# Patient Record
Sex: Male | Born: 1945
Health system: Southern US, Community
[De-identification: ages and names within clinical notes are randomized; demographics above are authoritative.]

## PROBLEM LIST (undated history)

## (undated) DIAGNOSIS — H409 Unspecified glaucoma: Secondary | ICD-10-CM

## (undated) DIAGNOSIS — M1990 Unspecified osteoarthritis, unspecified site: Secondary | ICD-10-CM

## (undated) DIAGNOSIS — I509 Heart failure, unspecified: Secondary | ICD-10-CM

## (undated) DIAGNOSIS — K222 Esophageal obstruction: Secondary | ICD-10-CM

## (undated) DIAGNOSIS — I700 Atherosclerosis of aorta: Secondary | ICD-10-CM

## (undated) DIAGNOSIS — I340 Nonrheumatic mitral (valve) insufficiency: Secondary | ICD-10-CM

## (undated) DIAGNOSIS — I219 Acute myocardial infarction, unspecified: Secondary | ICD-10-CM

## (undated) HISTORY — DX: Esophageal obstruction: K22.2

## (undated) HISTORY — DX: Unspecified glaucoma: H40.9

## (undated) HISTORY — DX: Atherosclerosis of aorta: I70.0

## (undated) HISTORY — DX: Unspecified osteoarthritis, unspecified site: M19.90

## (undated) HISTORY — DX: Nonrheumatic mitral (valve) insufficiency: I34.0

## (undated) HISTORY — PX: FACIAL FRACTURE SURGERY: SHX1570

## (undated) HISTORY — DX: Heart failure, unspecified: I50.9

## (undated) HISTORY — DX: Acute myocardial infarction, unspecified: I21.9

---

## 2020-03-27 ENCOUNTER — Encounter: Payer: Self-pay | Admitting: Family Medicine

## 2020-03-27 ENCOUNTER — Ambulatory Visit (INDEPENDENT_AMBULATORY_CARE_PROVIDER_SITE_OTHER): Payer: Medicare HMO | Admitting: Family Medicine

## 2020-03-27 ENCOUNTER — Other Ambulatory Visit: Payer: Self-pay

## 2020-03-27 VITALS — BP 106/50 | HR 80 | Temp 97.9°F | Resp 18 | Ht 71.0 in | Wt 233.2 lb

## 2020-03-27 DIAGNOSIS — R5383 Other fatigue: Secondary | ICD-10-CM | POA: Diagnosis not present

## 2020-03-27 DIAGNOSIS — L02419 Cutaneous abscess of limb, unspecified: Secondary | ICD-10-CM | POA: Diagnosis not present

## 2020-03-27 MED ORDER — CEFTRIAXONE SODIUM 1 G IJ SOLR
1.0000 g | Freq: Once | INTRAMUSCULAR | Status: AC
  Administered 2020-03-27: 1 g via INTRAMUSCULAR
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
## 2020-03-28 LAB — COMPREHENSIVE METABOLIC PANEL
ALT: 17 IU/L (ref 0–44)
AST: 18 IU/L (ref 0–40)
Albumin/Globulin Ratio: 1.2 (ref 1.2–2.2)
Albumin: 3.8 g/dL (ref 3.7–4.7)
Alkaline Phosphatase: 87 IU/L (ref 44–121)
BUN/Creatinine Ratio: 19 (ref 10–24)
BUN: 24 mg/dL (ref 8–27)
Bilirubin Total: 1.3 mg/dL — ABNORMAL HIGH (ref 0.0–1.2)
CO2: 21 mmol/L (ref 20–29)
Calcium: 8.8 mg/dL (ref 8.6–10.2)
Chloride: 98 mmol/L (ref 96–106)
Creatinine, Ser: 1.26 mg/dL (ref 0.76–1.27)
GFR calc Af Amer: 65 mL/min/{1.73_m2} (ref >59)
GFR calc non Af Amer: 57 mL/min/{1.73_m2} — ABNORMAL LOW (ref >59)
Globulin, Total: 3.2 g/dL (ref 1.5–4.5)
Glucose: 122 mg/dL — ABNORMAL HIGH (ref 65–99)
Potassium: 4.3 mmol/L (ref 3.5–5.2)
Sodium: 136 mmol/L (ref 134–144)
Total Protein: 7 g/dL (ref 6.0–8.5)

## 2020-03-28 LAB — CBC WITH DIFFERENTIAL/PLATELET
Basophils Absolute: 0.1 10*3/uL (ref 0.0–0.2)
Basos: 0 %
EOS (ABSOLUTE): 0 10*3/uL (ref 0.0–0.4)
Eos: 0 %
Hematocrit: 40 % (ref 37.5–51.0)
Hemoglobin: 13.3 g/dL (ref 13.0–17.7)
Immature Grans (Abs): 0.2 10*3/uL — ABNORMAL HIGH (ref 0.0–0.1)
Immature Granulocytes: 1 %
Lymphocytes Absolute: 0.5 10*3/uL — ABNORMAL LOW (ref 0.7–3.1)
Lymphs: 2 %
MCH: 29.9 pg (ref 26.6–33.0)
MCHC: 33.3 g/dL (ref 31.5–35.7)
MCV: 90 fL (ref 79–97)
Monocytes Absolute: 1.7 10*3/uL — ABNORMAL HIGH (ref 0.1–0.9)
Monocytes: 8 %
Neutrophils Absolute: 18.4 10*3/uL — ABNORMAL HIGH (ref 1.4–7.0)
Neutrophils: 89 %
Platelets: 294 10*3/uL (ref 150–450)
RBC: 4.45 x10E6/uL (ref 4.14–5.80)
RDW: 14.1 % (ref 11.6–15.4)
WBC: 20.9 10*3/uL (ref 3.4–10.8)

## 2020-03-28 LAB — TSH: TSH: 1.1 u[IU]/mL (ref 0.450–4.500)

## 2020-08-22 ENCOUNTER — Ambulatory Visit (INDEPENDENT_AMBULATORY_CARE_PROVIDER_SITE_OTHER): Payer: Medicare HMO | Admitting: Nurse Practitioner

## 2020-08-22 ENCOUNTER — Other Ambulatory Visit: Payer: Self-pay

## 2020-08-22 VITALS — BP 158/84 | HR 85 | Temp 97.4°F | Ht 74.0 in | Wt 233.0 lb

## 2020-08-22 DIAGNOSIS — M5441 Lumbago with sciatica, right side: Secondary | ICD-10-CM | POA: Diagnosis not present

## 2020-08-22 DIAGNOSIS — M545 Low back pain, unspecified: Secondary | ICD-10-CM | POA: Diagnosis not present

## 2020-08-22 DIAGNOSIS — R809 Proteinuria, unspecified: Secondary | ICD-10-CM | POA: Diagnosis not present

## 2020-08-22 DIAGNOSIS — R109 Unspecified abdominal pain: Secondary | ICD-10-CM | POA: Diagnosis not present

## 2020-08-22 LAB — POCT URINALYSIS DIPSTICK
Bilirubin, UA: NEGATIVE
Blood, UA: NEGATIVE
Glucose, UA: NEGATIVE
Ketones, UA: NEGATIVE
Leukocytes, UA: NEGATIVE
Nitrite, UA: NEGATIVE
Protein, UA: POSITIVE — AB
Spec Grav, UA: 1.025 (ref 1.010–1.025)
Urobilinogen, UA: NEGATIVE E.U./dL — AB
pH, UA: 6 (ref 5.0–8.0)

## 2020-08-22 MED ORDER — PREDNISONE 20 MG PO TABS: 20.0000 mg | ORAL_TABLET | Freq: Every day | ORAL | 0 refills | Status: DC

## 2020-08-22 MED ORDER — CYCLOBENZAPRINE HCL 10 MG PO TABS: 10.0000 mg | ORAL_TABLET | Freq: Three times a day (TID) | ORAL | 0 refills | Status: DC | PRN

## 2020-08-22 MED ORDER — KETOROLAC TROMETHAMINE 60 MG/2ML IM SOLN
60.0000 mg | Freq: Once | INTRAMUSCULAR | Status: AC
  Administered 2020-08-22: 60 mg via INTRAMUSCULAR
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
## 2020-08-22 NOTE — Patient Instructions (Addendum)
Toradol 60 mg injection given in office today Begin Prednisone as directed (Take 1 pill 3 times daily for 3 days, then 1 pill 2 times daily for 3 days, then 1 pill 3 times daily for 3 days) Take Flexeril  (muscle relaxers) up to 3 times daily as needed Obtain kidney and back x-rays at [facility] today Follow-up in 1 week May take Ibuprofen as needed  Acute Back Pain, Adult Acute back pain is sudden and usually short-lived. It is often caused by an injury to the muscles and tissues in the back. The injury may result from:  A muscle or ligament getting overstretched or torn (strained). Ligaments are tissues that connect bones to each other. Lifting something improperly can cause a back strain.  Wear and tear (degeneration) of the spinal disks. Spinal disks are circular tissue that provide cushioning between the bones of the spine (vertebrae).  Twisting motions, such as while playing sports or doing yard work.  A hit to the back.  Arthritis. You may have a physical exam, lab tests, and imaging tests to find the cause of your pain. Acute back pain usually goes away with rest and home care. Follow these instructions at home: Managing pain, stiffness, and swelling  Treatment may include medicines for pain and inflammation that are taken by mouth or applied to the skin, prescription pain medicine, or muscle relaxants. Take over-the-counter and prescription medicines only as told by your health care provider.  Your health care provider may recommend applying ice during the first 24-48 hours after your pain starts. To do this: ? Put ice in a plastic bag. ? Place a towel between your skin and the bag. ? Leave the ice on for 20 minutes, 2-3 times a day.  If directed, apply heat to the affected area as often as told by your health care provider. Use the heat source that your health care provider recommends, such as a moist heat pack or a heating pad. ? Place a towel between your skin and the heat  source. ? Leave the heat on for 20-30 minutes. ? Remove the heat if your skin turns bright red. This is especially important if you are unable to feel pain, heat, or cold. You have a greater risk of getting burned. Activity  Do not stay in bed. Staying in bed for more than 1-2 days can delay your recovery.  Sit up and stand up straight. Avoid leaning forward when you sit or hunching over when you stand. ? If you work at a desk, sit close to it so you do not need to lean over. Keep your chin tucked in. Keep your neck drawn back, and keep your elbows bent at a 90-degree angle (right angle). ? Sit high and close to the steering wheel when you drive. Add lower back (lumbar) support to your car seat, if needed.  Take short walks on even surfaces as soon as you are able. Try to increase the length of time you walk each day.  Do not sit, drive, or stand in one place for more than 30 minutes at a time. Sitting or standing for long periods of time can put stress on your back.  Do not drive or use heavy machinery while taking prescription pain medicine.  Use proper lifting techniques. When you bend and lift, use positions that put less stress on your back: ? [place] your knees. ? Keep the load close to your body. ? Avoid twisting.  Exercise regularly as told by your  health care provider. Exercising helps your back heal faster and helps prevent back injuries by keeping muscles strong and flexible.  Work with a physical therapist to make a safe exercise program, as recommended by your health care provider. Do any exercises as told by your physical therapist.   Lifestyle  Maintain a healthy weight. Extra weight puts stress on your back and makes it difficult to have good posture.  Avoid activities or situations that make you feel anxious or stressed. Stress and anxiety increase muscle tension and can make back pain worse. Learn ways to manage anxiety and stress, such as through exercise. General  instructions  Sleep on a firm mattress in a comfortable position. Try lying on your side with your knees slightly bent. If you lie on your back, put a pillow under your knees.  Follow your treatment plan as told by your health care provider. This may include: ? Cognitive or behavioral therapy. ? Acupuncture or massage therapy. ? Meditation or yoga. Contact a health care provider if:  You have pain that is not relieved with rest or medicine.  You have increasing pain going down into your legs or buttocks.  Your pain does not improve after 2 weeks.  You have pain at night.  You lose weight without trying.  You have a fever or chills. Get help right away if:  You develop new bowel or bladder control problems.  You have unusual weakness or numbness in your arms or legs.  You develop nausea or vomiting.  You develop abdominal pain.  You feel faint. Summary  Acute back pain is sudden and usually short-lived.  Use proper lifting techniques. When you bend and lift, use positions that put less stress on your back.  Take over-the-counter and prescription medicines and apply heat or ice as directed by your health care provider. This information is not intended to replace advice given to you by your health care provider. Make sure you discuss any questions you have with your health care provider. Document Revised: 02/17/2020 Document Reviewed: 02/17/2020 Elsevier Patient Education  2021 [employer] Back Exercises These exercises help to make your trunk and back strong. They also help to keep the lower back flexible. Doing these exercises can help to prevent back pain or lessen existing pain.  If you have back pain, try to do these exercises 2-3 times each day or as told by your doctor.  As you get better, do the exercises once each day. Repeat the exercises more often as told by your doctor.  To stop back pain from coming back, do the exercises once each day, or as told by  your doctor. Exercises Single knee to chest Do these steps 3-5 times in a row for each leg: 1. Lie on your back on a firm bed or the floor with your legs stretched out. 2. Bring one knee to your chest. 3. Grab your knee or thigh with both hands and hold them it in place. 4. Pull on your knee until you feel a gentle stretch in your lower back or buttocks. 5. Keep doing the stretch for 10-30 seconds. 6. Slowly let go of your leg and straighten it. Pelvic tilt Do these steps 5-10 times in a row: 1. Lie on your back on a firm bed or the floor with your legs stretched out. 2. Bend your knees so they point up to the ceiling. Your feet should be flat on the floor. 3. Tighten your lower belly (abdomen) muscles to press  your lower back against the floor. This will make your tailbone point up to the ceiling instead of pointing down to your feet or the floor. 4. Stay in this position for 5-10 seconds while you gently tighten your muscles and breathe evenly. Cat-cow Do these steps until your lower back bends more easily: 1. Get on your hands and knees on a firm surface. Keep your hands under your shoulders, and keep your knees under your hips. You may put padding under your knees. 2. Let your head hang down toward your chest. Tighten (contract) the muscles in your belly. Point your tailbone toward the floor so your lower back becomes rounded like the back of a cat. 3. Stay in this position for 5 seconds. 4. Slowly lift your head. Let the muscles of your belly relax. Point your tailbone up toward the ceiling so your back forms a sagging arch like the back of a cow. 5. Stay in this position for 5 seconds.   Press-ups Do these steps 5-10 times in a row: 1. Lie on your belly (face-down) on the floor. 2. Place your hands near your head, about shoulder-width apart. 3. While you keep your back relaxed and keep your hips on the floor, slowly straighten your arms to raise the top half of your body and lift  your shoulders. Do not use your back muscles. You may change where you place your hands in order to make yourself more comfortable. 4. Stay in this position for 5 seconds. 5. Slowly return to lying flat on the floor.   Bridges Do these steps 10 times in a row: 1. Lie on your back on a firm surface. 2. Bend your knees so they point up to the ceiling. Your feet should be flat on the floor. Your arms should be flat at your sides, next to your body. 3. Tighten your butt muscles and lift your butt off the floor until your waist is almost as high as your knees. If you do not feel the muscles working in your butt and the back of your thighs, slide your feet 1-2 inches farther away from your butt. 4. Stay in this position for 3-5 seconds. 5. Slowly lower your butt to the floor, and let your butt muscles relax. If this exercise is too easy, try doing it with your arms crossed over your chest.   Belly crunches Do these steps 5-10 times in a row: 1. Lie on your back on a firm bed or the floor with your legs stretched out. 2. Bend your knees so they point up to the ceiling. Your feet should be flat on the floor. 3. Cross your arms over your chest. 4. Tip your chin a little bit toward your chest but do not bend your neck. 5. Tighten your belly muscles and slowly raise your chest just enough to lift your shoulder blades a tiny bit off of the floor. Avoid raising your body higher than that, because it can put too much stress on your low back. 6. Slowly lower your chest and your head to the floor. Back lifts Do these steps 5-10 times in a row: 1. Lie on your belly (face-down) with your arms at your sides, and rest your forehead on the floor. 2. Tighten the muscles in your legs and your butt. 3. Slowly lift your chest off of the floor while you keep your hips on the floor. Keep the back of your head in line with the curve in your back. Look at  the floor while you do this. 4. Stay in this position for 3-5  seconds. 5. Slowly lower your chest and your face to the floor. Contact a doctor if:  Your back pain gets a lot worse when you do an exercise.  Your back pain does not get better 2 hours after you exercise. If you have any of these problems, stop doing the exercises. Do not do them again unless your doctor says it is okay. Get help right away if:  You have sudden, very bad back pain. If this happens, stop doing the exercises. Do not do them again unless your doctor says it is okay. This information is not intended to replace advice given to you by your health care provider. Make sure you discuss any questions you have with your health care provider. Document Revised: 02/18/2018 Document Reviewed: 02/18/2018 Elsevier Patient Education  2021 [employer]. Cyclobenzaprine tablets What is this medicine? CYCLOBENZAPRINE (sye kloe BEN za preen) is a muscle relaxer. It is used to treat muscle pain, spasms, and stiffness. This medicine may be used for other purposes; ask your health care provider or pharmacist if you have questions. COMMON BRAND NAME(S): Fexmid, Flexeril What should I tell my health care provider before I take this medicine? They need to know if you have any of these conditions:  heart disease, irregular heartbeat, or previous heart attack  liver disease  thyroid problem  an unusual or allergic reaction to cyclobenzaprine, tricyclic antidepressants, lactose, other medicines, foods, dyes, or preservatives  pregnant or trying to get pregnant  breast-feeding How should I use this medicine? Take this medicine by mouth with a glass of water. Follow the directions on the prescription label. If this medicine upsets your stomach, take it with food or milk. Take your medicine at regular intervals. Do not take it more often than directed. Talk to your pediatrician regarding the use of this medicine in children. Special care may be needed. Overdosage: If you think you have taken  too much of this medicine contact a poison control center or emergency room at once. NOTE: This medicine is only for you. Do not share this medicine with others. What if I miss a dose? If you miss a dose, take it as soon as you can. If it is almost time for your next dose, take only that dose. Do not take double or extra doses. What may interact with this medicine? Do not take this medicine with any of the following medications:  MAOIs like Carbex, Eldepryl, Marplan, Nardil, and Parnate  narcotic medicines for cough  safinamide This medicine may also interact with the following medications:  alcohol  bupropion  antihistamines for allergy, cough and cold  certain medicines for anxiety or sleep  certain medicines for bladder problems like oxybutynin, tolterodine  certain medicines for depression like amitriptyline, fluoxetine, sertraline  certain medicines for Parkinson's disease like benztropine, trihexyphenidyl  certain medicines for seizures like phenobarbital, primidone  certain medicines for stomach problems like dicyclomine, hyoscyamine  certain medicines for travel sickness like scopolamine  general anesthetics like halothane, isoflurane, methoxyflurane, propofol  ipratropium  local anesthetics like lidocaine, pramoxine, tetracaine  medicines that relax muscles for surgery  narcotic medicines for pain  phenothiazines like chlorpromazine, mesoridazine, prochlorperazine, thioridazine  verapamil This list may not describe all possible interactions. Give your health care provider a list of all the medicines, herbs, non-prescription drugs, or dietary supplements you use. Also tell them if you smoke, drink alcohol, or use illegal drugs. Some items  may interact with your medicine. What should I watch for while using this medicine? Tell your doctor or health care professional if your symptoms do not start to get better or if they get worse. You may get drowsy or dizzy.  Do not drive, use machinery, or do anything that needs mental alertness until you know how this medicine affects you. Do not stand or sit up quickly, especially if you are an older patient. This reduces the risk of dizzy or fainting spells. Alcohol may interfere with the effect of this medicine. Avoid alcoholic drinks. If you are taking another medicine that also causes drowsiness, you may have more side effects. Give your health care provider a list of all medicines you use. Your doctor will tell you how much medicine to take. Do not take more medicine than directed. Call emergency for help if you have problems breathing or unusual sleepiness. Your mouth may get dry. Chewing sugarless gum or sucking hard candy, and drinking plenty of water may help. Contact your doctor if the problem does not go away or is severe. What side effects may I notice from receiving this medicine? Side effects that you should report to your doctor or health care professional as soon as possible:  allergic reactions like skin rash, itching or hives, swelling of the face, lips, or tongue  breathing problems  chest pain  fast, irregular heartbeat  hallucinations  seizures  unusually weak or tired Side effects that usually do not require medical attention (report to your doctor or health care professional if they continue or are bothersome):  headache  nausea, vomiting This list may not describe all possible side effects. Call your doctor for medical advice about side effects. You may report side effects to FDA at 1-800-FDA-1088. Where should I keep my medicine? Keep out of the reach of children. Store at room temperature between 15 and 30 degrees C (59 and 86 degrees F). Keep container tightly closed. Throw away any unused medicine after the expiration date. NOTE: This sheet is a summary. It may not cover all possible information. If you have questions about this medicine, talk to your doctor, pharmacist, or  health care provider.  2021 Elsevier/Gold Standard (2018-04-28 12:49:26) Prednisone tablets What is this medicine? PREDNISONE (PRED ni sone) is a corticosteroid. It is commonly used to treat inflammation of the skin, joints, lungs, and other organs. Common conditions treated include asthma, allergies, and arthritis. It is also used for other conditions, such as blood disorders and diseases of the adrenal glands. This medicine may be used for other purposes; ask your health care provider or pharmacist if you have questions. COMMON BRAND NAME(S): Deltasone, Predone, Sterapred, Sterapred DS What should I tell my health care provider before I take this medicine? They need to know if you have any of these conditions:  Cushing's syndrome  diabetes  glaucoma  heart disease  high blood pressure  infection (especially a virus infection such as chickenpox, cold sores, or herpes)  kidney disease  liver disease  mental illness  myasthenia gravis  osteoporosis  seizures  stomach or intestine problems  thyroid disease  an unusual or allergic reaction to lactose, prednisone, other medicines, foods, dyes, or preservatives  pregnant or trying to get pregnant  breast-feeding How should I use this medicine? Take this medicine by mouth with a glass of water. Follow the directions on the prescription label. Take this medicine with food. If you are taking this medicine once a day, take it in the  morning. Do not take more medicine than you are told to take. Do not suddenly stop taking your medicine because you may develop a severe reaction. Your doctor will tell you how much medicine to take. If your doctor wants you to stop the medicine, the dose may be slowly lowered over time to avoid any side effects. Talk to your pediatrician regarding the use of this medicine in children. Special care may be needed. Overdosage: If you think you have taken too much of this medicine contact a poison  control center or emergency room at once. NOTE: This medicine is only for you. Do not share this medicine with others. What if I miss a dose? If you miss a dose, take it as soon as you can. If it is almost time for your next dose, talk to your doctor or health care professional. You may need to miss a dose or take an extra dose. Do not take double or extra doses without advice. What may interact with this medicine? Do not take this medicine with any of the following medications:  metyrapone  mifepristone This medicine may also interact with the following medications:  aminoglutethimide  amphotericin B  aspirin and aspirin-like medicines  barbiturates  certain medicines for diabetes, like glipizide or glyburide  cholestyramine  cholinesterase inhibitors  cyclosporine  digoxin  diuretics  ephedrine  male hormones, like estrogens and birth control pills  isoniazid  ketoconazole  NSAIDS, medicines for pain and inflammation, like ibuprofen or naproxen  phenytoin  rifampin  toxoids  vaccines  warfarin This list may not describe all possible interactions. Give your health care provider a list of all the medicines, herbs, non-prescription drugs, or dietary supplements you use. Also tell them if you smoke, drink alcohol, or use illegal drugs. Some items may interact with your medicine. What should I watch for while using this medicine? Visit your doctor or health care professional for regular checks on your progress. If you are taking this medicine over a prolonged period, carry an identification card with your name and address, the type and dose of your medicine, and your doctor's name and address. This medicine may increase your risk of getting an infection. Tell your doctor or health care professional if you are around anyone with measles or chickenpox, or if you develop sores or blisters that do not heal properly. If you are going to have surgery, tell your doctor  or health care professional that you have taken this medicine within the last twelve months. Ask your doctor or health care professional about your diet. You may need to lower the amount of salt you eat. This medicine may increase blood sugar. Ask your healthcare provider if changes in diet or medicines are needed if you have diabetes. What side effects may I notice from receiving this medicine? Side effects that you should report to your doctor or health care professional as soon as possible:  allergic reactions like skin rash, itching or hives, swelling of the face, lips, or tongue  changes in emotions or moods  changes in vision  depressed mood  eye pain  fever or chills, cough, sore throat, pain or difficulty passing urine  signs and symptoms of high blood sugar such as being more thirsty or hungry or having to urinate more than normal. You may also feel very tired or have blurry vision.  swelling of ankles, feet Side effects that usually do not require medical attention (report to your doctor or health care professional if they  continue or are bothersome):  confusion, excitement, restlessness  headache  nausea, vomiting  skin problems, acne, thin and shiny skin  trouble sleeping  weight gain This list may not describe all possible side effects. Call your doctor for medical advice about side effects. You may report side effects to FDA at 1-800-FDA-1088. Where should I keep my medicine? Keep out of the reach of children. Store at room temperature between 15 and 30 degrees C (59 and 86 degrees F). Protect from light. Keep container tightly closed. Throw away any unused medicine after the expiration date. NOTE: This sheet is a summary. It may not cover all possible information. If you have questions about this medicine, talk to your doctor, pharmacist, or health care provider.  2021 Elsevier/Gold Standard (2018-02-23 10:54:22)

## 2020-08-23 ENCOUNTER — Other Ambulatory Visit: Payer: Self-pay | Admitting: Nurse Practitioner

## 2020-08-23 ENCOUNTER — Encounter: Payer: Self-pay | Admitting: Nurse Practitioner

## 2020-08-23 DIAGNOSIS — R809 Proteinuria, unspecified: Secondary | ICD-10-CM

## 2020-08-23 LAB — COMPREHENSIVE METABOLIC PANEL
ALT: 11 IU/L (ref 0–44)
AST: 16 IU/L (ref 0–40)
Albumin/Globulin Ratio: 1.6 (ref 1.2–2.2)
Albumin: 4.5 g/dL (ref 3.7–4.7)
Alkaline Phosphatase: 72 IU/L (ref 44–121)
BUN/Creatinine Ratio: 12 (ref 10–24)
BUN: 10 mg/dL (ref 8–27)
Bilirubin Total: 0.5 mg/dL (ref 0.0–1.2)
CO2: 23 mmol/L (ref 20–29)
Calcium: 9.5 mg/dL (ref 8.6–10.2)
Chloride: 102 mmol/L (ref 96–106)
Creatinine, Ser: 0.82 mg/dL (ref 0.76–1.27)
Globulin, Total: 2.9 g/dL (ref 1.5–4.5)
Glucose: 117 mg/dL — ABNORMAL HIGH (ref 65–99)
Potassium: 4 mmol/L (ref 3.5–5.2)
Sodium: 141 mmol/L (ref 134–144)
Total Protein: 7.4 g/dL (ref 6.0–8.5)
eGFR: 93 mL/min/{1.73_m2} (ref >59)

## 2020-08-23 LAB — CBC WITH DIFFERENTIAL/PLATELET
Basophils Absolute: 0 10*3/uL (ref 0.0–0.2)
Basos: 1 %
EOS (ABSOLUTE): 0 10*3/uL (ref 0.0–0.4)
Eos: 1 %
Hematocrit: 42 % (ref 37.5–51.0)
Hemoglobin: 14.3 g/dL (ref 13.0–17.7)
Immature Grans (Abs): 0 10*3/uL (ref 0.0–0.1)
Immature Granulocytes: 0 %
Lymphocytes Absolute: 1.6 10*3/uL (ref 0.7–3.1)
Lymphs: 18 %
MCH: 29.6 pg (ref 26.6–33.0)
MCHC: 34 g/dL (ref 31.5–35.7)
MCV: 87 fL (ref 79–97)
Monocytes Absolute: 0.7 10*3/uL (ref 0.1–0.9)
Monocytes: 7 %
Neutrophils Absolute: 6.4 10*3/uL (ref 1.4–7.0)
Neutrophils: 73 %
Platelets: 303 10*3/uL (ref 150–450)
RBC: 4.83 x10E6/uL (ref 4.14–5.80)
RDW: 14.8 % (ref 11.6–15.4)
WBC: 8.8 10*3/uL (ref 3.4–10.8)
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
## 2020-08-30 ENCOUNTER — Other Ambulatory Visit: Payer: Self-pay

## 2020-08-30 ENCOUNTER — Ambulatory Visit (INDEPENDENT_AMBULATORY_CARE_PROVIDER_SITE_OTHER): Payer: Medicare HMO | Admitting: Nurse Practitioner

## 2020-08-30 ENCOUNTER — Encounter: Payer: Self-pay | Admitting: Nurse Practitioner

## 2020-08-30 VITALS — BP 136/78 | HR 93 | Temp 97.4°F | Ht 71.0 in | Wt 224.0 lb

## 2020-08-30 DIAGNOSIS — M5441 Lumbago with sciatica, right side: Secondary | ICD-10-CM

## 2020-08-30 DIAGNOSIS — M1990 Unspecified osteoarthritis, unspecified site: Secondary | ICD-10-CM | POA: Diagnosis not present

## 2020-08-30 DIAGNOSIS — Z2821 Immunization not carried out because of patient refusal: Secondary | ICD-10-CM

## 2020-08-30 DIAGNOSIS — Z5320 Procedure and treatment not carried out because of patient's decision for unspecified reasons: Secondary | ICD-10-CM | POA: Diagnosis not present

## 2020-08-30 DIAGNOSIS — M25361 Other instability, right knee: Secondary | ICD-10-CM

## 2020-08-30 DIAGNOSIS — M419 Scoliosis, unspecified: Secondary | ICD-10-CM

## 2020-08-30 DIAGNOSIS — I700 Atherosclerosis of aorta: Secondary | ICD-10-CM | POA: Diagnosis not present

## 2020-08-30 DIAGNOSIS — W19XXXA Unspecified fall, initial encounter: Secondary | ICD-10-CM | POA: Diagnosis not present

## 2020-08-30 MED ORDER — MELOXICAM 7.5 MG PO TABS: 7.5000 mg | ORAL_TABLET | Freq: Two times a day (BID) | ORAL | 0 refills | Status: DC
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
## 2020-08-30 NOTE — Patient Instructions (Addendum)
Begin Mobic 7.5 mg twice daily with food We will call you with orthopedic appointment Wear loose pants to orthopedic appointment for evaluation of knee and hip Follow-up in 4-weeks Walk with cane to prevent falling  Scoliosis  Scoliosis is a condition in which the spine curves sideways. Normally, the spine does not curve side-to-side (laterally). With scoliosis, the spine may curve to the left, to the right, or in both directions. The curve of the spine is measured by angles in degrees. Scoliosis can affect people at any age, but it is more common among children and adolescents. What are the causes? The cause of scoliosis is not always known. It may be caused by:  A birth defect.  A disease that can cause problems in the muscles or imbalance of the body, such as cerebral palsy or muscular dystrophy. What are the signs or symptoms? This condition may not cause any symptoms. If you do have symptoms, they may include:  Leaning to one side.  Sunken chest and uneven shoulders.  One side of the body being different or larger than the other side (asymmetry).  An abnormal curve in the back.  Pain, which may limit physical activity.  Shortness of breath.  Bowel or bladder control problems, such as not knowing when you have to go. This can be a sign of nerve damage.   How is this diagnosed? This condition is diagnosed based on:  Your medical history.  Your symptoms.  A physical exam. This may include: ? Examining your nerves, muscles, and reflexes (neurological exam). ? Testing the movement of your spine (range of motion study).  Imaging tests, such as: ? X-rays. ? MRI. How is this treated? Treatment for this condition depends on the severity of the symptoms. Treatment may include:  Observation to make sure that your scoliosis does not get worse (progress). You may need to have regular visits with your health care provider.  A back brace to prevent scoliosis from progressing.  This may be needed during times of fast growth (growth spurts), such as during adolescence.  Medicine to help relieve pain.  Physical therapy.  Surgery.   Follow these instructions at home: If you have a brace:  Wear the brace as told by your health care provider. Remove it only as told by your health care provider.  Loosen the brace if your fingers or toes tingle, become numb, or turn cold and blue.  Keep the brace clean.  If the brace is not waterproof: ? Do not let it get wet. ? Cover it with a watertight covering when you take a bath or a shower. General instructions  Take over-the-counter and prescription medicines only as told by your health care provider.  Donot drive or use heavy machinery while taking prescription pain medicine.  If physical therapy was prescribed, do exercises as instructed.  Before starting any new sports or physical activities, ask your health care provider whether they are safe for you.  Keep all follow-up visits as told by your health care provider. This is important. Contact a health care provider if you have:  Problems with your back brace, such as skin irritation or discomfort.  Back pain that does not get better with medicine. Get help right away if:  Your legs feel weak.  You cannot move your legs.  You cannot control when you urinate or pass stool (loss of bladder or bowel control). Summary  Scoliosis is a condition of having a spine that curves sideways. The spine may  curve to the left, to the right, or in both directions.  This condition may be caused by birth defects or diseases that affect muscles and body balance.  Follow your health care provider's instructions about wearing a brace, doing physical activities, and keeping follow-up visits. This information is not intended to replace advice given to you by your health care provider. Make sure you discuss any questions you have with your health care provider. Document  Revised: 10/26/2017 Document Reviewed: 09/10/2017 Elsevier Patient Education  2021 [employer].   Fall Prevention in the Home, Adult Falls can cause injuries and can happen to people of all ages. There are many things you can do to make your home safe and to help prevent falls. Ask for help when making these changes. What actions can I take to prevent falls? General Instructions  Use good lighting in all rooms. Replace any light bulbs that burn out.  Turn on the lights in dark areas. Use night-lights.  Keep items that you use often in easy-to-reach places. Lower the shelves around your home if needed.  Set up your furniture so you have a clear path. Avoid moving your furniture around.  Do not have throw rugs or other things on the floor that can make you trip.  Avoid walking on wet floors.  If any of your floors are uneven, fix them.  Add color or contrast paint or tape to clearly mark and help you see: ? Grab bars or handrails. ? First and last steps of staircases. ? Where the edge of each step is.  If you use a stepladder: ? Make sure that it is fully opened. Do not climb a closed stepladder. ? Make sure the sides of the stepladder are locked in place. ? Ask someone to hold the stepladder while you use it.  Know where your pets are when moving through your home. What can I do in the bathroom?  Keep the floor dry. Clean up any water on the floor right away.  Remove soap buildup in the tub or shower.  Use nonskid mats or decals on the floor of the tub or shower.  Attach bath mats securely with double-sided, nonslip rug tape.  If you need to sit down in the shower, use a plastic, nonslip stool.  Install grab bars by the toilet and in the tub and shower. Do not use towel bars as grab bars.      What can I do in the bedroom?  Make sure that you have a light by your bed that is easy to reach.  Do not use any sheets or blankets for your bed that hang to the  floor.  Have a firm chair with side arms that you can use for support when you get dressed. What can I do in the kitchen?  Clean up any spills right away.  If you need to reach something above you, use a step stool with a grab bar.  Keep electrical cords out of the way.  Do not use floor polish or wax that makes floors slippery. What can I do with my stairs?  Do not leave any items on the stairs.  Make sure that you have a light switch at the top and the bottom of the stairs.  Make sure that there are handrails on both sides of the stairs. Fix handrails that are broken or loose.  Install nonslip stair treads on all your stairs.  Avoid having throw rugs at the top or bottom  of the stairs.  Choose a carpet that does not hide the edge of the steps on the stairs.  Check carpeting to make sure that it is firmly attached to the stairs. Fix carpet that is loose or worn. What can I do on the outside of my home?  Use bright outdoor lighting.  Fix the edges of walkways and driveways and fix any cracks.  Remove anything that might make you trip as you walk through a door, such as a raised step or threshold.  Trim any bushes or trees on paths to your home.  Check to see if handrails are loose or broken and that both sides of all steps have handrails.  Install guardrails along the edges of any raised decks and porches.  Clear paths of anything that can make you trip, such as tools or rocks.  Have leaves, snow, or ice cleared regularly.  Use sand or salt on paths during winter.  Clean up any spills in your garage right away. This includes grease or oil spills. What other actions can I take?  Wear shoes that: ? Have a low heel. Do not wear high heels. ? Have rubber bottoms. ? Feel good on your feet and fit well. ? Are closed at the toe. Do not wear open-toe sandals.  Use tools that help you move around if needed. These  include: ? Canes. ? Walkers. ? Scooters. ? Crutches.  Review your medicines with your doctor. Some medicines can make you feel dizzy. This can increase your chance of falling. Ask your doctor what else you can do to help prevent falls. Where to find more information  Centers for Disease Control and Prevention, [employer]: FootballExhibition.com.br  [employer] on Aging: https://walker.com/ Contact a doctor if:  You are afraid of falling at home.  You feel weak, drowsy, or dizzy at home.  You fall at home. Summary  There are many simple things that you can do to make your home safe and to help prevent falls.  Ways to make your home safe include removing things that can make you trip and installing grab bars in the bathroom.  Ask for help when making these changes in your home. This information is not intended to replace advice given to you by your health care provider. Make sure you discuss any questions you have with your health care provider. Document Revised: 12/28/2019 Document Reviewed: 12/28/2019 Elsevier Patient Education  2021 [employer]. Journal for Nurse Practitioners, 15(4), [phone]. Retrieved March 15, 2018 from http://clinicalkey.com/nursing">  Knee Exercises Ask your health care provider which exercises are safe for you. Do exercises exactly as told by your health care provider and adjust them as directed. It is normal to feel mild stretching, pulling, tightness, or discomfort as you do these exercises. Stop right away if you feel sudden pain or your pain gets worse. Do not begin these exercises until told by your health care provider. Stretching and range-of-motion exercises These exercises warm up your muscles and joints and improve the movement and flexibility of your knee. These exercises also help to relieve pain and swelling. Knee extension, prone 1. Lie on your abdomen (prone position) on a bed. 2. Place your left / right knee just beyond the edge of the surface so your  knee is not on the bed. You can put a towel under your left / right thigh just above your kneecap for comfort. 3. Relax your leg muscles and allow gravity to straighten your knee (extension). You should feel a  stretch behind your left / right knee. 4. Hold this position for __________ seconds. 5. Scoot up so your knee is supported between repetitions. Repeat __________ times. Complete this exercise __________ times a day. Knee flexion, active 1. Lie on your back with both legs straight. If this causes back discomfort, bend your left / right knee so your foot is flat on the floor. 2. Slowly slide your left / right heel back toward your buttocks. Stop when you feel a gentle stretch in the front of your knee or thigh (flexion). 3. Hold this position for __________ seconds. 4. Slowly slide your left / right heel back to the starting position. Repeat __________ times. Complete this exercise __________ times a day.   Quadriceps stretch, prone 1. Lie on your abdomen on a firm surface, such as a bed or padded floor. 2. Bend your left / right knee and hold your ankle. If you cannot reach your ankle or pant leg, loop a belt around your foot and grab the belt instead. 3. Gently pull your heel toward your buttocks. Your knee should not slide out to the side. You should feel a stretch in the front of your thigh and knee (quadriceps). 4. Hold this position for __________ seconds. Repeat __________ times. Complete this exercise __________ times a day.   Hamstring, supine 1. Lie on your back (supine position). 2. Loop a belt or towel over the ball of your left / right foot. The ball of your foot is on the walking surface, right under your toes. 3. Straighten your left / right knee and slowly pull on the belt to raise your leg until you feel a gentle stretch behind your knee (hamstring). ? Do not let your knee bend while you do this. ? Keep your other leg flat on the floor. 4. Hold this position for __________  seconds. Repeat __________ times. Complete this exercise __________ times a day. Strengthening exercises These exercises build strength and endurance in your knee. Endurance is the ability to use your muscles for a long time, even after they get tired. Quadriceps, isometric This exercise stretches the muscles in front of your thigh (quadriceps) without moving your knee joint (isometric). 1. Lie on your back with your left / right leg extended and your other knee bent. Put a rolled towel or small pillow under your knee if told by your health care provider. 2. Slowly tense the muscles in the front of your left / right thigh. You should see your kneecap slide up toward your hip or see increased dimpling just above the knee. This motion will push the back of the knee toward the floor. 3. For __________ seconds, hold the muscle as tight as you can without increasing your pain. 4. Relax the muscles slowly and completely. Repeat __________ times. Complete this exercise __________ times a day.   Straight leg raises This exercise stretches the muscles in front of your thigh (quadriceps) and the muscles that move your hips (hip flexors). 1. Lie on your back with your left / right leg extended and your other knee bent. 2. Tense the muscles in the front of your left / right thigh. You should see your kneecap slide up or see increased dimpling just above the knee. Your thigh may even shake a bit. 3. Keep these muscles tight as you raise your leg 4-6 inches (10-15 cm) off the floor. Do not let your knee bend. 4. Hold this position for __________ seconds. 5. Keep these muscles tense as you  lower your leg. 6. Relax your muscles slowly and completely after each repetition. Repeat __________ times. Complete this exercise __________ times a day. Hamstring, isometric 1. Lie on your back on a firm surface. 2. Bend your left / right knee about __________ degrees. 3. Dig your left / right heel into the surface as if  you are trying to pull it toward your buttocks. Tighten the muscles in the back of your thighs (hamstring) to "dig" as hard as you can without increasing any pain. 4. Hold this position for __________ seconds. 5. Release the tension gradually and allow your muscles to relax completely for __________ seconds after each repetition. Repeat __________ times. Complete this exercise __________ times a day. Hamstring curls If told by your health care provider, do this exercise while wearing ankle weights. Begin with __________ lb weights. Then increase the weight by 1 lb (0.5 kg) increments. Do not wear ankle weights that are more than __________ lb. 1. Lie on your abdomen with your legs straight. 2. Bend your left / right knee as far as you can without feeling pain. Keep your hips flat against the floor. 3. Hold this position for __________ seconds. 4. Slowly lower your leg to the starting position. Repeat __________ times. Complete this exercise __________ times a day.   Squats This exercise strengthens the muscles in front of your thigh and knee (quadriceps). 1. Stand in front of a table, with your feet and knees pointing straight ahead. You may rest your hands on the table for balance but not for support. 2. Slowly bend your knees and lower your hips like you are going to sit in a chair. ? Keep your weight over your heels, not over your toes. ? Keep your lower legs upright so they are parallel with the table legs. ? Do not let your hips go lower than your knees. ? Do not bend lower than told by your health care provider. ? If your knee pain increases, do not bend as low. 3. Hold the squat position for __________ seconds. 4. Slowly push with your legs to return to standing. Do not use your hands to pull yourself to standing. Repeat __________ times. Complete this exercise __________ times a day. Wall slides This exercise strengthens the muscles in front of your thigh and knee  (quadriceps). 1. Lean your back against a smooth wall or door, and walk your feet out 18-24 inches (46-61 cm) from it. 2. Place your feet hip-width apart. 3. Slowly slide down the wall or door until your knees bend __________ degrees. Keep your knees over your heels, not over your toes. Keep your knees in line with your hips. 4. Hold this position for __________ seconds. Repeat __________ times. Complete this exercise __________ times a day.   Straight leg raises This exercise strengthens the muscles that rotate the leg at the hip and move it away from your body (hip abductors). 1. Lie on your side with your left / right leg in the top position. Lie so your head, shoulder, knee, and hip line up. You may bend your bottom knee to help you keep your balance. 2. Roll your hips slightly forward so your hips are stacked directly over each other and your left / right knee is facing forward. 3. Leading with your heel, lift your top leg 4-6 inches (10-15 cm). You should feel the muscles in your outer hip lifting. ? Do not let your foot drift forward. ? Do not let your knee roll toward the  ceiling. 4. Hold this position for __________ seconds. 5. Slowly return your leg to the starting position. 6. Let your muscles relax completely after each repetition. Repeat __________ times. Complete this exercise __________ times a day.   Straight leg raises This exercise stretches the muscles that move your hips away from the front of the pelvis (hip extensors). 1. Lie on your abdomen on a firm surface. You can put a pillow under your hips if that is more comfortable. 2. Tense the muscles in your buttocks and lift your left / right leg about 4-6 inches (10-15 cm). Keep your knee straight as you lift your leg. 3. Hold this position for __________ seconds. 4. Slowly lower your leg to the starting position. 5. Let your leg relax completely after each repetition. Repeat __________ times. Complete this exercise  __________ times a day. This information is not intended to replace advice given to you by your health care provider. Make sure you discuss any questions you have with your health care provider. Document Revised: 03/16/2018 Document Reviewed: 03/16/2018 Elsevier Patient Education  2021 [employer] Meloxicam tablets What is this medicine? MELOXICAM (mel OX i cam) is a non-steroidal anti-inflammatory drug, also known as an NSAID. It is used to treat pain, inflammation, and swelling. This medicine may be used for other purposes; ask your health care provider or pharmacist if you have questions. COMMON BRAND NAME(S): Mobic What should I tell my health care provider before I take this medicine? They need to know if you have any of these conditions:  asthma (lung or breathing disease)  bleeding disorder  coronary artery bypass graft (CABG) within the past 2 weeks  dehydration  heart attack  heart disease  heart failure  high blood pressure  if you often drink alcohol  kidney disease  liver disease  smoke tobacco cigarettes  stomach bleeding  stomach ulcers, other stomach or intestine problems  take medicines that treat or prevent blood clots  taking other steroids like dexamethasone or prednisone  an unusual or allergic reaction to meloxicam, other medicines, foods, dyes, or preservatives  pregnant or trying to get pregnant  breast-feeding How should I use this medicine? Take this medicine by mouth. Take it as directed on the prescription label at the same time every day. You can take it with or without food. If it upsets your stomach, take it with food. Do not use it more often than directed. There may be unused or extra doses in the bottle after you finish your treatment. Talk to your health care provider if you have questions about your dose. A special MedGuide will be given to you by the pharmacist with each prescription and refill. Be sure to read this information  carefully each time. Talk to your health care provider about the use of this medicine in children. Special care may be needed. Patients over 42 years of age may have a stronger reaction and need a smaller dose. Overdosage: If you think you have taken too much of this medicine contact a poison control center or emergency room at once. NOTE: This medicine is only for you. Do not share this medicine with others. What if I miss a dose? If you miss a dose, take it as soon as you can. If it is almost time for your next dose, take only that dose. Do not take double or extra doses. What may interact with this medicine? Do not take this medicine with any of the following medications:  cidofovir  ketorolac  This medicine may also interact with the following medications:  aspirin and aspirin-like medicines  certain medicines for blood pressure, heart disease, irregular heart beat  certain medicines for depression, anxiety, or psychotic disturbances  certain medicines that treat or prevent blood clots like warfarin, enoxaparin, dalteparin, apixaban, dabigatran, rivaroxaban  cyclosporine  diuretics  fluconazole  lithium  methotrexate  other NSAIDs, medicines for pain and inflammation, like ibuprofen and naproxen  pemetrexed This list may not describe all possible interactions. Give your health care provider a list of all the medicines, herbs, non-prescription drugs, or dietary supplements you use. Also tell them if you smoke, drink alcohol, or use illegal drugs. Some items may interact with your medicine. What should I watch for while using this medicine? Visit your health care provider for regular checks on your progress. Tell your health care provider if your symptoms do not start to get better or if they get worse. Do not take other medicines that contain aspirin, ibuprofen, or naproxen with this medicine. Side effects such as stomach upset, nausea, or ulcers may be more likely to occur.  Many non-prescription medicines contain aspirin, ibuprofen, or naproxen. Always read labels carefully. This medicine can cause serious ulcers and bleeding in the stomach. It can happen with no warning. Smoking, drinking alcohol, older age, and poor health can also increase risks. Call your health care provider right away if you have stomach pain or blood in your vomit or stool. This medicine does not prevent a heart attack or stroke. This medicine may increase the chance of a heart attack or stroke. The chance may increase the longer you use this medicine or if you have heart disease. If you take aspirin to prevent a heart attack or stroke, talk to your health care provider about using this medicine. Alcohol may interfere with the effect of this medicine. Avoid alcoholic drinks. This medicine may cause serious skin reactions. They can happen weeks to months after starting the medicine. Contact your health care provider right away if you notice fevers or flu-like symptoms with a rash. The rash may be red or purple and then turn into blisters or peeling of the skin. Or, you might notice a red rash with swelling of the face, lips or lymph nodes in your neck or under your arms. Talk to your health care provider if you are pregnant before taking this medicine. Taking this medicine between weeks 20 and 30 of pregnancy may harm your unborn baby. Your health care provider will monitor you closely if you need to take it. After 30 weeks of pregnancy, do not take this medicine. You may get drowsy or dizzy. Do not drive, use machinery, or do anything that needs mental alertness until you know how this medicine affects you. Do not stand up or sit up quickly, especially if you are an older patient. This reduces the risk of dizzy or fainting spells. Be careful brushing or flossing your teeth or using a toothpick because you may get an infection or bleed more easily. If you have any dental work done, tell your dentist you  are receiving this medicine. This medicine may make it more difficult to get pregnant. Talk to your health care provider if you are concerned about your fertility. What side effects may I notice from receiving this medicine? Side effects that you should report to your doctor or health care professional as soon as possible:  allergic reactions (skin rash, itching or hives; swelling of the face, lips, or tongue)  bleeding (bloody or black, tarry stools; red or dark brown urine; spitting up blood or brown material that looks like coffee grounds; red spots on the skin; unusual bruising or bleeding from the eyes, gums, or nose)  blood clot (chest pain; shortness of breath; pain, swelling, or warmth in the leg)  general ill feeling or flu-like symptoms  high potassium levels (chest pain; fast, irregular heartbeat; muscle weakness)  kidney injury (trouble passing urine or change in the amount of urine)  light-colored stool  liver injury (dark yellow or brown urine; general ill feeling or flu-like symptoms; loss of appetite, right upper belly pain; unusually weak or tired, yellowing of the eyes or skin)  low red blood cell counts (trouble breathing; feeling faint; lightheaded, falls; unusually weak or tired)  rash, fever, and swollen lymph nodes  redness, blistering, peeling, or loosening of the skin, including inside the mouth  stroke (changes in vision; confusion; trouble speaking or understanding; severe headaches; sudden numbness or weakness of the face, arm or leg; trouble walking; dizziness; loss of balance or coordination) Side effects that usually do not require medical attention (report to your doctor or health care professional if they continue or are bothersome):  constipation  diarrhea  dizziness  gas  headache  nausea, vomiting This list may not describe all possible side effects. Call your doctor for medical advice about side effects. You may report side effects to FDA  at 1-800-FDA-1088. Where should I keep my medicine? Keep out of the reach of children and pets. Store at room temperature between 20 and 25 degrees C (68 and 77 degrees F). Protect from moisture. Keep the container tightly closed. Get rid of any unused medicine after the expiration date. To get rid of medicines that are no longer needed or have expired:  Take the medicine to a medicine take-back program. Check with your pharmacy or law enforcement to find a location.  If you cannot return the medicine, check the label or package insert to see if the medicine should be thrown out in the garbage or flushed down the toilet. If you are not sure, ask your health care provider. If it is safe to put it in the trash, empty the medicine out of the container. Mix the medicine with cat litter, dirt, coffee grounds, or other unwanted substance. Seal the mixture in a bag or container. Put it in the trash. NOTE: This sheet is a summary. It may not cover all possible information. If you have questions about this medicine, talk to your doctor, pharmacist, or health care provider.  2021 Elsevier/Gold Standard (2020-05-07 15:14:36)

## 2020-08-31 ENCOUNTER — Ambulatory Visit: Payer: Medicare HMO

## 2020-08-31 DIAGNOSIS — I700 Atherosclerosis of aorta: Secondary | ICD-10-CM

## 2020-08-31 LAB — PSA: Prostate Specific Ag, Serum: 0.7 ng/mL (ref 0.0–4.0)

## 2020-09-01 LAB — LIPID PANEL
Chol/HDL Ratio: 2.9 ratio (ref 0.0–5.0)
Cholesterol, Total: 162 mg/dL (ref 100–199)
HDL: 56 mg/dL (ref >39)
LDL Chol Calc (NIH): 93 mg/dL (ref 0–99)
Triglycerides: 69 mg/dL (ref 0–149)
VLDL Cholesterol Cal: 13 mg/dL (ref 5–40)

## 2020-09-01 LAB — CARDIOVASCULAR RISK ASSESSMENT

## 2020-09-06 ENCOUNTER — Ambulatory Visit (INDEPENDENT_AMBULATORY_CARE_PROVIDER_SITE_OTHER): Payer: Medicare HMO | Admitting: Nurse Practitioner

## 2020-09-06 ENCOUNTER — Telehealth: Payer: Self-pay

## 2020-09-06 ENCOUNTER — Other Ambulatory Visit: Payer: Self-pay

## 2020-09-06 ENCOUNTER — Encounter: Payer: Self-pay | Admitting: Nurse Practitioner

## 2020-09-06 VITALS — BP 144/90 | HR 83 | Temp 98.7°F | Resp 18 | Ht 71.0 in | Wt 226.4 lb

## 2020-09-06 DIAGNOSIS — R296 Repeated falls: Secondary | ICD-10-CM

## 2020-09-06 DIAGNOSIS — M5441 Lumbago with sciatica, right side: Secondary | ICD-10-CM | POA: Diagnosis not present

## 2020-09-06 DIAGNOSIS — W19XXXA Unspecified fall, initial encounter: Secondary | ICD-10-CM | POA: Diagnosis not present

## 2020-09-06 LAB — POCT URINALYSIS DIP (CLINITEK)
Bilirubin, UA: NEGATIVE
Blood, UA: NEGATIVE
Glucose, UA: NEGATIVE mg/dL
Ketones, POC UA: NEGATIVE mg/dL
Leukocytes, UA: NEGATIVE
Nitrite, UA: NEGATIVE
POC PROTEIN,UA: NEGATIVE
Spec Grav, UA: 1.015 (ref 1.010–1.025)
Urobilinogen, UA: 0.2 E.U./dL
pH, UA: 6.5 (ref 5.0–8.0)

## 2020-09-06 MED ORDER — KETOROLAC TROMETHAMINE 60 MG/2ML IM SOLN
60.0000 mg | Freq: Once | INTRAMUSCULAR | Status: AC
  Administered 2020-09-06: 60 mg via INTRAMUSCULAR

## 2020-09-06 MED ORDER — GABAPENTIN 300 MG PO CAPS: 300.0000 mg | ORAL_CAPSULE | Freq: Every day | ORAL | 0 refills | Status: DC

## 2020-09-06 MED ORDER — TRIAMCINOLONE ACETONIDE 40 MG/ML IJ SUSP
60.0000 mg | Freq: Once | INTRAMUSCULAR | Status: AC
  Administered 2020-09-06: 60 mg via INTRAMUSCULAR

## 2020-09-06 MED ORDER — TRAMADOL HCL 50 MG PO TABS: 50.0000 mg | ORAL_TABLET | Freq: Four times a day (QID) | ORAL | 0 refills | Status: AC | PRN

## 2020-09-06 NOTE — Patient Instructions (Signed)
Toradol 60 mg injection and Kenalog 60 mg injection given in office Tramadol 50 mg every 6 hours as needed for pain Gabapentin 300 mg at bedtime for pain We will call you with order for back MRI Keep follow-up with orthopedic doctor as scheduled Use cane for walking, fall precautions  Acute Back Pain, Adult Acute back pain is sudden and usually short-lived. It is often caused by an injury to the muscles and tissues in the back. The injury may result from:  A muscle or ligament getting overstretched or torn (strained). Ligaments are tissues that connect bones to each other. Lifting something improperly can cause a back strain.  Wear and tear (degeneration) of the spinal disks. Spinal disks are circular tissue that provide cushioning between the bones of the spine (vertebrae).  Twisting motions, such as while playing sports or doing yard work.  A hit to the back.  Arthritis. You may have a physical exam, lab tests, and imaging tests to find the cause of your pain. Acute back pain usually goes away with rest and home care. Follow these instructions at home: Managing pain, stiffness, and swelling  Treatment may include medicines for pain and inflammation that are taken by mouth or applied to the skin, prescription pain medicine, or muscle relaxants. Take over-the-counter and prescription medicines only as told by your health care provider.  Your health care provider may recommend applying ice during the first 24-48 hours after your pain starts. To do this: ? Put ice in a plastic bag. ? Place a towel between your skin and the bag. ? Leave the ice on for 20 minutes, 2-3 times a day.  If directed, apply heat to the affected area as often as told by your health care provider. Use the heat source that your health care provider recommends, such as a moist heat pack or a heating pad. ? Place a towel between your skin and the heat source. ? Leave the heat on for 20-30 minutes. ? Remove the heat  if your skin turns bright red. This is especially important if you are unable to feel pain, heat, or cold. You have a greater risk of getting burned. Activity  Do not stay in bed. Staying in bed for more than 1-2 days can delay your recovery.  Sit up and stand up straight. Avoid leaning forward when you sit or hunching over when you stand. ? If you work at a desk, sit close to it so you do not need to lean over. Keep your chin tucked in. Keep your neck drawn back, and keep your elbows bent at a 90-degree angle (right angle). ? Sit high and close to the steering wheel when you drive. Add lower back (lumbar) support to your car seat, if needed.  Take short walks on even surfaces as soon as you are able. Try to increase the length of time you walk each day.  Do not sit, drive, or stand in one place for more than 30 minutes at a time. Sitting or standing for long periods of time can put stress on your back.  Do not drive or use heavy machinery while taking prescription pain medicine.  Use proper lifting techniques. When you bend and lift, use positions that put less stress on your back: ? [place] your knees. ? Keep the load close to your body. ? Avoid twisting.  Exercise regularly as told by your health care provider. Exercising helps your back heal faster and helps prevent back injuries by keeping muscles  strong and flexible.  Work with a physical therapist to make a safe exercise program, as recommended by your health care provider. Do any exercises as told by your physical therapist.   Lifestyle  Maintain a healthy weight. Extra weight puts stress on your back and makes it difficult to have good posture.  Avoid activities or situations that make you feel anxious or stressed. Stress and anxiety increase muscle tension and can make back pain worse. Learn ways to manage anxiety and stress, such as through exercise. General instructions  Sleep on a firm mattress in a comfortable position. Try  lying on your side with your knees slightly bent. If you lie on your back, put a pillow under your knees.  Follow your treatment plan as told by your health care provider. This may include: ? Cognitive or behavioral therapy. ? Acupuncture or massage therapy. ? Meditation or yoga. Contact a health care provider if:  You have pain that is not relieved with rest or medicine.  You have increasing pain going down into your legs or buttocks.  Your pain does not improve after 2 weeks.  You have pain at night.  You lose weight without trying.  You have a fever or chills. Get help right away if:  You develop new bowel or bladder control problems.  You have unusual weakness or numbness in your arms or legs.  You develop nausea or vomiting.  You develop abdominal pain.  You feel faint. Summary  Acute back pain is sudden and usually short-lived.  Use proper lifting techniques. When you bend and lift, use positions that put less stress on your back.  Take over-the-counter and prescription medicines and apply heat or ice as directed by your health care provider. This information is not intended to replace advice given to you by your health care provider. Make sure you discuss any questions you have with your health care provider. Document Revised: 02/17/2020 Document Reviewed: 02/17/2020 Elsevier Patient Education  2021 [employer].

## 2020-09-06 NOTE — Telephone Encounter (Signed)
Pt wife calling back concerned for pt. Pt is rating pain 9/10.   Spoke w/ Kennon Rounds PA as NP is in room. Advised to make same day appointment. We will discuss with NP to ensure this is correct for pt when out of appointment.   Called wife. Wife VU.   Lorita Officer, CCMA 09/06/20 11:59 AM

## 2020-09-06 NOTE — Telephone Encounter (Signed)
Pt's wife called stating pt is still in bad pain. Pain is still in R hip down leg. Pt has been taking meloxicam twice a day. He tries to walk but can only make it to porch before having to sit back down due to pain. States meloxicam doesn't help. The flexeril helped a little, pt requesting a stronger or different muscle relaxer. Wife is concerned he cant exercise so he wont get better.   Lorita Officer, CCMA 09/06/20 9:08 AM
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
## 2020-09-07 ENCOUNTER — Telehealth: Payer: Self-pay

## 2020-09-07 NOTE — Telephone Encounter (Signed)
Gabapentin can be dosed at a higher dose than 300 mg once daily, but it can make him sleepy.  As long as he is arousable, I would think his sleepiness is due to the gabapentin. I would have him take one in the evening and if he is still having sciatica pain, he can call on Monday and we can try to increase dose. kc

## 2020-09-07 NOTE — Telephone Encounter (Signed)
Pt's wife calling stating at 8:00 pm last night pt took gabapentin 300 mg then went to bed. Woke up around 3:00 am and took another gabapentin 300 mg. He went to bed and is still asleep. Wife is wondering if this is okay. Medication is prescribed to take daily at bedtime.   Lorita Officer, CCMA 09/07/20 9:15 AM

## 2020-09-07 NOTE — Telephone Encounter (Signed)
Called wife. Wife VU and concerned for pt. Wife was grateful for help.  Lorita Officer, [place] 09/07/20 9:53 AM

## 2020-09-10 ENCOUNTER — Other Ambulatory Visit: Payer: Self-pay | Admitting: Nurse Practitioner

## 2020-09-10 DIAGNOSIS — M5441 Lumbago with sciatica, right side: Secondary | ICD-10-CM

## 2020-09-14 LAB — SPECIMEN STATUS REPORT

## 2020-09-14 LAB — HGB A1C W/O EAG: Hgb A1c MFr Bld: 5.7 % — ABNORMAL HIGH (ref 4.8–5.6)

## 2020-10-01 ENCOUNTER — Other Ambulatory Visit: Payer: Self-pay

## 2020-10-01 ENCOUNTER — Ambulatory Visit (INDEPENDENT_AMBULATORY_CARE_PROVIDER_SITE_OTHER): Payer: Medicare HMO | Admitting: Nurse Practitioner

## 2020-10-01 ENCOUNTER — Other Ambulatory Visit: Payer: Self-pay | Admitting: Nurse Practitioner

## 2020-10-01 ENCOUNTER — Encounter: Payer: Self-pay | Admitting: Nurse Practitioner

## 2020-10-01 VITALS — BP 142/82 | HR 66 | Temp 97.7°F | Ht 71.0 in | Wt 215.0 lb

## 2020-10-01 DIAGNOSIS — M1990 Unspecified osteoarthritis, unspecified site: Secondary | ICD-10-CM

## 2020-10-01 DIAGNOSIS — R030 Elevated blood-pressure reading, without diagnosis of hypertension: Secondary | ICD-10-CM

## 2020-10-01 DIAGNOSIS — R7301 Impaired fasting glucose: Secondary | ICD-10-CM | POA: Diagnosis not present

## 2020-10-01 DIAGNOSIS — Z833 Family history of diabetes mellitus: Secondary | ICD-10-CM

## 2020-10-01 DIAGNOSIS — M5441 Lumbago with sciatica, right side: Secondary | ICD-10-CM | POA: Diagnosis not present

## 2020-10-01 LAB — COMPREHENSIVE METABOLIC PANEL
ALT: 16 IU/L (ref 0–44)
AST: 16 IU/L (ref 0–40)
Albumin/Globulin Ratio: 1.4 (ref 1.2–2.2)
Albumin: 4.1 g/dL (ref 3.7–4.7)
Alkaline Phosphatase: 70 IU/L (ref 44–121)
BUN/Creatinine Ratio: 14 (ref 10–24)
BUN: 13 mg/dL (ref 8–27)
Bilirubin Total: 0.4 mg/dL (ref 0.0–1.2)
CO2: 22 mmol/L (ref 20–29)
Calcium: 9.5 mg/dL (ref 8.6–10.2)
Chloride: 106 mmol/L (ref 96–106)
Creatinine, Ser: 0.91 mg/dL (ref 0.76–1.27)
Globulin, Total: 2.9 g/dL (ref 1.5–4.5)
Glucose: 90 mg/dL (ref 65–99)
Potassium: 4.9 mmol/L (ref 3.5–5.2)
Sodium: 143 mmol/L (ref 134–144)
Total Protein: 7 g/dL (ref 6.0–8.5)
eGFR: 89 mL/min/{1.73_m2} (ref >59)

## 2020-10-01 MED ORDER — MELOXICAM 7.5 MG PO TABS: 7.5000 mg | ORAL_TABLET | Freq: Two times a day (BID) | ORAL | 0 refills | Status: DC

## 2020-10-01 NOTE — Patient Instructions (Addendum)
Continue prescribed medications Low carb, low sugar diet Keep scheduled appointment with orthopedic doctor this week Take Mobic 7.5 mg twice daily Monitor BP Follow-up in 3 months, fasting  Preventing Hypertension Hypertension, also called high blood pressure, is when the force of blood pumping through the arteries is too strong. Arteries are blood vessels that carry blood from the heart throughout the body. Often, hypertension does not cause symptoms until blood pressure is very high. It is important to have your blood pressure checked regularly. Diet and lifestyle changes can help you prevent hypertension, and they may make you feel better overall and improve your quality of life. If you already have hypertension, you may control it with diet and lifestyle changes, as well as with medicine. How can this condition affect me? Over time, hypertension can damage the arteries and decrease blood flow to important parts of the body, including the brain, heart, and kidneys. By keeping your blood pressure in a healthy range, you can help prevent complications like heart attack, heart failure, stroke, kidney failure, and vascular dementia. What can increase my risk?  Being an older adult. Older people are more often affected.  Having family members who have had high blood pressure.  Being obese.  Being male. Males are more likely to have high blood pressure.  Drinking too much alcohol or caffeine.  Smoking or using illegal drugs.  Taking certain medicines, such as antidepressants, decongestants, birth control pills, and NSAIDs, such as ibuprofen.  Having thyroid problems.  Having certain tumors. What actions can I take to prevent or manage this condition? Work with your health care provider to make a hypertension prevention plan that works for you. Follow your plan and keep all follow-up visits as told by your health care provider. Diet changes Maintain a healthy diet. This  includes:  Eating less salt (sodium). Ask your health care provider how much sodium is safe for you to have. The general recommendation is to have less than 1 tsp (2,300 mg) of sodium a day. ? Do not add salt to your food. ? Choose low-sodium options when grocery shopping and eating out.  Limiting fats in your diet. You can do this by eating low-fat or fat-free dairy products and by eating less red meat.  Eating more fruits, vegetables, and whole grains. Make a goal to eat: ? 1-2 cups of fresh fruits and vegetables each day. ? 3-4 servings of whole grains each day.  Avoiding foods and beverages that have added sugars.  Eating fish that contain healthy fats (omega-3 fatty acids), such as mackerel or salmon. If you need help putting together a healthy eating plan, try the DASH diet. This diet is high in fruits, vegetables, and whole grains. It is low in sodium, red meat, and added sugars. DASH stands for Dietary Approaches to Stop Hypertension.   Lifestyle changes Lose weight if you are overweight. Losing just 3?5% of your body weight can help prevent or control hypertension. For example, if your present weight is 200 lb (91 kg), a loss of 3-5% of your weight means losing 6-10 lb (2.7-4.5 kg). Ask your health care provider to help you with a diet and exercise plan to safely lose weight. Other recommendations usually include:  Get enough exercise. Do at least 150 minutes of moderate-intensity exercise each week. You could do this in short exercise sessions several times a day, or you could do longer exercise sessions a few times a week. For example, you could take a brisk 10-minute walk  or bike ride, 3 times a day, for 5 days a week.  Find ways to reduce stress, such as exercising, meditating, listening to music, or taking a yoga class. If you need help reducing stress, ask your health care provider.  Do not use any products that contain nicotine or tobacco, such as cigarettes, e-cigarettes,  and chewing tobacco. If you need help quitting, ask your health care provider. Chemicals in tobacco and nicotine products raise your blood pressure each time you use them. If you need help quitting, ask your health care provider.  Learn how to check your blood pressure at home. Make sure that you know your personal target blood pressure, as told by your health care provider.  Try to sleep 7-9 hours per night.   Alcohol use  Do not drink alcohol if: ? Your health care provider tells you not to drink. ? You are pregnant, may be pregnant, or are planning to become pregnant.  If you drink alcohol: ? Limit how much you use to:  0-1 drink a day for women.  0-2 drinks a day for men. ? Be aware of how much alcohol is in your drink. In the [place] one drink equals one 12 oz bottle of beer (355 mL), one 5 oz glass of wine (148 mL), or one 1 oz glass of hard liquor (44 mL). Medicines In addition to diet and lifestyle changes, your health care provider may recommend medicines to help lower your blood pressure. In general:  You may need to try a few different medicines to find what works best for you.  You may need to take more than one medicine.  Take over-the-counter and prescription medicines only as told by your health care provider. Questions to ask your health care provider  What is my blood pressure goal?  How can I lower my risk for high blood pressure?  How should I monitor my blood pressure at home? Where to find support Your health care provider can help you prevent hypertension and help you keep your blood pressure at a healthy level. Your local hospital or your community may also provide support services and prevention programs. The American Heart Association offers an online support network at supportnetwork.heart.org Where to find more information Learn more about hypertension from:  National Heart, Lung, and Blood Institute: PopSteam.is  Centers for Disease Control  and Prevention: FootballExhibition.com.br  American Academy of Family Physicians: familydoctor.org Learn more about the DASH diet from:  National Heart, Lung, and Blood Institute: PopSteam.is Contact a health care provider if:  You think you are having a reaction to medicines you have taken.  You have recurrent headaches or feel dizzy.  You have swelling in your ankles.  You have trouble with your vision. Get help right away if:  You have sudden, severe chest, back, or abdominal pain or discomfort.  You have shortness of breath.  You have a sudden, severe headache. These symptoms may represent a serious problem that is an emergency. Do not wait to see if the symptoms will go away. Get medical help right away. Call your local emergency services (911 in the [place] Do not drive yourself to the hospital.  Summary  Hypertension often does not cause any symptoms until blood pressure is very high. It is important to get your blood pressure checked regularly.  Diet and lifestyle changes are important steps in preventing hypertension.  By keeping your blood pressure in a healthy range, you may prevent complications like heart attack, heart failure, stroke,  and kidney failure.  Work with your health care provider to make a hypertension prevention plan that works for you. This information is not intended to replace advice given to you by your health care provider. Make sure you discuss any questions you have with your health care provider. Document Revised: 04/26/2019 Document Reviewed: 04/26/2019 Elsevier Patient Education  2021 Elsevier Inc.  Follow-up 45-months  Prediabetes Eating Plan Prediabetes is a condition that causes blood sugar (glucose) levels to be higher than normal. This increases the risk for developing type 2 diabetes (type 2 diabetes mellitus). Working with a health care provider or nutrition specialist (dietitian) to make diet and lifestyle changes can help prevent the onset of  diabetes. These changes may help you:  Control your blood glucose levels.  Improve your cholesterol levels.  Manage your blood pressure. What are tips for following this plan? Reading food labels  Read food labels to check the amount of fat, salt (sodium), and sugar in prepackaged foods. Avoid foods that have: ? Saturated fats. ? Trans fats. ? Added sugars.  Avoid foods that have more than 300 milligrams (mg) of sodium per serving. Limit your sodium intake to less than 2,300 mg each day. Shopping  Avoid buying pre-made and processed foods.  Avoid buying drinks with added sugar. Cooking  Cook with olive oil. Do not use butter, lard, or ghee.  Bake, broil, grill, steam, or boil foods. Avoid frying. Meal planning  Work with your dietitian to create an eating plan that is right for you. This may include tracking how many calories you take in each day. Use a food diary, notebook, or mobile application to track what you eat at each meal.  Consider following a Mediterranean diet. This includes: ? Eating several servings of fresh fruits and vegetables each day. ? Eating fish at least twice a week. ? Eating one serving each day of whole grains, beans, nuts, and seeds. ? Using olive oil instead of other fats. ? Limiting alcohol. ? Limiting red meat. ? Using nonfat or low-fat dairy products.  Consider following a plant-based diet. This includes dietary choices that focus on eating mostly vegetables and fruit, grains, beans, nuts, and seeds.  If you have high blood pressure, you may need to limit your sodium intake or follow a diet such as the DASH (Dietary Approaches to Stop Hypertension) eating plan. The DASH diet aims to lower high blood pressure.   Lifestyle  Set weight loss goals with help from your health care team. It is recommended that most people with prediabetes lose 7% of their body weight.  Exercise for at least 30 minutes 5 or more days a week.  Attend a support  group or seek support from a mental health counselor.  Take over-the-counter and prescription medicines only as told by your health care provider. What foods are recommended? Fruits Berries. Bananas. Apples. Oranges. Grapes. Papaya. Mango. Pomegranate. Kiwi. Grapefruit. Cherries. Vegetables Lettuce. Spinach. Peas. Beets. Cauliflower. Cabbage. Broccoli. Carrots. Tomatoes. Squash. Eggplant. Herbs. Peppers. Onions. Cucumbers. Brussels sprouts. Grains Whole grains, such as whole-wheat or whole-grain breads, crackers, cereals, and pasta. Unsweetened oatmeal. Bulgur. Barley. Quinoa. Brown rice. Corn or whole-wheat flour tortillas or taco shells. Meats and other proteins Seafood. Poultry without skin. Lean cuts of pork and beef. Tofu. Eggs. Nuts. Beans. Dairy Low-fat or fat-free dairy products, such as yogurt, cottage cheese, and cheese. Beverages Water. Tea. Coffee. Sugar-free or diet soda. Seltzer water. Low-fat or nonfat milk. Milk alternatives, such as soy or almond milk. Fats  and oils Olive oil. Canola oil. Sunflower oil. Grapeseed oil. Avocado. Walnuts. Sweets and desserts Sugar-free or low-fat pudding. Sugar-free or low-fat ice cream and other frozen treats. Seasonings and condiments Herbs. Sodium-free spices. Mustard. Relish. Low-salt, low-sugar ketchup. Low-salt, low-sugar barbecue sauce. Low-fat or fat-free mayonnaise. The items listed above may not be a complete list of recommended foods and beverages. Contact a dietitian for more information. What foods are not recommended? Fruits Fruits canned with syrup. Vegetables Canned vegetables. Frozen vegetables with butter or cream sauce. Grains Refined white flour and flour products, such as bread, pasta, snack foods, and cereals. Meats and other proteins Fatty cuts of meat. Poultry with skin. Breaded or fried meat. Processed meats. Dairy Full-fat yogurt, cheese, or milk. Beverages Sweetened drinks, such as iced tea and soda. Fats  and oils Butter. Lard. Ghee. Sweets and desserts Baked goods, such as cake, cupcakes, pastries, cookies, and cheesecake. Seasonings and condiments Spice mixes with added salt. Ketchup. Barbecue sauce. Mayonnaise. The items listed above may not be a complete list of foods and beverages that are not recommended. Contact a dietitian for more information. Where to find more information  American Diabetes Association: www.diabetes.org Summary  You may need to make diet and lifestyle changes to help prevent the onset of diabetes. These changes can help you control blood sugar, improve cholesterol levels, and manage blood pressure.  Set weight loss goals with help from your health care team. It is recommended that most people with prediabetes lose 7% of their body weight.  Consider following a Mediterranean diet. This includes eating plenty of fresh fruits and vegetables, whole grains, beans, nuts, seeds, fish, and low-fat dairy, and using olive oil instead of other fats. This information is not intended to replace advice given to you by your health care provider. Make sure you discuss any questions you have with your health care provider. Document Revised: 08/25/2019 Document Reviewed: 08/25/2019 Elsevier Patient Education  2021 Elsevier Inc. Hyperglycemia Hyperglycemia is when the sugar (glucose) level in your blood is too high. High blood sugar can happen to people who have or do not have diabetes. High blood sugar can happen quickly. It can be an emergency. What are the causes? If you have diabetes, high blood sugar may be caused by:  Medicines that increase blood sugar or affect your control of diabetes.  Getting less physical activity.  Overeating.  Being sick or injured or having an infection.  Having surgery.  Stress.  Not giving yourself enough insulin (if you are taking it). You may have high blood sugar because you have diabetes that has not been diagnosed yet. If you do  not have diabetes, high blood sugar may be caused by:  Certain medicines.  Stress.  A bad illness.  An infection.  Having surgery.  Diseases of the pancreas. What increases the risk? This condition is more likely to develop in people who have risk factors for diabetes, such as:  Having a family member with diabetes.  Certain conditions in which the body's defense system (immune system) attacks itself. These are called autoimmune disorders.  Being overweight.  Not being active.  Having a condition called insulin resistance.  Having a history of: ? Prediabetes. ? Diabetes when pregnant. ? Polycystic ovarian syndrome (PCOS). What are the signs or symptoms? This condition may not cause symptoms. If you do have symptoms, they may include:  Feeling more thirsty than normal.  Needing to pee (urinate) more often than normal.  Hunger.  Feeling very tired.  Blurry eyesight (vision). You may get other symptoms as the condition gets worse, such as:  Dry mouth.  Pain in your belly (abdomen).  Not being hungry (loss of appetite).  Breath that smells fruity.  Weakness.  Weight loss that is not planned.  A tingling or numb feeling in your hands or feet.  A headache.  Cuts or bruises that heal slowly. How is this treated? Treatment depends on the cause of your condition. Treatment may include:  Taking medicine to control your blood sugar levels.  Changing your medicine or dosage if you take insulin or other diabetes medicines.  Lifestyle changes. These may include: ? Exercising more. ? Eating healthier foods. ? Losing weight.  Treating an illness or infection.  Checking your blood sugar more often.  Stopping or reducing steroid medicines. If your condition gets very bad, you will need to be treated in the hospital. Follow these instructions at home: General instructions  Take over-the-counter and prescription medicines only as told by your  doctor.  Do not smoke or use any products that contain nicotine or tobacco. If you need help quitting, ask your doctor.  If you drink alcohol: ? Limit how much you have to:  0-1 drink a day for women who are not pregnant.  0-2 drinks a day for men. ? Know how much alcohol is in a drink. In the [place] one drink equals one 12 oz bottle of beer (355 mL), one 5 oz glass of wine (148 mL), or one 1 oz glass of hard liquor (44 mL).  Manage stress. If you need help with this, ask your doctor.  Do exercises as told by your doctor.  Keep all follow-up visits. Eating and drinking  Stay at a healthy weight.  Make sure you drink enough fluid when you: ? Exercise. ? Get sick. ? Are in hot temperatures.  Drink enough fluid to keep your pee (urine) pale yellow.   If you have diabetes:  Know the symptoms of high blood sugar.  Follow your diabetes management plan as told by your doctor. Make sure you: ? Take insulin and medicines as told. ? Follow your exercise plan. ? Follow your meal plan. Eat on time. Do not skip meals. ? Check your blood sugar as often as told. Make sure you check before and after exercise. If you exercise longer or in a different way, check your blood sugar more often. ? Follow your sick day plan whenever you cannot eat or drink normally. Make this plan ahead of time with your doctor.  Share your diabetes management plan with people in your workplace, school, and household.  Check your pee for ketones when you are ill and as told by your doctor.  Carry a card or wear jewelry that says that you have diabetes.   Where to find more information American Diabetes Association: www.diabetes.org Contact a doctor if:  Your blood sugar level is at or above 240 mg/dL (98.9 mmol/L) for 2 days in a row.  You have problems keeping your blood sugar in your target range.  You have high blood pressure often.  You have signs of illness, such as: ? Feeling like you may vomit  (feeling nauseous). ? Vomiting. ? A fever. Get help right away if:  Your blood sugar monitor reads "high" even when you are taking insulin.  You have trouble breathing.  You have a change in how you think, feel, or act (mental status).  You feel like you may vomit, and  the feeling does not go away.  You cannot stop vomiting. These symptoms may be an emergency. Get medical help right away. Call your local emergency services (911 in the [place]  Do not wait to see if the symptoms will go away.  Do not drive yourself to the hospital. Summary  Hyperglycemia is when the sugar (glucose) level in your blood is too high.  High blood sugar can happen to people who have or do not have diabetes.  Make sure you drink enough fluids and follow your meal plan. Exercise as often as told by your doctor.  Contact your doctor if you have problems keeping your blood sugar in your target range. This information is not intended to replace advice given to you by your health care provider. Make sure you discuss any questions you have with your health care provider. Document Revised: 03/09/2020 Document Reviewed: 03/09/2020 Elsevier Patient Education  2021 [employer].
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
## 2020-10-05 DIAGNOSIS — M5416 Radiculopathy, lumbar region: Secondary | ICD-10-CM | POA: Diagnosis not present

## 2021-01-01 ENCOUNTER — Ambulatory Visit (INDEPENDENT_AMBULATORY_CARE_PROVIDER_SITE_OTHER): Payer: Medicare HMO | Admitting: Nurse Practitioner

## 2021-01-01 ENCOUNTER — Encounter: Payer: Self-pay | Admitting: Nurse Practitioner

## 2021-01-01 ENCOUNTER — Other Ambulatory Visit: Payer: Self-pay | Admitting: Nurse Practitioner

## 2021-01-01 ENCOUNTER — Other Ambulatory Visit: Payer: Self-pay

## 2021-01-01 VITALS — BP 140/78 | HR 68 | Temp 97.9°F | Ht 71.0 in | Wt 224.0 lb

## 2021-01-01 DIAGNOSIS — R030 Elevated blood-pressure reading, without diagnosis of hypertension: Secondary | ICD-10-CM

## 2021-01-01 DIAGNOSIS — I700 Atherosclerosis of aorta: Secondary | ICD-10-CM | POA: Diagnosis not present

## 2021-01-01 DIAGNOSIS — R7301 Impaired fasting glucose: Secondary | ICD-10-CM | POA: Diagnosis not present

## 2021-01-01 DIAGNOSIS — M5441 Lumbago with sciatica, right side: Secondary | ICD-10-CM

## 2021-01-01 MED ORDER — GABAPENTIN 300 MG PO CAPS
300.0000 mg | ORAL_CAPSULE | Freq: Every day | ORAL | 1 refills | Status: DC
Start: 2021-01-01 — End: 2021-03-22
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
## 2021-01-01 NOTE — Patient Instructions (Signed)
Continue medication We will call you with lab results Monitor BP at home, notify office if above 140/90 Follow-up in 62-months  Preventing Hypertension Hypertension, also called high blood pressure, is when the force of blood pumping through the arteries is too strong. Arteries are blood vessels that carry blood from the heart throughout the body. Often, hypertension does not cause symptoms until blood pressure is very high. It is important to have yourblood pressure checked regularly. Diet and lifestyle changes can help you prevent hypertension, and they may make you feel better overall and improve your quality of life. If you already have hypertension, you may control it with diet and lifestyle changes, as well aswith medicine. How can this condition affect me? Over time, hypertension can damage the arteries and decrease blood flow to important parts of the body, including the brain, heart, and kidneys. By keeping your blood pressure in a healthy range, you can help prevent complications like heart attack, heart failure, stroke, kidney failure, andvascular dementia. What can increase my risk? Being an older adult. Older people are more often affected. Having family members who have had high blood pressure. Being obese. Being male. Males are more likely to have high blood pressure. Drinking too much alcohol or caffeine. Smoking or using illegal drugs. Taking certain medicines, such as antidepressants, decongestants, birth control pills, and NSAIDs, such as ibuprofen. Having thyroid problems. Having certain tumors. What actions can I take to prevent or manage this condition? Work with your health care provider to make a hypertension prevention plan that works for you. Follow your plan and keep all follow-up visits as told by yourhealth care provider. Diet changes Maintain a healthy diet. This includes: Eating less salt (sodium). Ask your health care provider how much sodium is safe for you to  have. The general recommendation is to have less than 1 tsp (2,300 mg) of sodium a day. Do not add salt to your food. Choose low-sodium options when grocery shopping and eating out. Limiting fats in your diet. You can do this by eating low-fat or fat-free dairy products and by eating less red meat. Eating more fruits, vegetables, and whole grains. Make a goal to eat: 1-2 cups of fresh fruits and vegetables each day. 3-4 servings of whole grains each day. Avoiding foods and beverages that have added sugars. Eating fish that contain healthy fats (omega-3 fatty acids), such as mackerel or salmon. If you need help putting together a healthy eating plan, try the DASH diet. This diet is high in fruits, vegetables, and whole grains. It is low in sodium, red meat, and added sugars. DASH stands for Dietary Approaches to StopHypertension. Lifestyle changes  Lose weight if you are overweight. Losing just 3?5% of your body weight can help prevent or control hypertension. For example, if your present weight is 200 lb (91 kg), a loss of 3-5% of your weight means losing 6-10 lb (2.7-4.5 kg). Ask your health care provider to help you with a diet and exercise plan tosafely lose weight. Other recommendations usually include: Get enough exercise. Do at least 150 minutes of moderate-intensity exercise each week. You could do this in short exercise sessions several times a day, or you could do longer exercise sessions a few times a week. For example, you could take a brisk 10-minute walk or bike ride, 3 times a day, for 5 days a week. Find ways to reduce stress, such as exercising, meditating, listening to music, or taking a yoga class. If you need help reducing  stress, ask your health care provider. Do not use any products that contain nicotine or tobacco, such as cigarettes, e-cigarettes, and chewing tobacco. If you need help quitting, ask your health care provider. Chemicals in tobacco and nicotine products raise  your blood pressure each time you use them. If you need help quitting, ask your health care provider. Learn how to check your blood pressure at home. Make sure that you know your personal target blood pressure, as told by your health care provider. Try to sleep 7-9 hours per night.  Alcohol use Do not drink alcohol if: Your health care provider tells you not to drink. You are pregnant, may be pregnant, or are planning to become pregnant. If you drink alcohol: Limit how much you use to: 0-1 drink a day for women. 0-2 drinks a day for men. Be aware of how much alcohol is in your drink. In the [place] one drink equals one 12 oz bottle of beer (355 mL), one 5 oz glass of wine (148 mL), or one 1 oz glass of hard liquor (44 mL). Medicines In addition to diet and lifestyle changes, your health care provider may recommend medicines to help lower your blood pressure. In general: You may need to try a few different medicines to find what works best for you. You may need to take more than one medicine. Take over-the-counter and prescription medicines only as told by your health care provider. Questions to ask your health care provider What is my blood pressure goal? How can I lower my risk for high blood pressure? How should I monitor my blood pressure at home? Where to find support Your health care provider can help you prevent hypertension and help you keep your blood pressure at a healthy level. Your local hospital or your communitymay also provide support services and prevention programs. The American Heart Association offers an online support network at supportnetwork.heart.org Where to find more information Learn more about hypertension from: National Heart, Lung, and Blood Institute: PopSteam.is Centers for Disease Control and Prevention: FootballExhibition.com.br American Academy of Family Physicians: familydoctor.org Learn more about the DASH diet from: National Heart, Lung, and Blood Institute:  PopSteam.is Contact a health care provider if: You think you are having a reaction to medicines you have taken. You have recurrent headaches or feel dizzy. You have swelling in your ankles. You have trouble with your vision. Get help right away if: You have sudden, severe chest, back, or abdominal pain or discomfort. You have shortness of breath. You have a sudden, severe headache. These symptoms may represent a serious problem that is an emergency. Do not wait to see if the symptoms will go away. Get medical help right away. Call your local emergency services (911 in the [place] Do not drive yourself to the hospital. Summary Hypertension often does not cause any symptoms until blood pressure is very high. It is important to get your blood pressure checked regularly. Diet and lifestyle changes are important steps in preventing hypertension. By keeping your blood pressure in a healthy range, you may prevent complications like heart attack, heart failure, stroke, and kidney failure. Work with your health care provider to make a hypertension prevention plan that works for you. This information is not intended to replace advice given to you by your health care provider. Make sure you discuss any questions you have with your healthcare provider. Document Revised: 04/26/2019 Document Reviewed: 04/26/2019 Elsevier Patient Education  2022 [employer] Preventing High Cholesterol Cholesterol is a white, waxy substance  similar to fat that the human body needs to help build cells. The liver makes all the cholesterol that a person's body needs. Having high cholesterol (hypercholesterolemia) increases your risk for heart disease and stroke. Extra or excess cholesterolcomes from the food that you eat. High cholesterol can often be prevented with diet and lifestyle changes. If you already have high cholesterol, you can control it with diet, lifestyle changes,and medicines. How can high cholesterol  affect me? If you have high cholesterol, fatty deposits (plaques) may build up on the walls of your blood vessels. The blood vessels that carry blood away from your heart are called arteries. Plaques make the arteries narrower and stiffer. This in turn can: Restrict or block blood flow and cause blood clots to form. Increase your risk for heart attack and stroke. What can increase my risk for high cholesterol? This condition is more likely to develop in people who: Eat foods that are high in saturated fat or cholesterol. Saturated fat is mostly found in foods that come from animal sources. Are overweight. Are not getting enough exercise. Have a family history of high cholesterol (familial hypercholesterolemia). What actions can I take to prevent this? Nutrition  Eat less saturated fat. Avoid trans fats (partially hydrogenated oils). These are often found in margarine and in some baked goods, fried foods, and snacks bought in packages. Avoid precooked or cured meat, such as bacon, sausages, or meat loaves. Avoid foods and drinks that have added sugars. Eat more fruits, vegetables, and whole grains. Choose healthy sources of protein, such as fish, poultry, lean cuts of red meat, beans, peas, lentils, and nuts. Choose healthy sources of fat, such as: Nuts. Vegetable oils, especially olive oil. Fish that have healthy fats, such as omega-3 fatty acids. These fish include mackerel or salmon.  Lifestyle Lose weight if you are overweight. Maintaining a healthy body mass index (BMI) can help prevent or control high cholesterol. It can also lower your risk for diabetes and high blood pressure. Ask your health care provider to help you with a diet and exercise plan to lose weight safely. Do not use any products that contain nicotine or tobacco, such as cigarettes, e-cigarettes, and chewing tobacco. If you need help quitting, ask your health care provider. Alcohol use Do not drink alcohol if: Your  health care provider tells you not to drink. You are pregnant, may be pregnant, or are planning to become pregnant. If you drink alcohol: Limit how much you use to: 0-1 drink a day for women. 0-2 drinks a day for men. Be aware of how much alcohol is in your drink. In the [place] one drink equals one 12 oz bottle of beer (355 mL), one 5 oz glass of wine (148 mL), or one 1 oz glass of hard liquor (44 mL). Activity  Get enough exercise. Do exercises as told by your health care provider. Each week, do at least 150 minutes of exercise that takes a medium level of effort (moderate-intensity exercise). This kind of exercise: Makes your heart beat faster while allowing you to still be able to talk. Can be done in short sessions several times a day or longer sessions a few times a week. For example, on 5 days each week, you could walk fast or ride your bike 3 times a day for 10 minutes each time.  Medicines Your health care provider may recommend medicines to help lower cholesterol. This may be a medicine to lower the amount of cholesterol that your liver makes.  You may need medicine if: Diet and lifestyle changes have not lowered your cholesterol enough. You have high cholesterol and other risk factors for heart disease or stroke. Take over-the-counter and prescription medicines only as told by your health care provider. General information Manage your risk factors for high cholesterol. Talk with your health care provider about all your risk factors and how to lower your risk. Manage other conditions that you have, such as diabetes or high blood pressure (hypertension). Have blood tests to check your cholesterol levels at regular points in time as told by your health care provider. Keep all follow-up visits as told by your health care provider. This is important. Where to find more information American Heart Association: www.heart.org National Heart, Lung, and Blood Institute:  PopSteam.is Summary High cholesterol increases your risk for heart disease and stroke. By keeping your cholesterol level low, you can reduce your risk for these conditions. High cholesterol can often be prevented with diet and lifestyle changes. Work with your health care provider to manage your risk factors, and have your blood tested regularly. This information is not intended to replace advice given to you by your health care provider. Make sure you discuss any questions you have with your healthcare provider. Document Revised: 03/08/2019 Document Reviewed: 03/08/2019 Elsevier Patient Education  2022 [employer].

## 2021-01-02 LAB — LIPID PANEL
Chol/HDL Ratio: 3.2 ratio (ref 0.0–5.0)
Cholesterol, Total: 191 mg/dL (ref 100–199)
HDL: 59 mg/dL (ref >39)
LDL Chol Calc (NIH): 121 mg/dL — ABNORMAL HIGH (ref 0–99)
Triglycerides: 61 mg/dL (ref 0–149)
VLDL Cholesterol Cal: 11 mg/dL (ref 5–40)

## 2021-01-02 LAB — CARDIOVASCULAR RISK ASSESSMENT

## 2021-01-02 LAB — COMPREHENSIVE METABOLIC PANEL WITH GFR
ALT: 11 IU/L (ref 0–44)
AST: 15 IU/L (ref 0–40)
Albumin/Globulin Ratio: 1.4 (ref 1.2–2.2)
Albumin: 4.2 g/dL (ref 3.7–4.7)
Alkaline Phosphatase: 71 IU/L (ref 44–121)
BUN/Creatinine Ratio: 14 (ref 10–24)
BUN: 11 mg/dL (ref 8–27)
Bilirubin Total: 0.5 mg/dL (ref 0.0–1.2)
CO2: 25 mmol/L (ref 20–29)
Calcium: 9.5 mg/dL (ref 8.6–10.2)
Chloride: 103 mmol/L (ref 96–106)
Creatinine, Ser: 0.81 mg/dL (ref 0.76–1.27)
Globulin, Total: 2.9 g/dL (ref 1.5–4.5)
Glucose: 88 mg/dL (ref 65–99)
Potassium: 5.1 mmol/L (ref 3.5–5.2)
Sodium: 142 mmol/L (ref 134–144)
Total Protein: 7.1 g/dL (ref 6.0–8.5)
eGFR: 93 mL/min/1.73

## 2021-01-02 LAB — CBC WITH DIFFERENTIAL/PLATELET
Basophils Absolute: 0.1 10*3/uL (ref 0.0–0.2)
Basos: 1 %
EOS (ABSOLUTE): 0.3 10*3/uL (ref 0.0–0.4)
Eos: 5 %
Hematocrit: 42 % (ref 37.5–51.0)
Hemoglobin: 14.2 g/dL (ref 13.0–17.7)
Immature Grans (Abs): 0 10*3/uL (ref 0.0–0.1)
Immature Granulocytes: 0 %
Lymphocytes Absolute: 1.4 10*3/uL (ref 0.7–3.1)
Lymphs: 23 %
MCH: 30.2 pg (ref 26.6–33.0)
MCHC: 33.8 g/dL (ref 31.5–35.7)
MCV: 89 fL (ref 79–97)
Monocytes Absolute: 0.4 10*3/uL (ref 0.1–0.9)
Monocytes: 7 %
Neutrophils Absolute: 3.9 10*3/uL (ref 1.4–7.0)
Neutrophils: 64 %
Platelets: 213 10*3/uL (ref 150–450)
RBC: 4.7 x10E6/uL (ref 4.14–5.80)
RDW: 14.3 % (ref 11.6–15.4)
WBC: 6.1 10*3/uL (ref 3.4–10.8)

## 2021-01-02 LAB — HEMOGLOBIN A1C
Est. average glucose Bld gHb Est-mCnc: 117 mg/dL
Hgb A1c MFr Bld: 5.7 % — ABNORMAL HIGH (ref 4.8–5.6)

## 2021-01-08 DIAGNOSIS — H524 Presbyopia: Secondary | ICD-10-CM | POA: Diagnosis not present

## 2021-01-23 DIAGNOSIS — H34831 Tributary (branch) retinal vein occlusion, right eye, with macular edema: Secondary | ICD-10-CM | POA: Diagnosis not present

## 2021-02-03 ENCOUNTER — Telehealth: Payer: Self-pay

## 2021-02-03 NOTE — Telephone Encounter (Signed)
Called pt spoke to his wife Keith Jenkins. Informed her that the pt needs an appt for a AWV. Pts wife stated he was out in the yard and they will give the office a call on Monday 02/04/2021 to set up and appt. Told pts wife the visits are now being offered on the weekend. Pts wife verbalized understanding.  KP

## 2021-02-19 ENCOUNTER — Other Ambulatory Visit: Payer: Self-pay | Admitting: Nurse Practitioner

## 2021-02-19 DIAGNOSIS — M5441 Lumbago with sciatica, right side: Secondary | ICD-10-CM

## 2021-02-19 DIAGNOSIS — M1990 Unspecified osteoarthritis, unspecified site: Secondary | ICD-10-CM

## 2021-02-20 DIAGNOSIS — H34831 Tributary (branch) retinal vein occlusion, right eye, with macular edema: Secondary | ICD-10-CM | POA: Diagnosis not present

## 2021-02-23 ENCOUNTER — Telehealth: Payer: Self-pay

## 2021-02-23 NOTE — Telephone Encounter (Signed)
I talked with patient's wife and she mentioned that patient will call the office to schedule an appointment.

## 2021-03-14 DIAGNOSIS — H401131 Primary open-angle glaucoma, bilateral, mild stage: Secondary | ICD-10-CM | POA: Diagnosis not present

## 2021-03-20 DIAGNOSIS — H34831 Tributary (branch) retinal vein occlusion, right eye, with macular edema: Secondary | ICD-10-CM | POA: Diagnosis not present

## 2021-03-22 ENCOUNTER — Other Ambulatory Visit: Payer: Self-pay | Admitting: Nurse Practitioner

## 2021-03-22 DIAGNOSIS — M5441 Lumbago with sciatica, right side: Secondary | ICD-10-CM

## 2021-04-25 DIAGNOSIS — H34831 Tributary (branch) retinal vein occlusion, right eye, with macular edema: Secondary | ICD-10-CM | POA: Diagnosis not present

## 2021-05-15 DIAGNOSIS — H401131 Primary open-angle glaucoma, bilateral, mild stage: Secondary | ICD-10-CM | POA: Diagnosis not present

## 2021-05-15 DIAGNOSIS — H2513 Age-related nuclear cataract, bilateral: Secondary | ICD-10-CM | POA: Diagnosis not present

## 2021-05-20 ENCOUNTER — Ambulatory Visit (INDEPENDENT_AMBULATORY_CARE_PROVIDER_SITE_OTHER): Payer: Medicare HMO

## 2021-05-20 DIAGNOSIS — Z23 Encounter for immunization: Secondary | ICD-10-CM | POA: Diagnosis not present

## 2021-06-20 DIAGNOSIS — H34831 Tributary (branch) retinal vein occlusion, right eye, with macular edema: Secondary | ICD-10-CM | POA: Diagnosis not present

## 2021-06-21 ENCOUNTER — Other Ambulatory Visit: Payer: Self-pay

## 2021-06-21 DIAGNOSIS — M5441 Lumbago with sciatica, right side: Secondary | ICD-10-CM

## 2021-06-21 DIAGNOSIS — M1990 Unspecified osteoarthritis, unspecified site: Secondary | ICD-10-CM

## 2021-06-22 ENCOUNTER — Other Ambulatory Visit: Payer: Self-pay | Admitting: Nurse Practitioner

## 2021-06-22 DIAGNOSIS — M1990 Unspecified osteoarthritis, unspecified site: Secondary | ICD-10-CM

## 2021-06-22 DIAGNOSIS — M5441 Lumbago with sciatica, right side: Secondary | ICD-10-CM

## 2021-06-22 MED ORDER — GABAPENTIN 300 MG PO CAPS: 300.0000 mg | ORAL_CAPSULE | Freq: Every day | ORAL | 0 refills | Status: DC

## 2021-06-22 MED ORDER — MELOXICAM 7.5 MG PO TABS: ORAL_TABLET | ORAL | 0 refills | Status: DC

## 2021-07-09 ENCOUNTER — Other Ambulatory Visit: Payer: Self-pay

## 2021-07-09 ENCOUNTER — Encounter: Payer: Self-pay | Admitting: Nurse Practitioner

## 2021-07-09 ENCOUNTER — Ambulatory Visit (INDEPENDENT_AMBULATORY_CARE_PROVIDER_SITE_OTHER): Payer: Medicare HMO | Admitting: Nurse Practitioner

## 2021-07-09 VITALS — BP 148/78 | HR 61 | Temp 97.4°F | Ht 74.0 in | Wt 227.0 lb

## 2021-07-09 DIAGNOSIS — N521 Erectile dysfunction due to diseases classified elsewhere: Secondary | ICD-10-CM

## 2021-07-09 DIAGNOSIS — R7303 Prediabetes: Secondary | ICD-10-CM | POA: Diagnosis not present

## 2021-07-09 DIAGNOSIS — R5383 Other fatigue: Secondary | ICD-10-CM | POA: Diagnosis not present

## 2021-07-09 DIAGNOSIS — Z23 Encounter for immunization: Secondary | ICD-10-CM | POA: Diagnosis not present

## 2021-07-09 DIAGNOSIS — R030 Elevated blood-pressure reading, without diagnosis of hypertension: Secondary | ICD-10-CM

## 2021-07-09 DIAGNOSIS — Z1211 Encounter for screening for malignant neoplasm of colon: Secondary | ICD-10-CM | POA: Diagnosis not present

## 2021-07-09 DIAGNOSIS — H4053X1 Glaucoma secondary to other eye disorders, bilateral, mild stage: Secondary | ICD-10-CM | POA: Diagnosis not present

## 2021-07-09 DIAGNOSIS — I700 Atherosclerosis of aorta: Secondary | ICD-10-CM

## 2021-07-09 MED ORDER — SILDENAFIL CITRATE 50 MG PO TABS: 50.0000 mg | ORAL_TABLET | Freq: Every day | ORAL | 0 refills | Status: DC | PRN

## 2021-07-09 MED ORDER — TADALAFIL 10 MG PO TABS: 10.0000 mg | ORAL_TABLET | ORAL | 1 refills | Status: DC | PRN

## 2021-07-09 NOTE — Patient Instructions (Addendum)
We will call you with lab results  Pneumonia vaccine given in-office today Return in 2-weeks for Shingles vaccine, will received 2nd shingles vaccine 2 months later Follow-up 71-months, or sooner if needed   Preventing Osteoarthritis, Adult Osteoarthritis is a type of arthritis that affects tissue that covers the ends of bones in joints (cartilage). Cartilage acts as a cushion between the bones and helps them move smoothly. Osteoarthritis results when cartilage in the joints gets worn down. This causes the joint to break down over time. Osteoarthritis cannot always be prevented, but you can take steps to lower your risk of developing this condition. Once you have osteoarthritis, it does not go away, so lowering your risk is important. How can this condition affect me? Osteoarthritis can affect any joint and can make movement painful. It usually affects the joints of the hands, spine, hips, knees, or toes. The knee joints are most commonly affected. The condition can cause symptoms such as: Pain, tenderness, and stiffness in a joint, especially first thing in the morning or after a period of resting. Trouble moving the affected joint. A grating or scraping feeling inside the joint when using it. This condition can make it harder to do things that you need or want to do each day. Osteoarthritis in a major joint, such as your knee or hip, can make it painful to walk or exercise. If you develop osteoarthritis in your hands, you might not be able to grip items, twist your hand, or control small movements of your hands and fingers. Osteoarthritis is a degenerative disease. This means that it may get worse over time. What can increase my risk? Some factors may make you more likely to develop osteoarthritis, such as: Being 55 years of age or older. Being overweight. Doing activities that put extra stress on your joints. Injuring or overusing a joint. Having previous surgery on a joint. Having a family  history of osteoarthritis. Some risk factors, such as your age and family history, cannot be changed. But you can take steps to control other risk factors and lower your chances of developing osteoarthritis. What actions can I take to prevent this condition? Activity   Stay active. Doing moderate exercise for 30 minutes, 5 times a week will help you maintain a healthy weight and strengthen the muscles that support your joints. Good exercises for preventing osteoarthritis and protecting your joints include low-impact activities, such as walking, swimming, hiking, yoga, and biking. Avoid doing too many high-impact exercises and activities that put extra stress on joints. These include activities that involve heavy lifting, gripping, bending, kneeling, and squatting. Osteoarthritis is much more common in people with jobs that require these activities. If your job involves this type of activity, learn ways to reduce the stress on your joints, such as: Using proper lifting techniques. Taking breaks as needed. Consider working with a Teacher, English as a foreign language when starting a new physical activity or sport. A coach or trainer can help you: Learn proper techniques for the activity. Come up with a training plan that is safe and effective for you. Get treatment for any joint injury. After an injury, do not return to full activity until you have been cleared by your health care provider. An injured joint that does not heal properly is much more likely to develop osteoarthritis. Maintain good posture while standing and sitting. This reduces stress on the joints of your back, neck, and hips. When standing, keep your upper back and neck straight, with your shoulders pulled back. Avoid  slouching. When sitting, keep your back straight and relax your shoulders. Do not round your shoulders or pull them backward. Lifestyle Lose weight if you are overweight. More weight puts more stress on your joints. Control your blood  sugar. Diabetes may increase your risk of osteoarthritis. Have your blood sugar checked to make sure you are not at risk of developing diabetes. If you already have diabetes, work closely with your health care provider to keep your blood sugar at a healthy level. Do not wear high heels. These put stress on your toes, knees, and hips. Do not use any products that contain nicotine or tobacco, such as cigarettes, e-cigarettes, and chewing tobacco. If you need help quitting, ask your health care provider. Nutrition  Eat a healthy diet that includes foods that are high in vitamin C, vitamin D, and omega-3 fatty acids. Get vitamin C from fruits and vegetables. Get vitamin D from fish, eggs, and dairy products. Some foods have vitamin D added to them (are fortified). Look for fortified foods like milk, orange juice, and cereals. Spending some time in the sun also increases vitamin D. Get omega-3 from cold-water fish, such as cod, mackerel, and sardines. Other sources include: Fortified eggs. Soybeans. Nuts, such as walnuts. Flax, chia, and hemp seeds. Check with your health care provider before taking an over-the-counter supplement for bone or joint health, especially if you are taking any medicines. Supplements may interact with some medicines. Also, evidence that taking a supplement will reduce your risk of osteoarthritis is not strong. Where to find more information Arthritis Foundation: www.arthritis.org [employer] of Rheumatology: www.rheumatology.[facility] of Arthritis and Musculoskeletal and Skin Diseases: www.niams.http://www.myers.net/ Contact a health care provider if: You have pain or swelling in a joint. You have joint stiffness or you lose full motion at the joint. Your joint becomes large and knobby. You have cracking or grinding in a joint. You have a joint injury. Summary Osteoarthritis involves the loss of tissue that covers the ends of bones in joints (cartilage). It can  affect any joint and can make movement painful. Osteoarthritis is most common in middle and older age. Osteoarthritis cannot always be prevented, but you can take steps to lower your risk of developing this condition. Eating a healthy diet, losing weight, and staying active may lower your risk of osteoarthritis. Let your health care provider know if you have a joint injury or joint pain. This information is not intended to replace advice given to you by your health care provider. Make sure you discuss any questions you have with your health care provider. Document Revised: 12/06/2018 Document Reviewed: 08/31/2018 Elsevier Patient Education  2022 Elsevier Inc.    Pneumococcal Conjugate Vaccine: What You Need to Know 1. Why get vaccinated? Pneumococcal conjugate vaccine can prevent pneumococcal disease. Pneumococcal disease refers to any illness caused by pneumococcal bacteria. These bacteria can cause many types of illnesses, including pneumonia, which is an infection of the lungs. Pneumococcal bacteria are one of the most common causes of pneumonia. Besides pneumonia, pneumococcal bacteria can also cause: Ear infections Sinus infections Meningitis (infection of the tissue covering the brain and spinal cord) Bacteremia (infection of the blood) Anyone can get pneumococcal disease, but children under 59 years old, people with certain medical conditions or other risk factors, and adults 65 years or older are at the highest risk. Most pneumococcal infections are mild. However, some can result in long-term problems, such as brain damage or hearing loss. Meningitis, bacteremia, and pneumonia caused by pneumococcal  disease can be fatal. 2. Pneumococcal conjugate vaccine Pneumococcal conjugate vaccine helps protect against bacteria that cause pneumococcal disease. There are three pneumococcal conjugate vaccines (PCV13, PCV15, and PCV20). The different vaccines are recommended for different people based  on their age and medical status. PCV13 Infants and young children usually need 4 doses of PCV13, at ages 58, 77, 12, and 12-15 months. Older children (through age 24 months) may be vaccinated with PCV13 if they did not receive the recommended doses. Children and adolescents 38-26 years of age with certain medical conditions should receive a single dose of PCV13 if they did not already receive PCV13. PCV15 or PCV20 Adults 47 through 74 years old with certain medical conditions or other risk factors who have not already received a pneumococcal conjugate vaccine should receive either: a single dose of PCV15 followed by a dose of pneumococcal polysaccharide vaccine (PPSV23), or a single dose of PCV20. Adults 65 years or older who have not already received a pneumococcal conjugate vaccine should receive either: a single dose of PCV15 followed by a dose of PPSV23, or a single dose of PCV20. Your health care provider can give you more information. 3. Talk with your health care provider Tell your vaccination provider if the person getting the vaccine: Has had an allergic reaction after a previous dose of any type of pneumococcal conjugate vaccine (PCV13, PCV15, PCV20, or an earlier pneumococcal conjugate vaccine known as PCV7), or to any vaccine containing diphtheria toxoid (for example, DTaP), or has any severe, life-threatening allergies In some cases, your health care provider may decide to postpone pneumococcal conjugate vaccination until a future visit. People with minor illnesses, such as a cold, may be vaccinated. People who are moderately or severely ill should usually wait until they recover. Your health care provider can give you more information. 4. Risks of a vaccine reaction Redness, swelling, pain, or tenderness where the shot is given, and fever, loss of appetite, fussiness (irritability), feeling tired, headache, muscle aches, joint pain, and chills can happen after pneumococcal conjugate  vaccination. Young children may be at increased risk for seizures caused by fever after PCV13 if it is administered at the same time as inactivated influenza vaccine. Ask your health care provider for more information. People sometimes faint after medical procedures, including vaccination. Tell your provider if you feel dizzy or have vision changes or ringing in the ears. As with any medicine, there is a very remote chance of a vaccine causing a severe allergic reaction, other serious injury, or death. 5. What if there is a serious problem? An allergic reaction could occur after the vaccinated person leaves the clinic. If you see signs of a severe allergic reaction (hives, swelling of the face and throat, difficulty breathing, a fast heartbeat, dizziness, or weakness), call 9-1-1 and get the person to the nearest hospital. For other signs that concern you, call your health care provider. Adverse reactions should be reported to the Vaccine Adverse Event Reporting System (VAERS). Your health care provider will usually file this report, or you can do it yourself. Visit the VAERS website at www.vaers.LAgents.no or call (567)817-1732. VAERS is only for reporting reactions, and VAERS staff members do not give medical advice. 6. The National Vaccine Injury Compensation Program The [employer] Vaccine Injury Compensation Program (VICP) is a federal program that was created to compensate people who may have been injured by certain vaccines. Claims regarding alleged injury or death due to vaccination have a time limit for filing, which may be as  short as two years. Visit the VICP website at SpiritualWord.at or call 786-431-5354 to learn about the program and about filing a claim. 7. How can I learn more? Ask your health care provider. Call your local or state health department. Visit the website of the Food and Drug Administration (FDA) for vaccine package inserts and additional information at  FinderList.no. Contact the Centers for Disease Control and Prevention (CDC): Call 305-789-5078 (1-800-CDC-INFO) or Visit CDC's website at PicCapture.uy. Vaccine Information Statement (Interim) Pneumococcal Conjugate Vaccine (07/13/2020) This information is not intended to replace advice given to you by your health care provider. Make sure you discuss any questions you have with your health care provider. Document Revised: 02/08/2021 Document Reviewed: 07/27/2020 Elsevier Patient Education  2022 [employer].
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
## 2021-07-10 LAB — LIPID PANEL
Chol/HDL Ratio: 3.5 ratio (ref 0.0–5.0)
Cholesterol, Total: 194 mg/dL (ref 100–199)
HDL: 55 mg/dL (ref >39)
LDL Chol Calc (NIH): 129 mg/dL — ABNORMAL HIGH (ref 0–99)
Triglycerides: 55 mg/dL (ref 0–149)
VLDL Cholesterol Cal: 10 mg/dL (ref 5–40)

## 2021-07-10 LAB — HEMOGLOBIN A1C
Est. average glucose Bld gHb Est-mCnc: 120 mg/dL
Hgb A1c MFr Bld: 5.8 % — ABNORMAL HIGH (ref 4.8–5.6)

## 2021-07-10 LAB — COMPREHENSIVE METABOLIC PANEL
ALT: 15 IU/L (ref 0–44)
AST: 21 IU/L (ref 0–40)
Albumin/Globulin Ratio: 1.6 (ref 1.2–2.2)
Albumin: 4.5 g/dL (ref 3.7–4.7)
Alkaline Phosphatase: 73 IU/L (ref 44–121)
BUN/Creatinine Ratio: 13 (ref 10–24)
BUN: 11 mg/dL (ref 8–27)
Bilirubin Total: 0.8 mg/dL (ref 0.0–1.2)
CO2: 23 mmol/L (ref 20–29)
Calcium: 9.4 mg/dL (ref 8.6–10.2)
Chloride: 105 mmol/L (ref 96–106)
Creatinine, Ser: 0.87 mg/dL (ref 0.76–1.27)
Globulin, Total: 2.8 g/dL (ref 1.5–4.5)
Glucose: 85 mg/dL (ref 70–99)
Potassium: 4.8 mmol/L (ref 3.5–5.2)
Sodium: 143 mmol/L (ref 134–144)
Total Protein: 7.3 g/dL (ref 6.0–8.5)
eGFR: 91 mL/min/{1.73_m2} (ref >59)

## 2021-07-10 LAB — CBC WITH DIFFERENTIAL/PLATELET
Basophils Absolute: 0.1 10*3/uL (ref 0.0–0.2)
Basos: 1 %
EOS (ABSOLUTE): 0.2 10*3/uL (ref 0.0–0.4)
Eos: 4 %
Hematocrit: 42.3 % (ref 37.5–51.0)
Hemoglobin: 14.1 g/dL (ref 13.0–17.7)
Immature Grans (Abs): 0 10*3/uL (ref 0.0–0.1)
Immature Granulocytes: 0 %
Lymphocytes Absolute: 1.4 10*3/uL (ref 0.7–3.1)
Lymphs: 25 %
MCH: 30.5 pg (ref 26.6–33.0)
MCHC: 33.3 g/dL (ref 31.5–35.7)
MCV: 92 fL (ref 79–97)
Monocytes Absolute: 0.4 10*3/uL (ref 0.1–0.9)
Monocytes: 7 %
Neutrophils Absolute: 3.6 10*3/uL (ref 1.4–7.0)
Neutrophils: 63 %
Platelets: 252 10*3/uL (ref 150–450)
RBC: 4.62 x10E6/uL (ref 4.14–5.80)
RDW: 13.4 % (ref 11.6–15.4)
WBC: 5.7 10*3/uL (ref 3.4–10.8)

## 2021-07-10 LAB — CARDIOVASCULAR RISK ASSESSMENT

## 2021-07-23 ENCOUNTER — Ambulatory Visit: Payer: Medicare HMO

## 2021-08-14 DIAGNOSIS — H2513 Age-related nuclear cataract, bilateral: Secondary | ICD-10-CM | POA: Diagnosis not present

## 2021-08-14 DIAGNOSIS — H401131 Primary open-angle glaucoma, bilateral, mild stage: Secondary | ICD-10-CM | POA: Diagnosis not present

## 2021-08-14 DIAGNOSIS — H34831 Tributary (branch) retinal vein occlusion, right eye, with macular edema: Secondary | ICD-10-CM | POA: Diagnosis not present

## 2021-09-12 DIAGNOSIS — H34831 Tributary (branch) retinal vein occlusion, right eye, with macular edema: Secondary | ICD-10-CM | POA: Diagnosis not present

## 2021-09-23 ENCOUNTER — Other Ambulatory Visit: Payer: Self-pay | Admitting: Nurse Practitioner

## 2021-09-23 DIAGNOSIS — M1990 Unspecified osteoarthritis, unspecified site: Secondary | ICD-10-CM

## 2021-09-23 DIAGNOSIS — M5441 Lumbago with sciatica, right side: Secondary | ICD-10-CM

## 2021-10-09 ENCOUNTER — Encounter: Payer: Self-pay | Admitting: Nurse Practitioner

## 2021-10-09 ENCOUNTER — Ambulatory Visit (INDEPENDENT_AMBULATORY_CARE_PROVIDER_SITE_OTHER): Payer: Medicare HMO | Admitting: Nurse Practitioner

## 2021-10-09 VITALS — BP 128/76 | HR 112 | Temp 97.4°F | Ht 73.0 in | Wt 226.0 lb

## 2021-10-09 DIAGNOSIS — R0600 Dyspnea, unspecified: Secondary | ICD-10-CM

## 2021-10-09 DIAGNOSIS — R0602 Shortness of breath: Secondary | ICD-10-CM | POA: Diagnosis not present

## 2021-10-09 DIAGNOSIS — R778 Other specified abnormalities of plasma proteins: Secondary | ICD-10-CM

## 2021-10-09 DIAGNOSIS — I700 Atherosclerosis of aorta: Secondary | ICD-10-CM | POA: Diagnosis not present

## 2021-10-09 DIAGNOSIS — I083 Combined rheumatic disorders of mitral, aortic and tricuspid valves: Secondary | ICD-10-CM | POA: Diagnosis not present

## 2021-10-09 DIAGNOSIS — J680 Bronchitis and pneumonitis due to chemicals, gases, fumes and vapors: Secondary | ICD-10-CM

## 2021-10-09 DIAGNOSIS — Z79899 Other long term (current) drug therapy: Secondary | ICD-10-CM | POA: Diagnosis not present

## 2021-10-09 DIAGNOSIS — R079 Chest pain, unspecified: Secondary | ICD-10-CM | POA: Diagnosis not present

## 2021-10-09 DIAGNOSIS — I214 Non-ST elevation (NSTEMI) myocardial infarction: Secondary | ICD-10-CM | POA: Diagnosis not present

## 2021-10-09 DIAGNOSIS — I219 Acute myocardial infarction, unspecified: Secondary | ICD-10-CM | POA: Diagnosis not present

## 2021-10-09 DIAGNOSIS — I350 Nonrheumatic aortic (valve) stenosis: Secondary | ICD-10-CM | POA: Diagnosis not present

## 2021-10-09 DIAGNOSIS — Z77098 Contact with and (suspected) exposure to other hazardous, chiefly nonmedicinal, chemicals: Secondary | ICD-10-CM

## 2021-10-09 DIAGNOSIS — T59811A Toxic effect of smoke, accidental (unintentional), initial encounter: Secondary | ICD-10-CM | POA: Diagnosis not present

## 2021-10-09 DIAGNOSIS — R918 Other nonspecific abnormal finding of lung field: Secondary | ICD-10-CM | POA: Diagnosis not present

## 2021-10-09 DIAGNOSIS — I5020 Unspecified systolic (congestive) heart failure: Secondary | ICD-10-CM | POA: Diagnosis not present

## 2021-10-09 DIAGNOSIS — I5021 Acute systolic (congestive) heart failure: Secondary | ICD-10-CM | POA: Diagnosis not present

## 2021-10-09 DIAGNOSIS — Z7982 Long term (current) use of aspirin: Secondary | ICD-10-CM | POA: Diagnosis not present

## 2021-10-09 DIAGNOSIS — I080 Rheumatic disorders of both mitral and aortic valves: Secondary | ICD-10-CM | POA: Diagnosis not present

## 2021-10-09 DIAGNOSIS — Z87891 Personal history of nicotine dependence: Secondary | ICD-10-CM | POA: Diagnosis not present

## 2021-10-09 DIAGNOSIS — Z7901 Long term (current) use of anticoagulants: Secondary | ICD-10-CM | POA: Diagnosis not present

## 2021-10-09 DIAGNOSIS — I249 Acute ischemic heart disease, unspecified: Secondary | ICD-10-CM | POA: Diagnosis not present

## 2021-10-09 DIAGNOSIS — J449 Chronic obstructive pulmonary disease, unspecified: Secondary | ICD-10-CM | POA: Diagnosis not present

## 2021-10-09 DIAGNOSIS — M1990 Unspecified osteoarthritis, unspecified site: Secondary | ICD-10-CM | POA: Diagnosis not present

## 2021-10-09 MED ORDER — TRELEGY ELLIPTA 100-62.5-25 MCG/ACT IN AEPB: 1.0000 | INHALATION_SPRAY | Freq: Every day | RESPIRATORY_TRACT | 0 refills | Status: DC

## 2021-10-09 MED ORDER — ALBUTEROL SULFATE HFA 108 (90 BASE) MCG/ACT IN AERS: 2.0000 | INHALATION_SPRAY | Freq: Four times a day (QID) | RESPIRATORY_TRACT | 0 refills | Status: DC | PRN

## 2021-10-09 MED ORDER — AZITHROMYCIN 250 MG PO TABS: ORAL_TABLET | ORAL | 0 refills | Status: DC
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
## 2021-10-10 ENCOUNTER — Encounter (HOSPITAL_COMMUNITY)
Admission: AD | Disposition: A | Discharge status: Home / Self Care | Payer: Self-pay | Source: Other Acute Inpatient Hospital | Attending: Interventional Cardiology

## 2021-10-10 ENCOUNTER — Inpatient Hospital Stay (HOSPITAL_COMMUNITY): Payer: Medicare HMO

## 2021-10-10 ENCOUNTER — Inpatient Hospital Stay (HOSPITAL_COMMUNITY)
Admission: AD | Admit: 2021-10-10 | Discharge: 2021-10-16 | DRG: 280 | Disposition: A | Discharge status: Home / Self Care | Payer: Medicare HMO | Source: Other Acute Inpatient Hospital | Attending: Interventional Cardiology | Admitting: Interventional Cardiology

## 2021-10-10 ENCOUNTER — Ambulatory Visit (HOSPITAL_COMMUNITY): Admission: RE | Admit: 2021-10-10 | Payer: Medicare HMO | Source: Other Acute Inpatient Hospital

## 2021-10-10 DIAGNOSIS — I5020 Unspecified systolic (congestive) heart failure: Secondary | ICD-10-CM | POA: Diagnosis not present

## 2021-10-10 DIAGNOSIS — I252 Old myocardial infarction: Secondary | ICD-10-CM | POA: Diagnosis not present

## 2021-10-10 DIAGNOSIS — I214 Non-ST elevation (NSTEMI) myocardial infarction: Secondary | ICD-10-CM | POA: Diagnosis not present

## 2021-10-10 DIAGNOSIS — I340 Nonrheumatic mitral (valve) insufficiency: Secondary | ICD-10-CM | POA: Diagnosis not present

## 2021-10-10 DIAGNOSIS — I110 Hypertensive heart disease with heart failure: Secondary | ICD-10-CM | POA: Diagnosis present

## 2021-10-10 DIAGNOSIS — I2510 Atherosclerotic heart disease of native coronary artery without angina pectoris: Secondary | ICD-10-CM | POA: Diagnosis not present

## 2021-10-10 DIAGNOSIS — Z8249 Family history of ischemic heart disease and other diseases of the circulatory system: Secondary | ICD-10-CM | POA: Diagnosis not present

## 2021-10-10 DIAGNOSIS — I080 Rheumatic disorders of both mitral and aortic valves: Secondary | ICD-10-CM | POA: Diagnosis not present

## 2021-10-10 DIAGNOSIS — I2119 ST elevation (STEMI) myocardial infarction involving other coronary artery of inferior wall: Secondary | ICD-10-CM | POA: Diagnosis not present

## 2021-10-10 DIAGNOSIS — R0602 Shortness of breath: Secondary | ICD-10-CM | POA: Diagnosis not present

## 2021-10-10 DIAGNOSIS — R7303 Prediabetes: Secondary | ICD-10-CM | POA: Diagnosis not present

## 2021-10-10 DIAGNOSIS — I272 Pulmonary hypertension, unspecified: Secondary | ICD-10-CM | POA: Diagnosis not present

## 2021-10-10 DIAGNOSIS — I700 Atherosclerosis of aorta: Secondary | ICD-10-CM | POA: Diagnosis not present

## 2021-10-10 DIAGNOSIS — H409 Unspecified glaucoma: Secondary | ICD-10-CM | POA: Diagnosis present

## 2021-10-10 DIAGNOSIS — Z6829 Body mass index (BMI) 29.0-29.9, adult: Secondary | ICD-10-CM

## 2021-10-10 DIAGNOSIS — R2681 Unsteadiness on feet: Secondary | ICD-10-CM | POA: Diagnosis not present

## 2021-10-10 DIAGNOSIS — Z79899 Other long term (current) drug therapy: Secondary | ICD-10-CM

## 2021-10-10 DIAGNOSIS — E669 Obesity, unspecified: Secondary | ICD-10-CM | POA: Diagnosis present

## 2021-10-10 DIAGNOSIS — J449 Chronic obstructive pulmonary disease, unspecified: Secondary | ICD-10-CM | POA: Diagnosis not present

## 2021-10-10 DIAGNOSIS — E785 Hyperlipidemia, unspecified: Secondary | ICD-10-CM | POA: Diagnosis present

## 2021-10-10 DIAGNOSIS — K219 Gastro-esophageal reflux disease without esophagitis: Secondary | ICD-10-CM | POA: Diagnosis present

## 2021-10-10 DIAGNOSIS — E782 Mixed hyperlipidemia: Secondary | ICD-10-CM

## 2021-10-10 DIAGNOSIS — Z87891 Personal history of nicotine dependence: Secondary | ICD-10-CM | POA: Diagnosis not present

## 2021-10-10 DIAGNOSIS — I255 Ischemic cardiomyopathy: Secondary | ICD-10-CM | POA: Diagnosis not present

## 2021-10-10 DIAGNOSIS — M1990 Unspecified osteoarthritis, unspecified site: Secondary | ICD-10-CM | POA: Diagnosis not present

## 2021-10-10 DIAGNOSIS — I249 Acute ischemic heart disease, unspecified: Secondary | ICD-10-CM | POA: Diagnosis not present

## 2021-10-10 DIAGNOSIS — I5021 Acute systolic (congestive) heart failure: Secondary | ICD-10-CM | POA: Diagnosis present

## 2021-10-10 DIAGNOSIS — T59811A Toxic effect of smoke, accidental (unintentional), initial encounter: Secondary | ICD-10-CM | POA: Diagnosis not present

## 2021-10-10 DIAGNOSIS — I350 Nonrheumatic aortic (valve) stenosis: Secondary | ICD-10-CM | POA: Diagnosis not present

## 2021-10-10 DIAGNOSIS — I2111 ST elevation (STEMI) myocardial infarction involving right coronary artery: Secondary | ICD-10-CM | POA: Diagnosis not present

## 2021-10-10 DIAGNOSIS — I219 Acute myocardial infarction, unspecified: Secondary | ICD-10-CM | POA: Diagnosis not present

## 2021-10-10 DIAGNOSIS — I213 ST elevation (STEMI) myocardial infarction of unspecified site: Principal | ICD-10-CM

## 2021-10-10 DIAGNOSIS — I083 Combined rheumatic disorders of mitral, aortic and tricuspid valves: Secondary | ICD-10-CM | POA: Diagnosis not present

## 2021-10-10 DIAGNOSIS — D649 Anemia, unspecified: Secondary | ICD-10-CM | POA: Diagnosis not present

## 2021-10-10 HISTORY — PX: LEFT HEART CATH AND CORONARY ANGIOGRAPHY: CATH118249

## 2021-10-10 HISTORY — DX: Non-ST elevation (NSTEMI) myocardial infarction: I21.4

## 2021-10-10 LAB — ECHOCARDIOGRAM COMPLETE
AR max vel: 1.38 cm2
AV Area VTI: 1.5 cm2
AV Area mean vel: 1.4 cm2
AV Mean grad: 6 mmHg
AV Peak grad: 11 mmHg
Ao pk vel: 1.66 m/s
Area-P 1/2: 4.41 cm2
Calc EF: 25.7 %
P 1/2 time: 434 msec
S' Lateral: 4.8 cm
Single Plane A2C EF: 27.1 %
Single Plane A4C EF: 23.2 %

## 2021-10-10 LAB — COMPREHENSIVE METABOLIC PANEL
ALT: 47 U/L — ABNORMAL HIGH (ref 0–44)
AST: 98 U/L — ABNORMAL HIGH (ref 15–41)
Albumin: 2.8 g/dL — ABNORMAL LOW (ref 3.5–5.0)
Alkaline Phosphatase: 51 U/L (ref 38–126)
Anion gap: 9 (ref 5–15)
BUN: 38 mg/dL — ABNORMAL HIGH (ref 8–23)
CO2: 23 mmol/L (ref 22–32)
Calcium: 8.3 mg/dL — ABNORMAL LOW (ref 8.9–10.3)
Chloride: 102 mmol/L (ref 98–111)
Creatinine, Ser: 1.21 mg/dL (ref 0.61–1.24)
GFR, Estimated: >60 mL/min (ref >60)
Glucose, Bld: 127 mg/dL — ABNORMAL HIGH (ref 70–99)
Potassium: 3.7 mmol/L (ref 3.5–5.1)
Sodium: 134 mmol/L — ABNORMAL LOW (ref 135–145)
Total Bilirubin: 1 mg/dL (ref 0.3–1.2)
Total Protein: 6.6 g/dL (ref 6.5–8.1)

## 2021-10-10 LAB — TSH: TSH: 1.06 u[IU]/mL (ref 0.350–4.500)

## 2021-10-10 LAB — BRAIN NATRIURETIC PEPTIDE: B Natriuretic Peptide: 1564.6 pg/mL — ABNORMAL HIGH (ref 0.0–100.0)

## 2021-10-10 SURGERY — LEFT HEART CATH AND CORONARY ANGIOGRAPHY
Anesthesia: LOCAL

## 2021-10-10 MED ORDER — MIDAZOLAM HCL 2 MG/2ML IJ SOLN
INTRAMUSCULAR | Status: AC
  Filled 2021-10-10: qty 2

## 2021-10-10 MED ORDER — HEPARIN SODIUM (PORCINE) 1000 UNIT/ML IJ SOLN
INTRAMUSCULAR | Status: DC | PRN
Start: 2021-10-10 — End: 2021-10-10
  Administered 2021-10-10: 5000 [IU] via INTRAVENOUS

## 2021-10-10 MED ORDER — IOHEXOL 350 MG/ML SOLN
INTRAVENOUS | Status: DC | PRN
  Administered 2021-10-10: 65 mL

## 2021-10-10 MED ORDER — CLOPIDOGREL BISULFATE 75 MG PO TABS
75.0000 mg | ORAL_TABLET | Freq: Every day | ORAL | Status: DC
  Administered 2021-10-11 – 2021-10-12 (×2): 75 mg via ORAL
  Filled 2021-10-10 (×2): qty 1

## 2021-10-10 MED ORDER — ASPIRIN EC 81 MG PO TBEC
81.0000 mg | DELAYED_RELEASE_TABLET | Freq: Every day | ORAL | Status: DC
  Administered 2021-10-11 – 2021-10-16 (×6): 81 mg via ORAL
  Filled 2021-10-10 (×6): qty 1

## 2021-10-10 MED ORDER — HEPARIN (PORCINE) 25000 UT/250ML-% IV SOLN
2600.0000 [IU]/h | INTRAVENOUS | Status: DC
  Administered 2021-10-10: 1500 [IU]/h via INTRAVENOUS
  Administered 2021-10-11: 2000 [IU]/h via INTRAVENOUS
  Administered 2021-10-11: 1850 [IU]/h via INTRAVENOUS
  Administered 2021-10-12 (×2): 2250 [IU]/h via INTRAVENOUS
  Administered 2021-10-13: 2400 [IU]/h via INTRAVENOUS
  Filled 2021-10-10 (×6): qty 250

## 2021-10-10 MED ORDER — ONDANSETRON HCL 4 MG/2ML IJ SOLN: 4.0000 mg | Freq: Four times a day (QID) | INTRAMUSCULAR | Status: DC | PRN

## 2021-10-10 MED ORDER — HEPARIN SODIUM (PORCINE) 1000 UNIT/ML IJ SOLN
INTRAMUSCULAR | Status: AC
  Filled 2021-10-10: qty 10

## 2021-10-10 MED ORDER — FENTANYL CITRATE (PF) 100 MCG/2ML IJ SOLN
INTRAMUSCULAR | Status: DC | PRN
  Administered 2021-10-10: 25 ug via INTRAVENOUS

## 2021-10-10 MED ORDER — HYDRALAZINE HCL 20 MG/ML IJ SOLN: 10.0000 mg | INTRAMUSCULAR | Status: AC | PRN

## 2021-10-10 MED ORDER — LIDOCAINE HCL (PF) 1 % IJ SOLN
INTRAMUSCULAR | Status: DC | PRN
  Administered 2021-10-10: 2 mL

## 2021-10-10 MED ORDER — VERAPAMIL HCL 2.5 MG/ML IV SOLN
INTRAVENOUS | Status: AC
  Filled 2021-10-10: qty 2

## 2021-10-10 MED ORDER — SODIUM CHLORIDE 0.9% FLUSH: 3.0000 mL | INTRAVENOUS | Status: DC | PRN

## 2021-10-10 MED ORDER — ATORVASTATIN CALCIUM 80 MG PO TABS
80.0000 mg | ORAL_TABLET | Freq: Every day | ORAL | Status: DC
  Administered 2021-10-10 – 2021-10-16 (×7): 80 mg via ORAL
  Filled 2021-10-10 (×7): qty 1

## 2021-10-10 MED ORDER — SODIUM CHLORIDE 0.9 % IV SOLN
250.0000 mL | INTRAVENOUS | Status: DC | PRN
Start: 2021-10-10 — End: 2021-10-16

## 2021-10-10 MED ORDER — CLOPIDOGREL BISULFATE 75 MG PO TABS
600.0000 mg | ORAL_TABLET | Freq: Once | ORAL | Status: AC
  Administered 2021-10-10: 600 mg via ORAL
  Filled 2021-10-10: qty 8

## 2021-10-10 MED ORDER — VERAPAMIL HCL 2.5 MG/ML IV SOLN
INTRAVENOUS | Status: DC | PRN
  Administered 2021-10-10: 10 mL via INTRA_ARTERIAL

## 2021-10-10 MED ORDER — CLOPIDOGREL BISULFATE 75 MG PO TABS: 75.0000 mg | ORAL_TABLET | Freq: Every day | ORAL | Status: DC

## 2021-10-10 MED ORDER — ALBUTEROL SULFATE (2.5 MG/3ML) 0.083% IN NEBU: 3.0000 mL | INHALATION_SOLUTION | Freq: Four times a day (QID) | RESPIRATORY_TRACT | Status: DC | PRN

## 2021-10-10 MED ORDER — MIDAZOLAM HCL 2 MG/2ML IJ SOLN
INTRAMUSCULAR | Status: DC | PRN
  Administered 2021-10-10: 2 mg via INTRAVENOUS

## 2021-10-10 MED ORDER — GABAPENTIN 300 MG PO CAPS
300.0000 mg | ORAL_CAPSULE | Freq: Every day | ORAL | Status: DC
  Administered 2021-10-10 – 2021-10-15 (×6): 300 mg via ORAL
  Filled 2021-10-10 (×6): qty 1

## 2021-10-10 MED ORDER — FLUTICASONE FUROATE-VILANTEROL 100-25 MCG/ACT IN AEPB
1.0000 | INHALATION_SPRAY | Freq: Every day | RESPIRATORY_TRACT | Status: DC
  Administered 2021-10-11 – 2021-10-16 (×6): 1 via RESPIRATORY_TRACT
  Filled 2021-10-10: qty 28

## 2021-10-10 MED ORDER — FUROSEMIDE 10 MG/ML IJ SOLN
40.0000 mg | Freq: Two times a day (BID) | INTRAMUSCULAR | Status: DC
Start: 2021-10-11 — End: 2021-10-10

## 2021-10-10 MED ORDER — LABETALOL HCL 5 MG/ML IV SOLN: 10.0000 mg | INTRAVENOUS | Status: AC | PRN

## 2021-10-10 MED ORDER — FUROSEMIDE 10 MG/ML IJ SOLN
40.0000 mg | Freq: Once | INTRAMUSCULAR | Status: AC
  Administered 2021-10-10: 40 mg via INTRAVENOUS
  Filled 2021-10-10: qty 4

## 2021-10-10 MED ORDER — LIDOCAINE HCL (PF) 1 % IJ SOLN
INTRAMUSCULAR | Status: AC
  Filled 2021-10-10: qty 30

## 2021-10-10 MED ORDER — HEPARIN (PORCINE) 25000 UT/250ML-% IV SOLN: 1500.0000 [IU]/h | INTRAVENOUS | Status: DC

## 2021-10-10 MED ORDER — SPIRONOLACTONE 12.5 MG HALF TABLET
12.5000 mg | ORAL_TABLET | Freq: Every day | ORAL | Status: DC
  Administered 2021-10-10 – 2021-10-14 (×5): 12.5 mg via ORAL
  Filled 2021-10-10 (×5): qty 1

## 2021-10-10 MED ORDER — HEPARIN (PORCINE) IN NACL 1000-0.9 UT/500ML-% IV SOLN
INTRAVENOUS | Status: DC | PRN
  Administered 2021-10-10 (×2): 500 mL

## 2021-10-10 MED ORDER — HEPARIN (PORCINE) IN NACL 1000-0.9 UT/500ML-% IV SOLN
INTRAVENOUS | Status: AC
  Filled 2021-10-10: qty 1000

## 2021-10-10 MED ORDER — LATANOPROST 0.005 % OP SOLN
1.0000 [drp] | Freq: Every day | OPHTHALMIC | Status: DC
  Administered 2021-10-10 – 2021-10-15 (×6): 1 [drp] via OPHTHALMIC
  Filled 2021-10-10: qty 2.5

## 2021-10-10 MED ORDER — UMECLIDINIUM BROMIDE 62.5 MCG/ACT IN AEPB
1.0000 | INHALATION_SPRAY | Freq: Every day | RESPIRATORY_TRACT | Status: DC
  Administered 2021-10-11 – 2021-10-15 (×5): 1 via RESPIRATORY_TRACT
  Filled 2021-10-10 (×2): qty 7

## 2021-10-10 MED ORDER — ACETAMINOPHEN 325 MG PO TABS
650.0000 mg | ORAL_TABLET | ORAL | Status: DC | PRN
  Administered 2021-10-12 – 2021-10-15 (×3): 650 mg via ORAL
  Filled 2021-10-10 (×2): qty 2

## 2021-10-10 MED ORDER — FENTANYL CITRATE (PF) 100 MCG/2ML IJ SOLN
INTRAMUSCULAR | Status: AC
  Filled 2021-10-10: qty 2

## 2021-10-10 MED ORDER — SODIUM CHLORIDE 0.9% FLUSH
3.0000 mL | Freq: Two times a day (BID) | INTRAVENOUS | Status: DC
  Administered 2021-10-10 – 2021-10-15 (×3): 3 mL via INTRAVENOUS

## 2021-10-10 SURGICAL SUPPLY — 9 items

## 2021-10-10 NOTE — Progress Notes (Signed)
?  Echocardiogram ?2D Echocardiogram has been performed. ? ?Keith Jenkins ?10/10/2021, 3:48 PM ?

## 2021-10-10 NOTE — Progress Notes (Addendum)
ANTICOAGULATION CONSULT NOTE - Initial Consult ? ?Pharmacy Consult for heparin ?Indication: chest pain/ACS ? ?No Known Allergies ? ?Patient Measurements: ?  ?Heparin Dosing Weight: 100kg ? ?Vital Signs: ?Temp: 98.1 ?F (36.7 ?C) (05/04 1419) ?Temp Source: Axillary (05/04 1419) ?BP: 101/77 (05/04 1419) ?Pulse Rate: 84 (05/04 1419) ? ?Labs: ?No results for input(s): HGB, HCT, PLT, APTT, LABPROT, INR, HEPARINUNFRC, HEPRLOWMOCWT, CREATININE, CKTOTAL, CKMB, TROPONINIHS in the last 72 hours. ? ?CrCl cannot be calculated (Patient's most recent lab result is older than the maximum 21 days allowed.). ? ? ?Medical History: ?Past Medical History:  ?Diagnosis Date  ? Arthritis   ? Glaucoma   ? ? ?Medications:  ?Medications Prior to Admission  ?Medication Sig Dispense Refill Last Dose  ? albuterol (VENTOLIN HFA) 108 (90 Base) MCG/ACT inhaler Inhale 2 puffs into the lungs every 6 (six) hours as needed for wheezing or shortness of breath. 8 g 0 Past Week  ? esomeprazole (NEXIUM) 40 MG capsule Take 40 mg by mouth daily at 12 noon.   10/08/2021  ? Fluticasone-Umeclidin-Vilant (TRELEGY ELLIPTA) 100-62.5-25 MCG/ACT AEPB Inhale 1 puff into the lungs daily. 2 each 0 Past Week  ? gabapentin (NEURONTIN) 300 MG capsule Take 1 capsule (300 mg total) by mouth at bedtime. 90 capsule 0 10/08/2021  ? latanoprost (XALATAN) 0.005 % ophthalmic solution Place 1 drop into both eyes at bedtime.   10/09/2021  ? meloxicam (MOBIC) 7.5 MG tablet TAKE ONE TABLET BY MOUTH EVERY MORNING AND AT BEDTIME WITH FOOD (Patient taking differently: Take 7.5 mg by mouth 2 (two) times daily.) 180 tablet 0 10/08/2021  ? OVER THE COUNTER MEDICATION Apply 1 application. topically daily as needed (For back pain). Medication: Magnilife Cream   10/08/2021  ? azithromycin (ZITHROMAX) 250 MG tablet Take 2 tablets on day 1, then 1 tablet daily on days 2 through 5 (Patient not taking: Reported on 10/10/2021) 6 tablet 0 Not Taking  ? sildenafil (VIAGRA) 50 MG tablet Take 1 tablet (50 mg  total) by mouth daily as needed for erectile dysfunction. (Patient not taking: Reported on 10/10/2021) 10 tablet 0 Not Taking  ? tadalafil (CIALIS) 10 MG tablet Take 1 tablet (10 mg total) by mouth every other day as needed for erectile dysfunction. (Patient not taking: Reported on 10/10/2021) 10 tablet 1 Not Taking  ? ?Scheduled:  ? [START ON 10/11/2021] aspirin EC  81 mg Oral Daily  ? atorvastatin  80 mg Oral Daily  ? clopidogrel  600 mg Oral Once  ? [START ON 10/11/2021] clopidogrel  75 mg Oral Daily  ? fluticasone furoate-vilanterol  1 puff Inhalation Daily  ? And  ? umeclidinium bromide  1 puff Inhalation Daily  ? furosemide  40 mg Intravenous Once  ? gabapentin  300 mg Oral QHS  ? latanoprost  1 drop Both Eyes QHS  ? sodium chloride flush  3 mL Intravenous Q12H  ? spironolactone  12.5 mg Oral Daily  ? ?Infusions:  ? sodium chloride    ? heparin    ? ? ?Assessment: ?Pt presented with CP and HF. He is s/p cath with LAD disease. Plan to PCI at a later date. Start heparin for anticoagulation. No anticoagulation prior to admission. Ok to start heparin after TR band removed for 2 hrs - confirmed by RN ? ?Scr <1 ?Cbc wnl ?Goal of Therapy:  ?Heparin level 0.3-0.7 units/ml ?Monitor platelets by anticoagulation protocol: Yes ?  ?Plan:  ?Start heparin at 1500 units/hr ?Check 8 hr HL then daily ?F/u plan for  PCI ? ?Ulyses Southward, PharmD, BCIDP, AAHIVP, CPP ?Infectious Disease Pharmacist ?10/10/2021 4:07 PM ? ? ? ? ?
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
## 2021-10-11 ENCOUNTER — Other Ambulatory Visit: Payer: Self-pay

## 2021-10-11 ENCOUNTER — Inpatient Hospital Stay: Payer: Self-pay

## 2021-10-11 ENCOUNTER — Encounter (HOSPITAL_COMMUNITY): Payer: Self-pay | Admitting: Interventional Cardiology

## 2021-10-11 DIAGNOSIS — I2111 ST elevation (STEMI) myocardial infarction involving right coronary artery: Secondary | ICD-10-CM | POA: Diagnosis not present

## 2021-10-11 DIAGNOSIS — I214 Non-ST elevation (NSTEMI) myocardial infarction: Secondary | ICD-10-CM | POA: Diagnosis not present

## 2021-10-11 DIAGNOSIS — E782 Mixed hyperlipidemia: Secondary | ICD-10-CM | POA: Diagnosis not present

## 2021-10-11 DIAGNOSIS — I5021 Acute systolic (congestive) heart failure: Secondary | ICD-10-CM | POA: Diagnosis not present

## 2021-10-11 LAB — CBC
HCT: 34.4 % — ABNORMAL LOW (ref 39.0–52.0)
Hemoglobin: 12.2 g/dL — ABNORMAL LOW (ref 13.0–17.0)
MCH: 30.5 pg (ref 26.0–34.0)
MCHC: 35.5 g/dL (ref 30.0–36.0)
MCV: 86 fL (ref 80.0–100.0)
Platelets: 234 10*3/uL (ref 150–400)
RBC: 4 MIL/uL — ABNORMAL LOW (ref 4.22–5.81)
RDW: 14.1 % (ref 11.5–15.5)
WBC: 10.2 10*3/uL (ref 4.0–10.5)
nRBC: 0 % (ref 0.0–0.2)

## 2021-10-11 LAB — COOXEMETRY PANEL
Carboxyhemoglobin: 0.9 % (ref 0.5–1.5)
Methemoglobin: <0.7 % (ref 0.0–1.5)
O2 Saturation: 58.5 %
Total hemoglobin: 12.6 g/dL (ref 12.0–16.0)

## 2021-10-11 LAB — BASIC METABOLIC PANEL
Anion gap: 9 (ref 5–15)
BUN: 41 mg/dL — ABNORMAL HIGH (ref 8–23)
CO2: 21 mmol/L — ABNORMAL LOW (ref 22–32)
Calcium: 8.1 mg/dL — ABNORMAL LOW (ref 8.9–10.3)
Chloride: 105 mmol/L (ref 98–111)
Creatinine, Ser: 1.29 mg/dL — ABNORMAL HIGH (ref 0.61–1.24)
GFR, Estimated: 58 mL/min — ABNORMAL LOW (ref >60)
Glucose, Bld: 117 mg/dL — ABNORMAL HIGH (ref 70–99)
Potassium: 3.7 mmol/L (ref 3.5–5.1)
Sodium: 135 mmol/L (ref 135–145)

## 2021-10-11 LAB — HEPARIN LEVEL (UNFRACTIONATED)
Heparin Unfractionated: <0.1 IU/mL — ABNORMAL LOW (ref 0.30–0.70)
Heparin Unfractionated: 0.1 IU/mL — ABNORMAL LOW (ref 0.30–0.70)

## 2021-10-11 MED ORDER — SODIUM CHLORIDE 0.9% FLUSH: 10.0000 mL | INTRAVENOUS | Status: DC | PRN

## 2021-10-11 MED ORDER — POTASSIUM CHLORIDE CRYS ER 20 MEQ PO TBCR
40.0000 meq | EXTENDED_RELEASE_TABLET | Freq: Once | ORAL | Status: AC
  Administered 2021-10-11: 40 meq via ORAL
  Filled 2021-10-11: qty 2

## 2021-10-11 MED ORDER — DIGOXIN 125 MCG PO TABS
0.1250 mg | ORAL_TABLET | Freq: Every day | ORAL | Status: DC
  Administered 2021-10-11 – 2021-10-16 (×6): 0.125 mg via ORAL
  Filled 2021-10-11 (×6): qty 1

## 2021-10-11 MED ORDER — CHLORHEXIDINE GLUCONATE CLOTH 2 % EX PADS
6.0000 | MEDICATED_PAD | Freq: Every day | CUTANEOUS | Status: DC
  Administered 2021-10-11 – 2021-10-16 (×6): 6 via TOPICAL

## 2021-10-11 MED ORDER — SODIUM CHLORIDE 0.9% FLUSH
10.0000 mL | Freq: Two times a day (BID) | INTRAVENOUS | Status: DC
  Administered 2021-10-11 – 2021-10-14 (×4): 10 mL
  Administered 2021-10-14 – 2021-10-15 (×2): 20 mL
  Administered 2021-10-15: 10 mL
  Administered 2021-10-16: 20 mL

## 2021-10-11 MED ORDER — FUROSEMIDE 10 MG/ML IJ SOLN
40.0000 mg | Freq: Once | INTRAMUSCULAR | Status: AC
  Administered 2021-10-11: 40 mg via INTRAVENOUS
  Filled 2021-10-11: qty 4

## 2021-10-11 MED ORDER — ENSURE ENLIVE PO LIQD
237.0000 mL | Freq: Two times a day (BID) | ORAL | Status: DC
  Administered 2021-10-11 – 2021-10-16 (×10): 237 mL via ORAL

## 2021-10-11 MED ORDER — DAPAGLIFLOZIN PROPANEDIOL 10 MG PO TABS
10.0000 mg | ORAL_TABLET | Freq: Every day | ORAL | Status: DC
  Administered 2021-10-11 – 2021-10-16 (×6): 10 mg via ORAL
  Filled 2021-10-11 (×6): qty 1

## 2021-10-11 NOTE — Progress Notes (Addendum)
1640: Order was placed for IV team to call nurse to discuss PICC line placement.  ? ?1706: Spoke to IV team and was advised that PICC line nurse will return my call.  ?

## 2021-10-11 NOTE — Discharge Instructions (Signed)
Heart-Healthy Nutrition Therapy ? ?A heart-healthy diet is recommended to reduce your unhealthy blood cholesterol levels, manage high blood pressure, and lower your risk for heart disease. ?To follow a heart-healthy diet, ?Eat a balanced diet with whole grains, fruits and vegetables, and lean protein sources. ?Achieve and maintain a healthy weight. ?Choose heart-healthy unsaturated fats. Limit saturated fats, trans fats, and cholesterol intake. Eat more plant-based or vegetarian meals using beans and soy foods for protein. ?Eat whole, unprocessed foods to limit the amount of sodium (salt) you eat. ?Limit refined carbohydrates especially sugar, sweets and sugar-sweetened beverages. ?If you drink alcohol, do so in moderation: one serving per day (women) and two servings per day (men). ?One serving is equivalent to 12 ounces beer, 5 ounces wine, or 1.5 ounces distilled spirits ? ?Tips ?Tips for Choosing Heart-Healthy Fats ?Choose lean protein and low-fat dairy foods to reduce saturated fat intake. ?Saturated fat is usually found in animal-based protein and is associated with certain health risks. Saturated fat is the biggest contributor to raised low-density lipoprotein (LDL) cholesterol levels in the diet. Research shows that limiting saturated fat lowers unhealthy cholesterol levels. Eat no more than 5-6% of your total calories each day from saturated fat. Ask your registered dietitian nutritionist (RDN) to help you determine how much saturated fat is right for you. ?There are many foods that do not contain large amounts of saturated fats. Swapping these foods to replace foods high in saturated fats will help you limit the saturated fat you eat and improve your cholesterol levels. You can also try eating more plant-based or vegetarian meals. ?Instead of? Try:  ?Whole milk, cheese, yogurt, and ice cream 1%, ?%, or skim milk, low-fat cheese, non-fat yogurt, and low-fat ice cream  ?Fatty, marbled beef and pork Lean  beef, pork, or venison  ?Poultry with skin Poultry without skin  ?Butter, stick margarine Reduced-fat, whipped, or liquid spreads  ?Coconut oil, palm oil Liquid vegetable oils: corn, canola, olive, soybean and safflower oils  ? ?Avoid trans fats. ?Trans fats increase levels of LDL-cholesterol. Hydrogenated fat in processed foods is the main source of trans fats in foods.  ?Trans fats can be found in stick margarine, shortening, processed sweets, baked goods, some fried foods, and packaged foods made with hydrogenated oils. Avoid foods with ?partially hydrogenated oil? on the ingredient list such as: cookies, pastries, baked goods, biscuits, crackers, microwave popcorn, and frozen dinners. ?Choose foods with heart healthy fats. ?Polyunsaturated and monounsaturated fat are unsaturated fats that may help lower your blood cholesterol level when used in place of saturated fat in your diet. ?Ask your RDN about taking a dietary supplement with plant sterols and stanols to help lower your cholesterol level. ?Research shows that substituting saturated fats with unsaturated fats is beneficial to cholesterol levels. Try these easy swaps: ?  ?Instead of? Try:  ?Butter, stick margarine, or solid shortening Reduced-fat, whipped, or liquid spreads  ?Beef, pork, or poultry with skin ?  Fish and seafood  ?Chips, crackers, snack foods Raw or unsalted nuts and seeds or nut butters ?Hummus with vegetables ?Avocado on toast  ?Coconut oil, palm oil Liquid vegetable oils: corn, canola, olive, soybean and safflower oils  ?  ?Limit the amount of cholesterol you eat to less than 200 milligrams per day. ?Cholesterol is a substance carried through the bloodstream via lipoproteins, which are known as ?transporters? of fat. Some body functions need cholesterol to work properly, but too much cholesterol in the bloodstream can damage arteries and build up blood  vessel linings (which can lead to heart attack and stroke). You should eat less than  200 milligrams cholesterol per day. ?People respond differently to eating cholesterol. There is no test available right now that can figure out which people will respond more to dietary cholesterol and which will respond less. For individuals with high intake of dietary cholesterol, different types of increase (none, small, moderate, large) in LDL-cholesterol levels are all possible.   ?Food sources of cholesterol include egg yolks and organ meats such as liver, gizzards.  Limit egg yolks to two to four per week and avoid organ meats like liver and gizzards to control cholesterol intake. ? ?Tips for Choosing Heart-Healthy Carbohydrates ?Consume foods rich in viscous (soluble) fiber ?Viscous, or soluble, fiber is found in the walls of plant cells. Viscous fiber is found only in plant-based foods--animal-based foods like meat or dairy products do not contain fiber. In the stomach, viscous fibers absorb water and swell to form a thick, jelly-like mass. This helps to lower your unhealthy cholesterol ?Rich sources of viscous fiber include asparagus, Brussels sprouts, sweet potatoes, turnips, apricots, mangoes, oranges, legumes, barley, oats, and oat bran. ?Eat at least 5 to 10 grams of viscous fiber each day. As you increase your fiber intake gradually, also increase the amount of water you drink. This will help prevent constipation. ?If you have difficulty achieving this goal, ask your RDN about fiber laxatives. Choose fiber supplements made with viscous fibers such as psyllium seed husks or methylcellulose to help lower unhealthy cholesterol. ? ?Limit refined carbohydrates ?There are three types of carbohydrates: starches, sugar, and fiber. Some carbohydrates occur naturally in food, like the starches in rice or corn or the sugars in fruits and milk. Refined carbohydrates--foods with high amounts of simple sugars--can raise triglyceride levels. High triglyceride levels are associated with coronary heart disease. ?Some  examples of refined carbohydrate foods are table sugar, sweets, and beverages sweetened with added sugar. ? ?Tips for Reducing Sodium (Salt) ?Although sodium is important for your body to function, too much sodium can be harmful for people with high blood pressure.  As sodium and fluid buildup in your tissues and bloodstream, your blood pressure increases. High blood pressure may cause damage to other organs and increase your risk for a stroke. ?Keep your salt intake to 2300 milligrams or less per day. Even if you take a pill for blood pressure or a water pill (diuretic) to remove fluid, it is still important to have less salt in your diet. Ask your RDN what amount of sodium is right for you. ?Avoid processed foods.  Eat more fresh foods. ?Fresh fruits and vegetables are naturally low in sodium, as well as frozen vegetables and fruits that have no added juices or sauces. ?Fresh meats are lower in sodium than processed meats, such as bacon, sausage, and hotdogs.  Read the nutrition label or ask your butcher to help you find a fresh meat that is low in sodium. ?Eat less salt--at the table and when cooking. ?A single teaspoon of table salt has 2,300 mg of sodium. ?Leave the salt out of recipes for pasta, casseroles, and soups. ?Ask your RDN how to cook your favorite recipes without sodium ?Be a Engineer, building services. ?Look for food packages that say ?salt-free? or ?sodium-free.? These items contain less than 5 milligrams of sodium per serving. ??Very low-sodium? products contain less than 35 milligrams of sodium per serving. ??Low-sodium? products contain less than 140 milligrams of sodium per serving.  ?Beware of ?reduced salt?  or "reduced sodium" products.  These items may still be high in sodium. Check the nutrition label. ? Add flavors to your food without adding sodium. ?Try lemon juice, lime juice, fruit juice or vinegar.   ?Dry or fresh herbs add flavor. Try basil, bay leaf, dill, rosemary, parsley, sage, dry mustard,  nutmeg, thyme, and paprika. ?Pepper, red pepper flakes, and cayenne pepper can add spice to your meals without adding sodium. Hot sauce contains sodium, but if you use just a drop or two, it will not add up

## 2021-10-11 NOTE — Progress Notes (Signed)
Initial Nutrition Assessment ? ?DOCUMENTATION CODES:  ? ?Not applicable ? ?INTERVENTION:  ?- Ensure Enlive po BID, each supplement provides 350 kcal and 20 grams of protein. ? ?- MVI with minerals daily ? ?- Added "Heart Healthy Nutrition Therapy" handout to AVS ? ?NUTRITION DIAGNOSIS:  ? ?Increased nutrient needs related to acute illness as evidenced by estimated needs. ? ?GOAL:  ? ?Patient will meet greater than or equal to 90% of their needs ? ?MONITOR:  ? ?PO intake, Supplement acceptance, Labs, Weight trends ? ?REASON FOR ASSESSMENT:  ? ?Malnutrition Screening Tool ?  ? ?ASSESSMENT:  ? ?Pt presented to [facility] 5/3 with chest pain and persistent SOB, found to have NSTEMI and was transferred to [facility] for medical management. No significant past medical history on file. ? ?Per Cardiology, plans for RHC at the time of staged intervention to the mid LAD on Monday  ? ?Spoke with pt at bedside, his wife was also present during assessment. Pt eating breakfast during visit. He reports eating 3 meals per day up until Saturday/Sunday when he began having heart attack symptoms. Since then he has eaten minimally and breakfast today was his first whole meal he has eaten since last weekend. He reports that within the last year he has begun to listen to his hunger and fullness cues, has cut back on sweets and has been more aware of his sodium intake recently to help sustain a healthier nutrition intake. His intake typically includes eggs, sausage, sweet tea, spam and peas.  ? ?Pt reports that he has had ongoing swallowing difficulties for which he previously had a MBS and is followed by MD outpatient for this and does not need a modified diet. He was prescribed omeprazole which helps with this. If food gets stuck, he endorses occasional vomiting for relief. He reports eating slow and chewing foods well and will take sips of beverages to help with ease of swallowing difficulties.    ? ?We discussed a heart healthy diet  with emphasis on lowering sodium intake, including more whole grains and fiber rich foods, and increasing fruits, vegetables and lean protein with each meal.  ? ?He reports a usual weight of 235 lbs and endorses minimal weight loss within the last year d/t diet changes, but recent weight remains stable. Reviewed weight history. Weight appears stable. Current admit weight 102.5 kg.  ? ?Medications reviewed ? ?Labs: BUN 41, Cr 1.29, AST 98, ALT 47 ? ?NUTRITION - FOCUSED PHYSICAL EXAM: ? ?Flowsheet Row Most Recent Value  ?Orbital Region Mild depletion  ?Upper Arm Region No depletion  ?Thoracic and Lumbar Region No depletion  ?Buccal Region Mild depletion  ?Temple Region No depletion  ?Clavicle Bone Region No depletion  ?Clavicle and Acromion Bone Region No depletion  ?Scapular Bone Region No depletion  ?Dorsal Hand No depletion  ?Patellar Region Mild depletion  ?Anterior Thigh Region No depletion  ?Posterior Calf Region No depletion  ?Edema (RD Assessment) None  ?Hair Reviewed  ?Eyes Reviewed  ?Mouth Reviewed  ?Skin Reviewed  ?Nails Reviewed  ? ?  ? ?Diet Order:   ?Diet Order   ? ?       ?  Diet Heart Room service appropriate? Yes; Fluid consistency: Thin  Diet effective now       ?  ? ?  ?  ? ?  ? ? ?EDUCATION NEEDS:  ? ?Education needs have been addressed ? ?Skin:  Skin Assessment: Reviewed RN Assessment ? ?Last BM:  PTA ? ?Height:  ? ?  Ht Readings from Last 1 Encounters:  ?10/10/21 6\' 1"  (1.854 m)  ? ? ?Weight:  ? ?Wt Readings from Last 1 Encounters:  ?10/10/21 102.5 kg  ? ? ?BMI:  Body mass index is 29.82 kg/m?. ? ?Estimated Nutritional Needs:  ? ?Kcal:  2400-2600 ? ?Protein:  120-135g ? ?Fluid:  >/=2.4L ? ?12/10/21, RDN, LDN ?Clinical Nutrition ?

## 2021-10-11 NOTE — Progress Notes (Signed)
Pt reports that he had one episode earlier in the day with feeling short of breath, but states that he was anxious due to all the visitors he had today.  ?

## 2021-10-11 NOTE — Progress Notes (Signed)
ANTICOAGULATION CONSULT NOTE ?Pharmacy Consult for heparin ?Indication: chest pain/ACS ?Brief A/P: Heparin level subtherapeutic Increase Heparin rate ? ?No Known Allergies ? ?Patient Measurements: ?Height: 6\' 1"  (185.4 cm) ?Weight: 102.5 kg (226 lb) ?IBW/kg (Calculated) : 79.9 ?Heparin Dosing Weight: 100kg ? ?Vital Signs: ?Temp: 98.7 ?F (37.1 ?C) (05/05 0040) ?Temp Source: Oral (05/05 0040) ?BP: 92/70 (05/05 0047) ?Pulse Rate: 92 (05/05 0040) ? ?Labs: ?Recent Labs  ?  10/10/21 ?1606 10/11/21 ?0143  ?HGB  --  12.2*  ?HCT  --  34.4*  ?PLT  --  234  ?HEPARINUNFRC  --  <0.10*  ?CREATININE 1.21 1.29*  ? ? ?Estimated Creatinine Clearance: 63.2 mL/min (A) (by C-G formula based on SCr of 1.29 mg/dL (H)). ? ?Assessment: ?76 y.o. male admitted with STEMI s/p cath, awaiting PCI, for heparin ? ?Goal of Therapy:  ?Heparin level 0.3-0.7 units/ml ?Monitor platelets by anticoagulation protocol: Yes ?  ?Plan:  ?Increase Heparin 1850 units/hr ?Check heparin level in 8 hours. ? ?68, PharmD, BCPS ?10/11/2021 3:05 AM ? ? ? ? ?
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
## 2021-10-11 NOTE — Progress Notes (Signed)
?  Transition of Care (TOC) Screening Note ? ? ?Patient Details  ?Name: Keith Jenkins ?Date of Birth: 17-Sep-1945 ? ? ?Transition of Care (TOC) CM/SW Contact:    ?Milas Gain, LCSWA ?Phone Number: ?10/11/2021, 11:40 AM ? ? ? ?Transition of Care Department [facility]) has reviewed patient and no TOC needs have been identified at this time. We will continue to monitor patient advancement through interdisciplinary progression rounds. If new patient transition needs arise, please place a TOC consult. ?  ?

## 2021-10-11 NOTE — Progress Notes (Signed)
?Cardiology Progress Note  ?Patient ID: Keith Jenkins ?MRN: [id number] ?DOB: 31-May-1946 ?Date of Encounter: 10/11/2021 ? ?Primary Cardiologist: None ? ?Subjective  ? ?Chief Complaint: SOB ? ?HPI: SOB with activity. No CP.  ? ?ROS:  ?All other ROS reviewed and negative. Pertinent positives noted in the HPI.    ? ?Inpatient Medications  ?Scheduled Meds: ? aspirin EC  81 mg Oral Daily  ? atorvastatin  80 mg Oral Daily  ? clopidogrel  75 mg Oral Daily  ? dapagliflozin propanediol  10 mg Oral Daily  ? digoxin  0.125 mg Oral Daily  ? feeding supplement  237 mL Oral BID BM  ? fluticasone furoate-vilanterol  1 puff Inhalation Daily  ? And  ? umeclidinium bromide  1 puff Inhalation Daily  ? gabapentin  300 mg Oral QHS  ? latanoprost  1 drop Both Eyes QHS  ? sodium chloride flush  3 mL Intravenous Q12H  ? spironolactone  12.5 mg Oral Daily  ? ?Continuous Infusions: ? sodium chloride    ? heparin 1,850 Units/hr (10/11/21 0331)  ? ?PRN Meds: ?sodium chloride, acetaminophen, albuterol, ondansetron (ZOFRAN) IV, sodium chloride flush  ? ?Vital Signs  ? ?Vitals:  ? 10/10/21 2145 10/11/21 0040 10/11/21 0047 10/11/21 0610  ?BP:  (!) 84/60 92/70 101/73  ?Pulse:  92  86  ?Resp:  17 20 18   ?Temp:  98.7 ?F (37.1 ?C)  98.2 ?F (36.8 ?C)  ?TempSrc:  Oral  Oral  ?SpO2:  98% 94% 93%  ?Weight: 102.5 kg     ?Height: 6\' 1"  (1.854 m)     ? ? ?Intake/Output Summary (Last 24 hours) at 10/11/2021 0658 ?Last data filed at 10/11/2021 0331 ?Gross per 24 hour  ?Intake 368.88 ml  ?Output 850 ml  ?Net -481.12 ml  ? ? ?  10/10/2021  ?  9:45 PM 10/09/2021  ?  8:22 AM 07/09/2021  ? 10:03 AM  ?Last 3 Weights  ?Weight (lbs) 226 lb 226 lb 227 lb  ?Weight (kg) 102.513 kg 102.513 kg 102.967 kg  ?   ? ?Telemetry  ?Overnight telemetry shows SR 90s, which I personally reviewed.  ? ? ?Physical Exam  ? ?Vitals:  ? 10/10/21 2145 10/11/21 0040 10/11/21 0047 10/11/21 0610  ?BP:  (!) 84/60 92/70 101/73  ?Pulse:  92  86  ?Resp:  17 20 18   ?Temp:  98.7 ?F (37.1 ?C)  98.2 ?F (36.8 ?C)   ?TempSrc:  Oral  Oral  ?SpO2:  98% 94% 93%  ?Weight: 102.5 kg     ?Height: 6\' 1"  (1.854 m)     ?  ?Intake/Output Summary (Last 24 hours) at 10/11/2021 0658 ?Last data filed at 10/11/2021 0331 ?Gross per 24 hour  ?Intake 368.88 ml  ?Output 850 ml  ?Net -481.12 ml  ?  ? ?  10/10/2021  ?  9:45 PM 10/09/2021  ?  8:22 AM 07/09/2021  ? 10:03 AM  ?Last 3 Weights  ?Weight (lbs) 226 lb 226 lb 227 lb  ?Weight (kg) 102.513 kg 102.513 kg 102.967 kg  ?  Body mass index is 29.82 kg/m?.  ?General: Well nourished, well developed, in no acute distress ?Head: Atraumatic, normal size  ?Eyes: PEERLA, EOMI  ?Neck: Supple, no JVD ?Endocrine: No thryomegaly ?Cardiac: Normal S1, S2; RRR; no murmurs, rubs, or gallops ?Lungs: Clear to auscultation bilaterally, no wheezing, rhonchi or rales  ?Abd: Soft, nontender, no hepatomegaly  ?Ext: No edema, pulses 2+ ?Musculoskeletal: No deformities, BUE and BLE strength normal and equal ?Skin: Warm  and dry, no rashes   ?Neuro: Alert and oriented to person, place, time, and situation, CNII-XII grossly intact, no focal deficits  ?Psych: Normal mood and affect  ? ?Labs  ?High Sensitivity Troponin:  No results for input(s): TROPONINIHS in the last 720 hours.   ?Cardiac EnzymesNo results for input(s): TROPONINI in the last 168 hours. No results for input(s): TROPIPOC in the last 168 hours.  ?Chemistry ?Recent Labs  ?Lab 10/10/21 ?1606 10/11/21 ?0143  ?NA 134* 135  ?K 3.7 3.7  ?CL 102 105  ?CO2 23 21*  ?GLUCOSE 127* 117*  ?BUN 38* 41*  ?CREATININE 1.21 1.29*  ?CALCIUM 8.3* 8.1*  ?PROT 6.6  --   ?ALBUMIN 2.8*  --   ?AST 98*  --   ?ALT 47*  --   ?ALKPHOS 51  --   ?BILITOT 1.0  --   ?GFRNONAA >60 58*  ?ANIONGAP 9 9  ?  ?Hematology ?Recent Labs  ?Lab 10/11/21 ?0143  ?WBC 10.2  ?RBC 4.00*  ?HGB 12.2*  ?HCT 34.4*  ?MCV 86.0  ?MCH 30.5  ?MCHC 35.5  ?RDW 14.1  ?PLT 234  ? ?BNP ?Recent Labs  ?Lab 10/10/21 ?1606  ?BNP 1,564.6*  ?  ?DDimer No results for input(s): DDIMER in the last 168 hours.  ? ?Radiology  ?CARDIAC  CATHETERIZATION ? ?Result Date: 10/10/2021 ?  Mid RCA lesion is 100% stenosed.  Faint left to right collaterals.   Mid LAD lesion is 90% stenosed, at the origin of a large septal.   Ost LAD to Prox LAD lesion is 40% stenosed.   There is severe left ventricular systolic dysfunction.   LV end diastolic pressure is moderately elevated.   The left ventricular ejection fraction is less than 25% by visual estimate.   There is no aortic valve stenosis. RCA occlusion was likely the culprit.  I suspect this occurred on Fri/Sat of last week.  He has been pain free for several days.  Completed inferior infarct.  BP dropped with sedation and improved quickly post procedure He has residual LAD disease.  BP was low and LVEDP was high.  Plan to diurese and initiate CHF meds. Plan for LAD intervention at a later time.  Will have to load with antiplatelet therapy.  Plan for LAD PCI at a later time.  ? ?DG CHEST PORT 1 VIEW ? ?Result Date: 10/10/2021 ?CLINICAL DATA:  NSTEMI EXAM: PORTABLE CHEST 1 VIEW COMPARISON:  Portable exam 1536 hours compared to 10/09/2021 FINDINGS: Upper normal heart size. Mediastinal contours and pulmonary vascularity normal. Atherosclerotic calcification aorta. Question atelectasis versus infiltrate RIGHT lower lobe medially. Remaining lungs clear. No pleural effusion or pneumothorax. No acute osseous findings. IMPRESSION: Question atelectasis versus infiltrate in medial RIGHT lower lobe. Electronically Signed   By: Ulyses Southward M.D.   On: 10/10/2021 15:48  ? ?ECHOCARDIOGRAM COMPLETE ? ?Result Date: 10/10/2021 ?   ECHOCARDIOGRAM REPORT   Patient Name:   Keith Jenkins Date of Exam: 10/10/2021 Medical Rec #:  [id number]    Height:       73.0 in Accession #:    [id number]   Weight:       226.0 lb Date of Birth:  Jul 02, 1945    BSA:          2.266 m? Patient Age:    76 years     BP:           101/77 mmHg Patient Gender: M            HR:  88 bpm. Exam Location:  Inpatient Procedure: 2D Echo, Cardiac Doppler, Color  Doppler and 3D Echo                           STAT ECHO      Reported to: Dr Carolan Clines on 10/10/2021 3:52:00 PM Dr. Guinevere Scarlet reviewed echocardiogram after it was complete. Indications:    NSTEMI  History:        Patient has no prior history of Echocardiogram examinations.                 Acute MI; Risk Factors:Former Smoker.  Sonographer:    Ross Ludwig RDCS (AE) Referring Phys: [id number] Jonita Albee IMPRESSIONS  1. Akinesis of the inferior/ inferoseptal/ inferolateral wall, hypokinesis of the anterolateral wall basal-mid segments. Left ventricular ejection fraction, by estimation, is 30 to 35%. The left ventricle has moderately decreased function. The left ventricular internal cavity size was mildly to moderately dilated. There is mild left ventricular hypertrophy. Left ventricular diastolic parameters are indeterminate.  2. Right ventricular systolic function is moderately reduced. The right ventricular size is normal.  3. Left atrial size was moderately dilated.  4. Ischemic MR. The mitral valve is grossly normal. Mild to moderate mitral valve regurgitation.  5. The aortic valve is calcified. There is moderate calcification of the aortic valve. Aortic valve regurgitation is mild. Aortic valve sclerosis/calcification is present, without any evidence of aortic stenosis.  6. Aortic small aortic root aneurysm 4.3 cm.  7. The inferior vena cava is dilated in size with >50% respiratory variability, suggesting right atrial pressure of 8 mmHg. Comparison(s): No prior Echocardiogram. FINDINGS  Left Ventricle: Akinesis of the inferior/ inferoseptal/ inferolateral wall, hypokinesis of the anterolateral wall basal-mid segments. Left ventricular ejection fraction, by estimation, is 30 to 35%. The left ventricle has moderately decreased function. The left ventricular internal cavity size was mildly to moderately dilated. There is mild left ventricular hypertrophy. Left ventricular diastolic parameters are indeterminate.  Right Ventricle: The right ventricular size is normal. No increase in right ventricular wall thickness. Right ventricular systolic function is moderately reduced. Left Atrium: Left atrial size was moderately dilate

## 2021-10-11 NOTE — Progress Notes (Signed)
ANTICOAGULATION CONSULT NOTE - Follow Up Consult ? ?Pharmacy Consult for heparin ?Indication: chest pain/ACS ? ?No Known Allergies ? ?Patient Measurements: ?Height: 6\' 1"  (185.4 cm) ?Weight: 102.5 kg (226 lb) ?IBW/kg (Calculated) : 79.9 ?Heparin Dosing Weight: 100kg ? ?Vital Signs: ?Temp: 98.2 ?F (36.8 ?C) (05/05 06-28-2001) ?Temp Source: Oral (05/05 06-28-2001) ?BP: 108/78 (05/05 06-28-2001) ?Pulse Rate: 92 (05/05 1015) ? ?Labs: ?Recent Labs  ?  10/10/21 ?1606 10/11/21 ?0143 10/11/21 ?12/11/21  ?HGB  --  12.2*  --   ?HCT  --  34.4*  --   ?PLT  --  234  --   ?HEPARINUNFRC  --  <0.10* 0.10*  ?CREATININE 1.21 1.29*  --   ? ? ?Estimated Creatinine Clearance: 63.2 mL/min (A) (by C-G formula based on SCr of 1.29 mg/dL (H)). ? ? ?Medical History: ?Past Medical History:  ?Diagnosis Date  ? Arthritis   ? Glaucoma   ? ? ?Medications:  ?Medications Prior to Admission  ?Medication Sig Dispense Refill Last Dose  ? albuterol (VENTOLIN HFA) 108 (90 Base) MCG/ACT inhaler Inhale 2 puffs into the lungs every 6 (six) hours as needed for wheezing or shortness of breath. 8 g 0 Past Week  ? esomeprazole (NEXIUM) 40 MG capsule Take 40 mg by mouth daily at 12 noon.   10/08/2021  ? Fluticasone-Umeclidin-Vilant (TRELEGY ELLIPTA) 100-62.5-25 MCG/ACT AEPB Inhale 1 puff into the lungs daily. 2 each 0 Past Week  ? gabapentin (NEURONTIN) 300 MG capsule Take 1 capsule (300 mg total) by mouth at bedtime. 90 capsule 0 10/08/2021  ? latanoprost (XALATAN) 0.005 % ophthalmic solution Place 1 drop into both eyes at bedtime.   10/09/2021  ? meloxicam (MOBIC) 7.5 MG tablet TAKE ONE TABLET BY MOUTH EVERY MORNING AND AT BEDTIME WITH FOOD (Patient taking differently: Take 7.5 mg by mouth 2 (two) times daily.) 180 tablet 0 10/08/2021  ? OVER THE COUNTER MEDICATION Apply 1 application. topically daily as needed (For back pain). Medication: Magnilife Cream   10/08/2021  ? azithromycin (ZITHROMAX) 250 MG tablet Take 2 tablets on day 1, then 1 tablet daily on days 2 through 5 (Patient not  taking: Reported on 10/10/2021) 6 tablet 0 Not Taking  ? sildenafil (VIAGRA) 50 MG tablet Take 1 tablet (50 mg total) by mouth daily as needed for erectile dysfunction. (Patient not taking: Reported on 10/10/2021) 10 tablet 0 Not Taking  ? tadalafil (CIALIS) 10 MG tablet Take 1 tablet (10 mg total) by mouth every other day as needed for erectile dysfunction. (Patient not taking: Reported on 10/10/2021) 10 tablet 1 Not Taking  ? ?Scheduled:  ? aspirin EC  81 mg Oral Daily  ? atorvastatin  80 mg Oral Daily  ? clopidogrel  75 mg Oral Daily  ? dapagliflozin propanediol  10 mg Oral Daily  ? digoxin  0.125 mg Oral Daily  ? feeding supplement  237 mL Oral BID BM  ? fluticasone furoate-vilanterol  1 puff Inhalation Daily  ? And  ? umeclidinium bromide  1 puff Inhalation Daily  ? gabapentin  300 mg Oral QHS  ? latanoprost  1 drop Both Eyes QHS  ? sodium chloride flush  3 mL Intravenous Q12H  ? spironolactone  12.5 mg Oral Daily  ? ?Infusions:  ? sodium chloride    ? heparin 1,850 Units/hr (10/11/21 1027)  ? ? ?Assessment: ?Pt presented with CP and HF. He is s/p cath with LAD disease. Plan to PCI at a later date. Start heparin for anticoagulation. No anticoagulation prior to admission.  ?  Heparin drip 1850 uts/hr Heparin level 0.1 < goal cbc ok  ?No bleeding noted  ? ?Goal of Therapy:  ?Heparin level 0.3-0.7 units/ml ?Monitor platelets by anticoagulation protocol: Yes ?  ?Plan:  ?Increase heparin at 2000 units/hr ?Daily heparin level and CBC ? ? ? ?Leota Sauers Pharm.D. CPP, BCPS ?Clinical Pharmacist ?[phone] ?10/11/2021 12:45 PM  ? ? ? ? ?

## 2021-10-11 NOTE — Progress Notes (Signed)
Peripherally Inserted Central Catheter Placement ? ?The IV Nurse has discussed with the patient and/or persons authorized to consent for the patient, the purpose of this procedure and the potential benefits and risks involved with this procedure.  The benefits include less needle sticks, lab draws from the catheter, and the patient may be discharged home with the catheter. Risks include, but not limited to, infection, bleeding, blood clot (thrombus formation), and puncture of an artery; nerve damage and irregular heartbeat and possibility to perform a PICC exchange if needed/ordered by physician.  Alternatives to this procedure were also discussed.  Bard Power PICC patient education guide, fact sheet on infection prevention and patient information card has been provided to patient /or left at bedside.   ? ?PICC Placement Documentation  ?PICC Triple Lumen 10/11/21 Right Basilic 45 cm 0 cm (Active)  ?Indication for Insertion or Continuance of Line Chronic illness with exacerbations (CF, Sickle Cell, etc.) 10/11/21 1855  ?Exposed Catheter (cm) 0 cm 10/11/21 1855  ?Site Assessment Clean, Dry, Intact 10/11/21 1855  ?Lumen #1 Status Flushed;Saline locked;Blood return noted 10/11/21 1855  ?Lumen #2 Status Flushed;Saline locked;Blood return noted 10/11/21 1855  ?Lumen #3 Status Flushed;Saline locked;Blood return noted 10/11/21 1855  ?Dressing Type Securing device;Transparent 10/11/21 1855  ?Dressing Status Antimicrobial disc in place;Clean, Dry, Intact 10/11/21 1855  ?Dressing Intervention New dressing 10/11/21 1855  ?Dressing Change Due 10/18/21 10/11/21 1855  ? ? ? ? ? ?Curt Jews ?10/11/2021, 7:15 PM ? ?

## 2021-10-12 DIAGNOSIS — I340 Nonrheumatic mitral (valve) insufficiency: Secondary | ICD-10-CM

## 2021-10-12 DIAGNOSIS — I2111 ST elevation (STEMI) myocardial infarction involving right coronary artery: Secondary | ICD-10-CM | POA: Diagnosis not present

## 2021-10-12 DIAGNOSIS — I2510 Atherosclerotic heart disease of native coronary artery without angina pectoris: Secondary | ICD-10-CM

## 2021-10-12 LAB — CBC
HCT: 36.2 % — ABNORMAL LOW (ref 39.0–52.0)
Hemoglobin: 13 g/dL (ref 13.0–17.0)
MCH: 30.8 pg (ref 26.0–34.0)
MCHC: 35.9 g/dL (ref 30.0–36.0)
MCV: 85.8 fL (ref 80.0–100.0)
Platelets: 274 10*3/uL (ref 150–400)
RBC: 4.22 MIL/uL (ref 4.22–5.81)
RDW: 14.3 % (ref 11.5–15.5)
WBC: 10.6 10*3/uL — ABNORMAL HIGH (ref 4.0–10.5)
nRBC: 0 % (ref 0.0–0.2)

## 2021-10-12 LAB — BASIC METABOLIC PANEL
Anion gap: 8 (ref 5–15)
BUN: 34 mg/dL — ABNORMAL HIGH (ref 8–23)
CO2: 21 mmol/L — ABNORMAL LOW (ref 22–32)
Calcium: 8.4 mg/dL — ABNORMAL LOW (ref 8.9–10.3)
Chloride: 106 mmol/L (ref 98–111)
Creatinine, Ser: 1.13 mg/dL (ref 0.61–1.24)
GFR, Estimated: >60 mL/min (ref >60)
Glucose, Bld: 144 mg/dL — ABNORMAL HIGH (ref 70–99)
Potassium: 4.1 mmol/L (ref 3.5–5.1)
Sodium: 135 mmol/L (ref 135–145)

## 2021-10-12 LAB — HEPARIN LEVEL (UNFRACTIONATED)
Heparin Unfractionated: 0.16 IU/mL — ABNORMAL LOW (ref 0.30–0.70)
Heparin Unfractionated: 0.31 IU/mL (ref 0.30–0.70)
Heparin Unfractionated: 0.25 IU/mL — ABNORMAL LOW (ref 0.30–0.70)

## 2021-10-12 LAB — COOXEMETRY PANEL
Carboxyhemoglobin: 1.3 % (ref 0.5–1.5)
Methemoglobin: <0.7 % (ref 0.0–1.5)
O2 Saturation: 60.3 %
Total hemoglobin: 12.5 g/dL (ref 12.0–16.0)

## 2021-10-12 LAB — LIPOPROTEIN A (LPA): Lipoprotein (a): 138.1 nmol/L — ABNORMAL HIGH (ref <75.0)

## 2021-10-12 MED ORDER — MELATONIN 3 MG PO TABS
3.0000 mg | ORAL_TABLET | Freq: Every evening | ORAL | Status: DC | PRN
  Administered 2021-10-13 – 2021-10-15 (×3): 3 mg via ORAL
  Filled 2021-10-12 (×3): qty 1
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
## 2021-10-12 NOTE — Progress Notes (Signed)
ANTICOAGULATION CONSULT NOTE - Follow Up Consult ? ?Pharmacy Consult for heparin ?Indication: chest pain/ACS ? ?No Known Allergies ? ?Patient Measurements: ?Height: 6\' 1"  (185.4 cm) ?Weight: 98.8 kg (217 lb 14.4 oz) ?IBW/kg (Calculated) : 79.9 ?Heparin Dosing Weight: 100kg ? ?Vital Signs: ?Temp: 98 ?F (36.7 ?C) (05/06 2000) ?Temp Source: Oral (05/06 2000) ?BP: 111/79 (05/06 2000) ?Pulse Rate: 96 (05/06 1643) ? ?Labs: ?Recent Labs  ?  10/10/21 ?1606 10/11/21 ?0143 10/11/21 ?12/11/21 10/12/21 ?0419 10/12/21 ?0422 10/12/21 ?1201 10/12/21 ?2044  ?HGB  --  12.2*  --   --  13.0  --   --   ?HCT  --  34.4*  --   --  36.2*  --   --   ?PLT  --  234  --   --  274  --   --   ?HEPARINUNFRC  --  <0.10*   < > 0.16*  --  0.31 0.25*  ?CREATININE 1.21 1.29*  --   --   --  1.13  --   ? < > = values in this interval not displayed.  ? ? ? ?Estimated Creatinine Clearance: 71 mL/min (by C-G formula based on SCr of 1.13 mg/dL). ? ? ?Medical History: ?Past Medical History:  ?Diagnosis Date  ? Arthritis   ? Glaucoma   ? ? ?Medications:  ?Medications Prior to Admission  ?Medication Sig Dispense Refill Last Dose  ? albuterol (VENTOLIN HFA) 108 (90 Base) MCG/ACT inhaler Inhale 2 puffs into the lungs every 6 (six) hours as needed for wheezing or shortness of breath. 8 g 0 Past Week  ? esomeprazole (NEXIUM) 40 MG capsule Take 40 mg by mouth daily at 12 noon.   10/08/2021  ? Fluticasone-Umeclidin-Vilant (TRELEGY ELLIPTA) 100-62.5-25 MCG/ACT AEPB Inhale 1 puff into the lungs daily. 2 each 0 Past Week  ? gabapentin (NEURONTIN) 300 MG capsule Take 1 capsule (300 mg total) by mouth at bedtime. 90 capsule 0 10/08/2021  ? latanoprost (XALATAN) 0.005 % ophthalmic solution Place 1 drop into both eyes at bedtime.   10/09/2021  ? meloxicam (MOBIC) 7.5 MG tablet TAKE ONE TABLET BY MOUTH EVERY MORNING AND AT BEDTIME WITH FOOD (Patient taking differently: Take 7.5 mg by mouth 2 (two) times daily.) 180 tablet 0 10/08/2021  ? OVER THE COUNTER MEDICATION Apply 1 application.  topically daily as needed (For back pain). Medication: Magnilife Cream   10/08/2021  ? azithromycin (ZITHROMAX) 250 MG tablet Take 2 tablets on day 1, then 1 tablet daily on days 2 through 5 (Patient not taking: Reported on 10/10/2021) 6 tablet 0 Not Taking  ? sildenafil (VIAGRA) 50 MG tablet Take 1 tablet (50 mg total) by mouth daily as needed for erectile dysfunction. (Patient not taking: Reported on 10/10/2021) 10 tablet 0 Not Taking  ? tadalafil (CIALIS) 10 MG tablet Take 1 tablet (10 mg total) by mouth every other day as needed for erectile dysfunction. (Patient not taking: Reported on 10/10/2021) 10 tablet 1 Not Taking  ? ?Scheduled:  ? aspirin EC  81 mg Oral Daily  ? atorvastatin  80 mg Oral Daily  ? Chlorhexidine Gluconate Cloth  6 each Topical Daily  ? dapagliflozin propanediol  10 mg Oral Daily  ? digoxin  0.125 mg Oral Daily  ? feeding supplement  237 mL Oral BID BM  ? fluticasone furoate-vilanterol  1 puff Inhalation Daily  ? And  ? umeclidinium bromide  1 puff Inhalation Daily  ? gabapentin  300 mg Oral QHS  ? latanoprost  1  drop Both Eyes QHS  ? sodium chloride flush  10-40 mL Intracatheter Q12H  ? sodium chloride flush  3 mL Intravenous Q12H  ? spironolactone  12.5 mg Oral Daily  ? ?Infusions:  ? sodium chloride    ? heparin 2,250 Units/hr (10/12/21 1922)  ? ? ?Assessment: ?Pt presented with CP and HF. He is s/p cath with LAD disease. Plan to PCI at a later date. Start heparin for anticoagulation. No anticoagulation prior to admission.  ? ?Heparin level slightly subtherapeutic at 0.25. ? ?Goal of Therapy:  ?Heparin level 0.3-0.7 units/ml ?Monitor platelets by anticoagulation protocol: Yes ?  ?Plan:  ?Increase heparin to 2400 units/h ?Recheck heparin level with am labs ? ?Fredonia Highland, PharmD, BCPS, BCCP ?Clinical Pharmacist ?[phone] ?Please check AMION for all [facility] Pharmacy numbers ?10/12/2021 ? ? ? ?

## 2021-10-12 NOTE — Progress Notes (Signed)
ANTICOAGULATION CONSULT NOTE - Follow Up Consult ? ?Pharmacy Consult for heparin ?Indication: chest pain/ACS ? ?No Known Allergies ? ?Patient Measurements: ?Height: 6\' 1"  (185.4 cm) ?Weight: 98.8 kg (217 lb 14.4 oz) ?IBW/kg (Calculated) : 79.9 ?Heparin Dosing Weight: 100kg ? ?Vital Signs: ?Temp: 99 ?F (37.2 ?C) (05/06 1156) ?Temp Source: Oral (05/06 1156) ?BP: 103/80 (05/06 1156) ?Pulse Rate: 96 (05/06 1156) ? ?Labs: ?Recent Labs  ?  10/10/21 ?1606 10/11/21 ?0143 10/11/21 ?0143 10/11/21 ?[id number] 10/12/21 ?0419 10/12/21 ?0422 10/12/21 ?1201  ?HGB  --  12.2*  --   --   --  13.0  --   ?HCT  --  34.4*  --   --   --  36.2*  --   ?PLT  --  234  --   --   --  274  --   ?HEPARINUNFRC  --  <0.10*   < > 0.10* 0.16*  --  0.31  ?CREATININE 1.21 1.29*  --   --   --   --  1.13  ? < > = values in this interval not displayed.  ? ? ? ?Estimated Creatinine Clearance: 71 mL/min (by C-G formula based on SCr of 1.13 mg/dL). ? ? ?Medical History: ?Past Medical History:  ?Diagnosis Date  ? Arthritis   ? Glaucoma   ? ? ?Medications:  ?Medications Prior to Admission  ?Medication Sig Dispense Refill Last Dose  ? albuterol (VENTOLIN HFA) 108 (90 Base) MCG/ACT inhaler Inhale 2 puffs into the lungs every 6 (six) hours as needed for wheezing or shortness of breath. 8 g 0 Past Week  ? esomeprazole (NEXIUM) 40 MG capsule Take 40 mg by mouth daily at 12 noon.   10/08/2021  ? Fluticasone-Umeclidin-Vilant (TRELEGY ELLIPTA) 100-62.5-25 MCG/ACT AEPB Inhale 1 puff into the lungs daily. 2 each 0 Past Week  ? gabapentin (NEURONTIN) 300 MG capsule Take 1 capsule (300 mg total) by mouth at bedtime. 90 capsule 0 10/08/2021  ? latanoprost (XALATAN) 0.005 % ophthalmic solution Place 1 drop into both eyes at bedtime.   10/09/2021  ? meloxicam (MOBIC) 7.5 MG tablet TAKE ONE TABLET BY MOUTH EVERY MORNING AND AT BEDTIME WITH FOOD (Patient taking differently: Take 7.5 mg by mouth 2 (two) times daily.) 180 tablet 0 10/08/2021  ? OVER THE COUNTER MEDICATION Apply 1 application.  topically daily as needed (For back pain). Medication: Magnilife Cream   10/08/2021  ? azithromycin (ZITHROMAX) 250 MG tablet Take 2 tablets on day 1, then 1 tablet daily on days 2 through 5 (Patient not taking: Reported on 10/10/2021) 6 tablet 0 Not Taking  ? sildenafil (VIAGRA) 50 MG tablet Take 1 tablet (50 mg total) by mouth daily as needed for erectile dysfunction. (Patient not taking: Reported on 10/10/2021) 10 tablet 0 Not Taking  ? tadalafil (CIALIS) 10 MG tablet Take 1 tablet (10 mg total) by mouth every other day as needed for erectile dysfunction. (Patient not taking: Reported on 10/10/2021) 10 tablet 1 Not Taking  ? ?Scheduled:  ? aspirin EC  81 mg Oral Daily  ? atorvastatin  80 mg Oral Daily  ? Chlorhexidine Gluconate Cloth  6 each Topical Daily  ? dapagliflozin propanediol  10 mg Oral Daily  ? digoxin  0.125 mg Oral Daily  ? feeding supplement  237 mL Oral BID BM  ? fluticasone furoate-vilanterol  1 puff Inhalation Daily  ? And  ? umeclidinium bromide  1 puff Inhalation Daily  ? gabapentin  300 mg Oral QHS  ? latanoprost  1  drop Both Eyes QHS  ? sodium chloride flush  10-40 mL Intracatheter Q12H  ? sodium chloride flush  3 mL Intravenous Q12H  ? spironolactone  12.5 mg Oral Daily  ? ?Infusions:  ? sodium chloride    ? heparin 2,250 Units/hr (10/12/21 0720)  ? ? ?Assessment: ?Pt presented with CP and HF. He is s/p cath with LAD disease. Plan to PCI at a later date. Start heparin for anticoagulation. No anticoagulation prior to admission.  ? ?Heparin level is at the lower end of therapeutic at 0.31 on 2250 units/hr. CBC remains stable with Hgb at 13 and PLT at 274. No s/sx of bleeding noted.  ? ?Goal of Therapy:  ?Heparin level 0.3-0.7 units/ml ?Monitor platelets by anticoagulation protocol: Yes ?  ?Plan:  ?Continue heparin at 2250 units/hr ?F/up 8h HL ?Daily heparin level and CBC ? ?Joseph Art, Pharm.D. ?PGY-1 Pharmacy Resident ?[id number] ?10/12/2021 12:33 PM ? ? ?

## 2021-10-12 NOTE — Progress Notes (Signed)
? ? ?Advanced Heart Failure Rounding Note ? ? ?Subjective:   ? ?Diuresed well overnight. Weight down 9 pounds.  ? ?PICC placed last night. Co-ox 60% CVP 9-10 ? ?Denies CP. Breathing much better. No orthopnea or PND.  ? ? ?Objective:   ?Weight Range: ? ?Vital Signs:   ?Temp:  [97.7 ?F (36.5 ?C)-98.4 ?F (36.9 ?C)] 98 ?F (36.7 ?C) (05/06 5916) ?Pulse Rate:  [84-97] 84 (05/06 0902) ?Resp:  [19-23] 23 (05/06 3846) ?BP: (94-109)/(69-75) 96/71 (05/06 6599) ?SpO2:  [94 %-98 %] 94 % (05/06 0815) ?Weight:  [98.8 kg] 98.8 kg (05/06 0500) ?Last BM Date : 10/11/21 ? ?Weight change: ?Filed Weights  ? 10/10/21 2145 10/12/21 0500  ?Weight: 102.5 kg 98.8 kg  ? ? ?Intake/Output:  ? ?Intake/Output Summary (Last 24 hours) at 10/12/2021 0959 ?Last data filed at 10/12/2021 0300 ?Gross per 24 hour  ?Intake --  ?Output 1750 ml  ?Net -1750 ml  ?  ? ?Physical Exam: ?General:  Sitting up in bed. No resp difficulty ?HEENT: normal ?Neck: supple. JVP 9-10. Carotids 2+ bilat; no bruits. No lymphadenopathy or thryomegaly appreciated. ?Cor: PMI nondisplaced. Regular rate & rhythm. No rubs, gallops or murmurs. ?Lungs: clear ?Abdomen: obese soft, nontender, nondistended. No hepatosplenomegaly. No bruits or masses. Good bowel sounds. ?Extremities: no cyanosis, clubbing, rash, edema ?Neuro: alert & orientedx3, cranial nerves grossly intact. moves all 4 extremities w/o difficulty. Affect pleasant ? ?Telemetry: Sinus 80-90 Personally reviewed ? ? ?Labs: ?Basic Metabolic Panel: ?Recent Labs  ?Lab 10/10/21 ?1606 10/11/21 ?0143  ?NA 134* 135  ?K 3.7 3.7  ?CL 102 105  ?CO2 23 21*  ?GLUCOSE 127* 117*  ?BUN 38* 41*  ?CREATININE 1.21 1.29*  ?CALCIUM 8.3* 8.1*  ? ? ?Liver Function Tests: ?Recent Labs  ?Lab 10/10/21 ?1606  ?AST 98*  ?ALT 47*  ?ALKPHOS 51  ?BILITOT 1.0  ?PROT 6.6  ?ALBUMIN 2.8*  ? ?No results for input(s): LIPASE, AMYLASE in the last 168 hours. ?No results for input(s): AMMONIA in the last 168 hours. ? ?CBC: ?Recent Labs  ?Lab 10/11/21 ?0143  10/12/21 ?0422  ?WBC 10.2 10.6*  ?HGB 12.2* 13.0  ?HCT 34.4* 36.2*  ?MCV 86.0 85.8  ?PLT 234 274  ? ? ?Cardiac Enzymes: ?No results for input(s): CKTOTAL, CKMB, CKMBINDEX, TROPONINI in the last 168 hours. ? ?BNP: ?BNP (last 3 results) ?Recent Labs  ?  10/10/21 ?1606  ?BNP 1,564.6*  ? ? ?ProBNP (last 3 results) ?No results for input(s): PROBNP in the last 8760 hours. ? ? ? ?Other results: ? ?Imaging: ?CARDIAC CATHETERIZATION ? ?Result Date: 10/10/2021 ?  Mid RCA lesion is 100% stenosed.  Faint left to right collaterals.   Mid LAD lesion is 90% stenosed, at the origin of a large septal.   Ost LAD to Prox LAD lesion is 40% stenosed.   There is severe left ventricular systolic dysfunction.   LV end diastolic pressure is moderately elevated.   The left ventricular ejection fraction is less than 25% by visual estimate.   There is no aortic valve stenosis. RCA occlusion was likely the culprit.  I suspect this occurred on Fri/Sat of last week.  He has been pain free for several days.  Completed inferior infarct.  BP dropped with sedation and improved quickly post procedure He has residual LAD disease.  BP was low and LVEDP was high.  Plan to diurese and initiate CHF meds. Plan for LAD intervention at a later time.  Will have to load with antiplatelet therapy.  Plan for  LAD PCI at a later time.  ? ?DG CHEST PORT 1 VIEW ? ?Result Date: 10/10/2021 ?CLINICAL DATA:  NSTEMI EXAM: PORTABLE CHEST 1 VIEW COMPARISON:  Portable exam 1536 hours compared to 10/09/2021 FINDINGS: Upper normal heart size. Mediastinal contours and pulmonary vascularity normal. Atherosclerotic calcification aorta. Question atelectasis versus infiltrate RIGHT lower lobe medially. Remaining lungs clear. No pleural effusion or pneumothorax. No acute osseous findings. IMPRESSION: Question atelectasis versus infiltrate in medial RIGHT lower lobe. Electronically Signed   By: Ulyses Southward M.D.   On: 10/10/2021 15:48  ? ?ECHOCARDIOGRAM COMPLETE ? ?Result Date: 10/10/2021 ?    ECHOCARDIOGRAM REPORT   Patient Name:   Keith Jenkins Date of Exam: 10/10/2021 Medical Rec #:  [id number]    Height:       73.0 in Accession #:    [id number]   Weight:       226.0 lb Date of Birth:  [birth year]    BSA:          2.266 m? Patient Age:    76 years     BP:           101/77 mmHg Patient Gender: M            HR:           88 bpm. Exam Location:  Inpatient Procedure: 2D Echo, Cardiac Doppler, Color Doppler and 3D Echo                           STAT ECHO      Reported to: Dr Carolan Clines on 10/10/2021 3:52:00 PM Dr. Guinevere Scarlet reviewed echocardiogram after it was complete. Indications:    NSTEMI  History:        Patient has no prior history of Echocardiogram examinations.                 Acute MI; Risk Factors:Former Smoker.  Sonographer:    Ross Ludwig RDCS (AE) Referring Phys: [id number] Jonita Albee IMPRESSIONS  1. Akinesis of the inferior/ inferoseptal/ inferolateral wall, hypokinesis of the anterolateral wall basal-mid segments. Left ventricular ejection fraction, by estimation, is 30 to 35%. The left ventricle has moderately decreased function. The left ventricular internal cavity size was mildly to moderately dilated. There is mild left ventricular hypertrophy. Left ventricular diastolic parameters are indeterminate.  2. Right ventricular systolic function is moderately reduced. The right ventricular size is normal.  3. Left atrial size was moderately dilated.  4. Ischemic MR. The mitral valve is grossly normal. Mild to moderate mitral valve regurgitation.  5. The aortic valve is calcified. There is moderate calcification of the aortic valve. Aortic valve regurgitation is mild. Aortic valve sclerosis/calcification is present, without any evidence of aortic stenosis.  6. Aortic small aortic root aneurysm 4.3 cm.  7. The inferior vena cava is dilated in size with >50% respiratory variability, suggesting right atrial pressure of 8 mmHg. Comparison(s): No prior Echocardiogram. FINDINGS  Left Ventricle:  Akinesis of the inferior/ inferoseptal/ inferolateral wall, hypokinesis of the anterolateral wall basal-mid segments. Left ventricular ejection fraction, by estimation, is 30 to 35%. The left ventricle has moderately decreased function. The left ventricular internal cavity size was mildly to moderately dilated. There is mild left ventricular hypertrophy. Left ventricular diastolic parameters are indeterminate. Right Ventricle: The right ventricular size is normal. No increase in right ventricular wall thickness. Right ventricular systolic function is moderately reduced. Left Atrium: Left atrial size was moderately dilated. Right Atrium: Right  atrial size was normal in size. Pericardium: There is no evidence of pericardial effusion. Mitral Valve: Ischemic MR. The mitral valve is grossly normal. Mild to moderate mitral valve regurgitation. Tricuspid Valve: The tricuspid valve is normal in structure. Tricuspid valve regurgitation is mild. Aortic Valve: The aortic valve is calcified. There is moderate calcification of the aortic valve. Aortic valve regurgitation is mild. Aortic regurgitation PHT measures 434 msec. Aortic valve sclerosis/calcification is present, without any evidence of aortic stenosis. Aortic valve mean gradient measures 6.0 mmHg. Aortic valve peak gradient measures 11.0 mmHg. Aortic valve area, by VTI measures 1.50 cm?. Pulmonic Valve: The pulmonic valve was grossly normal. Pulmonic valve regurgitation is mild. Aorta: Small aortic root aneurysm 4.3 cm. Venous: The inferior vena cava is dilated in size with greater than 50% respiratory variability, suggesting right atrial pressure of 8 mmHg. IAS/Shunts: No atrial level shunt detected by color flow Doppler.  LEFT VENTRICLE PLAX 2D LVIDd:         6.00 cm      Diastology LVIDs:         4.80 cm      LV e' medial:    2.31 cm/s LV PW:         1.50 cm      LV E/e' medial:  45.5 LV IVS:        1.20 cm      LV e' lateral:   3.12 cm/s LVOT diam:     2.20 cm       LV E/e' lateral: 33.7 LV SV:         43 LV SV Index:   19 LVOT Area:     3.80 cm?                              3D Volume EF: LV Volumes (MOD)            3D EF:        25 % LV vol d, MOD A2C: 144.0 ml LV EDV:

## 2021-10-12 NOTE — Progress Notes (Signed)
ANTICOAGULATION CONSULT NOTE ?Pharmacy Consult for heparin ?Indication: chest pain/ACS ?Brief A/P: Heparin level subtherapeutic Increase Heparin rate ? ?No Known Allergies ? ?Patient Measurements: ?Height: 6\' 1"  (185.4 cm) ?Weight: 102.5 kg (226 lb) ?IBW/kg (Calculated) : 79.9 ?Heparin Dosing Weight: 100kg ? ?Vital Signs: ?Temp: 98.1 ?F (36.7 ?C) (05/06 0300) ?Temp Source: Oral (05/06 0300) ?BP: 109/75 (05/06 0300) ?Pulse Rate: 87 (05/06 0300) ? ?Labs: ?Recent Labs  ?  10/10/21 ?1606 10/11/21 ?0143 10/11/21 ?12/11/21 10/12/21 ?0419 10/12/21 ?0422  ?HGB  --  12.2*  --   --  13.0  ?HCT  --  34.4*  --   --  36.2*  ?PLT  --  234  --   --  274  ?HEPARINUNFRC  --  <0.10* 0.10* 0.16*  --   ?CREATININE 1.21 1.29*  --   --   --   ? ? ? ?Estimated Creatinine Clearance: 63.2 mL/min (A) (by C-G formula based on SCr of 1.29 mg/dL (H)). ? ?Assessment: ?76 y.o. male admitted with STEMI s/p cath, awaiting PCI, for heparin ? ?Goal of Therapy:  ?Heparin level 0.3-0.7 units/ml ?Monitor platelets by anticoagulation protocol: Yes ?  ?Plan:  ?Increase Heparin 2250 units/hr ?Check heparin level in 8 hours. ? ?68, PharmD, BCPS ?10/12/2021 5:27 AM ? ? ? ? ?

## 2021-10-12 NOTE — Progress Notes (Signed)
Pt's daughter advised that pink eye is currently being spread in the family. Family has been advised to limit physical contact with patient to prevent infection.  ?

## 2021-10-12 NOTE — Progress Notes (Signed)
CARDIAC REHAB PHASE I  ? ?Offered to walk pt. Pt refused walking. Answered question that pt and family had. Pt in bed awaiting doctor with family in room. Will continue to follow.  ? ?[id number] ?Joya San, MS, ACSM-CEP ?10/12/2021 ?11:07 AM ? ?   ?

## 2021-10-13 DIAGNOSIS — I340 Nonrheumatic mitral (valve) insufficiency: Secondary | ICD-10-CM

## 2021-10-13 DIAGNOSIS — I5021 Acute systolic (congestive) heart failure: Secondary | ICD-10-CM | POA: Diagnosis not present

## 2021-10-13 LAB — BASIC METABOLIC PANEL
Anion gap: 9 (ref 5–15)
BUN: 32 mg/dL — ABNORMAL HIGH (ref 8–23)
CO2: 20 mmol/L — ABNORMAL LOW (ref 22–32)
Calcium: 8.4 mg/dL — ABNORMAL LOW (ref 8.9–10.3)
Chloride: 107 mmol/L (ref 98–111)
Creatinine, Ser: 1.08 mg/dL (ref 0.61–1.24)
GFR, Estimated: >60 mL/min (ref >60)
Glucose, Bld: 123 mg/dL — ABNORMAL HIGH (ref 70–99)
Potassium: 4.1 mmol/L (ref 3.5–5.1)
Sodium: 136 mmol/L (ref 135–145)

## 2021-10-13 LAB — COOXEMETRY PANEL
Carboxyhemoglobin: 1.2 % (ref 0.5–1.5)
Methemoglobin: <0.7 % (ref 0.0–1.5)
O2 Saturation: 59.5 %
Total hemoglobin: 13.3 g/dL (ref 12.0–16.0)

## 2021-10-13 LAB — CREATININE, SERUM
Creatinine, Ser: 1.14 mg/dL (ref 0.61–1.24)
GFR, Estimated: >60 mL/min (ref >60)

## 2021-10-13 LAB — CBC
HCT: 36.1 % — ABNORMAL LOW (ref 39.0–52.0)
HCT: 35.7 % — ABNORMAL LOW (ref 39.0–52.0)
Hemoglobin: 12.6 g/dL — ABNORMAL LOW (ref 13.0–17.0)
Hemoglobin: 12.2 g/dL — ABNORMAL LOW (ref 13.0–17.0)
MCH: 30.2 pg (ref 26.0–34.0)
MCH: 29.9 pg (ref 26.0–34.0)
MCHC: 34.9 g/dL (ref 30.0–36.0)
MCHC: 34.2 g/dL (ref 30.0–36.0)
MCV: 86.6 fL (ref 80.0–100.0)
MCV: 87.5 fL (ref 80.0–100.0)
Platelets: 298 10*3/uL (ref 150–400)
Platelets: 286 10*3/uL (ref 150–400)
RBC: 4.17 MIL/uL — ABNORMAL LOW (ref 4.22–5.81)
RBC: 4.08 MIL/uL — ABNORMAL LOW (ref 4.22–5.81)
RDW: 13.9 % (ref 11.5–15.5)
RDW: 14.1 % (ref 11.5–15.5)
WBC: 11.4 10*3/uL — ABNORMAL HIGH (ref 4.0–10.5)
WBC: 11.8 10*3/uL — ABNORMAL HIGH (ref 4.0–10.5)
nRBC: 0 % (ref 0.0–0.2)
nRBC: 0 % (ref 0.0–0.2)

## 2021-10-13 LAB — HEPARIN LEVEL (UNFRACTIONATED): Heparin Unfractionated: 0.26 IU/mL — ABNORMAL LOW (ref 0.30–0.70)

## 2021-10-13 MED ORDER — FUROSEMIDE 10 MG/ML IJ SOLN
40.0000 mg | Freq: Once | INTRAMUSCULAR | Status: AC
  Administered 2021-10-13: 40 mg via INTRAVENOUS
  Filled 2021-10-13: qty 4

## 2021-10-13 MED ORDER — GUAIFENESIN-DM 100-10 MG/5ML PO SYRP
5.0000 mL | ORAL_SOLUTION | ORAL | Status: DC | PRN
  Administered 2021-10-13 – 2021-10-16 (×6): 5 mL via ORAL
  Filled 2021-10-13 (×7): qty 5

## 2021-10-13 MED ORDER — PANTOPRAZOLE SODIUM 40 MG PO TBEC
40.0000 mg | DELAYED_RELEASE_TABLET | Freq: Every day | ORAL | Status: DC
  Administered 2021-10-13 – 2021-10-16 (×4): 40 mg via ORAL
  Filled 2021-10-13 (×4): qty 1

## 2021-10-13 MED ORDER — ENOXAPARIN SODIUM 40 MG/0.4ML IJ SOSY
40.0000 mg | PREFILLED_SYRINGE | INTRAMUSCULAR | Status: DC
  Administered 2021-10-13 – 2021-10-15 (×3): 40 mg via SUBCUTANEOUS
  Filled 2021-10-13 (×3): qty 0.4

## 2021-10-13 NOTE — Progress Notes (Signed)
ANTICOAGULATION CONSULT NOTE - Follow Up Consult ? ?Pharmacy Consult for heparin ?Indication: chest pain/ACS ? ?No Known Allergies ? ?Patient Measurements: ?Height: 6\' 1"  (185.4 cm) ?Weight: 98.7 kg (217 lb 9.6 oz) ?IBW/kg (Calculated) : 79.9 ?Heparin Dosing Weight: 100kg ? ?Vital Signs: ?Temp: 98.1 ?F (36.7 ?C) (05/07 04-30-1986) ?Temp Source: Oral (05/07 04-30-1986) ?BP: 91/64 (05/07 0048) ?Pulse Rate: 84 (05/07 0048) ? ?Labs: ?Recent Labs  ?  10/11/21 ?0143 10/11/21 ?12/11/21 10/12/21 ?0422 10/12/21 ?1201 10/12/21 ?2044 10/13/21 ?0054  ?HGB 12.2*  --  13.0  --   --  12.6*  ?HCT 34.4*  --  36.2*  --   --  36.1*  ?PLT 234  --  274  --   --  298  ?HEPARINUNFRC <0.10*   < >  --  0.31 0.25* 0.26*  ?CREATININE 1.29*  --   --  1.13  --  1.08  ? < > = values in this interval not displayed.  ? ? ? ?Estimated Creatinine Clearance: 74.2 mL/min (by C-G formula based on SCr of 1.08 mg/dL). ? ? ?Medical History: ?Past Medical History:  ?Diagnosis Date  ? Arthritis   ? Glaucoma   ? ? ?Medications:  ?Medications Prior to Admission  ?Medication Sig Dispense Refill Last Dose  ? albuterol (VENTOLIN HFA) 108 (90 Base) MCG/ACT inhaler Inhale 2 puffs into the lungs every 6 (six) hours as needed for wheezing or shortness of breath. 8 g 0 Past Week  ? esomeprazole (NEXIUM) 40 MG capsule Take 40 mg by mouth daily at 12 noon.   10/08/2021  ? Fluticasone-Umeclidin-Vilant (TRELEGY ELLIPTA) 100-62.5-25 MCG/ACT AEPB Inhale 1 puff into the lungs daily. 2 each 0 Past Week  ? gabapentin (NEURONTIN) 300 MG capsule Take 1 capsule (300 mg total) by mouth at bedtime. 90 capsule 0 10/08/2021  ? latanoprost (XALATAN) 0.005 % ophthalmic solution Place 1 drop into both eyes at bedtime.   10/09/2021  ? meloxicam (MOBIC) 7.5 MG tablet TAKE ONE TABLET BY MOUTH EVERY MORNING AND AT BEDTIME WITH FOOD (Patient taking differently: Take 7.5 mg by mouth 2 (two) times daily.) 180 tablet 0 10/08/2021  ? OVER THE COUNTER MEDICATION Apply 1 application. topically daily as needed (For back  pain). Medication: Magnilife Cream   10/08/2021  ? azithromycin (ZITHROMAX) 250 MG tablet Take 2 tablets on day 1, then 1 tablet daily on days 2 through 5 (Patient not taking: Reported on 10/10/2021) 6 tablet 0 Not Taking  ? sildenafil (VIAGRA) 50 MG tablet Take 1 tablet (50 mg total) by mouth daily as needed for erectile dysfunction. (Patient not taking: Reported on 10/10/2021) 10 tablet 0 Not Taking  ? tadalafil (CIALIS) 10 MG tablet Take 1 tablet (10 mg total) by mouth every other day as needed for erectile dysfunction. (Patient not taking: Reported on 10/10/2021) 10 tablet 1 Not Taking  ? ?Scheduled:  ? aspirin EC  81 mg Oral Daily  ? atorvastatin  80 mg Oral Daily  ? Chlorhexidine Gluconate Cloth  6 each Topical Daily  ? dapagliflozin propanediol  10 mg Oral Daily  ? digoxin  0.125 mg Oral Daily  ? feeding supplement  237 mL Oral BID BM  ? fluticasone furoate-vilanterol  1 puff Inhalation Daily  ? And  ? umeclidinium bromide  1 puff Inhalation Daily  ? gabapentin  300 mg Oral QHS  ? latanoprost  1 drop Both Eyes QHS  ? sodium chloride flush  10-40 mL Intracatheter Q12H  ? sodium chloride flush  3 mL Intravenous  Q12H  ? spironolactone  12.5 mg Oral Daily  ? ?Infusions:  ? sodium chloride    ? heparin 2,400 Units/hr (10/13/21 0528)  ? ? ?Assessment: ?Pt presented with CP and HF. He is s/p cath with LAD disease. Plan to PCI at a later date. Start heparin for anticoagulation. No anticoagulation prior to admission.  ? ?Heparin level slightly subtherapeutic at 0.26 on 2400 units/hr. CBC remains stable with Hgb at 12.6 and PLT at 298. No s/sx of bleeding noted.  ? ?Goal of Therapy:  ?Heparin level 0.3-0.7 units/ml ?Monitor platelets by anticoagulation protocol: Yes ?  ?Plan:  ?Increase heparin to 2600 units/h ?F/up 8h heparin level ?Recheck heparin level with am labs ? ?Valeda Malm, Pharm.D. ?PGY-1 Pharmacy Resident ?[phone] ?10/13/2021 7:26 AM ? ?

## 2021-10-13 NOTE — Progress Notes (Signed)
? ? ?Advanced Heart Failure Rounding Note ? ? ?Subjective:   ? ?Denies CP, SOB, orthopnea. Nurse reports he is weak and unsteady on his feet ? ?CVP 10. Co-ox 60% ? ? ?Objective:   ?Weight Range: ? ?Vital Signs:   ?Temp:  [97.8 ?F (36.6 ?C)-99 ?F (37.2 ?C)] 98.1 ?F (36.7 ?C) (05/07 5284) ?Pulse Rate:  [84-96] 95 (05/07 0828) ?Resp:  [17-22] 20 (05/07 1324) ?BP: (91-117)/(64-82) 115/80 (05/07 4010) ?SpO2:  [94 %-99 %] 98 % (05/07 0828) ?Weight:  [98.7 kg] 98.7 kg (05/07 0338) ?Last BM Date : 10/11/21 ? ?Weight change: ?Filed Weights  ? 10/10/21 2145 10/12/21 0500 10/13/21 0338  ?Weight: 102.5 kg 98.8 kg 98.7 kg  ? ? ?Intake/Output:  ? ?Intake/Output Summary (Last 24 hours) at 10/13/2021 1043 ?Last data filed at 10/12/2021 2000 ?Gross per 24 hour  ?Intake 752.82 ml  ?Output 775 ml  ?Net -22.18 ml  ? ?  ? ?Physical Exam: ?General:  Sitting up in bed. No resp difficulty ?HEENT: normal ?Neck: supple JVP to jaw  Carotids 2+ bilat; no bruits. No lymphadenopathy or thryomegaly appreciated. ?Cor: PMI nondisplaced. Regular rate & rhythm. I don't hear MR ?Lungs: clear ?Abdomen: soft, nontender, nondistended. No hepatosplenomegaly. No bruits or masses. Good bowel sounds. ?Extremities: no cyanosis, clubbing, rash, edema ?Neuro: alert & orientedx3, cranial nerves grossly intact. moves all 4 extremities w/o difficulty. Affect pleasant ? ? ?Telemetry: Sinus 90s Personally reviewed ? ? ?Labs: ?Basic Metabolic Panel: ?Recent Labs  ?Lab 10/10/21 ?1606 10/11/21 ?0143 10/12/21 ?1201 10/13/21 ?0054  ?NA 134* 135 135 136  ?K 3.7 3.7 4.1 4.1  ?CL 102 105 106 107  ?CO2 23 21* 21* 20*  ?GLUCOSE 127* 117* 144* 123*  ?BUN 38* 41* 34* 32*  ?CREATININE 1.21 1.29* 1.13 1.08  ?CALCIUM 8.3* 8.1* 8.4* 8.4*  ? ? ? ?Liver Function Tests: ?Recent Labs  ?Lab 10/10/21 ?1606  ?AST 98*  ?ALT 47*  ?ALKPHOS 51  ?BILITOT 1.0  ?PROT 6.6  ?ALBUMIN 2.8*  ? ? ?No results for input(s): LIPASE, AMYLASE in the last 168 hours. ?No results for input(s): AMMONIA in the  last 168 hours. ? ?CBC: ?Recent Labs  ?Lab 10/11/21 ?0143 10/12/21 ?0422 10/13/21 ?0054  ?WBC 10.2 10.6* 11.4*  ?HGB 12.2* 13.0 12.6*  ?HCT 34.4* 36.2* 36.1*  ?MCV 86.0 85.8 86.6  ?PLT 234 274 298  ? ? ? ?Cardiac Enzymes: ?No results for input(s): CKTOTAL, CKMB, CKMBINDEX, TROPONINI in the last 168 hours. ? ?BNP: ?BNP (last 3 results) ?Recent Labs  ?  10/10/21 ?1606  ?BNP 1,564.6*  ? ? ? ?ProBNP (last 3 results) ?No results for input(s): PROBNP in the last 8760 hours. ? ? ? ?Other results: ? ?Imaging: ?[place] EKG SITE RITE ? ?Result Date: 10/11/2021 ?If [employer] not attached, placement could not be confirmed due to current cardiac rhythm.  ? ? ?Medications:   ? ? ?Scheduled Medications: ? aspirin EC  81 mg Oral Daily  ? atorvastatin  80 mg Oral Daily  ? Chlorhexidine Gluconate Cloth  6 each Topical Daily  ? dapagliflozin propanediol  10 mg Oral Daily  ? digoxin  0.125 mg Oral Daily  ? feeding supplement  237 mL Oral BID BM  ? fluticasone furoate-vilanterol  1 puff Inhalation Daily  ? And  ? umeclidinium bromide  1 puff Inhalation Daily  ? gabapentin  300 mg Oral QHS  ? latanoprost  1 drop Both Eyes QHS  ? sodium chloride flush  10-40 mL Intracatheter Q12H  ?  sodium chloride flush  3 mL Intravenous Q12H  ? spironolactone  12.5 mg Oral Daily  ? ? ?Infusions: ? sodium chloride    ? heparin 2,600 Units/hr (10/13/21 4098)  ? ? ?PRN Medications: ?sodium chloride, acetaminophen, albuterol, melatonin, ondansetron (ZOFRAN) IV, sodium chloride flush, sodium chloride flush ? ? ?Assessment/Plan:  ? ?1. CAD with late presenting Inferior STEMI  ?-LHC 05/04: 100% m RCA and 90% m LAD.  ?-RCA managed medically d/t completed infarct.  ?-Echo EF 25%, RV moderately reduced, moderate to severe MR ?- No s/s angina. Can stop heparin ?-Initial plan for PCI to LAD but with moderate to severe ischemic MR I think better option is CABG/MVR with LIMA to LAD. If not candidate can consider PCI LAD with mTEER ?- Discussed with Dr. Laneta Simmers. Agrees  with both options ?- Will plan TEE tomorrow to further evaluate his MV. It has been scheduled for 1p. Orders written  ?- He got 2 doses of Plavix (last dose 5/6). ?- Continue ASA/statin ?  ?2. New systolic CHF/ICM ?- In setting of late presentation inferior STEMI ?- Echo EF 25-30%, RV moderately reduced, moderate to severe MR ?-Symptomatically much improved with GDMT initiation and diuresis ?-Volume status going back up. Will give lasix 40 IV. Co-ox stable 60% ?-Continue digoxin 0.125 ?-Continue spiro 12.5 mg daily ?-Continue farxiga 10 mg daily ?- Add losartan 12.5 daily ? ?3. Mitral regurgitation ?-Moderate to severe on echo this admit ?-Likely ischemic ?-TCTS has seen ?- TEE tomorrow ?  ?4. Possible AKI ?-Scr baseline 0.8-0.9, this admit 1.21>1.29 ?-Scr today 1.08 ?  ?5. Prediabetes ?-A1c 5.8 01/23 ?-Farxiga as above ? ?6. Physical deconditioning ?- PT/OT consult ? ?Length of Stay: 3 ? ? ?Arvilla Meres MD ?10/13/2021, 10:43 AM ? ?Advanced Heart Failure Team ?Pager [phone] (M-F; 7a - 4p)  ?Please contact [facility] for night-coverage after hours (4p -7a ) and weekends on [email or web address] ? ?

## 2021-10-14 ENCOUNTER — Encounter (HOSPITAL_COMMUNITY): Payer: Self-pay | Admitting: Interventional Cardiology

## 2021-10-14 ENCOUNTER — Encounter (HOSPITAL_COMMUNITY)
Admission: AD | Disposition: A | Discharge status: Home / Self Care | Payer: Self-pay | Source: Other Acute Inpatient Hospital | Attending: Interventional Cardiology

## 2021-10-14 ENCOUNTER — Inpatient Hospital Stay (HOSPITAL_COMMUNITY): Payer: Medicare HMO | Admitting: Anesthesiology

## 2021-10-14 ENCOUNTER — Inpatient Hospital Stay (HOSPITAL_COMMUNITY): Payer: Medicare HMO

## 2021-10-14 DIAGNOSIS — I083 Combined rheumatic disorders of mitral, aortic and tricuspid valves: Secondary | ICD-10-CM

## 2021-10-14 DIAGNOSIS — I5021 Acute systolic (congestive) heart failure: Secondary | ICD-10-CM | POA: Diagnosis not present

## 2021-10-14 DIAGNOSIS — I340 Nonrheumatic mitral (valve) insufficiency: Secondary | ICD-10-CM | POA: Diagnosis not present

## 2021-10-14 DIAGNOSIS — I252 Old myocardial infarction: Secondary | ICD-10-CM

## 2021-10-14 DIAGNOSIS — D649 Anemia, unspecified: Secondary | ICD-10-CM

## 2021-10-14 DIAGNOSIS — M1990 Unspecified osteoarthritis, unspecified site: Secondary | ICD-10-CM

## 2021-10-14 HISTORY — PX: TEE WITHOUT CARDIOVERSION: SHX5443

## 2021-10-14 LAB — COOXEMETRY PANEL
Carboxyhemoglobin: 1.5 % (ref 0.5–1.5)
Methemoglobin: <0.7 % (ref 0.0–1.5)
O2 Saturation: 57.7 %
Total hemoglobin: 12.6 g/dL (ref 12.0–16.0)

## 2021-10-14 LAB — CBC
HCT: 33.5 % — ABNORMAL LOW (ref 39.0–52.0)
Hemoglobin: 11.9 g/dL — ABNORMAL LOW (ref 13.0–17.0)
MCH: 30.8 pg (ref 26.0–34.0)
MCHC: 35.5 g/dL (ref 30.0–36.0)
MCV: 86.8 fL (ref 80.0–100.0)
Platelets: 281 10*3/uL (ref 150–400)
RBC: 3.86 MIL/uL — ABNORMAL LOW (ref 4.22–5.81)
RDW: 14.1 % (ref 11.5–15.5)
WBC: 10.9 10*3/uL — ABNORMAL HIGH (ref 4.0–10.5)
nRBC: 0 % (ref 0.0–0.2)

## 2021-10-14 LAB — BASIC METABOLIC PANEL
Anion gap: 7 (ref 5–15)
BUN: 33 mg/dL — ABNORMAL HIGH (ref 8–23)
CO2: 24 mmol/L (ref 22–32)
Calcium: 8 mg/dL — ABNORMAL LOW (ref 8.9–10.3)
Chloride: 105 mmol/L (ref 98–111)
Creatinine, Ser: 1.09 mg/dL (ref 0.61–1.24)
GFR, Estimated: >60 mL/min (ref >60)
Glucose, Bld: 114 mg/dL — ABNORMAL HIGH (ref 70–99)
Potassium: 4.2 mmol/L (ref 3.5–5.1)
Sodium: 136 mmol/L (ref 135–145)

## 2021-10-14 SURGERY — ECHOCARDIOGRAM, TRANSESOPHAGEAL
Anesthesia: Monitor Anesthesia Care

## 2021-10-14 MED ORDER — PROPOFOL 500 MG/50ML IV EMUL
INTRAVENOUS | Status: DC | PRN
  Administered 2021-10-14: 100 ug/kg/min via INTRAVENOUS

## 2021-10-14 MED ORDER — PROPOFOL 10 MG/ML IV BOLUS
INTRAVENOUS | Status: DC | PRN
  Administered 2021-10-14: 20 mg via INTRAVENOUS

## 2021-10-14 MED ORDER — SODIUM CHLORIDE 0.9 % IV SOLN: INTRAVENOUS | Status: DC

## 2021-10-14 MED ORDER — FUROSEMIDE 10 MG/ML IJ SOLN
40.0000 mg | Freq: Once | INTRAMUSCULAR | Status: AC
Start: 2021-10-14 — End: 2021-10-14
  Administered 2021-10-14: 40 mg via INTRAVENOUS
  Filled 2021-10-14: qty 4

## 2021-10-14 MED ORDER — PHENYLEPHRINE 80 MCG/ML (10ML) SYRINGE FOR IV PUSH (FOR BLOOD PRESSURE SUPPORT)
PREFILLED_SYRINGE | INTRAVENOUS | Status: DC | PRN
  Administered 2021-10-14 (×2): 160 ug via INTRAVENOUS

## 2021-10-14 MED ORDER — POTASSIUM CHLORIDE CRYS ER 20 MEQ PO TBCR
40.0000 meq | EXTENDED_RELEASE_TABLET | Freq: Once | ORAL | Status: AC
  Administered 2021-10-14: 40 meq via ORAL
  Filled 2021-10-14: qty 2

## 2021-10-14 NOTE — CV Procedure (Signed)
? ? ?  TRANSESOPHAGEAL ECHOCARDIOGRAM  ? ?NAME:  Keith Jenkins   MRN: [id number] ?DOB:  1946-04-24   ADMIT DATE: 10/10/2021 ? ?INDICATIONS: ? ?Mitral regurgitation ? ?PROCEDURE:  ? ?Informed consent was obtained prior to the procedure. The risks, benefits and alternatives for the procedure were discussed and the patient comprehended these risks.  Risks include, but are not limited to, cough, sore throat, vomiting, nausea, somnolence, esophageal and stomach trauma or perforation, bleeding, low blood pressure, aspiration, pneumonia, infection, trauma to the teeth and death.   ? ?The patient was sedated by the anesthesia service.  The transesophageal probe was inserted in the esophagus and stomach without difficulty and multiple views were obtained.  ? ? ?COMPLICATIONS:   ? ?There were no immediate complications. ? ?FINDINGS: ? ?LEFT VENTRICLE: EF = 25-30%. Inferior posterior AK ? ?RIGHT VENTRICLE: Mild HK ? ?LEFT ATRIUM: Normal ? ?LEFT ATRIAL APPENDAGE: No thrombus.  ? ?RIGHT ATRIUM: Normal ? ?AORTIC VALVE:  Trileaflet. Calcified. Mild to moderate AI ? ?MITRAL VALVE:    Posterior leaflet restricted. Moderate 2-3+ MR with flow reversal in pulmoanry veins  ? ?TRICUSPID VALVE: Normal. Mild TR ? ?PULMONIC VALVE: Grossly normal. ? ?INTERATRIAL SEPTUM: No PFO or ASD. ? ?PERICARDIUM: Small effusion next RV ? ?DESCENDING AORTA: Mild to moderate plaque  ? ? ?Kamaree Wheatley,MD ?1:39 PM ?  ?

## 2021-10-14 NOTE — Care Management Important Message (Signed)
Important Message ? ?Patient Details  ?Name: Keith Jenkins ?MRN: [id number] ?Date of Birth: Dec 04, 1945 ? ? ?Medicare Important Message Given:  Yes ? ? ? ? ?[name] ?10/14/2021, 9:13 AM ?

## 2021-10-14 NOTE — Transfer of Care (Signed)
Immediate Anesthesia Transfer of Care Note ? ?Patient: Keith Jenkins ? ?Procedure(s) Performed: TRANSESOPHAGEAL ECHOCARDIOGRAM (TEE) ? ?Patient Location: Endoscopy Unit ? ?Anesthesia Type:MAC ? ?Level of Consciousness: drowsy and patient cooperative ? ?Airway & Oxygen Therapy: Patient Spontanous Breathing and Patient connected to nasal cannula oxygen ? ?Post-op Assessment: Report given to RN, Post -op Vital signs reviewed and stable and Patient moving all extremities ? ?Post vital signs: Reviewed and stable ? ?Last Vitals:  ?Vitals Value Taken Time  ?BP    ?Temp    ?Pulse    ?Resp    ?SpO2    ? ? ?Last Pain:  ?Vitals:  ? 10/14/21 1227  ?TempSrc: Temporal  ?PainSc: 0-No pain  ?   ? ?Patients Stated Pain Goal: 0 (10/14/21 0750) ? ?Complications: No notable events documented. ?

## 2021-10-14 NOTE — Interval H&P Note (Signed)
History and Physical Interval Note: ? ?10/14/2021 ?11:17 AM ? ?Keith Jenkins  has presented today for surgery, with the diagnosis of mitral regurgitation.  The various methods of treatment have been discussed with the patient and family. After consideration of risks, benefits and other options for treatment, the patient has consented to  Procedure(s): ?TRANSESOPHAGEAL ECHOCARDIOGRAM (TEE) (N/A) as a surgical intervention.  The patient's history has been reviewed, patient examined, no change in status, stable for surgery.  I have reviewed the patient's chart and labs.  Questions were answered to the patient's satisfaction.   ? ? ?Keith Jenkins ? ? ?

## 2021-10-14 NOTE — Anesthesia Preprocedure Evaluation (Signed)
Anesthesia Evaluation  ?Patient identified by MRN, date of birth, ID band ?Patient awake ? ? ? ?Reviewed: ?Allergy & Precautions, NPO status , Patient's Chart, lab work & pertinent test results ? ?Airway ?Mallampati: II ? ?TM Distance: >3 FB ?Neck ROM: Full ? ? ? Dental ?no notable dental hx. ? ?  ?Pulmonary ?neg pulmonary ROS, former smoker,  ?  ?Pulmonary exam normal ?breath sounds clear to auscultation ? ? ? ? ? ? Cardiovascular ?Exercise Tolerance: Poor ?+ Past MI  ?+ Valvular Problems/Murmurs MR  ?Rhythm:Regular Rate:Normal ? ?+murmur ?  ?Neuro/Psych ?negative neurological ROS ? negative psych ROS  ? GI/Hepatic ?negative GI ROS, Neg liver ROS,   ?Endo/Other  ?negative endocrine ROS ? Renal/GU ?negative Renal ROS  ?negative genitourinary ?  ?Musculoskeletal ? ?(+) Arthritis , Osteoarthritis,   ? Abdominal ?  ?Peds ?negative pediatric ROS ?(+)  Hematology ? ?(+) Blood dyscrasia, anemia ,   ?Anesthesia Other Findings ? ? Reproductive/Obstetrics ?negative OB ROS ? ?  ? ? ? ? ? ? ? ? ? ? ? ? ? ?  ?  ? ? ? ? ? ? ? ? ?Anesthesia Physical ?Anesthesia Plan ? ?ASA: 3 ? ?Anesthesia Plan: MAC  ? ?Post-op Pain Management:   ? ?Induction: Intravenous ? ?PONV Risk Score and Plan: 1 and Propofol infusion, TIVA, Treatment may vary due to age or medical condition and Ondansetron ? ?Airway Management Planned: Natural Airway and Simple Face Mask ? ?Additional Equipment: None ? ?Intra-op Plan:  ? ?Post-operative Plan:  ? ?Informed Consent: I have reviewed the patients History and Physical, chart, labs and discussed the procedure including the risks, benefits and alternatives for the proposed anesthesia with the patient or authorized representative who has indicated his/her understanding and acceptance.  ? ? ? ? ? ?Plan Discussed with: Anesthesiologist ? ?Anesthesia Plan Comments:   ? ? ? ? ? ? ?Anesthesia Quick Evaluation ? ?

## 2021-10-14 NOTE — Progress Notes (Signed)
Day of Surgery Procedure(s) (LRB): ?TRANSESOPHAGEAL ECHOCARDIOGRAM (TEE) (N/A) ?Subjective: ?No chest pain or shortness of breath. Ambulated down hall today. Had TEE today. ? ?Objective: ?Vital signs in last 24 hours: ?Temp:  [97.5 ?F (36.4 ?C)-99.8 ?F (37.7 ?C)] 97.6 ?F (36.4 ?C) (05/08 1443) ?Pulse Rate:  [78-96] 83 (05/08 1420) ?Cardiac Rhythm: Normal sinus rhythm (05/08 0750) ?Resp:  [15-22] 20 (05/08 1443) ?BP: (91-131)/(70-92) 109/84 (05/08 1443) ?SpO2:  [93 %-100 %] 100 % (05/08 1443) ?Weight:  [98.5 kg] 98.5 kg (05/08 0415) ? ?Hemodynamic parameters for last 24 hours: ?CVP:  [10 mmHg-18 mmHg] 11 mmHg ? ?Intake/Output from previous day: ?05/07 0701 - 05/08 0700 ?In: -  ?Out: 700 [Urine:700] ?Intake/Output this shift: ?Total I/O ?In: 460 [P.O.:240; I.V.:220] ?Out: 900 [Urine:900] ? ?General appearance: alert and cooperative ?Neurologic: intact ?Heart: regular rate and rhythm, S1, S2 normal, no murmur ?Lungs: clear to auscultation bilaterally ?Extremities: no edema ? ?Lab Results: ?Recent Labs  ?  10/13/21 ?1140 10/14/21 ?0750  ?WBC 11.8* 10.9*  ?HGB 12.2* 11.9*  ?HCT 35.7* 33.5*  ?PLT 286 281  ? ?BMET:  ?Recent Labs  ?  10/13/21 ?0054 10/13/21 ?1140 10/14/21 ?0750  ?NA 136  --  136  ?K 4.1  --  4.2  ?CL 107  --  105  ?CO2 20*  --  24  ?GLUCOSE 123*  --  114*  ?BUN 32*  --  33*  ?CREATININE 1.08 1.14 1.09  ?CALCIUM 8.4*  --  8.0*  ?  ?PT/INR: No results for input(s): LABPROT, INR in the last 72 hours. ?ABG ?   ?Component Value Date/Time  ? O2SAT 57.7 10/14/2021 0404  ? ?CBG (last 3)  ?No results for input(s): GLUCAP in the last 72 hours. ? ?Assessment/Plan: ? ?I reviewed the TEE images and discussed with Dr. Gala Romney. The EF is 25-30% with inferoposterior akinesis. There is moderate to severe ischemic MR from posterior leaflet restriction with pulm vein flow reversal. The aortic valve is thickened with some calcification and immobility of left coronary leaflet, mild to moderate AI. The mean gradient across the  aortic valve on 2D echo was only 6 mm Hg but the SVI was only 19. Dr. Excell Seltzer does not feel that LAD is ideal for PCI due to length of disease and large septal arising at the stenosed area. Therefore CABG and MV replacement is probably the best treatment option. Aortic valve can be followed and treated with TAVR later if it worsens.  Ideally would like to let him recover further before proceeding with surgery but will have to decide if he is stable enough to go home before surgery. ? ? LOS: 4 days  ? ? ?[name] ?10/14/2021 ? ? ?

## 2021-10-14 NOTE — Anesthesia Postprocedure Evaluation (Signed)
Anesthesia Post Note ? ?Patient: Keith Jenkins ? ?Procedure(s) Performed: TRANSESOPHAGEAL ECHOCARDIOGRAM (TEE) ? ?  ? ?Patient location during evaluation: PACU ?Anesthesia Type: MAC ?Level of consciousness: awake and alert ?Pain management: pain level controlled ?Vital Signs Assessment: post-procedure vital signs reviewed and stable ?Respiratory status: spontaneous breathing, nonlabored ventilation, respiratory function stable and patient connected to nasal cannula oxygen ?Cardiovascular status: stable and blood pressure returned to baseline ?Postop Assessment: no apparent nausea or vomiting ?Anesthetic complications: no ? ? ?No notable events documented. ? ?Last Vitals:  ?Vitals:  ? 10/14/21 1409 10/14/21 1420  ?BP: 110/78 113/77  ?Pulse: 78 83  ?Resp: 15 (!) 22  ?Temp:    ?SpO2: 98% 99%  ?  ?Last Pain:  ?Vitals:  ? 10/14/21 1420  ?TempSrc:   ?PainSc: 0-No pain  ? ? ?  ?  ?  ?  ?  ?  ? ?Effie Berkshire ? ? ? ? ?

## 2021-10-14 NOTE — Evaluation (Signed)
Physical Therapy Evaluation ?Patient Details ?Name: Keith Jenkins ?MRN: [id number] ?DOB: 1945/06/21 ?Today's Date: 10/14/2021 ? ?History of Present Illness ? 76 yo admitted 5/4 with SOB and chest pain with last presentation STEMI. Cardiac cath 5/4. PMhx: pre diabetes, HLD, GERD, arthritis, glaucoma  ?Clinical Impression ? Pt pleasant and reports having not been out of bed significantly since admission with LOB with standing, impaired balance, decreased gait speed and functional mobility. Pt will benefit from acute therapy to maximize mobility and safety. Pt encouraged to continue daily mobility to increase strength and function. Wife present throughout session.  ? ?HR 84-95 ?Pre gait 101/70, post gait 131/92 ?SpO2 98% RA   ?   ? ?Recommendations for follow up therapy are one component of a multi-disciplinary discharge planning process, led by the attending physician.  Recommendations may be updated based on patient status, additional functional criteria and insurance authorization. ? ?Follow Up Recommendations Outpatient PT ? ?  ?Assistance Recommended at Discharge Intermittent Supervision/Assistance  ?Patient can return home with the following ? A little help with walking and/or transfers;A little help with bathing/dressing/bathroom ? ?  ?Equipment Recommendations None recommended by PT  ?Recommendations for Other Services ?    ?  ?Functional Status Assessment Patient has had a recent decline in their functional status and demonstrates the ability to make significant improvements in function in a reasonable and predictable amount of time.  ? ?  ?Precautions / Restrictions Precautions ?Precautions: Fall ?Precaution Comments: 5/4 Rt radial cath  ? ?  ? ?Mobility ? Bed Mobility ?Overal bed mobility: Needs Assistance ?Bed Mobility: Supine to Sit, Sit to Supine ?  ?  ?Supine to sit: Min guard, HOB elevated ?  ?  ?General bed mobility comments: HOB 20 degrees with increased effort to rise, cues to limit use of RUE post cath ?   ? ?Transfers ?Overall transfer level: Needs assistance ?  ?Transfers: Sit to/from Stand ?Sit to Stand: Min assist ?  ?  ?  ?  ?  ?General transfer comment: pt with posterior LOB with initial stand with min assist to recover and maintain standing balance ?  ? ?Ambulation/Gait ?Ambulation/Gait assistance: Min assist ?Gait Distance (Feet): 320 Feet ?Assistive device: IV Pole ?Gait Pattern/deviations: Step-through pattern, Decreased stride length ?Gait velocity: 59ft/12 sec+=1.66 ?Gait velocity interpretation: <1.8 ft/sec, indicate of risk for recurrent falls ?  ?General Gait Details: pt initially attempting gait without UE support with 2 LOB and min assist to recover, pt utilized IV pole and at times addition of hand rail for gait due to unsteadiness ? ?Stairs ?  ?  ?  ?  ?  ? ?Wheelchair Mobility ?  ? ?Modified Rankin (Stroke Patients Only) ?  ? ?  ? ?Balance Overall balance assessment: Needs assistance ?Sitting-balance support: No upper extremity supported, Feet supported ?Sitting balance-Leahy Scale: Good ?Sitting balance - Comments: pt with good sitting balance ?  ?  ?Standing balance-Leahy Scale: Poor ?Standing balance comment: posterior LOB with standing, UE support for gait ?  ?  ?  ?  ?  ?  ?  ?  ?  ?  ?  ?   ? ? ? ?Pertinent Vitals/Pain Pain Assessment ?Pain Assessment: No/denies pain  ? ? ?Home Living Family/patient expects to be discharged to:: Private residence ?Living Arrangements: Spouse/significant other ?Available Help at Discharge: Family;Available 24 hours/day ?Type of Home: House ?Home Access: Stairs to enter ?Entrance Stairs-Rails: None ?Entrance Stairs-Number of Steps: 3 ?Alternate Level Stairs-Number of Steps: 14 ?Home Layout: Two level;Able to live  on main level with bedroom/bathroom ?Home Equipment: Gilmer Mor - single point ?   ?  ?Prior Function Prior Level of Function : Independent/Modified Independent ?  ?  ?  ?  ?  ?  ?  ?  ?  ? ? ?Hand Dominance  ?   ? ?  ?Extremity/Trunk Assessment  ? Upper  Extremity Assessment ?Upper Extremity Assessment: Overall WFL for tasks assessed ?  ? ?Lower Extremity Assessment ?Lower Extremity Assessment: Overall WFL for tasks assessed ?  ? ?Cervical / Trunk Assessment ?Cervical / Trunk Assessment: Normal  ?Communication  ? Communication: No difficulties  ?Cognition Arousal/Alertness: Awake/alert ?Behavior During Therapy: [facility] for tasks assessed/performed ?Overall Cognitive Status: Within Functional Limits for tasks assessed ?  ?  ?  ?  ?  ?  ?  ?  ?  ?  ?  ?  ?  ?  ?  ?  ?  ?  ?  ? ?  ?General Comments   ? ?  ?Exercises    ? ?Assessment/Plan  ?  ?PT Assessment Patient needs continued PT services  ?PT Problem List Decreased mobility;Decreased activity tolerance;Decreased balance;Decreased knowledge of use of DME ? ?   ?  ?PT Treatment Interventions Gait training;Balance training;Functional mobility training;Therapeutic activities;Patient/family education;Therapeutic exercise;DME instruction;Neuromuscular re-education;Stair training   ? 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 ?  ?Frequency Min 3X/week ?  ? ? ?Co-evaluation   ?  ?  ?  ?  ? ? ?  ?AM-PAC PT "6 Clicks" Mobility  ?Outcome Measure Help needed turning from your back to your side while in a flat bed without using bedrails?: A Little ?Help needed moving from lying on your back to sitting on the side of a flat bed without using bedrails?: A Little ?Help needed moving to and from a bed to a chair (including a wheelchair)?: A Little ?Help needed standing up from a chair using your arms (e.g., wheelchair or bedside chair)?: A Little ?Help needed to walk in hospital room?: A Little ?Help needed climbing 3-5 steps with a railing? : A Little ?6 Click Score: 18 ? ?  ?End of Session Equipment Utilized During Treatment:  Gait belt ?Activity Tolerance: Patient tolerated treatment well ?Patient left: in bed;with call bell/phone within reach;with family/visitor present ?Nurse Communication: Mobility status ?PT Visit Diagnosis: Other abnormalities of gait and mobility (R26.89);Difficulty in walking, not elsewhere classified (R26.2) ?  ? ?Time: 1040-1103 ?PT Time Calculation (min) (ACUTE ONLY): 23 min ? ? ?Charges:   PT Evaluation ?$PT Eval Moderate Complexity: 1 Mod ?PT Treatments ?$Gait Training: 8-22 mins ?  ?   ? ? ?Makenley Shimp P, PT ?Acute Rehabilitation Services ?Pager: [phone] ?Office: [phone] ? ? ?Ayomikun Starling B Marsha Hillman ?10/14/2021, 11:09 AM ? ?

## 2021-10-14 NOTE — Anesthesia Procedure Notes (Signed)
Procedure Name: [place] ?Date/Time: 10/14/2021 1:03 PM ?Performed by: Moshe Salisbury, CRNA ?Pre-anesthesia Checklist: Patient identified, Emergency Drugs available, Suction available and Patient being monitored ?Patient Re-evaluated:Patient Re-evaluated prior to induction ?Oxygen Delivery Method: Nasal cannula ?Placement Confirmation: positive ETCO2 ?Dental Injury: Teeth and Oropharynx as per pre-operative assessment  ? ? ? ? ?

## 2021-10-14 NOTE — H&P (View-Only) (Signed)
? ? ?Advanced Heart Failure Rounding Note ? ? ?Subjective:   ? ?Denies CP. Still SOB w/ activity but denies resting dyspnea. No orthopnea/PND.  ? ?He reports good UOP yesterday w/ IV Lasix but I/Os incomplete. Wt unchanged.  ? ?CVP 9. Co-ox 58% ? ?BMP pending  ? ? ?Objective:   ?Weight Range: ? ?Vital Signs:   ?Temp:  [98.1 ?F (36.7 ?C)-99.8 ?F (37.7 ?C)] 98.2 ?F (36.8 ?C) (05/08 0359) ?Pulse Rate:  [90-95] 90 (05/07 1711) ?Resp:  [20-22] 20 (05/08 0359) ?BP: (105-118)/(75-82) 107/78 (05/08 0359) ?SpO2:  [96 %-99 %] 98 % (05/08 0359) ?Weight:  [98.5 kg] 98.5 kg (05/08 0415) ?Last BM Date : 10/13/21 ? ?Weight change: ?Filed Weights  ? 10/12/21 0500 10/13/21 0338 10/14/21 0415  ?Weight: 98.8 kg 98.7 kg 98.5 kg  ? ? ?Intake/Output:  ? ?Intake/Output Summary (Last 24 hours) at 10/14/2021 0707 ?Last data filed at 10/13/2021 1346 ?Gross per 24 hour  ?Intake --  ?Output 700 ml  ?Net -700 ml  ?  ? ?PHYSICAL EXAM: ?CVP 9  ?General:  elderly male resting comfortably in bed. No respiratory difficulty ?HEENT: normal ?Neck: supple. Not well visualized (bearded). Carotids 2+ bilat; no bruits. No lymphadenopathy or thyromegaly appreciated. ?Cor: PMI nondisplaced. Regular rate & rhythm. No rubs, gallops or murmurs. ?Lungs: clear ?Abdomen: soft, nontender, nondistended. No hepatosplenomegaly. No bruits or masses. Good bowel sounds. ?Extremities: no cyanosis, clubbing, rash, edema + RUE PICC  ?Neuro: alert & oriented x 3, cranial nerves grossly intact. moves all 4 extremities w/o difficulty. Affect pleasant. ? ? ? ?Telemetry: NSR 80s Personally reviewed ? ? ?Labs: ?Basic Metabolic Panel: ?Recent Labs  ?Lab 10/10/21 ?1606 10/11/21 ?0143 10/12/21 ?1201 10/13/21 ?0054 10/13/21 ?1140  ?NA 134* 135 135 136  --   ?K 3.7 3.7 4.1 4.1  --   ?CL 102 105 106 107  --   ?CO2 23 21* 21* 20*  --   ?GLUCOSE 127* 117* 144* 123*  --   ?BUN 38* 41* 34* 32*  --   ?CREATININE 1.21 1.29* 1.13 1.08 1.14  ?CALCIUM 8.3* 8.1* 8.4* 8.4*  --   ? ? ?Liver Function  Tests: ?Recent Labs  ?Lab 10/10/21 ?1606  ?AST 98*  ?ALT 47*  ?ALKPHOS 51  ?BILITOT 1.0  ?PROT 6.6  ?ALBUMIN 2.8*  ? ?No results for input(s): LIPASE, AMYLASE in the last 168 hours. ?No results for input(s): AMMONIA in the last 168 hours. ? ?CBC: ?Recent Labs  ?Lab 10/11/21 ?0143 10/12/21 ?0422 10/13/21 ?0054 10/13/21 ?1140  ?WBC 10.2 10.6* 11.4* 11.8*  ?HGB 12.2* 13.0 12.6* 12.2*  ?HCT 34.4* 36.2* 36.1* 35.7*  ?MCV 86.0 85.8 86.6 87.5  ?PLT 234 274 298 286  ? ? ?Cardiac Enzymes: ?No results for input(s): CKTOTAL, CKMB, CKMBINDEX, TROPONINI in the last 168 hours. ? ?BNP: ?BNP (last 3 results) ?Recent Labs  ?  10/10/21 ?1606  ?BNP 1,564.6*  ? ? ?ProBNP (last 3 results) ?No results for input(s): PROBNP in the last 8760 hours. ? ? ? ?Other results: ? ?Imaging: ?No results found. ? ? ?Medications:   ? ? ?Scheduled Medications: ? aspirin EC  81 mg Oral Daily  ? atorvastatin  80 mg Oral Daily  ? Chlorhexidine Gluconate Cloth  6 each Topical Daily  ? dapagliflozin propanediol  10 mg Oral Daily  ? digoxin  0.125 mg Oral Daily  ? enoxaparin (LOVENOX) injection  40 mg Subcutaneous Q24H  ? feeding supplement  237 mL Oral BID BM  ? fluticasone furoate-vilanterol  1 puff Inhalation Daily  ? And  ? umeclidinium bromide  1 puff Inhalation Daily  ? gabapentin  300 mg Oral QHS  ? latanoprost  1 drop Both Eyes QHS  ? pantoprazole  40 mg Oral Daily  ? sodium chloride flush  10-40 mL Intracatheter Q12H  ? sodium chloride flush  3 mL Intravenous Q12H  ? spironolactone  12.5 mg Oral Daily  ? ? ?Infusions: ? sodium chloride    ? sodium chloride    ? ? ?PRN Medications: ?sodium chloride, acetaminophen, albuterol, guaiFENesin-dextromethorphan, melatonin, ondansetron (ZOFRAN) IV, sodium chloride flush, sodium chloride flush ? ? ?Assessment/Plan:  ? ?1. CAD with late presenting Inferior STEMI  ?-LHC 05/04: 100% m RCA and 90% m LAD.  ?-RCA managed medically d/t completed infarct.  ?-Echo EF 25%, RV moderately reduced, moderate to severe  MR ?-No s/s angina.  ?-Initial plan for PCI to LAD but with moderate to severe ischemic MR I think better option is CABG/MVR with LIMA to LAD. If not candidate can consider PCI LAD with mTEER ?- Discussed with Dr. Laneta Simmers. Agrees with both options ?- Will plan TEE today to further evaluate his MV. It has been scheduled for 1p. Orders written  ?- He got 2 doses of Plavix (last dose 5/6). ?- Continue ASA/statin ?  ?2. New systolic CHF/ICM ?- In setting of late presentation inferior STEMI ?-Echo EF 25-30%, RV moderately reduced, moderate to severe MR ?-Symptomatically much improved with GDMT initiation and diuresis ?-Co-ox 58%. CVP 9. Repeat IV Lasix 40 mg x 1  ?-Continue digoxin 0.125 ?-Continue spiro 12.5 mg daily ?-Continue farxiga 10 mg daily ?-Add losartan 12.5 daily ? ?3. Mitral regurgitation ?-Moderate to severe on echo this admit ?-Likely ischemic ?-TCTS has seen ?-TEE today ?  ?4. Possible AKI ?-Scr baseline 0.8-0.9, this admit 1.21>1.29 ?-Scr today pending  ?  ?5. Prediabetes ?-A1c 5.8 01/23 ?-Farxiga as above ? ?6. Physical deconditioning ?- PT/OT consult ? ?Length of Stay: 4 ? ? ?Brittainy Simmons PA-C  ?10/14/2021, 7:07 AM ? ?Advanced Heart Failure Team ?Pager [phone] (M-F; 7a - 4p)  ?Please contact [facility] for night-coverage after hours (4p -7a ) and weekends on [email or web address] ? ?Patient seen and examined with the above-signed Advanced Practice Provider and/or Housestaff. I personally reviewed laboratory data, imaging studies and relevant notes. I independently examined the patient and formulated the important aspects of the plan. I have edited the note to reflect any of my changes or salient points. I have personally discussed the plan with the patient and/or family. ? ?Dyspnea improved with diuresis. Denies CP, orthopnea or PND.  ? ?For TEE today ? ?General:  Sitting up in bed . No resp difficulty ?HEENT: normal ?Neck: supple. no JVD. Carotids 2+ bilat; no bruits. No lymphadenopathy or thryomegaly  appreciated. ?Cor: PMI nondisplaced. Regular rate & rhythm. No rubs, gallops or murmurs. ?Lungs: clear ?Abdomen: soft, nontender, nondistended. No hepatosplenomegaly. No bruits or masses. Good bowel sounds. ?Extremities: no cyanosis, clubbing, rash, edema ?Neuro: alert & orientedx3, cranial nerves grossly intact. moves all 4 extremities w/o difficulty. Affect pleasant ? ?Continue to titrate GDMT. For TEE today to re-assess MV. Needs aggressive PT/OT. ? ?Arvilla Meres, MD  ?9:54 AM ? ?

## 2021-10-14 NOTE — Progress Notes (Addendum)
? ? ?Advanced Heart Failure Rounding Note ? ? ?Subjective:   ? ?Denies CP. Still SOB w/ activity but denies resting dyspnea. No orthopnea/PND.  ? ?He reports good UOP yesterday w/ IV Lasix but I/Os incomplete. Wt unchanged.  ? ?CVP 9. Co-ox 58% ? ?BMP pending  ? ? ?Objective:   ?Weight Range: ? ?Vital Signs:   ?Temp:  [98.1 ?F (36.7 ?C)-99.8 ?F (37.7 ?C)] 98.2 ?F (36.8 ?C) (05/08 0359) ?Pulse Rate:  [90-95] 90 (05/07 1711) ?Resp:  [20-22] 20 (05/08 0359) ?BP: (105-118)/(75-82) 107/78 (05/08 0359) ?SpO2:  [96 %-99 %] 98 % (05/08 0359) ?Weight:  [98.5 kg] 98.5 kg (05/08 0415) ?Last BM Date : 10/13/21 ? ?Weight change: ?Filed Weights  ? 10/12/21 0500 10/13/21 0338 10/14/21 0415  ?Weight: 98.8 kg 98.7 kg 98.5 kg  ? ? ?Intake/Output:  ? ?Intake/Output Summary (Last 24 hours) at 10/14/2021 0707 ?Last data filed at 10/13/2021 1346 ?Gross per 24 hour  ?Intake --  ?Output 700 ml  ?Net -700 ml  ?  ? ?PHYSICAL EXAM: ?CVP 9  ?General:  elderly male resting comfortably in bed. No respiratory difficulty ?HEENT: normal ?Neck: supple. Not well visualized (bearded). Carotids 2+ bilat; no bruits. No lymphadenopathy or thyromegaly appreciated. ?Cor: PMI nondisplaced. Regular rate & rhythm. No rubs, gallops or murmurs. ?Lungs: clear ?Abdomen: soft, nontender, nondistended. No hepatosplenomegaly. No bruits or masses. Good bowel sounds. ?Extremities: no cyanosis, clubbing, rash, edema + RUE PICC  ?Neuro: alert & oriented x 3, cranial nerves grossly intact. moves all 4 extremities w/o difficulty. Affect pleasant. ? ? ? ?Telemetry: NSR 80s Personally reviewed ? ? ?Labs: ?Basic Metabolic Panel: ?Recent Labs  ?Lab 10/10/21 ?1606 10/11/21 ?0143 10/12/21 ?1201 10/13/21 ?0054 10/13/21 ?1140  ?NA 134* 135 135 136  --   ?K 3.7 3.7 4.1 4.1  --   ?CL 102 105 106 107  --   ?CO2 23 21* 21* 20*  --   ?GLUCOSE 127* 117* 144* 123*  --   ?BUN 38* 41* 34* 32*  --   ?CREATININE 1.21 1.29* 1.13 1.08 1.14  ?CALCIUM 8.3* 8.1* 8.4* 8.4*  --   ? ? ?Liver Function  Tests: ?Recent Labs  ?Lab 10/10/21 ?1606  ?AST 98*  ?ALT 47*  ?ALKPHOS 51  ?BILITOT 1.0  ?PROT 6.6  ?ALBUMIN 2.8*  ? ?No results for input(s): LIPASE, AMYLASE in the last 168 hours. ?No results for input(s): AMMONIA in the last 168 hours. ? ?CBC: ?Recent Labs  ?Lab 10/11/21 ?0143 10/12/21 ?0422 10/13/21 ?0054 10/13/21 ?1140  ?WBC 10.2 10.6* 11.4* 11.8*  ?HGB 12.2* 13.0 12.6* 12.2*  ?HCT 34.4* 36.2* 36.1* 35.7*  ?MCV 86.0 85.8 86.6 87.5  ?PLT 234 274 298 286  ? ? ?Cardiac Enzymes: ?No results for input(s): CKTOTAL, CKMB, CKMBINDEX, TROPONINI in the last 168 hours. ? ?BNP: ?BNP (last 3 results) ?Recent Labs  ?  10/10/21 ?1606  ?BNP 1,564.6*  ? ? ?ProBNP (last 3 results) ?No results for input(s): PROBNP in the last 8760 hours. ? ? ? ?Other results: ? ?Imaging: ?No results found. ? ? ?Medications:   ? ? ?Scheduled Medications: ? aspirin EC  81 mg Oral Daily  ? atorvastatin  80 mg Oral Daily  ? Chlorhexidine Gluconate Cloth  6 each Topical Daily  ? dapagliflozin propanediol  10 mg Oral Daily  ? digoxin  0.125 mg Oral Daily  ? enoxaparin (LOVENOX) injection  40 mg Subcutaneous Q24H  ? feeding supplement  237 mL Oral BID BM  ? fluticasone furoate-vilanterol  1 puff Inhalation Daily  ? And  ? umeclidinium bromide  1 puff Inhalation Daily  ? gabapentin  300 mg Oral QHS  ? latanoprost  1 drop Both Eyes QHS  ? pantoprazole  40 mg Oral Daily  ? sodium chloride flush  10-40 mL Intracatheter Q12H  ? sodium chloride flush  3 mL Intravenous Q12H  ? spironolactone  12.5 mg Oral Daily  ? ? ?Infusions: ? sodium chloride    ? sodium chloride    ? ? ?PRN Medications: ?sodium chloride, acetaminophen, albuterol, guaiFENesin-dextromethorphan, melatonin, ondansetron (ZOFRAN) IV, sodium chloride flush, sodium chloride flush ? ? ?Assessment/Plan:  ? ?1. CAD with late presenting Inferior STEMI  ?-LHC 05/04: 100% m RCA and 90% m LAD.  ?-RCA managed medically d/t completed infarct.  ?-Echo EF 25%, RV moderately reduced, moderate to severe  MR ?-No s/s angina.  ?-Initial plan for PCI to LAD but with moderate to severe ischemic MR I think better option is CABG/MVR with LIMA to LAD. If not candidate can consider PCI LAD with mTEER ?- Discussed with Dr. Laneta Simmers. Agrees with both options ?- Will plan TEE today to further evaluate his MV. It has been scheduled for 1p. Orders written  ?- He got 2 doses of Plavix (last dose 5/6). ?- Continue ASA/statin ?  ?2. New systolic CHF/ICM ?- In setting of late presentation inferior STEMI ?-Echo EF 25-30%, RV moderately reduced, moderate to severe MR ?-Symptomatically much improved with GDMT initiation and diuresis ?-Co-ox 58%. CVP 9. Repeat IV Lasix 40 mg x 1  ?-Continue digoxin 0.125 ?-Continue spiro 12.5 mg daily ?-Continue farxiga 10 mg daily ?-Add losartan 12.5 daily ? ?3. Mitral regurgitation ?-Moderate to severe on echo this admit ?-Likely ischemic ?-TCTS has seen ?-TEE today ?  ?4. Possible AKI ?-Scr baseline 0.8-0.9, this admit 1.21>1.29 ?-Scr today pending  ?  ?5. Prediabetes ?-A1c 5.8 01/23 ?-Farxiga as above ? ?6. Physical deconditioning ?- PT/OT consult ? ?Length of Stay: 4 ? ? ?Brittainy Simmons PA-C  ?10/14/2021, 7:07 AM ? ?Advanced Heart Failure Team ?Pager [phone] (M-F; 7a - 4p)  ?Please contact [facility] for night-coverage after hours (4p -7a ) and weekends on [email or web address] ? ?Patient seen and examined with the above-signed Advanced Practice Provider and/or Housestaff. I personally reviewed laboratory data, imaging studies and relevant notes. I independently examined the patient and formulated the important aspects of the plan. I have edited the note to reflect any of my changes or salient points. I have personally discussed the plan with the patient and/or family. ? ?Dyspnea improved with diuresis. Denies CP, orthopnea or PND.  ? ?For TEE today ? ?General:  Sitting up in bed . No resp difficulty ?HEENT: normal ?Neck: supple. no JVD. Carotids 2+ bilat; no bruits. No lymphadenopathy or thryomegaly  appreciated. ?Cor: PMI nondisplaced. Regular rate & rhythm. No rubs, gallops or murmurs. ?Lungs: clear ?Abdomen: soft, nontender, nondistended. No hepatosplenomegaly. No bruits or masses. Good bowel sounds. ?Extremities: no cyanosis, clubbing, rash, edema ?Neuro: alert & orientedx3, cranial nerves grossly intact. moves all 4 extremities w/o difficulty. Affect pleasant ? ?Continue to titrate GDMT. For TEE today to re-assess MV. Needs aggressive PT/OT. ? ?Arvilla Meres, MD  ?9:54 AM ? ?

## 2021-10-14 NOTE — Evaluation (Signed)
Occupational Therapy Evaluation ?Patient Details ?Name: Keith Jenkins ?MRN: [id number] ?DOB: 02-15-1946 ?Today's Date: 10/14/2021 ? ? ?History of Present Illness 76 yo admitted 5/4 with SOB and chest pain with last presentation STEMI. Cardiac cath 5/4. PMhx: pre diabetes, HLD, GERD, arthritis, glaucoma  ? ?Clinical Impression ?  ?Pt demonstrated good balance with functional transfers to the toilet, and simulated tub. As well as with simulated selfcare sit to stand.  No LOB noted when retrieving items from the floor without UE support as well.  Feel he will have PRN supervision at discharge from his spouse and some from his daughter as well.  No current OT needs at this time or follow-up OT recommended.   ?   ? ?Recommendations for follow up therapy are one component of a multi-disciplinary discharge planning process, led by the attending physician.  Recommendations may be updated based on patient status, additional functional criteria and insurance authorization.  ? ?Follow Up Recommendations ? No OT follow up  ?  ?Assistance Recommended at Discharge PRN  ?Patient can return home with the following Assist for transportation;Assistance with cooking/housework ? ?  ?Functional Status Assessment ? Patient has not had a recent decline in their functional status  ?Equipment Recommendations ? None recommended by OT  ?  ?   ?Precautions / Restrictions Precautions ?Precautions: Fall ?Precaution Comments: 5/4 Rt radial cath ?Restrictions ?Weight Bearing Restrictions: No  ? ?  ? ?Mobility Bed Mobility ?Overal bed mobility: Modified Independent ?  ?  ?  ?  ?  ?  ?  ?  ? ?Transfers ?Overall transfer level: Modified independent ?  ?Transfers: Sit to/from Stand ?Sit to Stand: Modified independent (Device/Increase time) ?  ?  ?  ?  ?  ?General transfer comment: Pt completed toilet transfers without any assistive device at modified independent level as well as simulated shower/tub transfer with UE support on the wall at the same level. ?   ? ?  ?Balance Overall balance assessment: Mild deficits observed, not formally tested ?Sitting-balance support: Feet supported, No upper extremity supported ?Sitting balance-Leahy Scale: Normal ?  ?  ?Standing balance support: No upper extremity supported, During functional activity ?Standing balance-Leahy Scale: Good ?Standing balance comment: No LOB noted during session retrieving 3 items from the floor or during any transfers. ?  ?  ?  ?  ?  ?  ?  ?  ?  ?  ?  ?   ? ?ADL either performed or assessed with clinical judgement  ? ?ADL Overall ADL's : Modified independent ?  ?  ?  ?  ?  ?  ?  ?  ?  ?  ?  ?  ?  ?  ?  ?  ?  ?  ?  ?General ADL Comments: Pt is currently modified independent for simulated selfcare tasks, toileting, and simulated shower tub transfers.  Therapist discussed with family and pt the possible need for a shower seat at home if pt is feeling weak or balance worsens from what it was on eval.  They voice understanding and will purchase from outside source when needed.  ? ? ? ?Vision Baseline Vision/History: 3 Glaucoma ?Ability to See in Adequate Light: 0 Adequate ?Patient Visual Report: No change from baseline ?Vision Assessment?: No apparent visual deficits  ?   ?Perception Perception ?Perception: Within Functional Limits ?  ?Praxis Praxis ?Praxis: Intact ?  ? ?Pertinent Vitals/Pain Pain Assessment ?Pain Assessment: No/denies pain  ? ? ? ?Hand Dominance Right ?  ?Extremity/Trunk Assessment  Upper Extremity Assessment ?Upper Extremity Assessment: Overall WFL for tasks assessed ?  ?Lower Extremity Assessment ?Lower Extremity Assessment: Defer to PT evaluation ?  ?Cervical / Trunk Assessment ?Cervical / Trunk Assessment: Normal ?  ?Communication Communication ?Communication: No difficulties ?  ?Cognition Arousal/Alertness: Awake/alert ?Behavior During Therapy: [facility] for tasks assessed/performed ?Overall Cognitive Status: Within Functional Limits for tasks assessed ?  ?  ?  ?  ?  ?  ?  ?  ?  ?  ?  ?  ?  ?   ?  ?  ?  ?  ?  ?   ?   ?   ? ? ?Home Living Family/patient expects to be discharged to:: Private residence ?Living Arrangements: Spouse/significant other ?Available Help at Discharge: Family;Available 24 hours/day ?Type of Home: House ?Home Access: Stairs to enter ?Entrance Stairs-Number of Steps: 3 ?Entrance Stairs-Rails: None ?Home Layout: Two level;Able to live on main level with bedroom/bathroom ?Alternate Level Stairs-Number of Steps: 14 ?Alternate Level Stairs-Rails: Left ?Bathroom Shower/Tub: Tub/shower unit ?  ?Bathroom Toilet: Standard ?  ?  ?Home Equipment: [facility] - single point ?  ?  ?  ? ?  ?Prior Functioning/Environment Prior Level of Function : Independent/Modified Independent ?  ?  ?  ?  ?  ?  ?  ?  ?  ? ?  ?  ?   ?   ?   ?   ?   ? ?   ?AM-PAC OT "6 Clicks" Daily Activity     ?Outcome Measure Help from another person eating meals?: None ?Help from another person taking care of personal grooming?: None ?Help from another person toileting, which includes using toliet, bedpan, or urinal?: None ?Help from another person bathing (including washing, rinsing, drying)?: None ?Help from another person to put on and taking off regular upper body clothing?: None ?Help from another person to put on and taking off regular lower body clothing?: None ?6 Click Score: 24 ?  ?End of Session Equipment Utilized During Treatment: Gait belt ?Nurse Communication: Mobility status ? ?Activity Tolerance: Patient tolerated treatment well ?Patient left: in bed ? ?   ?              ?Time: 1140-1201 ?OT Time Calculation (min): 21 min ?Charges:  OT General Charges ?$OT Visit: 1 Visit ?OT Evaluation ?$OT Eval Low Complexity: 1 Low ? ?Willye Javier OTR/L ?10/14/2021, 12:59 PM ?

## 2021-10-14 NOTE — Progress Notes (Signed)
?  Echocardiogram ?Echocardiogram Transesophageal has been performed. ? ?Augustine Radar ?10/14/2021, 2:00 PM ?

## 2021-10-15 ENCOUNTER — Encounter (HOSPITAL_COMMUNITY): Payer: Self-pay | Admitting: Internal Medicine

## 2021-10-15 ENCOUNTER — Other Ambulatory Visit (HOSPITAL_COMMUNITY): Payer: Self-pay

## 2021-10-15 DIAGNOSIS — I5021 Acute systolic (congestive) heart failure: Secondary | ICD-10-CM | POA: Diagnosis not present

## 2021-10-15 DIAGNOSIS — I340 Nonrheumatic mitral (valve) insufficiency: Secondary | ICD-10-CM | POA: Diagnosis not present

## 2021-10-15 LAB — COOXEMETRY PANEL
Carboxyhemoglobin: 0.6 % (ref 0.5–1.5)
Methemoglobin: <0.7 % (ref 0.0–1.5)
O2 Saturation: 56.1 %
Total hemoglobin: 11.8 g/dL — ABNORMAL LOW (ref 12.0–16.0)

## 2021-10-15 LAB — BASIC METABOLIC PANEL
Anion gap: 6 (ref 5–15)
BUN: 34 mg/dL — ABNORMAL HIGH (ref 8–23)
CO2: 22 mmol/L (ref 22–32)
Calcium: 7.6 mg/dL — ABNORMAL LOW (ref 8.9–10.3)
Chloride: 109 mmol/L (ref 98–111)
Creatinine, Ser: 1.09 mg/dL (ref 0.61–1.24)
GFR, Estimated: >60 mL/min (ref >60)
Glucose, Bld: 116 mg/dL — ABNORMAL HIGH (ref 70–99)
Potassium: 4.1 mmol/L (ref 3.5–5.1)
Sodium: 137 mmol/L (ref 135–145)

## 2021-10-15 LAB — CBC
HCT: 34.7 % — ABNORMAL LOW (ref 39.0–52.0)
Hemoglobin: 12.3 g/dL — ABNORMAL LOW (ref 13.0–17.0)
MCH: 31.2 pg (ref 26.0–34.0)
MCHC: 35.4 g/dL (ref 30.0–36.0)
MCV: 88.1 fL (ref 80.0–100.0)
Platelets: 298 10*3/uL (ref 150–400)
RBC: 3.94 MIL/uL — ABNORMAL LOW (ref 4.22–5.81)
RDW: 14 % (ref 11.5–15.5)
WBC: 11 10*3/uL — ABNORMAL HIGH (ref 4.0–10.5)
nRBC: 0 % (ref 0.0–0.2)

## 2021-10-15 MED ORDER — SPIRONOLACTONE 25 MG PO TABS
25.0000 mg | ORAL_TABLET | Freq: Every day | ORAL | Status: DC
  Administered 2021-10-15 – 2021-10-16 (×2): 25 mg via ORAL
  Filled 2021-10-15 (×2): qty 1

## 2021-10-15 MED ORDER — LOSARTAN POTASSIUM 25 MG PO TABS
12.5000 mg | ORAL_TABLET | Freq: Every day | ORAL | Status: DC
  Administered 2021-10-15: 12.5 mg via ORAL
  Filled 2021-10-15: qty 1

## 2021-10-15 NOTE — TOC Benefit Eligibility Note (Signed)
Patient Advocate Encounter ? ?Insurance verification completed.   ? ?The patient is currently admitted and upon discharge could be taking Farxiga 10 mg. ? ?The current 30 day co-pay is, $47.00.  ? ?The patient is currently admitted and upon discharge could be taking Jardiance 10 mg. ? ?The current 30 day co-pay is, $47.00.  ? ?The patient is currently admitted and upon discharge could be taking Entresto 24-26 mg. ? ?The current 30 day co-pay is, $47.00.  ? ?The patient is insured through [employer] Part D  ? ? ?Roland Earl, CPhT ?Pharmacy Patient Advocate Specialist ?[facility] Patient Advocate Team ?Direct Number: [phone]  Fax: [phone] ? ? ? ? ? ?  ?

## 2021-10-15 NOTE — TOC Initial Note (Signed)
Transition of Care (TOC) - Initial/Assessment Note  ? ? ?Patient Details  ?Name: Keith Jenkins ?MRN: [id number] ?Date of Birth: 1945-12-30 ? ?Transition of Care [facility]) CM/SW Contact:    ?Mariea Stable Davene Costain, RN ?Phone Number: [phone] ?10/15/2021, 9:42 AM ? ?Clinical Narrative:                 ?HF TOC spoke to pt, wife and dtr at bedside. Pt states his dtr is an EMS and helps with medication and blood pressure monitoring. States they do not feel HH is needed. Pt uses his cane at home. Has scale in the home. Will continue to monitor for dc needs.  ? ?Expected Discharge Plan: Home/Self Care ?Barriers to Discharge: Continued Medical Work up ? ? ?Patient Goals and CMS Choice ?Patient states their goals for this hospitalization and ongoing recovery are:: wants to remain independent ?CMS Medicare.gov Compare Post Acute Care list provided to:: Patient ?  ? ?Expected Discharge Plan and Services ?Expected Discharge Plan: Home/Self Care ?  ?Discharge Planning Services: CM Consult ?Post Acute Care Choice: Home Health ?  ?                ?  ?  ?  ?  ?  ?  ?  ?  ?  ?  ? ?Prior Living Arrangements/Services ?  ?Lives with:: Spouse ?Patient language and need for interpreter reviewed:: Yes ?Do you feel safe going back to the place where you live?: Yes      ?Need for Family Participation in Patient Care: No (Comment) ?Care giver support system in place?: No (comment) ?Current home services: DME [facility]) ?Criminal Activity/Legal Involvement Pertinent to Current Situation/Hospitalization: No - Comment as needed ? ?Activities of Daily Living ?Home Assistive Devices/Equipment: Cane (specify quad or straight) ?ADL Screening (condition at time of admission) ?Patient's cognitive ability adequate to safely complete daily activities?: Yes ?Is the patient deaf or have difficulty hearing?: No ?Does the patient have difficulty seeing, even when wearing glasses/contacts?: No ?Does the patient have difficulty concentrating, remembering, or making  decisions?: No ?Patient able to express need for assistance with ADLs?: Yes ?Does the patient have difficulty dressing or bathing?: No ?Independently performs ADLs?: Yes (appropriate for developmental age) ?Does the patient have difficulty walking or climbing stairs?: No ?Weakness of Legs: None ?Weakness of Arms/Hands: None ? ?Permission Sought/Granted ?Permission sought to share information with : Case Manager, Family Supports, PCP ?Permission granted to share information with : Yes, Verbal Permission Granted ? Share Information with NAME: Macklin Jacquin ?   ? Permission granted to share info w Relationship: wife ? Permission granted to share info w Contact Information: [phone] ? ?Emotional Assessment ?Appearance:: Appears stated age ?Attitude/Demeanor/Rapport: Gracious ?Affect (typically observed): Accepting ?Orientation: : Oriented to Self, Oriented to Place, Oriented to  Time, Oriented to Situation ?  ?Psych Involvement: No (comment) ? ?Admission diagnosis:  NSTEMI (non-ST elevated myocardial infarction) (HCC) [I21.4] ?Patient Active Problem List  ? Diagnosis Date Noted  ? Nonrheumatic mitral valve regurgitation   ? NSTEMI (non-ST elevated myocardial infarction) (HCC) 10/10/2021  ? STEMI (ST elevation myocardial infarction) (HCC) 10/10/2021  ? Acute systolic heart failure (HCC) 10/10/2021  ? Hyperlipidemia 10/10/2021  ? ?PCP:  [name], NP ?Pharmacy:   ?[facility] [place], [place] - [address] ?[address] ?[place] [address] ?Phone: [phone] Fax: [phone] ? ? ? ? ?Social Determinants of Health (SDOH) Interventions ?  ? ?Readmission Risk Interventions ?   ?  View : No data to display.  ?  ?  ?  ? ? ? ?

## 2021-10-15 NOTE — Progress Notes (Signed)
?  Mobility Specialist Criteria Algorithm Info. ? ? 10/15/21 1522  ?Oxygen Therapy  ?SpO2 95 %  ?O2 Device Room Air  ?Patient Activity (if Appropriate) Ambulating  ?Mobility  ?Activity Ambulated with assistance in hallway  ?Range of Motion/Exercises Active;All extremities  ?Level of Assistance Contact guard assist, steadying assist  ?Assistive Device Other (Comment) ?(IV pole)  ?Distance Ambulated (ft) 320 ft  ?Activity Response Tolerated well  ? ?Patient received in supine asleep but easily aroused. Agreeable to participate in mobility. Ambulated in hallway min guard with slow steady gait. Upon returning to room, completed IS education with receptive teach back and demo. Pt demo x5 reps on IS resulting in an inspiratory capacity of 1077m. Tolerated well without complaint or incident. Was left dangling EOB  with all needs met, call bell in reach.  ? ?10/15/2021 ?3:40 PM ? ?JMartiniqueTripp, CMS, BS EXP ?Acute Rehabilitation Services  ?PECXFQ:[phone]?Office: [phone]? ?

## 2021-10-15 NOTE — Progress Notes (Addendum)
? ? ?Advanced Heart Failure Rounding Note ? ? ?Subjective:   ? ?TEE -Posterior leaflet restricted. Moderate 2-3+ MR . RV mild HK , LV 25-30%  ? ?CO-OX 56%.  ? ?Denies SOB. Denies chest pain.  ? ? ?Objective:   ?Weight Range: ? ?Vital Signs:   ?Temp:  [97.5 ?F (36.4 ?C)-98.8 ?F (37.1 ?C)] 98.7 ?F (37.1 ?C) (05/09 0542) ?Pulse Rate:  [78-96] 84 (05/09 0542) ?Resp:  [15-22] 19 (05/09 0542) ?BP: (91-131)/(70-92) 96/74 (05/09 0542) ?SpO2:  [94 %-100 %] 95 % (05/09 0815) ?Last BM Date : 10/14/21 ? ?Weight change: ?Filed Weights  ? 10/12/21 0500 10/13/21 0338 10/14/21 0415  ?Weight: 98.8 kg 98.7 kg 98.5 kg  ? ? ?Intake/Output:  ? ?Intake/Output Summary (Last 24 hours) at 10/15/2021 0839 ?Last data filed at 10/14/2021 2053 ?Gross per 24 hour  ?Intake 830 ml  ?Output 1550 ml  ?Net -720 ml  ? CVP 6-7  ? ?PHYSICAL EXAM: ?General:  No resp difficulty ?HEENT: normal ?Neck: supple. No JVD. Carotids 2+ bilat; no bruits. No lymphadenopathy or thryomegaly appreciated. ?Cor: PMI nondisplaced. Regular rate & rhythm. No rubs, gallops or murmurs. ?Lungs: clear ?Abdomen: soft, nontender, nondistended. No hepatosplenomegaly. No bruits or masses. Good bowel sounds. ?Extremities: no cyanosis, clubbing, rash, edema. RUE PICC  ?Neuro: alert & orientedx3, cranial nerves grossly intact. moves all 4 extremities w/o difficulty. Affect pleasant ? ? ? ?Telemetry: SR 80s personally checked ?Labs: ?Basic Metabolic Panel: ?Recent Labs  ?Lab 10/11/21 ?0143 10/12/21 ?1201 10/13/21 ?0054 10/13/21 ?1140 10/14/21 ?0750 10/15/21 ?0500  ?NA 135 135 136  --  136 137  ?K 3.7 4.1 4.1  --  4.2 4.1  ?CL 105 106 107  --  105 109  ?CO2 21* 21* 20*  --  24 22  ?GLUCOSE 117* 144* 123*  --  114* 116*  ?BUN 41* 34* 32*  --  33* 34*  ?CREATININE 1.29* 1.13 1.08 1.14 1.09 1.09  ?CALCIUM 8.1* 8.4* 8.4*  --  8.0* 7.6*  ? ? ?Liver Function Tests: ?Recent Labs  ?Lab 10/10/21 ?1606  ?AST 98*  ?ALT 47*  ?ALKPHOS 51  ?BILITOT 1.0  ?PROT 6.6  ?ALBUMIN 2.8*  ? ?No results for  input(s): LIPASE, AMYLASE in the last 168 hours. ?No results for input(s): AMMONIA in the last 168 hours. ? ?CBC: ?Recent Labs  ?Lab 10/12/21 ?0422 10/13/21 ?0054 10/13/21 ?1140 10/14/21 ?0750 10/15/21 ?0500  ?WBC 10.6* 11.4* 11.8* 10.9* 11.0*  ?HGB 13.0 12.6* 12.2* 11.9* 12.3*  ?HCT 36.2* 36.1* 35.7* 33.5* 34.7*  ?MCV 85.8 86.6 87.5 86.8 88.1  ?PLT 274 298 286 281 298  ? ? ?Cardiac Enzymes: ?No results for input(s): CKTOTAL, CKMB, CKMBINDEX, TROPONINI in the last 168 hours. ? ?BNP: ?BNP (last 3 results) ?Recent Labs  ?  10/10/21 ?1606  ?BNP 1,564.6*  ? ? ?ProBNP (last 3 results) ?No results for input(s): PROBNP in the last 8760 hours. ? ? ? ?Other results: ? ?Imaging: ?No results found. ? ? ?Medications:   ? ? ?Scheduled Medications: ? aspirin EC  81 mg Oral Daily  ? atorvastatin  80 mg Oral Daily  ? Chlorhexidine Gluconate Cloth  6 each Topical Daily  ? dapagliflozin propanediol  10 mg Oral Daily  ? digoxin  0.125 mg Oral Daily  ? enoxaparin (LOVENOX) injection  40 mg Subcutaneous Q24H  ? feeding supplement  237 mL Oral BID BM  ? fluticasone furoate-vilanterol  1 puff Inhalation Daily  ? And  ? umeclidinium bromide  1 puff Inhalation  Daily  ? gabapentin  300 mg Oral QHS  ? latanoprost  1 drop Both Eyes QHS  ? pantoprazole  40 mg Oral Daily  ? sodium chloride flush  10-40 mL Intracatheter Q12H  ? sodium chloride flush  3 mL Intravenous Q12H  ? spironolactone  12.5 mg Oral Daily  ? ? ?Infusions: ? sodium chloride    ? ? ?PRN Medications: ?sodium chloride, acetaminophen, albuterol, guaiFENesin-dextromethorphan, melatonin, ondansetron (ZOFRAN) IV, sodium chloride flush, sodium chloride flush ? ? ?Assessment/Plan:  ? ?1. CAD with late presenting Inferior STEMI  ?-LHC 05/04: 100% m RCA and 90% m LAD.  ?-RCA managed medically d/t completed infarct.  ?-Echo EF 25%, RV moderately reduced, moderate to severe MR ?-No s/s angina.  ?-Initial plan for PCI to LAD but with moderate to severe ischemic MR I think better option is  CABG/MVR with LIMA to LAD. If not candidate can consider PCI LAD with mTEER ?- Discussed with Dr. Laneta Simmers. Agrees with both options ?- He got 2 doses of Plavix (last dose 5/6). ?- Continue ASA/statin ?- Plan for CABG down the road.  ? ?  ?2. New systolic CHF/ICM ?- In setting of late presentation inferior STEMI ?-Echo EF 25-30%, RV moderately reduced, moderate to severe MR ?-Symptomatically much improved with GDMT initiation and diuresis ?-Co-ox 56%. CVP 6-7   ?-Continue digoxin 0.125 ?-Increase spiro 25 mg daily. ?-Continue farxiga 10 mg daily ?-Continue losartan 12.5 daily ?- renal function stable ? ?3. Mitral regurgitation ?-Moderate to severe on echo this admit ?-severe ischemic MR.  ?-TCTS has seen ?-TEE -'Posterior leaflet restricted. Moderate 2-3+ MR  ?  ?4. Possible AKI ?-Scr baseline 0.8-0.9, this admit 1.21>1.29>1.09 ?  ?5. Prediabetes ?-A1c 5.8 01/23 ?-Farxiga as above ? ?6. Physical deconditioning ?- PT/OT consult ? ? ?Plan for CABG + MVR down the road. Plan for d/c home prior to surgery.  ? ?Consult cardiac rehab.  ? ? ?Length of Stay: 5 ? ? ?Amy Clegg NP-C  ?10/15/2021, 8:39 AM ? ?Advanced Heart Failure Team ?Pager [phone] (M-F; 7a - 4p)  ?Please contact [facility] for night-coverage after hours (4p -7a ) and weekends on [email or web address] ? ?Patient seen and examined with the above-signed Advanced Practice Provider and/or Housestaff. I personally reviewed laboratory data, imaging studies and relevant notes. I independently examined the patient and formulated the important aspects of the plan. I have edited the note to reflect any of my changes or salient points. I have personally discussed the plan with the patient and/or family. ? ?Feels ok. Denies SOB, orthopnea or PND. Remains weak.  ? ?General:  Sitting up in bed No resp difficulty ?HEENT: normal ?Neck: supple. no JVD. Carotids 2+ bilat; no bruits. No lymphadenopathy or thryomegaly appreciated. ?Cor: PMI nondisplaced. Regular rate & rhythm. No rubs,  gallops or murmurs. ?Lungs: clear ?Abdomen: soft, nontender, nondistended. No hepatosplenomegaly. No bruits or masses. Good bowel sounds. ?Extremities: no cyanosis, clubbing, rash, edema ?Neuro: alert & orientedx3, cranial nerves grossly intact. moves all 4 extremities w/o difficulty. Affect pleasant ? ?Will plan for MVR/CABG in several week to let RV and LV recover post-MI. Continue to titrate GDMT. Cardiac rehab to see. D/w Dr. Laneta Simmers.  ? ?Arvilla Meres, MD  ?11:33 AM ? ?

## 2021-10-15 NOTE — Plan of Care (Signed)

## 2021-10-15 NOTE — Discharge Summary (Addendum)
?Advanced Heart Failure Team ? ?Discharge Summary  ? ?Patient ID: Keith Jenkins ?MRN: [id number], DOB/AGE: [birth year] 76 y.o. Admit date: 10/10/2021 ?D/C date:     10/16/2021  ? ?Primary Discharge Diagnoses:  ?CAD/late presenting inferior STEMI ?New systolic CHF ?Ischemic cardiomyopathy ?Mitral regurgitation ?Prediabetes ? ?Hospital Course:  ?76 y.o. male with history of COPD, prediabetes, GERD/dysphagia, HLD. No prior cardiac history.  ?  ?He was transferred to [facility] from [facility] on 05/04 for late presentation inferior ST elevation MI. Chest pressure and dyspnea began about 5 days prior to presentation.  ?  ?LHC total occlusion mRCA and 90% mLAD. RCA managed medically d/t completed infarct.  Plan to optimize medical therapy and arrange LAD intervention at later time. ?  ?Echo: EF 20-25%, RWMA, RV moderately reduced, moderate to severe ischemic MR, aortic root 4.3 cm, dilated IVC with estimated RAP 8 mmHg. This was followed by TEE which showed posterior leaflet restriction w/ moderate 2-3+ MR w/ flow reversal in pulmonary veins. LVEF 25-30%  ?  ?Blood pressure was soft with narrow pulse pressure. D/t concern he may not tolerate GDMT, advanced heart failure asked to evaluate patient.  He was diuresed w/ IV Lasix and GDMT initiated.  ? ?He was seen by Dr. Cyndia Bent. Plan will be for MVR/CABG in several week to let RV and LV recover post-MI.  ? ?On 5/10, he was last seen and examined by Dr. Haroldine Laws and felt stable for d/c home. He was chest pain free. F/u arranged in the [facility] on 5/16.  ? ? ? ?Hospital Course by Problem: ? 1. CAD with late presenting Inferior STEMI  ?-LHC 05/04: 100% m RCA and 90% m LAD.  ?-RCA managed medically d/t completed infarct.  ?-Echo EF 25%, RV moderately reduced, moderate to severe MR ?-No s/s angina.  ?-Initial plan for PCI to LAD but with moderate to severe ischemic MR I think better option is CABG/MVR with LIMA to LAD. If not candidate can consider PCI LAD with mTEER ?- Discussed with Dr.  Cyndia Bent. Agrees with both options ?- He got 2 doses of Plavix (last dose 5/6). ?- Continue ASA/statin ?- Plan for CABG down the road.  ?  ?  ?2. New systolic CHF/ICM ?- In setting of late presentation inferior STEMI ?-Echo EF 25-30%, RV moderately reduced, moderate to severe MR ?-Symptomatically much improved with GDMT initiation and diuresis ?-Co-ox 56%. CVP 6-7   ?-Continue digoxin 0.125 ?-Increase spiro 25 mg daily. ?-Continue farxiga 10 mg daily ?-Continue losartan 12.5 daily ?- renal function stable ?  ?3. Mitral regurgitation ?-Moderate to severe on echo this admit ?-severe ischemic MR.  ?-TCTS has seen ?-TEE -'Posterior leaflet restricted. Moderate 2-3+ MR  ?  ?4. Possible AKI ?-Scr baseline 0.8-0.9, this admit 1.21>1.29>1.09 ?  ?5. Prediabetes ?-A1c 5.8 01/23 ?-Farxiga as above ?  ?6. Physical deconditioning ?- PT/OT assessed. No PT f/u recommended  ? ?Discharge Weight Range: 217 lb  ?Discharge Vitals: Blood pressure 91/68, pulse 76, temperature 98 ?F (36.7 ?C), temperature source Oral, resp. rate 15, height 6\' 1"  (1.854 m), weight 98.5 kg, SpO2 95 %. ? ?Labs: ?Lab Results  ?Component Value Date  ? WBC 11.3 (H) 10/16/2021  ? HGB 11.9 (L) 10/16/2021  ? HCT 34.8 (L) 10/16/2021  ? MCV 87.2 10/16/2021  ? PLT 317 10/16/2021  ?  ?Recent Labs  ?Lab 10/10/21 ?1606 10/11/21 ?0143 10/16/21 ?0420  ?NA 134*   < > 136  ?K 3.7   < > 4.4  ?CL 102   < >  107  ?CO2 23   < > 25  ?BUN 38*   < > 34*  ?CREATININE 1.21   < > 1.10  ?CALCIUM 8.3*   < > 8.2*  ?PROT 6.6  --   --   ?BILITOT 1.0  --   --   ?ALKPHOS 51  --   --   ?ALT 47*  --   --   ?AST 98*  --   --   ?GLUCOSE 127*   < > 120*  ? < > = values in this interval not displayed.  ? ?Lab Results  ?Component Value Date  ? CHOL 194 07/09/2021  ? HDL 55 07/09/2021  ? LDLCALC 129 (H) 07/09/2021  ? TRIG 55 07/09/2021  ? ?BNP (last 3 results) ?Recent Labs  ?  10/10/21 ?1606  ?BNP 1,564.6*  ? ? ?ProBNP (last 3 results) ?No results for input(s): PROBNP in the last 8760  hours. ? ? ?Diagnostic Studies/Procedures  ? ?LHC 10/10/21 ??  Mid RCA lesion is 100% stenosed.  Faint left to right collaterals. ??  Mid LAD lesion is 90% stenosed, at the origin of a large septal. ??  Ost LAD to Prox LAD lesion is 40% stenosed. ??  There is severe left ventricular systolic dysfunction. ??  LV end diastolic pressure is moderately elevated. ??  The left ventricular ejection fraction is less than 25% by visual estimate. ??  There is no aortic valve stenosis. ? ? ?2D Echo 10/10/21 ? 1. Akinesis of the inferior/ inferoseptal/ inferolateral wall,  ?hypokinesis of the anterolateral wall basal-mid segments. Left ventricular  ?ejection fraction, by estimation, is 30 to 35%. The left ventricle has  ?moderately decreased function. The left  ?ventricular internal cavity size was mildly to moderately dilated. There  ?is mild left ventricular hypertrophy. Left ventricular diastolic  ?parameters are indeterminate.  ? 2. Right ventricular systolic function is moderately reduced. The right  ?ventricular size is normal.  ? 3. Left atrial size was moderately dilated.  ? 4. Ischemic MR. The mitral valve is grossly normal. Mild to moderate  ?mitral valve regurgitation.  ? 5. The aortic valve is calcified. There is moderate calcification of the  ?aortic valve. Aortic valve regurgitation is mild. Aortic valve  ?sclerosis/calcification is present, without any evidence of aortic  ?stenosis.  ? 6. Aortic small aortic root aneurysm 4.3 cm.  ? 7. The inferior vena cava is dilated in size with >50% respiratory  ?variability, suggesting right atrial pressure of 8 mmHg.  ? ?TEE 10/14/21 ?LEFT VENTRICLE: EF = 25-30%. Inferior posterior AK ? ?RIGHT VENTRICLE: Mild HK ?  ?LEFT ATRIUM: Normal ?  ?LEFT ATRIAL APPENDAGE: No thrombus.  ?  ?RIGHT ATRIUM: Normal ?  ?AORTIC VALVE:  Trileaflet. Calcified. Mild to moderate AI ?  ?MITRAL VALVE:    Posterior leaflet restricted. Moderate 2-3+ MR with flow reversal in pulmoanry veins  ?  ?TRICUSPID  VALVE: Normal. Mild TR ?  ?PULMONIC VALVE: Grossly normal. ?  ?INTERATRIAL SEPTUM: No PFO or ASD. ?  ?PERICARDIUM: Small effusion next RV ?  ?DESCENDING AORTA: Mild to moderate plaque  ? ? ?Discharge Medications  ? ?Allergies as of 10/16/2021   ?No Known Allergies ?  ? ?  ?Medication List  ?  ? ?STOP taking these medications   ? ?azithromycin 250 MG tablet ?Commonly known as: ZITHROMAX ?  ?meloxicam 7.5 MG tablet ?Commonly known as: MOBIC ?  ?sildenafil 50 MG tablet ?Commonly known as: Viagra ?  ?tadalafil 10 MG tablet ?Commonly known  as: CIALIS ?  ? ?  ? ?TAKE these medications   ? ?albuterol 108 (90 Base) MCG/ACT inhaler ?Commonly known as: VENTOLIN HFA ?Inhale 2 puffs into the lungs every 6 (six) hours as needed for wheezing or shortness of breath. ?  ?Aspirin Low Dose 81 MG EC tablet ?Generic drug: aspirin ?Take 1 tablet (81 mg total) by mouth daily. Swallow whole. ?Start taking on: Oct 17, 2021 ?  ?atorvastatin 80 MG tablet ?Commonly known as: LIPITOR ?Take 1 tablet (80 mg total) by mouth daily. ?Start taking on: Oct 17, 2021 ?  ?digoxin 0.125 MG tablet ?Commonly known as: LANOXIN ?Take 1 tablet (0.125 mg total) by mouth daily. ?Start taking on: Oct 17, 2021 ?  ?esomeprazole 40 MG capsule ?Commonly known as: [place] ?Take 40 mg by mouth daily at 12 noon. ?  ?Farxiga 10 MG Tabs tablet ?Generic drug: dapagliflozin propanediol ?Take 1 tablet (10 mg total) by mouth daily. ?Start taking on: Oct 17, 2021 ?  ?furosemide 20 MG tablet ?Commonly known as: Lasix ?As needed for weight gain > 3lb in a day or 5 lb in a week ?  ?gabapentin 300 MG capsule ?Commonly known as: NEURONTIN ?Take 1 capsule (300 mg total) by mouth at bedtime. ?  ?latanoprost 0.005 % ophthalmic solution ?Commonly known as: XALATAN ?Place 1 drop into both eyes at bedtime. ?  ?losartan 25 MG tablet ?Commonly known as: COZAAR ?Take 0.5 tablets (12.5 mg total) by mouth at bedtime. ?  ?OVER THE COUNTER MEDICATION ?Apply 1 application. topically daily as  needed (For back pain). Medication: Magnilife Cream ?  ?potassium chloride SA 20 MEQ tablet ?Commonly known as: KLOR-CON M ?Take 1 tablet as needed when taking furosemide ?  ?spironolactone 25 MG tablet ?Commonly known a

## 2021-10-16 ENCOUNTER — Other Ambulatory Visit (HOSPITAL_COMMUNITY): Payer: Self-pay

## 2021-10-16 DIAGNOSIS — I340 Nonrheumatic mitral (valve) insufficiency: Secondary | ICD-10-CM | POA: Diagnosis not present

## 2021-10-16 DIAGNOSIS — I2111 ST elevation (STEMI) myocardial infarction involving right coronary artery: Secondary | ICD-10-CM | POA: Diagnosis not present

## 2021-10-16 DIAGNOSIS — I5021 Acute systolic (congestive) heart failure: Secondary | ICD-10-CM | POA: Diagnosis not present

## 2021-10-16 LAB — CBC
HCT: 34.8 % — ABNORMAL LOW (ref 39.0–52.0)
Hemoglobin: 11.9 g/dL — ABNORMAL LOW (ref 13.0–17.0)
MCH: 29.8 pg (ref 26.0–34.0)
MCHC: 34.2 g/dL (ref 30.0–36.0)
MCV: 87.2 fL (ref 80.0–100.0)
Platelets: 317 10*3/uL (ref 150–400)
RBC: 3.99 MIL/uL — ABNORMAL LOW (ref 4.22–5.81)
RDW: 13.9 % (ref 11.5–15.5)
WBC: 11.3 10*3/uL — ABNORMAL HIGH (ref 4.0–10.5)
nRBC: 0 % (ref 0.0–0.2)

## 2021-10-16 LAB — COOXEMETRY PANEL
Carboxyhemoglobin: 1.1 % (ref 0.5–1.5)
Methemoglobin: <0.7 % (ref 0.0–1.5)
O2 Saturation: 59 %
Total hemoglobin: 12.3 g/dL (ref 12.0–16.0)

## 2021-10-16 LAB — BASIC METABOLIC PANEL
Anion gap: 4 — ABNORMAL LOW (ref 5–15)
BUN: 34 mg/dL — ABNORMAL HIGH (ref 8–23)
CO2: 25 mmol/L (ref 22–32)
Calcium: 8.2 mg/dL — ABNORMAL LOW (ref 8.9–10.3)
Chloride: 107 mmol/L (ref 98–111)
Creatinine, Ser: 1.1 mg/dL (ref 0.61–1.24)
GFR, Estimated: >60 mL/min (ref >60)
Glucose, Bld: 120 mg/dL — ABNORMAL HIGH (ref 70–99)
Potassium: 4.4 mmol/L (ref 3.5–5.1)
Sodium: 136 mmol/L (ref 135–145)

## 2021-10-16 MED ORDER — ASPIRIN 81 MG PO TBEC
81.0000 mg | DELAYED_RELEASE_TABLET | Freq: Every day | ORAL | 1 refills | Status: DC
  Filled 2021-10-16: qty 30, 30d supply, fill #0

## 2021-10-16 MED ORDER — ATORVASTATIN CALCIUM 80 MG PO TABS
80.0000 mg | ORAL_TABLET | Freq: Every day | ORAL | 1 refills | Status: DC
  Filled 2021-10-16: qty 30, 30d supply, fill #0

## 2021-10-16 MED ORDER — FUROSEMIDE 20 MG PO TABS
ORAL_TABLET | ORAL | 1 refills | Status: DC
  Filled 2021-10-16: qty 30, 30d supply, fill #0

## 2021-10-16 MED ORDER — LOSARTAN POTASSIUM 25 MG PO TABS
12.5000 mg | ORAL_TABLET | Freq: Every day | ORAL | 1 refills | Status: DC
  Filled 2021-10-16: qty 15, 30d supply, fill #0

## 2021-10-16 MED ORDER — DIGOXIN 125 MCG PO TABS
0.1250 mg | ORAL_TABLET | Freq: Every day | ORAL | 1 refills | Status: DC
  Filled 2021-10-16: qty 30, 30d supply, fill #0

## 2021-10-16 MED ORDER — DAPAGLIFLOZIN PROPANEDIOL 10 MG PO TABS
10.0000 mg | ORAL_TABLET | Freq: Every day | ORAL | 1 refills | Status: DC
  Filled 2021-10-16: qty 30, 30d supply, fill #0

## 2021-10-16 MED ORDER — POTASSIUM CHLORIDE CRYS ER 20 MEQ PO TBCR
EXTENDED_RELEASE_TABLET | ORAL | 1 refills | Status: DC
  Filled 2021-10-16: qty 30, 30d supply, fill #0

## 2021-10-16 MED ORDER — SPIRONOLACTONE 25 MG PO TABS
25.0000 mg | ORAL_TABLET | Freq: Every day | ORAL | 1 refills | Status: DC
  Filled 2021-10-16: qty 30, 30d supply, fill #0

## 2021-10-16 NOTE — Care Management Important Message (Signed)
Important Message ? ?Patient Details  ?Name: Keith Jenkins ?MRN: [id number] ?Date of Birth: 26-Jun-1945 ? ? ?Medicare Important Message Given:  Yes ? ? ? ? ?[name] ?10/16/2021, 11:13 AM ?

## 2021-10-16 NOTE — Progress Notes (Signed)
Pt ambulated with PT this am, tolerated well per pt. Discussed with pt, wife, and daughter restrictions, daily wts, low sodium diet, walking for exercise, and NTG. Pt has IS and has been practicing, 1000 ml. Discussed sternal precautions and gave [facility] booklet and care guide. Receptive. N/a for CRPII until after revascularization.  ?[id number] ?Ethelda Chick CES, ACSM ?11:40 AM ?10/16/2021 ? ?

## 2021-10-16 NOTE — Progress Notes (Signed)
Physical Therapy Treatment ?Patient Details ?Name: Keith Jenkins ?MRN: [id number] ?DOB: 1946/06/05 ?Today's Date: 10/16/2021 ? ? ?History of Present Illness 76 yo admitted 5/4 with SOB and chest pain with late presentation STEMI. Cardiac cath 5/4. 5/8 TEE. PMhx: pre diabetes, HLD, GERD, arthritis, glaucoma ? ?  ?PT Comments  ? ? Pt very pleasant and eager [place]. Pt with improved transfers and gait with mild balance deficits and encouraged cane use for stability at D/C. Pt with decreased activity tolerance with education for walking program and progression to return to baseline.  ? ?HR 80-89 ?SpO2 90-98% on RA ?   ?Recommendations for follow up therapy are one component of a multi-disciplinary discharge planning process, led by the attending physician.  Recommendations may be updated based on patient status, additional functional criteria and insurance authorization. ? ?Follow Up Recommendations ? No PT follow up ?  ?  ?Assistance Recommended at Discharge PRN  ?Patient can return home with the following A little help with walking and/or transfers ?  ?Equipment Recommendations ? None recommended by PT  ?  ?Recommendations for Other Services   ? ? ?  ?Precautions / Restrictions Precautions ?Precautions: Fall  ?  ? ?Mobility ? Bed Mobility ?Overal bed mobility: Modified Independent ?Bed Mobility: Supine to Sit ?  ?  ?  ?  ?  ?General bed mobility comments: HOB 10 degrees without assist ?  ? ?Transfers ?Overall transfer level: Modified independent ?  ?  ?  ?  ?  ?  ?  ?  ?  ?  ? ?Ambulation/Gait ?Ambulation/Gait assistance: Supervision ?Gait Distance (Feet): 400 Feet ?Assistive device: None ?Gait Pattern/deviations: Step-through pattern, Decreased stride length ?  ?  ?  ?General Gait Details: pt with improved stability with gait without overt LOB with tendency to place hand on rail as available but also able to walk without UE support. Pt encouraged to use cane at home ? ? ?Stairs ?Stairs: Yes ?Stairs  assistance: Modified independent (Device/Increase time) ?Stair Management: Alternating pattern, Forwards, One rail Right ?Number of Stairs: 10 ?General stair comments: pt able to ascend/descend stairs with railing with good stability ? ? ?Wheelchair Mobility ?  ? ?Modified Rankin (Stroke Patients Only) ?  ? ? ?  ?Balance Overall balance assessment: Mild deficits observed, not formally tested ?Sitting-balance support: Feet supported, No upper extremity supported ?Sitting balance-Leahy Scale: Normal ?  ?  ?Standing balance support: No upper extremity supported, During functional activity ?Standing balance-Leahy Scale: Good ?Standing balance comment: Pt with good stability during gait with noted higher level challenge with head turns ?  ?  ?  ?  ?  ?  ?  ?  ?  ?  ?  ?  ? ?  ?Cognition Arousal/Alertness: Awake/alert ?Behavior During Therapy: [facility] for tasks assessed/performed ?Overall Cognitive Status: Within Functional Limits for tasks assessed ?  ?  ?  ?  ?  ?  ?  ?  ?  ?  ?  ?  ?  ?  ?  ?  ?  ?  ?  ? ?  ?Exercises   ? ?  ?General Comments   ?  ?  ? ?Pertinent Vitals/Pain Pain Assessment ?Pain Assessment: No/denies pain  ? ? ?Home Living   ?  ?  ?  ?  ?  ?  ?  ?  ?  ?   ?  ?Prior Function    ?  ?  ?   ? ?PT Goals (  current goals can now be found in the care plan section) Progress towards PT goals: Progressing toward goals ? ?  ?Frequency ? ? ?   ? ? ? ?  ?PT Plan Discharge plan needs to be updated  ? ? ?Co-evaluation   ?  ?  ?  ?  ? ?  ?AM-PAC PT "6 Clicks" Mobility   ?Outcome Measure ? Help needed turning from your back to your side while in a flat bed without using bedrails?: None ?Help needed moving from lying on your back to sitting on the side of a flat bed without using bedrails?: None ?Help needed moving to and from a bed to a chair (including a wheelchair)?: None ?Help needed standing up from a chair using your arms (e.g., wheelchair or bedside chair)?: None ?Help needed to walk in hospital room?: A Little ?Help  needed climbing 3-5 steps with a railing? : None ?6 Click Score: 23 ? ?  ?End of Session Equipment Utilized During Treatment: Gait belt ?Activity Tolerance: Patient tolerated treatment well ?Patient left: in chair;with call bell/phone within reach ?Nurse Communication: Mobility status ?PT Visit Diagnosis: Other abnormalities of gait and mobility (R26.89);Difficulty in walking, not elsewhere classified (R26.2) ?  ? ? ?Time: 4967-5916 ?PT Time Calculation (min) (ACUTE ONLY): 28 min ? ?Charges:  $Gait Training: 8-22 mins ?$Therapeutic Activity: 8-22 mins          ?          ? ?Yolette Hastings P, PT ?Acute Rehabilitation Services ?Pager: [phone] ?Office: [phone] ? ? ? ?Faigy Stretch B Harrie Cazarez ?10/16/2021, 9:24 AM ? ?

## 2021-10-16 NOTE — Progress Notes (Addendum)
? ? ?Advanced Heart Failure Rounding Note ? ? ?Subjective:   ? ?TEE - EF 25-30%, posterior leaflet MV restricted, 2-3+ MR, mild to moderate AI, RV mildly reduced. ? ?CO-OX 59% ? ?CVP 3-4 ? ?Dyspnea improving. No CP. Has been up ambulating in the halls. ? ? ? ?Objective:   ?Weight Range: 226>>217 lb ? ?Vital Signs:   ?Temp:  [98.1 ?F (36.7 ?C)-98.4 ?F (36.9 ?C)] 98.1 ?F (36.7 ?C) (05/10 0458) ?Pulse Rate:  [76-91] 76 (05/10 0458) ?Resp:  [16-20] 16 (05/10 0458) ?BP: (95-108)/(65-79) 95/65 (05/10 0458) ?SpO2:  [95 %-99 %] 99 % (05/10 0833) ?Last BM Date : 10/15/21 ? ?Weight change: ?Filed Weights  ? 10/12/21 0500 10/13/21 0338 10/14/21 0415  ?Weight: 98.8 kg 98.7 kg 98.5 kg  ? ? ?Intake/Output:  ? ?Intake/Output Summary (Last 24 hours) at 10/16/2021 0948 ?Last data filed at 10/16/2021 0157 ?Gross per 24 hour  ?Intake --  ?Output 550 ml  ?Net -550 ml  ?  ? ?PHYSICAL EXAM: ?General:  No distress. Sitting up in chair. Wife and daughter present. ?HEENT: normal ?Neck: supple. no JVD. Carotids 2+ bilat; no bruits.  ?Cor: PMI nondisplaced. Regular rate & rhythm. No rubs, gallops or murmurs. ?Lungs: clear ?Abdomen: soft, nontender, nondistended. No hepatosplenomegaly. ?Extremities: no cyanosis, clubbing, rash, edema, + RUE PICC ?Neuro: alert & orientedx3, cranial nerves grossly intact. moves all 4 extremities w/o difficulty. Affect pleasant ? ? ?Telemetry: SR 80s, 1-3 PVCs/min (personally reviewed) ? ?Labs: ?Basic Metabolic Panel: ?Recent Labs  ?Lab 10/12/21 ?1201 10/13/21 ?0054 10/13/21 ?1140 10/14/21 ?0750 10/15/21 ?0500 10/16/21 ?0420  ?NA 135 136  --  136 137 136  ?K 4.1 4.1  --  4.2 4.1 4.4  ?CL 106 107  --  105 109 107  ?CO2 21* 20*  --  24 22 25   ?GLUCOSE 144* 123*  --  114* 116* 120*  ?BUN 34* 32*  --  33* 34* 34*  ?CREATININE 1.13 1.08 1.14 1.09 1.09 1.10  ?CALCIUM 8.4* 8.4*  --  8.0* 7.6* 8.2*  ? ? ?Liver Function Tests: ?Recent Labs  ?Lab 10/10/21 ?1606  ?AST 98*  ?ALT 47*  ?ALKPHOS 51  ?BILITOT 1.0  ?PROT 6.6   ?ALBUMIN 2.8*  ? ?No results for input(s): LIPASE, AMYLASE in the last 168 hours. ?No results for input(s): AMMONIA in the last 168 hours. ? ?CBC: ?Recent Labs  ?Lab 10/13/21 ?0054 10/13/21 ?1140 10/14/21 ?0750 10/15/21 ?0500 10/16/21 ?0420  ?WBC 11.4* 11.8* 10.9* 11.0* 11.3*  ?HGB 12.6* 12.2* 11.9* 12.3* 11.9*  ?HCT 36.1* 35.7* 33.5* 34.7* 34.8*  ?MCV 86.6 87.5 86.8 88.1 87.2  ?PLT 298 286 281 298 317  ? ? ?Cardiac Enzymes: ?No results for input(s): CKTOTAL, CKMB, CKMBINDEX, TROPONINI in the last 168 hours. ? ?BNP: ?BNP (last 3 results) ?Recent Labs  ?  10/10/21 ?1606  ?BNP 1,564.6*  ? ? ?ProBNP (last 3 results) ?No results for input(s): PROBNP in the last 8760 hours. ? ? ? ?Other results: ? ?Imaging: ?No results found. ? ? ?Medications:   ? ? ?Scheduled Medications: ? aspirin EC  81 mg Oral Daily  ? atorvastatin  80 mg Oral Daily  ? Chlorhexidine Gluconate Cloth  6 each Topical Daily  ? dapagliflozin propanediol  10 mg Oral Daily  ? digoxin  0.125 mg Oral Daily  ? enoxaparin (LOVENOX) injection  40 mg Subcutaneous Q24H  ? feeding supplement  237 mL Oral BID BM  ? fluticasone furoate-vilanterol  1 puff Inhalation Daily  ? And  ?  umeclidinium bromide  1 puff Inhalation Daily  ? gabapentin  300 mg Oral QHS  ? latanoprost  1 drop Both Eyes QHS  ? losartan  12.5 mg Oral QHS  ? pantoprazole  40 mg Oral Daily  ? sodium chloride flush  10-40 mL Intracatheter Q12H  ? sodium chloride flush  3 mL Intravenous Q12H  ? spironolactone  25 mg Oral Daily  ? ? ?Infusions: ? sodium chloride    ? ? ?PRN Medications: ?sodium chloride, acetaminophen, albuterol, guaiFENesin-dextromethorphan, melatonin, ondansetron (ZOFRAN) IV, sodium chloride flush, sodium chloride flush ? ? ?Assessment/Plan:  ? ?1. CAD with late presenting Inferior STEMI  ?-LHC 05/04: 100% m RCA and 90% m LAD.  ?-RCA managed medically d/t completed infarct.  ?-Echo EF 25%, RV moderately reduced, moderate to severe MR ?-No s/s angina.  ?-Initial plan for PCI to LAD but  with moderate to severe ischemic MR I think better option is CABG/MVR with LIMA to LAD. If not candidate can consider PCI LAD with mTEER. Films reviewed with Dr. Excell Seltzer. LAD not ideal for PCI. Dr. Laneta Simmers planning for surgery in next 3-4 weeks once he has time to recover post MI.  ?-Will follow-up in clinic next week, anticipate 2-3 visits prior to surgery. Will need repeat echo in about 3 weeks. ?-He got 2 doses of Plavix (last dose 5/6). ?-Continue ASA/statin ?  ?2. New systolic CHF/ICM ?- In setting of late presentation inferior STEMI ?-Echo EF 25-30%, RV moderately reduced, moderate to severe MR ?-Symptomatically much improved with GDMT initiation and diuresis ?-Co-ox 59%.  ?- CVP 3-4. Will prescribe PRN diuretic at discharge ?-Continue digoxin 0.125 ?-Continue spiro 25 mg daily. ?-Continue farxiga 10 mg daily ?-Continue losartan 12.5 daily ?-Renal function stable. No BP remove to titrate GDMT ? ?3. Mitral regurgitation ?-Moderate to severe ischemic MR on TEE.  ?-Discussed with TCTS as above. Plan for MVR at later date ? ?4. Aortic valve insufficiency ?-Mild to moderate AI ?-Will need to follow ?  ?5. Possible AKI ?-Scr baseline 0.8-0.9, this admit 1.21>1.29>1.09>1.10 ?  ?5. Prediabetes ?-A1c 5.8 01/23 ?-Farxiga as above ? ?6. Physical deconditioning ?- PT/OT has seen, does not require referral at discharge. ? ? ?Okay for discharge later today.  ? ? ?Length of Stay: 6 ? ? ?FINCH, LINDSAY N NP-C  ?10/16/2021, 9:48 AM ? ?Advanced Heart Failure Team ?Pager [phone] (M-F; 7a - 4p)  ?Please contact [facility] for night-coverage after hours (4p -7a ) and weekends on [email or web address] ? ?Patient seen and examined with the above-signed Advanced Practice Provider and/or Housestaff. I personally reviewed laboratory data, imaging studies and relevant notes. I independently examined the patient and formulated the important aspects of the plan. I have edited the note to reflect any of my changes or salient points. I have  personally discussed the plan with the patient and/or family. ? ?Feels good. Denies SOB, orthopnea or PND. [employer]. ? ?General:  Sitting in chair No resp difficulty ?HEENT: normal ?Neck: supple. no JVD. Carotids 2+ bilat; no bruits. No lymphadenopathy or thryomegaly appreciated. ?Cor: PMI nondisplaced. Regular rate & rhythm. No rubs, gallops or murmurs. ?Lungs: clear ?Abdomen: soft, nontender, nondistended. No hepatosplenomegaly. No bruits or masses. Good bowel sounds. ?Extremities: no cyanosis, clubbing, rash, edema ?Neuro: alert & orientedx3, cranial nerves grossly intact. moves all 4 extremities w/o difficulty. Affect pleasant ? ?He is ok for d/c today. D/w Dr. Laneta Simmers will follow in clinic for 3-4 weeks and titrate meds. Encouraged him to rehab at home. Hopefully will be  dstrong enough for CABG/MVR soon.  ? ?Arvilla Meres, MD  ?11:39 PM ? ? ? ?

## 2021-10-18 ENCOUNTER — Telehealth (HOSPITAL_COMMUNITY): Payer: Self-pay | Admitting: *Deleted

## 2021-10-18 NOTE — Telephone Encounter (Signed)
Received VM from caregiver that pt was supposed to be prescribed nitroglycerin at discharge but it was not sent in. I do not see nitro on pts med list. ? ?Routed to Dr.Bensimhon to see if it is ok to prescribe nitroglycerin for patient.  ?

## 2021-10-21 ENCOUNTER — Other Ambulatory Visit (HOSPITAL_COMMUNITY): Payer: Self-pay | Admitting: *Deleted

## 2021-10-21 MED ORDER — NITROGLYCERIN 0.4 MG SL SUBL: 0.4000 mg | SUBLINGUAL_TABLET | SUBLINGUAL | 3 refills | Status: DC | PRN

## 2021-10-21 NOTE — Progress Notes (Addendum)
? ?ADVANCED HF CLINIC CONSULT NOTE ? ? ?Primary Care: [name], NP ?HF Cardiologist: Dr. Gala Romney ? ?HPI: ?Keith Jenkins is a 76 y.o. male with history of COPD, prediabetes, GERD/dysphagia, HLD, and new diagnosis of CAD and systolic heart failure. ? ?Admitted 4/23 with chest pain. Ruled in for inferior STEMI, late presenting. LHC showed total occlusion mRCA and 90% mLAD. RCA managed medically d/t completed infarct.  Plan to optimize medical therapy and arrange LAD intervention at later time. Echo showed EF 20-255%, RWMA, RV moderately reduced, mioderate to severe ischemic MR, aortic root 4.3 cm, dilated IVC with estimated RAP 8 mmHg. AHF consulted and GDMT titrated, but limited due to soft blood pressure. CVTS consulted for CABG/MVR, instead of PCI to LAD, due to moderate to severe ischemic MR. Plan to optimize medically and improve strength, then arrange surgery in a few weeks. Discharge home, weight 217 lbs. ?  ?Today he returns for HF follow up with his wife and daughter. Overall feeling fine but remains weak. Walking daily 10 minutes at a time up and down hills without SOB. He has new orthopnea. Denies palpitations, CP, dizziness, edema, or abnormal bleeding. Appetite ok. No fever or chills. Weight at home 211 pounds. Taking all medications. Has not need PRN lasix. He has a productive cough since hospitalization, daughter says he is depressed. [place] in [place]. ? ?Cardiac Studies: ?- Echo (4/23): EF 20-255%, RWMA, RV moderately reduced, mioderate to severe ischemic MR, aortic root 4.3 cm, dilated IVC with estimated RAP 8 mmHg ? ?- LHC (5/23): 100% m RCA and 90% m LAD. RCA managed medically d/t completed infarct.  ? ?Review of Systems: [y] = yes, [ ]  = no  ? ?General: Weight gain [ ] ; Weight loss [ ] ; Anorexia [ ] ; Fatigue [ y]; Fever [ ] ; Chills [ ] ; Weakness [name] ]  ?Cardiac: Chest pain/pressure [ ] ; Resting SOB [ ] ; Exertional SOB [ ] ; Orthopnea [ ] ; Pedal Edema [ ] ; Palpitations [ ] ;  Syncope [ ] ; Presyncope [ ] ; Paroxysmal nocturnal dyspnea[ ]   ?Pulmonary: Cough [ ] ; Wheezing[ ] ; Hemoptysis[ ] ; Sputum [ ] ; Snoring [name] ]  ?GI: Vomiting[ ] ; Dysphagia[ ] ; Melena[ ] ; Hematochezia [ ] ; Heartburn[ ] ; Abdominal pain [ ] ; Constipation [ ] ; Diarrhea [ ] ; BRBPR [ ]   ?GU: Hematuria[ ] ; Dysuria [ ] ; Nocturia[ ]   ?Vascular: Pain in legs with walking [ ] ; Pain in feet with lying flat [ ] ; Non-healing sores [ ] ; Stroke [ ] ; TIA [ ] ; Slurred speech [ ] ;  ?Neuro: Headaches[ ] ; Vertigo[ ] ; Seizures[ ] ; Paresthesias[ ] ;Blurred vision [ ] ; Diplopia [ ] ; Vision changes [ ]   ?Ortho/Skin: Arthritis [ ] ; Joint pain [ ] ; Muscle pain [ ] ; Joint swelling [ ] ; Back Pain [ ] ; Rash [ ]   ?Psych: Depression[ ] ; Anxiety[ ]   ?Heme: Bleeding problems [ ] ; Clotting disorders [ ] ; Anemia [ ]   ?Endocrine: Diabetes [ ] ; Thyroid dysfunction[ ]  ? ?Past Medical History:  ?Diagnosis Date  ? Arthritis   ? Glaucoma   ? ?Current Outpatient Medications  ?Medication Sig Dispense Refill  ? albuterol (VENTOLIN HFA) 108 (90 Base) MCG/ACT inhaler Inhale 2 puffs into the lungs every 6 (six) hours as needed for wheezing or shortness of breath. 8 g 0  ? aspirin 81 MG EC tablet Take 1 tablet (81 mg total) by mouth daily. Swallow whole. 30 tablet 1  ? atorvastatin (LIPITOR) 80 MG tablet Take 1 tablet (80 mg total) by mouth daily. 30  tablet 1  ? dapagliflozin propanediol (FARXIGA) 10 MG TABS tablet Take 1 tablet (10 mg total) by mouth daily. 30 tablet 1  ? digoxin (LANOXIN) 0.125 MG tablet Take 1 tablet (0.125 mg total) by mouth daily. 30 tablet 1  ? esomeprazole (NEXIUM) 40 MG capsule Take 40 mg by mouth daily at 12 noon.    ? Fluticasone-Umeclidin-Vilant (TRELEGY ELLIPTA) 100-62.5-25 MCG/ACT AEPB Inhale 1 puff into the lungs daily. 2 each 0  ? furosemide (LASIX) 20 MG tablet As needed for weight gain > 3lb in a day or 5 lb in a week 30 tablet 1  ? gabapentin (NEURONTIN) 300 MG capsule Take 1 capsule (300 mg total) by mouth at bedtime. 90 capsule 0  ?  latanoprost (XALATAN) 0.005 % ophthalmic solution Place 1 drop into both eyes at bedtime.    ? losartan (COZAAR) 25 MG tablet Take 0.5 tablets (12.5 mg total) by mouth at bedtime. 15 tablet 1  ? nitroGLYCERIN (NITROSTAT) 0.4 MG SL tablet Place 1 tablet (0.4 mg total) under the tongue every 5 (five) minutes as needed for chest pain. 25 tablet 3  ? OVER THE COUNTER MEDICATION Apply 1 application. topically daily as needed (For back pain). Medication: Magnilife Cream    ? potassium chloride SA (KLOR-CON M) 20 MEQ tablet Take 1 tablet as needed when taking furosemide 30 tablet 1  ? spironolactone (ALDACTONE) 25 MG tablet Take 1 tablet (25 mg total) by mouth daily. 30 tablet 1  ? ?No current facility-administered medications for this encounter.  ? ?No Known Allergies ? ?Social History  ? ?Socioeconomic History  ? Marital status: Married  ?  Spouse name: Not on file  ? Number of children: Not on file  ? Years of education: Not on file  ? Highest education level: Not on file  ?Occupational History  ? Not on file  ?Tobacco Use  ? Smoking status: Former  ?  Types: Cigarettes  ?  Quit date: 1970  ?  Years since quitting: 53.4  ? Smokeless tobacco: Never  ?Substance and Sexual Activity  ? Alcohol use: Not Currently  ? Drug use: Never  ? Sexual activity: Not on file  ?Other Topics Concern  ? Not on file  ?Social History Narrative  ? Not on file  ? ?Social Determinants of Health  ? ?Financial Resource Strain: Not on file  ?Food Insecurity: Not on file  ?Transportation Needs: Not on file  ?Physical Activity: Not on file  ?Stress: Not on file  ?Social Connections: Not on file  ?Intimate Partner Violence: Not on file  ? ?Family History  ?Problem Relation Age of Onset  ? Heart attack Mother   ? Hypertension Mother   ? Heart attack Father   ? Hypertension Father   ? Stomach cancer Sister   ? Brain cancer Sister   ? ?BP 100/74   Pulse 88   Wt 99.8 kg (220 lb)   SpO2 95%   BMI 29.03 kg/m?  ? ?Wt Readings from Last 3 Encounters:   ?10/22/21 99.8 kg (220 lb)  ?10/14/21 98.5 kg (217 lb 1.6 oz)  ?10/09/21 102.5 kg (226 lb)  ? ?PHYSICAL EXAM: ?General:  NAD. No resp difficulty, walked into clinic with cane. ?HEENT: Normal ?Neck: Supple. JVP 7-8. Carotids 2+ bilat; no bruits. No lymphadenopathy or thryomegaly appreciated. ?Cor: PMI nondisplaced. Regular rate & rhythm. No rubs, gallops or murmurs. ?Lungs: Clear ?Abdomen: Soft, nontender, nondistended. No hepatosplenomegaly. No bruits or masses. Good bowel sounds. ?Extremities: No cyanosis, clubbing,  rash, edema ?Neuro: Alert & oriented x 3, cranial nerves grossly intact. Moves all 4 extremities w/o difficulty. Affect pleasant. ? ?ECG: NSR with PVCs, 91 bpm (personally reviewed). ? ?ReDs: 44% ? ?ASSESSMENT & PLAN: ?1. CAD ?- LHC 05/04: 100% m RCA and 90% m LAD.  ?- RCA managed medically d/t completed infarct.  ?- Echo (5/23) EF 25%, RV moderately reduced, moderate to severe MR ?- Initial plan for PCI to LAD but with moderate to severe ischemic MR I think better option is CABG/MVR with LIMA to LAD. If not candidate can consider PCI LAD with mTEER. Films reviewed with Dr. Excell Seltzer. LAD not ideal for PCI. Dr. Laneta Simmers planning for surgery in next 3-4 weeks, once he has time to recover post MI.  ?- No chest pain. ?- Will need repeat echo in about 3 weeks. ?- He got 2 doses of Plavix (last dose 5/6). ?- Continue ASA/statin. ?  ?2. New systolic CHF/ICM ?- In setting of late presentation inferior STEMI ?- Echo EF 25-30%, RV moderately reduced, moderate to severe MR. ?- NYHA II-early III, appears mildly volume up on exam, REDs 44%. ?- Restart Lasix 20 mg daily + 20 KCL daily. ?- Continue digoxin 0.125 mg daily. ?- Continue spiro 25 mg daily. ?- Continue Farxiga 10 mg daily ?- Continue losartan 12.5 daily. No BP room to increase today.  ?- BMET, BNP and dig level today. ?  ?3. Mitral regurgitation ?- Moderate to severe ischemic MR on TEE.  ?- Discussed with TCTS as above. Plan for MVR at later date. ?  ?4.  Aortic valve insufficiency ?- Mild to moderate AI ?- Will need to follow. ?  ?5. H/o AKI ?- Labs today. ?  ?5. Prediabetes ?- A1c 5.8 1/23 ?- Continue Farxiga  ?  ?6. Physical deconditioning ?- Continue daily walks, I

## 2021-10-22 ENCOUNTER — Encounter (HOSPITAL_COMMUNITY): Payer: Self-pay

## 2021-10-22 ENCOUNTER — Ambulatory Visit (HOSPITAL_BASED_OUTPATIENT_CLINIC_OR_DEPARTMENT_OTHER)
Admit: 2021-10-22 | Discharge: 2021-10-22 | Disposition: A | Discharge status: Home / Self Care | Payer: Medicare HMO | Attending: Family Medicine | Admitting: Family Medicine

## 2021-10-22 VITALS — BP 100/74 | HR 88 | Wt 220.0 lb

## 2021-10-22 DIAGNOSIS — E785 Hyperlipidemia, unspecified: Secondary | ICD-10-CM | POA: Insufficient documentation

## 2021-10-22 DIAGNOSIS — I351 Nonrheumatic aortic (valve) insufficiency: Secondary | ICD-10-CM

## 2021-10-22 DIAGNOSIS — E871 Hypo-osmolality and hyponatremia: Secondary | ICD-10-CM | POA: Diagnosis not present

## 2021-10-22 DIAGNOSIS — F32A Depression, unspecified: Secondary | ICD-10-CM

## 2021-10-22 DIAGNOSIS — J449 Chronic obstructive pulmonary disease, unspecified: Secondary | ICD-10-CM | POA: Insufficient documentation

## 2021-10-22 DIAGNOSIS — E119 Type 2 diabetes mellitus without complications: Secondary | ICD-10-CM | POA: Diagnosis not present

## 2021-10-22 DIAGNOSIS — R058 Other specified cough: Secondary | ICD-10-CM | POA: Insufficient documentation

## 2021-10-22 DIAGNOSIS — I340 Nonrheumatic mitral (valve) insufficiency: Secondary | ICD-10-CM

## 2021-10-22 DIAGNOSIS — I080 Rheumatic disorders of both mitral and aortic valves: Secondary | ICD-10-CM | POA: Insufficient documentation

## 2021-10-22 DIAGNOSIS — I252 Old myocardial infarction: Secondary | ICD-10-CM | POA: Insufficient documentation

## 2021-10-22 DIAGNOSIS — I5022 Chronic systolic (congestive) heart failure: Secondary | ICD-10-CM

## 2021-10-22 DIAGNOSIS — I5020 Unspecified systolic (congestive) heart failure: Secondary | ICD-10-CM | POA: Insufficient documentation

## 2021-10-22 DIAGNOSIS — I5023 Acute on chronic systolic (congestive) heart failure: Secondary | ICD-10-CM | POA: Diagnosis not present

## 2021-10-22 DIAGNOSIS — R69 Illness, unspecified: Secondary | ICD-10-CM | POA: Diagnosis not present

## 2021-10-22 DIAGNOSIS — I2510 Atherosclerotic heart disease of native coronary artery without angina pectoris: Secondary | ICD-10-CM | POA: Insufficient documentation

## 2021-10-22 DIAGNOSIS — Z8673 Personal history of transient ischemic attack (TIA), and cerebral infarction without residual deficits: Secondary | ICD-10-CM | POA: Insufficient documentation

## 2021-10-22 DIAGNOSIS — I2119 ST elevation (STEMI) myocardial infarction involving other coronary artery of inferior wall: Secondary | ICD-10-CM | POA: Diagnosis not present

## 2021-10-22 DIAGNOSIS — R7303 Prediabetes: Secondary | ICD-10-CM

## 2021-10-22 DIAGNOSIS — K219 Gastro-esophageal reflux disease without esophagitis: Secondary | ICD-10-CM | POA: Insufficient documentation

## 2021-10-22 DIAGNOSIS — Z79899 Other long term (current) drug therapy: Secondary | ICD-10-CM | POA: Insufficient documentation

## 2021-10-22 DIAGNOSIS — I4892 Unspecified atrial flutter: Secondary | ICD-10-CM | POA: Diagnosis not present

## 2021-10-22 DIAGNOSIS — K567 Ileus, unspecified: Secondary | ICD-10-CM | POA: Diagnosis not present

## 2021-10-22 DIAGNOSIS — R531 Weakness: Secondary | ICD-10-CM | POA: Insufficient documentation

## 2021-10-22 DIAGNOSIS — R5381 Other malaise: Secondary | ICD-10-CM

## 2021-10-22 DIAGNOSIS — Z20822 Contact with and (suspected) exposure to covid-19: Secondary | ICD-10-CM | POA: Diagnosis not present

## 2021-10-22 DIAGNOSIS — I309 Acute pericarditis, unspecified: Secondary | ICD-10-CM | POA: Diagnosis not present

## 2021-10-22 DIAGNOSIS — D62 Acute posthemorrhagic anemia: Secondary | ICD-10-CM | POA: Diagnosis not present

## 2021-10-22 LAB — BASIC METABOLIC PANEL
Anion gap: 9 (ref 5–15)
BUN: 20 mg/dL (ref 8–23)
CO2: 21 mmol/L — ABNORMAL LOW (ref 22–32)
Calcium: 8.3 mg/dL — ABNORMAL LOW (ref 8.9–10.3)
Chloride: 102 mmol/L (ref 98–111)
Creatinine, Ser: 1 mg/dL (ref 0.61–1.24)
GFR, Estimated: >60 mL/min (ref >60)
Glucose, Bld: 112 mg/dL — ABNORMAL HIGH (ref 70–99)
Potassium: 4.3 mmol/L (ref 3.5–5.1)
Sodium: 132 mmol/L — ABNORMAL LOW (ref 135–145)

## 2021-10-22 LAB — DIGOXIN LEVEL: Digoxin Level: 0.7 ng/mL — ABNORMAL LOW (ref 0.8–2.0)

## 2021-10-22 LAB — BRAIN NATRIURETIC PEPTIDE: B Natriuretic Peptide: 1226.7 pg/mL — ABNORMAL HIGH (ref 0.0–100.0)

## 2021-10-22 MED ORDER — FUROSEMIDE 20 MG PO TABS
20.0000 mg | ORAL_TABLET | Freq: Every day | ORAL | 6 refills | Status: DC
Start: 2021-10-22 — End: 2021-11-18

## 2021-10-22 MED ORDER — POTASSIUM CHLORIDE CRYS ER 20 MEQ PO TBCR: 20.0000 meq | EXTENDED_RELEASE_TABLET | Freq: Every day | ORAL | 5 refills | Status: DC

## 2021-10-22 NOTE — Patient Instructions (Signed)
Thank you for coming in today ? ?Labs were done today, if any labs are abnormal the clinic will call you ?No news is good news ? ?Your physician recommends that you schedule a follow-up appointment in:  ?2-3 weeks  ? ?At the [facility], you and your health needs are our priority. As part of our continuing mission to provide you with exceptional heart care, we have created designated Provider Care Teams. These Care Teams include your primary Cardiologist (physician) and Advanced Practice Providers (APPs- Physician Assistants and Nurse Practitioners) who all work together to provide you with the care you need, when you need it.  ? ?You may see any of the following providers on your designated Care Team at your next follow up: ?Dr Arvilla Meres ?Dr Marca Ancona ?Tonye Becket, NP ?Robbie Lis, PA ?Jessica Milford,NP ?Anna Genre, PA ?Karle Plumber, PharmD ? ? ?Please be sure to bring in all your medications bottles to every appointment.  ? ?If you have any questions or concerns before your next appointment please send [place] a message through [place] or call our office at [phone].   ? ?TO LEAVE A MESSAGE FOR THE NURSE SELECT OPTION 2, PLEASE LEAVE A MESSAGE INCLUDING: ?YOUR NAME ?DATE OF BIRTH ?CALL BACK NUMBER ?REASON FOR CALL**this is important as we prioritize the call backs ? ?YOU WILL RECEIVE A CALL BACK THE SAME DAY AS LONG AS YOU CALL BEFORE 4:00 PM ? ?

## 2021-10-22 NOTE — Addendum Note (Signed)
Encounter addended by: Jacklynn Ganong, FNP on: 10/22/2021 4:49 PM ? Actions taken: Clinical Note Signed

## 2021-10-22 NOTE — Addendum Note (Signed)
Encounter addended by: Jacklynn Ganong, FNP on: 10/22/2021 4:56 PM ? Actions taken: Clinical Note Signed

## 2021-10-22 NOTE — Progress Notes (Signed)
ReDS Vest / Clip - 10/22/21 1600   ? ?  ? ReDS Vest / Clip  ? Station Marker C   ? Ruler Value 30   ? ReDS Value Range High volume overload   ? ReDS Actual Value 44   ? ?  ?  ? ?  ? ? ?

## 2021-10-23 ENCOUNTER — Ambulatory Visit (INDEPENDENT_AMBULATORY_CARE_PROVIDER_SITE_OTHER): Payer: Medicare HMO | Admitting: Nurse Practitioner

## 2021-10-23 ENCOUNTER — Encounter (HOSPITAL_COMMUNITY): Payer: Self-pay

## 2021-10-23 ENCOUNTER — Encounter: Payer: Self-pay | Admitting: Nurse Practitioner

## 2021-10-23 VITALS — BP 90/62 | HR 124 | Temp 97.6°F | Ht 73.0 in | Wt 220.0 lb

## 2021-10-23 DIAGNOSIS — R7303 Prediabetes: Secondary | ICD-10-CM

## 2021-10-23 DIAGNOSIS — R079 Chest pain, unspecified: Secondary | ICD-10-CM | POA: Diagnosis not present

## 2021-10-23 DIAGNOSIS — I5021 Acute systolic (congestive) heart failure: Secondary | ICD-10-CM

## 2021-10-23 DIAGNOSIS — I712 Thoracic aortic aneurysm, without rupture, unspecified: Secondary | ICD-10-CM | POA: Diagnosis not present

## 2021-10-23 DIAGNOSIS — M549 Dorsalgia, unspecified: Secondary | ICD-10-CM | POA: Diagnosis not present

## 2021-10-23 DIAGNOSIS — I509 Heart failure, unspecified: Secondary | ICD-10-CM | POA: Diagnosis not present

## 2021-10-23 DIAGNOSIS — I214 Non-ST elevation (NSTEMI) myocardial infarction: Secondary | ICD-10-CM | POA: Diagnosis not present

## 2021-10-23 DIAGNOSIS — I700 Atherosclerosis of aorta: Secondary | ICD-10-CM

## 2021-10-23 DIAGNOSIS — K219 Gastro-esophageal reflux disease without esophagitis: Secondary | ICD-10-CM | POA: Diagnosis not present

## 2021-10-23 DIAGNOSIS — J449 Chronic obstructive pulmonary disease, unspecified: Secondary | ICD-10-CM | POA: Diagnosis not present

## 2021-10-23 DIAGNOSIS — Z87891 Personal history of nicotine dependence: Secondary | ICD-10-CM | POA: Diagnosis not present

## 2021-10-23 DIAGNOSIS — M546 Pain in thoracic spine: Secondary | ICD-10-CM | POA: Diagnosis not present

## 2021-10-23 DIAGNOSIS — R69 Illness, unspecified: Secondary | ICD-10-CM | POA: Diagnosis not present

## 2021-10-23 DIAGNOSIS — I2510 Atherosclerotic heart disease of native coronary artery without angina pectoris: Secondary | ICD-10-CM | POA: Diagnosis not present

## 2021-10-23 DIAGNOSIS — I2119 ST elevation (STEMI) myocardial infarction involving other coronary artery of inferior wall: Secondary | ICD-10-CM

## 2021-10-23 DIAGNOSIS — J90 Pleural effusion, not elsewhere classified: Secondary | ICD-10-CM | POA: Diagnosis not present

## 2021-10-23 DIAGNOSIS — F320 Major depressive disorder, single episode, mild: Secondary | ICD-10-CM | POA: Diagnosis not present

## 2021-10-23 DIAGNOSIS — I110 Hypertensive heart disease with heart failure: Secondary | ICD-10-CM | POA: Diagnosis not present

## 2021-10-23 DIAGNOSIS — K429 Umbilical hernia without obstruction or gangrene: Secondary | ICD-10-CM | POA: Diagnosis not present

## 2021-10-23 DIAGNOSIS — J811 Chronic pulmonary edema: Secondary | ICD-10-CM | POA: Diagnosis not present

## 2021-10-23 DIAGNOSIS — I340 Nonrheumatic mitral (valve) insufficiency: Secondary | ICD-10-CM

## 2021-10-23 DIAGNOSIS — R0602 Shortness of breath: Secondary | ICD-10-CM | POA: Diagnosis not present

## 2021-10-23 DIAGNOSIS — Z01818 Encounter for other preprocedural examination: Secondary | ICD-10-CM | POA: Diagnosis not present

## 2021-10-23 MED ORDER — ESCITALOPRAM OXALATE 10 MG PO TABS: 10.0000 mg | ORAL_TABLET | Freq: Every day | ORAL | 0 refills | Status: DC

## 2021-10-23 NOTE — Patient Instructions (Addendum)
Continue low-salt diet ?Continue medications ?Begin Lexapro 10 mg daily ?Notify office immediately of any adverse side effects ?Increase fluid and food intake ?Follow-up with cardiology as scheduled ?Follow-up in weeks ?Seek emergency medical care for any severe shortness of breath or chest pain ? ? ? ? ? ? ?Managing Depression, Adult ?Depression is a mental health condition that affects your thoughts, feelings, and actions. Being diagnosed with depression can bring you relief if you did not know why you have felt or behaved a certain way. It could also leave you feeling overwhelmed with uncertainty about your future. Preparing yourself to manage your symptoms can help you feel more positive about your future. ?How to manage lifestyle changes ?Managing stress ? ?Stress is your body's reaction to life changes and events, both good and bad. Stress can add to your feelings of depression. Learning to manage your stress can help lessen your feelings of depression. ?Try some of the following approaches to reducing your stress (stress reduction techniques): ?Listen to music that you enjoy and that inspires you. ?Try using a meditation app or take a meditation class. ?Develop a practice that helps you connect with your spiritual self. Walk in nature, pray, or go to a place of worship. ?Do some deep breathing. To do this, inhale slowly through your nose. Pause at the top of your inhale for a few seconds and then exhale slowly, letting your muscles relax. ?Practice yoga to help relax and work your muscles. ?Choose a stress reduction technique that suits your lifestyle and personality. These techniques take time and practice to develop. Set aside 5-15 minutes a day to do them. Therapists can offer training in these techniques. Other things you can do to manage stress include: ?Keeping a stress diary. ?Knowing your limits and saying no when you think something is too much. ?Paying attention to how you react to certain  situations. You may not be able to control everything, but you can change your reaction. ?Adding humor to your life by watching funny films or TV shows. ?Making time for activities that you enjoy and that relax you. ? ?Medicines ?Medicines, such as antidepressants, are often a part of treatment for depression. ?Talk with your pharmacist or health care provider about all the medicines, supplements, and herbal products that you take, their possible side effects, and what medicines and other products are safe to take together. ?Make sure to report any side effects you may have to your health care provider. ?Relationships ?Your health care provider may suggest family therapy, couples therapy, or individual therapy as part of your treatment. ?How to recognize changes ?Everyone responds differently to treatment for depression. As you recover from depression, you may start to: ?Have more interest in doing activities. ?Feel less hopeless. ?Have more energy. ?Overeat less often, or have a better appetite. ?Have better mental focus. ?It is important to recognize if your depression is not getting better or is getting worse. The symptoms you had in the beginning may return, such as: ?Tiredness (fatigue) or low energy. ?Eating too much or too little. ?Sleeping too much or too little. ?Feeling restless, agitated, or hopeless. ?Trouble focusing or making decisions. ?Unexplained physical complaints. ?Feeling irritable, angry, or aggressive. ?If you or your family members notice these symptoms coming back, let your health care provider know right away. ?Follow these instructions at home: ?Activity ? ?Try to get some form of exercise each day, such as walking, biking, swimming, or lifting weights. ?Practice stress reduction techniques. ?Engage your mind by taking  a class or doing some volunteer work. ?Lifestyle ?Get the right amount and quality of sleep. ?Cut down on using caffeine, tobacco, alcohol, and other potentially harmful  substances. ?Eat a healthy diet that includes plenty of vegetables, fruits, whole grains, low-fat dairy products, and lean protein. Do not eat a lot of foods that are high in solid fats, added sugars, or salt (sodium). ?General instructions ?Take over-the-counter and prescription medicines only as told by your health care provider. ?Keep all follow-up visits as told by your health care provider. This is important. ?Where to find support ?Talking to others ? ?Friends and family members can be sources of support and guidance. Talk to trusted friends or family members about your condition. Explain your symptoms to them, and let them know that you are working with a health care provider to treat your depression. Tell friends and family members how they also can be helpful. ?Finances ?Find appropriate mental health providers that fit with your financial situation. ?Talk with your health care provider about options to get reduced prices on your medicines. ?Where to find more information ?You can find support in your area from: ?Anxiety and Depression Association of America (ADAA): ProgramCam.de ?Mental Health America: www.mentalhealthamerica.net ?National Alliance on Mental Illness: www.nami.org ?Contact a health care provider if: ?You stop taking your antidepressant medicines, and you have any of these symptoms: ?Nausea. ?Headache. ?Light-headedness. ?Chills and body aches. ?Not being able to sleep (insomnia). ?You or your friends and family think your depression is getting worse. ?Get help right away if: ?You have thoughts of hurting yourself or others. ?If you ever feel like you may hurt yourself or others, or have thoughts about taking your own life, get help right away. Go to your nearest emergency department or: ?Call your local emergency services (911 in the [place] ?Call a suicide crisis helpline, such as the National Suicide Prevention Lifeline at (662)654-8521 or 988 in the [place] This is open 24 hours a day in the  [place] ?Text the Crisis Text Line at 308 653 1197 (in the [place] ?Summary ?If you are diagnosed with depression, preparing yourself to manage your symptoms is a good way to feel positive about your future. ?Work with your health care provider on a management plan that includes stress reduction techniques, medicines (if applicable), therapy, and healthy lifestyle habits. ?Keep talking with your health care provider about how your treatment is working. ?If you have thoughts about taking your own life, call a suicide crisis helpline or text a crisis text line. ?This information is not intended to replace advice given to you by your health care provider. Make sure you discuss any questions you have with your health care provider. ?Document Revised: 12/19/2020 Document Reviewed: 04/06/2019 ?Elsevier Patient Education ? 2023 Elsevier Inc. ? ?Continue medications ?Follow-up with cardiology as scheduled ?Increase food intake ?Begin Lexapro 10 mg daily for depression ?Follow-up in 4-weeks ? ? ?Coronary Artery Disease, Male ?Coronary artery disease (CAD) is a condition in which the arteries that lead to the heart (coronary arteries) become narrow or blocked. The narrowing or blockage can lead to decreased blood flow to the heart. Prolonged reduced blood flow can cause a heart attack (myocardial infarction, or MI). This condition may also be called coronary heart disease. ?CAD is the most common type of heart disease, and heart disease is the leading cause of death in men. It is important to understand what causes CAD and how it is treated. ?What are the causes? ?CAD is most often caused by atherosclerosis. This  is the buildup of fat and cholesterol (plaque) on the inside of the arteries. Over time, the plaque may narrow or block the artery, reducing blood flow to the heart. Plaque can also become weak and break off within a coronary artery and cause a sudden blockage. Other less common causes of CAD include: ?A blood clot or a piece  of another substance that blocks the flow of blood in a coronary artery (embolism). ?A tearing of the artery (spontaneous coronary artery dissection). ?An enlargement of an artery (aneurysm). ?Inflammation (vasc
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
## 2021-10-24 ENCOUNTER — Inpatient Hospital Stay (HOSPITAL_COMMUNITY): Payer: Medicare HMO

## 2021-10-24 ENCOUNTER — Inpatient Hospital Stay (HOSPITAL_COMMUNITY)
Admission: AD | Admit: 2021-10-24 | Discharge: 2021-11-18 | DRG: 219 | Disposition: A | Discharge status: Home Health | Payer: Medicare HMO | Source: Other Acute Inpatient Hospital | Attending: Surgery | Admitting: Surgery

## 2021-10-24 DIAGNOSIS — F32A Depression, unspecified: Secondary | ICD-10-CM | POA: Diagnosis not present

## 2021-10-24 DIAGNOSIS — Z87891 Personal history of nicotine dependence: Secondary | ICD-10-CM | POA: Diagnosis not present

## 2021-10-24 DIAGNOSIS — I309 Acute pericarditis, unspecified: Secondary | ICD-10-CM | POA: Diagnosis present

## 2021-10-24 DIAGNOSIS — I5023 Acute on chronic systolic (congestive) heart failure: Secondary | ICD-10-CM | POA: Diagnosis not present

## 2021-10-24 DIAGNOSIS — Z01810 Encounter for preprocedural cardiovascular examination: Secondary | ICD-10-CM | POA: Diagnosis not present

## 2021-10-24 DIAGNOSIS — I2510 Atherosclerotic heart disease of native coronary artery without angina pectoris: Secondary | ICD-10-CM | POA: Diagnosis not present

## 2021-10-24 DIAGNOSIS — I080 Rheumatic disorders of both mitral and aortic valves: Secondary | ICD-10-CM | POA: Diagnosis not present

## 2021-10-24 DIAGNOSIS — Z20822 Contact with and (suspected) exposure to covid-19: Secondary | ICD-10-CM | POA: Diagnosis not present

## 2021-10-24 DIAGNOSIS — I2119 ST elevation (STEMI) myocardial infarction involving other coronary artery of inferior wall: Secondary | ICD-10-CM | POA: Diagnosis not present

## 2021-10-24 DIAGNOSIS — R54 Age-related physical debility: Secondary | ICD-10-CM | POA: Diagnosis not present

## 2021-10-24 DIAGNOSIS — E785 Hyperlipidemia, unspecified: Secondary | ICD-10-CM | POA: Diagnosis not present

## 2021-10-24 DIAGNOSIS — R42 Dizziness and giddiness: Secondary | ICD-10-CM | POA: Diagnosis not present

## 2021-10-24 DIAGNOSIS — I25119 Atherosclerotic heart disease of native coronary artery with unspecified angina pectoris: Secondary | ICD-10-CM | POA: Diagnosis present

## 2021-10-24 DIAGNOSIS — I5043 Acute on chronic combined systolic (congestive) and diastolic (congestive) heart failure: Secondary | ICD-10-CM | POA: Diagnosis not present

## 2021-10-24 DIAGNOSIS — E119 Type 2 diabetes mellitus without complications: Secondary | ICD-10-CM | POA: Diagnosis present

## 2021-10-24 DIAGNOSIS — K567 Ileus, unspecified: Secondary | ICD-10-CM | POA: Diagnosis not present

## 2021-10-24 DIAGNOSIS — E871 Hypo-osmolality and hyponatremia: Secondary | ICD-10-CM | POA: Diagnosis not present

## 2021-10-24 DIAGNOSIS — M1990 Unspecified osteoarthritis, unspecified site: Secondary | ICD-10-CM | POA: Diagnosis present

## 2021-10-24 DIAGNOSIS — I214 Non-ST elevation (NSTEMI) myocardial infarction: Secondary | ICD-10-CM | POA: Diagnosis not present

## 2021-10-24 DIAGNOSIS — I480 Paroxysmal atrial fibrillation: Secondary | ICD-10-CM | POA: Diagnosis present

## 2021-10-24 DIAGNOSIS — I517 Cardiomegaly: Secondary | ICD-10-CM | POA: Diagnosis not present

## 2021-10-24 DIAGNOSIS — I493 Ventricular premature depolarization: Secondary | ICD-10-CM | POA: Diagnosis not present

## 2021-10-24 DIAGNOSIS — I351 Nonrheumatic aortic (valve) insufficiency: Secondary | ICD-10-CM

## 2021-10-24 DIAGNOSIS — Z951 Presence of aortocoronary bypass graft: Secondary | ICD-10-CM

## 2021-10-24 DIAGNOSIS — R69 Illness, unspecified: Secondary | ICD-10-CM | POA: Diagnosis not present

## 2021-10-24 DIAGNOSIS — R531 Weakness: Secondary | ICD-10-CM | POA: Diagnosis not present

## 2021-10-24 DIAGNOSIS — I252 Old myocardial infarction: Secondary | ICD-10-CM | POA: Diagnosis not present

## 2021-10-24 DIAGNOSIS — I3139 Other pericardial effusion (noninflammatory): Secondary | ICD-10-CM

## 2021-10-24 DIAGNOSIS — H409 Unspecified glaucoma: Secondary | ICD-10-CM | POA: Diagnosis not present

## 2021-10-24 DIAGNOSIS — I2582 Chronic total occlusion of coronary artery: Secondary | ICD-10-CM | POA: Diagnosis not present

## 2021-10-24 DIAGNOSIS — I110 Hypertensive heart disease with heart failure: Secondary | ICD-10-CM | POA: Diagnosis not present

## 2021-10-24 DIAGNOSIS — J811 Chronic pulmonary edema: Secondary | ICD-10-CM | POA: Diagnosis not present

## 2021-10-24 DIAGNOSIS — R1319 Other dysphagia: Secondary | ICD-10-CM

## 2021-10-24 DIAGNOSIS — Z79899 Other long term (current) drug therapy: Secondary | ICD-10-CM

## 2021-10-24 DIAGNOSIS — I509 Heart failure, unspecified: Secondary | ICD-10-CM | POA: Diagnosis not present

## 2021-10-24 DIAGNOSIS — I340 Nonrheumatic mitral (valve) insufficiency: Secondary | ICD-10-CM | POA: Diagnosis not present

## 2021-10-24 DIAGNOSIS — J90 Pleural effusion, not elsewhere classified: Secondary | ICD-10-CM | POA: Diagnosis not present

## 2021-10-24 DIAGNOSIS — K219 Gastro-esophageal reflux disease without esophagitis: Secondary | ICD-10-CM | POA: Diagnosis not present

## 2021-10-24 DIAGNOSIS — R0600 Dyspnea, unspecified: Secondary | ICD-10-CM | POA: Diagnosis not present

## 2021-10-24 DIAGNOSIS — I5021 Acute systolic (congestive) heart failure: Secondary | ICD-10-CM | POA: Diagnosis not present

## 2021-10-24 DIAGNOSIS — J939 Pneumothorax, unspecified: Secondary | ICD-10-CM | POA: Diagnosis not present

## 2021-10-24 DIAGNOSIS — Z8249 Family history of ischemic heart disease and other diseases of the circulatory system: Secondary | ICD-10-CM

## 2021-10-24 DIAGNOSIS — I255 Ischemic cardiomyopathy: Secondary | ICD-10-CM | POA: Diagnosis not present

## 2021-10-24 DIAGNOSIS — E876 Hypokalemia: Secondary | ICD-10-CM | POA: Diagnosis not present

## 2021-10-24 DIAGNOSIS — I350 Nonrheumatic aortic (valve) stenosis: Secondary | ICD-10-CM | POA: Diagnosis not present

## 2021-10-24 DIAGNOSIS — I083 Combined rheumatic disorders of mitral, aortic and tricuspid valves: Secondary | ICD-10-CM | POA: Diagnosis not present

## 2021-10-24 DIAGNOSIS — R1310 Dysphagia, unspecified: Secondary | ICD-10-CM | POA: Diagnosis not present

## 2021-10-24 DIAGNOSIS — K449 Diaphragmatic hernia without obstruction or gangrene: Secondary | ICD-10-CM | POA: Diagnosis not present

## 2021-10-24 DIAGNOSIS — J449 Chronic obstructive pulmonary disease, unspecified: Secondary | ICD-10-CM | POA: Diagnosis not present

## 2021-10-24 DIAGNOSIS — R918 Other nonspecific abnormal finding of lung field: Secondary | ICD-10-CM | POA: Diagnosis not present

## 2021-10-24 DIAGNOSIS — Z952 Presence of prosthetic heart valve: Secondary | ICD-10-CM | POA: Diagnosis not present

## 2021-10-24 DIAGNOSIS — K222 Esophageal obstruction: Secondary | ICD-10-CM | POA: Diagnosis present

## 2021-10-24 DIAGNOSIS — D62 Acute posthemorrhagic anemia: Secondary | ICD-10-CM | POA: Diagnosis not present

## 2021-10-24 DIAGNOSIS — M546 Pain in thoracic spine: Secondary | ICD-10-CM | POA: Diagnosis not present

## 2021-10-24 DIAGNOSIS — Z7951 Long term (current) use of inhaled steroids: Secondary | ICD-10-CM

## 2021-10-24 DIAGNOSIS — I4892 Unspecified atrial flutter: Secondary | ICD-10-CM | POA: Diagnosis not present

## 2021-10-24 DIAGNOSIS — Z7982 Long term (current) use of aspirin: Secondary | ICD-10-CM

## 2021-10-24 DIAGNOSIS — I4891 Unspecified atrial fibrillation: Secondary | ICD-10-CM | POA: Diagnosis not present

## 2021-10-24 DIAGNOSIS — I700 Atherosclerosis of aorta: Secondary | ICD-10-CM | POA: Diagnosis not present

## 2021-10-24 LAB — COMPREHENSIVE METABOLIC PANEL
ALT: 26 U/L (ref 0–44)
AST: 21 U/L (ref 15–41)
Albumin: 2.7 g/dL — ABNORMAL LOW (ref 3.5–5.0)
Alkaline Phosphatase: 63 U/L (ref 38–126)
Anion gap: 7 (ref 5–15)
BUN: 26 mg/dL — ABNORMAL HIGH (ref 8–23)
CO2: 19 mmol/L — ABNORMAL LOW (ref 22–32)
Calcium: 8.2 mg/dL — ABNORMAL LOW (ref 8.9–10.3)
Chloride: 104 mmol/L (ref 98–111)
Creatinine, Ser: 0.96 mg/dL (ref 0.61–1.24)
GFR, Estimated: >60 mL/min (ref >60)
Glucose, Bld: 145 mg/dL — ABNORMAL HIGH (ref 70–99)
Potassium: 4.4 mmol/L (ref 3.5–5.1)
Sodium: 130 mmol/L — ABNORMAL LOW (ref 135–145)
Total Bilirubin: 0.9 mg/dL (ref 0.3–1.2)
Total Protein: 6.4 g/dL — ABNORMAL LOW (ref 6.5–8.1)

## 2021-10-24 LAB — ECHOCARDIOGRAM LIMITED
Calc EF: 32 %
Single Plane A2C EF: 27.8 %
Single Plane A4C EF: 34 %
Weight: 3456.81 oz

## 2021-10-24 LAB — BRAIN NATRIURETIC PEPTIDE: B Natriuretic Peptide: 1306.1 pg/mL — ABNORMAL HIGH (ref 0.0–100.0)

## 2021-10-24 LAB — CBC
HCT: 37.7 % — ABNORMAL LOW (ref 39.0–52.0)
Hemoglobin: 13.2 g/dL (ref 13.0–17.0)
MCH: 29.7 pg (ref 26.0–34.0)
MCHC: 35 g/dL (ref 30.0–36.0)
MCV: 84.9 fL (ref 80.0–100.0)
Platelets: 396 10*3/uL (ref 150–400)
RBC: 4.44 MIL/uL (ref 4.22–5.81)
RDW: 13.9 % (ref 11.5–15.5)
WBC: 13.5 10*3/uL — ABNORMAL HIGH (ref 4.0–10.5)
nRBC: 0 % (ref 0.0–0.2)

## 2021-10-24 LAB — PROCALCITONIN: Procalcitonin: <0.1 ng/mL

## 2021-10-24 LAB — TROPONIN I (HIGH SENSITIVITY): Troponin I (High Sensitivity): 858 ng/L (ref <18)

## 2021-10-24 LAB — MAGNESIUM: Magnesium: 2.4 mg/dL (ref 1.7–2.4)

## 2021-10-24 LAB — LACTIC ACID, PLASMA: Lactic Acid, Venous: 1.6 mmol/L (ref 0.5–1.9)

## 2021-10-24 MED ORDER — ATORVASTATIN CALCIUM 80 MG PO TABS
80.0000 mg | ORAL_TABLET | Freq: Every day | ORAL | Status: DC
  Administered 2021-10-25 – 2021-10-31 (×7): 80 mg via ORAL
  Filled 2021-10-24 (×7): qty 1

## 2021-10-24 MED ORDER — ALBUTEROL SULFATE HFA 108 (90 BASE) MCG/ACT IN AERS: 2.0000 | INHALATION_SPRAY | Freq: Four times a day (QID) | RESPIRATORY_TRACT | Status: DC | PRN

## 2021-10-24 MED ORDER — ESCITALOPRAM OXALATE 10 MG PO TABS
10.0000 mg | ORAL_TABLET | Freq: Every day | ORAL | Status: DC
  Administered 2021-10-25 – 2021-10-31 (×7): 10 mg via ORAL
  Filled 2021-10-24 (×7): qty 1

## 2021-10-24 MED ORDER — LOPERAMIDE HCL 2 MG PO CAPS
2.0000 mg | ORAL_CAPSULE | ORAL | Status: DC | PRN
  Administered 2021-10-25: 2 mg via ORAL
  Filled 2021-10-24: qty 1

## 2021-10-24 MED ORDER — ZOLPIDEM TARTRATE 5 MG PO TABS
5.0000 mg | ORAL_TABLET | Freq: Every evening | ORAL | Status: DC | PRN
  Administered 2021-10-25 – 2021-10-31 (×8): 5 mg via ORAL
  Filled 2021-10-24 (×8): qty 1

## 2021-10-24 MED ORDER — FUROSEMIDE 10 MG/ML IJ SOLN
40.0000 mg | Freq: Two times a day (BID) | INTRAMUSCULAR | Status: DC
Start: 2021-10-24 — End: 2021-10-25
  Administered 2021-10-24 – 2021-10-25 (×2): 40 mg via INTRAVENOUS
  Filled 2021-10-24 (×2): qty 4

## 2021-10-24 MED ORDER — SODIUM CHLORIDE 0.9 % IV SOLN: 250.0000 mL | INTRAVENOUS | Status: DC | PRN

## 2021-10-24 MED ORDER — DIGOXIN 125 MCG PO TABS: 0.1250 mg | ORAL_TABLET | Freq: Every day | ORAL | Status: DC

## 2021-10-24 MED ORDER — PANTOPRAZOLE SODIUM 40 MG PO TBEC
80.0000 mg | DELAYED_RELEASE_TABLET | Freq: Every day | ORAL | Status: DC
Start: 2021-10-24 — End: 2021-11-01
  Administered 2021-10-24 – 2021-10-31 (×7): 80 mg via ORAL
  Filled 2021-10-24 (×8): qty 2

## 2021-10-24 MED ORDER — ONDANSETRON HCL 4 MG/2ML IJ SOLN
4.0000 mg | Freq: Four times a day (QID) | INTRAMUSCULAR | Status: DC | PRN
  Administered 2021-10-29 – 2021-10-31 (×2): 4 mg via INTRAVENOUS
  Filled 2021-10-24 (×2): qty 2

## 2021-10-24 MED ORDER — GABAPENTIN 300 MG PO CAPS
300.0000 mg | ORAL_CAPSULE | Freq: Every day | ORAL | Status: DC
  Administered 2021-10-24 – 2021-10-31 (×8): 300 mg via ORAL
  Filled 2021-10-24 (×8): qty 1

## 2021-10-24 MED ORDER — SODIUM CHLORIDE 0.9% FLUSH: 3.0000 mL | INTRAVENOUS | Status: DC | PRN

## 2021-10-24 MED ORDER — DAPAGLIFLOZIN PROPANEDIOL 10 MG PO TABS
10.0000 mg | ORAL_TABLET | Freq: Every day | ORAL | Status: DC
  Administered 2021-10-25 – 2021-10-31 (×7): 10 mg via ORAL
  Filled 2021-10-24 (×9): qty 1

## 2021-10-24 MED ORDER — DIGOXIN 125 MCG PO TABS
0.1250 mg | ORAL_TABLET | Freq: Every day | ORAL | Status: DC
Start: 2021-10-25 — End: 2021-11-01
  Administered 2021-10-25 – 2021-10-31 (×7): 0.125 mg via ORAL
  Filled 2021-10-24 (×7): qty 1

## 2021-10-24 MED ORDER — LATANOPROST 0.005 % OP SOLN
1.0000 [drp] | Freq: Every day | OPHTHALMIC | Status: DC
  Administered 2021-10-25 – 2021-11-17 (×24): 1 [drp] via OPHTHALMIC
  Filled 2021-10-24 (×3): qty 2.5

## 2021-10-24 MED ORDER — ASPIRIN 81 MG PO TBEC
81.0000 mg | DELAYED_RELEASE_TABLET | Freq: Every day | ORAL | Status: DC
  Administered 2021-10-25 – 2021-10-31 (×7): 81 mg via ORAL
  Filled 2021-10-24 (×7): qty 1

## 2021-10-24 MED ORDER — HEPARIN (PORCINE) 25000 UT/250ML-% IV SOLN
1950.0000 [IU]/h | INTRAVENOUS | Status: DC
  Administered 2021-10-24: 1500 [IU]/h via INTRAVENOUS
  Administered 2021-10-25: 2000 [IU]/h via INTRAVENOUS
  Administered 2021-10-27: 1950 [IU]/h via INTRAVENOUS
  Administered 2021-10-27: 2000 [IU]/h via INTRAVENOUS
  Administered 2021-10-28: 1950 [IU]/h via INTRAVENOUS
  Filled 2021-10-24 (×8): qty 250

## 2021-10-24 MED ORDER — HEPARIN BOLUS VIA INFUSION
4000.0000 [IU] | Freq: Once | INTRAVENOUS | Status: AC
  Administered 2021-10-24: 4000 [IU] via INTRAVENOUS
  Filled 2021-10-24: qty 4000

## 2021-10-24 MED ORDER — ALBUTEROL SULFATE (2.5 MG/3ML) 0.083% IN NEBU
2.5000 mg | INHALATION_SOLUTION | Freq: Four times a day (QID) | RESPIRATORY_TRACT | Status: DC | PRN
Start: 2021-10-24 — End: 2021-11-01

## 2021-10-24 MED ORDER — SPIRONOLACTONE 25 MG PO TABS
25.0000 mg | ORAL_TABLET | Freq: Every day | ORAL | Status: DC
  Administered 2021-10-25 – 2021-10-31 (×7): 25 mg via ORAL
  Filled 2021-10-24 (×7): qty 1

## 2021-10-24 MED ORDER — SODIUM CHLORIDE 0.9% FLUSH
3.0000 mL | Freq: Two times a day (BID) | INTRAVENOUS | Status: DC
  Administered 2021-10-24 – 2021-10-31 (×9): 3 mL via INTRAVENOUS

## 2021-10-24 MED ORDER — NITROGLYCERIN 0.4 MG SL SUBL
0.4000 mg | SUBLINGUAL_TABLET | SUBLINGUAL | Status: DC | PRN
  Administered 2021-10-24 – 2021-10-29 (×4): 0.4 mg via SUBLINGUAL
  Filled 2021-10-24 (×4): qty 1

## 2021-10-24 MED ORDER — ACETAMINOPHEN 325 MG PO TABS
650.0000 mg | ORAL_TABLET | ORAL | Status: DC | PRN
  Administered 2021-10-26 – 2021-10-30 (×6): 650 mg via ORAL
  Filled 2021-10-24 (×7): qty 2

## 2021-10-24 MED ORDER — GUAIFENESIN-DM 100-10 MG/5ML PO SYRP
5.0000 mL | ORAL_SOLUTION | ORAL | Status: DC | PRN
  Administered 2021-10-25 – 2021-10-29 (×7): 5 mL via ORAL
  Filled 2021-10-24 (×8): qty 5

## 2021-10-24 NOTE — Progress Notes (Signed)
  Echocardiogram 2D Echocardiogram has been performed.  Keith Jenkins 10/24/2021, 9:39 PM
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
## 2021-10-24 NOTE — Progress Notes (Signed)
Patient recently admitted from [facility] with CHF and STEMI. Troponin 868. Heparin drip in progress. Paged MD.

## 2021-10-24 NOTE — Progress Notes (Signed)
ANTICOAGULATION CONSULT NOTE - Initial Consult  Pharmacy Consult for heparin Indication: chest pain/ACS  No Known Allergies  Patient Measurements: Weight: 98 kg (216 lb 0.8 oz) Heparin Dosing Weight: 98kg  Vital Signs: Temp: 97.8 F (36.6 C) (05/18 1938) Temp Source: Oral (05/18 1938) BP: 108/80 (05/18 2045) Pulse Rate: 110 (05/18 2045)  Labs: Recent Labs    10/22/21 1623  CREATININE 1.00    Estimated Creatinine Clearance: 79.8 mL/min (by C-G formula based on SCr of 1 mg/dL).   Medical History: Past Medical History:  Diagnosis Date   Arthritis    Glaucoma     Medications:  Medications Prior to Admission  Medication Sig Dispense Refill Last Dose   albuterol (VENTOLIN HFA) 108 (90 Base) MCG/ACT inhaler Inhale 2 puffs into the lungs every 6 (six) hours as needed for wheezing or shortness of breath. 8 g 0 Past Week   aspirin 81 MG EC tablet Take 1 tablet (81 mg total) by mouth daily. Swallow whole. 30 tablet 1 10/23/2021   atorvastatin (LIPITOR) 80 MG tablet Take 1 tablet (80 mg total) by mouth daily. 30 tablet 1 10/23/2021   dapagliflozin propanediol (FARXIGA) 10 MG TABS tablet Take 1 tablet (10 mg total) by mouth daily. 30 tablet 1 10/23/2021   digoxin (LANOXIN) 0.125 MG tablet Take 1 tablet (0.125 mg total) by mouth daily. 30 tablet 1 10/23/2021   escitalopram (LEXAPRO) 10 MG tablet Take 1 tablet (10 mg total) by mouth daily. 30 tablet 0 10/23/2021   esomeprazole (NEXIUM) 40 MG capsule Take 40 mg by mouth daily at 12 noon.   10/23/2021   Fluticasone-Umeclidin-Vilant (TRELEGY ELLIPTA) 100-62.5-25 MCG/ACT AEPB Inhale 1 puff into the lungs daily. 2 each 0 Past Week   furosemide (LASIX) 20 MG tablet Take 1 tablet (20 mg total) by mouth daily. As needed for weight gain > 3lb in a day or 5 lb in a week (Patient taking differently: Take 20 mg by mouth daily.) 30 tablet 6 10/23/2021   gabapentin (NEURONTIN) 300 MG capsule Take 1 capsule (300 mg total) by mouth at bedtime. 90 capsule 0  10/23/2021   latanoprost (XALATAN) 0.005 % ophthalmic solution Place 1 drop into both eyes at bedtime.   10/23/2021   losartan (COZAAR) 25 MG tablet Take 0.5 tablets (12.5 mg total) by mouth at bedtime. 15 tablet 1 10/23/2021   nitroGLYCERIN (NITROSTAT) 0.4 MG SL tablet Place 1 tablet (0.4 mg total) under the tongue every 5 (five) minutes as needed for chest pain. 25 tablet 3 unknown   OVER THE COUNTER MEDICATION Apply 1 application. topically daily as needed (For back pain). Medication: Magnilife Cream   unknown   potassium chloride SA (KLOR-CON M) 20 MEQ tablet Take 1 tablet (20 mEq total) by mouth daily. Take 1 tablet as needed when taking furosemide 30 tablet 5 10/23/2021   spironolactone (ALDACTONE) 25 MG tablet Take 1 tablet (25 mg total) by mouth daily. 30 tablet 1 10/23/2021   Scheduled:   [START ON 10/25/2021] aspirin EC  81 mg Oral Daily   [START ON 10/25/2021] atorvastatin  80 mg Oral Daily   [START ON 10/25/2021] dapagliflozin propanediol  10 mg Oral Daily   [START ON 10/25/2021] digoxin  0.125 mg Oral Daily   [START ON 10/25/2021] escitalopram  10 mg Oral Daily   furosemide  40 mg Intravenous BID   gabapentin  300 mg Oral QHS   heparin  4,000 Units Intravenous Once   latanoprost  1 drop Both Eyes QHS   [  START ON 10/25/2021] pantoprazole  80 mg Oral Q1200   sodium chloride flush  3 mL Intravenous Q12H   [START ON 10/25/2021] spironolactone  25 mg Oral Daily   Infusions:   sodium chloride     heparin      Assessment: Pt with a hx of CAD who was admitted with CP. Heparin for anticoagulation. No anticoagulation PTA.    Goal of Therapy:  Heparin level 0.3-0.7 units/ml Monitor platelets by anticoagulation protocol: Yes   Plan:  Heparin bolus 4000 units x1 Heparin infusion 1500 units/hr Check heparin in AM then daily  Ulyses Southward, PharmD, BCIDP, AAHIVP, CPP Infectious Disease Pharmacist 10/24/2021 9:48 PM

## 2021-10-25 ENCOUNTER — Inpatient Hospital Stay: Payer: Self-pay

## 2021-10-25 DIAGNOSIS — I2119 ST elevation (STEMI) myocardial infarction involving other coronary artery of inferior wall: Secondary | ICD-10-CM

## 2021-10-25 DIAGNOSIS — I5043 Acute on chronic combined systolic (congestive) and diastolic (congestive) heart failure: Secondary | ICD-10-CM | POA: Diagnosis not present

## 2021-10-25 DIAGNOSIS — I2510 Atherosclerotic heart disease of native coronary artery without angina pectoris: Secondary | ICD-10-CM

## 2021-10-25 LAB — BASIC METABOLIC PANEL
Anion gap: 8 (ref 5–15)
BUN: 29 mg/dL — ABNORMAL HIGH (ref 8–23)
CO2: 20 mmol/L — ABNORMAL LOW (ref 22–32)
Calcium: 8.1 mg/dL — ABNORMAL LOW (ref 8.9–10.3)
Chloride: 103 mmol/L (ref 98–111)
Creatinine, Ser: 1 mg/dL (ref 0.61–1.24)
GFR, Estimated: >60 mL/min (ref >60)
Glucose, Bld: 136 mg/dL — ABNORMAL HIGH (ref 70–99)
Potassium: 4.4 mmol/L (ref 3.5–5.1)
Sodium: 131 mmol/L — ABNORMAL LOW (ref 135–145)

## 2021-10-25 LAB — CBC
HCT: 34.7 % — ABNORMAL LOW (ref 39.0–52.0)
Hemoglobin: 12.4 g/dL — ABNORMAL LOW (ref 13.0–17.0)
MCH: 30.3 pg (ref 26.0–34.0)
MCHC: 35.7 g/dL (ref 30.0–36.0)
MCV: 84.8 fL (ref 80.0–100.0)
Platelets: 355 10*3/uL (ref 150–400)
RBC: 4.09 MIL/uL — ABNORMAL LOW (ref 4.22–5.81)
RDW: 13.9 % (ref 11.5–15.5)
WBC: 10.9 10*3/uL — ABNORMAL HIGH (ref 4.0–10.5)
nRBC: 0 % (ref 0.0–0.2)

## 2021-10-25 LAB — HEPARIN LEVEL (UNFRACTIONATED)
Heparin Unfractionated: 0.12 IU/mL — ABNORMAL LOW (ref 0.30–0.70)
Heparin Unfractionated: 0.3 IU/mL (ref 0.30–0.70)

## 2021-10-25 LAB — COOXEMETRY PANEL
Carboxyhemoglobin: 1.6 % — ABNORMAL HIGH (ref 0.5–1.5)
Methemoglobin: 0.8 % (ref 0.0–1.5)
O2 Saturation: 77.8 %
Total hemoglobin: 13.4 g/dL (ref 12.0–16.0)

## 2021-10-25 LAB — TROPONIN I (HIGH SENSITIVITY): Troponin I (High Sensitivity): 825 ng/L (ref <18)

## 2021-10-25 MED ORDER — HEPARIN BOLUS VIA INFUSION
2000.0000 [IU] | Freq: Once | INTRAVENOUS | Status: AC
  Administered 2021-10-25: 2000 [IU] via INTRAVENOUS
  Filled 2021-10-25: qty 2000

## 2021-10-25 MED ORDER — POTASSIUM CHLORIDE CRYS ER 20 MEQ PO TBCR
40.0000 meq | EXTENDED_RELEASE_TABLET | Freq: Once | ORAL | Status: AC
  Administered 2021-10-25: 40 meq via ORAL

## 2021-10-25 MED ORDER — MILRINONE LACTATE IN DEXTROSE 20-5 MG/100ML-% IV SOLN
0.1250 ug/kg/min | INTRAVENOUS | Status: DC
  Administered 2021-10-25: 0.125 ug/kg/min via INTRAVENOUS
  Filled 2021-10-25: qty 100

## 2021-10-25 MED ORDER — CHLORHEXIDINE GLUCONATE CLOTH 2 % EX PADS
6.0000 | MEDICATED_PAD | Freq: Every day | CUTANEOUS | Status: DC
  Administered 2021-10-26 – 2021-11-01 (×7): 6 via TOPICAL

## 2021-10-25 MED ORDER — FUROSEMIDE 10 MG/ML IJ SOLN
80.0000 mg | Freq: Two times a day (BID) | INTRAMUSCULAR | Status: DC
  Administered 2021-10-25 – 2021-10-29 (×8): 80 mg via INTRAVENOUS
  Filled 2021-10-25 (×8): qty 8

## 2021-10-25 MED ORDER — ENSURE ENLIVE PO LIQD
237.0000 mL | Freq: Two times a day (BID) | ORAL | Status: DC
  Administered 2021-10-25 – 2021-10-31 (×9): 237 mL via ORAL

## 2021-10-25 MED ORDER — SODIUM CHLORIDE 0.9% FLUSH
10.0000 mL | Freq: Two times a day (BID) | INTRAVENOUS | Status: DC
  Administered 2021-10-26 – 2021-10-31 (×11): 10 mL

## 2021-10-25 MED ORDER — SODIUM CHLORIDE 0.9% FLUSH: 10.0000 mL | INTRAVENOUS | Status: DC | PRN

## 2021-10-25 NOTE — Progress Notes (Signed)
   10/24/21 2045  Assess: MEWS Score  BP 108/80  Pulse Rate (!) 110  ECG Heart Rate (!) 110  Resp (!) 33  SpO2 94 %  Assess: MEWS Score  MEWS Temp 0  MEWS Systolic 0  MEWS Pulse 1  MEWS RR 2  MEWS LOC 0  MEWS Score 3  MEWS Score Color Yellow  Assess: if the MEWS score is Yellow or Red  Were vital signs taken at a resting state? Yes  Focused Assessment No change from prior assessment  Early Detection of Sepsis Score *See Row Information* Low  MEWS guidelines implemented *See Row Information* Yes  Take Vital Signs  Increase Vital Sign Frequency  Yellow: Q 2hr X 2 then Q 4hr X 2, if remains yellow, continue Q 4hrs  Escalate  MEWS: Escalate Yellow: discuss with charge nurse/RN and consider discussing with provider and RRT  Notify: Charge Nurse/RN  Name of Charge Nurse/RN Notified [place] RN  Date Charge Nurse/RN Notified 10/24/21  Time Charge Nurse/RN Notified 2200  Notify: Provider  Provider Name/Title DR Bjorn Pippin  Date Provider Notified 10/24/21  Time Provider Notified 2140  Method of Notification Face-to-face  Notification Reason Other (Comment) (chest pain, RR)  Provider response At bedside;See new orders  Date of Provider Response 10/24/21  Time of Provider Response 2140  Document  Patient Outcome Other (Comment) (lab test PRN MEDS given)

## 2021-10-25 NOTE — TOC Progression Note (Signed)
Transition of Care [facility]) - Progression Note    Patient Details  Name: Keith Jenkins MRN: [id number] Date of Birth: February 23, 1946  Transition of [facility] CM/SW Contact  Zenon Mayo, RN Phone Number: 10/25/2021, 4:57 PM  Clinical Narrative:    from home with wife, and daughter, readmit , plan was for CABG with TAVR in few weeks, now here with CHF , on IV lasix, hep drip, TOC will continue to follow for dc needs.         Expected Discharge Plan and Services                                                 Social Determinants of Health (SDOH) Interventions    Readmission Risk Interventions     View : No data to display.
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
## 2021-10-25 NOTE — Progress Notes (Signed)
Peripherally Inserted Central Catheter Placement  The IV Nurse has discussed with the patient and/or persons authorized to consent for the patient, the purpose of this procedure and the potential benefits and risks involved with this procedure.  The benefits include less needle sticks, lab draws from the catheter, and the patient may be discharged home with the catheter. Risks include, but not limited to, infection, bleeding, blood clot (thrombus formation), and puncture of an artery; nerve damage and irregular heartbeat and possibility to perform a PICC exchange if needed/ordered by physician.  Alternatives to this procedure were also discussed.  Bard Power PICC patient education guide, fact sheet on infection prevention and patient information card has been provided to patient /or left at bedside.    PICC Placement Documentation  PICC Double Lumen 0000000 Right Cephalic 41 cm 0 cm (Active)  Indication for Insertion or Continuance of Line Chronic illness with exacerbations (CF, Sickle Cell, etc.) 10/25/21 1600  Exposed Catheter (cm) 0 cm 10/25/21 1600  Site Assessment Clean, Dry, Intact 10/25/21 1600  Lumen #1 Status Flushed;Saline locked;Blood return noted 10/25/21 1600  Lumen #2 Status Flushed;Saline locked;Blood return noted 10/25/21 1600  Dressing Type Transparent;Securing device 10/25/21 1600  Dressing Status Antimicrobial disc in place 10/25/21 1600  Safety Lock Not Applicable 0000000 [id number]  Line Care Connections checked and tightened 10/25/21 1600  Dressing Intervention New dressing 10/25/21 1600  Dressing Change Due 11/01/21 10/25/21 1600       Holley Bouche Renee 10/25/2021, 4:44 PM

## 2021-10-25 NOTE — Progress Notes (Signed)
ANTICOAGULATION CONSULT NOTE - Initial Consult  Pharmacy Consult for heparin Indication: chest pain/ACS  No Known Allergies  Patient Measurements: Height: 6\' 1"  (185.4 cm) Weight: 97.8 kg (215 lb 9.8 oz) IBW/kg (Calculated) : 79.9 Heparin Dosing Weight: 98kg  Vital Signs: Temp: 97.5 F (36.4 C) (05/19 0723) Temp Source: Oral (05/19 0723) BP: 105/79 (05/19 0723) Pulse Rate: 99 (05/19 0723)  Labs: Recent Labs    10/22/21 1623 10/24/21 2200 10/25/21 0639  HGB  --  13.2 12.4*  HCT  --  37.7* 34.7*  PLT  --  396 355  HEPARINUNFRC  --   --  0.12*  CREATININE 1.00 0.96 1.00  TROPONINIHS  --  858* 825*     Estimated Creatinine Clearance: 79.8 mL/min (by C-G formula based on SCr of 1 mg/dL).   Medical History: Past Medical History:  Diagnosis Date   Arthritis    Glaucoma     Medications:  Medications Prior to Admission  Medication Sig Dispense Refill Last Dose   albuterol (VENTOLIN HFA) 108 (90 Base) MCG/ACT inhaler Inhale 2 puffs into the lungs every 6 (six) hours as needed for wheezing or shortness of breath. 8 g 0 Past Week   aspirin 81 MG EC tablet Take 1 tablet (81 mg total) by mouth daily. Swallow whole. 30 tablet 1 10/23/2021   atorvastatin (LIPITOR) 80 MG tablet Take 1 tablet (80 mg total) by mouth daily. 30 tablet 1 10/23/2021   dapagliflozin propanediol (FARXIGA) 10 MG TABS tablet Take 1 tablet (10 mg total) by mouth daily. 30 tablet 1 10/23/2021   digoxin (LANOXIN) 0.125 MG tablet Take 1 tablet (0.125 mg total) by mouth daily. 30 tablet 1 10/23/2021   escitalopram (LEXAPRO) 10 MG tablet Take 1 tablet (10 mg total) by mouth daily. 30 tablet 0 10/23/2021   esomeprazole (NEXIUM) 40 MG capsule Take 40 mg by mouth daily at 12 noon.   10/23/2021   Fluticasone-Umeclidin-Vilant (TRELEGY ELLIPTA) 100-62.5-25 MCG/ACT AEPB Inhale 1 puff into the lungs daily. 2 each 0 Past Week   furosemide (LASIX) 20 MG tablet Take 1 tablet (20 mg total) by mouth daily. As needed for weight  gain > 3lb in a day or 5 lb in a week (Patient taking differently: Take 20 mg by mouth daily.) 30 tablet 6 10/23/2021   gabapentin (NEURONTIN) 300 MG capsule Take 1 capsule (300 mg total) by mouth at bedtime. 90 capsule 0 10/23/2021   latanoprost (XALATAN) 0.005 % ophthalmic solution Place 1 drop into both eyes at bedtime.   10/23/2021   losartan (COZAAR) 25 MG tablet Take 0.5 tablets (12.5 mg total) by mouth at bedtime. 15 tablet 1 10/23/2021   nitroGLYCERIN (NITROSTAT) 0.4 MG SL tablet Place 1 tablet (0.4 mg total) under the tongue every 5 (five) minutes as needed for chest pain. 25 tablet 3 unknown   OVER THE COUNTER MEDICATION Apply 1 application. topically daily as needed (For back pain). Medication: Magnilife Cream   unknown   potassium chloride SA (KLOR-CON M) 20 MEQ tablet Take 1 tablet (20 mEq total) by mouth daily. Take 1 tablet as needed when taking furosemide 30 tablet 5 10/23/2021   spironolactone (ALDACTONE) 25 MG tablet Take 1 tablet (25 mg total) by mouth daily. 30 tablet 1 10/23/2021   Scheduled:   aspirin EC  81 mg Oral Daily   atorvastatin  80 mg Oral Daily   dapagliflozin propanediol  10 mg Oral Daily   digoxin  0.125 mg Oral Daily   escitalopram  10  mg Oral Daily   furosemide  40 mg Intravenous BID   gabapentin  300 mg Oral QHS   latanoprost  1 drop Both Eyes QHS   pantoprazole  80 mg Oral Q1200   sodium chloride flush  3 mL Intravenous Q12H   spironolactone  25 mg Oral Daily   Infusions:   sodium chloride     heparin 1,500 Units/hr (10/24/21 2210)    Assessment: Pt with a hx of CAD who was admitted with CP. No anticoagulation PTA. Pharmacy consulted for heparin dosing. Patient may be a candidate for CABG/MVR vs. PCI LAD and mTEER.   Heparin level 5/19 AM subtherapeutic at 0.12 with heparin running at 1500 units/hour. No signs of bleeding or issues with infusion noted per RN. CBC stable/ WNL.   Goal of Therapy:  Heparin level 0.3-0.7 units/ml Monitor platelets by  anticoagulation protocol: Yes   Plan:  Heparin bolus 2000 units x1 Increase heparin infusion to 1900 units/hr Check heparin in 8hr and daily   F/U surgery plans   Adria Dill, PharmD PGY-1 Acute Care Resident  10/25/2021 8:02 AM

## 2021-10-25 NOTE — Progress Notes (Addendum)
Fall precaution in place. Pt keep on sitting  at the edge of the bed, offered Geri chair but he refused. Bed alarm on. Reminded pt to call if he needs to get up.   Pt is now sitting in the geri chair after encouragement.Marland Kitchen He  feels better.   Pt complains loose stool that started at [place], PRN meds given. He Peed while having BM. Unmeasured.

## 2021-10-25 NOTE — Progress Notes (Signed)
Heart Failure Navigator Progress Note  Assessed for Heart & Vascular TOC clinic readiness.  Patient does not meet criteria due to patient of [facility] Heart Failure Team.    Rhae Hammock, BSN, RN Heart Failure Nurse Navigator Secure Chat Only
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
## 2021-10-25 NOTE — Consult Note (Signed)
   [facility] CM Inpatient Consult   10/25/2021  Keith Jenkins Feb 24, 1946 [id number]  [facility] [[facility]O] Patient: Keith Jenkins   Primary Care Provider:  Rip Harbour, NP, with [facility], is an embedded provider with a Chronic Care Management team and program, and is listed for the transition of care follow up and appointments.  Patient was assessed for readmission in less than 30 days and has an Embedded provider practice for needs for chronic care management or care coordination.  Met with the patient at the bedside, patient was asleep and wife and daughter was at the bedside. Reviewed for needs and wife states, "we just need everyone to pray."  She endorses PCP and is aware of upcoming appointments and states, "they're talking about possible surgery next week."  Plan: Continue to follow for Care Management  needs for the Embedded. Please contact for further questions,  Natividad Brood, RN BSN [facility] Liaison  [phone] business mobile phone Toll free office [phone]  Fax number: [phone] [email or web address]

## 2021-10-25 NOTE — Progress Notes (Signed)
Initial Nutrition Assessment  DOCUMENTATION CODES:   Not applicable  INTERVENTION:  Provide Ensure Enlive po BID, each supplement provides 350 kcal and 20 grams of protein.  Encourage adequate PO intake.   NUTRITION DIAGNOSIS:   Increased nutrient needs related to chronic illness (COPD, CHF) as evidenced by estimated needs.  GOAL:   Patient will meet greater than or equal to 90% of their needs  MONITOR:   PO intake, Supplement acceptance, Labs, Weight trends, Skin, I & O's  REASON FOR ASSESSMENT:   Malnutrition Screening Tool    ASSESSMENT:   76 y.o. male with  a history of COPD, prediabetes, GERD, hyperlipidemia who presents for the evaluation of chest pain. Pt with acute combined CHF/ICM.  Pt actively consuming lunch at time of visit. Pt reports poor appetite for 1 week PTA, however would still trying to consume his food at meals. Pt reports consuming Ensure at home once daily and requesting Ensure to be ordered while he is inpatient. RD to order nutritional supplements to aid in caloric and protein needs.  Unable to complete Nutrition-Focused physical exam at this time.   Labs and medications reviewed.   Diet Order:   Diet Order             Diet Heart Room service appropriate? Yes; Fluid consistency: Thin  Diet effective now                   EDUCATION NEEDS:   Not appropriate for education at this time  Skin:  Skin Assessment: Reviewed RN Assessment  Last BM:  5/19  Height:   Ht Readings from Last 1 Encounters:  10/24/21 6\' 1"  (1.854 m)    Weight:   Wt Readings from Last 1 Encounters:  10/25/21 98.2 kg   BMI:  Body mass index is 28.56 kg/m.  Estimated Nutritional Needs:   Kcal:  10/27/21  Protein:  115-125 grams  Fluid:  >/= 2 L/day  [phone], MS, RD, LDN RD pager number/after hours weekend pager number on Amion.

## 2021-10-25 NOTE — Progress Notes (Signed)
Mobility Specialist Progress Note:   10/25/21 1000  Mobility  Activity Dangled on edge of bed (+ STS)  Level of Assistance Standby assist, set-up cues, supervision of patient - no hands on  Assistive Device None  Activity Response Tolerated fair  $Mobility charge 1 Mobility   Pt in poor spirits this am d/t no sleep + CP. Agreeable to minimal mobility at this time. Pt sitting EOB, performed STS. Session limited d/t IV(heparin drip) coming out. Pt left sitting EOB with all needs met.   Nelta Numbers Acute Rehab Secure Chat or Office Phone: [phone]

## 2021-10-25 NOTE — Progress Notes (Signed)
ANTICOAGULATION CONSULT NOTE - Initial Consult  Pharmacy Consult for heparin Indication: chest pain/ACS  No Known Allergies  Patient Measurements: Height: 6\' 1"  (185.4 cm) Weight: 98.2 kg (216 lb 7.9 oz) IBW/kg (Calculated) : 79.9 Heparin Dosing Weight: 98kg  Vital Signs: Temp: 97.4 F (36.3 C) (05/19 1226) Temp Source: Oral (05/19 1226) BP: 96/73 (05/19 1910) Pulse Rate: 115 (05/19 1910)  Labs: Recent Labs    10/24/21 2200 10/25/21 0639 10/25/21 1904  HGB 13.2 12.4*  --   HCT 37.7* 34.7*  --   PLT 396 355  --   HEPARINUNFRC  --  0.12* 0.30  CREATININE 0.96 1.00  --   TROPONINIHS 858* 825*  --      Estimated Creatinine Clearance: 79.9 mL/min (by C-G formula based on SCr of 1 mg/dL).   Medical History: Past Medical History:  Diagnosis Date   Arthritis    Glaucoma     Medications:  Medications Prior to Admission  Medication Sig Dispense Refill Last Dose   albuterol (VENTOLIN HFA) 108 (90 Base) MCG/ACT inhaler Inhale 2 puffs into the lungs every 6 (six) hours as needed for wheezing or shortness of breath. 8 g 0 Past Week   aspirin 81 MG EC tablet Take 1 tablet (81 mg total) by mouth daily. Swallow whole. 30 tablet 1 10/23/2021   atorvastatin (LIPITOR) 80 MG tablet Take 1 tablet (80 mg total) by mouth daily. 30 tablet 1 10/23/2021   dapagliflozin propanediol (FARXIGA) 10 MG TABS tablet Take 1 tablet (10 mg total) by mouth daily. 30 tablet 1 10/23/2021   digoxin (LANOXIN) 0.125 MG tablet Take 1 tablet (0.125 mg total) by mouth daily. 30 tablet 1 10/23/2021   escitalopram (LEXAPRO) 10 MG tablet Take 1 tablet (10 mg total) by mouth daily. 30 tablet 0 10/23/2021   esomeprazole (NEXIUM) 40 MG capsule Take 40 mg by mouth daily at 12 noon.   10/23/2021   Fluticasone-Umeclidin-Vilant (TRELEGY ELLIPTA) 100-62.5-25 MCG/ACT AEPB Inhale 1 puff into the lungs daily. 2 each 0 Past Week   furosemide (LASIX) 20 MG tablet Take 1 tablet (20 mg total) by mouth daily. As needed for weight  gain > 3lb in a day or 5 lb in a week (Patient taking differently: Take 20 mg by mouth daily.) 30 tablet 6 10/23/2021   gabapentin (NEURONTIN) 300 MG capsule Take 1 capsule (300 mg total) by mouth at bedtime. 90 capsule 0 10/23/2021   latanoprost (XALATAN) 0.005 % ophthalmic solution Place 1 drop into both eyes at bedtime.   10/23/2021   losartan (COZAAR) 25 MG tablet Take 0.5 tablets (12.5 mg total) by mouth at bedtime. 15 tablet 1 10/23/2021   nitroGLYCERIN (NITROSTAT) 0.4 MG SL tablet Place 1 tablet (0.4 mg total) under the tongue every 5 (five) minutes as needed for chest pain. 25 tablet 3 unknown   OVER THE COUNTER MEDICATION Apply 1 application. topically daily as needed (For back pain). Medication: Magnilife Cream   unknown   potassium chloride SA (KLOR-CON M) 20 MEQ tablet Take 1 tablet (20 mEq total) by mouth daily. Take 1 tablet as needed when taking furosemide 30 tablet 5 10/23/2021   spironolactone (ALDACTONE) 25 MG tablet Take 1 tablet (25 mg total) by mouth daily. 30 tablet 1 10/23/2021   Scheduled:   aspirin EC  81 mg Oral Daily   atorvastatin  80 mg Oral Daily   Chlorhexidine Gluconate Cloth  6 each Topical Daily   dapagliflozin propanediol  10 mg Oral Daily   digoxin  0.125 mg Oral Daily   escitalopram  10 mg Oral Daily   feeding supplement  237 mL Oral BID BM   furosemide  80 mg Intravenous BID   gabapentin  300 mg Oral QHS   latanoprost  1 drop Both Eyes QHS   pantoprazole  80 mg Oral Q1200   sodium chloride flush  10-40 mL Intracatheter Q12H   sodium chloride flush  3 mL Intravenous Q12H   spironolactone  25 mg Oral Daily   Infusions:   sodium chloride     heparin 1,900 Units/hr (10/25/21 0829)   milrinone 0.125 mcg/kg/min (10/25/21 1835)    Assessment: Pt with a hx of CAD who was admitted with CP. No anticoagulation PTA. Pharmacy consulted for heparin dosing. Patient may be a candidate for CABG/MVR vs. PCI LAD and mTEER.   Heparin level 5/19 AM subtherapeutic at 0.12  with heparin running at 1500 units/hour. No signs of bleeding or issues with infusion noted per RN. CBC stable/ WNL.   Heparin level came back therapeutic tonightly but on the lower end. We will increase it slightly and check in AM.  Goal of Therapy:  Heparin level 0.3-0.7 units/ml Monitor platelets by anticoagulation protocol: Yes   Plan:  Increase heparin infusion to 2000 units/hr Check heparin in AM F/U surgery plans   Onnie Boer, PharmD, BCIDP, AAHIVP, CPP Infectious Disease Pharmacist 10/25/2021 8:23 PM

## 2021-10-26 DIAGNOSIS — I5043 Acute on chronic combined systolic (congestive) and diastolic (congestive) heart failure: Secondary | ICD-10-CM | POA: Diagnosis not present

## 2021-10-26 LAB — BASIC METABOLIC PANEL
Anion gap: 7 (ref 5–15)
BUN: 37 mg/dL — ABNORMAL HIGH (ref 8–23)
CO2: 23 mmol/L (ref 22–32)
Calcium: 8 mg/dL — ABNORMAL LOW (ref 8.9–10.3)
Chloride: 103 mmol/L (ref 98–111)
Creatinine, Ser: 1.09 mg/dL (ref 0.61–1.24)
GFR, Estimated: >60 mL/min (ref >60)
Glucose, Bld: 124 mg/dL — ABNORMAL HIGH (ref 70–99)
Potassium: 4.2 mmol/L (ref 3.5–5.1)
Sodium: 133 mmol/L — ABNORMAL LOW (ref 135–145)

## 2021-10-26 LAB — CBC
HCT: 32.2 % — ABNORMAL LOW (ref 39.0–52.0)
Hemoglobin: 11.2 g/dL — ABNORMAL LOW (ref 13.0–17.0)
MCH: 29.7 pg (ref 26.0–34.0)
MCHC: 34.8 g/dL (ref 30.0–36.0)
MCV: 85.4 fL (ref 80.0–100.0)
Platelets: 330 10*3/uL (ref 150–400)
RBC: 3.77 MIL/uL — ABNORMAL LOW (ref 4.22–5.81)
RDW: 13.7 % (ref 11.5–15.5)
WBC: 9.7 10*3/uL (ref 4.0–10.5)
nRBC: 0 % (ref 0.0–0.2)

## 2021-10-26 LAB — COOXEMETRY PANEL
Carboxyhemoglobin: 1.2 % (ref 0.5–1.5)
Carboxyhemoglobin: 1.4 % (ref 0.5–1.5)
Methemoglobin: <0.7 % (ref 0.0–1.5)
Methemoglobin: 0.9 % (ref 0.0–1.5)
O2 Saturation: 67.1 %
O2 Saturation: 72.9 %
Total hemoglobin: 11.7 g/dL — ABNORMAL LOW (ref 12.0–16.0)
Total hemoglobin: 12 g/dL (ref 12.0–16.0)

## 2021-10-26 LAB — HEPARIN LEVEL (UNFRACTIONATED): Heparin Unfractionated: 0.44 IU/mL (ref 0.30–0.70)

## 2021-10-26 MED ORDER — MILRINONE LACTATE IN DEXTROSE 20-5 MG/100ML-% IV SOLN
0.2500 ug/kg/min | INTRAVENOUS | Status: DC
  Administered 2021-10-26 – 2021-11-01 (×11): 0.25 ug/kg/min via INTRAVENOUS
  Filled 2021-10-26 (×10): qty 100

## 2021-10-26 NOTE — Progress Notes (Signed)
ANTICOAGULATION CONSULT NOTE - Initial Consult  Pharmacy Consult for heparin Indication: chest pain/ACS  No Known Allergies  Patient Measurements: Height: 6\' 1"  (185.4 cm) Weight: 97.9 kg (215 lb 14.4 oz) IBW/kg (Calculated) : 79.9 Heparin Dosing Weight: 98kg  Vital Signs: Temp: 97.6 F (36.4 C) (05/20 0536) Temp Source: Oral (05/20 0536) BP: 108/68 (05/20 0536) Pulse Rate: 97 (05/20 0536)  Labs: Recent Labs    10/24/21 2200 10/25/21 0639 10/25/21 1904 10/26/21 0530  HGB 13.2 12.4*  --  11.2*  HCT 37.7* 34.7*  --  32.2*  PLT 396 355  --  330  HEPARINUNFRC  --  0.12* 0.30 0.44  CREATININE 0.96 1.00  --  1.09  TROPONINIHS 858* 825*  --   --      Estimated Creatinine Clearance: 73.2 mL/min (by C-G formula based on SCr of 1.09 mg/dL).   Medical History: Past Medical History:  Diagnosis Date   Arthritis    Glaucoma     Medications:  Medications Prior to Admission  Medication Sig Dispense Refill Last Dose   albuterol (VENTOLIN HFA) 108 (90 Base) MCG/ACT inhaler Inhale 2 puffs into the lungs every 6 (six) hours as needed for wheezing or shortness of breath. 8 g 0 Past Week   aspirin 81 MG EC tablet Take 1 tablet (81 mg total) by mouth daily. Swallow whole. 30 tablet 1 10/23/2021   atorvastatin (LIPITOR) 80 MG tablet Take 1 tablet (80 mg total) by mouth daily. 30 tablet 1 10/23/2021   dapagliflozin propanediol (FARXIGA) 10 MG TABS tablet Take 1 tablet (10 mg total) by mouth daily. 30 tablet 1 10/23/2021   digoxin (LANOXIN) 0.125 MG tablet Take 1 tablet (0.125 mg total) by mouth daily. 30 tablet 1 10/23/2021   escitalopram (LEXAPRO) 10 MG tablet Take 1 tablet (10 mg total) by mouth daily. 30 tablet 0 10/23/2021   esomeprazole (NEXIUM) 40 MG capsule Take 40 mg by mouth daily at 12 noon.   10/23/2021   Fluticasone-Umeclidin-Vilant (TRELEGY ELLIPTA) 100-62.5-25 MCG/ACT AEPB Inhale 1 puff into the lungs daily. 2 each 0 Past Week   furosemide (LASIX) 20 MG tablet Take 1 tablet  (20 mg total) by mouth daily. As needed for weight gain > 3lb in a day or 5 lb in a week (Patient taking differently: Take 20 mg by mouth daily.) 30 tablet 6 10/23/2021   gabapentin (NEURONTIN) 300 MG capsule Take 1 capsule (300 mg total) by mouth at bedtime. 90 capsule 0 10/23/2021   latanoprost (XALATAN) 0.005 % ophthalmic solution Place 1 drop into both eyes at bedtime.   10/23/2021   losartan (COZAAR) 25 MG tablet Take 0.5 tablets (12.5 mg total) by mouth at bedtime. 15 tablet 1 10/23/2021   nitroGLYCERIN (NITROSTAT) 0.4 MG SL tablet Place 1 tablet (0.4 mg total) under the tongue every 5 (five) minutes as needed for chest pain. 25 tablet 3 unknown   OVER THE COUNTER MEDICATION Apply 1 application. topically daily as needed (For back pain). Medication: Magnilife Cream   unknown   potassium chloride SA (KLOR-CON M) 20 MEQ tablet Take 1 tablet (20 mEq total) by mouth daily. Take 1 tablet as needed when taking furosemide 30 tablet 5 10/23/2021   spironolactone (ALDACTONE) 25 MG tablet Take 1 tablet (25 mg total) by mouth daily. 30 tablet 1 10/23/2021   Scheduled:   aspirin EC  81 mg Oral Daily   atorvastatin  80 mg Oral Daily   Chlorhexidine Gluconate Cloth  6 each Topical Daily  dapagliflozin propanediol  10 mg Oral Daily   digoxin  0.125 mg Oral Daily   escitalopram  10 mg Oral Daily   feeding supplement  237 mL Oral BID BM   furosemide  80 mg Intravenous BID   gabapentin  300 mg Oral QHS   latanoprost  1 drop Both Eyes QHS   pantoprazole  80 mg Oral Q1200   sodium chloride flush  10-40 mL Intracatheter Q12H   sodium chloride flush  3 mL Intravenous Q12H   spironolactone  25 mg Oral Daily   Infusions:   sodium chloride     heparin 2,000 Units/hr (10/25/21 2333)   milrinone 0.125 mcg/kg/min (10/25/21 1835)    Assessment: Pt with a hx of CAD who was admitted with CP. No anticoagulation PTA. Pharmacy consulted for heparin dosing. Patient may be a candidate for CABG/MVR vs. PCI LAD and  mTEER.   Most recent heparin level continues to be therapeutic at 0.44 on heparin 2000 units/hr. CBC stable/ WNL.   Goal of Therapy:  Heparin level 0.3-0.7 units/ml Monitor platelets by anticoagulation protocol: Yes   Plan:  Continue heparin 2000 units/hr Daily heparin level, CBC Monitor for s/sx of bleeding F/U surgery plans   Thank you for including pharmacy in the care of this patient.  Lissa Merlin, PharmD PGY1 Acute Care Pharmacy Resident  Phone: [phone] 10/26/2021  7:16 AM  Please check [email or web address] for unit-specific pharmacy phone numbers.

## 2021-10-26 NOTE — Progress Notes (Signed)
Pt refusing bad alarm, he said " its aggravating". Pt is standby assist. Advised to call. Will monitor.

## 2021-10-26 NOTE — Progress Notes (Addendum)
Progress Note  Patient Name: Keith Jenkins Date of Encounter: 10/26/2021  [facility] Cardiologist:DB  Subjective   Breathing is fair   No CP  Wife says patient having problems with swallowing food   Has had for a long time   Never looked at   "stubborn"  Tends toward soft foods   Inpatient Medications    Scheduled Meds:  aspirin EC  81 mg Oral Daily   atorvastatin  80 mg Oral Daily   Chlorhexidine Gluconate Cloth  6 each Topical Daily   dapagliflozin propanediol  10 mg Oral Daily   digoxin  0.125 mg Oral Daily   escitalopram  10 mg Oral Daily   feeding supplement  237 mL Oral BID BM   furosemide  80 mg Intravenous BID   gabapentin  300 mg Oral QHS   latanoprost  1 drop Both Eyes QHS   pantoprazole  80 mg Oral Q1200   sodium chloride flush  10-40 mL Intracatheter Q12H   sodium chloride flush  3 mL Intravenous Q12H   spironolactone  25 mg Oral Daily   Continuous Infusions:  sodium chloride     heparin 2,000 Units/hr (10/25/21 2333)   milrinone 0.125 mcg/kg/min (10/25/21 1835)   PRN Meds: sodium chloride, acetaminophen, albuterol, guaiFENesin-dextromethorphan, loperamide, nitroGLYCERIN, ondansetron (ZOFRAN) IV, sodium chloride flush, sodium chloride flush, zolpidem   Vital Signs    Vitals:   10/25/21 2320 10/26/21 0047 10/26/21 0536 10/26/21 0724  BP: 104/65  108/68 103/79  Pulse: (!) 110  97 (!) 101  Resp: (!) 35  18 20  Temp: 97.8 F (36.6 C)  97.6 F (36.4 C) 98.4 F (36.9 C)  TempSrc: Oral  Oral Oral  SpO2: 96%  100% 97%  Weight:  97.9 kg    Height:        Intake/Output Summary (Last 24 hours) at 10/26/2021 0941 Last data filed at 10/26/2021 0541 Gross per 24 hour  Intake 789.55 ml  Output 1200 ml  Net -410.45 ml      10/26/2021   12:47 AM 10/25/2021    8:00 AM 10/25/2021    4:00 AM  Last 3 Weights  Weight (lbs) 215 lb 14.4 oz 216 lb 7.9 oz 215 lb 9.8 oz  Weight (kg) 97.932 kg 98.2 kg 97.8 kg      Telemetry    SR  - Personally  Reviewed  ECG    No new  - Personally Reviewed  Physical Exam   GEN: No acute distress.   Neck:  JVP is inreased   Cardiac: RRR  III/VI systolic murmru  Respiratory: Clear to auscultation bilaterally. GI: Soft, nontender, non-distended  MS: 1+ edema; No deformity. Neuro:  Nonfocal  Psych: Normal affect   Labs    High Sensitivity Troponin:   Recent Labs  Lab 10/24/21 2200 10/25/21 0639  TROPONINIHS 858* 825*     Chemistry Recent Labs  Lab 10/24/21 2200 10/25/21 0639 10/26/21 0530  NA 130* 131* 133*  K 4.4 4.4 4.2  CL 104 103 103  CO2 19* 20* 23  GLUCOSE 145* 136* 124*  BUN 26* 29* 37*  CREATININE 0.96 1.00 1.09  CALCIUM 8.2* 8.1* 8.0*  MG 2.4  --   --   PROT 6.4*  --   --   ALBUMIN 2.7*  --   --   AST 21  --   --   ALT 26  --   --   ALKPHOS 63  --   --  BILITOT 0.9  --   --   GFRNONAA >60 >60 >60  ANIONGAP 7 8 7     Lipids No results for input(s): CHOL, TRIG, HDL, LABVLDL, LDLCALC, CHOLHDL in the last 168 hours.  Hematology Recent Labs  Lab 10/24/21 2200 10/25/21 0639 10/26/21 0530  WBC 13.5* 10.9* 9.7  RBC 4.44 4.09* 3.77*  HGB 13.2 12.4* 11.2*  HCT 37.7* 34.7* 32.2*  MCV 84.9 84.8 85.4  MCH 29.7 30.3 29.7  MCHC 35.0 35.7 34.8  RDW 13.9 13.9 13.7  PLT 396 355 330   Thyroid No results for input(s): TSH, FREET4 in the last 168 hours.  BNP Recent Labs  Lab 10/22/21 1623 10/24/21 2150  BNP 1,226.7* 1,306.1*    DDimer No results for input(s): DDIMER in the last 168 hours.   Radiology    DG CHEST PORT 1 VIEW  Result Date: 10/24/2021 CLINICAL DATA:  Dyspnea EXAM: PORTABLE CHEST 1 VIEW COMPARISON:  10/23/2021 FINDINGS: Mild enlargement of the cardiopericardial silhouette. Blunting of both costophrenic angles, stable on the right and slightly more striking on the left, with persistent retrocardiac opacity. Atherosclerotic calcification of the aortic arch. IMPRESSION: 1. Bilateral pleural effusions, perhaps minimally increased on the left.  Persistent left lower lobe retrocardiac airspace opacity. 2.  Aortic Atherosclerosis (ICD10-I70.0). 3. Stable mild enlargement of the cardiopericardial silhouette. Electronically Signed   By: Van Clines M.D.   On: 10/24/2021 21:25   ECHOCARDIOGRAM LIMITED  Result Date: 10/24/2021    ECHOCARDIOGRAM LIMITED REPORT   Patient Name:   Keith Jenkins Date of Exam: 10/24/2021 Medical Rec #:  [id number]    Height:       73.0 in Accession #:    [id number]   Weight:       216.0 lb Date of Birth:  [birth year]    BSA:          2.223 m Patient Age:    76 years     BP:           108/80 mmHg Patient Gender: M            HR:           104 bpm. Exam Location:  Inpatient Procedure: Limited Echo, Limited Color Doppler and Cardiac Doppler STAT ECHO Indications:    pericardial effusion  History:        Patient has prior history of Echocardiogram examinations, most                 recent 10/14/2021. CAD, mitral regurgitation; Risk                 Factors:Dyslipidemia.  Sonographer:    Johny Chess RDCS Referring Phys: [id number] Stuart  1. Left ventricular ejection fraction, by estimation, is 30 to 35%. The left ventricle has moderately decreased function. The left ventricle demonstrates regional wall motion abnormalities. Inferior/inferolateral akinesis.  2. Right ventricular systolic function is moderately reduced. The right ventricular size is mildly enlarged. There is mildly elevated pulmonary artery systolic pressure. The estimated right ventricular systolic pressure is 123XX123 mmHg.  3. Eccentric posterior directed mitral regurgitation jet, not well evaluated on current study  4. The inferior vena cava is dilated in size with <50% respiratory variability, suggesting right atrial pressure of 15 mmHg.  5. Moderate pericardial effusion adjacent to RA/RV, best seen on subcostal images. IVC fixed/dilated but no significant mitral inflow respiratory variation or RV collapse seen to suggest tamponade  FINDINGS  Left Ventricle: Left ventricular ejection fraction,  by estimation, is 30 to 35%. The left ventricle has moderately decreased function. The left ventricle demonstrates regional wall motion abnormalities. Right Ventricle: The right ventricular size is mildly enlarged. Right ventricular systolic function is moderately reduced. There is mildly elevated pulmonary artery systolic pressure. The tricuspid regurgitant velocity is 2.29 m/s, and with an assumed right atrial pressure of 15 mmHg, the estimated right ventricular systolic pressure is 123XX123 mmHg. Pericardium: A moderately sized pericardial effusion is present. Aortic Valve: The aortic valve is calcified. Aortic valve regurgitation is mild. Venous: The inferior vena cava is dilated in size with less than 50% respiratory variability, suggesting right atrial pressure of 15 mmHg.  LV Volumes (MOD) LV vol d, MOD A2C: 162.0 ml LV vol d, MOD A4C: 126.0 ml LV vol s, MOD A2C: 117.0 ml LV vol s, MOD A4C: 83.1 ml LV SV MOD A2C:     45.0 ml LV SV MOD A4C:     126.0 ml LV SV MOD BP:      46.2 ml IVC IVC diam: 2.70 cm TRICUSPID VALVE TR Peak grad:   21.0 mmHg TR Vmax:        229.00 cm/s Oswaldo Milian MD Electronically signed by Oswaldo Milian MD Signature Date/Time: 10/24/2021/10:48:56 PM    Final    [place] EKG SITE RITE  Result Date: 10/25/2021 If Site Rite image not attached, placement could not be confirmed due to current cardiac rhythm.   Cardiac Studies     Patient Profile       76 y.o. male with hx COPD, prediabetes, HLD, GERD. Recent admit earlier this month with later presentation inferior STEMI and new systolic CHF w/ moderate to severe ischemic MR.   Readmitted with a/c CHF.    Assessment & Plan    1. CAD/recent late presentation inferior STEMI - LHC 05/04: 100% m RCA and 90% m LAD.  - RCA managed medically d/t completed infarct.  - Echo (5/23) EF 25%, RV moderately reduced, moderate to severe MR - Initial plan for PCI to LAD  but with moderate to severe ischemic MR has been seen by TCTS for consideration of CABG (LIMA to LAD)/MVR. Films reviewed with Dr. Burt Knack. LAD not ideal for PCI. Had planned for surgery in a few weeks once recovered from MI. - Now readmitted with a/c CHF and exertional CP with HS troponin [id number] - TCTS has been consulted to coordinate timing of surgery. Will need to optimize from HF standpoint. - Keep on heparin gtt + aspirin/statin    2. Acute on chronic systolic CHF/ICM - In setting of late presentation inferior STEMI 05/23 - Echo 05/23 EF 25-30%, RV moderately reduced, moderate to severe MR. - Limited echo on admit EF 30-35%, RV moderately reduced, MR not well evaluated, moderate pericardial effusion - NYHA IIIb -  Appears volume overloaded. Increase IV lasix to 80 mg BID Milrinone started yesterday   At 0.125 mcg/kg/min   COOX lower this AM   Urine output has not bumped   WIll increase to 0.25       Follow COOX and U>O.   - Continue digoxin 0.125 mg daily. - Continue spiro 25 mg daily. - Continue Farxiga 10 mg daily - Losartan held d/t soft BP - No BB with acute exacerbation   3. Mitral regurgitation - Moderate to severe ischemic MR on TEE.  - CABG and MVR as above - TCTS consulted    4. Aortic valve insufficiency - Mild to moderate AI   5. Pericardial effusion -  Moderate in size on echo, no respiratory variation or RV collapse to suggest tamponade   6. H/o AKI - Scr remains stable 1.0 today    7  GI   Pt with problems swallowing food     Never been addressed  ? Mild stricture.    Had TEE  on 5/8  No difficulties noted   7. Prediabetes - A1c 5.8 1/23 - Continue Farxiga       For questions or updates, please contact [facility] Please consult [email or web address] for contact info under        Signed, Dorris Carnes, MD  10/26/2021, 9:41 AM

## 2021-10-27 DIAGNOSIS — I5043 Acute on chronic combined systolic (congestive) and diastolic (congestive) heart failure: Secondary | ICD-10-CM | POA: Diagnosis not present

## 2021-10-27 LAB — COOXEMETRY PANEL
Carboxyhemoglobin: 1.3 % (ref 0.5–1.5)
Carboxyhemoglobin: 1.3 % (ref 0.5–1.5)
Methemoglobin: <0.7 % (ref 0.0–1.5)
Methemoglobin: <0.7 % (ref 0.0–1.5)
O2 Saturation: 69.5 %
O2 Saturation: 69.5 %
Total hemoglobin: 11.7 g/dL — ABNORMAL LOW (ref 12.0–16.0)
Total hemoglobin: 11.7 g/dL — ABNORMAL LOW (ref 12.0–16.0)

## 2021-10-27 LAB — BASIC METABOLIC PANEL
Anion gap: 8 (ref 5–15)
BUN: 37 mg/dL — ABNORMAL HIGH (ref 8–23)
CO2: 24 mmol/L (ref 22–32)
Calcium: 7.8 mg/dL — ABNORMAL LOW (ref 8.9–10.3)
Chloride: 100 mmol/L (ref 98–111)
Creatinine, Ser: 1.16 mg/dL (ref 0.61–1.24)
GFR, Estimated: >60 mL/min (ref >60)
Glucose, Bld: 120 mg/dL — ABNORMAL HIGH (ref 70–99)
Potassium: 3.6 mmol/L (ref 3.5–5.1)
Sodium: 132 mmol/L — ABNORMAL LOW (ref 135–145)

## 2021-10-27 LAB — CBC
HCT: 31.3 % — ABNORMAL LOW (ref 39.0–52.0)
Hemoglobin: 11.4 g/dL — ABNORMAL LOW (ref 13.0–17.0)
MCH: 30.9 pg (ref 26.0–34.0)
MCHC: 36.4 g/dL — ABNORMAL HIGH (ref 30.0–36.0)
MCV: 84.8 fL (ref 80.0–100.0)
Platelets: 279 10*3/uL (ref 150–400)
RBC: 3.69 MIL/uL — ABNORMAL LOW (ref 4.22–5.81)
RDW: 13.6 % (ref 11.5–15.5)
WBC: 7.9 10*3/uL (ref 4.0–10.5)
nRBC: 0 % (ref 0.0–0.2)

## 2021-10-27 LAB — HEPARIN LEVEL (UNFRACTIONATED): Heparin Unfractionated: 0.67 IU/mL (ref 0.30–0.70)

## 2021-10-27 LAB — GLUCOSE, CAPILLARY: Glucose-Capillary: 173 mg/dL — ABNORMAL HIGH (ref 70–99)

## 2021-10-27 NOTE — Progress Notes (Signed)
ANTICOAGULATION CONSULT NOTE - Initial Consult  Pharmacy Consult for heparin Indication: chest pain/ACS  No Known Allergies  Patient Measurements: Height: 6\' 1"  (185.4 cm) Weight: 97.1 kg (214 lb 1.1 oz) IBW/kg (Calculated) : 79.9 Heparin Dosing Weight: 98kg  Vital Signs: Temp: 97.8 F (36.6 C) (05/21 0455) Temp Source: Oral (05/21 0455) BP: 102/71 (05/21 0455) Pulse Rate: 98 (05/21 0455)  Labs: Recent Labs    10/24/21 2200 10/24/21 2200 10/25/21 0639 10/25/21 1904 10/26/21 0530 10/27/21 0500  HGB 13.2  --  12.4*  --  11.2* 11.4*  HCT 37.7*  --  34.7*  --  32.2* 31.3*  PLT 396  --  355  --  330 279  HEPARINUNFRC  --    < > 0.12* 0.30 0.44 0.67  CREATININE 0.96  --  1.00  --  1.09 1.16  TROPONINIHS 858*  --  825*  --   --   --    < > = values in this interval not displayed.     Estimated Creatinine Clearance: 68.6 mL/min (by C-G formula based on SCr of 1.16 mg/dL).   Medical History: Past Medical History:  Diagnosis Date   Arthritis    Glaucoma     Medications:  Medications Prior to Admission  Medication Sig Dispense Refill Last Dose   albuterol (VENTOLIN HFA) 108 (90 Base) MCG/ACT inhaler Inhale 2 puffs into the lungs every 6 (six) hours as needed for wheezing or shortness of breath. 8 g 0 Past Week   aspirin 81 MG EC tablet Take 1 tablet (81 mg total) by mouth daily. Swallow whole. 30 tablet 1 10/23/2021   atorvastatin (LIPITOR) 80 MG tablet Take 1 tablet (80 mg total) by mouth daily. 30 tablet 1 10/23/2021   dapagliflozin propanediol (FARXIGA) 10 MG TABS tablet Take 1 tablet (10 mg total) by mouth daily. 30 tablet 1 10/23/2021   digoxin (LANOXIN) 0.125 MG tablet Take 1 tablet (0.125 mg total) by mouth daily. 30 tablet 1 10/23/2021   escitalopram (LEXAPRO) 10 MG tablet Take 1 tablet (10 mg total) by mouth daily. 30 tablet 0 10/23/2021   esomeprazole (NEXIUM) 40 MG capsule Take 40 mg by mouth daily at 12 noon.   10/23/2021   Fluticasone-Umeclidin-Vilant (TRELEGY  ELLIPTA) 100-62.5-25 MCG/ACT AEPB Inhale 1 puff into the lungs daily. 2 each 0 Past Week   furosemide (LASIX) 20 MG tablet Take 1 tablet (20 mg total) by mouth daily. As needed for weight gain > 3lb in a day or 5 lb in a week (Patient taking differently: Take 20 mg by mouth daily.) 30 tablet 6 10/23/2021   gabapentin (NEURONTIN) 300 MG capsule Take 1 capsule (300 mg total) by mouth at bedtime. 90 capsule 0 10/23/2021   latanoprost (XALATAN) 0.005 % ophthalmic solution Place 1 drop into both eyes at bedtime.   10/23/2021   losartan (COZAAR) 25 MG tablet Take 0.5 tablets (12.5 mg total) by mouth at bedtime. 15 tablet 1 10/23/2021   nitroGLYCERIN (NITROSTAT) 0.4 MG SL tablet Place 1 tablet (0.4 mg total) under the tongue every 5 (five) minutes as needed for chest pain. 25 tablet 3 unknown   OVER THE COUNTER MEDICATION Apply 1 application. topically daily as needed (For back pain). Medication: Magnilife Cream   unknown   potassium chloride SA (KLOR-CON M) 20 MEQ tablet Take 1 tablet (20 mEq total) by mouth daily. Take 1 tablet as needed when taking furosemide 30 tablet 5 10/23/2021   spironolactone (ALDACTONE) 25 MG tablet Take 1 tablet (  25 mg total) by mouth daily. 30 tablet 1 10/23/2021   Scheduled:   aspirin EC  81 mg Oral Daily   atorvastatin  80 mg Oral Daily   Chlorhexidine Gluconate Cloth  6 each Topical Daily   dapagliflozin propanediol  10 mg Oral Daily   digoxin  0.125 mg Oral Daily   escitalopram  10 mg Oral Daily   feeding supplement  237 mL Oral BID BM   furosemide  80 mg Intravenous BID   gabapentin  300 mg Oral QHS   latanoprost  1 drop Both Eyes QHS   pantoprazole  80 mg Oral Q1200   sodium chloride flush  10-40 mL Intracatheter Q12H   sodium chloride flush  3 mL Intravenous Q12H   spironolactone  25 mg Oral Daily   Infusions:   sodium chloride     heparin 2,000 Units/hr (10/27/21 0315)   milrinone 0.25 mcg/kg/min (10/26/21 2248)    Assessment: Pt with a hx of CAD who was  admitted with CP. No anticoagulation PTA. Pharmacy consulted for heparin dosing. Patient may be a candidate for CABG/MVR vs. PCI LAD and mTEER.   Most recent heparin level continues to be therapeutic at 0.67 on heparin 2000 units/hr. CBC stable/ WNL.   Goal of Therapy:  Heparin level 0.3-0.7 units/ml Monitor platelets by anticoagulation protocol: Yes   Plan:  Empirically decrease to heparin 1950 units/hr to ensure heparin level stays within goal Daily heparin level, CBC Monitor for s/sx of bleeding F/U surgery plans   Thank you for including pharmacy in the care of this patient.  Zenaida Deed, PharmD PGY1 Acute Care Pharmacy Resident  Phone: [phone] 10/27/2021  7:50 AM  Please check [email or web address] for unit-specific pharmacy phone numbers.

## 2021-10-27 NOTE — Progress Notes (Signed)
Progress Note  Patient Name: Keith Jenkins Date of Encounter: 10/27/2021  [facility] Cardiologist:DB  Subjective  Patient says his breathing is some better      Inpatient Medications    Scheduled Meds:  aspirin EC  81 mg Oral Daily   atorvastatin  80 mg Oral Daily   Chlorhexidine Gluconate Cloth  6 each Topical Daily   dapagliflozin propanediol  10 mg Oral Daily   digoxin  0.125 mg Oral Daily   escitalopram  10 mg Oral Daily   feeding supplement  237 mL Oral BID BM   furosemide  80 mg Intravenous BID   gabapentin  300 mg Oral QHS   latanoprost  1 drop Both Eyes QHS   pantoprazole  80 mg Oral Q1200   sodium chloride flush  10-40 mL Intracatheter Q12H   sodium chloride flush  3 mL Intravenous Q12H   spironolactone  25 mg Oral Daily   Continuous Infusions:  sodium chloride     heparin 1,950 Units/hr (10/27/21 0914)   milrinone 0.2512 mcg/kg/min (10/27/21 0856)   PRN Meds: sodium chloride, acetaminophen, albuterol, guaiFENesin-dextromethorphan, loperamide, nitroGLYCERIN, ondansetron (ZOFRAN) IV, sodium chloride flush, sodium chloride flush, zolpidem   Vital Signs    Vitals:   10/26/21 2328 10/27/21 0455 10/27/21 0700 10/27/21 0757  BP: 98/71 102/71  (!) 110/52  Pulse: (!) 102 98  (!) 103  Resp: (!) 25 16  20   Temp: 98.5 F (36.9 C) 97.8 F (36.6 C)  97.7 F (36.5 C)  TempSrc: Oral Oral  Oral  SpO2: 96% 96%  97%  Weight:   97.1 kg   Height:        Intake/Output Summary (Last 24 hours) at 10/27/2021 1118 Last data filed at 10/27/2021 0908 Gross per 24 hour  Intake 698.34 ml  Output 3000 ml  Net -2301.66 ml   Net neg 2 L       10/27/2021    7:00 AM 10/26/2021   12:47 AM 10/25/2021    8:00 AM  Last 3 Weights  Weight (lbs) 214 lb 1.1 oz 215 lb 14.4 oz 216 lb 7.9 oz  Weight (kg) 97.1 kg 97.932 kg 98.2 kg      Telemetry    SR  - Personally Reviewed  ECG    No new  - Personally Reviewed  Physical Exam   GEN: No acute distress.   Neck:  JVP is  increased    Cardiac: RRR  III/VI systolic murmur  Respiratory: Clear to auscultation bilaterally. GI: Soft, nontender, non-distended  MS:  Tr LE edema; No deformity. Neuro:  Nonfocal  Psych: Normal affect   Labs    High Sensitivity Troponin:   Recent Labs  Lab 10/24/21 2200 10/25/21 0639  TROPONINIHS 858* 825*     Chemistry Recent Labs  Lab 10/24/21 2200 10/25/21 0639 10/26/21 0530 10/27/21 0500  NA 130* 131* 133* 132*  K 4.4 4.4 4.2 3.6  CL 104 103 103 100  CO2 19* 20* 23 24  GLUCOSE 145* 136* 124* 120*  BUN 26* 29* 37* 37*  CREATININE 0.96 1.00 1.09 1.16  CALCIUM 8.2* 8.1* 8.0* 7.8*  MG 2.4  --   --   --   PROT 6.4*  --   --   --   ALBUMIN 2.7*  --   --   --   AST 21  --   --   --   ALT 26  --   --   --   [facility]  63  --   --   --   BILITOT 0.9  --   --   --   GFRNONAA >60 >60 >60 >60  ANIONGAP 7 8 7 8     Lipids No results for input(s): CHOL, TRIG, HDL, LABVLDL, LDLCALC, CHOLHDL in the last 168 hours.  Hematology Recent Labs  Lab 10/25/21 0639 10/26/21 0530 10/27/21 0500  WBC 10.9* 9.7 7.9  RBC 4.09* 3.77* 3.69*  HGB 12.4* 11.2* 11.4*  HCT 34.7* 32.2* 31.3*  MCV 84.8 85.4 84.8  MCH 30.3 29.7 30.9  MCHC 35.7 34.8 36.4*  RDW 13.9 13.7 13.6  PLT 355 330 279   Thyroid No results for input(s): TSH, FREET4 in the last 168 hours.  BNP Recent Labs  Lab 10/22/21 1623 10/24/21 2150  BNP 1,226.7* 1,306.1*    DDimer No results for input(s): DDIMER in the last 168 hours.   Radiology    [place] EKG SITE RITE  Result Date: 10/25/2021 If Site Rite image not attached, placement could not be confirmed due to current cardiac rhythm.   Cardiac Studies     Patient Profile       76 y.o. male with hx COPD, prediabetes, HLD, GERD. Recent admit earlier this month with later presentation inferior STEMI and new systolic CHF w/ moderate to severe ischemic MR.   Readmitted with a/c CHF.    Assessment & Plan    1. CAD/recent late presentation inferior STEMI -  LHC 05/04: 100% m RCA and 90% m LAD.  - RCA managed medically d/t completed infarct.  - Echo (5/23) EF 25%, RV moderately reduced, moderate to severe MR - Initial plan for PCI to LAD but with moderate to severe ischemic MR has been seen by TCTS for consideration of CABG (LIMA to LAD)/MVR. Films reviewed with Dr. Excell Seltzer. LAD not ideal for PCI. Had planned for surgery in a few weeks once recovered from MI. - Now readmitted with a/c CHF and exertional CP with HS troponin [id number] - TCTS has been consulted to coordinate timing of surgery. Will need to optimize from HF standpoint. - Keep on heparin gtt + aspirin/statin    2. Acute on chronic systolic CHF/ICM - In setting of late presentation inferior STEMI 05/23 - Echo 05/23 EF 25-30%, RV moderately reduced, moderate to severe MR. - Limited echo on admit EF 30-35%, RV moderately reduced, MR not well evaluated, moderate pericardial effusion - NYHA IIIb  Pt is responding to IV lasix  He has diuresed since milrinone was increased to 0.25. Continue current regimen of IV lasix 80 bid Continue milrinonemg daily. - Continue spiro 25 mg daily. - Continue Farxiga 10 mg daily - Losartan held d/t soft BP - No BB with acute exacerbation   3. Mitral regurgitation - Moderate to severe ischemic MR on TEE.  - CABG and MVR as above - TCTS consulted    4. Aortic valve insufficiency - Mild to moderate AI   5. Pericardial effusion - Moderate in size on echo, no respiratory variation or RV collapse to suggest tamponade   6. H/o AKI - Scr remains stable 1.16 today    7  GI   Pt with problems swallowing food     Never been addressed  ? Mild stricture.    Had TEE  on 5/8  No difficulties noted   7. Prediabetes - A1c 5.8 1/23 - Continue Farxiga       For questions or updates, please contact [facility] Please consult [email or web address] for contact info  under        Signed, Dietrich Pates, MD  10/27/2021, 11:18 AM

## 2021-10-27 NOTE — Progress Notes (Signed)
   10/27/21 1705  Assess: MEWS Score  BP 90/66  Pulse Rate (!) 103  ECG Heart Rate (!) 103  Resp 19  SpO2 95 %  Assess: MEWS Score  MEWS Temp 0  MEWS Systolic 1  MEWS Pulse 1  MEWS RR 0  MEWS LOC 0  MEWS Score 2  MEWS Score Color Yellow  Assess: if the MEWS score is Yellow or Red  Were vital signs taken at a resting state? Yes  Focused Assessment No change from prior assessment  Early Detection of Sepsis Score *See Row Information* Low  MEWS guidelines implemented *See Row Information* No, previously yellow, continue vital signs every 4 hours     MD notified.

## 2021-10-27 NOTE — Progress Notes (Signed)
Pharmacist Heart Failure Core Measure Documentation  Assessment: Keith Jenkins has an EF documented as 30-35% on 10/24/21 by ECHO.  Rationale: Heart failure patients with left ventricular systolic dysfunction (LVSD) and an EF < 40% should be prescribed an angiotensin converting enzyme inhibitor (ACEI) or angiotensin receptor blocker (ARB) at discharge unless a contraindication is documented in the medical record.  This patient is not currently on an ACEI or ARB for HF.  This note is being placed in the record in order to provide documentation that a contraindication to the use of these agents is present for this encounter.  ACE Inhibitor or Angiotensin Receptor Blocker is contraindicated (specify all that apply)  []   ACEI allergy AND ARB allergy []   Angioedema []   Moderate or severe aortic stenosis []   Hyperkalemia [x]   Hypotension []   Renal artery stenosis []   Worsening renal function, preexisting renal disease or dysfunction  Plans to resume losartan as hypotension resolves.   Donald Pore 10/27/2021 2:31 PM

## 2021-10-28 ENCOUNTER — Other Ambulatory Visit (HOSPITAL_COMMUNITY): Payer: Medicare HMO

## 2021-10-28 DIAGNOSIS — I5043 Acute on chronic combined systolic (congestive) and diastolic (congestive) heart failure: Secondary | ICD-10-CM | POA: Diagnosis not present

## 2021-10-28 LAB — BASIC METABOLIC PANEL
Anion gap: 7 (ref 5–15)
BUN: 32 mg/dL — ABNORMAL HIGH (ref 8–23)
CO2: 27 mmol/L (ref 22–32)
Calcium: 8 mg/dL — ABNORMAL LOW (ref 8.9–10.3)
Chloride: 97 mmol/L — ABNORMAL LOW (ref 98–111)
Creatinine, Ser: 1.33 mg/dL — ABNORMAL HIGH (ref 0.61–1.24)
GFR, Estimated: 56 mL/min — ABNORMAL LOW (ref >60)
Glucose, Bld: 110 mg/dL — ABNORMAL HIGH (ref 70–99)
Potassium: 3.7 mmol/L (ref 3.5–5.1)
Sodium: 131 mmol/L — ABNORMAL LOW (ref 135–145)

## 2021-10-28 LAB — HEPARIN LEVEL (UNFRACTIONATED): Heparin Unfractionated: 0.45 IU/mL (ref 0.30–0.70)

## 2021-10-28 LAB — COOXEMETRY PANEL
Carboxyhemoglobin: 2 % — ABNORMAL HIGH (ref 0.5–1.5)
Methemoglobin: <0.7 % (ref 0.0–1.5)
O2 Saturation: 70.3 %
Total hemoglobin: 11.4 g/dL — ABNORMAL LOW (ref 12.0–16.0)

## 2021-10-28 LAB — CBC
HCT: 31.9 % — ABNORMAL LOW (ref 39.0–52.0)
Hemoglobin: 11.1 g/dL — ABNORMAL LOW (ref 13.0–17.0)
MCH: 29.7 pg (ref 26.0–34.0)
MCHC: 34.8 g/dL (ref 30.0–36.0)
MCV: 85.3 fL (ref 80.0–100.0)
Platelets: 291 10*3/uL (ref 150–400)
RBC: 3.74 MIL/uL — ABNORMAL LOW (ref 4.22–5.81)
RDW: 13.5 % (ref 11.5–15.5)
WBC: 7.1 10*3/uL (ref 4.0–10.5)
nRBC: 0 % (ref 0.0–0.2)

## 2021-10-28 MED ORDER — POTASSIUM CHLORIDE CRYS ER 20 MEQ PO TBCR
40.0000 meq | EXTENDED_RELEASE_TABLET | Freq: Two times a day (BID) | ORAL | Status: AC
  Administered 2021-10-28 (×2): 40 meq via ORAL
  Filled 2021-10-28 (×2): qty 2

## 2021-10-28 MED ORDER — METOLAZONE 2.5 MG PO TABS
2.5000 mg | ORAL_TABLET | Freq: Once | ORAL | Status: AC
  Administered 2021-10-28: 2.5 mg via ORAL
  Filled 2021-10-28: qty 1

## 2021-10-28 MED ORDER — AMIODARONE LOAD VIA INFUSION
150.0000 mg | Freq: Once | INTRAVENOUS | Status: AC
  Administered 2021-10-28: 150 mg via INTRAVENOUS
  Filled 2021-10-28: qty 83.34

## 2021-10-28 MED ORDER — HEPARIN (PORCINE) 25000 UT/250ML-% IV SOLN
1600.0000 [IU]/h | INTRAVENOUS | Status: DC
  Administered 2021-10-28 – 2021-10-30 (×3): 1950 [IU]/h via INTRAVENOUS
  Administered 2021-10-30 – 2021-11-01 (×3): 1600 [IU]/h via INTRAVENOUS
  Filled 2021-10-28 (×6): qty 250

## 2021-10-28 MED ORDER — AMIODARONE HCL IN DEXTROSE 360-4.14 MG/200ML-% IV SOLN
30.0000 mg/h | INTRAVENOUS | Status: DC
  Administered 2021-10-28 – 2021-10-31 (×8): 30 mg/h via INTRAVENOUS
  Filled 2021-10-28 (×7): qty 200

## 2021-10-28 MED ORDER — AMIODARONE HCL IN DEXTROSE 360-4.14 MG/200ML-% IV SOLN
60.0000 mg/h | INTRAVENOUS | Status: AC
  Administered 2021-10-28 (×2): 60 mg/h via INTRAVENOUS
  Filled 2021-10-28: qty 200

## 2021-10-28 NOTE — Progress Notes (Addendum)
Advanced Heart Failure Rounding Note  PCP-Cardiologist: None   Subjective:    05/19: Admit with a/c CHF. CO-OX okay but appeared low-output >>started milrinone 0.25   CO-OX 70% on milrinone 0.25  CVP 12. 2.7L UOP yesterday but negative only 300 cc for the day. Continues on IV lasix 80 BID.  Scr 1.09>1.16>1.33  SBP 90s-100s  Dyspnea improved. No longer having orthopnea or PND. Legs still feel puffy. Appetite okay.   Objective:   Weight Range: 96.1 kg Body mass index is 27.96 kg/m.   Vital Signs:   Temp:  [97.7 F (36.5 C)-98.6 F (37 C)] 98.6 F (37 C) (05/22 0453) Pulse Rate:  [56-109] 90 (05/22 0453) Resp:  [12-37] 23 (05/22 0453) BP: (87-110)/(52-74) 104/73 (05/22 0453) SpO2:  [90 %-100 %] 95 % (05/22 0453) Weight:  [96.1 kg] 96.1 kg (05/22 0500) Last BM Date : 10/27/21  Weight change: Filed Weights   10/26/21 0047 10/27/21 0700 10/28/21 0500  Weight: 97.9 kg 97.1 kg 96.1 kg    Intake/Output:   Intake/Output Summary (Last 24 hours) at 10/28/2021 0737 Last data filed at 10/28/2021 0651 Gross per 24 hour  Intake 2453.94 ml  Output 2745 ml  Net -291.06 ml      Physical Exam    General:  No distress. Lying comfortably in bed. HEENT: Normal Neck: Supple. JVP to jaw. Carotids 2+ bilat; no bruits.  Cor: PMI nondisplaced. Irregular rate & rhythm. No rubs, gallops, 3/6 MR murmur Lungs: Clear Abdomen: Soft, nontender, nondistended. No hepatosplenomegaly. Extremities: No cyanosis, clubbing, rash, 1-2+ edema Neuro: Alert & orientedx3, cranial nerves grossly intact. moves all 4 extremities w/o difficulty. Affect pleasant   Telemetry   Looks like SR with Pvcs and PACs, ? Different P wave morphologies  EKG    No new  Labs    CBC Recent Labs    10/27/21 0500 10/28/21 0455  WBC 7.9 7.1  HGB 11.4* 11.1*  HCT 31.3* 31.9*  MCV 84.8 85.3  PLT 279 291   Basic Metabolic Panel Recent Labs    32/99/24 0500 10/28/21 0455  NA 132* 131*  K 3.6  3.7  CL 100 97*  CO2 24 27  GLUCOSE 120* 110*  BUN 37* 32*  CREATININE 1.16 1.33*  CALCIUM 7.8* 8.0*   Liver Function Tests No results for input(s): AST, ALT, ALKPHOS, BILITOT, PROT, ALBUMIN in the last 72 hours. No results for input(s): LIPASE, AMYLASE in the last 72 hours. Cardiac Enzymes No results for input(s): CKTOTAL, CKMB, CKMBINDEX, TROPONINI in the last 72 hours.  BNP: BNP (last 3 results) Recent Labs    10/10/21 1606 10/22/21 1623 10/24/21 2150  BNP 1,564.6* 1,226.7* 1,306.1*    ProBNP (last 3 results) No results for input(s): PROBNP in the last 8760 hours.   D-Dimer No results for input(s): DDIMER in the last 72 hours. Hemoglobin A1C No results for input(s): HGBA1C in the last 72 hours. Fasting Lipid Panel No results for input(s): CHOL, HDL, LDLCALC, TRIG, CHOLHDL, LDLDIRECT in the last 72 hours. Thyroid Function Tests No results for input(s): TSH, T4TOTAL, T3FREE, THYROIDAB in the last 72 hours.  Invalid input(s): FREET3  Other results:   Imaging    No results found.   Medications:     Scheduled Medications:  aspirin EC  81 mg Oral Daily   atorvastatin  80 mg Oral Daily   Chlorhexidine Gluconate Cloth  6 each Topical Daily   dapagliflozin propanediol  10 mg Oral Daily   digoxin  0.125  mg Oral Daily   escitalopram  10 mg Oral Daily   feeding supplement  237 mL Oral BID BM   furosemide  80 mg Intravenous BID   gabapentin  300 mg Oral QHS   latanoprost  1 drop Both Eyes QHS   pantoprazole  80 mg Oral Q1200   sodium chloride flush  10-40 mL Intracatheter Q12H   sodium chloride flush  3 mL Intravenous Q12H   spironolactone  25 mg Oral Daily    Infusions:  sodium chloride     heparin 1,950 Units/hr (10/28/21 0651)   milrinone 0.2512 mcg/kg/min (10/28/21 0651)    PRN Medications: sodium chloride, acetaminophen, albuterol, guaiFENesin-dextromethorphan, loperamide, nitroGLYCERIN, ondansetron (ZOFRAN) IV, sodium chloride flush, sodium  chloride flush, zolpidem    Patient Profile   76 y.o. male with hx COPD, prediabetes, HLD, GERD. Recent admit earlier this month with later presentation inferior STEMI and new systolic CHF w/ moderate to severe ischemic MR.   Readmitted with a/c CHF.  Assessment/Plan   1. CAD/recent late presentation inferior STEMI - LHC 05/04: 100% m RCA and 90% m LAD.  - RCA managed medically d/t completed infarct.  - Echo (5/23) EF 25%, RV moderately reduced, moderate to severe MR - Initial plan for PCI to LAD but with moderate to severe ischemic MR has been seen by TCTS for consideration of CABG (LIMA to LAD)/MVR. Films reviewed with Dr. Burt Knack. LAD not ideal for PCI. Had planned for surgery in a few weeks once recovered from MI. However, now readmitted with a/c CHF. TCTS consulted.  - Exertional CP on admit with HS troponin [id number]. ? D/t demand ischemia from CHF or down trending from recent MI. - Continues on heparin gtt, stop today. No longer having CP. - Continue aspirin and statin. No beta blocker with suspected low-output.   2. Acute on chronic systolic CHF with low-output/ICM - In setting of late presentation inferior STEMI 05/23 - Echo 05/23 EF 25-30%, RV moderately reduced, moderate to severe MR. - Limited echo on admit EF 30-35%, RV moderately reduced, MR not well evaluated, moderate pericardial effusion - NYHA IIIb -  CO-OX okay on admit but appeared low-output. Now on milrinone 0.25.  - CO-OX 70% on milrinone 0.25. Continue milrinone while diuresing. - CVP 12. On IV lasix 80 BID. Give metolazone 2.5 once today. - Watch renal function. Supp K aggressively. - No BB with suspected low-output - Continue digoxin 0.125 mg daily. - Continue spiro 25 mg daily. - Continue Farxiga 10 mg daily - Losartan held d/t soft BP - No BP room to titrate GDMT - Rhythm looks like sinus with PACs and PVCs. ? Different P wave morphologies. Check ECG.   3. Mitral regurgitation - Moderate to severe  ischemic MR on TEE.  - considering CABG and MVR as above. TCTS consulted. - may need to consider mTEER if too tenuous for surgery   4. Aortic valve insufficiency - Mild to moderate AI - Will need to follow.   5. Pericardial effusion - Moderate in size on echo, no respiratory variation or RV collapse to suggest tamponade   6. H/o AKI - Scr stable 1.0>1.16>1.33 - Watch renal function with diuresis   7. Prediabetes - A1c 5.8 1/23 - Continue Farxiga      Length of Stay: 4  FINCH, LINDSAY N, PA-C  10/28/2021, 7:37 AM  Advanced Heart Failure Team Pager [phone] (M-F; 7a - 5p)  Please contact [facility] for night-coverage after hours (5p -7a ) and weekends on  [email or web address]  Patient seen and examined with the above-signed Advanced Practice Provider and/or Housestaff. I personally reviewed laboratory data, imaging studies and relevant notes. I independently examined the patient and formulated the important aspects of the plan. I have edited the note to reflect any of my changes or salient points. I have personally discussed the plan with the patient and/or family.  Diuresing modestly on milrinone and IV lasix. Weight down about 6 pounds since admit. Breathing better. No longer having orthopnea or PND. No further CP. Still with some edema.    General:  Weak appearing. No resp difficulty HEENT: normal Neck: supple. JVP to jaw Carotids 2+ bilat; no bruits. No lymphadenopathy or thryomegaly appreciated. Cor: PMI nondisplaced. Irregular tachy 2/6 MR Lungs: clear Abdomen: soft, nontender, nondistended. No hepatosplenomegaly. No bruits or masses. Good bowel sounds. Extremities: no cyanosis, clubbing, rash, 1+ edema Neuro: alert & orientedx3, cranial nerves grossly intact. moves all 4 extremities w/o difficulty. Affect pleasant  Continue milrinone and IV diuresis. Agree with metolazone today. Watch renal function. Needs ongoing PT/OT/ambulation.   He is in AFL today. Continue heparin.  Start IV amio.  TCTS to see.   Glori Bickers, MD  8:18 AM

## 2021-10-28 NOTE — Care Management Important Message (Signed)
Important Message  Patient Details  Name: Keith Jenkins MRN: [id number] Date of Birth: 09-19-1945   Medicare Important Message Given:  Yes     [name] 10/28/2021, 7:42 AM

## 2021-10-28 NOTE — Progress Notes (Signed)
ANTICOAGULATION CONSULT NOTE - Initial Consult  Pharmacy Consult for heparin Indication: chest pain/ACS/aflutter  No Known Allergies  Patient Measurements: Height: 6\' 1"  (185.4 cm) Weight: 96.1 kg (211 lb 14.4 oz) IBW/kg (Calculated) : 79.9 Heparin Dosing Weight: 98kg  Vital Signs: Temp: 98.4 F (36.9 C) (05/22 1121) Temp Source: Oral (05/22 1100) BP: 98/70 (05/22 1121) Pulse Rate: 118 (05/22 1121)  Labs: Recent Labs    10/26/21 0530 10/27/21 0500 10/28/21 0455  HGB 11.2* 11.4* 11.1*  HCT 32.2* 31.3* 31.9*  PLT 330 279 291  HEPARINUNFRC 0.44 0.67 0.45  CREATININE 1.09 1.16 1.33*     Estimated Creatinine Clearance: 59.5 mL/min (A) (by C-G formula based on SCr of 1.33 mg/dL (H)).   Medical History: Past Medical History:  Diagnosis Date   Arthritis    Glaucoma     Medications:  Medications Prior to Admission  Medication Sig Dispense Refill Last Dose   albuterol (VENTOLIN HFA) 108 (90 Base) MCG/ACT inhaler Inhale 2 puffs into the lungs every 6 (six) hours as needed for wheezing or shortness of breath. 8 g 0 Past Week   aspirin 81 MG EC tablet Take 1 tablet (81 mg total) by mouth daily. Swallow whole. 30 tablet 1 10/23/2021   atorvastatin (LIPITOR) 80 MG tablet Take 1 tablet (80 mg total) by mouth daily. 30 tablet 1 10/23/2021   dapagliflozin propanediol (FARXIGA) 10 MG TABS tablet Take 1 tablet (10 mg total) by mouth daily. 30 tablet 1 10/23/2021   digoxin (LANOXIN) 0.125 MG tablet Take 1 tablet (0.125 mg total) by mouth daily. 30 tablet 1 10/23/2021   escitalopram (LEXAPRO) 10 MG tablet Take 1 tablet (10 mg total) by mouth daily. 30 tablet 0 10/23/2021   esomeprazole (NEXIUM) 40 MG capsule Take 40 mg by mouth daily at 12 noon.   10/23/2021   Fluticasone-Umeclidin-Vilant (TRELEGY ELLIPTA) 100-62.5-25 MCG/ACT AEPB Inhale 1 puff into the lungs daily. 2 each 0 Past Week   furosemide (LASIX) 20 MG tablet Take 1 tablet (20 mg total) by mouth daily. As needed for weight gain >  3lb in a day or 5 lb in a week (Patient taking differently: Take 20 mg by mouth daily.) 30 tablet 6 10/23/2021   gabapentin (NEURONTIN) 300 MG capsule Take 1 capsule (300 mg total) by mouth at bedtime. 90 capsule 0 10/23/2021   latanoprost (XALATAN) 0.005 % ophthalmic solution Place 1 drop into both eyes at bedtime.   10/23/2021   losartan (COZAAR) 25 MG tablet Take 0.5 tablets (12.5 mg total) by mouth at bedtime. 15 tablet 1 10/23/2021   nitroGLYCERIN (NITROSTAT) 0.4 MG SL tablet Place 1 tablet (0.4 mg total) under the tongue every 5 (five) minutes as needed for chest pain. 25 tablet 3 unknown   OVER THE COUNTER MEDICATION Apply 1 application. topically daily as needed (For back pain). Medication: Magnilife Cream   unknown   potassium chloride SA (KLOR-CON M) 20 MEQ tablet Take 1 tablet (20 mEq total) by mouth daily. Take 1 tablet as needed when taking furosemide 30 tablet 5 10/23/2021   spironolactone (ALDACTONE) 25 MG tablet Take 1 tablet (25 mg total) by mouth daily. 30 tablet 1 10/23/2021   Scheduled:   aspirin EC  81 mg Oral Daily   atorvastatin  80 mg Oral Daily   Chlorhexidine Gluconate Cloth  6 each Topical Daily   dapagliflozin propanediol  10 mg Oral Daily   digoxin  0.125 mg Oral Daily   escitalopram  10 mg Oral Daily  feeding supplement  237 mL Oral BID BM   furosemide  80 mg Intravenous BID   gabapentin  300 mg Oral QHS   latanoprost  1 drop Both Eyes QHS   pantoprazole  80 mg Oral Q1200   potassium chloride  40 mEq Oral BID   sodium chloride flush  10-40 mL Intracatheter Q12H   sodium chloride flush  3 mL Intravenous Q12H   spironolactone  25 mg Oral Daily   Infusions:   sodium chloride     amiodarone 60 mg/hr (10/28/21 1358)   Followed by   amiodarone     heparin 1,950 Units/hr (10/28/21 1024)   milrinone 0.2512 mcg/kg/min (10/28/21 0858)    Assessment: Pt with a hx of CAD who was admitted with CP. No anticoagulation PTA. Pharmacy consulted for heparin dosing. Patient  may be a candidate for CABG/MVR vs. PCI LAD and mTEER.   Heparin level therapeutic at 0.45 on heparin 1950 units/hr. CBC stable/ WNL.  Started amiodarone this am for new aflutter   Goal of Therapy:  Heparin level 0.3-0.7 units/ml Monitor platelets by anticoagulation protocol: Yes   Plan:  Heparin 1950 units/hr Daily heparin level, CBC Monitor for s/sx of bleeding F/U surgery plans     Bonnita Nasuti Pharm.D. CPP, BCPS Clinical Pharmacist [phone] 10/28/2021 2:36 PM    Please check [email or web address] for unit-specific pharmacy phone numbers.

## 2021-10-28 NOTE — Progress Notes (Signed)
Orders received for heparin drip and amiodarone drip. Heparin and Amio drips transfusing. VSS, Will continue to closely monitor pt. Delia Heady RN

## 2021-10-28 NOTE — Progress Notes (Signed)
CARDIAC REHAB PHASE I   Preop education completed with pt and and family. Pt educated on importance of ambulation, IS use, and sternal precautions postop. Pt given cardiac surgery booklet, along with [facility] care guide and in-the-tube sheet. Pt stating fatigue after this mornings Aflutter episode. Will return tomorrow to encourage ambulation.  [id number] Reynold Bowen, RN BSN 10/28/2021 2:01 PM

## 2021-10-28 NOTE — Progress Notes (Signed)
CARDIAC REHAB PHASE I   Went to offer to walk with pt. Pts HR 100s-120s Afib. Pt feels some palpitations and SOB, state he's been in it for a couple of hours. Began preop education with pt and wife. Pt denies questions or concerns at this time. Will continue to follow pt throughout their hospital stay, and f/u as appropriate to ambulate.  [id number] Reynold Bowen, RN BSN 10/28/2021 11:08 AM

## 2021-10-29 ENCOUNTER — Encounter (HOSPITAL_COMMUNITY): Payer: Self-pay | Admitting: Internal Medicine

## 2021-10-29 ENCOUNTER — Encounter (HOSPITAL_COMMUNITY): Payer: Self-pay | Admitting: Cardiology

## 2021-10-29 ENCOUNTER — Inpatient Hospital Stay (HOSPITAL_COMMUNITY): Payer: Medicare HMO

## 2021-10-29 ENCOUNTER — Other Ambulatory Visit (HOSPITAL_COMMUNITY): Payer: Medicare HMO

## 2021-10-29 DIAGNOSIS — I5043 Acute on chronic combined systolic (congestive) and diastolic (congestive) heart failure: Secondary | ICD-10-CM | POA: Diagnosis not present

## 2021-10-29 LAB — BASIC METABOLIC PANEL
Anion gap: 10 (ref 5–15)
BUN: 33 mg/dL — ABNORMAL HIGH (ref 8–23)
CO2: 27 mmol/L (ref 22–32)
Calcium: 8.6 mg/dL — ABNORMAL LOW (ref 8.9–10.3)
Chloride: 94 mmol/L — ABNORMAL LOW (ref 98–111)
Creatinine, Ser: 1.42 mg/dL — ABNORMAL HIGH (ref 0.61–1.24)
GFR, Estimated: 52 mL/min — ABNORMAL LOW (ref >60)
Glucose, Bld: 136 mg/dL — ABNORMAL HIGH (ref 70–99)
Potassium: 4.4 mmol/L (ref 3.5–5.1)
Sodium: 131 mmol/L — ABNORMAL LOW (ref 135–145)

## 2021-10-29 LAB — CBC
HCT: 34.9 % — ABNORMAL LOW (ref 39.0–52.0)
Hemoglobin: 12.2 g/dL — ABNORMAL LOW (ref 13.0–17.0)
MCH: 29.7 pg (ref 26.0–34.0)
MCHC: 35 g/dL (ref 30.0–36.0)
MCV: 84.9 fL (ref 80.0–100.0)
Platelets: 319 10*3/uL (ref 150–400)
RBC: 4.11 MIL/uL — ABNORMAL LOW (ref 4.22–5.81)
RDW: 13.4 % (ref 11.5–15.5)
WBC: 9.1 10*3/uL (ref 4.0–10.5)
nRBC: 0 % (ref 0.0–0.2)

## 2021-10-29 LAB — COOXEMETRY PANEL
Carboxyhemoglobin: 1.8 % — ABNORMAL HIGH (ref 0.5–1.5)
Methemoglobin: <0.7 % (ref 0.0–1.5)
O2 Saturation: 64.1 %
Total hemoglobin: 12.7 g/dL (ref 12.0–16.0)

## 2021-10-29 LAB — HEPARIN LEVEL (UNFRACTIONATED): Heparin Unfractionated: 0.64 IU/mL (ref 0.30–0.70)

## 2021-10-29 LAB — TROPONIN I (HIGH SENSITIVITY)
Troponin I (High Sensitivity): 422 ng/L (ref <18)
Troponin I (High Sensitivity): 395 ng/L (ref <18)

## 2021-10-29 LAB — PROCALCITONIN: Procalcitonin: <0.1 ng/mL

## 2021-10-29 MED ORDER — POTASSIUM CHLORIDE CRYS ER 20 MEQ PO TBCR
40.0000 meq | EXTENDED_RELEASE_TABLET | Freq: Once | ORAL | Status: AC
  Administered 2021-10-29: 40 meq via ORAL
  Filled 2021-10-29: qty 2

## 2021-10-29 MED ORDER — ISOSORBIDE MONONITRATE ER 60 MG PO TB24
60.0000 mg | ORAL_TABLET | Freq: Every day | ORAL | Status: DC
  Administered 2021-10-29 – 2021-10-31 (×3): 60 mg via ORAL
  Filled 2021-10-29 (×3): qty 1

## 2021-10-29 MED ORDER — FUROSEMIDE 10 MG/ML IJ SOLN
80.0000 mg | Freq: Two times a day (BID) | INTRAMUSCULAR | Status: DC
  Administered 2021-10-29: 80 mg via INTRAVENOUS
  Filled 2021-10-29: qty 8

## 2021-10-29 MED ORDER — MORPHINE SULFATE (PF) 2 MG/ML IV SOLN
2.0000 mg | INTRAVENOUS | Status: DC | PRN
Start: 2021-10-29 — End: 2021-11-01
  Administered 2021-10-29: 2 mg via INTRAVENOUS
  Filled 2021-10-29: qty 1

## 2021-10-29 MED ORDER — FUROSEMIDE 10 MG/ML IJ SOLN: 80.0000 mg | Freq: Every day | INTRAMUSCULAR | Status: DC

## 2021-10-29 NOTE — Progress Notes (Signed)
[facility] for heparin Indication: chest pain/ACS/aflutter  No Known Allergies  Patient Measurements: Height: 6\' 1"  (185.4 cm) Weight: 96.1 kg (211 lb 14.4 oz) IBW/kg (Calculated) : 79.9 Heparin Dosing Weight: 98kg  Vital Signs: Temp: 98.6 F (37 C) (05/23 1100) Temp Source: Oral (05/23 1100) BP: 102/77 (05/23 1100) Pulse Rate: 86 (05/23 1100)  Labs: Recent Labs    10/27/21 0500 10/28/21 0455 10/29/21 0540 10/29/21 0739 10/29/21 0908  HGB 11.4* 11.1* 12.2*  --   --   HCT 31.3* 31.9* 34.9*  --   --   PLT 279 291 319  --   --   HEPARINUNFRC 0.67 0.45 0.64  --   --   CREATININE 1.16 1.33* 1.42*  --   --   TROPONINIHS  --   --   --  422* 395*     Estimated Creatinine Clearance: 55.8 mL/min (A) (by C-G formula based on SCr of 1.42 mg/dL (H)).   Medical History: Past Medical History:  Diagnosis Date   Arthritis    Glaucoma     Medications:   Scheduled:   aspirin EC  81 mg Oral Daily   atorvastatin  80 mg Oral Daily   Chlorhexidine Gluconate Cloth  6 each Topical Daily   dapagliflozin propanediol  10 mg Oral Daily   digoxin  0.125 mg Oral Daily   escitalopram  10 mg Oral Daily   feeding supplement  237 mL Oral BID BM   furosemide  80 mg Intravenous BID   gabapentin  300 mg Oral QHS   isosorbide mononitrate  60 mg Oral Daily   latanoprost  1 drop Both Eyes QHS   pantoprazole  80 mg Oral Q1200   sodium chloride flush  10-40 mL Intracatheter Q12H   sodium chloride flush  3 mL Intravenous Q12H   spironolactone  25 mg Oral Daily    Assessment: Pt with a hx of CAD who was admitted with CP. No anticoagulation PTA. Pharmacy consulted for heparin dosing. Patient may be a candidate for CABG/MVR vs. PCI LAD and mTEER.   Heparin level therapeutic at 0.64 on heparin 1950 units/hr. CBC stable.  No bleeding issues noted.   Goal of Therapy:  Heparin level 0.3-0.7 units/ml Monitor platelets by anticoagulation protocol: Yes    Plan:  Continue Heparin at 1950 units/hr Daily heparin level, CBC Monitor for s/sx of bleeding F/U surgery plans   Erin Hearing PharmD., BCPS Clinical Pharmacist 10/29/2021 1:33 PM

## 2021-10-29 NOTE — Progress Notes (Signed)
   10/29/21 0726  Assess: MEWS Score  Temp 98.4 F (36.9 C)  BP 107/70  Pulse Rate 91  ECG Heart Rate 91  Resp (!) 29  Level of Consciousness Alert  Assess: MEWS Score  MEWS Temp 0  MEWS Systolic 0  MEWS Pulse 0  MEWS RR 2  MEWS LOC 0  MEWS Score 2  MEWS Score Color Yellow  Assess: if the MEWS score is Yellow or Red  Were vital signs taken at a resting state? Yes  Focused Assessment Change from prior assessment (see assessment flowsheet)  Early Detection of Sepsis Score *See Row Information* Low  MEWS guidelines implemented *See Row Information* Yes   Patient complained of chest pain for the second time this morning. RAPID RESPONSE was called and new orders were given by AMY NP for cardiology and labs of troponin were drawn.

## 2021-10-29 NOTE — Progress Notes (Signed)
OT Cancellation Note  Patient Details Name: Keith Jenkins MRN: [id number] DOB: 21-Jun-1945   Cancelled Treatment:    Reason Eval/Treat Not Completed: Medical issues which prohibited therapy;Other (comment) (Pt with reported chest pain last evening and over night. Chart labs/troponin level reviewed. Pt/spouse requesting that pt rest at this time. Will re-attempt OT assessment as schedule allows later today or next available date.)  Alm Bustard, OTR/L 10/29/2021, 10:14 AM

## 2021-10-29 NOTE — Progress Notes (Signed)
Lab called. Questioned troponin ordered by Amy NP. Informed the lab tech that this was a new order placed by Amy NP at 0714 in response to a Rapid called on this patient.

## 2021-10-29 NOTE — Progress Notes (Signed)
PT Cancellation Note  Patient Details Name: Vincient Vanaman MRN: [id number] DOB: [birth year]   Cancelled Treatment:    Reason Eval/Treat Not Completed: Other (comment).  Pt is in bed and his wife requests PT not see him at this time.  Retry at another time.   Ivar Drape 10/29/2021, 11:27 AM  Samul Dada, PT PhD Acute Rehab Dept. Number: [facility] [id number] and [facility] [phone]

## 2021-10-29 NOTE — Progress Notes (Signed)
  Pro calcitonin pending.  HS Trop 422 down from previous 825.   Chest pressure resolved.    No change to continue to monitor.   Ezzie Senat NP-C  9:51 AM

## 2021-10-29 NOTE — Progress Notes (Addendum)
Advanced Heart Failure Rounding Note  PCP-Cardiologist: None   Subjective:    05/19: Admit with a/c CHF. CO-OX okay but appeared low-output >>started milrinone 0.25 05/22: Developed A flutter. Started on amio drip and continued on heparin drip. Diuresd with IV lasix + metolazone. Negative 1.9 liters.   Chest this morning x2. Given 2 Nitro around 0500 am and 3 Nitro around 7 am.   He is complaining of chest heaviness and increased shortness of breath.   Objective:   Weight Range: 96.1 kg Body mass index is 27.96 kg/m.   Vital Signs:   Temp:  [97.9 F (36.6 C)-98.7 F (37.1 C)] 98.7 F (37.1 C) (05/23 0551) Pulse Rate:  [87-124] 87 (05/23 0600) Resp:  [16-22] 18 (05/23 0600) BP: (96-105)/(66-79) 96/79 (05/23 0600) SpO2:  [92 %-97 %] 96 % (05/23 0600) Last BM Date : 10/28/21  Weight change: Filed Weights   10/26/21 0047 10/27/21 0700 10/28/21 0500  Weight: 97.9 kg 97.1 kg 96.1 kg    Intake/Output:   Intake/Output Summary (Last 24 hours) at 10/29/2021 0715 Last data filed at 10/29/2021 0343 Gross per 24 hour  Intake 1663.54 ml  Output 3625 ml  Net -1961.46 ml    CVP 5 personally checked   Physical Exam   General:  Appears weak.  HEENT: normal Neck: supple. JVP 5-6 . Carotids 2+ bilat; no bruits. No lymphadenopathy or thryomegaly appreciated. Cor: PMI nondisplaced. Regular rate & rhythm. No rubs, gallops ., 2/6 LLSB. Lungs:  Decreased on right . On 2 liters [place].  Abdomen: soft, nontender, nondistended. No hepatosplenomegaly. No bruits or masses. Good bowel sounds. Extremities: no cyanosis, clubbing, rash, R and LLE 1+ edema. RUE PICC Neuro: alert & orientedx3, cranial nerves grossly intact. moves all 4 extremities w/o difficulty. Affect pleasant  Telemetry   SR-ST 80-100s with occasional PVCs   EKG    SR with ST elevation inferior leads. No change from previous. Dr Gala Romney reviewed.  Labs    CBC Recent Labs    10/28/21 0455 10/29/21 0540  WBC 7.1  9.1  HGB 11.1* 12.2*  HCT 31.9* 34.9*  MCV 85.3 84.9  PLT 291 319   Basic Metabolic Panel Recent Labs    24/23/53 0455 10/29/21 0540  NA 131* 131*  K 3.7 4.4  CL 97* 94*  CO2 27 27  GLUCOSE 110* 136*  BUN 32* 33*  CREATININE 1.33* 1.42*  CALCIUM 8.0* 8.6*   Liver Function Tests No results for input(s): AST, ALT, ALKPHOS, BILITOT, PROT, ALBUMIN in the last 72 hours. No results for input(s): LIPASE, AMYLASE in the last 72 hours. Cardiac Enzymes No results for input(s): CKTOTAL, CKMB, CKMBINDEX, TROPONINI in the last 72 hours.  BNP: BNP (last 3 results) Recent Labs    10/10/21 1606 10/22/21 1623 10/24/21 2150  BNP 1,564.6* 1,226.7* 1,306.1*    ProBNP (last 3 results) No results for input(s): PROBNP in the last 8760 hours.   D-Dimer No results for input(s): DDIMER in the last 72 hours. Hemoglobin A1C No results for input(s): HGBA1C in the last 72 hours. Fasting Lipid Panel No results for input(s): CHOL, HDL, LDLCALC, TRIG, CHOLHDL, LDLDIRECT in the last 72 hours. Thyroid Function Tests No results for input(s): TSH, T4TOTAL, T3FREE, THYROIDAB in the last 72 hours.  Invalid input(s): FREET3  Other results:   Imaging    No results found.   Medications:     Scheduled Medications:  aspirin EC  81 mg Oral Daily   atorvastatin  80 mg  Oral Daily   Chlorhexidine Gluconate Cloth  6 each Topical Daily   dapagliflozin propanediol  10 mg Oral Daily   digoxin  0.125 mg Oral Daily   escitalopram  10 mg Oral Daily   feeding supplement  237 mL Oral BID BM   furosemide  80 mg Intravenous BID   gabapentin  300 mg Oral QHS   latanoprost  1 drop Both Eyes QHS   pantoprazole  80 mg Oral Q1200   sodium chloride flush  10-40 mL Intracatheter Q12H   sodium chloride flush  3 mL Intravenous Q12H   spironolactone  25 mg Oral Daily    Infusions:  sodium chloride     amiodarone 30 mg/hr (10/29/21 0342)   heparin 1,950 Units/hr (10/29/21 0343)   milrinone 0.25  mcg/kg/min (10/29/21 0343)    PRN Medications: sodium chloride, acetaminophen, albuterol, guaiFENesin-dextromethorphan, loperamide, nitroGLYCERIN, ondansetron (ZOFRAN) IV, sodium chloride flush, sodium chloride flush, zolpidem    Patient Profile   76 y.o. male with hx COPD, prediabetes, HLD, GERD. Recent admit earlier this month with later presentation inferior STEMI and new systolic CHF w/ moderate to severe ischemic MR.   Readmitted with a/c CHF.  Assessment/Plan   1. CAD/recent late presentation inferior STEMI - LHC 05/04: 100% m RCA and 90% m LAD.  - RCA managed medically d/t completed infarct.  - Echo (5/23) EF 25%, RV moderately reduced, moderate to severe MR - Initial plan for PCI to LAD but with moderate to severe ischemic MR has been seen by TCTS for consideration of CABG (LIMA to LAD)/MVR. Films reviewed with Dr. Burt Knack. LAD not ideal for PCI. Had planned for surgery in a few weeks once recovered from MI. However, now readmitted with a/c CHF. TCTS consulted.  - Exertional CP on admit with HS troponin 858>>825.  - Active chest pain this morning. Given SL Nitro. Add imdur 60 mg daily, obtain HS trop, EKG, and give 2 mg morphine.  - Continues heparin gtt. - Continue aspirin and statin. No beta blocker with suspected low-output.   2. Acute on chronic systolic CHF with low-output/ICM - In setting of late presentation inferior STEMI 05/23 - Echo 05/23 EF 25-30%, RV moderately reduced, moderate to severe MR. - Limited echo on admit EF 30-35%, RV moderately reduced, MR not well evaluated, moderate pericardial effusion - NYHA IIIb -  CO-OX okay on admit but appeared low-output. Now on milrinone 0.25.  - CO-OX 64% on milrinone 0.25.  - CVP 5. One more dose of IV lasix and reassess.  - No BB with suspected low-output - Continue digoxin 0.125 mg daily. - Continue spiro 25 mg daily. - Continue Farxiga 10 mg daily - Continue to hold Losartan held d/t soft BP - No BP room to titrate  GDMT   3. Mitral regurgitation - Moderate to severe ischemic MR on TEE.  - considering CABG and MVR as above. TCTS consulted. - may need to consider mTEER if too tenuous for surgery   4. Aortic valve insufficiency - Mild to moderate AI - Will need to follow.   5. Pericardial effusion - Moderate in size on echo, no respiratory variation or RV collapse to suggest tamponade   6. H/o AKI - Scr stable 1.0>1.16>1.33>1.4  - Watch renal function with diuresis   7. Prediabetes - A1c 5.8 1/23 - Continue Farxiga   8. PAF In SR today. Continue amio drip.  Continue heparin drip.    If continues to have chest pain will need to consider IABP  for unstable angina.    CXR with pulmonary edema versus infiltrate. RLL. Check pro calcitonin.   Dr Haroldine Laws at bedside and agrees with plan.    Length of Stay: 5  Amy Clegg, NP  10/29/2021, 7:15 AM  Advanced Heart Failure Team Pager [phone] (M-F; 7a - 5p)  Please contact [facility] for night-coverage after hours (5p -7a ) and weekends on [email or web address]  Patient seen and examined with the above-signed Advanced Practice Provider and/or Housestaff. I personally reviewed laboratory data, imaging studies and relevant notes. I independently examined the patient and formulated the important aspects of the plan. I have edited the note to reflect any of my changes or salient points. I have personally discussed the plan with the patient and/or family.  This am with worsening SOB and CP. ECG stable. CXR with increasing pulmonary edema. Co-ox 64%. CVP 5-6   General:  SOB at rest No resp difficulty HEENT: normal Neck: supple. no JVD. Carotids 2+ bilat; no bruits. No lymphadenopathy or thryomegaly appreciated. Cor: PMI nondisplaced. Regular rate & rhythm. 2/6 MR Lungs: + crackles at both bases  Abdomen: soft, nontender, nondistended. No hepatosplenomegaly. No bruits or masses. Good bowel sounds. Extremities: no cyanosis, clubbing, rash, edema Neuro:  alert & orientedx3, cranial nerves grossly intact. moves all 4 extremities w/o difficulty. Affect pleasant  Very tenuous this am. Will increase lasix. Continue heparin. Cycle trops.   Tentative CABG/MVR Friday. If symptoms persiste move to ICU for swan and IABP.   Glori Bickers, MD  9:19 AM

## 2021-10-29 NOTE — Progress Notes (Signed)
At 0505 patient complaint of chest tightness of 4 in scale of 10. BP 109/79 EKG done PRN Nitro was given, cardiology was notified. Patient state chest tightness easing after the 2nd dose of nitro.

## 2021-10-30 ENCOUNTER — Inpatient Hospital Stay (HOSPITAL_COMMUNITY): Payer: Medicare HMO

## 2021-10-30 DIAGNOSIS — Z01810 Encounter for preprocedural cardiovascular examination: Secondary | ICD-10-CM | POA: Diagnosis not present

## 2021-10-30 DIAGNOSIS — I5043 Acute on chronic combined systolic (congestive) and diastolic (congestive) heart failure: Secondary | ICD-10-CM | POA: Diagnosis not present

## 2021-10-30 LAB — CBC
HCT: 32.5 % — ABNORMAL LOW (ref 39.0–52.0)
Hemoglobin: 11.6 g/dL — ABNORMAL LOW (ref 13.0–17.0)
MCH: 30.1 pg (ref 26.0–34.0)
MCHC: 35.7 g/dL (ref 30.0–36.0)
MCV: 84.2 fL (ref 80.0–100.0)
Platelets: 261 10*3/uL (ref 150–400)
RBC: 3.86 MIL/uL — ABNORMAL LOW (ref 4.22–5.81)
RDW: 13.3 % (ref 11.5–15.5)
WBC: 8.9 10*3/uL (ref 4.0–10.5)
nRBC: 0 % (ref 0.0–0.2)

## 2021-10-30 LAB — BASIC METABOLIC PANEL
Anion gap: 12 (ref 5–15)
BUN: 33 mg/dL — ABNORMAL HIGH (ref 8–23)
CO2: 27 mmol/L (ref 22–32)
Calcium: 8.4 mg/dL — ABNORMAL LOW (ref 8.9–10.3)
Chloride: 90 mmol/L — ABNORMAL LOW (ref 98–111)
Creatinine, Ser: 1.43 mg/dL — ABNORMAL HIGH (ref 0.61–1.24)
GFR, Estimated: 51 mL/min — ABNORMAL LOW (ref >60)
Glucose, Bld: 259 mg/dL — ABNORMAL HIGH (ref 70–99)
Potassium: 4.6 mmol/L (ref 3.5–5.1)
Sodium: 129 mmol/L — ABNORMAL LOW (ref 135–145)

## 2021-10-30 LAB — COOXEMETRY PANEL
Carboxyhemoglobin: 0.9 % (ref 0.5–1.5)
Methemoglobin: <0.7 % (ref 0.0–1.5)
O2 Saturation: 68 %
Total hemoglobin: 11.9 g/dL — ABNORMAL LOW (ref 12.0–16.0)

## 2021-10-30 LAB — HEPARIN LEVEL (UNFRACTIONATED)
Heparin Unfractionated: 0.95 IU/mL — ABNORMAL HIGH (ref 0.30–0.70)
Heparin Unfractionated: 0.65 IU/mL (ref 0.30–0.70)

## 2021-10-30 MED ORDER — FUROSEMIDE 10 MG/ML IJ SOLN
80.0000 mg | Freq: Every day | INTRAMUSCULAR | Status: DC
  Administered 2021-10-30: 80 mg via INTRAVENOUS
  Filled 2021-10-30: qty 8

## 2021-10-30 NOTE — Progress Notes (Signed)
OT Cancellation Note  Patient Details Name: Keith Jenkins MRN: [id number] DOB: 12/26/1945   Cancelled Treatment:    Reason Eval/Treat Not Completed: Patient at procedure or test/ unavailable. Will check back as schedule allows.  Mariam Dollar Beth Dixon, OTR/L 10/30/2021, 10:47 AM

## 2021-10-30 NOTE — Progress Notes (Signed)
Nutrition Follow-up  DOCUMENTATION CODES:   Not applicable  INTERVENTION:  Continue Ensure Enlive po BID, each supplement provides 350 kcal and 20 grams of protein.  Encourage adequate PO intake.  NUTRITION DIAGNOSIS:   Increased nutrient needs related to chronic illness (COPD, CHF) as evidenced by estimated needs; ongoing  GOAL:   Patient will meet greater than or equal to 90% of their needs; progressing  MONITOR:   PO intake, Supplement acceptance, Labs, Weight trends, Skin, I & O's  REASON FOR ASSESSMENT:   Malnutrition Screening Tool    ASSESSMENT:   76 y.o. male with  a history of COPD, prediabetes, GERD, hyperlipidemia who presents for the evaluation of chest pain. Pt with acute combined CHF/ICM.  Meal completion has been varied from 20-75% with 75% at lunch today. Family at bedside has been encouraging pt po intake. Pt currently has Ensure ordered and has been consuming them. RD to continue with current orders to aid in caloric and protein needs.   NUTRITION - FOCUSED PHYSICAL EXAM: Depletion may be likely related to the natural aging process.   Flowsheet Row Most Recent Value  Orbital Region Mild depletion  Upper Arm Region No depletion  Thoracic and Lumbar Region No depletion  Buccal Region Mild depletion  Temple Region No depletion  Clavicle Bone Region Moderate depletion  Clavicle and Acromion Bone Region Moderate depletion  Scapular Bone Region Unable to assess  Dorsal Hand No depletion  Patellar Region No depletion  Anterior Thigh Region No depletion  Posterior Calf Region No depletion  Edema (RD Assessment) Moderate  Hair Reviewed  Eyes Reviewed  Mouth Reviewed  Skin Reviewed  Nails Reviewed      Labs and medications reviewed.   Diet Order:   Diet Order             DIET DYS 3 Room service appropriate? Yes; Fluid consistency: Thin; Fluid restriction: 1500 mL Fluid  Diet effective now                   EDUCATION NEEDS:   Not  appropriate for education at this time  Skin:  Skin Assessment: Reviewed RN Assessment  Last BM:  5/22  Height:   Ht Readings from Last 1 Encounters:  10/24/21 6\' 1"  (1.854 m)    Weight:   Wt Readings from Last 1 Encounters:  10/30/21 92.6 kg   BMI:  Body mass index is 26.94 kg/m.  Estimated Nutritional Needs:   Kcal:  11/01/21  Protein:  115-125 grams  Fluid:  >/= 2 L/day  [phone], MS, RD, LDN RD pager number/after hours weekend pager number on Amion.

## 2021-10-30 NOTE — Progress Notes (Signed)
PT Cancellation Note  Patient Details Name: Keith Jenkins MRN: [id number] DOB: 1946/06/03   Cancelled Treatment:    Reason Eval/Treat Not Completed: Patient at procedure or test/unavailable.  Gone to vascular, will retry at another time.   Ivar Drape 10/30/2021, 9:55 AM  Samul Dada, PT PhD Acute Rehab Dept. Number: [facility] [id number] and [facility] [phone]

## 2021-10-30 NOTE — Progress Notes (Signed)
[place] for heparin Indication: chest pain/ACS/aflutter Brief A/P: Heparin level supratherapeutic Decrease Heparin rate  No Known Allergies  Patient Measurements: Height: 6\' 1"  (185.4 cm) Weight: 96.1 kg (211 lb 14.4 oz) IBW/kg (Calculated) : 79.9 Heparin Dosing Weight: 98kg  Vital Signs: Temp: 98.6 F (37 C) (05/24 0152) Temp Source: Oral (05/24 0152) BP: 102/68 (05/24 0152) Pulse Rate: 81 (05/24 0152)  Labs: Recent Labs    10/28/21 0455 10/29/21 0540 10/29/21 0739 10/29/21 0908 10/30/21 0430  HGB 11.1* 12.2*  --   --  11.6*  HCT 31.9* 34.9*  --   --  32.5*  PLT 291 319  --   --  261  HEPARINUNFRC 0.45 0.64  --   --  0.95*  CREATININE 1.33* 1.42*  --   --  1.43*  TROPONINIHS  --   --  422* 395*  --      Estimated Creatinine Clearance: 55.4 mL/min (A) (by C-G formula based on SCr of 1.43 mg/dL (H)).  Assessment: 76 y.o. male with PAF and chest pain s/ cath, awaiting possible CABG, got heparin  Goal of Therapy:  Heparin level 0.3-0.7 units/ml Monitor platelets by anticoagulation protocol: Yes   Plan:  Decrease Heparin 1700 units/hr Check heparin level in 8 hours.    Phillis Knack, PharmD, BCPS  10/30/2021 5:20 AM

## 2021-10-30 NOTE — Evaluation (Signed)
Physical Therapy Evaluation Patient Details Name: Keith Jenkins MRN: [id number] DOB: 29-Mar-1946 Today's Date: 10/30/2021  History of Present Illness  76 yo admitted 5/18 with SOB and chest pain after recent STEMI.  Had recurrence chest pain, dx CHF, a flutter, and had amiodarone, lasix  and heparin drips.  Now is relieved of chest pain, with plan for CABG and mitral valve repair on 5/26.  PMHx:  Cardiac cath 5/4, pre diabetes, HLD, GERD, arthritis, glaucoma  Clinical Impression  Pt was seen for evaluation upon returning from vein mapping for 5/26.  Pt is getting up to walk with supervised help and help to manage his lines.  Pt is hoping to go home from hosp with his wife with no detour for rehab.  Will try SPC to walk tomorrow but did talk with him about what the new surgery would look like for him with an AD.  Pt is still hoping to avoid RW but will just see how he tolerates it.  HR was 87, sat 93-95% post gait and pregait BP was 97/62.  Will monitor him for any symptoms related to the vitals to ensure he is stable, and no chest pain was reported the entire session.  Home with HHPT will be dc plan, and will try to cross paths with his wife to instruct for home.       Recommendations for follow up therapy are one component of a multi-disciplinary discharge planning process, led by the attending physician.  Recommendations may be updated based on patient status, additional functional criteria and insurance authorization.  Follow Up Recommendations Home health PT    Assistance Recommended at Discharge Intermittent Supervision/Assistance  Patient can return home with the following  A little help with walking and/or transfers;A little help with bathing/dressing/bathroom;Assistance with cooking/housework;Assist for transportation;Help with stairs or ramp for entrance    Equipment Recommendations None recommended by PT (will review after his surgery 5/26)  Recommendations for Other Services        Functional Status Assessment Patient has had a recent decline in their functional status and demonstrates the ability to make significant improvements in function in a reasonable and predictable amount of time.     Precautions / Restrictions Precautions Precautions: Fall Precaution Comments: monitor lines Restrictions Weight Bearing Restrictions: No      Mobility  Bed Mobility Overal bed mobility: Modified Independent                  Transfers Overall transfer level: Modified independent                      Ambulation/Gait Ambulation/Gait assistance: Supervision Gait Distance (Feet): 30 Feet Assistive device: 1 person hand held assist Gait Pattern/deviations: Step-through pattern, Decreased stride length, Wide base of support Gait velocity: reduced Gait velocity interpretation: <1.31 ft/sec, indicative of household ambulator Pre-gait activities: standing balance ck and line management General Gait Details: pt is either furniture walking or HHA  Stairs            Wheelchair Mobility    Modified Rankin (Stroke Patients Only)       Balance Overall balance assessment: Mild deficits observed, not formally tested Sitting-balance support: Feet supported, No upper extremity supported Sitting balance-Leahy Scale: Good     Standing balance support: Single extremity supported Standing balance-Leahy Scale: Fair  Pertinent Vitals/Pain Pain Assessment Pain Assessment: No/denies pain    Home Living Family/patient expects to be discharged to:: Private residence Living Arrangements: Spouse/significant other Available Help at Discharge: Family;Available 24 hours/day Type of Home: House Home Access: Stairs to enter Entrance Stairs-Rails: None Entrance Stairs-Number of Steps: 3 Alternate Level Stairs-Number of Steps: 14 Home Layout: Two level;Able to live on main level with bedroom/bathroom Home Equipment:  Kasandra Knudsen - single point Additional Comments: pt against using a RW    Prior Function Prior Level of Function : Independent/Modified Independent             Mobility Comments: SPC for recent weakness only       Hand Dominance   Dominant Hand: Right    Extremity/Trunk Assessment   Upper Extremity Assessment Upper Extremity Assessment: Overall WFL for tasks assessed    Lower Extremity Assessment Lower Extremity Assessment: Generalized weakness    Cervical / Trunk Assessment Cervical / Trunk Assessment: Kyphotic (mild)  Communication   Communication: No difficulties  Cognition Arousal/Alertness: Awake/alert Behavior During Therapy: WFL for tasks assessed/performed Overall Cognitive Status: Within Functional Limits for tasks assessed                                          General Comments General comments (skin integrity, edema, etc.): pt is assisted to steady briefly but with HHA is more upright in standing.  Not able to get pt to agree to do [facility] or walker here, will bring [facility] tomorrow    Exercises     Assessment/Plan    PT Assessment Patient needs continued PT services  PT Problem List Decreased strength;Decreased mobility;Decreased activity tolerance;Decreased balance;Cardiopulmonary status limiting activity       PT Treatment Interventions DME instruction;Gait training;Functional mobility training;Therapeutic activities;Therapeutic exercise;Balance training;Neuromuscular re-education;Patient/family education;Stair training    PT Goals (Current goals can be found in the Care Plan section)  Acute Rehab PT Goals Patient Stated Goal: home and resume usual activities after healing process PT Goal Formulation: With patient Time For Goal Achievement: 11/13/21 Potential to Achieve Goals: Good    Frequency Min 3X/week     Co-evaluation               AM-PAC PT "6 Clicks" Mobility  Outcome Measure Help needed turning from your back to your  side while in a flat bed without using bedrails?: None Help needed moving from lying on your back to sitting on the side of a flat bed without using bedrails?: A Little Help needed moving to and from a bed to a chair (including a wheelchair)?: A Little Help needed standing up from a chair using your arms (e.g., wheelchair or bedside chair)?: A Little Help needed to walk in hospital room?: A Little Help needed climbing 3-5 steps with a railing? : A Lot 6 Click Score: 18    End of Session Equipment Utilized During Treatment: Gait belt Activity Tolerance: Patient tolerated treatment well Patient left: in bed;with call bell/phone within reach Nurse Communication: Mobility status PT Visit Diagnosis: Other abnormalities of gait and mobility (R26.89);Muscle weakness (generalized) (M62.81)    Time: [id number] PT Time Calculation (min) (ACUTE ONLY): 32 min   Charges:   PT Evaluation $PT Eval Moderate Complexity: 1 Mod PT Treatments $Therapeutic Activity: 8-22 mins       Ramond Dial 10/30/2021, 3:48 PM  Mee Hives, PT PhD Acute Rehab Dept. Number: [facility]  [id number] and [place]

## 2021-10-30 NOTE — Progress Notes (Addendum)
Advanced Heart Failure Rounding Note  PCP-Cardiologist: None   Subjective:    05/19: Admit with a/c CHF. CO-OX okay but appeared low-output >>started milrinone 0.25 05/22: Developed A flutter. Started on amio drip and continued on heparin drip. Diuresd with IV lasix + metolazone 05/23: Chest pain. HS Trop 422>395. Procalcitonin was not elevated. Diuresed with IV lasix. Negative 1.3 liters. Started on imdur.   Remains on amio + heparin+ milrinone 0.25 mcg. CO-OX 68%.    Feels better today. Denies chest pain. Denies shortness of breath.   Objective:   Weight Range: 92.6 kg Body mass index is 26.94 kg/m.   Vital Signs:   Temp:  [97.6 F (36.4 C)-98.7 F (37.1 C)] 97.7 F (36.5 C) (05/24 0723) Pulse Rate:  [80-86] 83 (05/24 0723) Resp:  [19-20] 19 (05/24 0723) BP: (93-103)/(67-77) 93/69 (05/24 0723) SpO2:  [96 %-99 %] 97 % (05/24 0723) Weight:  [92.6 kg] 92.6 kg (05/24 0528) Last BM Date : 10/28/21  Weight change: Filed Weights   10/27/21 0700 10/28/21 0500 10/30/21 0528  Weight: 97.1 kg 96.1 kg 92.6 kg    Intake/Output:   Intake/Output Summary (Last 24 hours) at 10/30/2021 0732 Last data filed at 10/30/2021 0546 Gross per 24 hour  Intake 1296.88 ml  Output 2650 ml  Net -1353.12 ml    CVP 5-6   Physical Exam   General:  No resp difficulty HEENT: normal Neck: supple. no JVD. Carotids 2+ bilat; no bruits. No lymphadenopathy or thryomegaly appreciated. Cor: PMI nondisplaced. Regular rate & rhythm. No rubs, gallops. 2/6 MR Lungs: Decreased on RLL Abdomen: soft, nontender, nondistended. No hepatosplenomegaly. No bruits or masses. Good bowel sounds. Extremities: no cyanosis, clubbing, rash, edema. RUE PICC  Neuro: alert & orientedx3, cranial nerves grossly intact. moves all 4 extremities w/o difficulty. Affect pleasant  Telemetry   SR 80s personally checked.   EKG    N/A Labs    CBC Recent Labs    10/29/21 0540 10/30/21 0430  WBC 9.1 8.9  HGB 12.2*  11.6*  HCT 34.9* 32.5*  MCV 84.9 84.2  PLT 319 0000000   Basic Metabolic Panel Recent Labs    10/29/21 0540 10/30/21 0430  NA 131* 129*  K 4.4 4.6  CL 94* 90*  CO2 27 27  GLUCOSE 136* 259*  BUN 33* 33*  CREATININE 1.42* 1.43*  CALCIUM 8.6* 8.4*   Liver Function Tests No results for input(s): AST, ALT, ALKPHOS, BILITOT, PROT, ALBUMIN in the last 72 hours. No results for input(s): LIPASE, AMYLASE in the last 72 hours. Cardiac Enzymes No results for input(s): CKTOTAL, CKMB, CKMBINDEX, TROPONINI in the last 72 hours.  BNP: BNP (last 3 results) Recent Labs    10/10/21 1606 10/22/21 1623 10/24/21 2150  BNP 1,564.6* 1,226.7* 1,306.1*    ProBNP (last 3 results) No results for input(s): PROBNP in the last 8760 hours.   D-Dimer No results for input(s): DDIMER in the last 72 hours. Hemoglobin A1C No results for input(s): HGBA1C in the last 72 hours. Fasting Lipid Panel No results for input(s): CHOL, HDL, LDLCALC, TRIG, CHOLHDL, LDLDIRECT in the last 72 hours. Thyroid Function Tests No results for input(s): TSH, T4TOTAL, T3FREE, THYROIDAB in the last 72 hours.  Invalid input(s): FREET3  Other results:   Imaging    DG CHEST PORT 1 VIEW  Result Date: 10/29/2021 CLINICAL DATA:  Dyspnea EXAM: PORTABLE CHEST 1 VIEW COMPARISON:  Chest x-ray dated Oct 24, 2021 FINDINGS: Cardiac and mediastinal contours are unchanged. Interval placement  of right arm PICC with tip positioned over the expected area of the mid SVC. Small bilateral pleural effusions. Increased bilateral interstitial opacities, likely due to worsening pulmonary edema. No evidence of pneumothorax. IMPRESSION: 1. Interval placement of right arm PICC with tip positioned at the expected area of the mid SVC. 2. Similar small bilateral pleural effusions. Increased bilateral interstitial opacities, likely due to worsening pulmonary edema. Electronically Signed   By: Yetta Glassman M.D.   On: 10/29/2021 08:28      Medications:     Scheduled Medications:  aspirin EC  81 mg Oral Daily   atorvastatin  80 mg Oral Daily   Chlorhexidine Gluconate Cloth  6 each Topical Daily   dapagliflozin propanediol  10 mg Oral Daily   digoxin  0.125 mg Oral Daily   escitalopram  10 mg Oral Daily   feeding supplement  237 mL Oral BID BM   furosemide  80 mg Intravenous BID   gabapentin  300 mg Oral QHS   isosorbide mononitrate  60 mg Oral Daily   latanoprost  1 drop Both Eyes QHS   pantoprazole  80 mg Oral Q1200   sodium chloride flush  10-40 mL Intracatheter Q12H   sodium chloride flush  3 mL Intravenous Q12H   spironolactone  25 mg Oral Daily    Infusions:  sodium chloride     amiodarone 30 mg/hr (10/30/21 0255)   heparin 1,950 Units/hr (10/30/21 0544)   milrinone 0.25 mcg/kg/min (10/30/21 0546)    PRN Medications: sodium chloride, acetaminophen, albuterol, guaiFENesin-dextromethorphan, loperamide, morphine injection, nitroGLYCERIN, ondansetron (ZOFRAN) IV, sodium chloride flush, sodium chloride flush, zolpidem    Patient Profile   76 y.o. male with hx COPD, prediabetes, HLD, GERD. Recent admit earlier this month with later presentation inferior STEMI and new systolic CHF w/ moderate to severe ischemic MR.   Readmitted with a/c CHF.  Assessment/Plan   1. CAD/recent late presentation inferior STEMI - LHC 05/04: 100% m RCA and 90% m LAD.  - RCA managed medically d/t completed infarct.  - Echo (5/23) EF 25%, RV moderately reduced, moderate to severe MR - Initial plan for PCI to LAD but with moderate to severe ischemic MR has been seen by TCTS for consideration of CABG (LIMA to LAD)/MVR. Films reviewed with Dr. Burt Knack. LAD not ideal for PCI. Had planned for surgery in a few weeks once recovered from MI. However, now readmitted with a/c CHF. TCTS consulted.  - Exertional CP on admit with HS troponin 858>>825. Recurrent chest pain on 5/23 HS Trop 422>395. - Chest pain resolved Continue  imdur 60 mg  daily - Continues heparin gtt. - Continue aspirin and statin. No beta blocker with suspected low-output.   2. Acute on chronic systolic CHF with low-output/ICM - In setting of late presentation inferior STEMI 05/23 - Echo 05/23 EF 25-30%, RV moderately reduced, moderate to severe MR. - Limited echo on admit EF 30-35%, RV moderately reduced, MR not well evaluated, moderate pericardial effusion - NYHA IIIb -  CO-OX okay on admit but appeared low-output. Now on milrinone 0.25.  - CO-OX 68% on milrinone 0.25.  - CVP 5-6 . Cut back IV lasix to 80 mg daily.  - No BB with suspected low-output - Continue digoxin 0.125 mg daily. - Continue spiro 25 mg daily. - Continue Farxiga 10 mg daily - Continue to hold Losartan held d/t soft BP - No BP room to titrate GDMT   3. Mitral regurgitation - Moderate to severe ischemic MR on TEE.  -  considering CABG and MVR as above. TCTS consulted. - may need to consider mTEER if too tenuous for surgery   4. Aortic valve insufficiency - Mild to moderate AI - Will need to follow.   5. Pericardial effusion - Moderate in size on echo, no respiratory variation or RV collapse to suggest tamponade   6. H/o AKI - Scr stable 1.0>1.16>1.33>1.4 >1.4 - Watch renal function with diuresis   7. Prediabetes - A1c 5.8 1/23 - Continue Farxiga   8. PAF Maintaining SR.  Continue amio drip.  Continue heparin drip.  Level high. Pharmacy addressing.   9. Hyponatremia  Sodium 129  Restrict free water.   Add incentive spirometer. OOB today.   Length of Stay: [place], NP  10/30/2021, 7:32 AM  Advanced Heart Failure Team Pager [phone] (M-F; 7a - 5p)  Please contact [facility] for night-coverage after hours (5p -7a ) and weekends on [email or web address]  Pt seen with NP, agree with the above note.   No chest pain today, no dyspnea.  CVP 5-6, co-ox 68%.  He remains on milrinone 0.25.  He is on amiodarone gtt in NSR.  Good diuresis yesterday with IV Lasix.    General: NAD Neck: No JVD, no thyromegaly or thyroid nodule.  Lungs: Clear to auscultation bilaterally with normal respiratory effort. CV: Nondisplaced PMI.  Heart regular S1/S2, no S3/S4, 2/6 HSM apex.  No peripheral edema.   Abdomen: Soft, nontender, no hepatosplenomegaly, no distention.  Skin: Intact without lesions or rashes.  Neurologic: Alert and oriented x 3.  Psych: Normal affect. Extremities: No clubbing or cyanosis.  HEENT: Normal.   Continue milrinone for now, good co-ox.  Decrease Lasix to 80 mg IV daily, volume status improved.   Tentative plan for CABG/MV repair on Friday.   Loralie Champagne 10/30/2021 10:36 AM

## 2021-10-30 NOTE — Progress Notes (Signed)
ANTICOAGULATION CONSULT NOTE  Pharmacy Consult for heparin Indication: chest pain/ACS/aflutter Brief A/P: Heparin level supratherapeutic Decrease Heparin rate  No Known Allergies  Patient Measurements: Height: 6\' 1"  (185.4 cm) Weight: 92.6 kg (204 lb 3.2 oz) IBW/kg (Calculated) : 79.9 Heparin Dosing Weight: 98kg  Vital Signs: Temp: 98 F (36.7 C) (05/24 1200) Temp Source: Oral (05/24 1200) BP: 106/76 (05/24 1200) Pulse Rate: 88 (05/24 1200)  Labs: Recent Labs    10/28/21 0455 10/29/21 0540 10/29/21 0739 10/29/21 0908 10/30/21 0430 10/30/21 1515  HGB 11.1* 12.2*  --   --  11.6*  --   HCT 31.9* 34.9*  --   --  32.5*  --   PLT 291 319  --   --  261  --   HEPARINUNFRC 0.45 0.64  --   --  0.95* 0.65  CREATININE 1.33* 1.42*  --   --  1.43*  --   TROPONINIHS  --   --  422* 395*  --   --      Estimated Creatinine Clearance: 51.2 mL/min (A) (by C-G formula based on SCr of 1.43 mg/dL (H)).  Assessment: 76 y.o. male with PAF and chest pain s/ cath, awaiting possible CABG, got heparin  Heparin level 0.65 (on heparin 1700 units/hr) No signs/symptoms of bleed  Goal of Therapy:  Heparin level 0.3-0.7 units/ml Monitor platelets by anticoagulation protocol: Yes   Plan:  Decrease Heparin 1600 units/hr to target middle of range Daily heparin level and CBC ordered Monitor for signs/symptoms of bleed  Thank you for allowing pharmacy to be a part of this patient's care.  68, PharmD Clinical Pharmacist  Please check AMION for all [facility] Pharm[facility] numbers After 10:00 PM, call Main Pharmacy [phone]

## 2021-10-31 DIAGNOSIS — I350 Nonrheumatic aortic (valve) stenosis: Secondary | ICD-10-CM | POA: Diagnosis not present

## 2021-10-31 DIAGNOSIS — I2510 Atherosclerotic heart disease of native coronary artery without angina pectoris: Secondary | ICD-10-CM | POA: Diagnosis not present

## 2021-10-31 DIAGNOSIS — I5043 Acute on chronic combined systolic (congestive) and diastolic (congestive) heart failure: Secondary | ICD-10-CM | POA: Diagnosis not present

## 2021-10-31 DIAGNOSIS — I4891 Unspecified atrial fibrillation: Secondary | ICD-10-CM | POA: Diagnosis not present

## 2021-10-31 LAB — BASIC METABOLIC PANEL
Anion gap: 9 (ref 5–15)
BUN: 38 mg/dL — ABNORMAL HIGH (ref 8–23)
CO2: 30 mmol/L (ref 22–32)
Calcium: 8.5 mg/dL — ABNORMAL LOW (ref 8.9–10.3)
Chloride: 87 mmol/L — ABNORMAL LOW (ref 98–111)
Creatinine, Ser: 1.46 mg/dL — ABNORMAL HIGH (ref 0.61–1.24)
GFR, Estimated: 50 mL/min — ABNORMAL LOW (ref >60)
Glucose, Bld: 212 mg/dL — ABNORMAL HIGH (ref 70–99)
Potassium: 4.3 mmol/L (ref 3.5–5.1)
Sodium: 126 mmol/L — ABNORMAL LOW (ref 135–145)

## 2021-10-31 LAB — CBC
HCT: 33.8 % — ABNORMAL LOW (ref 39.0–52.0)
Hemoglobin: 12 g/dL — ABNORMAL LOW (ref 13.0–17.0)
MCH: 29.9 pg (ref 26.0–34.0)
MCHC: 35.5 g/dL (ref 30.0–36.0)
MCV: 84.3 fL (ref 80.0–100.0)
Platelets: 261 10*3/uL (ref 150–400)
RBC: 4.01 MIL/uL — ABNORMAL LOW (ref 4.22–5.81)
RDW: 13.4 % (ref 11.5–15.5)
WBC: 9.3 10*3/uL (ref 4.0–10.5)
nRBC: 0 % (ref 0.0–0.2)

## 2021-10-31 LAB — COOXEMETRY PANEL
Carboxyhemoglobin: 1.5 % (ref 0.5–1.5)
Methemoglobin: <0.7 % (ref 0.0–1.5)
O2 Saturation: 69.5 %
Total hemoglobin: 12.2 g/dL (ref 12.0–16.0)

## 2021-10-31 LAB — URINALYSIS, ROUTINE W REFLEX MICROSCOPIC
Bilirubin Urine: NEGATIVE
Glucose, UA: >=500 mg/dL — AB
Hgb urine dipstick: NEGATIVE
Ketones, ur: NEGATIVE mg/dL
Leukocytes,Ua: NEGATIVE
Nitrite: NEGATIVE
Protein, ur: NEGATIVE mg/dL
Specific Gravity, Urine: 1.013 (ref 1.005–1.030)
pH: 6 (ref 5.0–8.0)

## 2021-10-31 LAB — SURGICAL PCR SCREEN
MRSA, PCR: NEGATIVE
Staphylococcus aureus: NEGATIVE

## 2021-10-31 LAB — HEPARIN LEVEL (UNFRACTIONATED): Heparin Unfractionated: 0.56 IU/mL (ref 0.30–0.70)

## 2021-10-31 LAB — TYPE AND SCREEN
ABO/RH(D): O POS
Antibody Screen: NEGATIVE

## 2021-10-31 LAB — SARS CORONAVIRUS 2 BY RT PCR: SARS Coronavirus 2 by RT PCR: NEGATIVE

## 2021-10-31 MED ORDER — POLYETHYLENE GLYCOL 3350 17 G PO PACK
17.0000 g | PACK | Freq: Every day | ORAL | Status: DC | PRN
  Administered 2021-10-31: 17 g via ORAL
  Filled 2021-10-31: qty 1

## 2021-10-31 MED ORDER — TRANEXAMIC ACID (OHS) BOLUS VIA INFUSION
15.0000 mg/kg | INTRAVENOUS | Status: AC
  Administered 2021-11-01: 1396.5 mg via INTRAVENOUS
  Filled 2021-10-31: qty 1397

## 2021-10-31 MED ORDER — NOREPINEPHRINE 4 MG/250ML-% IV SOLN
0.0000 ug/min | INTRAVENOUS | Status: DC
  Filled 2021-10-31: qty 250

## 2021-10-31 MED ORDER — CHLORHEXIDINE GLUCONATE CLOTH 2 % EX PADS
6.0000 | MEDICATED_PAD | Freq: Once | CUTANEOUS | Status: AC
  Administered 2021-10-31: 6 via TOPICAL

## 2021-10-31 MED ORDER — DEXMEDETOMIDINE HCL IN NACL 400 MCG/100ML IV SOLN
0.1000 ug/kg/h | INTRAVENOUS | Status: AC
  Administered 2021-11-01: .2 ug/kg/h via INTRAVENOUS
  Filled 2021-10-31: qty 100

## 2021-10-31 MED ORDER — PLASMA-LYTE A IV SOLN
INTRAVENOUS | Status: DC
  Filled 2021-10-31: qty 2.5

## 2021-10-31 MED ORDER — TEMAZEPAM 15 MG PO CAPS
15.0000 mg | ORAL_CAPSULE | Freq: Once | ORAL | Status: DC | PRN
Start: 2021-10-31 — End: 2021-11-01

## 2021-10-31 MED ORDER — INSULIN REGULAR(HUMAN) IN NACL 100-0.9 UT/100ML-% IV SOLN
INTRAVENOUS | Status: AC
  Administered 2021-11-01: .9 [IU]/h via INTRAVENOUS
  Filled 2021-10-31: qty 100

## 2021-10-31 MED ORDER — SENNOSIDES-DOCUSATE SODIUM 8.6-50 MG PO TABS: 1.0000 | ORAL_TABLET | ORAL | Status: DC | PRN

## 2021-10-31 MED ORDER — PHENYLEPHRINE HCL-NACL 20-0.9 MG/250ML-% IV SOLN
30.0000 ug/min | INTRAVENOUS | Status: AC
  Administered 2021-11-01: 20 ug/min via INTRAVENOUS
  Filled 2021-10-31: qty 250

## 2021-10-31 MED ORDER — VANCOMYCIN HCL 1500 MG/300ML IV SOLN
1500.0000 mg | INTRAVENOUS | Status: AC
  Administered 2021-11-01: 1500 mg via INTRAVENOUS
  Filled 2021-10-31: qty 300

## 2021-10-31 MED ORDER — METOPROLOL TARTRATE 12.5 MG HALF TABLET
12.5000 mg | ORAL_TABLET | Freq: Once | ORAL | Status: AC
  Administered 2021-11-01: 12.5 mg via ORAL
  Filled 2021-10-31: qty 1

## 2021-10-31 MED ORDER — MILRINONE LACTATE IN DEXTROSE 20-5 MG/100ML-% IV SOLN
0.3000 ug/kg/min | INTRAVENOUS | Status: DC
  Filled 2021-10-31 (×2): qty 100

## 2021-10-31 MED ORDER — FUROSEMIDE 10 MG/ML IJ SOLN
80.0000 mg | Freq: Every day | INTRAMUSCULAR | Status: AC
  Administered 2021-10-31: 80 mg via INTRAVENOUS
  Filled 2021-10-31: qty 8

## 2021-10-31 MED ORDER — MAGNESIUM SULFATE 50 % IJ SOLN
40.0000 meq | INTRAMUSCULAR | Status: DC
  Filled 2021-10-31 (×2): qty 9.85

## 2021-10-31 MED ORDER — TRANEXAMIC ACID (OHS) PUMP PRIME SOLUTION
2.0000 mg/kg | INTRAVENOUS | Status: DC
  Filled 2021-10-31: qty 1.86

## 2021-10-31 MED ORDER — BISACODYL 5 MG PO TBEC: 5.0000 mg | DELAYED_RELEASE_TABLET | Freq: Once | ORAL | Status: DC

## 2021-10-31 MED ORDER — NITROGLYCERIN IN D5W 200-5 MCG/ML-% IV SOLN
2.0000 ug/min | INTRAVENOUS | Status: DC
  Filled 2021-10-31: qty 250

## 2021-10-31 MED ORDER — CEFAZOLIN SODIUM-DEXTROSE 2-4 GM/100ML-% IV SOLN
2.0000 g | INTRAVENOUS | Status: DC
Start: 2021-11-01 — End: 2021-11-01
  Filled 2021-10-31: qty 100

## 2021-10-31 MED ORDER — EPINEPHRINE HCL 5 MG/250ML IV SOLN IN NS
0.0000 ug/min | INTRAVENOUS | Status: AC
  Administered 2021-11-01: 5 ug/min via INTRAVENOUS
  Filled 2021-10-31: qty 250

## 2021-10-31 MED ORDER — POTASSIUM CHLORIDE 2 MEQ/ML IV SOLN
80.0000 meq | INTRAVENOUS | Status: DC
  Filled 2021-10-31: qty 40

## 2021-10-31 MED ORDER — CEFAZOLIN SODIUM-DEXTROSE 2-4 GM/100ML-% IV SOLN
2.0000 g | INTRAVENOUS | Status: AC
  Administered 2021-11-01 (×2): 2 g via INTRAVENOUS
  Filled 2021-10-31: qty 100

## 2021-10-31 MED ORDER — CHLORHEXIDINE GLUCONATE CLOTH 2 % EX PADS: 6.0000 | MEDICATED_PAD | Freq: Once | CUTANEOUS | Status: AC

## 2021-10-31 MED ORDER — MANNITOL 20 % IV SOLN
INTRAVENOUS | Status: DC
  Filled 2021-10-31: qty 13

## 2021-10-31 MED ORDER — CHLORHEXIDINE GLUCONATE 0.12 % MT SOLN
15.0000 mL | Freq: Once | OROMUCOSAL | Status: AC
  Administered 2021-11-01: 15 mL via OROMUCOSAL
  Filled 2021-10-31: qty 15

## 2021-10-31 MED ORDER — TRANEXAMIC ACID 1000 MG/10ML IV SOLN
1.5000 mg/kg/h | INTRAVENOUS | Status: AC
  Administered 2021-11-01: 1.5 mg/kg/h via INTRAVENOUS
  Filled 2021-10-31: qty 25

## 2021-10-31 MED ORDER — DIAZEPAM 2 MG PO TABS
2.0000 mg | ORAL_TABLET | Freq: Once | ORAL | Status: AC
  Administered 2021-11-01: 2 mg via ORAL
  Filled 2021-10-31: qty 1

## 2021-10-31 MED ORDER — HEPARIN 30,000 UNITS/1000 ML (OHS) CELLSAVER SOLUTION
Status: DC
  Filled 2021-10-31: qty 1000

## 2021-10-31 NOTE — Plan of Care (Signed)

## 2021-10-31 NOTE — Plan of Care (Signed)
  Problem: Education: Goal: Ability to demonstrate management of disease process will improve 10/31/2021 0101 by Jeanella Flattery, RN Outcome: Progressing 10/30/2021 2231 by Jeanella Flattery, RN Reactivated 10/30/2021 2231 by Jeanella Flattery, RN Reactivated 10/30/2021 2230 by Jeanella Flattery, RN Outcome: Not Applicable   Problem: Education: Goal: Ability to verbalize understanding of medication therapies will improve 10/31/2021 0101 by Jeanella Flattery, RN Outcome: Progressing 10/30/2021 2231 by Jeanella Flattery, RN Reactivated 10/30/2021 2231 by Jeanella Flattery, RN Reactivated 10/30/2021 2230 by Jeanella Flattery, RN Outcome: Not Applicable

## 2021-10-31 NOTE — Evaluation (Signed)
Occupational Therapy Evaluation Patient Details Name: Keith Jenkins MRN: [id number] DOB: 05-14-1946 Today's Date: 10/31/2021   History of Present Illness 76 yo admitted 5/18 with SOB and chest pain after recent STEMI.  Pt with CHF and Aflutter. Plan for CABG and MVR 5/26.  PMHx:pre diabetes, HLD, GERD, arthritis, glaucoma   Clinical Impression   PTA, pt was living with his wife and was independent. Pt currently performing ADLs and functional mobility at Supervision level. Pt presenting with decreased activity tolerance as seen by fatigue and requiring increased time. HR 80s. SpO2 90 on RA. Pt would benefit from further acute OT to facilitate safe dc. Pending pt progress after sx, recommend dc to home once medically stable per physician.       Recommendations for follow up therapy are one component of a multi-disciplinary discharge planning process, led by the attending physician.  Recommendations may be updated based on patient status, additional functional criteria and insurance authorization.   Follow Up Recommendations  No OT follow up    Assistance Recommended at Discharge PRN  Patient can return home with the following Assist for transportation;Assistance with cooking/housework    Functional Status Assessment  Patient has not had a recent decline in their functional status  Equipment Recommendations  None recommended by OT    Recommendations for Other Services       Precautions / Restrictions Precautions Precautions: Fall Precaution Comments: monitor lines Restrictions Weight Bearing Restrictions: No      Mobility Bed Mobility               General bed mobility comments: Sitting at EOB upon arrival    Transfers Overall transfer level: Needs assistance Equipment used: None Transfers: Sit to/from Stand Sit to Stand: Supervision           General transfer comment: Supervision for safety. maintaining sternal precautions with hands over knees in preparation  for CABG tomorrow      Balance Overall balance assessment: Mild deficits observed, not formally tested Sitting-balance support: Feet supported, No upper extremity supported Sitting balance-Leahy Scale: Good     Standing balance support: During functional activity, No upper extremity supported Standing balance-Leahy Scale: Good                             ADL either performed or assessed with clinical judgement   ADL Overall ADL's : Needs assistance/impaired                                       General ADL Comments: Currently performing at Supervision level. Requiring increased time due to fatigue.     Vision Baseline Vision/History: 3 Glaucoma Ability to See in Adequate Light: 0 Adequate Patient Visual Report: No change from baseline Vision Assessment?: No apparent visual deficits     Perception     Praxis      Pertinent Vitals/Pain Pain Assessment Pain Assessment: No/denies pain     Hand Dominance Right   Extremity/Trunk Assessment Upper Extremity Assessment Upper Extremity Assessment: Overall WFL for tasks assessed   Lower Extremity Assessment Lower Extremity Assessment: Defer to PT evaluation   Cervical / Trunk Assessment Cervical / Trunk Assessment: Kyphotic (mild)   Communication Communication Communication: No difficulties   Cognition Arousal/Alertness: Awake/alert Behavior During Therapy: WFL for tasks assessed/performed Overall Cognitive Status: Within Functional Limits for tasks assessed  General Comments  HR 80s. SpO2 90s on RA. Son and grandson present    Exercises     Shoulder Instructions      Home Living Family/patient expects to be discharged to:: Private residence Living Arrangements: Spouse/significant other Available Help at Discharge: Family;Available 24 hours/day Type of Home: House Home Access: Stairs to enter [employer] of Steps:  3 Entrance Stairs-Rails: None Home Layout: Two level;Able to live on main level with bedroom/bathroom Alternate Level Stairs-Number of Steps: 14 Alternate Level Stairs-Rails: Left Bathroom Shower/Tub: Chief Strategy Officer: Standard     Home Equipment: Cane - single point   Additional Comments: pt against using a RW      Prior Functioning/Environment Prior Level of Function : Independent/Modified Independent             Mobility Comments: SPC recently ADLs Comments: Performs ADLs and IADLs        OT Problem List: Decreased strength;Decreased range of motion;Decreased activity tolerance      OT Treatment/Interventions: Self-care/ADL training;Therapeutic exercise;Energy conservation;DME and/or AE instruction;Therapeutic activities;Patient/family education    OT Goals(Current goals can be found in the care plan section) Acute Rehab OT Goals Patient Stated Goal: Return home OT Goal Formulation: With patient/family Time For Goal Achievement: 11/14/21 Potential to Achieve Goals: Good  OT Frequency: Min 2X/week    Co-evaluation              AM-PAC OT "6 Clicks" Daily Activity     Outcome Measure Help from another person eating meals?: None Help from another person taking care of personal grooming?: None Help from another person toileting, which includes using toliet, bedpan, or urinal?: None Help from another person bathing (including washing, rinsing, drying)?: None Help from another person to put on and taking off regular upper body clothing?: None Help from another person to put on and taking off regular lower body clothing?: None 6 Click Score: 24   End of Session Nurse Communication: Mobility status  Activity Tolerance: Patient tolerated treatment well Patient left: in bed;with call bell/phone within reach;with family/visitor present  OT Visit Diagnosis: Unsteadiness on feet (R26.81);Other abnormalities of gait and mobility (R26.89);Muscle  weakness (generalized) (M62.81)                Time: 2330-0762 OT Time Calculation (min): 15 min Charges:  OT General Charges $OT Visit: 1 Visit OT Evaluation $OT Eval Moderate Complexity: 1 Mod  Peggie Hornak MSOT, OTR/L Acute Rehab Pager: [phone] Office: [phone]  Theodoro Grist Keli Buehner 10/31/2021, 5:11 PM

## 2021-10-31 NOTE — Progress Notes (Signed)
Procedure(s) (LRB): MITRAL VALVE (MV) REPLACEMENT (N/A) CORONARY ARTERY BYPASS GRAFTING (CABG) (N/A) MAZE (N/A) TRANSESOPHAGEAL ECHOCARDIOGRAM (TEE) (N/A) Subjective: Feels better today. Walked in hall, no SOB or chest pain. Ate some.  Objective: Vital signs in last 24 hours: Temp:  [97.9 F (36.6 C)-99.5 F (37.5 C)] 99.5 F (37.5 C) (05/25 1139) Pulse Rate:  [79-86] 79 (05/25 1139) Cardiac Rhythm: Normal sinus rhythm (05/25 1300) Resp:  [15-20] 20 (05/25 1139) BP: (90-104)/(62-72) 90/62 (05/25 1139) SpO2:  [90 %-96 %] 96 % (05/25 1139) Weight:  [93.1 kg] 93.1 kg (05/25 0222)  Hemodynamic parameters for last 24 hours: CVP:  [7 mmHg-9 mmHg] 7 mmHg  Intake/Output from previous day: 05/24 0701 - 05/25 0700 In: 1701 [P.O.:720; I.V.:981] Out: 1450 [Urine:1450] Intake/Output this shift: Total I/O In: -  Out: 700 [Urine:700]  General appearance: alert and cooperative Heart: regular rate and rhythm, S1, S2 normal, no murmur Lungs: clear to auscultation bilaterally  Lab Results: Recent Labs    10/30/21 0430 10/31/21 0500  WBC 8.9 9.3  HGB 11.6* 12.0*  HCT 32.5* 33.8*  PLT 261 261   BMET:  Recent Labs    10/30/21 0430 10/31/21 0500  NA 129* 126*  K 4.6 4.3  CL 90* 87*  CO2 27 30  GLUCOSE 259* 212*  BUN 33* 38*  CREATININE 1.43* 1.46*  CALCIUM 8.4* 8.5*    PT/INR: No results for input(s): LABPROT, INR in the last 72 hours. ABG    Component Value Date/Time   O2SAT 69.5 10/31/2021 0445   CBG (last 3)  No results for input(s): GLUCAP in the last 72 hours.  Assessment/Plan:  He has been hemodynamically stable on milrinone 0.25 but BP still marginal at 90-100. Co-ox 69.5 this am.  Creat inching up to 1.46 today with diuresis. Na down to 126.   Will plan to proceed with CABG/MVR and MAZE tomorrow.   I discussed the operative procedure with the patient and his son including alternatives, benefits and risks; including but not limited to bleeding, blood  transfusion, infection, stroke, myocardial infarction, graft failure, heart block requiring a permanent pacemaker, organ dysfunction, and death.  Mechele Collin understands and agrees to proceed.         LOS: 7 days    Keith Jenkins 10/31/2021

## 2021-10-31 NOTE — TOC Initial Note (Signed)
Transition of Care [facility]) - Initial/Assessment Note    Patient Details  Name: Keith Jenkins MRN: [id number] Date of Birth: [birth year]  [facility]) CM/SW Contact:    Verdell Carmine, RN Phone Number: 10/31/2021, 11:47 AM  Clinical Narrative:                  Patient is a 76 YO high readmission,  Will have TAVR on Friday.  Discussed discharge planning, Gave home health choices from [email or web address]. Copy on shadow chart. Patient does not have DME at home according to wife.  PT recommended home health and  NO DME at this time. But will review after surgical intervention.   TOC will follow for needs, recommendations, and transitions   Expected Discharge Plan: [place]     Patient Goals and CMS Choice     Choice offered to / list presented to : Spouse  Expected Discharge Plan and Services Expected Discharge Plan: [place]   Discharge Planning Services: CM Consult                                          Prior Living Arrangements/Services     Patient language and need for interpreter reviewed:: Yes Do you feel safe going back to the place where you live?: Yes      Need for Family Participation in Patient Care: Yes (Comment) Care giver support system in place?: Yes (comment)   Criminal Activity/Legal Involvement Pertinent to Current Situation/Hospitalization: No - Comment as needed  Activities of Daily Living Home Assistive Devices/Equipment: Cane (specify quad or straight) ADL Screening (condition at time of admission) Patient's cognitive ability adequate to safely complete daily activities?: Yes Is the patient deaf or have difficulty hearing?: No Does the patient have difficulty seeing, even when wearing glasses/contacts?: No Does the patient have difficulty concentrating, remembering, or making decisions?: No Patient able to express need for assistance with ADLs?: Yes Does the patient have difficulty dressing  or bathing?: No Independently performs ADLs?: Yes (appropriate for developmental age) Does the patient have difficulty walking or climbing stairs?: No Weakness of Legs: None Weakness of Arms/Hands: None  Permission Sought/Granted                  Emotional Assessment       Orientation: : Oriented to Self, Oriented to Place, Oriented to  Time, Oriented to Situation   Psych Involvement: No (comment)  Admission diagnosis:  Acute on chronic combined systolic (congestive) and diastolic (congestive) heart failure (HCC) [I50.43] Patient Active Problem List   Diagnosis Date Noted   Acute on chronic combined systolic (congestive) and diastolic (congestive) heart failure ([place]) 10/24/2021   Pericardial effusion    Severe mitral regurgitation    NSTEMI (non-ST elevated myocardial infarction) ([place]) 10/10/2021   Non-ST elevation (NSTEMI) myocardial infarction ([place]) [id number]   Acute systolic heart failure ([place]) 10/10/2021   Hyperlipidemia 10/10/2021   PCP:  Rip Harbour, NP Pharmacy:   [facility] [place] [place] [address] [place] [address] Phone: [phone] Fax: [phone]  [facility] [address] [place] [address] Phone: [phone] Fax: [phone]     Social Determinants of Health (SDOH) Interventions    Readmission Risk Interventions     View : No data to display.

## 2021-10-31 NOTE — Progress Notes (Addendum)
Advanced Heart Failure Rounding Note  PCP-Cardiologist: None   Subjective:    05/19: Admit with a/c CHF. CO-OX okay but appeared low-output >>started milrinone 0.25 05/22: Developed A flutter. Started on amio drip. Diuresd with IV lasix + metolazone 05/23: Chest pain. HS Trop 422>395. Started on imdur.   Remains on amio at 30/hr, milrinone 0.25, and heparin gtt  CO-OX 70%  CVP 5-6. Continues on IV lasix 80 daily.   Scr stable at 1.46. K 4.3 and Na 126.   SBP 90s-100s  Dyspnea significantly improved. Ambulated with PT yesterday. Good appetite.   Objective:   Weight Range: 93.1 kg Body mass index is 27.07 kg/m.   Vital Signs:   Temp:  [97.9 F (36.6 C)-99.2 F (37.3 C)] 97.9 F (36.6 C) (05/25 0400) Pulse Rate:  [79-90] 79 (05/25 0400) Resp:  [15-20] 15 (05/25 0400) BP: (98-106)/(69-76) 104/71 (05/25 0400) SpO2:  [90 %-96 %] 96 % (05/25 0400) Weight:  [93.1 kg] 93.1 kg (05/25 0222) Last BM Date : 10/29/21  Weight change: Filed Weights   10/28/21 0500 10/30/21 0528 10/31/21 0222  Weight: 96.1 kg 92.6 kg 93.1 kg    Intake/Output:   Intake/Output Summary (Last 24 hours) at 10/31/2021 0808 Last data filed at 10/31/2021 0528 Gross per 24 hour  Intake 1461.03 ml  Output 1450 ml  Net 11.03 ml      Physical Exam  CVP 5-6 General:  Well appearing. No resp difficulty HEENT: normal Neck: supple. no JVD. Carotids 2+ bilat; no bruits.  Cor: PMI nondisplaced. Regular rate & rhythm. No rubs, gallops, 2/6 MR murmur Lungs: clear Abdomen: soft, nontender, nondistended. No hepatosplenomegaly. Extremities: no cyanosis, clubbing, rash, edema, TED hose on, RUE PICC Neuro: alert & orientedx3, cranial nerves grossly intact. moves all 4 extremities w/o difficulty. Affect pleasant   Telemetry   SR 70s-80s (personally reviewed)  EKG    N/A Labs    CBC Recent Labs    10/30/21 0430 10/31/21 0500  WBC 8.9 9.3  HGB 11.6* 12.0*  HCT 32.5* 33.8*  MCV 84.2 84.3   PLT 261 0000000   Basic Metabolic Panel Recent Labs    10/30/21 0430 10/31/21 0500  NA 129* 126*  K 4.6 4.3  CL 90* 87*  CO2 27 30  GLUCOSE 259* 212*  BUN 33* 38*  CREATININE 1.43* 1.46*  CALCIUM 8.4* 8.5*   Liver Function Tests No results for input(s): AST, ALT, ALKPHOS, BILITOT, PROT, ALBUMIN in the last 72 hours. No results for input(s): LIPASE, AMYLASE in the last 72 hours. Cardiac Enzymes No results for input(s): CKTOTAL, CKMB, CKMBINDEX, TROPONINI in the last 72 hours.  BNP: BNP (last 3 results) Recent Labs    10/10/21 1606 10/22/21 1623 10/24/21 2150  BNP 1,564.6* 1,226.7* 1,306.1*    ProBNP (last 3 results) No results for input(s): PROBNP in the last 8760 hours.   D-Dimer No results for input(s): DDIMER in the last 72 hours. Hemoglobin A1C No results for input(s): HGBA1C in the last 72 hours. Fasting Lipid Panel No results for input(s): CHOL, HDL, LDLCALC, TRIG, CHOLHDL, LDLDIRECT in the last 72 hours. Thyroid Function Tests No results for input(s): TSH, T4TOTAL, T3FREE, THYROIDAB in the last 72 hours.  Invalid input(s): FREET3  Other results:   Imaging    VAS US DOPPLER PRE CABG  Result Date: 10/30/2021 PREOPERATIVE VASCULAR EVALUATION Patient Name:  Keith Jenkins  Date of Exam:   10/30/2021 Medical Rec #: [id number]     Accession #:  [id number] Date of Birth: [birth year]     Patient Gender: M Patient Age:   76 years Exam Location:  [facility] Procedure:      VAS US DOPPLER PRE CABG Referring Phys: Gilford Raid --------------------------------------------------------------------------------  Indications:      Pre-op MVR. Risk Factors:     Hyperlipidemia, past history of smoking, prior MI. Comparison Study: No prior studies. Performing Technologist: Darlin Coco RDMS, RVT  Examination Guidelines: A complete evaluation includes B-mode imaging, spectral Doppler, color Doppler, and power Doppler as needed of all accessible portions of each vessel.  Bilateral testing is considered an integral part of a complete examination. Limited examinations for reoccurring indications may be performed as noted.  Right Carotid Findings: +----------+--------+--------+--------+---------------------+------------------+           PSV cm/sEDV cm/sStenosisDescribe             Comments           +----------+--------+--------+--------+---------------------+------------------+ CCA Prox  77      19                                                      +----------+--------+--------+--------+---------------------+------------------+ CCA Distal60      21                                   intimal thickening +----------+--------+--------+--------+---------------------+------------------+ ICA Prox  144     36      1-39%   calcific,                                                                 heterogenous and                                                          hypoechoic                              +----------+--------+--------+--------+---------------------+------------------+ ICA Distal79      28                                                      +----------+--------+--------+--------+---------------------+------------------+ ECA       65      9                                                       +----------+--------+--------+--------+---------------------+------------------+ +----------+--------+-------+----------------+------------+           PSV cm/sEDV cmsDescribe        Arm Pressure +----------+--------+-------+----------------+------------+ Subclavian50  Multiphasic, WNL             +----------+--------+-------+----------------+------------+ +---------+--------+--+--------+-+---------+ VertebralPSV cm/s29EDV cm/s8Antegrade +---------+--------+--+--------+-+---------+ Left Carotid Findings: +----------+--------+--------+--------+------------+------------------+           PSV cm/sEDV  cm/sStenosisDescribe    Comments           +----------+--------+--------+--------+------------+------------------+ CCA Prox  77      22                                             +----------+--------+--------+--------+------------+------------------+ CCA Distal58      21                          intimal thickening +----------+--------+--------+--------+------------+------------------+ ICA Prox  65      30      1-39%   heterogenous                   +----------+--------+--------+--------+------------+------------------+ ICA Distal90      30                                             +----------+--------+--------+--------+------------+------------------+ ECA       62                                                     +----------+--------+--------+--------+------------+------------------+ +----------+--------+--------+----------------+------------+ SubclavianPSV cm/sEDV cm/sDescribe        Arm Pressure +----------+--------+--------+----------------+------------+           57              Multiphasic, WNL             +----------+--------+--------+----------------+------------+ +---------+--------+--+--------+--+---------+ VertebralPSV cm/s50EDV cm/s15Antegrade +---------+--------+--+--------+--+---------+  ABI Findings: +--------+------------------+-----+---------+--------+ Right   Rt Pressure (mmHg)IndexWaveform Comment  +--------+------------------+-----+---------+--------+ Brachial                       triphasic         +--------+------------------+-----+---------+--------+ +--------+------------------+-----+---------+-------+ Left    Lt Pressure (mmHg)IndexWaveform Comment +--------+------------------+-----+---------+-------+ Brachial                       triphasic        +--------+------------------+-----+---------+-------+  Right Doppler Findings: +--------+--------+-----+---------+--------+ Site    PressureIndexDoppler   Comments +--------+--------+-----+---------+--------+ Brachial             triphasic         +--------+--------+-----+---------+--------+ Radial               triphasic         +--------+--------+-----+---------+--------+ Ulnar                triphasic         +--------+--------+-----+---------+--------+  Left Doppler Findings: +--------+--------+-----+---------+--------+ Site    PressureIndexDoppler  Comments +--------+--------+-----+---------+--------+ Brachial             triphasic         +--------+--------+-----+---------+--------+ Radial               triphasic         +--------+--------+-----+---------+--------+ Ulnar  triphasic         +--------+--------+-----+---------+--------+  Summary: Right Carotid: Velocities in the right ICA are consistent with a 1-39% stenosis. Left Carotid: Velocities in the left ICA are consistent with a 1-39% stenosis. Vertebrals:  Bilateral vertebral arteries demonstrate antegrade flow. Subclavians: Normal flow hemodynamics were seen in bilateral subclavian              arteries. Right Upper Extremity: Doppler waveform obliterate with right radial compression. Doppler waveforms remain within normal limits with right ulnar compression. Left Upper Extremity: Doppler waveforms remain within normal limits with left radial compression. Doppler waveforms remain within normal limits with left ulnar compression.  Electronically signed by Harold Barban MD on 10/30/2021 at 8:54:14 PM.    Final      Medications:     Scheduled Medications:  aspirin EC  81 mg Oral Daily   atorvastatin  80 mg Oral Daily   Chlorhexidine Gluconate Cloth  6 each Topical Daily   dapagliflozin propanediol  10 mg Oral Daily   digoxin  0.125 mg Oral Daily   escitalopram  10 mg Oral Daily   feeding supplement  237 mL Oral BID BM   furosemide  80 mg Intravenous Daily   gabapentin  300 mg Oral QHS   isosorbide mononitrate  60 mg Oral Daily    latanoprost  1 drop Both Eyes QHS   pantoprazole  80 mg Oral Q1200   sodium chloride flush  10-40 mL Intracatheter Q12H   sodium chloride flush  3 mL Intravenous Q12H   spironolactone  25 mg Oral Daily    Infusions:  sodium chloride     amiodarone 30 mg/hr (10/31/21 0528)   heparin 1,600 Units/hr (10/31/21 0528)   milrinone 0.25 mcg/kg/min (10/31/21 0528)    PRN Medications: sodium chloride, acetaminophen, albuterol, guaiFENesin-dextromethorphan, loperamide, morphine injection, nitroGLYCERIN, ondansetron (ZOFRAN) IV, sodium chloride flush, sodium chloride flush, zolpidem    Patient Profile   76 y.o. male with hx COPD, prediabetes, HLD, GERD. Recent admit earlier this month with later presentation inferior STEMI and new systolic CHF w/ moderate to severe ischemic MR.   Readmitted with a/c CHF.  Assessment/Plan   1. CAD/recent late presentation inferior STEMI - LHC 05/04: 100% m RCA and 90% m LAD.  - RCA managed medically d/t completed infarct.  - Echo (5/23) EF 25%, RV moderately reduced, moderate to severe MR - Initial plan for PCI to LAD but with moderate to severe ischemic MR  CABG (LIMA to LAD)/MVR felt to be better option. Films reviewed with Dr. Burt Knack. LAD not ideal for PCI. Had planned for surgery in a few weeks once recovered from MI. However, now readmitted with a/c CHF. Plan for surgery 05/26. - Exertional CP on admit with HS troponin 858>>825. Recurrent chest pain on 5/23 HS Trop 422>395. - Chest pain resolved. Continue  imdur 60 mg daily - Continue heparin gtt. - Continue aspirin and statin. No beta blocker with suspected low-output.   2. Acute on chronic systolic CHF with low-output/ICM - In setting of late presentation inferior STEMI 05/23 - Echo 05/23 EF 25-30%, RV moderately reduced, moderate to severe MR. - Limited echo on admit EF 30-35%, RV moderately reduced, MR not well evaluated, moderate pericardial effusion - NYHA IIIb -  CO-OX okay on admit but  appeared low-output. Now on milrinone 0.25.  - CO-OX 70% on milrinone 0.25.  - CVP 5-6. Give 1 more dose of IV lasix today then stop. Surgery tomorrow am. - No BB with  suspected low-output - Continue digoxin 0.125 mg daily. - Continue spiro 25 mg daily. - Continue Farxiga 10 mg daily - Losartan held d/t soft BP - No BP room to titrate GDMT   3. Mitral regurgitation - Moderate to severe ischemic MR on TEE.  - Plan for CABG and MVR tomorrow   4. Aortic valve insufficiency - Mild to moderate AI - Will need to follow.   5. Pericardial effusion - Moderate in size on echo, no respiratory variation or RV collapse to suggest tamponade   6. H/o AKI - Scr stable 1.0>1.16>1.33>1.4 >1.4>1.46 - Watch renal function with diuresis   7. Prediabetes - A1c 5.8 1/23 - Continue Farxiga   8. PAF Maintaining SR.  Continue amio drip.  Continue heparin drip.    9. Hyponatremia  Sodium 126 Restrict free water.  Recheck this afternoon. Will need Tolvaptan if < or = 125.  One more dose of IV lasix today. Recheck BMET this afternoon.   Continue to mobilize.   Length of Stay: 7  FINCH, LINDSAY N, PA-C  10/31/2021, 8:08 AM  Advanced Heart Failure Team Pager [phone] (M-F; 7a - 5p)  Please contact [facility] for night-coverage after hours (5p -7a ) and weekends on [email or web address]  Patient seen and examined with the above-signed Advanced Practice Provider and/or Housestaff. I personally reviewed laboratory data, imaging studies and relevant notes. I independently examined the patient and formulated the important aspects of the plan. I have edited the note to reflect any of my changes or salient points. I have personally discussed the plan with the patient and/or family.  Remains on milrinone and IV lasix. Co-ox and VP improved. Feeling better. Less dyspneic. No CP. Able to ambulate  General:  Sitting up. No resp difficulty HEENT: normal Neck: supple. no JVD. Carotids 2+ bilat; no bruits. No  lymphadenopathy or thryomegaly appreciated. Cor: PMI nondisplaced. Regular rate & rhythm. 2/6 MR Lungs: clear Abdomen: soft, nontender, nondistended. No hepatosplenomegaly. No bruits or masses. Good bowel sounds. Extremities: no cyanosis, clubbing, rash, edema Neuro: alert & orientedx3, cranial nerves grossly intact. moves all 4 extremities w/o difficulty. Affect pleasant  Agree with plan for one more dose of IV lasix. Continue milrinone. Ambulate. Pulmonary toilet. For OR tomorrow for MVR/LIMA +/- Maze  Glori Bickers, MD  8:53 AM

## 2021-10-31 NOTE — H&P (View-Only) (Signed)
Procedure(s) (LRB): MITRAL VALVE (MV) REPLACEMENT (N/A) CORONARY ARTERY BYPASS GRAFTING (CABG) (N/A) MAZE (N/A) TRANSESOPHAGEAL ECHOCARDIOGRAM (TEE) (N/A) Subjective: Feels better today. Walked in hall, no SOB or chest pain. Ate some.  Objective: Vital signs in last 24 hours: Temp:  [97.9 F (36.6 C)-99.5 F (37.5 C)] 99.5 F (37.5 C) (05/25 1139) Pulse Rate:  [79-86] 79 (05/25 1139) Cardiac Rhythm: Normal sinus rhythm (05/25 1300) Resp:  [15-20] 20 (05/25 1139) BP: (90-104)/(62-72) 90/62 (05/25 1139) SpO2:  [90 %-96 %] 96 % (05/25 1139) Weight:  [93.1 kg] 93.1 kg (05/25 0222)  Hemodynamic parameters for last 24 hours: CVP:  [7 mmHg-9 mmHg] 7 mmHg  Intake/Output from previous day: 05/24 0701 - 05/25 0700 In: 1701 [P.O.:720; I.V.:981] Out: 1450 [Urine:1450] Intake/Output this shift: Total I/O In: -  Out: 700 [Urine:700]  General appearance: alert and cooperative Heart: regular rate and rhythm, S1, S2 normal, no murmur Lungs: clear to auscultation bilaterally  Lab Results: Recent Labs    10/30/21 0430 10/31/21 0500  WBC 8.9 9.3  HGB 11.6* 12.0*  HCT 32.5* 33.8*  PLT 261 261   BMET:  Recent Labs    10/30/21 0430 10/31/21 0500  NA 129* 126*  K 4.6 4.3  CL 90* 87*  CO2 27 30  GLUCOSE 259* 212*  BUN 33* 38*  CREATININE 1.43* 1.46*  CALCIUM 8.4* 8.5*    PT/INR: No results for input(s): LABPROT, INR in the last 72 hours. ABG    Component Value Date/Time   O2SAT 69.5 10/31/2021 0445   CBG (last 3)  No results for input(s): GLUCAP in the last 72 hours.  Assessment/Plan:  He has been hemodynamically stable on milrinone 0.25 but BP still marginal at 90-100. Co-ox 69.5 this am.  Creat inching up to 1.46 today with diuresis. Na down to 126.   Will plan to proceed with CABG/MVR and MAZE tomorrow.   I discussed the operative procedure with the patient and his son including alternatives, benefits and risks; including but not limited to bleeding, blood  transfusion, infection, stroke, myocardial infarction, graft failure, heart block requiring a permanent pacemaker, organ dysfunction, and death.  Mechele Collin understands and agrees to proceed.         LOS: 7 days    Gaye Pollack 10/31/2021

## 2021-10-31 NOTE — TOC Initial Note (Signed)
Transition of Care [facility]) - Initial/Assessment Note    Patient Details  Name: Keith Jenkins MRN: [id number] Date of Birth: 06-29-1945  [facility] CM/SW Contact:    Erenest Rasher, RN Phone Number: [phone] 10/31/2021, 2:45 PM  Clinical Narrative:                 HF TOC CM spoke to pt at bedside. Pt lives at home with wife. Pt has a dtr who is an EMS and she assist with medications and transportation to appts. Pt has declined HH in the past [place] Explained PT/OT will evaluation and make recommendations. Pt maybe need IP rehab. Will continue to follow for dc needs.   Will request meds come up from [place] at dc.   Expected Discharge Plan: [place] Barriers to Discharge: Continued Medical Work up   Patient Goals and CMS Choice Patient states their goals for this hospitalization and ongoing recovery are:: wants to get better CMS Medicare.gov Compare Post Acute Care list provided to:: Patient Choice offered to / list presented to : Patient  Expected Discharge Plan and Services Expected Discharge Plan: [place]   Discharge Planning Services: CM Consult Post Acute Care Choice: [place] arrangements for the past 2 months: Single Family Home                                      Prior Living Arrangements/Services Living arrangements for the past 2 months: Single Family Home Lives with:: Spouse Patient language and need for interpreter reviewed:: Yes Do you feel safe going back to the place where you live?: Yes      Need for Family Participation in Patient Care: Yes (Comment) Care giver support system in place?: Yes (comment) Current home services:  (cane) Criminal Activity/Legal Involvement Pertinent to Current Situation/Hospitalization: No - Comment as needed  Activities of Daily Living Home Assistive Devices/Equipment: Cane (specify quad or straight) ADL  Screening (condition at time of admission) Patient's cognitive ability adequate to safely complete daily activities?: Yes Is the patient deaf or have difficulty hearing?: No Does the patient have difficulty seeing, even when wearing glasses/contacts?: No Does the patient have difficulty concentrating, remembering, or making decisions?: No Patient able to express need for assistance with ADLs?: Yes Does the patient have difficulty dressing or bathing?: No Independently performs ADLs?: Yes (appropriate for developmental age) Does the patient have difficulty walking or climbing stairs?: No Weakness of Legs: None Weakness of Arms/Hands: None  Permission Sought/Granted Permission sought to share information with : Case Manager, Family Supports, PCP Permission granted to share information with : Yes, Verbal Permission Granted  Share Information with NAME: Rashun Mckeough     Permission granted to share info w Relationship: wife  Permission granted to share info w Contact Information: [phone]  Emotional Assessment Appearance:: Appears stated age Attitude/Demeanor/Rapport: Engaged Affect (typically observed): Accepting Orientation: : Oriented to Self, Oriented to Place, Oriented to  Time, Oriented to Situation   Psych Involvement: No (comment)  Admission diagnosis:  Acute on chronic combined systolic (congestive) and diastolic (congestive) heart failure (HCC) [I50.43] Patient Active Problem List   Diagnosis Date Noted   Acute on chronic combined systolic (congestive) and diastolic (congestive) heart failure (HCC) 10/24/2021   Pericardial effusion    Severe mitral regurgitation  NSTEMI (non-ST elevated myocardial infarction) ([place]) 10/10/2021   Non-ST elevation (NSTEMI) myocardial infarction ([place]) [id number]   Acute systolic heart failure ([place]) 10/10/2021   Hyperlipidemia 10/10/2021   PCP:  Rip Harbour, NP Pharmacy:   [facility] [place] [address] Phone: [phone] Fax: [phone]  [facility] [address] [address] Phone: [phone] Fax: [phone]     Social Determinants of Health (SDOH) Interventions    Readmission Risk Interventions     View : No data to display.

## 2021-10-31 NOTE — Progress Notes (Signed)
ANTICOAGULATION CONSULT NOTE  Pharmacy Consult for heparin Indication: chest pain/ACS/aflutter   No Known Allergies  Patient Measurements: Height: 6\' 1"  (185.4 cm) Weight: 93.1 kg (205 lb 3.2 oz) IBW/kg (Calculated) : 79.9 Heparin Dosing Weight: 98kg  Vital Signs: Temp: 99.5 F (37.5 C) (05/25 1139) Temp Source: Oral (05/25 1139) BP: 90/62 (05/25 1139) Pulse Rate: 79 (05/25 1139)  Labs: Recent Labs    10/29/21 0540 10/29/21 0739 10/29/21 0908 10/30/21 0430 10/30/21 1515 10/31/21 0500  HGB 12.2*  --   --  11.6*  --  12.0*  HCT 34.9*  --   --  32.5*  --  33.8*  PLT 319  --   --  261  --  261  HEPARINUNFRC 0.64  --   --  0.95* 0.65 0.56  CREATININE 1.42*  --   --  1.43*  --  1.46*  TROPONINIHS  --  422* 395*  --   --   --      Estimated Creatinine Clearance: 50.2 mL/min (A) (by C-G formula based on SCr of 1.46 mg/dL (H)).  Assessment: 76 y.o. male with PAF and chest pain s/ cath, awaiting possible CABG, got heparin  Heparin level 0.56 at goal on heparin drip rate 1600 units/hr No signs/symptoms of bleed Cbc stable   Goal of Therapy:  Heparin level 0.3-0.7 units/ml Monitor platelets by anticoagulation protocol: Yes   Plan:  Continue  Heparin 1600 units/hrDaily heparin level and CBC ordered Monitor for signs/symptoms of bleed   68 Pharm.D. CPP, BCPS Clinical Pharmacist [phone] 10/31/2021 3:06 PM    Please check AMION for all [facility] Pharmacy numbers After 10:00 PM, call Main Pharmacy [phone]

## 2021-10-31 NOTE — Progress Notes (Signed)
Physical Therapy Treatment Patient Details Name: Keith Jenkins MRN: [id number] DOB: Nov 10, 1945 Today's Date: 10/31/2021   History of Present Illness 76 yo admitted 5/18 with SOB and chest pain after recent STEMI.  Pt with CHF and Aflutter. Plan for CABG and MVR 5/26.  PMHx:pre diabetes, HLD, GERD, arthritis, glaucoma    PT Comments    Pt received supine and agreeable to session with good progress towards acute goals with focus on adherence to sternal precautions in anticipation of sx tomorrow. Pt able to demonstrate log roll technique and "move in the tube" to come to sitting EOB with bed flat and come to standing with good compliance with min guard throughout. Pt with increased tolerance for ambulation this session without AD with fair stability with x3 standing recovery breaks needed secondary to fatigue. Pt able to complete 5xSTS with increased time from recliner with good compliance with sternal precautions. Pt verbalizing understanding of sternal precautions and activity recommendations. Pt continues to benefit from skilled PT services to progress toward functional mobility goals.   HR max during ambulation: 98bpm    Recommendations for follow up therapy are one component of a multi-disciplinary discharge planning process, led by the attending physician.  Recommendations may be updated based on patient status, additional functional criteria and insurance authorization.  Follow Up Recommendations  Home health PT     Assistance Recommended at Discharge Intermittent Supervision/Assistance  Patient can return home with the following A little help with walking and/or transfers;A little help with bathing/dressing/bathroom;Assistance with cooking/housework;Assist for transportation;Help with stairs or ramp for entrance   Equipment Recommendations  None recommended by PT (will review after his surgery 5/26)    Recommendations for Other Services       Precautions / Restrictions  Precautions Precautions: Fall Precaution Comments: monitor lines Restrictions Weight Bearing Restrictions: No     Mobility  Bed Mobility Overal bed mobility: Needs Assistance Bed Mobility: Rolling, Sidelying to Sit Rolling: Min guard Sidelying to sit: Min guard       General bed mobility comments: min guard with light cues for log rolling and "move in the tube" technique with no physical asssit needed from flat bed    Transfers Overall transfer level: Needs assistance Equipment used: None Transfers: Sit to/from Stand Sit to Stand: Min guard           General transfer comment: min guard for safety with light cues for hand placement on thighs in preperation for post CABG precautions. STS x5 from relciner with light facilitation on back for anterior weight shift during first attempt, no physical assist needed for last 4    Ambulation/Gait Ambulation/Gait assistance: Supervision, Min guard Gait Distance (Feet): 500 Feet Assistive device: None Gait Pattern/deviations: Step-through pattern, Decreased stride length, Wide base of support Gait velocity: reduced     General Gait Details: slightly unsteady gait with pt weaving slightly R/L throughout but able to self correct and no LOB, pt requiring standing rest break x3 throughout secondary to fatigue   Stairs Stairs: Yes Stairs assistance: Min assist Stair Management: Forwards, One rail Left, One rail Right, Step to pattern Number of Stairs: 2 General stair comments: pt able to ascend/descend stairs with railing with fair stability noLOB, min assist for lines and to steady   Wheelchair Mobility    Modified Rankin (Stroke Patients Only)       Balance Overall balance assessment: Mild deficits observed, not formally tested Sitting-balance support: Feet supported, No upper extremity supported Sitting balance-Leahy Scale: Good Sitting balance -  Comments: pt with good sitting balance   Standing balance support:  Single extremity supported Standing balance-Leahy Scale: Fair                              Cognition Arousal/Alertness: Awake/alert Behavior During Therapy: WFL for tasks assessed/performed Overall Cognitive Status: Within Functional Limits for tasks assessed                                          Exercises      General Comments        Pertinent Vitals/Pain      Home Living                          Prior Function            PT Goals (current goals can now be found in the care plan section) Acute Rehab PT Goals Patient Stated Goal: home and resume usual activities after healing process PT Goal Formulation: With patient Time For Goal Achievement: 11/13/21    Frequency    Min 3X/week      PT Plan Discharge plan needs to be updated    Co-evaluation              AM-PAC PT "6 Clicks" Mobility   Outcome Measure  Help needed turning from your back to your side while in a flat bed without using bedrails?: None Help needed moving from lying on your back to sitting on the side of a flat bed without using bedrails?: A Little Help needed moving to and from a bed to a chair (including a wheelchair)?: A Little Help needed standing up from a chair using your arms (e.g., wheelchair or bedside chair)?: A Little Help needed to walk in hospital room?: A Little Help needed climbing 3-5 steps with a railing? : A Little 6 Click Score: 19    End of Session Equipment Utilized During Treatment: Gait belt Activity Tolerance: Patient tolerated treatment well Patient left: in chair;with call bell/phone within reach Nurse Communication: Mobility status PT Visit Diagnosis: Other abnormalities of gait and mobility (R26.89);Muscle weakness (generalized) (M62.81)     Time: [id number] PT Time Calculation (min) (ACUTE ONLY): 38 min  Charges:  $Gait Training: 23-37 mins $Therapeutic Exercise: 8-22 mins                     Latasha Puskas R.  PTA Acute Rehabilitation Services Office: [place] 10/31/2021, 10:52 AM

## 2021-11-01 ENCOUNTER — Inpatient Hospital Stay (HOSPITAL_COMMUNITY): Payer: Medicare HMO

## 2021-11-01 ENCOUNTER — Encounter (HOSPITAL_COMMUNITY)
Admission: AD | Disposition: A | Discharge status: Home / Self Care | Payer: Self-pay | Source: Other Acute Inpatient Hospital | Attending: Surgery

## 2021-11-01 ENCOUNTER — Encounter (HOSPITAL_COMMUNITY): Payer: Self-pay | Admitting: Cardiology

## 2021-11-01 ENCOUNTER — Inpatient Hospital Stay (HOSPITAL_COMMUNITY): Payer: Medicare HMO | Admitting: Anesthesiology

## 2021-11-01 ENCOUNTER — Other Ambulatory Visit: Payer: Self-pay

## 2021-11-01 DIAGNOSIS — I2510 Atherosclerotic heart disease of native coronary artery without angina pectoris: Secondary | ICD-10-CM | POA: Diagnosis not present

## 2021-11-01 DIAGNOSIS — I350 Nonrheumatic aortic (valve) stenosis: Secondary | ICD-10-CM | POA: Diagnosis not present

## 2021-11-01 DIAGNOSIS — Z952 Presence of prosthetic heart valve: Secondary | ICD-10-CM

## 2021-11-01 DIAGNOSIS — J90 Pleural effusion, not elsewhere classified: Secondary | ICD-10-CM

## 2021-11-01 DIAGNOSIS — I340 Nonrheumatic mitral (valve) insufficiency: Secondary | ICD-10-CM | POA: Diagnosis present

## 2021-11-01 DIAGNOSIS — I509 Heart failure, unspecified: Secondary | ICD-10-CM | POA: Diagnosis not present

## 2021-11-01 DIAGNOSIS — I5043 Acute on chronic combined systolic (congestive) and diastolic (congestive) heart failure: Secondary | ICD-10-CM | POA: Diagnosis not present

## 2021-11-01 DIAGNOSIS — I080 Rheumatic disorders of both mitral and aortic valves: Secondary | ICD-10-CM | POA: Diagnosis not present

## 2021-11-01 DIAGNOSIS — I4891 Unspecified atrial fibrillation: Secondary | ICD-10-CM | POA: Diagnosis not present

## 2021-11-01 HISTORY — PX: CLIPPING OF ATRIAL APPENDAGE: SHX5773

## 2021-11-01 HISTORY — PX: MAZE: SHX5063

## 2021-11-01 HISTORY — PX: TEE WITHOUT CARDIOVERSION: SHX5443

## 2021-11-01 HISTORY — PX: MITRAL VALVE REPLACEMENT: SHX147

## 2021-11-01 HISTORY — PX: CORONARY ARTERY BYPASS GRAFT: SHX141

## 2021-11-01 LAB — POCT I-STAT 7, (LYTES, BLD GAS, ICA,H+H)
Acid-Base Excess: 4 mmol/L — ABNORMAL HIGH (ref 0.0–2.0)
Acid-Base Excess: 6 mmol/L — ABNORMAL HIGH (ref 0.0–2.0)
Acid-Base Excess: 3 mmol/L — ABNORMAL HIGH (ref 0.0–2.0)
Acid-base deficit: 6 mmol/L — ABNORMAL HIGH (ref 0.0–2.0)
Bicarbonate: 29.9 mmol/L — ABNORMAL HIGH (ref 20.0–28.0)
Bicarbonate: 31.5 mmol/L — ABNORMAL HIGH (ref 20.0–28.0)
Bicarbonate: 28.3 mmol/L — ABNORMAL HIGH (ref 20.0–28.0)
Bicarbonate: 20.3 mmol/L (ref 20.0–28.0)
Calcium, Ion: 1.19 mmol/L (ref 1.15–1.40)
Calcium, Ion: 1.04 mmol/L — ABNORMAL LOW (ref 1.15–1.40)
Calcium, Ion: 1.26 mmol/L (ref 1.15–1.40)
Calcium, Ion: 1.19 mmol/L (ref 1.15–1.40)
HCT: 38 % — ABNORMAL LOW (ref 39.0–52.0)
HCT: 30 % — ABNORMAL LOW (ref 39.0–52.0)
HCT: 26 % — ABNORMAL LOW (ref 39.0–52.0)
HCT: 34 % — ABNORMAL LOW (ref 39.0–52.0)
Hemoglobin: 12.9 g/dL — ABNORMAL LOW (ref 13.0–17.0)
Hemoglobin: 10.2 g/dL — ABNORMAL LOW (ref 13.0–17.0)
Hemoglobin: 8.8 g/dL — ABNORMAL LOW (ref 13.0–17.0)
Hemoglobin: 11.6 g/dL — ABNORMAL LOW (ref 13.0–17.0)
O2 Saturation: 99 %
O2 Saturation: 100 %
O2 Saturation: 100 %
O2 Saturation: 99 %
Patient temperature: 36.8
Potassium: 4.5 mmol/L (ref 3.5–5.1)
Potassium: 4.1 mmol/L (ref 3.5–5.1)
Potassium: 4.1 mmol/L (ref 3.5–5.1)
Potassium: 4.4 mmol/L (ref 3.5–5.1)
Sodium: 126 mmol/L — ABNORMAL LOW (ref 135–145)
Sodium: 126 mmol/L — ABNORMAL LOW (ref 135–145)
Sodium: 128 mmol/L — ABNORMAL LOW (ref 135–145)
Sodium: 129 mmol/L — ABNORMAL LOW (ref 135–145)
TCO2: 31 mmol/L (ref 22–32)
TCO2: 33 mmol/L — ABNORMAL HIGH (ref 22–32)
TCO2: 30 mmol/L (ref 22–32)
TCO2: 22 mmol/L (ref 22–32)
pCO2 arterial: 48.9 mmHg — ABNORMAL HIGH (ref 32–48)
pCO2 arterial: 48.7 mmHg — ABNORMAL HIGH (ref 32–48)
pCO2 arterial: 45.4 mmHg (ref 32–48)
pCO2 arterial: 43.7 mmHg (ref 32–48)
pH, Arterial: 7.394 (ref 7.35–7.45)
pH, Arterial: 7.419 (ref 7.35–7.45)
pH, Arterial: 7.403 (ref 7.35–7.45)
pH, Arterial: 7.274 — ABNORMAL LOW (ref 7.35–7.45)
pO2, Arterial: 163 mmHg — ABNORMAL HIGH (ref 83–108)
pO2, Arterial: 362 mmHg — ABNORMAL HIGH (ref 83–108)
pO2, Arterial: 427 mmHg — ABNORMAL HIGH (ref 83–108)
pO2, Arterial: 182 mmHg — ABNORMAL HIGH (ref 83–108)

## 2021-11-01 LAB — POCT I-STAT, CHEM 8
BUN: 35 mg/dL — ABNORMAL HIGH (ref 8–23)
BUN: 34 mg/dL — ABNORMAL HIGH (ref 8–23)
BUN: 32 mg/dL — ABNORMAL HIGH (ref 8–23)
BUN: 33 mg/dL — ABNORMAL HIGH (ref 8–23)
BUN: 31 mg/dL — ABNORMAL HIGH (ref 8–23)
BUN: 34 mg/dL — ABNORMAL HIGH (ref 8–23)
BUN: 31 mg/dL — ABNORMAL HIGH (ref 8–23)
Calcium, Ion: 1.16 mmol/L (ref 1.15–1.40)
Calcium, Ion: 1.16 mmol/L (ref 1.15–1.40)
Calcium, Ion: 1.03 mmol/L — ABNORMAL LOW (ref 1.15–1.40)
Calcium, Ion: 1.07 mmol/L — ABNORMAL LOW (ref 1.15–1.40)
Calcium, Ion: 1.07 mmol/L — ABNORMAL LOW (ref 1.15–1.40)
Calcium, Ion: 1.09 mmol/L — ABNORMAL LOW (ref 1.15–1.40)
Calcium, Ion: 1.22 mmol/L (ref 1.15–1.40)
Chloride: 88 mmol/L — ABNORMAL LOW (ref 98–111)
Chloride: 88 mmol/L — ABNORMAL LOW (ref 98–111)
Chloride: 89 mmol/L — ABNORMAL LOW (ref 98–111)
Chloride: 89 mmol/L — ABNORMAL LOW (ref 98–111)
Chloride: 90 mmol/L — ABNORMAL LOW (ref 98–111)
Chloride: 91 mmol/L — ABNORMAL LOW (ref 98–111)
Chloride: 91 mmol/L — ABNORMAL LOW (ref 98–111)
Creatinine, Ser: 1.4 mg/dL — ABNORMAL HIGH (ref 0.61–1.24)
Creatinine, Ser: 1.4 mg/dL — ABNORMAL HIGH (ref 0.61–1.24)
Creatinine, Ser: 1.2 mg/dL (ref 0.61–1.24)
Creatinine, Ser: 1.2 mg/dL (ref 0.61–1.24)
Creatinine, Ser: 1.2 mg/dL (ref 0.61–1.24)
Creatinine, Ser: 1.2 mg/dL (ref 0.61–1.24)
Creatinine, Ser: 1.1 mg/dL (ref 0.61–1.24)
Glucose, Bld: 121 mg/dL — ABNORMAL HIGH (ref 70–99)
Glucose, Bld: 131 mg/dL — ABNORMAL HIGH (ref 70–99)
Glucose, Bld: 118 mg/dL — ABNORMAL HIGH (ref 70–99)
Glucose, Bld: 123 mg/dL — ABNORMAL HIGH (ref 70–99)
Glucose, Bld: 122 mg/dL — ABNORMAL HIGH (ref 70–99)
Glucose, Bld: 128 mg/dL — ABNORMAL HIGH (ref 70–99)
Glucose, Bld: 149 mg/dL — ABNORMAL HIGH (ref 70–99)
HCT: 37 % — ABNORMAL LOW (ref 39.0–52.0)
HCT: 34 % — ABNORMAL LOW (ref 39.0–52.0)
HCT: 30 % — ABNORMAL LOW (ref 39.0–52.0)
HCT: 28 % — ABNORMAL LOW (ref 39.0–52.0)
HCT: 28 % — ABNORMAL LOW (ref 39.0–52.0)
HCT: 28 % — ABNORMAL LOW (ref 39.0–52.0)
HCT: 27 % — ABNORMAL LOW (ref 39.0–52.0)
Hemoglobin: 12.6 g/dL — ABNORMAL LOW (ref 13.0–17.0)
Hemoglobin: 11.6 g/dL — ABNORMAL LOW (ref 13.0–17.0)
Hemoglobin: 10.2 g/dL — ABNORMAL LOW (ref 13.0–17.0)
Hemoglobin: 9.5 g/dL — ABNORMAL LOW (ref 13.0–17.0)
Hemoglobin: 9.5 g/dL — ABNORMAL LOW (ref 13.0–17.0)
Hemoglobin: 9.5 g/dL — ABNORMAL LOW (ref 13.0–17.0)
Hemoglobin: 9.2 g/dL — ABNORMAL LOW (ref 13.0–17.0)
Potassium: 4.5 mmol/L (ref 3.5–5.1)
Potassium: 4.4 mmol/L (ref 3.5–5.1)
Potassium: 4.6 mmol/L (ref 3.5–5.1)
Potassium: 4.7 mmol/L (ref 3.5–5.1)
Potassium: 4.4 mmol/L (ref 3.5–5.1)
Potassium: 4.9 mmol/L (ref 3.5–5.1)
Potassium: 4.1 mmol/L (ref 3.5–5.1)
Sodium: 126 mmol/L — ABNORMAL LOW (ref 135–145)
Sodium: 124 mmol/L — ABNORMAL LOW (ref 135–145)
Sodium: 125 mmol/L — ABNORMAL LOW (ref 135–145)
Sodium: 125 mmol/L — ABNORMAL LOW (ref 135–145)
Sodium: 126 mmol/L — ABNORMAL LOW (ref 135–145)
Sodium: 126 mmol/L — ABNORMAL LOW (ref 135–145)
Sodium: 127 mmol/L — ABNORMAL LOW (ref 135–145)
TCO2: 33 mmol/L — ABNORMAL HIGH (ref 22–32)
TCO2: 28 mmol/L (ref 22–32)
TCO2: 31 mmol/L (ref 22–32)
TCO2: 33 mmol/L — ABNORMAL HIGH (ref 22–32)
TCO2: 30 mmol/L (ref 22–32)
TCO2: 31 mmol/L (ref 22–32)
TCO2: 29 mmol/L (ref 22–32)

## 2021-11-01 LAB — POCT I-STAT EG7
Acid-Base Excess: 5 mmol/L — ABNORMAL HIGH (ref 0.0–2.0)
Bicarbonate: 30.9 mmol/L — ABNORMAL HIGH (ref 20.0–28.0)
Calcium, Ion: 1.09 mmol/L — ABNORMAL LOW (ref 1.15–1.40)
HCT: 28 % — ABNORMAL LOW (ref 39.0–52.0)
Hemoglobin: 9.5 g/dL — ABNORMAL LOW (ref 13.0–17.0)
O2 Saturation: 92 %
Potassium: 5.3 mmol/L — ABNORMAL HIGH (ref 3.5–5.1)
Sodium: 126 mmol/L — ABNORMAL LOW (ref 135–145)
TCO2: 32 mmol/L (ref 22–32)
pCO2, Ven: 52 mmHg (ref 44–60)
pH, Ven: 7.382 (ref 7.25–7.43)
pO2, Ven: 66 mmHg — ABNORMAL HIGH (ref 32–45)

## 2021-11-01 LAB — GLUCOSE, CAPILLARY
Glucose-Capillary: 156 mg/dL — ABNORMAL HIGH (ref 70–99)
Glucose-Capillary: 162 mg/dL — ABNORMAL HIGH (ref 70–99)
Glucose-Capillary: 176 mg/dL — ABNORMAL HIGH (ref 70–99)
Glucose-Capillary: 170 mg/dL — ABNORMAL HIGH (ref 70–99)
Glucose-Capillary: 119 mg/dL — ABNORMAL HIGH (ref 70–99)
Glucose-Capillary: 178 mg/dL — ABNORMAL HIGH (ref 70–99)
Glucose-Capillary: 217 mg/dL — ABNORMAL HIGH (ref 70–99)
Glucose-Capillary: 236 mg/dL — ABNORMAL HIGH (ref 70–99)

## 2021-11-01 LAB — CBC
HCT: 32.8 % — ABNORMAL LOW (ref 39.0–52.0)
HCT: 30.9 % — ABNORMAL LOW (ref 39.0–52.0)
HCT: 32.2 % — ABNORMAL LOW (ref 39.0–52.0)
Hemoglobin: 11.9 g/dL — ABNORMAL LOW (ref 13.0–17.0)
Hemoglobin: 10.7 g/dL — ABNORMAL LOW (ref 13.0–17.0)
Hemoglobin: 11.2 g/dL — ABNORMAL LOW (ref 13.0–17.0)
MCH: 30.4 pg (ref 26.0–34.0)
MCH: 29.8 pg (ref 26.0–34.0)
MCH: 30.2 pg (ref 26.0–34.0)
MCHC: 36.3 g/dL — ABNORMAL HIGH (ref 30.0–36.0)
MCHC: 34.6 g/dL (ref 30.0–36.0)
MCHC: 34.8 g/dL (ref 30.0–36.0)
MCV: 83.7 fL (ref 80.0–100.0)
MCV: 86.1 fL (ref 80.0–100.0)
MCV: 86.8 fL (ref 80.0–100.0)
Platelets: 239 10*3/uL (ref 150–400)
Platelets: 161 10*3/uL (ref 150–400)
Platelets: 182 10*3/uL (ref 150–400)
RBC: 3.92 MIL/uL — ABNORMAL LOW (ref 4.22–5.81)
RBC: 3.59 MIL/uL — ABNORMAL LOW (ref 4.22–5.81)
RBC: 3.71 MIL/uL — ABNORMAL LOW (ref 4.22–5.81)
RDW: 13.3 % (ref 11.5–15.5)
RDW: 13.3 % (ref 11.5–15.5)
RDW: 13.4 % (ref 11.5–15.5)
WBC: 8.6 10*3/uL (ref 4.0–10.5)
WBC: 27.2 10*3/uL — ABNORMAL HIGH (ref 4.0–10.5)
WBC: 23.7 10*3/uL — ABNORMAL HIGH (ref 4.0–10.5)
nRBC: 0 % (ref 0.0–0.2)
nRBC: 0 % (ref 0.0–0.2)
nRBC: 0 % (ref 0.0–0.2)

## 2021-11-01 LAB — BASIC METABOLIC PANEL
Anion gap: 9 (ref 5–15)
Anion gap: 14 (ref 5–15)
BUN: 37 mg/dL — ABNORMAL HIGH (ref 8–23)
BUN: 32 mg/dL — ABNORMAL HIGH (ref 8–23)
CO2: 28 mmol/L (ref 22–32)
CO2: 19 mmol/L — ABNORMAL LOW (ref 22–32)
Calcium: 8.4 mg/dL — ABNORMAL LOW (ref 8.9–10.3)
Calcium: 8.1 mg/dL — ABNORMAL LOW (ref 8.9–10.3)
Chloride: 86 mmol/L — ABNORMAL LOW (ref 98–111)
Chloride: 96 mmol/L — ABNORMAL LOW (ref 98–111)
Creatinine, Ser: 1.44 mg/dL — ABNORMAL HIGH (ref 0.61–1.24)
Creatinine, Ser: 1.37 mg/dL — ABNORMAL HIGH (ref 0.61–1.24)
GFR, Estimated: 51 mL/min — ABNORMAL LOW (ref >60)
GFR, Estimated: 54 mL/min — ABNORMAL LOW (ref >60)
Glucose, Bld: 149 mg/dL — ABNORMAL HIGH (ref 70–99)
Glucose, Bld: 181 mg/dL — ABNORMAL HIGH (ref 70–99)
Potassium: 4.3 mmol/L (ref 3.5–5.1)
Potassium: 4.4 mmol/L (ref 3.5–5.1)
Sodium: 123 mmol/L — ABNORMAL LOW (ref 135–145)
Sodium: 129 mmol/L — ABNORMAL LOW (ref 135–145)

## 2021-11-01 LAB — COOXEMETRY PANEL
Carboxyhemoglobin: 1.6 % — ABNORMAL HIGH (ref 0.5–1.5)
Methemoglobin: <0.7 % (ref 0.0–1.5)
O2 Saturation: 69.3 %
Total hemoglobin: 12.4 g/dL (ref 12.0–16.0)

## 2021-11-01 LAB — BLOOD GAS, ARTERIAL
Acid-Base Excess: 6.2 mmol/L — ABNORMAL HIGH (ref 0.0–2.0)
Bicarbonate: 30.6 mmol/L — ABNORMAL HIGH (ref 20.0–28.0)
Drawn by: 33099
O2 Saturation: 93.6 %
Patient temperature: 37
pCO2 arterial: 42 mmHg (ref 32–48)
pH, Arterial: 7.47 — ABNORMAL HIGH (ref 7.35–7.45)
pO2, Arterial: 67 mmHg — ABNORMAL LOW (ref 83–108)

## 2021-11-01 LAB — HEPARIN LEVEL (UNFRACTIONATED): Heparin Unfractionated: 0.41 IU/mL (ref 0.30–0.70)

## 2021-11-01 LAB — PROTIME-INR
INR: 1.7 — ABNORMAL HIGH (ref 0.8–1.2)
Prothrombin Time: 19.6 seconds — ABNORMAL HIGH (ref 11.4–15.2)

## 2021-11-01 LAB — HEMOGLOBIN AND HEMATOCRIT, BLOOD
HCT: 25.6 % — ABNORMAL LOW (ref 39.0–52.0)
Hemoglobin: 9.2 g/dL — ABNORMAL LOW (ref 13.0–17.0)

## 2021-11-01 LAB — ABO/RH: ABO/RH(D): O POS

## 2021-11-01 LAB — MAGNESIUM: Magnesium: 3.2 mg/dL — ABNORMAL HIGH (ref 1.7–2.4)

## 2021-11-01 LAB — PLATELET COUNT: Platelets: 138 10*3/uL — ABNORMAL LOW (ref 150–400)

## 2021-11-01 LAB — FIBRINOGEN: Fibrinogen: 336 mg/dL (ref 210–475)

## 2021-11-01 LAB — HEMOGLOBIN A1C
Hgb A1c MFr Bld: 6 % — ABNORMAL HIGH (ref 4.8–5.6)
Mean Plasma Glucose: 125.5 mg/dL

## 2021-11-01 LAB — APTT: aPTT: 40 seconds — ABNORMAL HIGH (ref 24–36)

## 2021-11-01 SURGERY — REPLACEMENT, MITRAL VALVE
Anesthesia: General | Site: Chest

## 2021-11-01 MED ORDER — FENTANYL CITRATE (PF) 250 MCG/5ML IJ SOLN
INTRAMUSCULAR | Status: DC | PRN
Start: 2021-11-01 — End: 2021-11-01
  Administered 2021-11-01 (×2): 100 ug via INTRAVENOUS
  Administered 2021-11-01: 125 ug via INTRAVENOUS
  Administered 2021-11-01 (×2): 100 ug via INTRAVENOUS
  Administered 2021-11-01: 25 ug via INTRAVENOUS

## 2021-11-01 MED ORDER — SODIUM CHLORIDE 0.45 % IV SOLN: INTRAVENOUS | Status: DC | PRN

## 2021-11-01 MED ORDER — SODIUM CHLORIDE 0.9 % IV SOLN: 250.0000 mL | INTRAVENOUS | Status: DC

## 2021-11-01 MED ORDER — MAGNESIUM SULFATE 4 GM/100ML IV SOLN
4.0000 g | Freq: Once | INTRAVENOUS | Status: AC
  Administered 2021-11-01: 4 g via INTRAVENOUS
  Filled 2021-11-01: qty 100

## 2021-11-01 MED ORDER — FENTANYL CITRATE (PF) 250 MCG/5ML IJ SOLN
INTRAMUSCULAR | Status: AC
  Filled 2021-11-01: qty 5

## 2021-11-01 MED ORDER — THROMBIN (RECOMBINANT) 20000 UNITS EX SOLR
CUTANEOUS | Status: AC
  Filled 2021-11-01: qty 20000

## 2021-11-01 MED ORDER — LACTATED RINGERS IV SOLN: INTRAVENOUS | Status: DC

## 2021-11-01 MED ORDER — DEXTROSE 50 % IV SOLN: 0.0000 mL | INTRAVENOUS | Status: DC | PRN

## 2021-11-01 MED ORDER — ACETAMINOPHEN 650 MG RE SUPP
650.0000 mg | Freq: Once | RECTAL | Status: AC
  Administered 2021-11-01: 650 mg via RECTAL

## 2021-11-01 MED ORDER — INSULIN REGULAR(HUMAN) IN NACL 100-0.9 UT/100ML-% IV SOLN: INTRAVENOUS | Status: DC

## 2021-11-01 MED ORDER — METOPROLOL TARTRATE 12.5 MG HALF TABLET: 12.5000 mg | ORAL_TABLET | Freq: Two times a day (BID) | ORAL | Status: DC

## 2021-11-01 MED ORDER — MIDAZOLAM HCL 2 MG/2ML IJ SOLN
2.0000 mg | INTRAMUSCULAR | Status: DC | PRN
  Administered 2021-11-01: 2 mg via INTRAVENOUS
  Filled 2021-11-01 (×2): qty 2

## 2021-11-01 MED ORDER — ASPIRIN 325 MG PO TBEC
325.0000 mg | DELAYED_RELEASE_TABLET | Freq: Every day | ORAL | Status: DC
  Administered 2021-11-02 – 2021-11-03 (×2): 325 mg via ORAL
  Filled 2021-11-01 (×2): qty 1

## 2021-11-01 MED ORDER — DOCUSATE SODIUM 100 MG PO CAPS
200.0000 mg | ORAL_CAPSULE | Freq: Every day | ORAL | Status: DC
  Administered 2021-11-02: 200 mg via ORAL
  Filled 2021-11-01 (×2): qty 2

## 2021-11-01 MED ORDER — PHENYLEPHRINE HCL-NACL 20-0.9 MG/250ML-% IV SOLN
0.0000 ug/min | INTRAVENOUS | Status: DC
  Administered 2021-11-01: 40 ug/min via INTRAVENOUS
  Administered 2021-11-01: 20 ug/min via INTRAVENOUS
  Filled 2021-11-01: qty 250

## 2021-11-01 MED ORDER — TRANEXAMIC ACID 1000 MG/10ML IV SOLN
1.5000 mg/kg/h | INTRAVENOUS | Status: DC
  Filled 2021-11-01: qty 25

## 2021-11-01 MED ORDER — HEPARIN SODIUM (PORCINE) 1000 UNIT/ML IJ SOLN
INTRAMUSCULAR | Status: AC
  Filled 2021-11-01: qty 10

## 2021-11-01 MED ORDER — LACTATED RINGERS IV SOLN: 500.0000 mL | Freq: Once | INTRAVENOUS | Status: DC | PRN

## 2021-11-01 MED ORDER — EPHEDRINE SULFATE-NACL 50-0.9 MG/10ML-% IV SOSY
PREFILLED_SYRINGE | INTRAVENOUS | Status: DC | PRN
  Administered 2021-11-01 (×3): 5 mg via INTRAVENOUS

## 2021-11-01 MED ORDER — PLASMA-LYTE A IV SOLN: INTRAVENOUS | Status: DC | PRN

## 2021-11-01 MED ORDER — AMIODARONE HCL IN DEXTROSE 360-4.14 MG/200ML-% IV SOLN
30.0000 mg/h | INTRAVENOUS | Status: AC
  Administered 2021-11-01 – 2021-11-02 (×3): 30 mg/h via INTRAVENOUS
  Filled 2021-11-01 (×3): qty 200

## 2021-11-01 MED ORDER — ROCURONIUM BROMIDE 10 MG/ML (PF) SYRINGE
PREFILLED_SYRINGE | INTRAVENOUS | Status: DC | PRN
  Administered 2021-11-01: 50 mg via INTRAVENOUS
  Administered 2021-11-01: 100 mg via INTRAVENOUS
  Administered 2021-11-01 (×4): 50 mg via INTRAVENOUS

## 2021-11-01 MED ORDER — PROPOFOL 10 MG/ML IV BOLUS
INTRAVENOUS | Status: AC
  Filled 2021-11-01: qty 20

## 2021-11-01 MED ORDER — ACETAMINOPHEN 500 MG PO TABS
1000.0000 mg | ORAL_TABLET | Freq: Four times a day (QID) | ORAL | Status: DC
  Filled 2021-11-01: qty 2

## 2021-11-01 MED ORDER — ATORVASTATIN CALCIUM 80 MG PO TABS
80.0000 mg | ORAL_TABLET | Freq: Every day | ORAL | Status: DC
  Administered 2021-11-02 – 2021-11-18 (×17): 80 mg via ORAL
  Filled 2021-11-01 (×17): qty 1

## 2021-11-01 MED ORDER — ROCURONIUM BROMIDE 10 MG/ML (PF) SYRINGE
PREFILLED_SYRINGE | INTRAVENOUS | Status: AC
Start: 2021-11-01 — End: ?
  Filled 2021-11-01: qty 10

## 2021-11-01 MED ORDER — LACTATED RINGERS IV SOLN: INTRAVENOUS | Status: DC | PRN

## 2021-11-01 MED ORDER — VANCOMYCIN HCL IN DEXTROSE 1-5 GM/200ML-% IV SOLN
1000.0000 mg | Freq: Once | INTRAVENOUS | Status: AC
  Administered 2021-11-01: 1000 mg via INTRAVENOUS
  Filled 2021-11-01: qty 200

## 2021-11-01 MED ORDER — LACTATED RINGERS IV SOLN
INTRAVENOUS | Status: DC | PRN
Start: 2021-11-01 — End: 2021-11-01

## 2021-11-01 MED ORDER — MORPHINE SULFATE (PF) 2 MG/ML IV SOLN
1.0000 mg | INTRAVENOUS | Status: DC | PRN
  Administered 2021-11-01: 2 mg via INTRAVENOUS
  Administered 2021-11-02: 4 mg via INTRAVENOUS
  Administered 2021-11-02: 2 mg via INTRAVENOUS
  Administered 2021-11-02 (×3): 4 mg via INTRAVENOUS
  Administered 2021-11-02 – 2021-11-03 (×6): 2 mg via INTRAVENOUS
  Filled 2021-11-01: qty 1
  Filled 2021-11-01 (×5): qty 2
  Filled 2021-11-01: qty 1
  Filled 2021-11-01 (×2): qty 2

## 2021-11-01 MED ORDER — METOPROLOL TARTRATE 25 MG/10 ML ORAL SUSPENSION: 12.5000 mg | Freq: Two times a day (BID) | ORAL | Status: DC

## 2021-11-01 MED ORDER — NOREPINEPHRINE 16 MG/250ML-% IV SOLN
0.0000 ug/min | INTRAVENOUS | Status: DC
  Administered 2021-11-01: 2 ug/min via INTRAVENOUS
  Administered 2021-11-02: 25 ug/min via INTRAVENOUS
  Administered 2021-11-02: 18 ug/min via INTRAVENOUS
  Administered 2021-11-03: 20 ug/min via INTRAVENOUS
  Administered 2021-11-04: 10 ug/min via INTRAVENOUS
  Administered 2021-11-04: 16 ug/min via INTRAVENOUS
  Administered 2021-11-05: 4 ug/min via INTRAVENOUS
  Administered 2021-11-09: 1 ug/min via INTRAVENOUS
  Filled 2021-11-01 (×8): qty 250

## 2021-11-01 MED ORDER — OXYCODONE HCL 5 MG PO TABS
5.0000 mg | ORAL_TABLET | ORAL | Status: DC | PRN
  Administered 2021-11-02 – 2021-11-10 (×5): 5 mg via ORAL
  Administered 2021-11-10 – 2021-11-14 (×3): 10 mg via ORAL
  Administered 2021-11-14 – 2021-11-15 (×2): 5 mg via ORAL
  Filled 2021-11-01 (×2): qty 1
  Filled 2021-11-01: qty 2
  Filled 2021-11-01 (×3): qty 1
  Filled 2021-11-01 (×3): qty 2
  Filled 2021-11-01 (×2): qty 1
  Filled 2021-11-01: qty 2

## 2021-11-01 MED ORDER — THROMBIN 20000 UNITS EX SOLR: CUTANEOUS | Status: DC | PRN

## 2021-11-01 MED ORDER — PHENYLEPHRINE 80 MCG/ML (10ML) SYRINGE FOR IV PUSH (FOR BLOOD PRESSURE SUPPORT)
PREFILLED_SYRINGE | INTRAVENOUS | Status: DC | PRN
Start: 2021-11-01 — End: 2021-11-01
  Administered 2021-11-01: 80 ug via INTRAVENOUS
  Administered 2021-11-01: 40 ug via INTRAVENOUS

## 2021-11-01 MED ORDER — THROMBIN 20000 UNITS EX SOLR
CUTANEOUS | Status: DC | PRN
  Administered 2021-11-01: 20000 [IU] via TOPICAL

## 2021-11-01 MED ORDER — SODIUM CHLORIDE 0.9 % IV SOLN: INTRAVENOUS | Status: DC

## 2021-11-01 MED ORDER — PANTOPRAZOLE SODIUM 40 MG PO TBEC
40.0000 mg | DELAYED_RELEASE_TABLET | Freq: Every day | ORAL | Status: DC
  Filled 2021-11-01: qty 1

## 2021-11-01 MED ORDER — ACETAMINOPHEN 160 MG/5ML PO SOLN: 1000.0000 mg | Freq: Four times a day (QID) | ORAL | Status: DC

## 2021-11-01 MED ORDER — SODIUM CHLORIDE 0.9% FLUSH
3.0000 mL | Freq: Two times a day (BID) | INTRAVENOUS | Status: DC
  Administered 2021-11-02 – 2021-11-18 (×27): 3 mL via INTRAVENOUS

## 2021-11-01 MED ORDER — MIDAZOLAM HCL (PF) 10 MG/2ML IJ SOLN
INTRAMUSCULAR | Status: AC
  Filled 2021-11-01: qty 2

## 2021-11-01 MED ORDER — CHLORHEXIDINE GLUCONATE CLOTH 2 % EX PADS
6.0000 | MEDICATED_PAD | Freq: Every day | CUTANEOUS | Status: DC
  Administered 2021-11-02 – 2021-11-13 (×13): 6 via TOPICAL

## 2021-11-01 MED ORDER — ACETAMINOPHEN 160 MG/5ML PO SOLN: 650.0000 mg | Freq: Once | ORAL | Status: AC

## 2021-11-01 MED ORDER — TRAMADOL HCL 50 MG PO TABS
50.0000 mg | ORAL_TABLET | ORAL | Status: DC | PRN
  Administered 2021-11-02 – 2021-11-12 (×3): 100 mg via ORAL
  Filled 2021-11-01 (×3): qty 2

## 2021-11-01 MED ORDER — SODIUM CHLORIDE 0.9% FLUSH: 3.0000 mL | INTRAVENOUS | Status: DC | PRN

## 2021-11-01 MED ORDER — PROTAMINE SULFATE 10 MG/ML IV SOLN
INTRAVENOUS | Status: DC | PRN
  Administered 2021-11-01: 300 mg via INTRAVENOUS

## 2021-11-01 MED ORDER — ETOMIDATE 2 MG/ML IV SOLN
INTRAVENOUS | Status: AC
  Filled 2021-11-01: qty 10

## 2021-11-01 MED ORDER — PROTAMINE SULFATE 10 MG/ML IV SOLN
INTRAVENOUS | Status: AC
  Filled 2021-11-01: qty 5

## 2021-11-01 MED ORDER — ALBUMIN HUMAN 5 % IV SOLN
250.0000 mL | INTRAVENOUS | Status: AC | PRN
  Administered 2021-11-01 (×4): 12.5 g via INTRAVENOUS
  Filled 2021-11-01: qty 250

## 2021-11-01 MED ORDER — FAMOTIDINE IN NACL 20-0.9 MG/50ML-% IV SOLN
20.0000 mg | Freq: Two times a day (BID) | INTRAVENOUS | Status: AC
  Administered 2021-11-01 (×2): 20 mg via INTRAVENOUS
  Filled 2021-11-01 (×2): qty 50

## 2021-11-01 MED ORDER — CEFAZOLIN SODIUM-DEXTROSE 2-4 GM/100ML-% IV SOLN
2.0000 g | Freq: Three times a day (TID) | INTRAVENOUS | Status: AC
  Administered 2021-11-01 – 2021-11-03 (×6): 2 g via INTRAVENOUS
  Filled 2021-11-01 (×6): qty 100

## 2021-11-01 MED ORDER — BISACODYL 5 MG PO TBEC
10.0000 mg | DELAYED_RELEASE_TABLET | Freq: Every day | ORAL | Status: DC
  Administered 2021-11-02 – 2021-11-14 (×8): 10 mg via ORAL
  Filled 2021-11-01 (×14): qty 2

## 2021-11-01 MED ORDER — MIDAZOLAM HCL (PF) 5 MG/ML IJ SOLN
INTRAMUSCULAR | Status: DC | PRN
  Administered 2021-11-01 (×4): 1 mg via INTRAVENOUS

## 2021-11-01 MED ORDER — PROPOFOL 10 MG/ML IV BOLUS
INTRAVENOUS | Status: DC | PRN
  Administered 2021-11-01: 30 mg via INTRAVENOUS
  Administered 2021-11-01: 20 mg via INTRAVENOUS
  Administered 2021-11-01: 40 mg via INTRAVENOUS

## 2021-11-01 MED ORDER — ASPIRIN 81 MG PO CHEW: 324.0000 mg | CHEWABLE_TABLET | Freq: Every day | ORAL | Status: DC

## 2021-11-01 MED ORDER — BISACODYL 10 MG RE SUPP
10.0000 mg | Freq: Every day | RECTAL | Status: DC
  Filled 2021-11-01 (×2): qty 1

## 2021-11-01 MED ORDER — METOPROLOL TARTRATE 5 MG/5ML IV SOLN: 2.5000 mg | INTRAVENOUS | Status: DC | PRN

## 2021-11-01 MED ORDER — HEPARIN SODIUM (PORCINE) 1000 UNIT/ML IJ SOLN
INTRAMUSCULAR | Status: AC
  Filled 2021-11-01: qty 1

## 2021-11-01 MED ORDER — HEMOSTATIC AGENTS (NO CHARGE) OPTIME
TOPICAL | Status: DC | PRN
Start: 2021-11-01 — End: 2021-11-01
  Administered 2021-11-01: 1 via TOPICAL

## 2021-11-01 MED ORDER — POTASSIUM CHLORIDE 10 MEQ/50ML IV SOLN
10.0000 meq | INTRAVENOUS | Status: AC
  Administered 2021-11-01 (×2): 10 meq via INTRAVENOUS

## 2021-11-01 MED ORDER — ONDANSETRON HCL 4 MG/2ML IJ SOLN
4.0000 mg | Freq: Four times a day (QID) | INTRAMUSCULAR | Status: DC | PRN
  Administered 2021-11-02 – 2021-11-13 (×10): 4 mg via INTRAVENOUS
  Filled 2021-11-01 (×10): qty 2

## 2021-11-01 MED ORDER — LIDOCAINE 2% (20 MG/ML) 5 ML SYRINGE
INTRAMUSCULAR | Status: AC
  Filled 2021-11-01: qty 5

## 2021-11-01 MED ORDER — HEPARIN SODIUM (PORCINE) 1000 UNIT/ML IJ SOLN
INTRAMUSCULAR | Status: DC | PRN
Start: 2021-11-01 — End: 2021-11-01
  Administered 2021-11-01: 33000 [IU] via INTRAVENOUS

## 2021-11-01 MED ORDER — MILRINONE LACTATE IN DEXTROSE 20-5 MG/100ML-% IV SOLN
0.1250 ug/kg/min | INTRAVENOUS | Status: DC
  Administered 2021-11-01 – 2021-11-06 (×9): 0.25 ug/kg/min via INTRAVENOUS
  Administered 2021-11-07 – 2021-11-12 (×6): 0.125 ug/kg/min via INTRAVENOUS
  Filled 2021-11-01 (×16): qty 100

## 2021-11-01 MED ORDER — NITROGLYCERIN IN D5W 200-5 MCG/ML-% IV SOLN: 0.0000 ug/min | INTRAVENOUS | Status: DC

## 2021-11-01 MED ORDER — EPINEPHRINE HCL 5 MG/250ML IV SOLN IN NS
0.0000 ug/min | INTRAVENOUS | Status: DC
  Administered 2021-11-02 – 2021-11-05 (×6): 5 ug/min via INTRAVENOUS
  Administered 2021-11-06: 3 ug/min via INTRAVENOUS
  Filled 2021-11-01 (×7): qty 250

## 2021-11-01 MED ORDER — DEXMEDETOMIDINE HCL IN NACL 400 MCG/100ML IV SOLN
0.0000 ug/kg/h | INTRAVENOUS | Status: DC
  Administered 2021-11-01: 0.5 ug/kg/h via INTRAVENOUS
  Filled 2021-11-01: qty 100

## 2021-11-01 MED ORDER — ROCURONIUM BROMIDE 10 MG/ML (PF) SYRINGE
PREFILLED_SYRINGE | INTRAVENOUS | Status: AC
  Filled 2021-11-01: qty 20

## 2021-11-01 MED ORDER — CHLORHEXIDINE GLUCONATE 0.12 % MT SOLN: 15.0000 mL | OROMUCOSAL | Status: AC

## 2021-11-01 MED ORDER — PROTAMINE SULFATE 10 MG/ML IV SOLN
INTRAVENOUS | Status: AC
  Filled 2021-11-01: qty 25

## 2021-11-01 MED ORDER — ESCITALOPRAM OXALATE 10 MG PO TABS
10.0000 mg | ORAL_TABLET | Freq: Every day | ORAL | Status: DC
  Administered 2021-11-02 – 2021-11-18 (×17): 10 mg via ORAL
  Filled 2021-11-01 (×17): qty 1

## 2021-11-01 SURGICAL SUPPLY — 139 items
ADAPTER CARDIO PERF ANTE/RETRO (ADAPTER) ×2 IMPLANT
ADH SKN CLS APL DERMABOND .7 (GAUZE/BANDAGES/DRESSINGS) ×1
ADPR PRFSN 84XANTGRD RTRGD (ADAPTER) ×1
ARTICLIP LAA PROCLIP II 45 (Clip) ×2 IMPLANT
BAG DECANTER FOR FLEXI CONT (MISCELLANEOUS) ×2 IMPLANT
BLADE CLIPPER SURG (BLADE) ×2 IMPLANT
BLADE STERNUM SYSTEM 6 (BLADE) ×2 IMPLANT
BLADE SURG 15 STRL LF DISP TIS (BLADE) ×1 IMPLANT
BLADE SURG 15 STRL SS (BLADE) ×2
BNDG ELASTIC 4X5.8 VLCR STR LF (GAUZE/BANDAGES/DRESSINGS) ×2 IMPLANT
BNDG ELASTIC 6X5.8 VLCR STR LF (GAUZE/BANDAGES/DRESSINGS) ×2 IMPLANT
BNDG GAUZE ELAST 4 BULKY (GAUZE/BANDAGES/DRESSINGS) ×2 IMPLANT
CANISTER SUCT 3000ML PPV (MISCELLANEOUS) ×2 IMPLANT
CANN PRFSN 3/8XCNCT ST RT ANG (MISCELLANEOUS) ×1
CANNULA GUNDRY RCSP 15FR (MISCELLANEOUS) ×2 IMPLANT
CANNULA PRFSN 3/8XCNCT RT ANG (MISCELLANEOUS) IMPLANT
CANNULA SUMP PERICARDIAL (CANNULA) ×2 IMPLANT
CANNULA VEN MTL TIP RT (MISCELLANEOUS) ×2
CANNULA VRC MALB SNGL STG 36FR (MISCELLANEOUS) IMPLANT
CARDIOBLATE CARDIAC ABLATION (MISCELLANEOUS)
CATH ROBINSON RED A/P 18FR (CATHETERS) ×6 IMPLANT
CATH THORACIC 28FR (CATHETERS) ×3 IMPLANT
CATH THORACIC 36FR (CATHETERS) ×2 IMPLANT
CATH THORACIC 36FR RT ANG (CATHETERS) ×2 IMPLANT
CLAMP ISOLATOR SYNERGY LG (MISCELLANEOUS) ×1 IMPLANT
CLIP TI WIDE RED SMALL 24 (CLIP) ×1 IMPLANT
CLIP VESOCCLUDE MED 24/CT (CLIP) IMPLANT
CLIP VESOCCLUDE SM WIDE 24/CT (CLIP) IMPLANT
CONN 1/2X1/2X1/2  BEN (MISCELLANEOUS) ×1
CONN 1/2X1/2X1/2 BEN (MISCELLANEOUS) ×1 IMPLANT
CONN 3/8X1/2 ST GISH (MISCELLANEOUS) ×4 IMPLANT
CONN 3/8X3/8 GISH STERILE (MISCELLANEOUS) ×1 IMPLANT
CONN ST 3/8 X 1/2 (MISCELLANEOUS) ×2 IMPLANT
CONTAINER PROTECT SURGISLUSH (MISCELLANEOUS) ×4 IMPLANT
COVER SURGICAL LIGHT HANDLE (MISCELLANEOUS) ×4 IMPLANT
DERMABOND ADVANCED (GAUZE/BANDAGES/DRESSINGS) ×1
DERMABOND ADVANCED .7 DNX12 (GAUZE/BANDAGES/DRESSINGS) IMPLANT
DEVICE ATRICLIP LAA PRCLPII 45 (Clip) IMPLANT
DEVICE CARDIOBLATE CARDIAC ABL (MISCELLANEOUS) IMPLANT
DEVICE SUT CK QUICK LOAD MINI (Prosthesis & Implant Heart) ×1 IMPLANT
DRAPE CARDIOVASCULAR INCISE (DRAPES) ×2
DRAPE SRG 135X102X78XABS (DRAPES) ×1 IMPLANT
DRAPE WARM FLUID 44X44 (DRAPES) ×2 IMPLANT
DRSG COVADERM 4X14 (GAUZE/BANDAGES/DRESSINGS) ×2 IMPLANT
ELECT CAUTERY BLADE 6.4 (BLADE) ×2 IMPLANT
ELECT REM PT RETURN 9FT ADLT (ELECTROSURGICAL) ×4
ELECTRODE REM PT RTRN 9FT ADLT (ELECTROSURGICAL) ×2 IMPLANT
FELT TEFLON 1X6 (MISCELLANEOUS) ×4 IMPLANT
GAUZE 4X4 16PLY ~~LOC~~+RFID DBL (SPONGE) ×2 IMPLANT
GAUZE SPONGE 4X4 12PLY STRL (GAUZE/BANDAGES/DRESSINGS) ×4 IMPLANT
GAUZE SPONGE 4X4 12PLY STRL LF (GAUZE/BANDAGES/DRESSINGS) ×2 IMPLANT
GLOVE BIO SURGEON STRL SZ 6 (GLOVE) IMPLANT
GLOVE BIO SURGEON STRL SZ 6.5 (GLOVE) IMPLANT
GLOVE BIO SURGEON STRL SZ7 (GLOVE) IMPLANT
GLOVE BIO SURGEON STRL SZ7.5 (GLOVE) IMPLANT
GLOVE BIOGEL PI IND STRL 6 (GLOVE) IMPLANT
GLOVE BIOGEL PI IND STRL 6.5 (GLOVE) IMPLANT
GLOVE BIOGEL PI IND STRL 7.0 (GLOVE) IMPLANT
GLOVE BIOGEL PI INDICATOR 6 (GLOVE)
GLOVE BIOGEL PI INDICATOR 6.5 (GLOVE)
GLOVE BIOGEL PI INDICATOR 7.0 (GLOVE)
GLOVE ORTHO TXT STRL SZ7.5 (GLOVE) IMPLANT
GLOVE SURG MICRO LTX SZ7 (GLOVE) ×4 IMPLANT
GOWN STRL REUS W/ TWL LRG LVL3 (GOWN DISPOSABLE) ×4 IMPLANT
GOWN STRL REUS W/ TWL XL LVL3 (GOWN DISPOSABLE) ×1 IMPLANT
GOWN STRL REUS W/TWL LRG LVL3 (GOWN DISPOSABLE) ×8
GOWN STRL REUS W/TWL XL LVL3 (GOWN DISPOSABLE) ×2
HEMOSTAT POWDER SURGIFOAM 1G (HEMOSTASIS) ×6 IMPLANT
HEMOSTAT SURGICEL 2X14 (HEMOSTASIS) ×2 IMPLANT
INSERT FOGARTY 61MM (MISCELLANEOUS) IMPLANT
INSERT FOGARTY XLG (MISCELLANEOUS) ×1 IMPLANT
KIT BASIN OR (CUSTOM PROCEDURE TRAY) ×2 IMPLANT
KIT CATH CPB BARTLE (MISCELLANEOUS) ×2 IMPLANT
KIT SUCTION CATH 14FR (SUCTIONS) ×2 IMPLANT
KIT SUT CK MINI COMBO 4X17 (Prosthesis & Implant Heart) ×1 IMPLANT
KIT TURNOVER KIT B (KITS) ×2 IMPLANT
KIT VASOVIEW HEMOPRO 2 VH 4000 (KITS) ×2 IMPLANT
LINE VENT (MISCELLANEOUS) ×1 IMPLANT
LOOP VESSEL SUPERMAXI WHITE (MISCELLANEOUS) ×2 IMPLANT
NDL SUT 4 .5 CRC FRENCH EYE (NEEDLE) IMPLANT
NEEDLE FRENCH EYE (NEEDLE) ×2
NS IRRIG 1000ML POUR BTL (IV SOLUTION) ×10 IMPLANT
PACK E OPEN HEART (SUTURE) ×2 IMPLANT
PACK OPEN HEART (CUSTOM PROCEDURE TRAY) ×2 IMPLANT
PAD ARMBOARD 7.5X6 YLW CONV (MISCELLANEOUS) ×4 IMPLANT
PAD ELECT DEFIB RADIOL ZOLL (MISCELLANEOUS) ×2 IMPLANT
PENCIL BUTTON HOLSTER BLD 10FT (ELECTRODE) ×2 IMPLANT
POSITIONER HEAD DONUT 9IN (MISCELLANEOUS) ×2 IMPLANT
PROBE CRYO2-ABLATION MALLABLE (MISCELLANEOUS) ×1 IMPLANT
PUNCH AORTIC ROTATE 4.0MM (MISCELLANEOUS) ×1 IMPLANT
PUNCH AORTIC ROTATE 4.5MM 8IN (MISCELLANEOUS) ×3 IMPLANT
PUNCH AORTIC ROTATE 5MM 8IN (MISCELLANEOUS) IMPLANT
SET MPS 3-ND DEL (MISCELLANEOUS) ×1 IMPLANT
SPONGE INTESTINAL PEANUT (DISPOSABLE) IMPLANT
SPONGE T-LAP 18X18 ~~LOC~~+RFID (SPONGE) ×10 IMPLANT
SPONGE T-LAP 4X18 ~~LOC~~+RFID (SPONGE) ×4 IMPLANT
SUPPORT HEART JANKE-BARRON (MISCELLANEOUS) ×2 IMPLANT
SUT BONE WAX W31G (SUTURE) ×2 IMPLANT
SUT ETHIBOND 2 0 SH (SUTURE) ×2 IMPLANT
SUT ETHIBOND 2 0 SH 36X2 (SUTURE) ×1 IMPLANT
SUT ETHIBOND 2 0 V4 (SUTURE) IMPLANT
SUT ETHIBOND 2 0 V5 (SUTURE) ×2 IMPLANT
SUT ETHIBOND 2 0V4 GREEN (SUTURE) IMPLANT
SUT MNCRL AB 4-0 PS2 18 (SUTURE) IMPLANT
SUT PROLENE 3 0 SH 48 (SUTURE) ×8 IMPLANT
SUT PROLENE 3 0 SH DA (SUTURE) IMPLANT
SUT PROLENE 3 0 SH1 36 (SUTURE) ×2 IMPLANT
SUT PROLENE 4 0 RB 1 (SUTURE) ×10
SUT PROLENE 4 0 SH DA (SUTURE) IMPLANT
SUT PROLENE 4-0 RB1 .5 CRCL 36 (SUTURE) ×2 IMPLANT
SUT PROLENE 5 0 C 1 36 (SUTURE) IMPLANT
SUT PROLENE 6 0 C 1 30 (SUTURE) IMPLANT
SUT PROLENE 7 0 BV 1 (SUTURE) IMPLANT
SUT PROLENE 7 0 BV1 MDA (SUTURE) ×2 IMPLANT
SUT PROLENE 8 0 BV175 6 (SUTURE) IMPLANT
SUT SILK  1 MH (SUTURE) ×2
SUT SILK 1 MH (SUTURE) IMPLANT
SUT SILK 2 0 SH (SUTURE) IMPLANT
SUT SILK 2 0 SH CR/8 (SUTURE) ×1 IMPLANT
SUT STEEL STERNAL CCS#1 18IN (SUTURE) ×1 IMPLANT
SUT STEEL SZ 6 DBL 3X14 BALL (SUTURE) ×2 IMPLANT
SUT VIC AB 1 CTX 36 (SUTURE) ×4
SUT VIC AB 1 CTX36XBRD ANBCTR (SUTURE) ×2 IMPLANT
SUT VIC AB 2-0 CT1 27 (SUTURE)
SUT VIC AB 2-0 CT1 TAPERPNT 27 (SUTURE) IMPLANT
SUT VIC AB 2-0 CTX 27 (SUTURE) IMPLANT
SUT VIC AB 3-0 SH 27 (SUTURE)
SUT VIC AB 3-0 SH 27X BRD (SUTURE) IMPLANT
SUT VIC AB 3-0 X1 27 (SUTURE) IMPLANT
SUT VICRYL 4-0 PS2 18IN ABS (SUTURE) IMPLANT
SYSTEM SAHARA CHEST DRAIN ATS (WOUND CARE) ×3 IMPLANT
TOWEL GREEN STERILE (TOWEL DISPOSABLE) ×2 IMPLANT
TOWEL GREEN STERILE FF (TOWEL DISPOSABLE) ×2 IMPLANT
TRAY FOLEY SLVR 16FR TEMP STAT (SET/KITS/TRAYS/PACK) ×2 IMPLANT
TUBING LAP HI FLOW INSUFFLATIO (TUBING) ×2 IMPLANT
UNDERPAD 30X36 HEAVY ABSORB (UNDERPADS AND DIAPERS) ×2 IMPLANT
VALVE MITRAL MITRIS RESILIA 27 (Valve) ×1 IMPLANT
VRC MALLEABLE SINGLE STG 36FR (MISCELLANEOUS) ×2
WATER STERILE IRR 1000ML POUR (IV SOLUTION) ×4 IMPLANT

## 2021-11-01 NOTE — Anesthesia Procedure Notes (Signed)
Arterial Line Insertion Start/End5/26/2023 6:55 AM, 11/01/2021 7:00 AM Performed by: CRNA  Patient location: Pre-op. Preanesthetic checklist: patient identified, IV checked, site marked, risks and benefits discussed, surgical consent, monitors and equipment checked, pre-op evaluation, timeout performed and anesthesia consent Lidocaine 1% used for infiltration Left, radial was placed Catheter size: 20 G Hand hygiene performed  and maximum sterile barriers used   Attempts: 1 Procedure performed without using ultrasound guided technique. Following insertion, dressing applied and Biopatch. Post procedure assessment: normal and unchanged

## 2021-11-01 NOTE — Anesthesia Procedure Notes (Signed)
Central Venous Catheter Insertion Performed by: Oleta Mouse, MD, anesthesiologist Start/End5/26/2023 6:56 AM, 11/01/2021 7:11 AM Patient location: Pre-op. Preanesthetic checklist: patient identified, IV checked, site marked, risks and benefits discussed, surgical consent, monitors and equipment checked, pre-op evaluation, timeout performed and anesthesia consent Lidocaine 1% used for infiltration and patient sedated Hand hygiene performed  and maximum sterile barriers used  Catheter size: 9 Fr Total catheter length 10. MAC introducer Procedure performed using ultrasound guided technique. Ultrasound Notes:anatomy identified, needle tip was noted to be adjacent to the nerve/plexus identified, no ultrasound evidence of intravascular and/or intraneural injection and image(s) printed for medical record Attempts: 1 Following insertion, line sutured, dressing applied and Biopatch. Post procedure assessment: blood return through all ports, free fluid flow and no air  Patient tolerated the procedure well with no immediate complications.

## 2021-11-01 NOTE — Op Note (Signed)
CARDIOVASCULAR SURGERY OPERATIVE NOTE  11/01/2021  Surgeon:  Gaye Pollack, MD  First Assistant: Enid Cutter,  PA-C:   An experienced assistant was required given the complexity of this surgery and the standard of surgical care. The assistant was needed for endoscopic vein harvesting, exposure, dissection, suctioning, retraction of delicate tissues and sutures, instrument exchange and for overall help during this procedure.   Preoperative Diagnosis:  Severe multi-vessel coronary artery disease   Postoperative Diagnosis:  Same   Procedure:  Median Sternotomy Extracorporeal circulation 3.   Coronary artery bypass grafting x 2  Left internal mammary artery graft to the LAD SVG to diagonal   4.   Endoscopic vein harvest from the right leg 5.   Bi-atrial MAZE procedure with clipping of left atrial appendage. 6.   Chordal-sparing mitral valve replacement using a 27 mm Edwards MITRIS pericardial valve.    Anesthesia:  General Endotracheal   Clinical History/Surgical Indication:  This 76 year old gentleman has severe multivessel coronary disease with a late presenting inferior STEMI with ejection fraction of 25 to 30% by echocardiogram, moderate to severe ischemic mitral regurgitation, and moderate RV systolic dysfunction.  TEE showed the  EF is 25-30% with inferoposterior akinesis. There is moderate to severe ischemic MR from posterior leaflet restriction with pulm vein flow reversal. The aortic valve is thickened with some calcification and immobility of left coronary leaflet, mild to moderate AI. The mean gradient across the aortic valve on 2D echo was only 6 mm Hg but the SVI was only 19. Dr. Burt Knack does not feel that LAD is ideal for PCI due to length of disease and large septal arising at the stenosed area. Therefore CABG and MV replacement is probably the best treatment option. Aortic  valve can be followed and treated with TAVR later if it worsens. Dr. Haroldine Laws and I felt that the best option would be for him to go home and recover for a few weeks and then come back for outpt surgery. Unfortunately he  returned with worsening CHF and angina with wt gain, bilateral pleural effusions and proBNP of 6000. PICC line was inserted and Co-ox was 58%. He has been started on milrinone 0.25 and diuresed with Co-ox increasing 64-70%. He has had some anginal chest pain since admission and was started on heparin and NTG. He has also had some atrial fibrillation and converted to sinus on amiodarone. After discussion with Dr. Haroldine Laws we felt that he was ready for surgery. I discussed the operative procedure with the patient and family including alternatives, benefits and risks; including but not limited to bleeding, blood transfusion, infection, stroke, myocardial infarction, graft failure, heart block requiring a permanent pacemaker, organ dysfunction, and death.  Mechele Collin understands and agrees to proceed.     Preparation:  The patient was seen in the preoperative holding area and the correct patient, correct operation were confirmed with the patient after reviewing the medical record and catheterization. The consent was signed by me. Preoperative antibiotics were given. A pulmonary arterial line and radial arterial line were placed by the anesthesia team. The patient was taken back to the operating room and positioned supine on the operating room table. After being placed under general endotracheal anesthesia by the anesthesia team a foley catheter was placed. The neck, chest, abdomen, and both legs were prepped with betadine soap and solution and draped in the usual sterile manner. A surgical time-out was taken and the correct patient and operative procedure were confirmed with the nursing and anesthesia staff.  TEE: performed by Dr. Corky Sox. This showed severe LV systolic dysfunction with  inferior, posterior and lateral akinesis. There was severe MR that appeared to be due to posterior leaflet restriction from ischemic infarct of the posterolateral wall. There was moderate AS with a planimetered valve area of 2.1 cm2 and mild AI.  Cardiopulmonary Bypass:  A median sternotomy was performed. The pericardium was opened in the midline. He had acute pericarditis with the pericardial space obliterated by inflammatory adhesions. These could be separated easily with my hand. The surface of the heart and pericardium were acutely inflamed. Right ventricular function appeared mildly hypokinetic. The ascending aorta was of normal size and had no palpable plaque. There were no contraindications to aortic cannulation or cross-clamping. The patient was fully systemically heparinized and the ACT was maintained > 400 sec. The proximal aortic arch was cannulated with a 20 F aortic cannula for arterial inflow. Bi-caval venous cannulation was performed via the SVC using a 28 F metal tip right angle cannula and the IVC using a 36 F. Malleable plastic cannula. An antegrade cardioplegia/vent cannula was inserted into the mid-ascending aorta. A retrograde cardioplegia cannula was placed in the coronary sinus. Aortic occlusion was performed with a single cross-clamp. Systemic cooling to 32 degrees Centigrade and topical cooling of the heart with iced saline were used. Cold antegrade and retrograde KBC cardioplegia was used to induce diastolic arrest and was then given retrograde at about 60 minute intervals throughout the period of arrest to maintain myocardial temperature at or below 10 degrees centigrade. A temperature probe was inserted into the interventricular septum and an insulating pad was placed in the pericardium. CO2 was insufflated in the pericardium to minimize intracardiac air.    Left internal mammary artery harvest:  The left side of the sternum was retracted using the Rultract retractor. The left  internal mammary artery was harvested as a pedicle graft. All side branches were clipped. It was a medium-sized vessel of good quality with excellent blood flow. It was ligated distally and divided. It was sprayed with topical papaverine solution to prevent vasospasm.   Endoscopic vein harvest:  The right greater saphenous vein was harvested endoscopically through a 2 cm incision medial to the right knee. It was harvested from the upper thigh to below the knee. It was a medium-sized vein of good quality. The side branches were all ligated with 4-0 silk ties.    Coronary arteries:  The coronary arteries were examined.  LAD:  medium caliber vessel with no distal disease. The diagonal was a medium caliber vessel that was long and was occluded on cath but looked suitable for grafting.  LCX:  No stenosis on cath RCA:  Occluded.   Grafts:  LIMA to the LAD: 2.0 mm. It was sewn end to side using 8-0 prolene continuous suture. SVG to diagonal:  1.6 mm. It was sewn end to side using 7-0 prolene continuous suture.  The proximal vein graft anastomosis was performed to the mid-ascending aorta using continuous 6-0 prolene suture. A graft marker was placed around the proximal anastomosis.  Maze    The left pulmonary veins were circled with a tape. An Atricure bipolar RF clamp was used to create a lesion around the pulmonary veins on the left atrial wall. Two activations of RF energy were applied with 3 clampings. A cryo lesion was applied from the base of the LAA to the left pulmonary vein lesion. All cryo lesions were for 2 minutes. The base of the LAA  appendage was measured and a 45 mm Atricure Pro 2 clip was applied to the base of the appendage to isolate it. The left atrium was opened by a vertical incision in the interatrial groove. An encircling lesion was then created around the right pulmonary veins using the atriotomy incision anteriorly and a cryo lesion posteriorly. Then cryo lesions were  created to join the pulmonary vein lesions superiorly and inferiorly creating a posterior box.  A cryo lesion was placed between the inferior lesion that joined the pulmonary veins and the posterior mitral annulus at P2-3. A lesion was placed externally across the coronary sinus overlapping this. The mitral valve replacement was performed and then the left atrium was closed with two layers of continuous 3-0 prolene suture. The right atrium was opened obliquely  An RF lesion was created to join the SVC and IVC posteriorly along the interatrial septum. These internal/external lesions were two firings and two applications for each lesion.  A cryo lesion was placed from the anterior aspect of this oblique atriotomy down to the tricuspid annulus. An RF lesion was placed obliquely up to the tip of the RAA. The right atriotomy was then closed with two layers of 4-0 prolene suture.    Mitral Valve Replacement:   The left atrium was opened through a vertical incision in the interatrial groove. Exposure was good. Valve inspection showed thickened leaflets. The anterior leaflet was split in the middle. A paddle of the leaflet was left attached to the anterolateral papillary muscle and posteromedial papillary muscle and sutured to the mitral annulus anterior to the commissures using the valve sutures.   A series of pledgetted 2-0 Ethibond sutures were placed around the mitral annulus. A 27 mm Edwards MITRIS pericardial valve was chosen ( model 11400M, SN [id number]). The sutures were placed through the sewing ring and it was lowered into place. The sutures were tied using CorKnots. The atrium was closed with 2 layers of continuous 3-0 prolene suture.   Completion:  The patient was rewarmed to 37 degrees Centigrade. The clamp was removed from the LIMA pedicle and there was rapid warming of the septum. The crossclamp was removed with a time of 198 minutes. There was spontaneous return of sinus rhythm. The distal and proximal  anastomoses were checked for hemostasis. The position of the grafts was satisfactory. Two temporary epicardial pacing wires were placed on the right atrium and two on the right ventricle. The patient was weaned from CPB without difficulty on no milrinone 0.25 and Epi 5.  CPB time was 223 minutes. Cardiac output was 5 LPM. TEE showed a normally functioning mitral valve prosthesis with no regurgitation or paravalvular leak. LVEF overall was unchanged but the anterior wall and RV appeared to be moving better. Mild AI persisted. Heparin was fully reversed with protamine and the aortic and venous cannulas removed. Hemostasis was achieved. He was given 1 unit of plts and 2 units of FFP for coagulopathy. Mediastinal and bilateral pleural drainage tubes were placed. The sternum was closed with double #6 stainless steel wires. The fascia was closed with continuous # 1 vicryl suture. The subcutaneous tissue was closed with 2-0 vicryl continuous suture. The skin was closed with 3-0 vicryl subcuticular suture. All sponge, needle, and instrument counts were reported correct at the end of the case. Dry sterile dressings were placed over the incisions and around the chest tubes which were connected to pleurevac suction. The patient was then transported to the surgical intensive care unit in stable condition.

## 2021-11-01 NOTE — Anesthesia Postprocedure Evaluation (Signed)
Anesthesia Post Note  Patient: Keith Jenkins  Procedure(s) Performed: MITRAL VALVE (MV) REPLACEMENT USING 27MM MITRIS VALVE. (Chest) CORONARY ARTERY BYPASS GRAFTING (CABG) X TWO USING OPEN LEFT INTERNAL MAMMARY ARTERY AND ENDOSOPIC RIGHT GREATER SAPHENOUS VEIN HARVEST. (Chest) MAZE (Chest) TRANSESOPHAGEAL ECHOCARDIOGRAM (TEE) (Chest) CLIPPING OF ATRIAL APPENDAGE USING AN ATRICLIP PRO2 45MM (Chest)     Patient location during evaluation: SICU Anesthesia Type: General Level of consciousness: sedated Pain management: pain level controlled Vital Signs Assessment: post-procedure vital signs reviewed and stable Respiratory status: patient remains intubated per anesthesia plan Cardiovascular status: stable Postop Assessment: no apparent nausea or vomiting Anesthetic complications: no   No notable events documented.  Last Vitals:  Vitals:   11/01/21 0726 11/01/21 1520  BP:  (!) 107/53  Pulse: 78 82  Resp: 16 12  Temp:    SpO2: 97% 95%    Last Pain:  Vitals:   11/01/21 0400  TempSrc: Oral  PainSc:                  Ho Parisi

## 2021-11-01 NOTE — Progress Notes (Cosign Needed Addendum)
Advanced Heart Failure Rounding Note  PCP-Cardiologist: None   Subjective:    05/19: Admit with a/c CHF. CO-OX okay but appeared low-output >>started milrinone 0.25 05/22: Developed A flutter. Started on amio drip. Diuresd with IV lasix + metolazone 05/23: Chest pain. HS Trop 422>395. Started on imdur.  05/26: S/p CABG x2 (LIMA-LAD, SVG-RAMUS INT) +MVR, MAZE, LAA Clip   acute pericarditis/ adhensions noted at time of CABG  Just returned from OR  Intubated and sedated   Epi 5, Neo 60, Milrinone 0.25   Getting Albumin   Swan #s PAP 23/15 (21) CO 3.48 CI 1.6 CVP 9 Co-ox pending   MAP 68    Objective:   Weight Range: 93.2 kg Body mass index is 27.11 kg/m.   Vital Signs:   Temp:  [97.9 F (36.6 C)-98.6 F (37 C)] 97.9 F (36.6 C) (05/26 0400) Pulse Rate:  [72-84] 82 (05/26 1520) Resp:  [12-25] 12 (05/26 1520) BP: (92-107)/(53-73) 107/53 (05/26 1520) SpO2:  [90 %-100 %] 95 % (05/26 1520) FiO2 (%):  [50 %] 50 % (05/26 1520) Weight:  [93.2 kg] 93.2 kg (05/26 0400) Last BM Date : 10/29/21  Weight change: Filed Weights   10/30/21 0528 10/31/21 0222 11/01/21 0400  Weight: 92.6 kg 93.1 kg 93.2 kg    Intake/Output:   Intake/Output Summary (Last 24 hours) at 11/01/2021 1551 Last data filed at 11/01/2021 1512 Gross per 24 hour  Intake 4281.46 ml  Output 3419 ml  Net 862.46 ml      Physical Exam   General:  intubated and sedated, pale  HEENT: normal + ETT Neck: supple. JVD elevated. Carotids 2+ bilat; no bruits. No lymphadenopathy or thyromegaly appreciated. Cor: PMI nondisplaced. Regular rate & rhythm. + sternal dressing + CTs  Lungs: intubated and clear Abdomen: soft, nontender, nondistended. No hepatosplenomegaly. No bruits or masses. Good bowel sounds. Extremities: no cyanosis, clubbing, rash, edema Neuro: intubated and sedated    Telemetry   A-V paced 80s, personally reviewed   EKG    N/A Labs    CBC Recent Labs    10/31/21 0500  11/01/21 0430 11/01/21 0806 11/01/21 1305 11/01/21 1328 11/01/21 1417 11/01/21 1420  WBC 9.3 8.6  --   --   --   --   --   HGB 12.0* 11.9*   < > 9.2*   < > 8.8* 9.2*  HCT 33.8* 32.8*   < > 25.6*   < > 26.0* 27.0*  MCV 84.3 83.7  --   --   --   --   --   PLT 261 239  --  138*  --   --   --    < > = values in this interval not displayed.   Basic Metabolic Panel Recent Labs    10/31/21 0500 11/01/21 0430 11/01/21 0806 11/01/21 1328 11/01/21 1417 11/01/21 1420  NA 126* 123*   < > 126* 128* 127*  K 4.3 4.3   < > 4.9 4.1 4.1  CL 87* 86*   < > 90*  --  91*  CO2 30 28  --   --   --   --   GLUCOSE 212* 149*   < > 122*  --  149*  BUN 38* 37*   < > 34*  --  31*  CREATININE 1.46* 1.44*   < > 1.20  --  1.10  CALCIUM 8.5* 8.4*  --   --   --   --    < > =  values in this interval not displayed.   Liver Function Tests No results for input(s): AST, ALT, ALKPHOS, BILITOT, PROT, ALBUMIN in the last 72 hours. No results for input(s): LIPASE, AMYLASE in the last 72 hours. Cardiac Enzymes No results for input(s): CKTOTAL, CKMB, CKMBINDEX, TROPONINI in the last 72 hours.  BNP: BNP (last 3 results) Recent Labs    10/10/21 1606 10/22/21 1623 10/24/21 2150  BNP 1,564.6* 1,226.7* 1,306.1*    ProBNP (last 3 results) No results for input(s): PROBNP in the last 8760 hours.   D-Dimer No results for input(s): DDIMER in the last 72 hours. Hemoglobin A1C Recent Labs    11/01/21 0430  HGBA1C 6.0*   Fasting Lipid Panel No results for input(s): CHOL, HDL, LDLCALC, TRIG, CHOLHDL, LDLDIRECT in the last 72 hours. Thyroid Function Tests No results for input(s): TSH, T4TOTAL, T3FREE, THYROIDAB in the last 72 hours.  Invalid input(s): FREET3  Other results:   Imaging    No results found.   Medications:     Scheduled Medications:  [START ON 11/02/2021] acetaminophen  1,000 mg Oral Q6H   Or   [START ON 11/02/2021] acetaminophen (TYLENOL) oral liquid 160 mg/5 mL  1,000 mg Per Tube  Q6H   acetaminophen (TYLENOL) oral liquid 160 mg/5 mL  650 mg Per Tube Once   Or   acetaminophen  650 mg Rectal Once   [START ON 11/02/2021] aspirin EC  325 mg Oral Daily   Or   [START ON 11/02/2021] aspirin  324 mg Per Tube Daily   [START ON 11/02/2021] atorvastatin  80 mg Oral Daily   [START ON 11/02/2021] bisacodyl  10 mg Oral Daily   Or   [START ON 11/02/2021] bisacodyl  10 mg Rectal Daily   chlorhexidine  15 mL Mouth/Throat NOW   [START ON 11/02/2021] docusate sodium  200 mg Oral Daily   [START ON 11/02/2021] escitalopram  10 mg Oral Daily   latanoprost  1 drop Both Eyes QHS   [START ON 11/03/2021] pantoprazole  40 mg Oral Daily   [START ON 11/02/2021] sodium chloride flush  3 mL Intravenous Q12H    Infusions:  sodium chloride     [START ON 11/02/2021] sodium chloride     sodium chloride     albumin human      ceFAZolin (ANCEF) IV     dexmedetomidine (PRECEDEX) IV infusion     epinephrine     famotidine (PEPCID) IV     insulin     lactated ringers     lactated ringers     lactated ringers     magnesium sulfate     milrinone     nitroGLYCERIN     phenylephrine (NEO-SYNEPHRINE) Adult infusion     potassium chloride     vancomycin      PRN Medications: sodium chloride, albumin human, dextrose, lactated ringers, midazolam, morphine injection, ondansetron (ZOFRAN) IV, oxyCODONE, [START ON 11/02/2021] sodium chloride flush, traMADol    Patient Profile   76 y.o. male with hx COPD, prediabetes, HLD, GERD. Recent admit earlier this month with later presentation inferior STEMI and new systolic CHF w/ moderate to severe ischemic MR.   Readmitted with a/c CHF. Optimized from HF standpoint. Underwent CABG w MVR, MAZE and LAA clip 5/26.   Assessment/Plan   1. CAD/recent late presentation inferior STEMI - LHC 05/04: 100% m RCA and 90% m LAD.  - RCA managed medically d/t completed infarct.  - Echo (5/23) EF 25%, RV moderately reduced, moderate to severe  MR - S/p CABG x 2 (LIMA-LAD,  SVG-Ramus INT) 5/26  - ASA + high intensity statin    2. Acute on chronic systolic CHF with low-output/ICM - In setting of late presentation inferior STEMI 05/23 - Echo 05/23 EF 25-30%, RV moderately reduced, moderate to severe MR. - Limited echo on admit EF 30-35%, RV moderately reduced, MR not well evaluated, moderate pericardial effusion - NYHA IIIb - CO-OX okay on admit but appeared low-output. Optimized w/ milrinone and diuresed w/ lasix prior to CABG - S/p CABG + MVR 5/26 - on Milrinone 0.25, Neo 60 + Epi 5. CI 1.28  - try to wean off Neo if possible. Prefer NE for BP/ added inotropic support  - follow swan hemodynamics and follow co-ox - CVP 9. No diuresis today.  - re-initiation of GDMT once stable off pressors/inotropes     3. Mitral regurgitation - Moderate to severe ischemic MR on TEE.  - S/p tissue MVR   4. Aortic valve insufficiency - Mild to moderate AI - Will need to follow.   5. Pericardial effusion/Acute Pericarditis  - Moderate in size on echo, no respiratory variation or RV collapse to suggest tamponade - acute pericarditis/ adhensions noted at time of CABG - c/w CTs   6. H/o AKI - Scr stable 1.0>1.16>1.33>1.4 >1.4>1.46>1.20>>1.10  - Watch renal function closely    7. Prediabetes - A1c 5.8 1/23 - holding Victoria for now   8. PAF - s/p MAZE w/ CABG  - continue amio gtt while on inotrope  - a/c once ok to resume per CT surgery   9. Hyponatremia  - Na 127 today  - follow closely  - Will need Tolvaptan if < or = 125.    Length of Stay: [address] [place]  11/01/2021, 3:51 PM  Advanced Heart Failure Team Pager [phone] (M-F; 7a - 5p)  Please contact [facility] for night-coverage after hours (5p -7a ) and weekends on [email or web address]  Agree with above.   Patient seen on return from OR. Remained intubated and sedated.   CVP and CI initially low and given 2 bottles of albumin. I shot CO myself and CI remained low at 1.5.   Will wean neo  and start NE for LV support. CTs look good. PA pressures ok. Rhythm is paced.   General:  Intubated sedated  HEENT: normal +ETT Neck: supple. RIJ swan Carotids 2+ bilat; no bruits. No lymphadenopathy or thryomegaly appreciated. Cor: Sternal dressing regular Lungs: coarse Abdomen: soft, nontender, nondistended. No hepatosplenomegaly. No bruits or masses. Hypoactive bowel sounds. Extremities: no cyanosis, clubbing, rash, edema  RLE wrapped Neuro: intubated sedated  He is immediately post-op. CI remains low. Change neo to NE as above. Follow outputs. Hopefully can wean vent soon. Discussed with his wife and daughter at bedside.   Given age, fraility and reduced LV function suspect may be slow inotrope wean.   CRITICAL CARE Performed by: Glori Bickers  Total critical care time: 35 minutes  Critical care time was exclusive of separately billable procedures and treating other patients.  Critical care was necessary to treat or prevent imminent or life-threatening deterioration.  Critical care was time spent personally by me (independent of midlevel providers or residents) on the following activities: development of treatment plan with patient and/or surrogate as well as nursing, discussions with consultants, evaluation of patient's response to treatment, examination of patient, obtaining history from patient or surrogate, ordering and performing treatments and interventions, ordering and review of laboratory studies, ordering and  review of radiographic studies, pulse oximetry and re-evaluation of patient's condition.  Glori Bickers, MD

## 2021-11-01 NOTE — Hospital Course (Addendum)
History of Present Illness:  The patient is a 76 year old male we have seen previously in consideration for CABG/mitral valve replacement or repair.  He was evaluated by Dr. Cyndia Bent and was to be scheduled however the patient presented last week with an inferior ST elevation myocardial infarction.  He developed chest pain/pressure with associated dyspnea approximately 5 days prior to presentation.  This hospitalization was from 10/10/2021 until 10/16/2021.  Echocardiogram (TTE) revealed an ejection fraction of 20 to 25%, RWMA, moderately reduced right ventricular function, moderate to severe ischemic MR, aortic root 4.3 cm, dilated IVC with estimated RA pressure of 8 mmHg.  This was subsequently followed by a TEE which showed posterior leaflet restriction with moderate 2-3+ MR with flow reversal in pulmonary veins.  LVEF for this study was 25 to 30%.     He subsequently represented to the hospital on 10/24/2021 with acute combined CHF/ICM.  He presented with shortness of breath and chest pain to the [facility] emergency department on 10/23/2021.  He had reported weight gain of approximately 7 pounds and chest x-ray revealed bilateral pleural effusions.  [facility]NP at [facility] was significantly elevated at 6000.  He was started on a course of diuretics and admitted for further medical optimization by the AHF team.  He is also noted to have a pericardial effusion but no evidence of tamponade and is being monitored clinically.  Cardiac catheterization shows severe two-vessel disease in the LAD and RCA systems.  It was felt the patient should undergo surgical intervention during this admission.  The risks and benefits of the procedure were explained to the patient and he was agreeable to proceed.    Hospital Course:  The patient was medically optimized by the advanced heart failure team.  He was medically stable and was taken to the operating room on 11/01/2021.  He underwent CABG x 2, Mitral Valve Replacement, MAZE  procedure with clipping of LA appendage and endoscopic harvest of greater saphenous vein from his right leg.  He tolerated the procedure without difficulty and was taken to the SICU in stable condition.  He was extubated the evening of surgery.  He was followed closely by the Advanced heart failure team.  He was weaned off Epinephrine, Neo-synephrine, and Milrinone drips as hemodynamics allowed.  He was started on coumadin for his mitral valve repair.  He has history of esophageal stricture and was having difficulty getting food down.  SLP evaluation was obtained and they recommended the patient be placed on a thin liquid diet.  He was started on IV diuretics for volume overloaded state.  His INR became supra-therapeutic after several doses of coumadin.  Due to this the medicine was held until INR was improved.  The patient continued to have difficulty with swallowing.  Barium esophogram was ordered and revealed a nonobstructive stricture.  Soft diet was ordered per recommendations.  Inotropic support with milrinone continued.  He was weaned off the norepinephrine.  The patient required midodrine to help with hypotension in the setting of aggressive diuresis.  He was hyponatremic and was treated with Tolvaptan.  He has been working with PT/OT who recommends home health, which has been arranged.  His Milrinone was weaned off on 11/13/2021.  His follow up CO-OX was 61.4.  He was felt stable for transfer to the progressive care unit on 11/13/2021.  The patient remained volume overloaded.  CVP levels were monitored regularly.  He remained on aggressive IV diuresis.  He remains on coumadin at 1 mg daily.  His most  recent INR is 2.2.  Farxiga 10 mg daily was added. As of 06/10, he was also on Amiodarone 200 mg bid, Midodrine 10 mg tid, Coumadin, and Digoxin 0.125 mg daily. He continues to work with physical therapy and is progressing well.  Surgical incisions are healing without evidence of infection.  He is medically stable  for discharge home today.

## 2021-11-01 NOTE — Transfer of Care (Signed)
Immediate Anesthesia Transfer of Care Note  Patient: Keith Jenkins  Procedure(s) Performed: MITRAL VALVE (MV) REPLACEMENT USING MITRIS VALVE. (Chest) CORONARY ARTERY BYPASS GRAFTING (CABG) X TWO USING OPEN LEFT INTERNAL MAMMARY ARTERY AND ENDOSOPIC RIGHT GREATER SAPHENOUS VEIN HARVEST. (Chest) MAZE (Chest) TRANSESOPHAGEAL ECHOCARDIOGRAM (TEE) (Chest) CLIPPING OF ATRIAL APPENDAGE USING AN ATRICLIP PRO2 (Chest)  Patient Location: ICU  Anesthesia Type:General  Level of Consciousness: Patient remains intubated per anesthesia plan  Airway & Oxygen Therapy: Patient remains intubated per anesthesia plan and Patient placed on Ventilator (see vital sign flow sheet for setting)  Post-op Assessment: Report given to RN and Post -op Vital signs reviewed and stable  Post vital signs: Reviewed and stable  Last Vitals:  Vitals Value Taken Time  BP 107/53 11/01/21 1520  Temp    Pulse 76 11/01/21 1530  Resp 12 11/01/21 1530  SpO2 94 % 11/01/21 1530  Vitals shown include unvalidated device data.  Last Pain:  Vitals:   11/01/21 0400  TempSrc: Oral  PainSc:       Patients Stated Pain Goal: 0 (10/28/21 1729)  Complications: No notable events documented.

## 2021-11-01 NOTE — Anesthesia Procedure Notes (Signed)
Central Venous Catheter Insertion Performed by: Val Eagle, MD, anesthesiologist Start/End5/26/2023 6:56 AM, 11/01/2021 7:11 AM Patient location: Pre-op. Preanesthetic checklist: patient identified, IV checked, site marked, risks and benefits discussed, surgical consent, monitors and equipment checked, pre-op evaluation, timeout performed and anesthesia consent Hand hygiene performed  and maximum sterile barriers used  PA cath was placed.Swan type:thermodilution Procedure performed without using ultrasound guided technique. Attempts: 1 Patient tolerated the procedure well with no immediate complications.

## 2021-11-01 NOTE — Interval H&P Note (Signed)
History and Physical Interval Note:  11/01/2021 6:35 AM  Keith Jenkins  has presented today for surgery, with the diagnosis of SEVERE MR CAD CHF.  The various methods of treatment have been discussed with the patient and family. After consideration of risks, benefits and other options for treatment, the patient has consented to  Procedure(s): MITRAL VALVE (MV) REPLACEMENT (N/A) CORONARY ARTERY BYPASS GRAFTING (CABG) (N/A) MAZE (N/A) TRANSESOPHAGEAL ECHOCARDIOGRAM (TEE) (N/A) as a surgical intervention.  The patient's history has been reviewed, patient examined, no change in status, stable for surgery.  I have reviewed the patient's chart and labs.  Questions were answered to the patient's satisfaction.     [name]

## 2021-11-01 NOTE — Anesthesia Preprocedure Evaluation (Signed)
Anesthesia Evaluation  Patient identified by MRN, date of birth, ID band Patient awake    Reviewed: Allergy & Precautions, NPO status , Patient's Chart, lab work & pertinent test results  History of Anesthesia Complications Negative for: history of anesthetic complications  Airway Mallampati: III  TM Distance: >3 FB Neck ROM: Full    Dental  (+) Dental Advisory Given   Pulmonary shortness of breath, former smoker,    breath sounds clear to auscultation       Cardiovascular + CAD, + Past MI and +CHF   Rhythm:Regular  .  Mid RCA lesion is 100% stenosed.  Faint left to right collaterals. .  Mid LAD lesion is 90% stenosed, at the origin of a large septal. .  Ost LAD to Prox LAD lesion is 40% stenosed. .  There is severe left ventricular systolic dysfunction. .  LV end diastolic pressure is moderately elevated. .  The left ventricular ejection fraction is less than 25% by visual estimate. .  There is no aortic valve stenosis.  RCA occlusion was likely the culprit.  I suspect this occurred on Fri/Sat of last week.  He has been pain free for several days.  Completed inferior infarct.  BP dropped with sedation and improved quickly post procedure  He has residual LAD disease.  BP was low and LVEDP was high.  Plan to diurese and initiate CHF meds. Plan for LAD intervention at a later time.  Will have to load with antiplatelet therapy.  Plan for LAD PCI at a later time.   1. Left ventricular ejection fraction, by estimation, is 30 to 35%. The  left ventricle has moderately decreased function. The left ventricle  demonstrates regional wall motion abnormalities. Inferior/inferolateral  akinesis.  2. Right ventricular systolic function is moderately reduced. The right  ventricular size is mildly enlarged. There is mildly elevated pulmonary  artery systolic pressure. The estimated right ventricular systolic  pressure is 33.8 mmHg.  3.  Eccentric posterior directed mitral regurgitation jet, not well  evaluated on current study  4. The inferior vena cava is dilated in size with <50% respiratory  variability, suggesting right atrial pressure of 15 mmHg.  5. Moderate pericardial effusion adjacent to RA/RV, best seen on  subcostal images. IVC fixed/dilated but no significant mitral inflow  respiratory variation or RV collapse seen to suggest tamponade    Neuro/Psych negative neurological ROS  negative psych ROS   GI/Hepatic Neg liver ROS, GERD  Medicated,Difficulty swallowing "have a stricture or hiatal hernia"   Endo/Other  diabetesLab Results      Component                Value               Date                      HGBA1C                   6.0 (H)             11/01/2021            Lab Results      Component                Value               Date                      NA  124 (L)             11/01/2021                K                        4.4                 11/01/2021                CO2                      28                  11/01/2021                GLUCOSE                  131 (H)             11/01/2021                BUN                      34 (H)              11/01/2021                CREATININE               1.40 (H)            11/01/2021                CALCIUM                  8.4 (L)             11/01/2021                EGFR                     91                  07/09/2021                GFRNONAA                 51 (L)              11/01/2021            hyponatremia  Renal/GU Renal InsufficiencyRenal diseaseLab Results      Component                Value               Date                      CREATININE               1.40 (H)            11/01/2021                Musculoskeletal  (+) Arthritis ,   Abdominal   Peds  Hematology  (+) Blood dyscrasia, anemia , Lab Results      Component                Value  Date                      WBC                       8.6                 11/01/2021                HGB                      11.6 (L)            11/01/2021                HCT                      34.0 (L)            11/01/2021                MCV                      83.7                11/01/2021                PLT                      239                 11/01/2021              Anesthesia Other Findings   Reproductive/Obstetrics                             Anesthesia Physical Anesthesia Plan  ASA: 4  Anesthesia Plan: General   Post-op Pain Management:    Induction: Intravenous  PONV Risk Score and Plan: 2 and Treatment may vary due to age or medical condition  Airway Management Planned: Oral ETT  Additional Equipment: Arterial line, CVP, PA Cath, TEE and Ultrasound Guidance Line Placement  Intra-op Plan:   Post-operative Plan: Post-operative intubation/ventilation  Informed Consent: I have reviewed the patients History and Physical, chart, labs and discussed the procedure including the risks, benefits and alternatives for the proposed anesthesia with the patient or authorized representative who has indicated his/her understanding and acceptance.     Dental advisory given  Plan Discussed with: CRNA  Anesthesia Plan Comments: (Will place TEE for esophageal views as successful in past. Might not be able to obtain transgastric views due to esophageal concern.)        Anesthesia Quick Evaluation

## 2021-11-01 NOTE — Anesthesia Procedure Notes (Signed)
Procedure Name: Intubation Date/Time: 11/01/2021 7:58 AM Performed by: Erick Colace, CRNA Pre-anesthesia Checklist: Patient identified, Emergency Drugs available, Suction available and Patient being monitored Patient Re-evaluated:Patient Re-evaluated prior to induction Oxygen Delivery Method: Circle system utilized Preoxygenation: Pre-oxygenation with 100% oxygen Induction Type: IV induction Ventilation: Mask ventilation without difficulty, Oral airway inserted - appropriate to patient size and Two handed mask ventilation required Laryngoscope Size: Mac and 4 Grade View: Grade II Tube type: Oral Tube size: 8.0 mm Number of attempts: 1 Airway Equipment and Method: Stylet and Oral airway Placement Confirmation: ETT inserted through vocal cords under direct vision, positive ETCO2 and breath sounds checked- equal and bilateral Secured at: 23 cm Tube secured with: Tape Dental Injury: Teeth and Oropharynx as per pre-operative assessment

## 2021-11-01 NOTE — Brief Op Note (Addendum)
10/24/2021 - 11/01/2021  1:34 PM  PATIENT:  Keith Jenkins  76 y.o. male  PRE-OPERATIVE DIAGNOSIS:  SEVERE MITRAL REGURGITATION, CORONARY ARTERY DISEASE, CONGESTIVE HEART FAILURE  POST-OPERATIVE DIAGNOSIS:  SEVERE MITRAL REGURGITATION, CORONARY ARTERY DISEASE, CONGESTIVE HEART FAILURE, BILATERAL PLEURAL EFFUSIONS  PROCEDURE:   MITRAL VALVE (MV) REPLACEMENT USING MITRIS VALVE.   CORONARY ARTERY BYPASS GRAFTING (CABG) X TWO USING OPEN LEFT INTERNAL MAMMARY ARTERY AND ENDOSOPIC RIGHT GREATER SAPHENOUS VEIN HARVEST.   LIMA-LAD  SVG-RAMUS INT.  MAZE PROCEDURE  TRANSESOPHAGEAL ECHOCARDIOGRAM   CLIPPING OF ATRIAL APPENDAGE USING AN ATRICLIP PRO2   Vein harvest time: Vein prep time:  SURGEON: [name], MD - Primary  PHYSICIAN ASSISTANT: Sonakshi Rolland  ASSISTANTS: Christella Scheuermann, RN, RN First Assistant   ANESTHESIA:   general  EBL: 150  BLOOD ADMINISTERED: 2 units FFP and 1 pheresis  PLTS  DRAINS:  Bilateral pleural and mediastinal drains    LOCAL MEDICATIONS USED:  NONE  SPECIMEN:  No Specimen  DISPOSITION OF SPECIMEN:  N/A  COUNTS:  Correct  DICTATION: .Dragon Dictation  PLAN OF CARE: Admit to inpatient   PATIENT DISPOSITION:  ICU - intubated and hemodynamically stable.   Delay start of Pharmacological VTE agent (>24hrs) due to surgical blood loss or risk of bleeding: yes

## 2021-11-01 NOTE — Progress Notes (Signed)
Rapid Wean begin.RT will cont to monitor.

## 2021-11-01 NOTE — Progress Notes (Signed)
Patient being transported to OR holding area.

## 2021-11-01 NOTE — Progress Notes (Signed)
EVENING ROUNDS NOTE :     [address]       [place] [address]             [phone]                 Day of Surgery Procedure(s) (LRB): MITRAL VALVE (MV) REPLACEMENT USING MITRIS VALVE. (N/A) CORONARY ARTERY BYPASS GRAFTING (CABG) X TWO USING OPEN LEFT INTERNAL MAMMARY ARTERY AND ENDOSOPIC RIGHT GREATER SAPHENOUS VEIN HARVEST. (N/A) MAZE (N/A) TRANSESOPHAGEAL ECHOCARDIOGRAM (TEE) (N/A) CLIPPING OF ATRIAL APPENDAGE USING AN ATRICLIP PRO2 (N/A)   Total Length of Stay:  LOS: 8 days  Events:   Low CT output Low CO, stable BP Will wean to extubate    BP (!) 107/53   Pulse 78   Temp 97.7 F (36.5 C)   Resp 12   Ht 6\' 1"  (1.854 m)   Wt 93.2 kg   SpO2 99%   BMI 27.11 kg/m   PAP: (17-289)/(1-203) 28/15 CVP:  [7 mmHg-9 mmHg] 9 mmHg CO:  [2.8 L/min-3.1 L/min] 3.1 L/min CI:  [1.3 L/min/m2-1.4 L/min/m2] 1.4 L/min/m2  Vent Mode: PRVC;PSV;SIMV FiO2 (%):  [50 %] 50 % Set Rate:  [12 bmp] 12 bmp Vt Set:  [630 mL] 630 mL PEEP:  [5 cmH20] 5 cmH20 Pressure Support:  [10 cmH20] 10 cmH20 Plateau Pressure:  [20 cmH20] 20 cmH20   sodium chloride     [START ON 11/02/2021] sodium chloride     sodium chloride     albumin human 12.5 g (11/01/21 1635)   amiodarone 30 mg/hr (11/01/21 1700)    ceFAZolin (ANCEF) IV 200 mL/hr at 11/01/21 1700   dexmedetomidine (PRECEDEX) IV infusion 0.5 mcg/kg/hr (11/01/21 1700)   epinephrine 5 mcg/min (11/01/21 1700)   famotidine (PEPCID) IV Stopped (11/01/21 1633)   insulin 1.5 Units/hr (11/01/21 1700)   lactated ringers     lactated ringers     lactated ringers     magnesium sulfate     milrinone 0.25 mcg/kg/min (11/01/21 1700)   nitroGLYCERIN     norepinephrine (LEVOPHED) Adult infusion     phenylephrine (NEO-SYNEPHRINE) Adult infusion 60 mcg/min (11/01/21 1700)   potassium chloride 50 mL/hr at 11/01/21 1700   vancomycin      I/O last 3 completed shifts: In: 2069.3 [P.O.:480; I.V.:1589.3] Out: 1300  [Urine:1300]      Latest Ref Rng & Units 11/01/2021    3:50 PM 11/01/2021    2:20 PM 11/01/2021    2:17 PM  CBC  WBC 4.0 - 10.5 K/uL 27.2      Hemoglobin 13.0 - 17.0 g/dL 11/03/2021   9.2   8.8    Hematocrit 39.0 - 52.0 % 30.9   27.0   26.0    Platelets 150 - 400 K/uL 161           Latest Ref Rng & Units 11/01/2021    2:20 PM 11/01/2021    2:17 PM 11/01/2021    1:28 PM  BMP  Glucose 70 - 99 mg/dL 11/03/2021    485    BUN 8 - 23 mg/dL 31    34    Creatinine 0.61 - 1.24 mg/dL 462    7.03    Sodium 135 - 145 mmol/L 127   128   126    Potassium 3.5 - 5.1 mmol/L 4.1   4.1   4.9    Chloride 98 - 111 mmol/L 91    90  ABG    Component Value Date/Time   PHART 7.403 11/01/2021 1417   PCO2ART 45.4 11/01/2021 1417   PO2ART 427 (H) 11/01/2021 1417   HCO3 28.3 (H) 11/01/2021 1417   TCO2 29 11/01/2021 1420   O2SAT 100 11/01/2021 1417       Brynda Greathouse, MD 11/01/2021 5:21 PM

## 2021-11-02 ENCOUNTER — Inpatient Hospital Stay (HOSPITAL_COMMUNITY): Payer: Medicare HMO

## 2021-11-02 DIAGNOSIS — I5043 Acute on chronic combined systolic (congestive) and diastolic (congestive) heart failure: Secondary | ICD-10-CM | POA: Diagnosis not present

## 2021-11-02 LAB — BASIC METABOLIC PANEL
Anion gap: 11 (ref 5–15)
Anion gap: 10 (ref 5–15)
BUN: 30 mg/dL — ABNORMAL HIGH (ref 8–23)
BUN: 32 mg/dL — ABNORMAL HIGH (ref 8–23)
CO2: 25 mmol/L (ref 22–32)
CO2: 25 mmol/L (ref 22–32)
Calcium: 8 mg/dL — ABNORMAL LOW (ref 8.9–10.3)
Calcium: 7.9 mg/dL — ABNORMAL LOW (ref 8.9–10.3)
Chloride: 97 mmol/L — ABNORMAL LOW (ref 98–111)
Chloride: 96 mmol/L — ABNORMAL LOW (ref 98–111)
Creatinine, Ser: 1.4 mg/dL — ABNORMAL HIGH (ref 0.61–1.24)
Creatinine, Ser: 1.4 mg/dL — ABNORMAL HIGH (ref 0.61–1.24)
GFR, Estimated: 53 mL/min — ABNORMAL LOW (ref >60)
GFR, Estimated: 53 mL/min — ABNORMAL LOW (ref >60)
Glucose, Bld: 145 mg/dL — ABNORMAL HIGH (ref 70–99)
Glucose, Bld: 185 mg/dL — ABNORMAL HIGH (ref 70–99)
Potassium: 3.8 mmol/L (ref 3.5–5.1)
Potassium: 4.5 mmol/L (ref 3.5–5.1)
Sodium: 133 mmol/L — ABNORMAL LOW (ref 135–145)
Sodium: 131 mmol/L — ABNORMAL LOW (ref 135–145)

## 2021-11-02 LAB — POCT I-STAT 7, (LYTES, BLD GAS, ICA,H+H)
Acid-base deficit: 1 mmol/L (ref 0.0–2.0)
Acid-base deficit: 3 mmol/L — ABNORMAL HIGH (ref 0.0–2.0)
Acid-base deficit: 7 mmol/L — ABNORMAL HIGH (ref 0.0–2.0)
Bicarbonate: 24.7 mmol/L (ref 20.0–28.0)
Bicarbonate: 19.8 mmol/L — ABNORMAL LOW (ref 20.0–28.0)
Bicarbonate: 23.7 mmol/L (ref 20.0–28.0)
Calcium, Ion: 1.22 mmol/L (ref 1.15–1.40)
Calcium, Ion: 1.18 mmol/L (ref 1.15–1.40)
Calcium, Ion: 1.2 mmol/L (ref 1.15–1.40)
HCT: 31 % — ABNORMAL LOW (ref 39.0–52.0)
HCT: 30 % — ABNORMAL LOW (ref 39.0–52.0)
HCT: 32 % — ABNORMAL LOW (ref 39.0–52.0)
Hemoglobin: 10.5 g/dL — ABNORMAL LOW (ref 13.0–17.0)
Hemoglobin: 10.2 g/dL — ABNORMAL LOW (ref 13.0–17.0)
Hemoglobin: 10.9 g/dL — ABNORMAL LOW (ref 13.0–17.0)
O2 Saturation: 93 %
O2 Saturation: 98 %
O2 Saturation: 99 %
Patient temperature: 36.4
Patient temperature: 36.7
Patient temperature: 37
Potassium: 3.9 mmol/L (ref 3.5–5.1)
Potassium: 3.8 mmol/L (ref 3.5–5.1)
Potassium: 3.9 mmol/L (ref 3.5–5.1)
Sodium: 128 mmol/L — ABNORMAL LOW (ref 135–145)
Sodium: 129 mmol/L — ABNORMAL LOW (ref 135–145)
Sodium: 131 mmol/L — ABNORMAL LOW (ref 135–145)
TCO2: 26 mmol/L (ref 22–32)
TCO2: 21 mmol/L — ABNORMAL LOW (ref 22–32)
TCO2: 25 mmol/L (ref 22–32)
pCO2 arterial: 42.3 mmHg (ref 32–48)
pCO2 arterial: 43.4 mmHg (ref 32–48)
pCO2 arterial: 48 mmHg (ref 32–48)
pH, Arterial: 7.371 (ref 7.35–7.45)
pH, Arterial: 7.265 — ABNORMAL LOW (ref 7.35–7.45)
pH, Arterial: 7.3 — ABNORMAL LOW (ref 7.35–7.45)
pO2, Arterial: 68 mmHg — ABNORMAL LOW (ref 83–108)
pO2, Arterial: 110 mmHg — ABNORMAL HIGH (ref 83–108)
pO2, Arterial: 161 mmHg — ABNORMAL HIGH (ref 83–108)

## 2021-11-02 LAB — CBC
HCT: 29.9 % — ABNORMAL LOW (ref 39.0–52.0)
HCT: 27.7 % — ABNORMAL LOW (ref 39.0–52.0)
Hemoglobin: 10.4 g/dL — ABNORMAL LOW (ref 13.0–17.0)
Hemoglobin: 9.7 g/dL — ABNORMAL LOW (ref 13.0–17.0)
MCH: 29.9 pg (ref 26.0–34.0)
MCH: 30.1 pg (ref 26.0–34.0)
MCHC: 34.8 g/dL (ref 30.0–36.0)
MCHC: 35 g/dL (ref 30.0–36.0)
MCV: 85.9 fL (ref 80.0–100.0)
MCV: 86 fL (ref 80.0–100.0)
Platelets: 155 10*3/uL (ref 150–400)
Platelets: 122 10*3/uL — ABNORMAL LOW (ref 150–400)
RBC: 3.48 MIL/uL — ABNORMAL LOW (ref 4.22–5.81)
RBC: 3.22 MIL/uL — ABNORMAL LOW (ref 4.22–5.81)
RDW: 13.5 % (ref 11.5–15.5)
RDW: 13.5 % (ref 11.5–15.5)
WBC: 18.4 10*3/uL — ABNORMAL HIGH (ref 4.0–10.5)
WBC: 21.6 10*3/uL — ABNORMAL HIGH (ref 4.0–10.5)
nRBC: 0 % (ref 0.0–0.2)
nRBC: 0 % (ref 0.0–0.2)

## 2021-11-02 LAB — PREPARE PLATELET PHERESIS: Unit division: 0

## 2021-11-02 LAB — PREPARE FRESH FROZEN PLASMA

## 2021-11-02 LAB — GLUCOSE, CAPILLARY
Glucose-Capillary: 208 mg/dL — ABNORMAL HIGH (ref 70–99)
Glucose-Capillary: 185 mg/dL — ABNORMAL HIGH (ref 70–99)
Glucose-Capillary: 149 mg/dL — ABNORMAL HIGH (ref 70–99)
Glucose-Capillary: 170 mg/dL — ABNORMAL HIGH (ref 70–99)
Glucose-Capillary: 137 mg/dL — ABNORMAL HIGH (ref 70–99)
Glucose-Capillary: 127 mg/dL — ABNORMAL HIGH (ref 70–99)
Glucose-Capillary: 125 mg/dL — ABNORMAL HIGH (ref 70–99)
Glucose-Capillary: 130 mg/dL — ABNORMAL HIGH (ref 70–99)
Glucose-Capillary: 150 mg/dL — ABNORMAL HIGH (ref 70–99)
Glucose-Capillary: 184 mg/dL — ABNORMAL HIGH (ref 70–99)
Glucose-Capillary: 192 mg/dL — ABNORMAL HIGH (ref 70–99)

## 2021-11-02 LAB — COOXEMETRY PANEL
Carboxyhemoglobin: 1.5 % (ref 0.5–1.5)
Methemoglobin: <0.7 % (ref 0.0–1.5)
O2 Saturation: 67.2 %
Total hemoglobin: 9.9 g/dL — ABNORMAL LOW (ref 12.0–16.0)

## 2021-11-02 LAB — BPAM PLATELET PHERESIS
Blood Product Expiration Date: 202305282359
ISSUE DATE / TIME: 202305261251
Unit Type and Rh: 5100

## 2021-11-02 LAB — BPAM FFP
Blood Product Expiration Date: 202305302359
Blood Product Expiration Date: 202305302359
ISSUE DATE / TIME: 202305261253
ISSUE DATE / TIME: 202305261253
Unit Type and Rh: 600
Unit Type and Rh: 6200

## 2021-11-02 LAB — MAGNESIUM
Magnesium: 3.2 mg/dL — ABNORMAL HIGH (ref 1.7–2.4)
Magnesium: 2.6 mg/dL — ABNORMAL HIGH (ref 1.7–2.4)

## 2021-11-02 MED ORDER — SODIUM BICARBONATE 8.4 % IV SOLN
100.0000 meq | Freq: Once | INTRAVENOUS | Status: AC
  Administered 2021-11-02: 100 meq via INTRAVENOUS
  Filled 2021-11-02: qty 100

## 2021-11-02 MED ORDER — POTASSIUM CHLORIDE CRYS ER 20 MEQ PO TBCR: 40.0000 meq | EXTENDED_RELEASE_TABLET | Freq: Once | ORAL | Status: DC

## 2021-11-02 MED ORDER — SODIUM CHLORIDE 0.9% FLUSH
10.0000 mL | Freq: Two times a day (BID) | INTRAVENOUS | Status: DC
  Administered 2021-11-02 – 2021-11-03 (×3): 10 mL
  Administered 2021-11-04: 40 mL
  Administered 2021-11-04 – 2021-11-07 (×6): 10 mL
  Administered 2021-11-07: 40 mL
  Administered 2021-11-08: 10 mL
  Administered 2021-11-08: 40 mL
  Administered 2021-11-09 – 2021-11-11 (×6): 10 mL
  Administered 2021-11-12: 20 mL
  Administered 2021-11-12 – 2021-11-16 (×8): 10 mL
  Administered 2021-11-17: 20 mL
  Administered 2021-11-17 – 2021-11-18 (×2): 10 mL

## 2021-11-02 MED ORDER — AMIODARONE HCL IN DEXTROSE 360-4.14 MG/200ML-% IV SOLN
30.0000 mg/h | INTRAVENOUS | Status: DC
  Administered 2021-11-02 – 2021-11-06 (×10): 30 mg/h via INTRAVENOUS
  Filled 2021-11-02 (×8): qty 200

## 2021-11-02 MED ORDER — ENOXAPARIN SODIUM 30 MG/0.3ML IJ SOSY
30.0000 mg | PREFILLED_SYRINGE | Freq: Every day | INTRAMUSCULAR | Status: DC
  Administered 2021-11-02: 30 mg via SUBCUTANEOUS
  Filled 2021-11-02: qty 0.3

## 2021-11-02 MED ORDER — INSULIN ASPART 100 UNIT/ML IJ SOLN
0.0000 [IU] | INTRAMUSCULAR | Status: DC
  Administered 2021-11-02: 2 [IU] via SUBCUTANEOUS
  Administered 2021-11-02 – 2021-11-04 (×9): 4 [IU] via SUBCUTANEOUS
  Administered 2021-11-04 (×4): 2 [IU] via SUBCUTANEOUS
  Administered 2021-11-04 – 2021-11-05 (×5): 4 [IU] via SUBCUTANEOUS
  Administered 2021-11-05 – 2021-11-06 (×2): 2 [IU] via SUBCUTANEOUS
  Administered 2021-11-06: 4 [IU] via SUBCUTANEOUS

## 2021-11-02 MED ORDER — WARFARIN SODIUM 2.5 MG PO TABS
2.5000 mg | ORAL_TABLET | Freq: Every day | ORAL | Status: DC
Start: 2021-11-02 — End: 2021-11-03
  Administered 2021-11-02: 2.5 mg via ORAL
  Filled 2021-11-02 (×2): qty 1

## 2021-11-02 MED ORDER — FUROSEMIDE 10 MG/ML IJ SOLN
40.0000 mg | Freq: Once | INTRAMUSCULAR | Status: AC
  Administered 2021-11-02: 40 mg via INTRAVENOUS
  Filled 2021-11-02: qty 4

## 2021-11-02 MED ORDER — WARFARIN - PHYSICIAN DOSING INPATIENT: Freq: Every day | Status: DC

## 2021-11-02 MED ORDER — ORAL CARE MOUTH RINSE
15.0000 mL | Freq: Two times a day (BID) | OROMUCOSAL | Status: DC
  Administered 2021-11-02 – 2021-11-05 (×6): 15 mL via OROMUCOSAL

## 2021-11-02 MED ORDER — SODIUM CHLORIDE 0.9% FLUSH: 10.0000 mL | INTRAVENOUS | Status: DC | PRN

## 2021-11-02 MED ORDER — POTASSIUM CHLORIDE 20 MEQ PO PACK
40.0000 meq | PACK | Freq: Once | ORAL | Status: AC
  Administered 2021-11-02: 40 meq via ORAL
  Filled 2021-11-02: qty 2

## 2021-11-02 MED ORDER — ALBUMIN HUMAN 5 % IV SOLN
250.0000 mL | INTRAVENOUS | Status: AC | PRN
  Filled 2021-11-02: qty 250

## 2021-11-02 MED ORDER — FENTANYL CITRATE PF 50 MCG/ML IJ SOSY
25.0000 ug | PREFILLED_SYRINGE | INTRAMUSCULAR | Status: DC | PRN
Start: 2021-11-02 — End: 2021-11-05

## 2021-11-02 NOTE — Procedures (Signed)
Extubation Procedure Note  Patient Details:   Name: Keith Jenkins DOB: 1945-06-24 MRN: [id number]   Airway Documentation:  Airway 8 mm (Active)  Secured at (cm) 23 cm 11/02/21 0024  Measured From Lips 11/02/21 0024  Secured Location Right 11/02/21 0024  Secured By [employer] 11/02/21 0024  Tube Holder Repositioned Yes 11/02/21 0024  Prone position No 11/02/21 0024  Cuff Pressure (cm H2O) MOV (Manual Technique) 11/02/21 0024  Site Condition Dry 11/02/21 0024   Vent end date: 11/02/21  Vent end time: 12:50 AM  Evaluation  O2 sats: stable throughout Complications: No apparent complications Patient did tolerate procedure well. Bilateral Breath Sounds: Clear, Diminished   Pt able to verbalize name and DOB. Placed on 2L [place], VSS.  NIF -35 VC 2L  [place] G Rasheda Ledger 11/02/2021, 12:50 AM

## 2021-11-02 NOTE — Progress Notes (Signed)
Advanced Heart Failure Rounding Note  PCP-Cardiologist: None   Subjective:    05/19: Admit with a/c CHF. CO-OX okay but appeared low-output >>started milrinone 0.25 05/22: Developed A flutter. Started on amio drip. Diuresd with IV lasix + metolazone 05/23: Chest pain. HS Trop 422>395. Started on imdur.  05/26: S/p CABG x2 (LIMA-LAD, SVG-RAMUS INT) +MVR, MAZE, LAA Clip   Extubated early this am. Chest sore. Denies SOB, orthopnea or PND   On epi 5, NE 25, milrinone 0.25. Co-ox 67% Scr up slightly to 1.4. Hgb 10.4  Last swan numbers  CVP 10 PA 33/22 Thermo 4.9/2.3   Objective:   Weight Range: 91.4 kg Body mass index is 26.58 kg/m.   Vital Signs:   Temp:  [97.2 F (36.2 C)-99 F (37.2 C)] 99 F (37.2 C) (05/27 0815) Pulse Rate:  [74-113] 107 (05/27 0815) Resp:  [10-22] 13 (05/27 0815) BP: (107-124)/(53-69) 124/69 (05/26 1954) SpO2:  [93 %-100 %] 100 % (05/27 0815) Arterial Line BP: (52-126)/(33-72) 105/64 (05/27 0815) FiO2 (%):  [40 %-50 %] 40 % (05/27 0024) Weight:  [91.4 kg] 91.4 kg (05/27 0500) Last BM Date : 10/29/21  Weight change: Filed Weights   10/31/21 0222 11/01/21 0400 11/02/21 0500  Weight: 93.1 kg 93.2 kg 91.4 kg    Intake/Output:   Intake/Output Summary (Last 24 hours) at 11/02/2021 0833 Last data filed at 11/02/2021 0800 Gross per 24 hour  Intake 6203.23 ml  Output 5359 ml  Net 844.23 ml       Physical Exam   General:  Sitting up in bed. No resp difficulty HEENT: normal Neck: supple. RIJ swan Carotids 2+ bilat; no bruits. No lymphadenopathy or thryomegaly appreciated. Cor: Sternal dressing ok. RRR + CTs Lungs: decreased efforts Abdomen: soft, nontender, nondistended. No hepatosplenomegaly. No bruits or masses. Hypoactve bowel sounds. Extremities: no cyanosis, clubbing, rash, tr edema Neuro: alert & orientedx3, cranial nerves grossly intact. moves all 4 extremities w/o difficulty. Affect pleasant   Telemetry   A-V paced 80s,  personally reviewed   Labs    CBC Recent Labs    11/01/21 2055 11/01/21 2101 11/02/21 0151 11/02/21 0319  WBC 23.7*  --   --  18.4*  HGB 11.2*   < > 10.2* 10.4*  HCT 32.2*   < > 30.0* 29.9*  MCV 86.8  --   --  85.9  PLT 182  --   --  155   < > = values in this interval not displayed.    Basic Metabolic Panel Recent Labs    93/23/55 2055 11/01/21 2101 11/02/21 0151 11/02/21 0319  NA 129*   < > 131* 133*  K 4.4   < > 3.8 3.8  CL 96*  --   --  97*  CO2 19*  --   --  25  GLUCOSE 181*  --   --  145*  BUN 32*  --   --  30*  CREATININE 1.37*  --   --  1.40*  CALCIUM 8.1*  --   --  8.0*  MG 3.2*  --   --  3.2*   < > = values in this interval not displayed.    Liver Function Tests No results for input(s): AST, ALT, ALKPHOS, BILITOT, PROT, ALBUMIN in the last 72 hours. No results for input(s): LIPASE, AMYLASE in the last 72 hours. Cardiac Enzymes No results for input(s): CKTOTAL, CKMB, CKMBINDEX, TROPONINI in the last 72 hours.  BNP: BNP (last 3 results) Recent Labs  10/10/21 1606 10/22/21 1623 10/24/21 2150  BNP 1,564.6* 1,226.7* 1,306.1*     ProBNP (last 3 results) No results for input(s): PROBNP in the last 8760 hours.   D-Dimer No results for input(s): DDIMER in the last 72 hours. Hemoglobin A1C Recent Labs    11/01/21 0430  HGBA1C 6.0*    Fasting Lipid Panel No results for input(s): CHOL, HDL, LDLCALC, TRIG, CHOLHDL, LDLDIRECT in the last 72 hours. Thyroid Function Tests No results for input(s): TSH, T4TOTAL, T3FREE, THYROIDAB in the last 72 hours.  Invalid input(s): FREET3  Other results:   Imaging    DG Chest Port 1 View  Result Date: 11/01/2021 CLINICAL DATA:  Status post mitral valve replacement EXAM: PORTABLE CHEST 1 VIEW COMPARISON:  Previous studies including the examination of 10/29/2021 FINDINGS: Transverse diameter of heart is increased. There is interval decrease in pulmonary vascular congestion and pulmonary edema. There is  increased density in the left lower lung fields obscuring left hemidiaphragm. There is interval placement of prosthetic mitral valve. There is a metallic clamp in the region of left atrial appendage. Tip endotracheal tube is proximally 4.2 cm above the carina. Enteric tube is noted traversing the esophagus. Tip of Swan-Ganz catheter is seen in the region of main pulmonary artery. Mediastinal drain is noted. Bilateral chest tubes are seen. Distal course of right PICC line is not distinctly visualized. There is small left apical pneumothorax. There is no significant pleural effusion. IMPRESSION: Cardiomegaly. There is interval decrease in CHF. Increased density in the medial left lower lung fields may suggest atelectasis/pneumonia. Small left apical pneumothorax. Other findings as described in the body of the report. Electronically Signed   By: Elmer Picker M.D.   On: 11/01/2021 16:04     Medications:     Scheduled Medications:  acetaminophen  1,000 mg Oral Q6H   Or   acetaminophen (TYLENOL) oral liquid 160 mg/5 mL  1,000 mg Per Tube Q6H   aspirin EC  325 mg Oral Daily   Or   aspirin  324 mg Per Tube Daily   atorvastatin  80 mg Oral Daily   bisacodyl  10 mg Oral Daily   Or   bisacodyl  10 mg Rectal Daily   chlorhexidine  15 mL Mouth/Throat NOW   Chlorhexidine Gluconate Cloth  6 each Topical Q0600   docusate sodium  200 mg Oral Daily   escitalopram  10 mg Oral Daily   latanoprost  1 drop Both Eyes QHS   [START ON 11/03/2021] pantoprazole  40 mg Oral Daily   sodium chloride flush  3 mL Intravenous Q12H    Infusions:  sodium chloride 20 mL/hr at 11/02/21 0820   sodium chloride     sodium chloride     albumin human     amiodarone 30 mg/hr (11/02/21 0800)    ceFAZolin (ANCEF) IV Stopped (11/02/21 0534)   epinephrine 5 mcg/min (11/02/21 0800)   insulin 1.9 Units/hr (11/02/21 0800)   lactated ringers     lactated ringers     milrinone 0.25 mcg/kg/min (11/02/21 0800)   nitroGLYCERIN      norepinephrine (LEVOPHED) Adult infusion 25 mcg/min (11/02/21 0800)    PRN Medications: sodium chloride, albumin human, dextrose, morphine injection, ondansetron (ZOFRAN) IV, oxyCODONE, sodium chloride flush, traMADol    Patient Profile   76 y.o. male with hx COPD, prediabetes, HLD, GERD. Recent admit earlier this month with later presentation inferior STEMI and new systolic CHF w/ moderate to severe ischemic MR.   Readmitted with  a/c CHF. Optimized from HF standpoint. Underwent CABG w MVR, MAZE and LAA clip 5/26.   Assessment/Plan   1. CAD/recent late presentation inferior STEMI - LHC 05/04: 100% m RCA and 90% m LAD.  - RCA managed medically d/t completed infarct.  - Echo (5/23) EF 25%, RV moderately reduced, moderate to severe MR - 5/26: S/p CABG x 2 (LIMA-LAD, SVG-Ramus INT)   - ASA + high intensity statin    2. Acute on chronic systolic CHF with low-output/ICM - s/p late presentation inferior STEMI 05/23 - Echo 05/23 EF 25-30%, RV moderately reduced, moderate to severe MR. - Limited echo on admit EF 30-35%, RV moderately reduced, MR not well evaluated, moderate pericardial effusion - S/p CABG + MVR 5/26 - on Milrinone 0.25, NE 25 + Epi 5. CI improved to 2.3 - wean NE slowly. Will give one dose IV lasix 40  - follow swan hemodynamics and co-ox - Given age, fraility and reduced LV function suspect may be slow inotrope wean.   3. Mitral regurgitation - Moderate to severe ischemic MR on TEE.  - S/p tissue MVR 11/02/21 - low-dose warfarin per TCTS   4. Aortic valve insufficiency - Mild to moderate AS/AI - stable.    5. Pericardial effusion/Acute Pericarditis  - Moderate in size on echo, no respiratory variation or RV collapse to suggest tamponade - acute pericarditis/ adhensions noted at time of CABG - c/w CTs   6. H/o AKI - Scr up slightly 1.10 > 1.4 - Watch renal function closely    7. Prediabetes - A1c 5.8 1/23 - holding [place] for now   8. PAF - s/p  MAZE w/ CABG  - continue amio gtt while on inotrope  - a/c once ok to resume per CT surgery   9. Hyponatremia  - Na 133 today    CRITICAL CARE Performed by: Glori Bickers  Total critical care time: 45 minutes  Critical care time was exclusive of separately billable procedures and treating other patients.  Critical care was necessary to treat or prevent imminent or life-threatening deterioration.  Critical care was time spent personally by me (independent of midlevel providers or residents) on the following activities: development of treatment plan with patient and/or surrogate as well as nursing, discussions with consultants, evaluation of patient's response to treatment, examination of patient, obtaining history from patient or surrogate, ordering and performing treatments and interventions, ordering and review of laboratory studies, ordering and review of radiographic studies, pulse oximetry and re-evaluation of patient's condition.   Length of Stay: [place], MD  11/02/2021, 8:33 AM  Advanced Heart Failure Team Pager [phone] (M-F; 7a - 5p)  Please contact [facility] for night-coverage after hours (5p -7a ) and weekends on [email or web address]

## 2021-11-02 NOTE — Progress Notes (Signed)
[address]       [place] [address]             [phone]                 1 Day Post-Op Procedure(s) (LRB): MITRAL VALVE (MV) REPLACEMENT USING MITRIS VALVE. (N/A) CORONARY ARTERY BYPASS GRAFTING (CABG) X TWO USING OPEN LEFT INTERNAL MAMMARY ARTERY AND ENDOSOPIC RIGHT GREATER SAPHENOUS VEIN HARVEST. (N/A) MAZE (N/A) TRANSESOPHAGEAL ECHOCARDIOGRAM (TEE) (N/A) CLIPPING OF ATRIAL APPENDAGE USING AN ATRICLIP PRO2 (N/A)   Events: No events extubated _______________________________________________________________ Vitals: BP 111/73   Pulse 97   Temp 98.8 F (37.1 C)   Resp 19   Ht 6\' 1"  (1.854 m)   Wt 91.4 kg   SpO2 100%   BMI 26.58 kg/m  Filed Weights   10/31/21 0222 11/01/21 0400 11/02/21 0500  Weight: 93.1 kg 93.2 kg 91.4 kg     - Neuro: alert NAD  - Cardiovascular: sinus with PVCs  Drips: milr 0.25, levo 25, epi 5.   PAP: (17-42)/(10-26) 36/21 CVP:  [9 mmHg] 9 mmHg CO:  [2.8 L/min-4.9 L/min] 4.9 L/min CI:  [1.3 L/min/m2-2.3 L/min/m2] 2.3 L/min/m2  - Pulm: EWOB   ABG    Component Value Date/Time   PHART 7.300 (L) 11/02/2021 0151   PCO2ART 48.0 11/02/2021 0151   PO2ART 110 (H) 11/02/2021 0151   HCO3 23.7 11/02/2021 0151   TCO2 25 11/02/2021 0151   ACIDBASEDEF 3.0 (H) 11/02/2021 0151   O2SAT 67.2 11/02/2021 0319    - Abd: ND - Extremity: warm  .Intake/Output      05/26 0701 05/27 0700 05/27 0701 05/28 0700   P.O.     I.V. (mL/kg) 2622.3 (28.7) 382.4 (4.2)   Blood 1768    NG/GT 60    IV Piggyback 1542.3    Total Intake(mL/kg) 5992.7 (65.6) 382.4 (4.2)   Urine (mL/kg/hr) 2515 (1.1) 120 (0.4)   Blood 1784    Chest Tube 1020 90   Total Output 5319 210   Net +673.7 +172.4           _______________________________________________________________ Labs:    Latest Ref Rng & Units 11/02/2021    3:19 AM 11/02/2021    1:51 AM 11/02/2021   12:22 AM  CBC  WBC 4.0 - 10.5 K/uL 18.4      Hemoglobin 13.0 - 17.0 g/dL  11/04/2021   02.7   74.1    Hematocrit 39.0 - 52.0 % 29.9   30.0   32.0    Platelets 150 - 400 K/uL 155          Latest Ref Rng & Units 11/02/2021    3:19 AM 11/02/2021    1:51 AM 11/02/2021   12:22 AM  CMP  Glucose 70 - 99 mg/dL 11/04/2021      BUN 8 - 23 mg/dL 30      Creatinine 867 - 1.24 mg/dL 6.72      Sodium 0.94 - 145 mmol/L 133   131   129    Potassium 3.5 - 5.1 mmol/L 3.8   3.8   3.9    Chloride 98 - 111 mmol/L 97      CO2 22 - 32 mmol/L 25      Calcium 8.9 - 10.3 mg/dL 8.0        CXR: clear  _______________________________________________________________  Assessment and Plan: POD 1 s/p CABG, MVR, MAZE procedure  Neuro: pain controlled CV: weaning drips  per HF Pulm: pulm hyg Renal: creat up slightly.  Will continue to follw GI: clears Heme: stable.  Starting coumadin today ID: [email or web address] Endo: SSI Dispo: continue ICU care   Corliss Skains 11/02/2021 10:25 AM

## 2021-11-03 ENCOUNTER — Inpatient Hospital Stay (HOSPITAL_COMMUNITY): Payer: Medicare HMO

## 2021-11-03 DIAGNOSIS — I5043 Acute on chronic combined systolic (congestive) and diastolic (congestive) heart failure: Secondary | ICD-10-CM | POA: Diagnosis not present

## 2021-11-03 LAB — BASIC METABOLIC PANEL
Anion gap: 9 (ref 5–15)
BUN: 36 mg/dL — ABNORMAL HIGH (ref 8–23)
CO2: 27 mmol/L (ref 22–32)
Calcium: 7.8 mg/dL — ABNORMAL LOW (ref 8.9–10.3)
Chloride: 95 mmol/L — ABNORMAL LOW (ref 98–111)
Creatinine, Ser: 1.38 mg/dL — ABNORMAL HIGH (ref 0.61–1.24)
GFR, Estimated: 54 mL/min — ABNORMAL LOW (ref >60)
Glucose, Bld: 161 mg/dL — ABNORMAL HIGH (ref 70–99)
Potassium: 4.3 mmol/L (ref 3.5–5.1)
Sodium: 131 mmol/L — ABNORMAL LOW (ref 135–145)

## 2021-11-03 LAB — CBC
HCT: 27.1 % — ABNORMAL LOW (ref 39.0–52.0)
Hemoglobin: 9.2 g/dL — ABNORMAL LOW (ref 13.0–17.0)
MCH: 29.5 pg (ref 26.0–34.0)
MCHC: 33.9 g/dL (ref 30.0–36.0)
MCV: 86.9 fL (ref 80.0–100.0)
Platelets: 120 10*3/uL — ABNORMAL LOW (ref 150–400)
RBC: 3.12 MIL/uL — ABNORMAL LOW (ref 4.22–5.81)
RDW: 13.8 % (ref 11.5–15.5)
WBC: 21.4 10*3/uL — ABNORMAL HIGH (ref 4.0–10.5)
nRBC: 0 % (ref 0.0–0.2)

## 2021-11-03 LAB — COOXEMETRY PANEL
Carboxyhemoglobin: 0.7 % (ref 0.5–1.5)
Methemoglobin: <0.7 % (ref 0.0–1.5)
O2 Saturation: 67.9 %
Total hemoglobin: 9 g/dL — ABNORMAL LOW (ref 12.0–16.0)

## 2021-11-03 LAB — PROTIME-INR
INR: 2.2 — ABNORMAL HIGH (ref 0.8–1.2)
Prothrombin Time: 24 seconds — ABNORMAL HIGH (ref 11.4–15.2)

## 2021-11-03 LAB — GLUCOSE, CAPILLARY
Glucose-Capillary: 147 mg/dL — ABNORMAL HIGH (ref 70–99)
Glucose-Capillary: 161 mg/dL — ABNORMAL HIGH (ref 70–99)
Glucose-Capillary: 165 mg/dL — ABNORMAL HIGH (ref 70–99)
Glucose-Capillary: 165 mg/dL — ABNORMAL HIGH (ref 70–99)
Glucose-Capillary: 166 mg/dL — ABNORMAL HIGH (ref 70–99)
Glucose-Capillary: 167 mg/dL — ABNORMAL HIGH (ref 70–99)
Glucose-Capillary: 173 mg/dL — ABNORMAL HIGH (ref 70–99)

## 2021-11-03 MED ORDER — MORPHINE SULFATE (PF) 2 MG/ML IV SOLN
2.0000 mg | INTRAVENOUS | Status: DC | PRN
  Administered 2021-11-03 – 2021-11-04 (×4): 2 mg via INTRAVENOUS
  Administered 2021-11-04: 4 mg via INTRAVENOUS
  Administered 2021-11-05 – 2021-11-07 (×4): 2 mg via INTRAVENOUS
  Filled 2021-11-03 (×3): qty 1
  Filled 2021-11-03: qty 2
  Filled 2021-11-03 (×5): qty 1

## 2021-11-03 MED ORDER — METOCLOPRAMIDE HCL 5 MG/ML IJ SOLN
10.0000 mg | Freq: Four times a day (QID) | INTRAMUSCULAR | Status: AC
Start: 2021-11-03 — End: 2021-11-04
  Administered 2021-11-03 – 2021-11-04 (×4): 10 mg via INTRAVENOUS
  Filled 2021-11-03 (×4): qty 2

## 2021-11-03 MED ORDER — PANTOPRAZOLE SODIUM 40 MG PO TBEC: 40.0000 mg | DELAYED_RELEASE_TABLET | Freq: Two times a day (BID) | ORAL | Status: DC

## 2021-11-03 MED ORDER — FUROSEMIDE 10 MG/ML IJ SOLN
80.0000 mg | Freq: Once | INTRAMUSCULAR | Status: AC
  Administered 2021-11-03: 80 mg via INTRAVENOUS
  Filled 2021-11-03: qty 8

## 2021-11-03 MED ORDER — DOCUSATE SODIUM 100 MG PO CAPS: 200.0000 mg | ORAL_CAPSULE | Freq: Two times a day (BID) | ORAL | Status: DC

## 2021-11-03 MED ORDER — LIDOCAINE 5 % EX PTCH
2.0000 | MEDICATED_PATCH | CUTANEOUS | Status: DC
Start: 2021-11-03 — End: 2021-11-18
  Administered 2021-11-03 – 2021-11-18 (×15): 2 via TRANSDERMAL
  Filled 2021-11-03 (×15): qty 2

## 2021-11-03 MED ORDER — PANTOPRAZOLE SODIUM 40 MG IV SOLR
40.0000 mg | Freq: Two times a day (BID) | INTRAVENOUS | Status: DC
  Administered 2021-11-03 – 2021-11-06 (×8): 40 mg via INTRAVENOUS
  Filled 2021-11-03 (×8): qty 10

## 2021-11-03 NOTE — Progress Notes (Signed)
[address]       [place] [address]             [phone]                 2 Days Post-Op Procedure(s) (LRB): MITRAL VALVE (MV) REPLACEMENT USING MITRIS VALVE. (N/A) CORONARY ARTERY BYPASS GRAFTING (CABG) X TWO USING OPEN LEFT INTERNAL MAMMARY ARTERY AND ENDOSOPIC RIGHT GREATER SAPHENOUS VEIN HARVEST. (N/A) MAZE (N/A) TRANSESOPHAGEAL ECHOCARDIOGRAM (TEE) (N/A) CLIPPING OF ATRIAL APPENDAGE USING AN ATRICLIP PRO2 (N/A)   Events: No events Complains of dysphagia, and nausea _______________________________________________________________ Vitals: BP 101/61   Pulse 80   Temp 99.1 F (37.3 C)   Resp 17   Ht 6\' 1"  (1.854 m)   Wt 91.4 kg   SpO2 100%   BMI 26.58 kg/m  Filed Weights   10/31/21 0222 11/01/21 0400 11/02/21 0500  Weight: 93.1 kg 93.2 kg 91.4 kg     - Neuro: alert NAD  - Cardiovascular: sinus with PVCs  Drips: milr 0.25, levo 20, epi 5.   PAP: (31-77)/(14-28) 42/19 CVP:  [6 mmHg-10 mmHg] 8 mmHg CO:  [3.8 L/min-4.7 L/min] 4.1 L/min CI:  [1.7 L/min/m2-2.1 L/min/m2] 1.9 L/min/m2  - Pulm: EWOB   ABG    Component Value Date/Time   PHART 7.300 (L) 11/02/2021 0151   PCO2ART 48.0 11/02/2021 0151   PO2ART 110 (H) 11/02/2021 0151   HCO3 23.7 11/02/2021 0151   TCO2 25 11/02/2021 0151   ACIDBASEDEF 3.0 (H) 11/02/2021 0151   O2SAT 67.9 11/03/2021 0358    - Abd: ND - Extremity: warm  .Intake/Output      05/27 0701 05/28 0700 05/28 0701 05/29 0700   P.O. 240    I.V. (mL/kg) 2527.6 (27.7)    Blood     NG/GT     IV Piggyback 300.1    Total Intake(mL/kg) 3067.6 (33.6)    Urine (mL/kg/hr) 1223 (0.6) 45 (0.1)   Emesis/NG output 60    Blood     Chest Tube 600 30   Total Output 1883 75   Net +1184.6 -75        Emesis Occurrence 1 x       _______________________________________________________________ Labs:    Latest Ref Rng & Units 11/03/2021    3:58 AM 11/02/2021    5:02 PM 11/02/2021    3:19 AM  CBC  WBC 4.0 -  10.5 K/uL 21.4   21.6   18.4    Hemoglobin 13.0 - 17.0 g/dL 9.2   9.7   11/04/2021    Hematocrit 39.0 - 52.0 % 27.1   27.7   29.9    Platelets 150 - 400 K/uL 120   122   155        Latest Ref Rng & Units 11/03/2021    3:58 AM 11/02/2021    5:02 PM 11/02/2021    3:19 AM  CMP  Glucose 70 - 99 mg/dL 11/04/2021   710   626    BUN 8 - 23 mg/dL 36   32   30    Creatinine 0.61 - 1.24 mg/dL 948   5.46   2.70    Sodium 135 - 145 mmol/L 131   131   133    Potassium 3.5 - 5.1 mmol/L 4.3   4.5   3.8    Chloride 98 - 111 mmol/L 95   96   97    CO2 22 - 32  mmol/L 27   25   25     Calcium 8.9 - 10.3 mg/dL 7.8   7.9   8.0      CXR: clear  _______________________________________________________________  Assessment and Plan: POD 2 s/p CABG, MVR, MAZE procedure  Neuro: pain controlled CV: weaning drips per HF Pulm: pulm hyg.  CT output remains high.   Renal: stable  Will continue to follw.  Diuresing.  Weight trending down GI: clears.  Speech consulted Heme: stable.  On coumadin.  INR 2.2 ID: [email or web address] Endo: SSI Dispo: continue ICU care   11/03/2021 10:57 AM

## 2021-11-03 NOTE — Progress Notes (Signed)
Advanced Heart Failure Rounding Note  PCP-Cardiologist: None   Subjective:    05/19: Admit with a/c CHF. CO-OX okay but appeared low-output >>started milrinone 0.25 05/22: Developed A flutter. Started on amio drip. Diuresd with IV lasix + metolazone 05/23: Chest pain. HS Trop 422>395. Started on imdur.  05/26: S/p CABG x2 (LIMA-LAD, SVG-RAMUS INT) +MVR, MAZE, LAA Clip   Sitting up in bed. Feels weak. Having trouble holding anything down. Chest sore.   Remains epi 5, NE 25 -> 20, milrinone 0.25. Co-ox 68% Scr stable 1.4 Hgb 10.4 -> 9.2  Swan #s CVP 8  PA 33/22 -> 42/19 Thermo 4.1/1.9   Objective:   Weight Range: 91.4 kg Body mass index is 26.58 kg/m.   Vital Signs:   Temp:  [98.6 F (37 C)-99.5 F (37.5 C)] 99.1 F (37.3 C) (05/28 0745) Pulse Rate:  [73-106] 80 (05/28 0745) Resp:  [8-34] 17 (05/28 0745) BP: (73-112)/(52-69) 101/61 (05/28 0700) SpO2:  [77 %-100 %] 100 % (05/28 0745) Arterial Line BP: (86-122)/(50-66) 117/54 (05/28 0745) Last BM Date : 10/29/21  Weight change: Filed Weights   10/31/21 0222 11/01/21 0400 11/02/21 0500  Weight: 93.1 kg 93.2 kg 91.4 kg    Intake/Output:   Intake/Output Summary (Last 24 hours) at 11/03/2021 1013 Last data filed at 11/03/2021 0800 Gross per 24 hour  Intake 2582.08 ml  Output 1748 ml  Net 834.08 ml       Physical Exam   General:  Sitting up in bed. Weak  No resp difficulty HEENT: normal Neck: supple. RIJ swan Carotids 2+ bilat; no bruits. No lymphadenopathy or thryomegaly appreciated. Cor: Dressing in place  Regular rate & rhythm. No rubs, gallops or murmurs. + CT Lungs: coarse Abdomen: soft, nontender, nondistended. No hepatosplenomegaly. No bruits or masses. Hypoactive bowel sounds. Extremities: no cyanosis, clubbing, rash, 1+ edema Neuro: alert & orientedx3, cranial nerves grossly intact. moves all 4 extremities w/o difficulty. Affect pleasant   Telemetry   A-V paced 80s, Personally  reviewed  Labs    CBC Recent Labs    11/02/21 1702 11/03/21 0358  WBC 21.6* 21.4*  HGB 9.7* 9.2*  HCT 27.7* 27.1*  MCV 86.0 86.9  PLT 122* 120*    Basic Metabolic Panel Recent Labs    11/02/21 0319 11/02/21 1702 11/03/21 0358  NA 133* 131* 131*  K 3.8 4.5 4.3  CL 97* 96* 95*  CO2 25 25 27   GLUCOSE 145* 185* 161*  BUN 30* 32* 36*  CREATININE 1.40* 1.40* 1.38*  CALCIUM 8.0* 7.9* 7.8*  MG 3.2* 2.6*  --     Liver Function Tests No results for input(s): AST, ALT, ALKPHOS, BILITOT, PROT, ALBUMIN in the last 72 hours. No results for input(s): LIPASE, AMYLASE in the last 72 hours. Cardiac Enzymes No results for input(s): CKTOTAL, CKMB, CKMBINDEX, TROPONINI in the last 72 hours.  BNP: BNP (last 3 results) Recent Labs    10/10/21 1606 10/22/21 1623 10/24/21 2150  BNP 1,564.6* 1,226.7* 1,306.1*     ProBNP (last 3 results) No results for input(s): PROBNP in the last 8760 hours.   D-Dimer No results for input(s): DDIMER in the last 72 hours. Hemoglobin A1C Recent Labs    11/01/21 0430  HGBA1C 6.0*    Fasting Lipid Panel No results for input(s): CHOL, HDL, LDLCALC, TRIG, CHOLHDL, LDLDIRECT in the last 72 hours. Thyroid Function Tests No results for input(s): TSH, T4TOTAL, T3FREE, THYROIDAB in the last 72 hours.  Invalid input(s): FREET3  Other results:  Imaging    DG Chest Port 1 View  Result Date: 11/03/2021 CLINICAL DATA:  Pneumothorax. EXAM: PORTABLE CHEST 1 VIEW COMPARISON:  One-view chest x-ray 11/01/2021. FINDINGS: Patient has been extubated. Swan-Ganz catheter terminates at the main pulmonary outflow tract. Bilateral chest tubes are in place. Both lungs have re-expanded. No residual pneumothorax present. Mediastinal drain and PICC line are stable. Heart is enlarged. Aeration of both lungs is improving. Small left pleural effusion remains. External pacing wires are stable. IMPRESSION: 1. Re-expansion of both lungs without residual pneumothorax.  2. Improving aeration of both lungs. 3. Small left pleural effusion. 4. Stable cardiomegaly. Electronically Signed   By: San Morelle M.D.   On: 11/03/2021 07:43     Medications:     Scheduled Medications:  acetaminophen  1,000 mg Oral Q6H   aspirin EC  325 mg Oral Daily   atorvastatin  80 mg Oral Daily   bisacodyl  10 mg Oral Daily   Or   bisacodyl  10 mg Rectal Daily   Chlorhexidine Gluconate Cloth  6 each Topical Q0600   docusate sodium  200 mg Oral Daily   enoxaparin (LOVENOX) injection  30 mg Subcutaneous QHS   escitalopram  10 mg Oral Daily   insulin aspart  0-24 Units Subcutaneous Q4H   latanoprost  1 drop Both Eyes QHS   lidocaine  2 patch Transdermal Q24H   mouth rinse  15 mL Mouth Rinse BID   metoCLOPramide (REGLAN) injection  10 mg Intravenous Q6H   pantoprazole  40 mg Oral BID AC   sodium chloride flush  10-40 mL Intracatheter Q12H   sodium chloride flush  3 mL Intravenous Q12H   Warfarin - Physician Dosing Inpatient   Does not apply q1600    Infusions:  sodium chloride 20 mL/hr at 11/03/21 0700   sodium chloride     sodium chloride     amiodarone 30 mg/hr (11/03/21 0700)   epinephrine 5 mcg/min (11/03/21 0700)   insulin Stopped (11/02/21 1333)   lactated ringers     lactated ringers 20 mL/hr at 11/03/21 0700   milrinone 0.25 mcg/kg/min (11/03/21 1004)   nitroGLYCERIN     norepinephrine (LEVOPHED) Adult infusion 20 mcg/min (11/03/21 0846)    PRN Medications: sodium chloride, dextrose, fentaNYL (SUBLIMAZE) injection, morphine injection, ondansetron (ZOFRAN) IV, oxyCODONE, sodium chloride flush, sodium chloride flush, traMADol    Patient Profile   76 y.o. male with hx COPD, prediabetes, HLD, GERD. Recent admit earlier this month with later presentation inferior STEMI and new systolic CHF w/ moderate to severe ischemic MR.   Readmitted with a/c CHF. Optimized from HF standpoint. Underwent CABG w MVR, MAZE and LAA clip 5/26.   Assessment/Plan    1. CAD/recent late presentation inferior STEMI - LHC 05/04: 100% m RCA and 90% m LAD.  - RCA managed medically d/t completed infarct.  - Echo (5/23) EF 25%, RV moderately reduced, moderate to severe MR - 5/26: S/p CABG x 2 (LIMA-LAD, SVG-Ramus INT)   - ASA + high intensity statin    2. Acute on chronic systolic CHF with low-output/ICM - s/p late presentation inferior STEMI 05/23 - Echo 05/23 EF 25-30%, RV moderately reduced, moderate to severe MR. - Limited echo on admit EF 30-35%, RV moderately reduced, MR not well evaluated, moderate pericardial effusion - S/p CABG + MVR 5/26 - on Milrinone 0.25, NE 20 + Epi 5. CI marginal at 1.9 - wean NE slowly. Give lasix today - follow swan hemodynamics and co-ox - Given  age, fraility and reduced LV function suspect may be slow inotrope wean.   3. Mitral regurgitation - Moderate to severe ischemic MR on TEE.  - S/p tissue MVR 11/02/21 - low-dose warfarin per TCTS   4. Aortic valve insufficiency - Mild to moderate AS/AI - stable.    5. Pericardial effusion/Acute Pericarditis  - Moderate in size on echo, no respiratory variation or RV collapse to suggest tamponade - acute pericarditis/ adhensions noted at time of CABG - c/w CTs   6. H/o AKI - Scr up slightly 1.10 > 1.4 from baseline but stable 1.39 today - Watch renal function closely    7. Prediabetes - A1c 5.8 1/23 - holding [place] for now   8. PAF - s/p MAZE w/ CABG  - continue amio gtt while on inotrope  - warfarin started  9. Hyponatremia  - Na 133 -> 131 today   10. Post-op ileus - also has h/o esophageal stricture (was unable to pass TEE probe transgastric prior to CABG) - continue Reglan and bowel regimen - mobilize as tolerated - can decompress with NGT as needed.  - will likely need Cor-trak later this week   CRITICAL CARE Performed by: Glori Bickers  Total critical care time:35  minutes  Critical care time was exclusive of separately billable  procedures and treating other patients.  Critical care was necessary to treat or prevent imminent or life-threatening deterioration.  Critical care was time spent personally by me (independent of midlevel providers or residents) on the following activities: development of treatment plan with patient and/or surrogate as well as nursing, discussions with consultants, evaluation of patient's response to treatment, examination of patient, obtaining history from patient or surrogate, ordering and performing treatments and interventions, ordering and review of laboratory studies, ordering and review of radiographic studies, pulse oximetry and re-evaluation of patient's condition.   Length of Stay: [place], [place]  11/03/2021, 10:13 AM  Advanced Heart Failure Team Pager [phone] (M-F; 7a - 5p)  Please contact [facility] for night-coverage after hours (5p -7a ) and weekends on [email or web address]
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
## 2021-11-04 ENCOUNTER — Inpatient Hospital Stay (HOSPITAL_COMMUNITY): Payer: Medicare HMO

## 2021-11-04 DIAGNOSIS — I5043 Acute on chronic combined systolic (congestive) and diastolic (congestive) heart failure: Secondary | ICD-10-CM | POA: Diagnosis not present

## 2021-11-04 LAB — COOXEMETRY PANEL
Carboxyhemoglobin: 1.6 % — ABNORMAL HIGH (ref 0.5–1.5)
Methemoglobin: <0.7 % (ref 0.0–1.5)
O2 Saturation: 74.9 %
Total hemoglobin: 7.9 g/dL — ABNORMAL LOW (ref 12.0–16.0)

## 2021-11-04 LAB — GLUCOSE, CAPILLARY
Glucose-Capillary: 121 mg/dL — ABNORMAL HIGH (ref 70–99)
Glucose-Capillary: 143 mg/dL — ABNORMAL HIGH (ref 70–99)
Glucose-Capillary: 147 mg/dL — ABNORMAL HIGH (ref 70–99)
Glucose-Capillary: 162 mg/dL — ABNORMAL HIGH (ref 70–99)
Glucose-Capillary: 177 mg/dL — ABNORMAL HIGH (ref 70–99)
Glucose-Capillary: 87 mg/dL (ref 70–99)

## 2021-11-04 LAB — MAGNESIUM: Magnesium: 2.5 mg/dL — ABNORMAL HIGH (ref 1.7–2.4)

## 2021-11-04 LAB — HEPATIC FUNCTION PANEL
ALT: 38 U/L (ref 0–44)
AST: 63 U/L — ABNORMAL HIGH (ref 15–41)
Albumin: 2.5 g/dL — ABNORMAL LOW (ref 3.5–5.0)
Alkaline Phosphatase: 89 U/L (ref 38–126)
Bilirubin, Direct: 0.2 mg/dL (ref 0.0–0.2)
Indirect Bilirubin: 0.6 mg/dL (ref 0.3–0.9)
Total Bilirubin: 0.8 mg/dL (ref 0.3–1.2)
Total Protein: 5.1 g/dL — ABNORMAL LOW (ref 6.5–8.1)

## 2021-11-04 LAB — BASIC METABOLIC PANEL
Anion gap: 8 (ref 5–15)
BUN: 40 mg/dL — ABNORMAL HIGH (ref 8–23)
CO2: 27 mmol/L (ref 22–32)
Calcium: 7.7 mg/dL — ABNORMAL LOW (ref 8.9–10.3)
Chloride: 95 mmol/L — ABNORMAL LOW (ref 98–111)
Creatinine, Ser: 1.29 mg/dL — ABNORMAL HIGH (ref 0.61–1.24)
GFR, Estimated: 58 mL/min — ABNORMAL LOW (ref >60)
Glucose, Bld: 139 mg/dL — ABNORMAL HIGH (ref 70–99)
Potassium: 4.2 mmol/L (ref 3.5–5.1)
Sodium: 130 mmol/L — ABNORMAL LOW (ref 135–145)

## 2021-11-04 LAB — PROTIME-INR
INR: 3.5 — ABNORMAL HIGH (ref 0.8–1.2)
Prothrombin Time: 34.7 seconds — ABNORMAL HIGH (ref 11.4–15.2)

## 2021-11-04 LAB — CBC
HCT: 24.4 % — ABNORMAL LOW (ref 39.0–52.0)
Hemoglobin: 8.5 g/dL — ABNORMAL LOW (ref 13.0–17.0)
MCH: 29.8 pg (ref 26.0–34.0)
MCHC: 34.8 g/dL (ref 30.0–36.0)
MCV: 85.6 fL (ref 80.0–100.0)
Platelets: 118 10*3/uL — ABNORMAL LOW (ref 150–400)
RBC: 2.85 MIL/uL — ABNORMAL LOW (ref 4.22–5.81)
RDW: 13.7 % (ref 11.5–15.5)
WBC: 19 10*3/uL — ABNORMAL HIGH (ref 4.0–10.5)
nRBC: 0.1 % (ref 0.0–0.2)

## 2021-11-04 MED ORDER — ASPIRIN 325 MG PO TABS
325.0000 mg | ORAL_TABLET | Freq: Every day | ORAL | Status: DC
  Administered 2021-11-04 – 2021-11-05 (×2): 325 mg via ORAL
  Filled 2021-11-04 (×2): qty 1

## 2021-11-04 MED ORDER — ACETAMINOPHEN 500 MG PO TABS
1000.0000 mg | ORAL_TABLET | Freq: Four times a day (QID) | ORAL | Status: AC
  Filled 2021-11-04: qty 2

## 2021-11-04 MED ORDER — ASPIRIN 325 MG PO TBEC
325.0000 mg | DELAYED_RELEASE_TABLET | Freq: Every day | ORAL | Status: DC
Start: 2021-11-04 — End: 2021-11-06

## 2021-11-04 MED ORDER — ACETAMINOPHEN 160 MG/5ML PO SOLN
1000.0000 mg | Freq: Four times a day (QID) | ORAL | Status: AC
  Administered 2021-11-04 (×2): 1000 mg via ORAL
  Filled 2021-11-04 (×3): qty 40.6

## 2021-11-04 MED ORDER — DOCUSATE SODIUM 50 MG/5ML PO LIQD
200.0000 mg | Freq: Two times a day (BID) | ORAL | Status: DC
  Administered 2021-11-04 – 2021-11-15 (×12): 200 mg via ORAL
  Filled 2021-11-04 (×18): qty 20

## 2021-11-04 MED ORDER — ORAL CARE MOUTH RINSE
15.0000 mL | Freq: Two times a day (BID) | OROMUCOSAL | Status: DC
  Administered 2021-11-04 – 2021-11-05 (×2): 15 mL via OROMUCOSAL

## 2021-11-04 MED ORDER — CHLORHEXIDINE GLUCONATE 0.12 % MT SOLN
15.0000 mL | Freq: Two times a day (BID) | OROMUCOSAL | Status: DC
Start: 2021-11-04 — End: 2021-11-05
  Administered 2021-11-05: 15 mL via OROMUCOSAL

## 2021-11-04 NOTE — Progress Notes (Signed)
EVENING ROUNDS NOTE :     [address]       [place] [address]             [phone]                 3 Days Post-Op Procedure(s) (LRB): MITRAL VALVE (MV) REPLACEMENT USING MITRIS VALVE. (N/A) CORONARY ARTERY BYPASS GRAFTING (CABG) X TWO USING OPEN LEFT INTERNAL MAMMARY ARTERY AND ENDOSOPIC RIGHT GREATER SAPHENOUS VEIN HARVEST. (N/A) MAZE (N/A) TRANSESOPHAGEAL ECHOCARDIOGRAM (TEE) (N/A) CLIPPING OF ATRIAL APPENDAGE USING AN ATRICLIP PRO2 (N/A)   Total Length of Stay:  LOS: 11 days  Events:   No events Levo weaned     BP (!) 88/52   Pulse 80   Temp 98.4 F (36.9 C)   Resp 19   Ht 6\' 1"  (1.854 m)   Wt 91.4 kg   SpO2 94%   BMI 26.58 kg/m   PAP: (31-51)/(8-27) 41/14 CVP:  [0 mmHg-9 mmHg] 9 mmHg PCWP:  [18 mmHg] 18 mmHg CO:  [4.3 L/min-5.1 L/min] 4.3 L/min CI:  [2 L/min/m2-2.4 L/min/m2] 2 L/min/m2      sodium chloride 20 mL/hr at 11/04/21 1800   sodium chloride     sodium chloride     amiodarone 30 mg/hr (11/04/21 1800)   epinephrine 5 mcg/min (11/04/21 1800)   insulin Stopped (11/02/21 1333)   lactated ringers     lactated ringers 20 mL/hr at 11/04/21 1800   milrinone 0.25 mcg/kg/min (11/04/21 1800)   nitroGLYCERIN     norepinephrine (LEVOPHED) Adult infusion 10 mcg/min (11/04/21 1800)    I/O last 3 completed shifts: In: 3266.8 [I.V.:3266.8] Out: 2625 [Urine:2285; Chest Tube:340]      Latest Ref Rng & Units 11/04/2021    3:26 AM 11/03/2021    3:58 AM 11/02/2021    5:02 PM  CBC  WBC 4.0 - 10.5 K/uL 19.0   21.4   21.6    Hemoglobin 13.0 - 17.0 g/dL 8.5   9.2   9.7    Hematocrit 39.0 - 52.0 % 24.4   27.1   27.7    Platelets 150 - 400 K/uL 118   120   122         Latest Ref Rng & Units 11/04/2021    3:26 AM 11/03/2021    3:58 AM 11/02/2021    5:02 PM  BMP  Glucose 70 - 99 mg/dL 11/04/2021   409   735    BUN 8 - 23 mg/dL 40   36   32    Creatinine 0.61 - 1.24 mg/dL 329   9.24   2.68    Sodium 135 - 145 mmol/L 130   131   131     Potassium 3.5 - 5.1 mmol/L 4.2   4.3   4.5    Chloride 98 - 111 mmol/L 95   95   96    CO2 22 - 32 mmol/L 27   27   25     Calcium 8.9 - 10.3 mg/dL 7.7   7.8   7.9      ABG    Component Value Date/Time   PHART 7.300 (L) 11/02/2021 0151   PCO2ART 48.0 11/02/2021 0151   PO2ART 110 (H) 11/02/2021 0151   HCO3 23.7 11/02/2021 0151   TCO2 25 11/02/2021 0151   ACIDBASEDEF 3.0 (H) 11/02/2021 0151   O2SAT 74.9 11/04/2021 0319       11/04/2021, MD 11/04/2021 8:35 PM

## 2021-11-04 NOTE — Progress Notes (Signed)
[address]       [place] [address]             [phone]                 3 Days Post-Op Procedure(s) (LRB): MITRAL VALVE (MV) REPLACEMENT USING MITRIS VALVE. (N/A) CORONARY ARTERY BYPASS GRAFTING (CABG) X TWO USING OPEN LEFT INTERNAL MAMMARY ARTERY AND ENDOSOPIC RIGHT GREATER SAPHENOUS VEIN HARVEST. (N/A) MAZE (N/A) TRANSESOPHAGEAL ECHOCARDIOGRAM (TEE) (N/A) CLIPPING OF ATRIAL APPENDAGE USING AN ATRICLIP PRO2 (N/A)   Events: No events  _______________________________________________________________ Vitals: BP 111/66   Pulse 80   Temp 99 F (37.2 C)   Resp 13   Ht 6\' 1"  (1.854 m)   Wt 91.4 kg   SpO2 100%   BMI 26.58 kg/m  Filed Weights   10/31/21 0222 11/01/21 0400 11/02/21 0500  Weight: 93.1 kg 93.2 kg 91.4 kg     - Neuro: alert NAD  - Cardiovascular: sinus with PVCs  Drips: milr 0.25, levo 15, epi 5.   PAP: (28-51)/(8-27) 37/13 CVP:  [0 mmHg-7 mmHg] 7 mmHg PCWP:  [18 mmHg] 18 mmHg CO:  [3.9 L/min-5.1 L/min] 4.3 L/min CI:  [1.8 L/min/m2-2.4 L/min/m2] 2 L/min/m2  - Pulm: EWOB   ABG    Component Value Date/Time   PHART 7.300 (L) 11/02/2021 0151   PCO2ART 48.0 11/02/2021 0151   PO2ART 110 (H) 11/02/2021 0151   HCO3 23.7 11/02/2021 0151   TCO2 25 11/02/2021 0151   ACIDBASEDEF 3.0 (H) 11/02/2021 0151   O2SAT 74.9 11/04/2021 0319    - Abd: ND - Extremity: warm  .Intake/Output      05/28 0701 05/29 0700 05/29 0701 05/30 0700   P.O.     I.V. (mL/kg) 2270.6 (24.8) 181.1 (2)   IV Piggyback     Total Intake(mL/kg) 2270.6 (24.8) 181.1 (2)   Urine (mL/kg/hr) 1660 (0.8) 150 (0.4)   Emesis/NG output     Chest Tube 220 0   Total Output 1880 150   Net +390.6 +31.1           _______________________________________________________________ Labs:    Latest Ref Rng & Units 11/04/2021    3:26 AM 11/03/2021    3:58 AM 11/02/2021    5:02 PM  CBC  WBC 4.0 - 10.5 K/uL 19.0   21.4   21.6    Hemoglobin 13.0 - 17.0 g/dL  8.5   9.2   9.7    Hematocrit 39.0 - 52.0 % 24.4   27.1   27.7    Platelets 150 - 400 K/uL 118   120   122        Latest Ref Rng & Units 11/04/2021    3:26 AM 11/03/2021    3:58 AM 11/02/2021    5:02 PM  CMP  Glucose 70 - 99 mg/dL 11/04/2021   993   716    BUN 8 - 23 mg/dL 40   36   32    Creatinine 0.61 - 1.24 mg/dL 967   8.93   8.10    Sodium 135 - 145 mmol/L 130   131   131    Potassium 3.5 - 5.1 mmol/L 4.2   4.3   4.5    Chloride 98 - 111 mmol/L 95   95   96    CO2 22 - 32 mmol/L 27   27   25     Calcium 8.9 - 10.3 mg/dL 7.7  7.8   7.9    Total Protein 6.5 - 8.1 g/dL 5.1      Total Bilirubin 0.3 - 1.2 mg/dL 0.8      Alkaline Phos 38 - 126 U/L 89      AST 15 - 41 U/L 63      ALT 0 - 44 U/L 38        CXR: clear  _______________________________________________________________  Assessment and Plan: POD 3 s/p CABG, MVR, MAZE procedure  Neuro: pain controlled CV: weaning drips per HF.  Will remove wires today Pulm: pulm hyg.  Will remove chest tube Renal: stable  Will continue to follw.  Diuresing.  Weight trending down GI: clears.  Speech consulted Heme: stable.  On coumadin.  INR 3.5 ID: [id number] Endo: SSI Dispo: continue ICU care   Keith Jenkins 11/04/2021 11:11 AM

## 2021-11-04 NOTE — Evaluation (Signed)
Physical Therapy Re-Evaluation Patient Details Name: Keith Jenkins MRN: [id number] DOB: January 12, 1946 Today's Date: 11/04/2021  History of Present Illness  76 yo admitted 5/18 with SOB and chest pain after recent STEMI.  Pt with CHF and Aflutter. S/p CABG and MVR 5/26.  PMHx:pre diabetes, HLD, GERD, arthritis, glaucoma   Clinical Impression  Pt now s/p CABG and MVR 5/26. Pt is displaying functional lower extremity weakness along with deficits in balance and activity tolerance. Pt required minAx2 to come to stand from bed in egress position and take steps to L bed > recliner using the [facility] walker. He needs cues to maintain sternal precautions throughout. I suspect as more lines/leads are removed and his pain improves he will progress well. Thus, continuing to recommend HHPT. Will continue to follow acutely.       Recommendations for follow up therapy are one component of a multi-disciplinary discharge planning process, led by the attending physician.  Recommendations may be updated based on patient status, additional functional criteria and insurance authorization.  Follow Up Recommendations Home health PT    Assistance Recommended at Discharge Intermittent Supervision/Assistance  Patient can return home with the following  A little help with walking and/or transfers;A little help with bathing/dressing/bathroom;Assistance with cooking/housework;Assist for transportation;Help with stairs or ramp for entrance    Equipment Recommendations Rolling walker (2 wheels)  Recommendations for Other Services       Functional Status Assessment Patient has had a recent decline in their functional status and demonstrates the ability to make significant improvements in function in a reasonable and predictable amount of time.     Precautions / Restrictions Precautions Precautions: Fall;Sternal Precaution Booklet Issued: No Precaution Comments: Gordy Councilman, a-line, x2 Y-chest tubes Restrictions Weight Bearing  Restrictions: Yes Other Position/Activity Restrictions: Sternal precautions      Mobility  Bed Mobility               General bed mobility comments: Use of egress at bed for supine to sit due to lines    Transfers Overall transfer level: Needs assistance Equipment used: 2 person hand held assist Transfers: Sit to/from Stand, Bed to chair/wheelchair/BSC Sit to Stand: Min assist, +2 physical assistance, +2 safety/equipment   Step pivot transfers: Min assist, +2 physical assistance, +2 safety/equipment       General transfer comment: Min A +2 for slight power up and weight shift forward to stand from sitting position with bed in egress. MinA +2 for steadying pt with stand step to L bed > recliner using Harmon Pier walker, RNs assisting with lines.    Ambulation/Gait Ambulation/Gait assistance: Min assist, +2 physical assistance, +2 safety/equipment Gait Distance (Feet): 4 Feet Assistive device: Ethelene Hal Gait Pattern/deviations: Decreased stride length, Trunk flexed Gait velocity: reduced Gait velocity interpretation: <1.31 ft/sec, indicative of household ambulator   General Gait Details: Pt with flexed trunk and slight knee instability noted, minAx2 to steady pt with lateral steps to L bed > recliner using Harmon Pier walker, RNs assisting with lines.  Stairs            Wheelchair Mobility    Modified Rankin (Stroke Patients Only)       Balance Overall balance assessment: Needs assistance Sitting-balance support: No upper extremity supported, Feet supported Sitting balance-Leahy Scale: Fair     Standing balance support: Bilateral upper extremity supported, During functional activity Standing balance-Leahy Scale: Poor Standing balance comment: Requiring physical A for stand balance  Pertinent Vitals/Pain Pain Assessment Pain Assessment: Faces Faces Pain Scale: Hurts a little bit Pain Location: Sternum Pain Descriptors /  Indicators: Discomfort Pain Intervention(s): Monitored during session, Limited activity within patient's tolerance, Repositioned    Home Living Family/patient expects to be discharged to:: Private residence Living Arrangements: Spouse/significant other Available Help at Discharge: Family;Available 24 hours/day Type of Home: House Home Access: Stairs to enter Entrance Stairs-Rails: None Entrance Stairs-Number of Steps: 3 Alternate Level Stairs-Number of Steps: 14 Home Layout: Two level;Able to live on main level with bedroom/bathroom Home Equipment: Kasandra Knudsen - single point Additional Comments: pt against using a RW    Prior Function Prior Level of Function : Independent/Modified Independent             Mobility Comments: SPC recently ADLs Comments: Performs ADLs and IADLs     Hand Dominance   Dominant Hand: Right    Extremity/Trunk Assessment   Upper Extremity Assessment Upper Extremity Assessment: Defer to OT evaluation    Lower Extremity Assessment Lower Extremity Assessment: Generalized weakness (noted functionally)    Cervical / Trunk Assessment Cervical / Trunk Assessment: Kyphotic (mild)  Communication   Communication: No difficulties  Cognition Arousal/Alertness: Awake/alert Behavior During Therapy: WFL for tasks assessed/performed Overall Cognitive Status: Within Functional Limits for tasks assessed                                          General Comments General comments (skin integrity, edema, etc.): SpO2 94% on RA. HR 90-110s. BP stable. RN present throughout.    Exercises     Assessment/Plan    PT Assessment Patient needs continued PT services  PT Problem List Decreased strength;Decreased mobility;Decreased activity tolerance;Decreased balance;Cardiopulmonary status limiting activity;Decreased knowledge of precautions;Pain       PT Treatment Interventions DME instruction;Gait training;Functional mobility training;Therapeutic  activities;Therapeutic exercise;Balance training;Neuromuscular re-education;Patient/family education;Stair training    PT Goals (Current goals can be found in the Care Plan section)  Acute Rehab PT Goals Patient Stated Goal: to improve PT Goal Formulation: With patient Time For Goal Achievement: 11/18/21 Potential to Achieve Goals: Good    Frequency Min 3X/week     Co-evaluation PT/OT/SLP Co-Evaluation/Treatment: Yes Reason for Co-Treatment: For patient/therapist safety;To address functional/ADL transfers PT goals addressed during session: Mobility/safety with mobility;Balance;Proper use of DME OT goals addressed during session: ADL's and self-care       AM-PAC PT "6 Clicks" Mobility  Outcome Measure Help needed turning from your back to your side while in a flat bed without using bedrails?: A Lot Help needed moving from lying on your back to sitting on the side of a flat bed without using bedrails?: A Lot Help needed moving to and from a bed to a chair (including a wheelchair)?: A Lot Help needed standing up from a chair using your arms (e.g., wheelchair or bedside chair)?: A Lot Help needed to walk in hospital room?: Total Help needed climbing 3-5 steps with a railing? : Total 6 Click Score: 10    End of Session   Activity Tolerance: Patient tolerated treatment well Patient left: in chair;with call bell/phone within reach;with nursing/sitter in room Nurse Communication: Mobility status PT Visit Diagnosis: Other abnormalities of gait and mobility (R26.89);Muscle weakness (generalized) (M62.81);Unsteadiness on feet (R26.81);Difficulty in walking, not elsewhere classified (R26.2);Pain Pain - part of body:  (sternum)    Time: [id number] PT Time Calculation (min) (ACUTE ONLY): 25 min  Charges:   PT Evaluation $PT Re-evaluation: 1 Re-eval          Moishe Spice, PT, DPT Acute Rehabilitation Services  Pager: [phone] Office: [place] 11/04/2021, 3:51 PM

## 2021-11-04 NOTE — Progress Notes (Signed)
Advanced Heart Failure Rounding Note  PCP-Cardiologist: None   Subjective:    05/19: Admit with a/c CHF. CO-OX okay but appeared low-output >>started milrinone 0.25 05/22: Developed A flutter. Started on amio drip. Diuresd with IV lasix + metolazone 05/23: Chest pain. HS Trop 422>395. Started on imdur.  05/26: S/p CABG x2 (LIMA-LAD, SVG-RAMUS INT) +MVR, MAZE, LAA Clip   Sitting up in bed. Feels weak. Unable to hold anything down. Denies SOB   Remains epi 5, NE 20 -> 14, milrinone 0.25. Co-ox 75% Scr stable 1.3 Hgb 9.2 -> 8.5  Got lasix 80 IV yesterday. About 2L out  INR 3.5   Swan #s CVP 6 PA 40/15 PCWP 18 SVR 1200 Thermo 4.6/2.0   Objective:   Weight Range: 91.4 kg Body mass index is 26.58 kg/m.   Vital Signs:   Temp:  [98.8 F (37.1 C)-99.9 F (37.7 C)] 99 F (37.2 C) (05/29 0700) Pulse Rate:  [75-91] 79 (05/29 0700) Resp:  [9-31] 14 (05/29 0700) BP: (82-113)/(50-76) 108/61 (05/29 0600) SpO2:  [90 %-100 %] 100 % (05/29 0700) Arterial Line BP: (68-132)/(41-71) 126/55 (05/29 0700) Last BM Date : 10/29/21  Weight change: Filed Weights   10/31/21 0222 11/01/21 0400 11/02/21 0500  Weight: 93.1 kg 93.2 kg 91.4 kg    Intake/Output:   Intake/Output Summary (Last 24 hours) at 11/04/2021 0827 Last data filed at 11/04/2021 0700 Gross per 24 hour  Intake 2270.6 ml  Output 1805 ml  Net 465.6 ml       Physical Exam   General:  Sitting up in bed  HEENT: normal Neck: supple. RIJ Swan. Carotids 2+ bilat; no bruits. No lymphadenopathy or thryomegaly appreciated. Cor: Sternal wound ok Regular rate & rhythm. +CTs Lungs: clear Abdomen: soft, nontender, nondistended No hepatosplenomegaly. No bruits or masses. Hypoactive bowel sounds. Extremities: no cyanosis, clubbing, rash, tr edema Neuro: alert & orientedx3, cranial nerves grossly intact. moves all 4 extremities w/o difficulty. Affect peasant   Telemetry   Sinus/ectopic atrial rhythm 70-80s Personally  reviewed   Labs    CBC Recent Labs    11/03/21 0358 11/04/21 0326  WBC 21.4* 19.0*  HGB 9.2* 8.5*  HCT 27.1* 24.4*  MCV 86.9 85.6  PLT 120* 118*    Basic Metabolic Panel Recent Labs    11/02/21 1702 11/03/21 0358 11/04/21 0326  NA 131* 131* 130*  K 4.5 4.3 4.2  CL 96* 95* 95*  CO2 25 27 27   GLUCOSE 185* 161* 139*  BUN 32* 36* 40*  CREATININE 1.40* 1.38* 1.29*  CALCIUM 7.9* 7.8* 7.7*  MG 2.6*  --  2.5*    Liver Function Tests Recent Labs    11/04/21 0326  AST 63*  ALT 38  ALKPHOS 89  BILITOT 0.8  PROT 5.1*  ALBUMIN 2.5*   No results for input(s): LIPASE, AMYLASE in the last 72 hours. Cardiac Enzymes No results for input(s): CKTOTAL, CKMB, CKMBINDEX, TROPONINI in the last 72 hours.  BNP: BNP (last 3 results) Recent Labs    10/10/21 1606 10/22/21 1623 10/24/21 2150  BNP 1,564.6* 1,226.7* 1,306.1*     ProBNP (last 3 results) No results for input(s): PROBNP in the last 8760 hours.   D-Dimer No results for input(s): DDIMER in the last 72 hours. Hemoglobin A1C No results for input(s): HGBA1C in the last 72 hours.  Fasting Lipid Panel No results for input(s): CHOL, HDL, LDLCALC, TRIG, CHOLHDL, LDLDIRECT in the last 72 hours. Thyroid Function Tests No results for input(s): TSH,  T4TOTAL, T3FREE, THYROIDAB in the last 72 hours.  Invalid input(s): FREET3  Other results:   Imaging    DG CHEST PORT 1 VIEW  Result Date: 11/04/2021 CLINICAL DATA:  CABG EXAM: PORTABLE CHEST 1 VIEW COMPARISON:  Prior chest x-ray yesterday FINDINGS: A right IJ vascular sheath conveys a Swan-Ganz catheter into the heart. The tip of the Swan overlies the main pulmonary outflow tract. Evidence of prior median sternotomy with aortic valve replacement, multivessel CABG and left atrial ligation. Mediastinal and bilateral chest tubes in place. No pneumothorax. No evidence of pulmonary edema. Mild vascular congestion. Trace streaky bibasilar airspace opacities likely  reflecting atelectasis. Atherosclerotic calcifications present in the transverse aorta. IMPRESSION: 1. Stable and satisfactory support apparatus. 2. Stable cardiomegaly with vascular congestion but no edema and mild bibasilar atelectasis. Electronically Signed   By: Jacqulynn Cadet M.D.   On: 11/04/2021 07:27     Medications:     Scheduled Medications:  acetaminophen  1,000 mg Oral Q6H   aspirin EC  325 mg Oral Daily   atorvastatin  80 mg Oral Daily   bisacodyl  10 mg Oral Daily   Or   bisacodyl  10 mg Rectal Daily   Chlorhexidine Gluconate Cloth  6 each Topical Q0600   docusate sodium  200 mg Oral BID   escitalopram  10 mg Oral Daily   insulin aspart  0-24 Units Subcutaneous Q4H   latanoprost  1 drop Both Eyes QHS   lidocaine  2 patch Transdermal Q24H   mouth rinse  15 mL Mouth Rinse BID   pantoprazole (PROTONIX) IV  40 mg Intravenous Q12H   sodium chloride flush  10-40 mL Intracatheter Q12H   sodium chloride flush  3 mL Intravenous Q12H   Warfarin - Physician Dosing Inpatient   Does not apply q1600    Infusions:  sodium chloride 20 mL/hr at 11/04/21 0700   sodium chloride     sodium chloride     amiodarone 30 mg/hr (11/04/21 0700)   epinephrine 5 mcg/min (11/04/21 0700)   insulin Stopped (11/02/21 1333)   lactated ringers     lactated ringers 20 mL/hr at 11/04/21 0700   milrinone 0.25 mcg/kg/min (11/04/21 0700)   nitroGLYCERIN     norepinephrine (LEVOPHED) Adult infusion 14 mcg/min (11/04/21 0700)    PRN Medications: sodium chloride, dextrose, fentaNYL (SUBLIMAZE) injection, morphine injection, ondansetron (ZOFRAN) IV, oxyCODONE, sodium chloride flush, sodium chloride flush, traMADol    Patient Profile   76 y.o. male with hx COPD, prediabetes, HLD, GERD. Recent admit earlier this month with later presentation inferior STEMI and new systolic CHF w/ moderate to severe ischemic MR.   Readmitted with a/c CHF. Optimized from HF standpoint. Underwent CABG w MVR, MAZE  and LAA clip 5/26.   Assessment/Plan   1. CAD/recent late presentation inferior STEMI - LHC 05/04: 100% m RCA and 90% m LAD.  - RCA managed medically d/t completed infarct.  - Echo (5/23) EF 25%, RV moderately reduced, moderate to severe MR - 5/26: S/p CABG x 2 (LIMA-LAD, SVG-Ramus INT)   - ASA + high intensity statin  - No s/s angina   2. Acute on chronic systolic CHF with low-output/ICM - s/p late presentation inferior STEMI 05/23 - Echo 05/23 EF 25-30%, RV moderately reduced, moderate to severe MR. - Limited echo on admit EF 30-35%, RV moderately reduced, MR not well evaluated, moderate pericardial effusion - S/p CABG + MVR 5/26 - on Milrinone 0.25, NE 20 -> 14 + Epi 5. CI  2.2 - Continue to wean NE slowly. - Volume status ok. No lasix today - Follow swan hemodynamics and co-ox. Hopefully can get swan out tomorrow - Given age, fraility and reduced LV function suspect may be slow inotrope wean.   3. Mitral regurgitation - Moderate to severe ischemic MR on TEE.  - S/p tissue MVR 11/02/21 - On low-dose warfarin per TCTS. INR 3.5 today. Hold warfarin. LFTs ok    4. Aortic valve insufficiency - Mild to moderate AS/AI - stable.    5. Pericardial effusion/Acute Pericarditis  - Moderate in size on echo, no respiratory variation or RV collapse to suggest tamponade - acute pericarditis/ adhensions noted at time of CABG - c/w CTs   6. H/o AKI - Scr up slightly 1.10 > 1.4 from baseline but stable 1.29 today - Watch renal function closely    7. Prediabetes - A1c 5.8 1/23 - holding [place] for now   8. PAF - s/p MAZE w/ CABG  - continue amio gtt while on inotrope  - warfarin started  9. Hyponatremia  - Na 133 -> 131 -> 130  today   10. Post-op ileus - also has h/o esophageal stricture (was unable to pass TEE probe transgastric prior to CABG) - continue Reglan and bowel regimen - mobilize as tolerated - can decompress with NGT as needed.  - will likely need Cor-trak  tomorrow   CRITICAL CARE Performed by: Glori Bickers  Total critical care time:45  minutes  Critical care time was exclusive of separately billable procedures and treating other patients.  Critical care was necessary to treat or prevent imminent or life-threatening deterioration.  Critical care was time spent personally by me (independent of midlevel providers or residents) on the following activities: development of treatment plan with patient and/or surrogate as well as nursing, discussions with consultants, evaluation of patient's response to treatment, examination of patient, obtaining history from patient or surrogate, ordering and performing treatments and interventions, ordering and review of laboratory studies, ordering and review of radiographic studies, pulse oximetry and re-evaluation of patient's condition.   Length of Stay: 37  Glori Bickers, MD  11/04/2021, 8:27 AM  Advanced Heart Failure Team Pager [phone] (M-F; 7a - 5p)  Please contact [facility] for night-coverage after hours (5p -7a ) and weekends on [email or web address]

## 2021-11-04 NOTE — Progress Notes (Signed)
Occupational Therapy Re-evaluation Patient Details Name: Keith Jenkins MRN: [id number] DOB: [birth year] Today's Date: 11/04/2021   History of present illness 76 yo admitted 5/18 with SOB and chest pain after recent STEMI.  Pt with CHF and Aflutter. S/p CABG and MVR 5/26.  PMHx:pre diabetes, HLD, GERD, arthritis, glaucoma   OT comments  Pt revaluated s/p CABG. Pt currently requiring Max A for bathing, dressing, and toileting and Min A +2 for functional mobility with eva walker. Pt presenting with decreased ROM, balance, strength, and activity tolerance. Pt continued to present with high motivation and agreeable to participate in therapy. All vitals stable and RN present throughout. Continue to recommend dc to home once medically stable. Will continue to follow acutely as admitted to facilitate safe dc to home.    Recommendations for follow up therapy are one component of a multi-disciplinary discharge planning process, led by the attending physician.  Recommendations may be updated based on patient status, additional functional criteria and insurance authorization.    Follow Up Recommendations  No OT follow up    Assistance Recommended at Discharge Frequent or constant Supervision/Assistance  Patient can return home with the following  A little help with walking and/or transfers;A little help with bathing/dressing/bathroom;Assistance with cooking/housework;Assist for transportation   Equipment Recommendations  None recommended by OT    Recommendations for Other Services      Precautions / Restrictions Precautions Precautions: Fall Precaution Comments: Yevonne Aline, a-line Restrictions Other Position/Activity Restrictions: Sternal precautions       Mobility Bed Mobility               General bed mobility comments: Use of egress at bed for supine to sit due to lines    Transfers Overall transfer level: Needs assistance Equipment used: 2 person hand held assist Transfers: Sit  to/from Stand Sit to Stand: Min assist, +2 physical assistance           General transfer comment: Min A +2 for slight power up and weight shift forward     Balance Overall balance assessment: Needs assistance Sitting-balance support: No upper extremity supported, Feet supported Sitting balance-Leahy Scale: Fair     Standing balance support: Bilateral upper extremity supported, During functional activity Standing balance-Leahy Scale: Poor Standing balance comment: Requiring physical A for stand balance                           ADL either performed or assessed with clinical judgement   ADL Overall ADL's : Needs assistance/impaired                         Toilet Transfer: Minimal assistance;+2 for physical assistance (simulated to recliner) Toilet Transfer Details (indicate cue type and reason): Min A for balance with use of eva walker to take side steps to recliner. Cues for hands to knees during sit<>stand Toileting- Clothing Manipulation and Hygiene: Maximal assistance;+2 for physical assistance;Sit to/from stand Toileting - Clothing Manipulation Details (indicate cue type and reason): Max A for posterior peri care by nurse with therapist for satnding balance     Functional mobility during ADLs: Minimal assistance;+2 for physical assistance General ADL Comments: Will require education on compensatory techniques for bathing, dressing, and toileting.    Extremity/Trunk Assessment Upper Extremity Assessment Upper Extremity Assessment: Overall WFL for tasks assessed   Lower Extremity Assessment Lower Extremity Assessment: Defer to PT evaluation        Vision  Vision Assessment?: No apparent visual deficits   Perception     Praxis      Cognition Arousal/Alertness: Awake/alert Behavior During Therapy: WFL for tasks assessed/performed Overall Cognitive Status: Within Functional Limits for tasks assessed                                           Exercises      Shoulder Instructions       General Comments SpO2 94% on RA. HR 90-110s. BP stable. RN present throughout.    Pertinent Vitals/ Pain       Pain Assessment Pain Assessment: Faces Faces Pain Scale: Hurts a little bit Pain Location: Sternum Pain Descriptors / Indicators: Discomfort Pain Intervention(s): Monitored during session, Repositioned  Home Living                                          Prior Functioning/Environment              Frequency  Min 2X/week        Progress Toward Goals  OT Goals(current goals can now be found in the care plan section)  Progress towards OT goals: Progressing toward goals  Acute Rehab OT Goals OT Goal Formulation: With patient/family Time For Goal Achievement: 11/14/21 Potential to Achieve Goals: Good ADL Goals Pt Will Perform Upper Body Dressing: with set-up;with supervision;sitting Pt Will Perform Lower Body Dressing: with min guard assist;sit to/from stand Pt Will Transfer to Toilet: with set-up;with supervision;ambulating;regular height toilet Pt Will Perform Toileting - Clothing Manipulation and hygiene: with set-up;with supervision;sitting/lateral leans;sit to/from stand  Plan Discharge plan remains appropriate    Co-evaluation    PT/OT/SLP Co-Evaluation/Treatment: Yes Reason for Co-Treatment: For patient/therapist safety;To address functional/ADL transfers   OT goals addressed during session: ADL's and self-care      AM-PAC OT "6 Clicks" Daily Activity     Outcome Measure   Help from another person eating meals?: None Help from another person taking care of personal grooming?: A Little Help from another person toileting, which includes using toliet, bedpan, or urinal?: A Lot Help from another person bathing (including washing, rinsing, drying)?: A Lot Help from another person to put on and taking off regular upper body clothing?: A Lot Help from another person to  put on and taking off regular lower body clothing?: A Lot 6 Click Score: 15    End of Session Equipment Utilized During Treatment: Other (comment) Carley Hammed walker)  OT Visit Diagnosis: Unsteadiness on feet (R26.81);Other abnormalities of gait and mobility (R26.89);Muscle weakness (generalized) (M62.81)   Activity Tolerance Patient tolerated treatment well   Patient Left in chair;with call bell/phone within reach;with nursing/sitter in room   Nurse Communication Mobility status        Time: 1359-1415 OT Time Calculation (min): 16 min  Charges: OT General Charges $OT Visit: 1 Visit OT Evaluation $OT Re-eval: 1 Re-eval  Irisa Grimsley MSOT, OTR/L Acute Rehab Office: [phone]  Theodoro Grist Kiva Norland 11/04/2021, 2:39 PM

## 2021-11-05 ENCOUNTER — Encounter (HOSPITAL_COMMUNITY): Payer: Self-pay | Admitting: Surgery

## 2021-11-05 DIAGNOSIS — I5043 Acute on chronic combined systolic (congestive) and diastolic (congestive) heart failure: Secondary | ICD-10-CM | POA: Diagnosis not present

## 2021-11-05 LAB — BASIC METABOLIC PANEL
Anion gap: 8 (ref 5–15)
BUN: 44 mg/dL — ABNORMAL HIGH (ref 8–23)
CO2: 25 mmol/L (ref 22–32)
Calcium: 7.6 mg/dL — ABNORMAL LOW (ref 8.9–10.3)
Chloride: 95 mmol/L — ABNORMAL LOW (ref 98–111)
Creatinine, Ser: 1.16 mg/dL (ref 0.61–1.24)
GFR, Estimated: >60 mL/min (ref >60)
Glucose, Bld: 141 mg/dL — ABNORMAL HIGH (ref 70–99)
Potassium: 3.8 mmol/L (ref 3.5–5.1)
Sodium: 128 mmol/L — ABNORMAL LOW (ref 135–145)

## 2021-11-05 LAB — GLUCOSE, CAPILLARY
Glucose-Capillary: 135 mg/dL — ABNORMAL HIGH (ref 70–99)
Glucose-Capillary: 164 mg/dL — ABNORMAL HIGH (ref 70–99)
Glucose-Capillary: 174 mg/dL — ABNORMAL HIGH (ref 70–99)
Glucose-Capillary: 163 mg/dL — ABNORMAL HIGH (ref 70–99)
Glucose-Capillary: 166 mg/dL — ABNORMAL HIGH (ref 70–99)

## 2021-11-05 LAB — CBC
HCT: 22.8 % — ABNORMAL LOW (ref 39.0–52.0)
HCT: 23.6 % — ABNORMAL LOW (ref 39.0–52.0)
Hemoglobin: 7.9 g/dL — ABNORMAL LOW (ref 13.0–17.0)
Hemoglobin: 8.2 g/dL — ABNORMAL LOW (ref 13.0–17.0)
MCH: 29.8 pg (ref 26.0–34.0)
MCH: 29.8 pg (ref 26.0–34.0)
MCHC: 34.6 g/dL (ref 30.0–36.0)
MCHC: 34.7 g/dL (ref 30.0–36.0)
MCV: 86 fL (ref 80.0–100.0)
MCV: 85.8 fL (ref 80.0–100.0)
Platelets: 122 10*3/uL — ABNORMAL LOW (ref 150–400)
Platelets: 133 10*3/uL — ABNORMAL LOW (ref 150–400)
RBC: 2.65 MIL/uL — ABNORMAL LOW (ref 4.22–5.81)
RBC: 2.75 MIL/uL — ABNORMAL LOW (ref 4.22–5.81)
RDW: 13.8 % (ref 11.5–15.5)
RDW: 13.9 % (ref 11.5–15.5)
WBC: 15.2 10*3/uL — ABNORMAL HIGH (ref 4.0–10.5)
WBC: 13.7 10*3/uL — ABNORMAL HIGH (ref 4.0–10.5)
nRBC: 0.3 % — ABNORMAL HIGH (ref 0.0–0.2)
nRBC: 0.9 % — ABNORMAL HIGH (ref 0.0–0.2)

## 2021-11-05 LAB — COOXEMETRY PANEL
Carboxyhemoglobin: 1 % (ref 0.5–1.5)
Methemoglobin: 0.8 % (ref 0.0–1.5)
O2 Saturation: 60.9 %
Total hemoglobin: 8.3 g/dL — ABNORMAL LOW (ref 12.0–16.0)

## 2021-11-05 LAB — PROTIME-INR
INR: 4.6 (ref 0.8–1.2)
Prothrombin Time: 43.5 s — ABNORMAL HIGH (ref 11.4–15.2)

## 2021-11-05 LAB — ECHO TEE: Radius: 0.7 cm

## 2021-11-05 MED ORDER — FUROSEMIDE 10 MG/ML IJ SOLN
40.0000 mg | Freq: Two times a day (BID) | INTRAMUSCULAR | Status: AC
  Administered 2021-11-05 (×2): 40 mg via INTRAVENOUS
  Filled 2021-11-05 (×2): qty 4

## 2021-11-05 MED ORDER — ADULT MULTIVITAMIN LIQUID CH
15.0000 mL | Freq: Every day | ORAL | Status: DC
  Administered 2021-11-05 – 2021-11-18 (×14): 15 mL via ORAL
  Filled 2021-11-05 (×14): qty 15

## 2021-11-05 MED ORDER — POTASSIUM CHLORIDE 10 MEQ/50ML IV SOLN
10.0000 meq | INTRAVENOUS | Status: AC
  Administered 2021-11-05 (×4): 10 meq via INTRAVENOUS
  Filled 2021-11-05 (×3): qty 50

## 2021-11-05 MED ORDER — ENSURE ENLIVE PO LIQD
237.0000 mL | Freq: Three times a day (TID) | ORAL | Status: DC
  Administered 2021-11-05 – 2021-11-18 (×42): 237 mL via ORAL

## 2021-11-05 MED ORDER — ENSURE ENLIVE PO LIQD
237.0000 mL | Freq: Three times a day (TID) | ORAL | Status: DC
  Administered 2021-11-05: 237 mL via ORAL

## 2021-11-05 MED ORDER — SORBITOL 70 % SOLN
30.0000 mL | Freq: Once | Status: AC
  Administered 2021-11-05: 30 mL via ORAL
  Filled 2021-11-05: qty 30

## 2021-11-05 MED FILL — Lidocaine HCl Local Preservative Free (PF) Inj 2%: INTRAMUSCULAR | Qty: 15 | Status: AC

## 2021-11-05 MED FILL — Heparin Sodium (Porcine) Inj 1000 Unit/ML: Qty: 1000 | Status: AC

## 2021-11-05 MED FILL — Potassium Chloride Inj 2 mEq/ML: INTRAVENOUS | Qty: 40 | Status: AC

## 2021-11-05 MED FILL — Thrombin (Recombinant) For Soln 20000 Unit: CUTANEOUS | Qty: 1 | Status: AC

## 2021-11-05 NOTE — Progress Notes (Incomplete)
ADVANCED HF CLINIC CONSULT NOTE   Primary Care: [name], NP HF Cardiologist: Dr. Gala Romney  HPI: Keith Jenkins is a 76 y.o. male with history of COPD, prediabetes, GERD/dysphagia, HLD, and new diagnosis of CAD and systolic heart failure.  Admitted 4/23 with chest pain. Ruled in for inferior STEMI, late presenting. LHC showed total occlusion mRCA and 90% mLAD. RCA managed medically d/t completed infarct.  Plan to optimize medical therapy and arrange LAD intervention at later time. Echo showed EF 20-255%, RWMA, RV moderately reduced, mioderate to severe ischemic MR, aortic root 4.3 cm, dilated IVC with estimated RAP 8 mmHg. AHF consulted and GDMT titrated, but limited due to soft blood pressure. CVTS consulted for CABG/MVR, instead of PCI to LAD, due to moderate to severe ischemic MR. Plan to optimize medically and improve strength, then arrange surgery in a few weeks. Discharge home, weight 217 lbs.   Today he returns for HF follow up with his wife and daughter. Overall feeling fine but remains weak. Walking daily 10 minutes at a time up and down hills without SOB. He has new orthopnea. Denies palpitations, CP, dizziness, edema, or abnormal bleeding. Appetite ok. No fever or chills. Weight at home 211 pounds. Taking all medications. Has not need PRN lasix. He has a productive cough since hospitalization, daughter says he is depressed. Lives in [place].  Cardiac Studies: - Echo (4/23): EF 20-255%, RWMA, RV moderately reduced, mioderate to severe ischemic MR, aortic root 4.3 cm, dilated IVC with estimated RAP 8 mmHg  - LHC (5/23): 100% m RCA and 90% m LAD. RCA managed medically d/t completed infarct.   Review of Systems: [y] = yes, [ ]  = no   General: Weight gain [ ] ; Weight loss [ ] ; Anorexia [ ] ; Fatigue [ y]; Fever [ ] ; Chills [ ] ; Weakness ]  Cardiac: Chest pain/pressure [ ] ; Resting SOB [ ] ; Exertional SOB [ ] ; Orthopnea [ ] ; Pedal Edema [ ] ; Palpitations [ ] ;  Syncope [ ] ; Presyncope [ ] ; Paroxysmal nocturnal dyspnea[ ]   Pulmonary: Cough [ ] ; Wheezing[ ] ; Hemoptysis[ ] ; Sputum [ ] ; Snoring ]  GI: Vomiting[ ] ; Dysphagia[ ] ; Melena[ ] ; Hematochezia [ ] ; Heartburn[ ] ; Abdominal pain [ ] ; Constipation [ ] ; Diarrhea [ ] ; BRBPR [ ]   GU: Hematuria[ ] ; Dysuria [ ] ; Nocturia[ ]   Vascular: Pain in legs with walking [ ] ; Pain in feet with lying flat [ ] ; Non-healing sores [ ] ; Stroke [ ] ; TIA [ ] ; Slurred speech [ ] ;  Neuro: Headaches[ ] ; Vertigo[ ] ; Seizures[ ] ; Paresthesias[ ] ;Blurred vision [ ] ; Diplopia [ ] ; Vision changes [ ]   Ortho/Skin: Arthritis [ ] ; Joint pain [ ] ; Muscle pain [ ] ; Joint swelling [ ] ; Back Pain [ ] ; Rash [ ]   Psych: Depression[ ] ; Anxiety[ ]   Heme: Bleeding problems [ ] ; Clotting disorders [ ] ; Anemia [ ]   Endocrine: Diabetes [ ] ; Thyroid dysfunction[ ]   Past Medical History:  Diagnosis Date   Arthritis    Glaucoma    No current facility-administered medications for this visit.   No current outpatient medications on file.   Facility-Administered Medications Ordered in Other Visits  Medication Dose Route Frequency Provider Last Rate Last Admin   0.45 % sodium chloride infusion   Intravenous Continuous PRN , PA-C 10 mL/hr at 11/05/21 0700 Infusion Verify at 11/05/21 0700   0.9 %  sodium chloride infusion  250 mL Intravenous Continuous , PA-C  0.9 %  sodium chloride infusion   Intravenous Continuous Roddenberry, Myron G, PA-C       acetaminophen (TYLENOL) 160 MG/5ML solution 1,000 mg  1,000 mg Oral Q6H [name], MD   1,000 mg at 11/04/21 1825   Or   acetaminophen (TYLENOL) tablet 1,000 mg  1,000 mg Oral Q6H Bartle, Payton Doughty, MD       amiodarone (NEXTERONE PREMIX) 360-4.14 MG/200ML-% (1.8 mg/mL) IV infusion  30 mg/hr Intravenous Continuous Bensimhon, Bevelyn Buckles, MD 16.67 mL/hr at 11/05/21 0700 30 mg/hr at 11/05/21 0700   aspirin tablet 325 mg  325 mg Oral Daily [name], MD    325 mg at 11/04/21 1116   Or   aspirin EC tablet 325 mg  325 mg Oral Daily [name], MD       atorvastatin (LIPITOR) tablet 80 mg  80 mg Oral Daily Roddenberry, Myron G, PA-C   80 mg at 11/04/21 1116   bisacodyl (DULCOLAX) EC tablet 10 mg  10 mg Oral Daily Roddenberry, Myron G, PA-C   10 mg at 11/04/21 1147   Or   bisacodyl (DULCOLAX) suppository 10 mg  10 mg Rectal Daily Roddenberry, Myron G, PA-C       chlorhexidine (PERIDEX) 0.12 % solution 15 mL  15 mL Mouth Rinse BID [name], MD       Chlorhexidine Gluconate Cloth 2 % PADS 6 each  6 each Topical Q0600 [name], MD   6 each at 11/04/21 1000   docusate (COLACE) 50 MG/5ML liquid 200 mg  200 mg Oral BID [name], MD   200 mg at 11/04/21 2201   EPINEPHrine (ADRENALIN) 5 mg in NS 250 mL (0.02 mg/mL) premix infusion  5 mcg/min Intravenous Continuous [name], MD 15 mL/hr at 11/05/21 0700 5 mcg/min at 11/05/21 0700   escitalopram (LEXAPRO) tablet 10 mg  10 mg Oral Daily Roddenberry, Myron G, PA-C   10 mg at 11/04/21 1116   furosemide (LASIX) injection 40 mg  40 mg Intravenous BID [name], MD       insulin aspart (novoLOG) injection 0-24 Units  0-24 Units Subcutaneous Q4H Corliss Skains, MD   2 Units at 11/05/21 9753   lactated ringers infusion   Intravenous Continuous Roddenberry, Myron G, PA-C       latanoprost (XALATAN) 0.005 % ophthalmic solution 1 drop  1 drop Both Eyes QHS Roddenberry, Myron G, PA-C   1 drop at 11/04/21 2212   lidocaine (LIDODERM) 5 % 2 patch  2 patch Transdermal Q24H Barrett, Erin R, PA-C   2 patch at 11/04/21 1120   MEDLINE mouth rinse  15 mL Mouth Rinse BID [name], MD   15 mL at 11/04/21 1030   MEDLINE mouth rinse  15 mL Mouth Rinse q12n4p [name], MD   15 mL at 11/04/21 1500   milrinone (PRIMACOR) 20 MG/100 ML (0.2 mg/mL) infusion  0.25 mcg/kg/min Intravenous Continuous [name], MD 6.99 mL/hr at 11/05/21 0631 0.25 mcg/kg/min at 11/05/21 0631    morphine (PF) 2 MG/ML injection 2-4 mg  2-4 mg Intravenous Q2H PRN Barrett, Erin R, PA-C   2 mg at 11/05/21 0204   nitroGLYCERIN 50 mg in dextrose 5 % 250 mL (0.2 mg/mL) infusion  0-100 mcg/min Intravenous Titrated Roddenberry, Myron G, PA-C       norepinephrine (LEVOPHED) 16 mg in premix infusion  0-40 mcg/min Intravenous Titrated Bensimhon, Bevelyn Buckles, MD  5.63 mL/hr at 11/05/21 0700 6 mcg/min at 11/05/21 0700   ondansetron (ZOFRAN) injection 4 mg  4 mg Intravenous Q6H PRN Leary Roca, PA-C   4 mg at 11/04/21 2221   oxyCODONE (Oxy IR/ROXICODONE) immediate release tablet 5-10 mg  5-10 mg Oral Q3H PRN Roddenberry, Myron G, PA-C   5 mg at 11/02/21 0831   pantoprazole (PROTONIX) injection 40 mg  40 mg Intravenous Q12H Bensimhon, Bevelyn Buckles, MD   40 mg at 11/04/21 2201   potassium chloride 10 mEq in 50 mL *CENTRAL LINE* IVPB  10 mEq Intravenous Q1 Hr x 4 Bartle, Payton Doughty, MD       sodium chloride flush (NS) 0.9 % injection 10-40 mL  10-40 mL Intracatheter Q12H [name], MD   40 mL at 11/04/21 2213   sodium chloride flush (NS) 0.9 % injection 10-40 mL  10-40 mL Intracatheter PRN [name], MD       sodium chloride flush (NS) 0.9 % injection 3 mL  3 mL Intravenous Q12H Roddenberry, Myron G, PA-C   3 mL at 11/04/21 2213   sodium chloride flush (NS) 0.9 % injection 3 mL  3 mL Intravenous PRN Roddenberry, Myron G, PA-C       traMADol (ULTRAM) tablet 50-100 mg  50-100 mg Oral Q4H PRN Roddenberry, Myron G, PA-C   100 mg at 11/02/21 7026   Warfarin - Physician Dosing Inpatient   Does not apply q1600 [name], MD   Given at 11/02/21 1655   No Known Allergies  Social History   Socioeconomic History   Marital status: Married    Spouse name: Not on file   Number of children: Not on file   Years of education: Not on file   Highest education level: Not on file  Occupational History   Not on file  Tobacco Use   Smoking status: Former    Types: Cigarettes    Quit date: 1970     Years since quitting: 53.4   Smokeless tobacco: Never  Substance and Sexual Activity   Alcohol use: Not Currently   Drug use: Never   Sexual activity: Not on file  Other Topics Concern   Not on file  Social History Narrative   Not on file   Social Determinants of Health   Financial Resource Strain: Not on file  Food Insecurity: Not on file  Transportation Needs: Not on file  Physical Activity: Not on file  Stress: Not on file  Social Connections: Not on file  Intimate Partner Violence: Not on file   Family History  Problem Relation Age of Onset   Heart attack Mother    Hypertension Mother    Heart attack Father    Hypertension Father    Stomach cancer Sister    Brain cancer Sister    There were no vitals taken for this visit.  Wt Readings from Last 3 Encounters:  11/05/21 97.7 kg (215 lb 6.2 oz)  10/23/21 99.8 kg (220 lb)  10/22/21 99.8 kg (220 lb)   PHYSICAL EXAM: General:  NAD. No resp difficulty, walked into clinic with cane. HEENT: Normal Neck: Supple. JVP 7-8. Carotids 2+ bilat; no bruits. No lymphadenopathy or thryomegaly appreciated. Cor: PMI nondisplaced. Regular rate & rhythm. No rubs, gallops or murmurs. Lungs: Clear Abdomen: Soft, nontender, nondistended. No hepatosplenomegaly. No bruits or masses. Good bowel sounds. Extremities: No cyanosis, clubbing, rash, edema Neuro: Alert & oriented x 3, cranial nerves grossly intact. Moves all 4 extremities  w/o difficulty. Affect pleasant.  ECG: NSR with PVCs, 91 bpm (personally reviewed).  ReDs: 44%  ASSESSMENT & PLAN: 1. CAD - LHC 05/04: 100% m RCA and 90% m LAD.  - RCA managed medically d/t completed infarct.  - Echo (5/23) EF 25%, RV moderately reduced, moderate to severe MR - Initial plan for PCI to LAD but with moderate to severe ischemic MR I think better option is CABG/MVR with LIMA to LAD. If not candidate can consider PCI LAD with mTEER. Films reviewed with Dr. Excell Seltzer. LAD not ideal for PCI. Dr.  Laneta Simmers planning for surgery in next 3-4 weeks, once he has time to recover post MI.  - No chest pain. - Will need repeat echo in about 3 weeks. - He got 2 doses of Plavix (last dose 5/6). - Continue ASA/statin.   2. New systolic CHF/ICM - In setting of late presentation inferior STEMI - Echo EF 25-30%, RV moderately reduced, moderate to severe MR. - NYHA II-early III, appears mildly volume up on exam, REDs 44%. - Restart Lasix 20 mg daily + 20 KCL daily. - Continue digoxin 0.125 mg daily. - Continue spiro 25 mg daily. - Continue Farxiga 10 mg daily - Continue losartan 12.5 daily. No BP room to increase today.  - BMET, BNP and dig level today.   3. Mitral regurgitation - Moderate to severe ischemic MR on TEE.  - Discussed with TCTS as above. Plan for MVR at later date.   4. Aortic valve insufficiency - Mild to moderate AI - Will need to follow.   5. H/o AKI - Labs today.   5. Prediabetes - A1c 5.8 1/23 - Continue Farxiga    6. Physical deconditioning - Continue daily walks, I encouraged him to increase walks a little more each day.  7. Depression - He endorses depression surrounding recent health issues. - We discussed counseling and/or medication, and how depression can affect his recovery.  - He will think about it.   Follow up with APP in 1-2 weeks. Will arrange echo at that time. Discussed with Dr. Gala Romney regarding timing of surgery.  Prince Rome, FNP-BC 11/05/21

## 2021-11-05 NOTE — Discharge Instructions (Addendum)
Discharge Instructions:  1. You may shower, please wash incisions daily with soap and water and keep dry.  If you wish to cover wounds with dressing you may do so but please keep clean and change daily.  No tub baths or swimming until incisions have completely healed.  If your incisions become red or develop any drainage please call our office at [phone]  2. No Driving until cleared by Dr. Vivi Martens office and you are no longer using narcotic pain medications  3. Monitor your weight daily.. Please use the same scale and weigh at same time... If you gain 5-10 lbs in 48 hours with associated lower extremity swelling, please contact our office at [phone]  4. Fever of 101.5 for at least 24 hours with no source, please contact our office at [phone]  5. Activity- up as tolerated, please walk at least 3 times per day.  Avoid strenuous activity, no lifting, pushing, or pulling with your arms over 8-10 lbs for a minimum of 6 weeks  6. If any questions or concerns arise, please do not hesitate to contact our office at [facility] Stay Proper nutrition can help your body recover from illness and injury.   Foods and beverages high in protein, vitamins, and minerals help rebuild muscle loss, promote healing, & reduce fall risk.   In addition to eating healthy foods, a nutrition shake is an easy, delicious way to get the nutrition you need during and after your hospital stay  It is recommended that you continue to drink 2 bottles per day of:   Ensure Plus for at least 1 month (30 days) after your hospital stay   Tips for adding a nutrition shake into your routine: As allowed, drink one with vitamins or medications instead of water or juice Enjoy one as a tasty mid-morning or afternoon snack Drink cold or make a milkshake out of it Drink one instead of milk with cereal or snacks Use as a coffee creamer   Available at the following grocery stores and  pharmacies:           * [facility] [phone]            For COUPONS visit: [email or web address] or [email or web address]   Suggested Substitutions Ensure Plus = Boost Plus = Carnation Breakfast Essentials = Boost Compact   High-Calorie, High-Protein Nutrition Therapy (2021) A high-calorie, high-protein diet has been recommended to you. Your registered dietitian nutritionist (RDN) may have recommended this diet because you are having difficulty eating enough calories throughout the day, you have lost weight, and/or you need to add protein to your diet. Sometimes you may not feel like eating, even if you know the importance of good nutrition. The recommendations in this handout can help you with the following: Regaining your strength and energy Keeping your body healthy Healing and recovering from surgery or illness and fighting infection Tips: Schedule Your Meals and Snacks Several small meals and snacks are often better tolerated and digested than large meals. Strategies Plan to eat 3 meals and 3 snacks daily. Experiment with timing meals to find out when you have a larger appetite. Appetite may be greatest in the morning after not  eating all night so you may prefer to eat your larger meals and snacks in the morning and at lunch. Breakfast-type foods are often better tolerated so eat foods such as eggs, pancakes, waffles and cereal for any meal or snack. Carry snacks with you so you are prepared to eat every 2 to 3 hours. Determine what works best for you if your body's cues for feeling hungry or full are not working. Eat a small meal or snack even if you don't feel hungry. Set a timer to remind you when it is time to eat. Take a walk before you eat (with health care provider's approval). Light or moderate physical  activity can help you maintain muscle and increase your appetite. Make Eating Enjoyable Taking steps to make the experience enjoyable may help to increase your interest in eating and improve your appetite. Strategies: Eat with others whenever possible. Include your favorite foods to make meals more enjoyable. Try new foods. Save your beverage for the end of the meal so that you have more room for food before you get full. Add Calories to Your Meals and Snacks Try adding calorie-dense foods so that each bite provides more nutrition. Strategies Drink milk, chocolate milk, soy milk, or smoothies instead of low-calorie beverages such as diet drinks or water. Cook with milk or soy milk instead of water when making dishes such as hot cereal, cocoa, or pudding. Add jelly, jam, honey, butter or margarine to bread and crackers. Add jam or fruit to ice cream and as a topping over cake. Mix dried fruit, nuts, granola, honey, or dry cereal with yogurt or hot cereals. Enjoy snacks such as milkshakes, smoothies, pudding, ice cream, or custard. Blend a fruit smoothie of a banana, frozen berries, milk or soy milk, and 1 tablespoon nonfat powdered milk or protein powder. Add Protein to Your Meals and Snacks Choose at least one protein food at each meal and snack to increase your daily intake. Strategies Add  cup nonfat dry milk powder or protein powder to make a high-protein milk to drink or to use in recipes that call for milk. Vanilla or peppermint extract or unsweetened cocoa powder could help to boost the flavor. Add hard-cooked eggs, leftover meat, grated cheese, canned beans or tofu to noodles, rice, salads, sandwiches, soups, casseroles, pasta, tuna and other mixed dishes. Add powdered milk or protein powder to hot cereals, meatloaf, casseroles, scrambled eggs, sauces, cream soups, and shakes. Add beans and lentils to salads, soups, casseroles, and vegetable dishes. Eat cottage cheese or yogurt,  especially Greek yogurt, with fruit as a snack or dessert. Eat peanut or other nut butters on crackers, bread, toast, waffles, apples, bananas or celery sticks. Add it to milkshakes, smoothies, or desserts. Consider a ready-made protein shake. Your RDN will make recommendations. Add Fats to Your Meals and Snacks Try adding fats to your meals and snacks. Fat provides more calories in fewer bites than carbohydrate or protein and adds flavors to your foods. Strategies Snack on nuts and seeds or add them to foods like salads, pasta, cereals, yogurt, and ice cream.  Saut or stir-fry vegetables, meats, chicken, fish or tofu in olive or canola oil.  Add olive oil, other vegetable oils, butter or margarine to soups, vegetables, potatoes, cooked cereal, rice, pasta, bread, crackers, pancakes, or waffles. Snack on olives or add to pasta, pizza, or salad. Add avocado or guacamole to your salads, sandwiches, and other entrees. Include fatty fish such as salmon in your weekly meal plan. For  general food safety tips, especially for clients with immunocompromised conditions, ask your RDN for the Food Safety Nutrition Therapy handout. Small Meal and Snack Ideas These snacks and meals are recommended when you have to eat but aren't necessarily hungry.  They are good choices because they are high in protein and high in calories.  2 graham crackers 2 tablespoons peanut or other nut butter 1 cup milk 2 slices whole wheat toast topped with:  avocado, mashed Seasoning of your choice   cup Greek yogurt  cup fruit  cup granola 2 deviled egg halves 5 whole wheat crackers  1 cup cream of tomato soup  grilled cheese sandwich 1 toasted waffle topped with: 2 tablespoons peanut or nut butter 1 tablespoon jam  Trail mix made with:  cup nuts  cup dried fruit  cup cold cereal, any variety  cup oatmeal or cream of wheat cereal 1 tablespoon peanut or nut butter  cup diced fruit   High-Calorie, High-Protein  Sample 1-Day Menu View Nutrient Info Breakfast 1 egg, scrambled 1 ounce cheddar cheese 1 English muffin, whole wheat 1 tablespoon margarine 1 tablespoon jam  cup orange juice, fortified with calcium and vitamin D  Morning Snack 1 tablespoon peanut butter 1 banana 1 cup 1% milk  Lunch Tuna salad sandwich made with: 2 slices bread, whole wheat 3 ounces tuna mixed with: 1 tablespoon mayonnaise  cup pudding  Afternoon Snack  cup hummus  cup carrots 1 pita  Evening Meal Enchilada casserole made with: 2 corn tortillas 3 ounces ground beef, cooked  cup black beans, cooked  cup corn, cooked 1 ounce grated cheddar cheese  cup enchilada sauce  avocado, sliced, topping for enchilada 1 tablespoon sour cream, topping for enchilada Salad:  cup lettuce, shredded  cup tomatoes, chopped, for salad 1 tablespoon olive oil and vinegar dressing, for salad  Evening Snack  cup [place] yogurt  cup blueberries  cup granola   Low Sodium Nutrition Therapy  Eating less sodium can help you if you have high blood pressure, heart failure, or kidney or liver disease.   Your body needs a little sodium, but too much sodium can cause your body to hold onto extra water. This extra water will raise your blood pressure and can cause damage to your heart, kidneys, or liver as they are forced to work harder.   Sometimes you can see how the extra fluid affects you because your hands, legs, or belly swell. You may also hold water around your heart and lungs, which makes it hard to breathe.   Even if you take medication for blood pressure or a water pill (diuretic) to remove fluid, it is still important to have less salt in your diet.   Check with your primary care provider before drinking alcohol since it may affect the amount of fluid in your body and how your heart, kidneys, or liver work. Sodium in Food A low-sodium meal plan limits the sodium that you get from food and beverages to 1,500-2,000  milligrams (mg) per day. Salt is the main source of sodium. Read the nutrition label on the package to find out how much sodium is in one serving of a food.  Select foods with 140 milligrams (mg) of sodium or less per serving.  You may be able to eat one or two servings of foods with a little more than 140 milligrams (mg) of sodium if you are closely watching how much sodium you eat in a day.  Check the serving size  on the label. The amount of sodium listed on the label shows the amount in one serving of the food. So, if you eat more than one serving, you will get more sodium than the amount listed.  Tips Cutting Back on Sodium Eat more fresh foods.  Fresh fruits and vegetables are low in sodium, as well as frozen vegetables and fruits that have no added juices or sauces.  Fresh meats are lower in sodium than processed meats, such as bacon, sausage, and hotdogs.  Not all processed foods are unhealthy, but some processed foods may have too much sodium.  Eat less salt at the table and when cooking. One of the ingredients in salt is sodium.  One teaspoon of table salt has 2,300 milligrams of sodium.  Leave the salt out of recipes for pasta, casseroles, and soups. Be a Paramedic.  Food packages that say "Salt-free", sodium-free", "very low sodium," and "low sodium" have less than 140 milligrams of sodium per serving.  Beware of products identified as "Unsalted," "No Salt Added," "Reduced Sodium," or "Lower Sodium." These items may still be high in sodium. You should always check the nutrition label. Add flavors to your food without adding sodium.  Try lemon juice, lime juice, or vinegar.  Dry or fresh herbs add flavor.  Buy a sodium-free seasoning blend or make your own at home. You can purchase salt-free or sodium-free condiments like barbeque sauce in stores and online. Ask your registered dietitian nutritionist for recommendations and where to find them.   Eating in Restaurants Choose  foods carefully when you eat outside your home. Restaurant foods can be very high in sodium. Many restaurants provide nutrition facts on their menus or their websites. If you cannot find that information, ask your server. Let your server know that you want your food to be cooked without salt and that you would like your salad dressing and sauces to be served on the side.    Foods Recommended Food Group Foods Recommended  Grains Bread, bagels, rolls without salted tops Homemade bread made with reduced-sodium baking powder Cold cereals, especially shredded wheat and puffed rice Oats, grits, or cream of wheat Pastas, quinoa, and rice Popcorn, pretzels or crackers without salt Corn tortillas  Protein Foods Fresh meats and fish; [place] bacon (check the nutrition labels - make sure they are not packaged in a sodium solution) Canned or packed tuna (no more than 4 ounces at 1 serving) Beans and peas Soybeans) and tofu Eggs Nuts or nut butters without salt  Dairy Milk or milk powder Plant milks, such as rice and soy Yogurt, including Greek yogurt Small amounts of natural cheese (blocks of cheese) or reduced-sodium cheese can be used in moderation. (Swiss, ricotta, and fresh mozzarella cheese are lower in sodium than the others) Cream Cheese Low sodium cottage cheese  Vegetables Fresh and frozen vegetables without added sauces or salt Homemade soups (without salt) Low-sodium, salt-free or sodium-free canned vegetables and soups  Fruit Fresh and canned fruits Dried fruits, such as raisins, cranberries, and prunes  Oils Tub or liquid margarine, regular or without salt Canola, corn, peanut, olive, safflower, or sunflower oils  Condiments Fresh or dried herbs such as basil, bay leaf, dill, mustard (dry), nutmeg, paprika, parsley, rosemary, sage, or thyme.  Low sodium ketchup Vinegar  Lemon or lime juice Pepper, red pepper flakes, and cayenne. Hot sauce contains sodium, but if you use just a  drop or two, it will not add up to much.  Salt-free or  sodium-free seasoning mixes and marinades Simple salad dressings: vinegar and oil   Foods Not Recommended Food Group Foods Not Recommended  Grains Breads or crackers topped with salt Cereals (hot/cold) with more than 300 mg sodium per serving Biscuits, cornbread, and other "quick" breads prepared with baking soda Pre-packaged bread crumbs Seasoned and packaged rice and pasta mixes Self-rising flours  Protein Foods Cured meats: Bacon, ham, sausage, pepperoni and hot dogs Canned meats (chili, vienna sausage, or sardines) Smoked fish and meats Frozen meals that have more than 600 mg of sodium per serving Egg substitute (with added sodium)  Dairy Buttermilk Processed cheese spreads Cottage cheese (1 cup may have over 500 mg of sodium; look for low-sodium.) American or feta cheese Shredded Cheese has more sodium than blocks of cheese String cheese  Vegetables Canned vegetables (unless they are salt-free, sodium-free or low sodium) Frozen vegetables with seasoning and sauces Sauerkraut and pickled vegetables Canned or dried soups (unless they are salt-free, sodium-free, or low sodium) [place] fries and onion rings  Fruit Dried fruits preserved with additives that have sodium  Oils Salted butter or margarine, all types of olives  Condiments Salt, sea salt, kosher salt, onion salt, and garlic salt Seasoning mixes with salt Bouillon cubes Ketchup Barbeque sauce and Worcestershire sauce unless low sodium Soy sauce Salsa, pickles, olives, relish Salad dressings: ranch, blue cheese, [place], and [place].   Low Sodium Sample 1-Day Menu  Breakfast 1 cup cooked oatmeal  1 slice whole wheat bread toast  1 tablespoon peanut butter without salt  1 banana  1 cup 1% milk  Lunch Tacos made with: 2 corn tortillas   cup black beans, low sodium   cup roasted or grilled chicken (without skin)   avocado  Squeeze of lime juice  1 cup  salad greens  1 tablespoon low-sodium salad dressing   cup strawberries  1 orange  Afternoon Snack 1/3 cup grapes  6 ounces yogurt  Evening Meal 3 ounces herb-baked fish  1 baked potato  2 teaspoons olive oil   cup cooked carrots  2 thick slices tomatoes on:  2 lettuce leaves  1 teaspoon olive oil  1 teaspoon balsamic vinegar  1 cup 1% milk  Evening Snack 1 apple   cup almonds without salt   Low-Sodium Vegetarian (Lacto-Ovo) Sample 1-Day Menu  Breakfast 1 cup cooked oatmeal  1 slice whole wheat toast  1 tablespoon peanut butter without salt  1 banana  1 cup 1% milk  Lunch Tacos made with: 2 corn tortillas   cup black beans, low sodium   cup roasted or grilled chicken (without skin)   avocado  Squeeze of lime juice  1 cup salad greens  1 tablespoon low-sodium salad dressing   cup strawberries  1 orange  Evening Meal Stir fry made with:  cup tofu  1 cup brown rice   cup broccoli   cup green beans   cup peppers   tablespoon peanut oil  1 orange  1 cup 1% milk  Evening Snack 4 strips celery  2 tablespoons hummus  1 hard-boiled egg   Low-Sodium Vegan Sample 1-Day Menu  Breakfast 1 cup cooked oatmeal  1 tablespoon peanut butter without salt  1 cup blueberries  1 cup soymilk fortified with calcium, vitamin B12, and vitamin D  Lunch 1 small whole wheat pita   cup cooked lentils  2 tablespoons hummus  4 carrot sticks  1 medium apple  1 cup soymilk fortified with calcium, vitamin B12, and vitamin  D  Evening Meal Stir fry made with:  cup tofu  1 cup brown rice   cup broccoli   cup green beans   cup peppers   tablespoon peanut oil  1 cup cantaloupe  Evening Snack 1 cup soy yogurt   cup mixed nuts  Copyright 2020  Academy of Nutrition and Dietetics. All rights reserved  Sodium Free Flavoring Tips  When cooking, the following items may be used for flavoring instead of salt or seasonings that contain sodium. Remember: A little bit of spice  goes a long way! Be careful not to overseason. Spice Blend Recipe (makes about ? cup) 5 teaspoons onion powder  2 teaspoons garlic powder  2 teaspoons paprika  2 teaspoon dry mustard  1 teaspoon crushed thyme leaves   teaspoon white pepper   teaspoon celery seed Food Item Flavorings  Beef Basil, bay leaf, caraway, curry, dill, dry mustard, garlic, grape jelly, green pepper, mace, marjoram, mushrooms (fresh), nutmeg, onion or onion powder, parsley, pepper, rosemary, sage  Chicken Basil, cloves, cranberries, mace, mushrooms (fresh), nutmeg, oregano, paprika, parsley, pineapple, saffron, sage, savory, tarragon, thyme, tomato, turmeric  Egg Chervil, curry, dill, dry mustard, garlic or garlic powder, green pepper, jelly, mushrooms (fresh), nutmeg, onion powder, paprika, parsley, rosemary, tarragon, tomato  Fish Basil, bay leaf, chervil, curry, dill, dry mustard, green pepper, lemon juice, marjoram, mushrooms (fresh), paprika, pepper, tarragon, tomato, turmeric  Lamb Cloves, curry, dill, garlic or garlic powder, mace, mint, mint jelly, onion, oregano, parsley, pineapple, rosemary, tarragon, thyme  Pork Applesauce, basil, caraway, chives, cloves, garlic or garlic powder, onion or onion powder, rosemary, thyme  Veal Apricots, basil, bay leaf, currant jelly, curry, ginger, marjoram, mushrooms (fresh), oregano, paprika  Vegetables Basil, dill, garlic or garlic powder, ginger, lemon juice, mace, marjoram, nutmeg, onion or onion powder, tarragon, tomato, sugar or sugar substitute, salt-free salad dressing, vinegar  Desserts Allspice, anise, cinnamon, cloves, ginger, mace, nutmeg, vanilla extract, other extracts   Copyright 2020  Academy of Nutrition and Dietetics. All rights reserved  Fluid Restricted Nutrition Therapy  You have been prescribed this diet because your condition affects how much fluid you can eat or drink. If your heart, liver, or kidneys aren't working properly, you may not be able  to effectively eliminate fluids from the body and this may cause swelling (edema) in the legs, arms, and/or stomach. Drink no more than _________ liters or ________ ounces or ________cups of fluid per day.  You don't need to stop eating or drinking the same fluids you normally would, but you may need to eat or drink less than usual.  Your registered dietitian nutritionist will help you determine the correct amount of fluid to consume during the day Breakfast Include fluids taken with medications  Lunch Include fluids taken with medications  Dinner Include fluids taken with medications  Bedtime Snack Include fluids taken with medications     Tips What Are Fluids?  A fluid is anything that is liquid or anything that would melt if left at room temperature. You will need to count these foods and liquids--including any liquid used to take medication--as part of your daily fluid intake. Some examples are: Alcohol (drink only with your doctor's permission)  Coffee, tea, and other hot beverages  Gelatin (Jell-O)  Gravy  Ice cream, sherbet, sorbet  Ice cubes, ice chips  Milk, liquid creamer  Nutritional supplements  Popsicles  Vegetable and fruit juices; fluid in canned fruit  Watermelon  Yogurt  Soft drinks, lemonade, limeade  Soups  Syrup How Do I Measure My Fluid Intake? Record your fluid intake daily.  Tip: Every day, each time you eat or drink fluids, pour water in the same amount into an empty container that can hold the same amount of fluids you are allowed daily. This may help you keep track of how much fluid you are taking in throughout the day.  To accurately keep track of how much liquid you take in, measure the size of the cups, glasses, and bowls you use. If you eat soup, measure how much of it is liquid and how much is solid (such as noodles, vegetables, meat). Conversions for Measuring Fluid Intake  Milliliters (mL) Liters (L) Ounces (oz) Cups (c)  1000 1 32 4  1200 1.2 40  5  1500 1.5 50 6 1/4  1800 1.8 60 7 1/2  2000 2 67 8 1/3  Tips to Reduce Your Thirst Chew gum or suck on hard candy.  Rinse or gargle with mouthwash. Do not swallow.  Ice chips or popsicles my help quench thirst, but this too needs to be calculated into the total restriction. Melt ice chips or cubes first to figure out how much fluid they produce (for example, experiment with melting  cup ice chips or 2 ice cubes).  Add a lemon wedge to your water.  Limit how much salt you take in. A high salt intake might make you thirstier.  Don't eat or drink all your allowed liquids at once. Space your liquids out through the day.  Use small glasses and cups and sip slowly. If allowed, take your medications with fluids you eat or drink during a meal.   Fluid-Restricted Nutrition Therapy Sample 1-Day Menu  Breakfast 1 slice wheat toast  1 tablespoon peanut butter  1/2 cup yogurt (120 milliliters)  1/2 cup blueberries  1 cup milk (240 milliliters)   Lunch 3 ounces sliced [place]  2 slices whole wheat bread  1/2 cup lettuce for sandwich  2 slices tomato for sandwich  1 ounce reduced-fat, reduced-sodium cheese  1/2 cup fresh carrot sticks  1 banana  1 cup unsweetened tea (240 milliliters)   Evening Meal 8 ounces soup (240 milliliters)  3 ounces salmon  1/2 cup quinoa  1 cup green beans  1 cup mixed greens salad  1 tablespoon olive oil  1 cup coffee (240 milliliters)  Evening Snack 1/2 cup sliced peaches  1/2 cup frozen yogurt (120 milliliters)  1 cup water (240 milliliters)  Copyright 2020  Academy of Nutrition and Dietetics. All rights reserved   Information on my medicine - Coumadin   (Warfarin)  This medication education was reviewed with me or my healthcare representative as part of my discharge preparation.   Why was Coumadin prescribed for you? Coumadin was prescribed for you because you have a blood clot or a medical condition that can cause an increased risk of forming blood  clots. Blood clots can cause serious health problems by blocking the flow of blood to the heart, lung, or brain. Coumadin can prevent harmful blood clots from forming. As a reminder your indication for Coumadin is:  Blood Clot Prevention after Heart Valve Surgery  What test will check on my response to Coumadin? While on Coumadin (warfarin) you will need to have an INR test regularly to ensure that your dose is keeping you in the desired range. The INR (international normalized ratio) number is calculated from the result of the laboratory test called prothrombin time (PT).  If an  INR APPOINTMENT HAS NOT ALREADY BEEN MADE FOR YOU please schedule an appointment to have this lab work done by your health care provider within 7 days. Your INR goal is usually a number between:  2 to 3   What  do you need to  know  About  COUMADIN? Take Coumadin (warfarin) exactly as prescribed by your healthcare provider about the same time each day.  DO NOT stop taking without talking to the doctor who prescribed the medication.  Stopping without other blood clot prevention medication to take the place of Coumadin may increase your risk of developing a new clot or stroke.  Get refills before you run out.  What do you do if you miss a dose? If you miss a dose, take it as soon as you remember on the same day then continue your regularly scheduled regimen the next day.  Do not take two doses of Coumadin at the same time.  Important Safety Information A possible side effect of Coumadin (Warfarin) is an increased risk of bleeding. You should call your healthcare provider right away if you experience any of the following: Bleeding from an injury or your nose that does not stop. Unusual colored urine (red or dark brown) or unusual colored stools (red or black). Unusual bruising for unknown reasons. A serious fall or if you hit your head (even if there is no bleeding).  Some foods or medicines interact with Coumadin  (warfarin) and might alter your response to warfarin. To help avoid this: Eat a balanced diet, maintaining a consistent amount of Vitamin K. Notify your provider about major diet changes you plan to make. Avoid alcohol or limit your intake to 1 drink for women and 2 drinks for men per day. (1 drink is 5 oz. wine, 12 oz. beer, or 1.5 oz. liquor.)  Make sure that ANY health care provider who prescribes medication for you knows that you are taking Coumadin (warfarin).  Also make sure the healthcare provider who is monitoring your Coumadin knows when you have started a new medication including herbals and non-prescription products.  Coumadin (Warfarin)  Major Drug Interactions  Increased Warfarin Effect Decreased Warfarin Effect  Alcohol (large quantities) Antibiotics (esp. Septra/Bactrim, Flagyl, Cipro) Amiodarone (Cordarone) Aspirin (ASA) Cimetidine (Tagamet) Megestrol (Megace) NSAIDs (ibuprofen, naproxen, etc.) Piroxicam (Feldene) Propafenone (Rythmol SR) Propranolol (Inderal) Isoniazid (INH) Posaconazole (Noxafil) Barbiturates (Phenobarbital) Carbamazepine (Tegretol) Chlordiazepoxide (Librium) Cholestyramine (Questran) Griseofulvin Oral Contraceptives Rifampin Sucralfate (Carafate) Vitamin K   Coumadin (Warfarin) Major Herbal Interactions  Increased Warfarin Effect Decreased Warfarin Effect  Garlic Ginseng Ginkgo biloba Coenzyme Q10 Green tea St. John's wort    Coumadin (Warfarin) FOOD Interactions  Eat a consistent number of servings per week of foods HIGH in Vitamin K (1 serving =  cup)  Collards (cooked, or boiled & drained) Kale (cooked, or boiled & drained) Mustard greens (cooked, or boiled & drained) Parsley *serving size only =  cup Spinach (cooked, or boiled & drained) Swiss chard (cooked, or boiled & drained) Turnip greens (cooked, or boiled & drained)  Eat a consistent number of servings per week of foods MEDIUM-HIGH in Vitamin K (1 serving = 1 cup)   Asparagus (cooked, or boiled & drained) Broccoli (cooked, boiled & drained, or raw & chopped) Brussel sprouts (cooked, or boiled & drained) *serving size only =  cup Lettuce, raw (green leaf, endive, romaine) Spinach, raw Turnip greens, raw & chopped   These websites have more information on Coumadin (warfarin):  FailFactory.se; VeganReport.com.au;

## 2021-11-05 NOTE — Progress Notes (Addendum)
Advanced Heart Failure Rounding Note  PCP-Cardiologist: None   Subjective:    05/19: Admit with a/c CHF. CO-OX okay but appeared low-output >>started milrinone 0.25 05/22: Developed A flutter. Started on amio drip.  05/26: S/p CABG x2 (LIMA-LAD, SVG-RAMUS INT) +MVR, MAZE, LAA Clip   Continues on 5 Epi, 0.25 milrinone, NE 14 >> 6. CO-OX 61%.  CVP 7-9 this am per RN, unable to check personally. Fluid leaking from line. Started on IV lasix 40 BID per TCTS.  Sitting up in chair. Feels okay, wants to get back in bed. Passing gas, no BM so far.    Able to get down full liquids.  Swan #s CVP 7-9 PA 50/19 SVR 953 Thermo 4.7/2.2   Objective:   Weight Range: 97.7 kg Body mass index is 28.42 kg/m.   Vital Signs:   Temp:  [97.2 F (36.2 C)-99.3 F (37.4 C)] 97.5 F (36.4 C) (05/30 0645) Pulse Rate:  [72-90] 86 (05/30 0700) Resp:  [10-25] 20 (05/30 0700) BP: (88-129)/(52-98) 121/71 (05/30 0700) SpO2:  [79 %-100 %] 96 % (05/30 0700) Arterial Line BP: (88-153)/(38-80) 135/80 (05/30 0700) Weight:  [97.7 kg] 97.7 kg (05/30 0500) Last BM Date : 10/29/21  Weight change: Filed Weights   11/01/21 0400 11/02/21 0500 11/05/21 0500  Weight: 93.2 kg 91.4 kg 97.7 kg    Intake/Output:   Intake/Output Summary (Last 24 hours) at 11/05/2021 0758 Last data filed at 11/05/2021 0700 Gross per 24 hour  Intake 2022.79 ml  Output 1420 ml  Net 602.79 ml      Physical Exam   General:  No distress. Sitting up in chair. HEENT: normal Neck: supple. JVP 8-10. Carotids 2+ bilat; no bruits.  Cor: PMI nondisplaced. Regular rate & rhythm. No rubs, gallops or murmurs. Sternum stable. Lungs: clear Abdomen: soft, nontender, nondistended. No hepatosplenomegaly. No bruits or masses. Good bowel sounds. Extremities: no cyanosis, clubbing, rash, 1+ edema. Neuro: alert & orientedx3, cranial nerves grossly intact. moves all 4 extremities w/o difficulty. Affect pleasant    Telemetry   SR 80s,  < 5 PVCs/min   Labs    CBC Recent Labs    11/04/21 0326 11/05/21 0355  WBC 19.0* 15.2*  HGB 8.5* 7.9*  HCT 24.4* 22.8*  MCV 85.6 86.0  PLT 118* 123XX123*   Basic Metabolic Panel Recent Labs    11/02/21 1702 11/03/21 0358 11/04/21 0326 11/05/21 0355  NA 131*   < > 130* 128*  K 4.5   < > 4.2 3.8  CL 96*   < > 95* 95*  CO2 25   < > 27 25  GLUCOSE 185*   < > 139* 141*  BUN 32*   < > 40* 44*  CREATININE 1.40*   < > 1.29* 1.16  CALCIUM 7.9*   < > 7.7* 7.6*  MG 2.6*  --  2.5*  --    < > = values in this interval not displayed.   Liver Function Tests Recent Labs    11/04/21 0326  AST 63*  ALT 38  ALKPHOS 89  BILITOT 0.8  PROT 5.1*  ALBUMIN 2.5*   No results for input(s): LIPASE, AMYLASE in the last 72 hours. Cardiac Enzymes No results for input(s): CKTOTAL, CKMB, CKMBINDEX, TROPONINI in the last 72 hours.  BNP: BNP (last 3 results) Recent Labs    10/10/21 1606 10/22/21 1623 10/24/21 2150  BNP 1,564.6* 1,226.7* 1,306.1*    ProBNP (last 3 results) No results for input(s): PROBNP in the last  8760 hours.   D-Dimer No results for input(s): DDIMER in the last 72 hours. Hemoglobin A1C No results for input(s): HGBA1C in the last 72 hours.  Fasting Lipid Panel No results for input(s): CHOL, HDL, LDLCALC, TRIG, CHOLHDL, LDLDIRECT in the last 72 hours. Thyroid Function Tests No results for input(s): TSH, T4TOTAL, T3FREE, THYROIDAB in the last 72 hours.  Invalid input(s): FREET3  Other results:   Imaging    No results found.   Medications:     Scheduled Medications:  acetaminophen (TYLENOL) oral liquid 160 mg/5 mL  1,000 mg Oral Q6H   Or   acetaminophen  1,000 mg Oral Q6H   aspirin  325 mg Oral Daily   Or   aspirin EC  325 mg Oral Daily   atorvastatin  80 mg Oral Daily   bisacodyl  10 mg Oral Daily   Or   bisacodyl  10 mg Rectal Daily   chlorhexidine  15 mL Mouth Rinse BID   Chlorhexidine Gluconate Cloth  6 each Topical Q0600   docusate  200  mg Oral BID   escitalopram  10 mg Oral Daily   furosemide  40 mg Intravenous BID   insulin aspart  0-24 Units Subcutaneous Q4H   latanoprost  1 drop Both Eyes QHS   lidocaine  2 patch Transdermal Q24H   mouth rinse  15 mL Mouth Rinse BID   mouth rinse  15 mL Mouth Rinse q12n4p   pantoprazole (PROTONIX) IV  40 mg Intravenous Q12H   sodium chloride flush  10-40 mL Intracatheter Q12H   sodium chloride flush  3 mL Intravenous Q12H   Warfarin - Physician Dosing Inpatient   Does not apply q1600    Infusions:  sodium chloride 10 mL/hr at 11/05/21 0700   sodium chloride     sodium chloride     amiodarone 30 mg/hr (11/05/21 0700)   epinephrine 5 mcg/min (11/05/21 0700)   lactated ringers     milrinone 0.25 mcg/kg/min (11/05/21 0631)   nitroGLYCERIN     norepinephrine (LEVOPHED) Adult infusion 6 mcg/min (11/05/21 0700)   potassium chloride      PRN Medications: sodium chloride, morphine injection, ondansetron (ZOFRAN) IV, oxyCODONE, sodium chloride flush, sodium chloride flush, traMADol    Patient Profile   76 y.o. male with hx COPD, prediabetes, HLD, GERD. Recent admit earlier this month with later presentation inferior STEMI and new systolic CHF w/ moderate to severe ischemic MR.   Readmitted with a/c CHF. Optimized from HF standpoint. Underwent CABG w MVR, MAZE and LAA clip 5/26.   Assessment/Plan   1. CAD/recent late presentation inferior STEMI - LHC 05/04: 100% m RCA and 90% m LAD.  - RCA managed medically d/t completed infarct.  - Echo (5/23) EF 25%, RV moderately reduced, moderate to severe MR - 5/26: S/p CABG x 2 (LIMA-LAD, SVG-Ramus INT)   - ASA + high intensity statin  - No s/s angina   2. Acute on chronic systolic CHF with low-output/ICM - s/p late presentation inferior STEMI 05/23 - Echo 05/23 EF 25-30%, RV moderately reduced, moderate to severe MR. - Limited echo on admit EF 30-35%, RV moderately reduced, MR not well evaluated, moderate pericardial effusion -  S/p CABG + MVR 5/26 - on Milrinone 0.25, NE 14 > 6, Epi 5. CO-OX 61%. - Continue to wean NE slowly. - CVP 7- 9. Started on IV lasix 40 BID this am per TCTS. Follow UOP. - Follow swan hemodynamics and co-ox. Has orders for SWAN out. -  Given age, fraility and reduced LV function suspect may be slow inotrope wean.   3. Mitral regurgitation - Moderate to severe ischemic MR on TEE.  - S/p tissue MVR 11/02/21 - On low-dose warfarin per TCTS. INR 4.6 today. Holding warfarin.   4. Aortic valve insufficiency - Mild to moderate AS/AI - stable.    5. Pericardial effusion/Acute Pericarditis  - Moderate in size on echo, no respiratory variation or RV collapse to suggest tamponade - acute pericarditis/ adhensions noted at time of CABG - CTs out   6. H/o AKI - Scr up slightly 1.10 > 1.4 from baseline but stable 1.16 today - Watch renal function closely    7. Prediabetes - A1c 5.8 1/23 - holding Kelly for now   8. PAF - s/p MAZE w/ CABG  - continue amio gtt while on inotrope  - warfarin started  9. Hyponatremia  - Na 133 -> 131 -> 130 -> 128  today   10. Post-op ileus - also has h/o esophageal stricture (was unable to pass TEE probe transgastric prior to CABG) - Passing gas. No BM yet. Continue Reglan and bowel regimen.  - mobilize as tolerated - can decompress with NGT as needed.  - now taking full liquids.  11. Post-op anemia - Hgb 10.4>9.2>7.9, follow - Recheck CBC this afternoon    Length of Stay: 12  [facility], [place], PA-C  11/05/2021, 7:58 AM  Advanced Heart Failure Team Pager [phone] (M-F; 7a - 5p)  Please contact [facility] for night-coverage after hours (5p -7a ) and weekends on [email or web address]  Agree with above.   Weaning NE. Remains on Epi 5 and milrinone 0.25. Swan #s stable.   PO intake improving. Passing gas. No BM.   General:  Sitting in chair No resp difficulty HEENT: normal Neck: supple. RIJ san Carotids 2+ bilat; no bruits. No lymphadenopathy or  thryomegaly appreciated. Cor: Sternal wound ok Regular rate & rhythm. No rubs, gallops or murmurs. Lungs: decreased at bases Abdomen: soft, nontender, nondistended. No hepatosplenomegaly. No bruits or masses. Good bowel sounds. Extremities: no cyanosis, clubbing, rash, tr edema Neuro: alert & orientedx3, cranial nerves grossly intact. moves all 4 extremities w/o difficulty. Affect pleasant  Continue to wean NE followed by EPI. Can pull swan. Has PICC so can remove introducer as well. Agree with lasix  Ileus resolving. Able to take po. Hold of on Cor-trak for now. Continue Ensure. Continue Reglan. Can give sorbitol. Will need barium swallow prior to d/c.   Continue PT/OT and IS.   CRITICAL CARE Performed by: Glori Bickers  Total critical care time: 35 minutes  Critical care time was exclusive of separately billable procedures and treating other patients.  Critical care was necessary to treat or prevent imminent or life-threatening deterioration.  Critical care was time spent personally by me (independent of midlevel providers or residents) on the following activities: development of treatment plan with patient and/or surrogate as well as nursing, discussions with consultants, evaluation of patient's response to treatment, examination of patient, obtaining history from patient or surrogate, ordering and performing treatments and interventions, ordering and review of laboratory studies, ordering and review of radiographic studies, pulse oximetry and re-evaluation of patient's condition.   Glori Bickers, MD  8:42 AM

## 2021-11-05 NOTE — Progress Notes (Addendum)
Nutrition Follow-up  DOCUMENTATION CODES:   Not applicable  INTERVENTION:   Dietary preferences taken; pt will be limited on FL diet due to dislikes/preferences  Ensure Enlive po QID, each supplement provides 350 kcal and 20 grams of protein. If pt able to drink 100% of all 4 Ensure Enlive, this provides 1400 kcals, 80 g of protein  Add Magic cup TID with meals, each supplement provides 290 kcal and 9 grams of protein  Add liquid MVI daily (liquid due to swallowing issues)  Pt may require Puree diet once diet advanced past FL  NUTRITION DIAGNOSIS:   Increased nutrient needs related to chronic illness (COPD, CHF) as evidenced by estimated needs.  Being addressed via supplements  GOAL:   Patient will meet greater than or equal to 90% of their needs  Progressing  MONITOR:   PO intake, Supplement acceptance, Labs, Weight trends, Skin, I & O's  REASON FOR ASSESSMENT:   Malnutrition Screening Tool    ASSESSMENT:   76 y.o. male with  a history of COPD, prediabetes, GERD, hyperlipidemia who presents for the evaluation of chest pain. Pt with acute combined CHF/ICM.  5/18 Admitted 5/26 MV replacement, CABG x 2  Pt tolerating some CL; pt does not like several items on his CL lunch tray including cranberry juice, iced tea and lemon italian ice. Preferences taken and placed in [facility]. Diet advanced to FL  Pt reports dysphagia dating back over 20 years; Wife has learned over the years how modify recipes and  cook modified food to help him with his swallowing. Pt reports swallowing has acutely worsened during this hospitalization. Pt is unable to swallow scrambled eggs, meats, fish unless cooked just right (soft/moist), fruits, vegetables. Pt can tolerate liquids and foods like mashed potatoes. Pt reports at baseline he can usually swallow pills but currently is unable to do so  Pt is edentulous and has been for a while; never had dentures. Pt reports he has no issues  chewing despite this  Noted TEE probe unable to be passed during CABG due to esophageal narrowing. Pt reports he has never been evaluated by GI MD. Pt would likely benefit from GI evaluation prior to discharge.   Noted no BM since 5/23, +flatus. Pt reports he does not feel constipated or uncomfortable. Bowel regimen in place, received sorbitol today. Noted pt on Reglan as well  At baseline, pt is very active. 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   Pt typically eats 2 meals per day; breakfast is scrambled eggs with ground sausage mixed in and "supper" varies but is whatever his wife prepares for him  Labs: sodium 128 (L) Meds: lasix, ss novolog   Diet Order:   Diet Order             Diet full liquid Room service appropriate? Yes; Fluid consistency: Thin  Diet effective now                   EDUCATION NEEDS:   Not appropriate for education at this time  Skin:  Skin Assessment: Reviewed RN Assessment  Last BM:  5/23  Height:   Ht Readings from Last 1 Encounters:  10/24/21 6\' 1"  (1.854 m)    Weight:   Wt Readings from Last 1 Encounters:  11/05/21 97.7 kg     BMI:  Body mass index is 28.42 kg/m.  Estimated Nutritional Needs:   Kcal:  2250-2450  Protein:  115-125 grams  Fluid:  >/= 2 L/day   [name] MS, RDN, LDN, CNSC Registered Dietitian III Clinical Nutrition RD Pager and On-Call Pager Number Located in [place]

## 2021-11-05 NOTE — Progress Notes (Signed)
4 Days Post-Op Procedure(s) (LRB): MITRAL VALVE (MV) REPLACEMENT USING MITRIS VALVE. (N/A) CORONARY ARTERY BYPASS GRAFTING (CABG) X TWO USING OPEN LEFT INTERNAL MAMMARY ARTERY AND ENDOSOPIC RIGHT GREATER SAPHENOUS VEIN HARVEST. (N/A) MAZE (N/A) TRANSESOPHAGEAL ECHOCARDIOGRAM (TEE) (N/A) CLIPPING OF ATRIAL APPENDAGE USING AN ATRICLIP PRO2 (N/A) Subjective: Feels ok, sore but sitting up in chair. Passing gas but no BM yet. Taking full liquids and Ensure which goes down ok.  Objective: Vital signs in last 24 hours: Temp:  [97.2 F (36.2 C)-99.3 F (37.4 C)] 97.5 F (36.4 C) (05/30 0645) Pulse Rate:  [72-90] 84 (05/30 0645) Cardiac Rhythm: Normal sinus rhythm (05/30 0000) Resp:  [10-25] 17 (05/30 0645) BP: (88-129)/(52-98) 112/71 (05/30 0600) SpO2:  [79 %-100 %] 97 % (05/30 0645) Arterial Line BP: (88-153)/(38-70) 137/59 (05/30 0645) Weight:  [97.7 kg] 97.7 kg (05/30 0500)  Hemodynamic parameters for last 24 hours: PAP: (31-54)/(8-25) 47/19 CVP:  [0 mmHg-9 mmHg] 7 mmHg PCWP:  [18 mmHg] 18 mmHg CO:  [4.3 L/min-4.9 L/min] 4.7 L/min CI:  [2 L/min/m2-2.3 L/min/m2] 2.2 L/min/m2  Intake/Output from previous day: 05/29 0701 - 05/30 0700 In: 1948.6 [I.V.:1948.6] Out: 1420 [Urine:1300; Chest Tube:120] Intake/Output this shift: Total I/O In: 864.4 [I.V.:864.4] Out: 675 [Urine:675]  General appearance: alert and cooperative Neurologic: intact Heart: regular rate and rhythm, S1, S2 normal, no murmur Lungs: clear to auscultation bilaterally Abdomen: soft, non-tender; bowel sounds normal Extremities: edema mild Wound: incision ok  Lab Results: Recent Labs    11/04/21 0326 11/05/21 0355  WBC 19.0* 15.2*  HGB 8.5* 7.9*  HCT 24.4* 22.8*  PLT 118* 122*   BMET:  Recent Labs    11/04/21 0326 11/05/21 0355  NA 130* 128*  K 4.2 3.8  CL 95* 95*  CO2 27 25  GLUCOSE 139* 141*  BUN 40* 44*  CREATININE 1.29* 1.16  CALCIUM 7.7* 7.6*    PT/INR:  Recent Labs     11/05/21 0355  LABPROT 43.5*  INR 4.6*   ABG    Component Value Date/Time   PHART 7.300 (L) 11/02/2021 0151   HCO3 23.7 11/02/2021 0151   TCO2 25 11/02/2021 0151   ACIDBASEDEF 3.0 (H) 11/02/2021 0151   O2SAT 60.9 11/05/2021 0355   CBG (last 3)  Recent Labs    11/04/21 1932 11/04/21 2336 11/05/21 0355  GLUCAP 177* 121* 135*    Assessment/Plan: S/P Procedure(s) (LRB): MITRAL VALVE (MV) REPLACEMENT USING MITRIS VALVE. (N/A) CORONARY ARTERY BYPASS GRAFTING (CABG) X TWO USING OPEN LEFT INTERNAL MAMMARY ARTERY AND ENDOSOPIC RIGHT GREATER SAPHENOUS VEIN HARVEST. (N/A) MAZE (N/A) TRANSESOPHAGEAL ECHOCARDIOGRAM (TEE) (N/A) CLIPPING OF ATRIAL APPENDAGE USING AN ATRICLIP PRO2 (N/A)  POD 4  Hemodynamics stable on milrinone 0.25 and epi 5, NE weaned to  6. MAP 80's and CVP 7-9 with CI 2.2 and Co-ox 61. Continue to wean NE as tolerated. DC swan and sleeve to minimize infection.  Volume excess: Wt today is about 10 lbs over preop. Will start diuresis to avoid developing large pleural effusions. He had 2L of pleural effusion on one side and 1L on the other in the OR.   Glucose under good control on SSI.  INR has risen again to 4.6 after a single dose of Coumadin 2.5 two days ago. Hold off on further Coumadin. This is due to nutritional depletion. I would not give him vit K. He is taking Ensure now which will reverse this upward trend.   Esophageal dysphagia possibly due to stricture. He says he has  had this for many years but does not remember any workup. Full liquids seem to be going down ok. If he has a stricture a feeding tube will probably not pass easily. He may be ok on full liquids with supplements for the time being and then plan esophagram as he recovers.  PT, OT, IS.   LOS: 12 days    [name] 11/05/2021

## 2021-11-05 NOTE — Progress Notes (Signed)
Physical Therapy Treatment Patient Details Name: Keith Jenkins MRN: [id number] DOB: [birth year] Today's Date: 11/05/2021   History of Present Illness Pt is a 76 y.o. male admitted 10/24/21 witih SOB and chest pain after recent STEMI. Workup for CHF, aflutter. S/p CABG, MVR 5/26. PMH includes pre-DM, HLD, arthritis, glaucoma.   PT Comments    Pt progressing with mobility. Today's session focused on transfer training (standing while maintaining sternal precautions) and seated/standing tolerance; pt requires intermittent minA for stability. Pt endorses increased fatigue having sat in recliner since 6AM, declining further ambulation; hope to progress this next session. Pt remains limited by generalized weakness, decreased activity tolerance, and impaired balance strategies/postural reactions. Will continue to follow acutely to address established goals.  SpO2 97% on RA, HR 80s    Recommendations for follow up therapy are one component of a multi-disciplinary discharge planning process, led by the attending physician.  Recommendations may be updated based on patient status, additional functional criteria and insurance authorization.  Follow Up Recommendations  Home health PT     Assistance Recommended at Discharge Intermittent Supervision/Assistance  Patient can return home with the following A little help with walking and/or transfers;A little help with bathing/dressing/bathroom;Assistance with cooking/housework;Assist for transportation;Help with stairs or ramp for entrance   Equipment Recommendations   (TBD - RW/rollator vs. other)    Recommendations for Other Services       Precautions / Restrictions Precautions Precautions: Fall;Sternal Precaution Comments: Theone Murdoch, a-line     Mobility  Bed Mobility Overal bed mobility: Needs Assistance Bed Mobility: Sit to Supine       Sit to supine: Min assist   General bed mobility comments: MinA for LE management return to supine     Transfers Overall transfer level: Needs assistance Equipment used: None Transfers: Sit to/from Stand, Bed to chair/wheelchair/BSC Sit to Stand: Min assist, Min guard   Step pivot transfers: Min guard       General transfer comment: initial stand from recliner with minA for trunk elevation, educ on pushing with RUE hand on knee and use of momentum to power up; step pivot from recliner to bed with min guard; repeated sit<>stands from EOB with minA, progressing to min guard    Ambulation/Gait                   Stairs             Wheelchair Mobility    Modified Rankin (Stroke Patients Only)       Balance Overall balance assessment: Needs assistance Sitting-balance support: No upper extremity supported, Feet supported Sitting balance-Leahy Scale: Fair     Standing balance support: No upper extremity supported, During functional activity Standing balance-Leahy Scale: Fair Standing balance comment: can static stand and take steps without UE support, min guard                            Cognition Arousal/Alertness: Awake/alert Behavior During Therapy: WFL for tasks assessed/performed, Flat affect Overall Cognitive Status: Within Functional Limits for tasks assessed                                          Exercises Other Exercises Other Exercises: 5x repeated sit<>stand from EOB (increased time for seated rest between reps)    General Comments General comments (skin integrity, edema, etc.): SpO2 97% on RA,  HR 80s. pt's wife and daughter present at end of session, supportive      Pertinent Vitals/Pain Pain Assessment Pain Assessment: Faces Faces Pain Scale: Hurts a little bit Pain Location: sternal incision, generalized Pain Descriptors / Indicators: Discomfort, Tiring Pain Intervention(s): Monitored during session, Limited activity within patient's tolerance, Repositioned    Home Living                           Prior Function            PT Goals (current goals can now be found in the care plan section) Progress towards PT goals: Progressing toward goals    Frequency    Min 3X/week      PT Plan Current plan remains appropriate    Co-evaluation              AM-PAC PT "6 Clicks" Mobility   Outcome Measure  Help needed turning from your back to your side while in a flat bed without using bedrails?: A Lot Help needed moving from lying on your back to sitting on the side of a flat bed without using bedrails?: A Lot Help needed moving to and from a bed to a chair (including a wheelchair)?: A Little Help needed standing up from a chair using your arms (e.g., wheelchair or bedside chair)?: A Little Help needed to walk in hospital room?: Total Help needed climbing 3-5 steps with a railing? : Total 6 Click Score: 12    End of Session   Activity Tolerance: Patient tolerated treatment well Patient left: in bed;with call bell/phone within reach;with family/visitor present Nurse Communication: Mobility status PT Visit Diagnosis: Other abnormalities of gait and mobility (R26.89);Muscle weakness (generalized) (M62.81);Unsteadiness on feet (R26.81);Difficulty in walking, not elsewhere classified (R26.2);Pain     Time: 0626-9485 PT Time Calculation (min) (ACUTE ONLY): 19 min  Charges:  $Therapeutic Exercise: 8-22 mins                     Ina Homes, PT, DPT Acute Rehabilitation Services  Pager [phone] Office [phone]  Malachy Chamber 11/05/2021, 11:58 AM

## 2021-11-05 NOTE — Progress Notes (Signed)
CT surgery PM rounds  Patient sitting up in bedside commode Sinus rhythm with stable blood pressure on continued inotropic support I/O balance -900 cc O2 saturation 97% on room air Patient stood with PT today, still with generalized weakness  Blood pressure 109/71, pulse 76, temperature (!) 97.5 F (36.4 C), resp. rate 17, height 6\' 1"  (1.854 m), weight 97.7 kg, SpO2 100 %.

## 2021-11-05 NOTE — Progress Notes (Addendum)
Speech Language Pathology Treatment: Dysphagia  Patient Details Name: Keith Jenkins MRN: [id number] DOB: Jun 13, 1945 Today's Date: 11/05/2021 Time: 0801-0809 SLP Time Calculation (min) (ACUTE ONLY): 8 min  Assessment / Plan / Recommendation Clinical Impression  Pt continues to exhibit a suspected esophageal dysphagia. He reported he had his liquid Tylenol come back up yesterday and states it sticks and doesn't want to go up or down. PA arrived noting that the there was difficulty passing the scope during his TEE. He had just completed his breakfast but did consume water without apparent difficulty. He prefers to remain on the clear liquid diet for now. Educated that it would benefit him to consult GI once he is released from hospital or if GI could see him while he is in the hospital. Recommend he remain upright after meals and eat slowly. No further interventions needed from ST at this time and will sign off.   HPI HPI: Pt is a 76 year old male admitted for CHF on 5/19. Suffered STEMI on 5/23 and CABG on 5/26 and intubated until 5/27. Pt with difficulty swallowing on 5/28. Pt with no history of esopahgeal dysphagia in chart, but wife reports long history of globus and trouble swallowing pills.      SLP Plan  All goals met;Discharge SLP treatment due to (comment)      Recommendations for follow up therapy are one component of a multi-disciplinary discharge planning process, led by the attending physician.  Recommendations may be updated based on patient status, additional functional criteria and insurance authorization.    Recommendations  Diet recommendations: Thin liquid (clear liquid) Liquids provided via: Straw;Cup Medication Administration: Crushed with puree Supervision: Patient able to self feed Compensations: Slow rate;Small sips/bites Postural Changes and/or Swallow Maneuvers: Seated upright 90 degrees;Upright 30-60 min after meal                Oral Care Recommendations: Oral  care BID Follow Up Recommendations: No SLP follow up Assistance recommended at discharge: Intermittent Supervision/Assistance SLP Visit Diagnosis: Dysphagia, oropharyngeal phase (R13.12) Plan: All goals met;Discharge SLP treatment due to (comment)           Houston Siren  11/05/2021, 8:12 AM

## 2021-11-05 NOTE — Discharge Summary (Signed)
[address]       [place] [address]             [phone]    Physician Discharge Summary  Patient ID: Keith Jenkins MRN: [id number] DOB/AGE: 1945/08/23 76 y.o.  Admit date: 10/24/2021 Discharge date: 11/15/2021  Admission Diagnoses:  Patient Active Problem List   Diagnosis Date Noted   Mitral regurgitation 11/01/2021   Acute on chronic combined systolic (congestive) and diastolic (congestive) heart failure (HCC) 10/24/2021   Pericardial effusion    Severe mitral regurgitation    NSTEMI (non-ST elevated myocardial infarction) (HCC) 10/10/2021   Non-ST elevation (NSTEMI) myocardial infarction (HCC) 10/10/2021   Acute systolic heart failure (HCC) 10/10/2021   Hyperlipidemia 10/10/2021   Discharge Diagnoses:  Patient Active Problem List   Diagnosis Date Noted   Esophageal dysphagia    Mitral regurgitation 11/01/2021   S/P mitral valve replacement 11/01/2021   Acute on chronic combined systolic (congestive) and diastolic (congestive) heart failure (HCC) 10/24/2021   Pericardial effusion    Severe mitral regurgitation    NSTEMI (non-ST elevated myocardial infarction) (HCC) 10/10/2021   Non-ST elevation (NSTEMI) myocardial infarction (HCC) 10/10/2021   Acute systolic heart failure (HCC) 10/10/2021   Hyperlipidemia 10/10/2021   Discharged Condition: good  History of Present Illness:  The patient is a 76 year old male we have seen previously in consideration for CABG/mitral valve replacement or repair.  He was evaluated by Dr. Laneta Simmers and was to be scheduled however the patient presented last week with an inferior ST elevation myocardial infarction.  He developed chest pain/pressure with associated dyspnea approximately 5 days prior to presentation.  This hospitalization was from 10/10/2021 until 10/16/2021.  Echocardiogram (TTE) revealed an ejection fraction of 20 to 25%, RWMA, moderately reduced right ventricular function, moderate to severe ischemic MR,  aortic root 4.3 cm, dilated IVC with estimated RA pressure of 8 mmHg.  This was subsequently followed by a TEE which showed posterior leaflet restriction with moderate 2-3+ MR with flow reversal in pulmonary veins.  LVEF for this study was 25 to 30%.     He subsequently represented to the hospital on 10/24/2021 with acute combined CHF/ICM.  He presented with shortness of breath and chest pain to the [facility] emergency department on 10/23/2021.  He had reported weight gain of approximately 7 pounds and chest x-ray revealed bilateral pleural effusions.  [facility]BNP at [facility] was significantly elevated at 6000.  He was started on a course of diuretics and admitted for further medical optimization by the AHF team.  He is also noted to have a pericardial effusion but no evidence of tamponade and is being monitored clinically.  Cardiac catheterization shows severe two-vessel disease in the LAD and RCA systems.  It was felt the patient should undergo surgical intervention during this admission.  The risks and benefits of the procedure were explained to the patient and he was agreeable to proceed.    Hospital Course:  The patient was medically optimized by the advanced heart failure team.  He was medically stable and was taken to the operating room on 11/01/2021.  He underwent CABG x 2, Mitral Valve Replacement, MAZE procedure with clipping of LA appendage and endoscopic harvest of greater saphenous vein from his right leg.  He tolerated the procedure without difficulty and was taken to the SICU in stable condition.  He was extubated the evening of surgery.  He was followed closely by the Advanced heart failure team.  He was weaned off  Epinephrine, Neo-synephrine, and Milrinone drips as hemodynamics allowed.  He was started on coumadin for his mitral valve repair.  He has history of esophageal stricture and was having difficulty getting food down.  SLP evaluation was obtained and they recommended the patient be placed on a  thin liquid diet.  He was started on IV diuretics for volume overloaded state.  His INR became supra-therapeutic after several doses of coumadin.  Due to this the medicine was held until INR was improved.  The patient continued to have difficulty with swallowing.  Barium esophogram was ordered and revealed a nonobstructive stricture.  Soft diet was ordered per recommendations.  Inotropic support with milrinone continued.  He was weaned off the norepinephrine.  The patient required midodrine to help with hypotension in the setting of aggressive diuresis.  He was hyponatremic and was treated with Tolvaptan.  He has been working with PT/OT who recommends home health, which has been arranged.  His Milrinone was weaned off on 11/13/2021.  His follow up CO-OX was 61.4.  He was felt stable for transfer to the progressive care unit on 11/13/2021.  The patient remained volume overloaded.  CVP levels were monitored regularly.  He remained on aggressive IV diuresis.  He remains on coumadin at 1 mg daily.  His most recent INR is ***.  He continues to work with physical therapy and is progressing well.  Surgical incisions are healing without evidence of infection.  He is medically stable for discharge home today.  Consults:  AHF  Significant Diagnostic Studies: cardiac graphics:   Echocardiogram:    IMPRESSIONS    1. Left ventricular ejection fraction, by estimation, is 30 to 35%. The  left ventricle has moderately decreased function. The left ventricle  demonstrates regional wall motion abnormalities. Inferior/inferolateral  akinesis.   2. Right ventricular systolic function is moderately reduced. The right  ventricular size is mildly enlarged. There is mildly elevated pulmonary  artery systolic pressure. The estimated right ventricular systolic  pressure is 123XX123 mmHg.   3. Eccentric posterior directed mitral regurgitation jet, not well  evaluated on current study   4. The inferior vena cava is dilated in size  with <50% respiratory  variability, suggesting right atrial pressure of 15 mmHg.   5. Moderate pericardial effusion adjacent to RA/RV, best seen on  subcostal images. IVC fixed/dilated but no significant mitral inflow  respiratory variation or RV collapse seen to suggest tamponade   Treatments: surgery:   11/01/2021   Surgeon:  Gaye Pollack, MD   First Assistant: Enid Cutter,  PA-C:   An experienced assistant was required given the complexity of this surgery and the standard of surgical care. The assistant was needed for endoscopic vein harvesting, exposure, dissection, suctioning, retraction of delicate tissues and sutures, instrument exchange and for overall help during this procedure.     Preoperative Diagnosis:  Severe multi-vessel coronary artery disease   Postoperative Diagnosis:  Same   Procedure:   Median Sternotomy Extracorporeal circulation 3.   Coronary artery bypass grafting x 2   Left internal mammary artery graft to the LAD SVG to diagonal     4.   Endoscopic vein harvest from the right leg 5.   Bi-atrial MAZE procedure with clipping of left atrial appendage. 6.   Chordal-sparing mitral valve replacement using a 27 mm Edwards MITRIS pericardial valve.    Discharge Exam: Blood pressure 97/63, pulse 71, temperature 98.6 F (37 C), temperature source Oral, resp. rate 19, height 6\' 1"  (1.854 m),  weight 96.8 kg, SpO2 93 %. {physical [id number]   Discharge Medications:  The patient has been discharged on:   1.Beta Blocker:  Yes [   ]                              No   [ x  ]                              If No, reason: hypotension  2.Ace Inhibitor/ARB: Yes [   ]                                     No  [ x   ]                                     If No, reason: hypotension on Midodrine  3.Statin:   Yes [ X  ]                  No  [   ]                  If No, reason:  4.Ecasa:  Yes  [ X  ]                  No   [   ]                  If No,  reason:  Patient had ACS upon admission: No  Plavix/P2Y12 inhibitor: Yes [   ]                                      No  [ X  ]    Allergies as of 11/15/2021   No Known Allergies   Med Rec must be completed prior to using this [facility]***        Durable Medical Equipment  (From admission, onward)           Start     Ordered   11/11/21 1531  For home use only DME 4 wheeled rolling walker with seat  Once       Question Answer Comment  Patient needs a walker to treat with the following condition HF (heart failure) ([place])   Patient needs a walker to treat with the following condition S/P CABG x 2      11/11/21 1535   11/11/21 1302  For home use only DME 4 wheeled rolling walker with seat  Once       Question Answer Comment  Patient needs a walker to treat with the following condition S/P MVR (mitral valve replacement)   Patient needs a walker to treat with the following condition Physical deconditioning      11/11/21 1302            Follow-up Information     Gaye Pollack, MD Follow up.   Specialty: Cardiothoracic Surgery Contact information: [address] [place] [address] [phone]                 Signed:  Ellwood Handler, PA-C  11/15/2021, 8:56 AM

## 2021-11-06 ENCOUNTER — Encounter (HOSPITAL_COMMUNITY): Payer: Medicare HMO

## 2021-11-06 DIAGNOSIS — I5043 Acute on chronic combined systolic (congestive) and diastolic (congestive) heart failure: Secondary | ICD-10-CM | POA: Diagnosis not present

## 2021-11-06 LAB — ECHO INTRAOPERATIVE TEE
Height: 73 in
MV M vel: 4.3 m/s
MV Peak grad: 74 mmHg
MV Vena cont: 0.9 cm
Radius: 0.8 cm
Weight: 3287.5 oz

## 2021-11-06 LAB — BASIC METABOLIC PANEL
Anion gap: 7 (ref 5–15)
BUN: 44 mg/dL — ABNORMAL HIGH (ref 8–23)
CO2: 27 mmol/L (ref 22–32)
Calcium: 7.4 mg/dL — ABNORMAL LOW (ref 8.9–10.3)
Chloride: 97 mmol/L — ABNORMAL LOW (ref 98–111)
Creatinine, Ser: 1 mg/dL (ref 0.61–1.24)
GFR, Estimated: >60 mL/min (ref >60)
Glucose, Bld: 185 mg/dL — ABNORMAL HIGH (ref 70–99)
Potassium: 3.8 mmol/L (ref 3.5–5.1)
Sodium: 131 mmol/L — ABNORMAL LOW (ref 135–145)

## 2021-11-06 LAB — CBC
HCT: 23 % — ABNORMAL LOW (ref 39.0–52.0)
Hemoglobin: 7.9 g/dL — ABNORMAL LOW (ref 13.0–17.0)
MCH: 29.5 pg (ref 26.0–34.0)
MCHC: 34.3 g/dL (ref 30.0–36.0)
MCV: 85.8 fL (ref 80.0–100.0)
Platelets: 135 10*3/uL — ABNORMAL LOW (ref 150–400)
RBC: 2.68 MIL/uL — ABNORMAL LOW (ref 4.22–5.81)
RDW: 13.8 % (ref 11.5–15.5)
WBC: 11.8 10*3/uL — ABNORMAL HIGH (ref 4.0–10.5)
nRBC: 0.9 % — ABNORMAL HIGH (ref 0.0–0.2)

## 2021-11-06 LAB — COOXEMETRY PANEL
Carboxyhemoglobin: 0.7 % (ref 0.5–1.5)
Methemoglobin: <0.7 % (ref 0.0–1.5)
O2 Saturation: 63 %
Total hemoglobin: 8.1 g/dL — ABNORMAL LOW (ref 12.0–16.0)

## 2021-11-06 LAB — GLUCOSE, CAPILLARY
Glucose-Capillary: 102 mg/dL — ABNORMAL HIGH (ref 70–99)
Glucose-Capillary: 119 mg/dL — ABNORMAL HIGH (ref 70–99)
Glucose-Capillary: 123 mg/dL — ABNORMAL HIGH (ref 70–99)
Glucose-Capillary: 160 mg/dL — ABNORMAL HIGH (ref 70–99)
Glucose-Capillary: 162 mg/dL — ABNORMAL HIGH (ref 70–99)
Glucose-Capillary: 170 mg/dL — ABNORMAL HIGH (ref 70–99)

## 2021-11-06 LAB — PROTIME-INR
INR: 2.1 — ABNORMAL HIGH (ref 0.8–1.2)
Prothrombin Time: 23.3 seconds — ABNORMAL HIGH (ref 11.4–15.2)

## 2021-11-06 LAB — MAGNESIUM: Magnesium: 2.4 mg/dL (ref 1.7–2.4)

## 2021-11-06 MED ORDER — POTASSIUM CHLORIDE 20 MEQ PO PACK
40.0000 meq | PACK | Freq: Once | ORAL | Status: AC
Start: 2021-11-06 — End: 2021-11-06
  Administered 2021-11-06: 40 meq via ORAL
  Filled 2021-11-06: qty 2

## 2021-11-06 MED ORDER — POTASSIUM CHLORIDE CRYS ER 20 MEQ PO TBCR
40.0000 meq | EXTENDED_RELEASE_TABLET | Freq: Once | ORAL | Status: DC
Start: 2021-11-06 — End: 2021-11-06

## 2021-11-06 MED ORDER — WARFARIN SODIUM 2.5 MG PO TABS
2.5000 mg | ORAL_TABLET | Freq: Every day | ORAL | Status: DC
  Administered 2021-11-06 – 2021-11-10 (×5): 2.5 mg via ORAL
  Filled 2021-11-06 (×5): qty 1

## 2021-11-06 MED ORDER — ASPIRIN 81 MG PO CHEW
81.0000 mg | CHEWABLE_TABLET | Freq: Every day | ORAL | Status: DC
  Administered 2021-11-06 – 2021-11-18 (×13): 81 mg via ORAL
  Filled 2021-11-06 (×13): qty 1

## 2021-11-06 MED ORDER — INSULIN ASPART 100 UNIT/ML IJ SOLN
0.0000 [IU] | Freq: Three times a day (TID) | INTRAMUSCULAR | Status: DC
  Administered 2021-11-06: 4 [IU] via SUBCUTANEOUS
  Administered 2021-11-07 – 2021-11-08 (×3): 2 [IU] via SUBCUTANEOUS
  Administered 2021-11-08: 4 [IU] via SUBCUTANEOUS
  Administered 2021-11-09: 2 [IU] via SUBCUTANEOUS
  Administered 2021-11-10: 4 [IU] via SUBCUTANEOUS
  Administered 2021-11-10 – 2021-11-16 (×8): 2 [IU] via SUBCUTANEOUS
  Administered 2021-11-17 – 2021-11-18 (×2): 4 [IU] via SUBCUTANEOUS

## 2021-11-06 MED ORDER — FUROSEMIDE 10 MG/ML IJ SOLN
60.0000 mg | Freq: Two times a day (BID) | INTRAMUSCULAR | Status: DC
Start: 2021-11-06 — End: 2021-11-06

## 2021-11-06 MED ORDER — FUROSEMIDE 10 MG/ML IJ SOLN
60.0000 mg | Freq: Once | INTRAMUSCULAR | Status: AC
  Administered 2021-11-06: 60 mg via INTRAVENOUS
  Filled 2021-11-06: qty 6

## 2021-11-06 MED ORDER — DIGOXIN 125 MCG PO TABS
0.1250 mg | ORAL_TABLET | Freq: Every day | ORAL | Status: DC
  Administered 2021-11-06 – 2021-11-18 (×13): 0.125 mg via ORAL
  Filled 2021-11-06 (×13): qty 1

## 2021-11-06 MED ORDER — INSULIN DETEMIR 100 UNIT/ML ~~LOC~~ SOLN
20.0000 [IU] | Freq: Every day | SUBCUTANEOUS | Status: DC
Start: 2021-11-06 — End: 2021-11-07
  Administered 2021-11-06: 20 [IU] via SUBCUTANEOUS
  Filled 2021-11-06 (×2): qty 0.2

## 2021-11-06 NOTE — Plan of Care (Signed)
  Problem: Education: Goal: Ability to demonstrate management of disease process will improve Outcome: Progressing Goal: Ability to verbalize understanding of medication therapies will improve Outcome: Progressing Goal: Individualized Educational Video(s) Outcome: Progressing   Problem: Activity: Goal: Capacity to carry out activities will improve Outcome: Progressing   

## 2021-11-06 NOTE — Progress Notes (Signed)
5 Days Post-Op Procedure(s) (LRB): MITRAL VALVE (MV) REPLACEMENT USING 27MM MITRIS VALVE. (N/A) CORONARY ARTERY BYPASS GRAFTING (CABG) X TWO USING OPEN LEFT INTERNAL MAMMARY ARTERY AND ENDOSOPIC RIGHT GREATER SAPHENOUS VEIN HARVEST. (N/A) MAZE (N/A) TRANSESOPHAGEAL ECHOCARDIOGRAM (TEE) (N/A) CLIPPING OF ATRIAL APPENDAGE USING AN ATRICLIP PRO2 45MM (N/A) Subjective: Feels better today. Ambulated short hall outside room to the window. Sitting up in chair.  Remains on milrinone 0.25, epi 5, NE 4.  MAP 70's by cuff, CVP 9, Co-ox 63.  Objective: Vital signs in last 24 hours: Temp:  [97 F (36.1 C)-97.9 F (36.6 C)] 97.8 F (36.6 C) (05/31 0655) Pulse Rate:  [75-89] 79 (05/31 0715) Cardiac Rhythm: Normal sinus rhythm (05/31 0400) Resp:  [11-27] 13 (05/31 0715) BP: (89-124)/(58-80) 109/69 (05/31 0700) SpO2:  [86 %-100 %] 100 % (05/31 0715) Arterial Line BP: (41-142)/(28-81) 122/58 (05/31 0715) Weight:  [98.4 kg] 98.4 kg (05/31 0500)  Hemodynamic parameters for last 24 hours: PAP: (41-54)/(12-24) 47/12 CVP:  [4 mmHg-15 mmHg] 9 mmHg  Intake/Output from previous day: 05/30 0701 - 05/31 0700 In: 1187.1 [I.V.:979.1; IV Piggyback:208] Out: 2315 [Urine:2315] Intake/Output this shift: No intake/output data recorded.  General appearance: alert and cooperative Neurologic: intact Heart: regular rate and rhythm, S1, S2 normal, no murmur Lungs: diminished breath sounds bibasilar Extremities: edema moderate Wound: incision ok  Lab Results: Recent Labs    11/05/21 1652 11/06/21 0420  WBC 13.7* 11.8*  HGB 8.2* 7.9*  HCT 23.6* 23.0*  PLT 133* 135*   BMET:  Recent Labs    11/05/21 0355 11/06/21 0420  NA 128* 131*  K 3.8 3.8  CL 95* 97*  CO2 25 27  GLUCOSE 141* 185*  BUN 44* 44*  CREATININE 1.16 1.00  CALCIUM 7.6* 7.4*    PT/INR:  Recent Labs    11/06/21 0420  LABPROT 23.3*  INR 2.1*   ABG    Component Value Date/Time   PHART 7.300 (L) 11/02/2021 0151   HCO3 23.7  11/02/2021 0151   TCO2 25 11/02/2021 0151   ACIDBASEDEF 3.0 (H) 11/02/2021 0151   O2SAT 63 11/06/2021 0420   CBG (last 3)  Recent Labs    11/06/21 0027 11/06/21 0421 11/06/21 0654  GLUCAP 123* 162* 119*    Assessment/Plan: S/P Procedure(s) (LRB): MITRAL VALVE (MV) REPLACEMENT USING 27MM MITRIS VALVE. (N/A) CORONARY ARTERY BYPASS GRAFTING (CABG) X TWO USING OPEN LEFT INTERNAL MAMMARY ARTERY AND ENDOSOPIC RIGHT GREATER SAPHENOUS VEIN HARVEST. (N/A) MAZE (N/A) TRANSESOPHAGEAL ECHOCARDIOGRAM (TEE) (N/A) CLIPPING OF ATRIAL APPENDAGE USING AN ATRICLIP PRO2 45MM (N/A)  POD 5  Hemodynamics stable on milrinone 0.25 and epi 5, NE. Weaning NE slowly as tolerated.   Volume excess: Recorded wt up 2 lbs but -1100 cc yesterday. Still needs diuresis. Will leave that up to Dr. Haroldine Laws.    Glucose under good control on SSI but he is taking more PO so will add Levemir.   INR decreased since taking po full liquids with Ensure. Will resume 2.5 mg Coumadin daily. ASA 81 mg for CAD.   Esophageal dysphagia possibly due to stricture. He says he has had this for many years but does not remember any workup. Full liquids seem to be going down ok. Plan esophagram and GI eval as he recovers.  IS, ambulation, PT/OT.   LOS: 13 days    Gaye Pollack 11/06/2021

## 2021-11-06 NOTE — Addendum Note (Signed)
Addendum  created 11/06/21 1624 by Oleta Mouse, MD   Clinical Note Signed, Intraprocedure Blocks edited

## 2021-11-06 NOTE — Progress Notes (Signed)
Occupational Therapy Treatment Patient Details Name: Keith Jenkins MRN: [id number] DOB: 1945-11-16 Today's Date: 11/06/2021   History of present illness Pt is a 76 y.o. male admitted 10/24/21 witih SOB and chest pain after recent STEMI. Workup for CHF, aflutter. S/p CABG, MVR 5/26. PMH includes pre-DM, HLD, arthritis, glaucoma.   OT comments  Patient continues to complain of generalized weakness, fatigue and mild dizziness, but continues to participate.  Very close to prior sessions with mobility, Min Guard and HHA of one.  Improvements noted with upper body ADL and able to figure four for attempted sock adjustment.  OT to continue efforts, and home with assist as needed is recommended.     Recommendations for follow up therapy are one component of a multi-disciplinary discharge planning process, led by the attending physician.  Recommendations may be updated based on patient status, additional functional criteria and insurance authorization.    Follow Up Recommendations  No OT follow up    Assistance Recommended at Discharge Frequent or constant Supervision/Assistance  Patient can return home with the following  A little help with walking and/or transfers;A little help with bathing/dressing/bathroom;Assistance with cooking/housework;Assist for transportation   Equipment Recommendations  None recommended by OT    Recommendations for Other Services      Precautions / Restrictions Precautions Precautions: Fall;Sternal Restrictions Other Position/Activity Restrictions: Sternal precautions       Mobility Bed Mobility Overal bed mobility: Needs Assistance Bed Mobility: Sit to Sidelying         Sit to sidelying: Min guard      Transfers Overall transfer level: Needs assistance Equipment used: 1 person hand held assist Transfers: Sit to/from Stand, Bed to chair/wheelchair/BSC Sit to Stand: Min assist, Min guard     Step pivot transfers: Min guard           Balance  Overall balance assessment: Needs assistance Sitting-balance support: No upper extremity supported, Feet supported Sitting balance-Leahy Scale: Good     Standing balance support: Single extremity supported Standing balance-Leahy Scale: Fair                                                                                                                                  Pertinent Vitals/ Pain       Pain Assessment Pain Assessment: Faces Faces Pain Scale: Hurts a little bit Pain Location: sternal incision, generalized Pain Descriptors / Indicators: Tender                                                          Frequency  Min 2X/week        Progress Toward Goals  OT Goals(current goals can now be found in the care plan section)  Progress towards OT goals: Progressing toward goals  Acute Rehab OT Goals OT Goal Formulation: With patient/family Time For Goal Achievement: 11/14/21 Potential to Achieve Goals: Good  Plan Discharge plan remains appropriate    Co-evaluation                 AM-PAC OT "6 Clicks" Daily Activity     Outcome Measure   Help from another person eating meals?: None Help from another person taking care of personal grooming?: A Little Help from another person toileting, which includes using toliet, bedpan, or urinal?: A Little Help from another person bathing (including washing, rinsing, drying)?: A Lot Help from another person to put on and taking off regular upper body clothing?: A Little Help from another person to put on and taking off regular lower body clothing?: A Lot 6 Click Score: 17    End of Session    OT Visit Diagnosis: Unsteadiness on feet (R26.81);Other abnormalities of gait and mobility (R26.89);Muscle weakness (generalized) (M62.81)   Activity Tolerance Patient limited by fatigue   Patient Left in bed;with call bell/phone within reach    Nurse Communication Mobility status        Time: 6606-3016 OT Time Calculation (min): 14 min  Charges: OT General Charges $OT Visit: 1 Visit OT Treatments $Self Care/Home Management : 8-22 mins  11/06/2021  RP, OTR/L  Acute Rehabilitation Services  Office:  [phone]   Suzanna Obey 11/06/2021, 8:54 AM

## 2021-11-06 NOTE — Progress Notes (Signed)
Patient ID: Keith Jenkins, male   DOB: 29-Nov-1945, 76 y.o.   MRN: [id number]  TCTS Evening Rounds:  Hemodynamically stable. Epi weaned to 3, milrinone on 0.25, NE 2.  Diuresing some today.  Ambulated a short lap.  Eating full liquids well. Can advance diet as he desires.

## 2021-11-06 NOTE — Progress Notes (Addendum)
Advanced Heart Failure Rounding Note  PCP-Cardiologist: None   Subjective:    05/19: Admit with a/c CHF. CO-OX okay but appeared low-output >>started milrinone 0.25 05/22: Developed A flutter. Started on amio drip.  05/26: S/p CABG x2 (LIMA-LAD, SVG-RAMUS INT) +MVR, MAZE, LAA Clip   Continues on 5 Epi, NE down to 2 yesterday >> up to 6 this am (after episode of orthostatic dizziness), 0.25 milrinone. CO-OX 63%.  MAP's variable (diastolic pressure low), currently > 70  SWAN out.  CVP 5-6 sitting up in chair.   2.3L UOP charted yesterday with IV lasix 40 BID. -1.1L for the day.  Maintaining SR on amiodarone gtt  Appetite continues to improve. Has already been up ambulating in hall. Had BM yesterday.   Objective:   Weight Range: 98.4 kg Body mass index is 28.62 kg/m.   Vital Signs:   Temp:  [97 F (36.1 C)-97.9 F (36.6 C)] 97.8 F (36.6 C) (05/31 0655) Pulse Rate:  [75-89] 86 (05/31 0600) Resp:  [11-27] 17 (05/31 0615) BP: (89-121)/(58-77) 92/69 (05/31 0600) SpO2:  [86 %-100 %] 96 % (05/31 0600) Arterial Line BP: (41-142)/(28-81) 122/72 (05/31 0615) Weight:  [98.4 kg] 98.4 kg (05/31 0500) Last BM Date : 11/05/21  Weight change: Filed Weights   11/02/21 0500 11/05/21 0500 11/06/21 0500  Weight: 91.4 kg 97.7 kg 98.4 kg    Intake/Output:   Intake/Output Summary (Last 24 hours) at 11/06/2021 0657 Last data filed at 11/06/2021 0600 Gross per 24 hour  Intake 1217.6 ml  Output 2315 ml  Net -1097.4 ml      Physical Exam   General:  Sitting up in chair. HEENT: normal Neck: supple. no JVD. Carotids 2+ bilat; no bruits.  Cor: PMI nondisplaced. Regular rate & rhythm. No rubs, gallops or murmurs. Sternum stable. Lungs: clear Abdomen: soft, nontender, nondistended. No hepatosplenomegaly.  Extremities: no cyanosis, clubbing, rash, 1-2 + LE edema, + RUE PICC, L radial Aline Neuro: alert & orientedx3, cranial nerves grossly intact. moves all 4 extremities w/o  difficulty. Affect pleasant     Telemetry   SR 80s, < 5 PVCs/min (personally reviewed)   Labs    CBC Recent Labs    11/05/21 1652 11/06/21 0420  WBC 13.7* 11.8*  HGB 8.2* 7.9*  HCT 23.6* 23.0*  MCV 85.8 85.8  PLT 133* [id number]*   Basic Metabolic Panel Recent Labs    11/04/21 0326 11/05/21 0355 11/06/21 0420  NA 130* 128* 131*  K 4.2 3.8 3.8  CL 95* 95* 97*  CO2 27 25 27   GLUCOSE 139* 141* 185*  BUN 40* 44* 44*  CREATININE 1.29* 1.16 1.00  CALCIUM 7.7* 7.6* 7.4*  MG 2.5*  --  2.4   Liver Function Tests Recent Labs    11/04/21 0326  AST 63*  ALT 38  ALKPHOS 89  BILITOT 0.8  PROT 5.1*  ALBUMIN 2.5*   No results for input(s): LIPASE, AMYLASE in the last 72 hours. Cardiac Enzymes No results for input(s): CKTOTAL, CKMB, CKMBINDEX, TROPONINI in the last 72 hours.  BNP: BNP (last 3 results) Recent Labs    10/10/21 1606 10/22/21 1623 10/24/21 2150  BNP 1,564.6* 1,226.7* 1,306.1*    ProBNP (last 3 results) No results for input(s): PROBNP in the last 8760 hours.   D-Dimer No results for input(s): DDIMER in the last 72 hours. Hemoglobin A1C No results for input(s): HGBA1C in the last 72 hours.  Fasting Lipid Panel No results for input(s): CHOL, HDL, LDLCALC, TRIG, CHOLHDL,  LDLDIRECT in the last 72 hours. Thyroid Function Tests No results for input(s): TSH, T4TOTAL, T3FREE, THYROIDAB in the last 72 hours.  Invalid input(s): FREET3  Other results:   Imaging    No results found.   Medications:     Scheduled Medications:  acetaminophen (TYLENOL) oral liquid 160 mg/5 mL  1,000 mg Oral Q6H   Or   acetaminophen  1,000 mg Oral Q6H   aspirin  325 mg Oral Daily   Or   aspirin EC  325 mg Oral Daily   atorvastatin  80 mg Oral Daily   bisacodyl  10 mg Oral Daily   Or   bisacodyl  10 mg Rectal Daily   Chlorhexidine Gluconate Cloth  6 each Topical Q0600   docusate  200 mg Oral BID   escitalopram  10 mg Oral Daily   feeding supplement  237 mL  Oral TID WC & HS   insulin aspart  0-24 Units Subcutaneous Q4H   latanoprost  1 drop Both Eyes QHS   lidocaine  2 patch Transdermal Q24H   multivitamin  15 mL Oral Daily   pantoprazole (PROTONIX) IV  40 mg Intravenous Q12H   sodium chloride flush  10-40 mL Intracatheter Q12H   sodium chloride flush  3 mL Intravenous Q12H   Warfarin - Physician Dosing Inpatient   Does not apply q1600    Infusions:  sodium chloride Stopped (11/05/21 1529)   sodium chloride     sodium chloride     amiodarone 30 mg/hr (11/06/21 0600)   epinephrine 5 mcg/min (11/06/21 0600)   lactated ringers     milrinone 0.25 mcg/kg/min (11/06/21 0655)   nitroGLYCERIN     norepinephrine (LEVOPHED) Adult infusion 4 mcg/min (11/06/21 0600)    PRN Medications: sodium chloride, morphine injection, ondansetron (ZOFRAN) IV, oxyCODONE, sodium chloride flush, sodium chloride flush, traMADol    Patient Profile   76 y.o. male with hx COPD, prediabetes, HLD, GERD. Recent admit earlier this month with later presentation inferior STEMI and new systolic CHF w/ moderate to severe ischemic MR.   Readmitted with a/c CHF. Optimized from HF standpoint. Underwent CABG w MVR, MAZE and LAA clip 5/26.   Assessment/Plan   1. CAD/recent late presentation inferior STEMI - LHC 05/04: 100% m RCA and 90% m LAD.  - RCA managed medically d/t completed infarct.  - Echo (5/23) EF 25%, RV moderately reduced, moderate to severe MR - 5/26: S/p CABG x 2 (LIMA-LAD, SVG-[place] INT)   - ASA + high intensity statin  - No s/s angina   2. Acute on chronic systolic CHF with low-output/ICM - s/p late presentation inferior STEMI 05/23 - Echo 05/23 EF 25-30%, RV moderately reduced, moderate to severe MR. - Limited echo on admit EF 30-35%, RV moderately reduced, MR not well evaluated, moderate pericardial effusion - S/p CABG + MVR 5/26 - on Milrinone 0.25, NE 6, Epi 5. CO-OX 61%. - Continue to wean NE slowly. Aim for SBP > 100 instead of MAP goal  (diastolic pressure low) - CVP 5-6 sitting up in chair. Legs edematous and weight up. Give IV lasix 60 once today - Given age, fraility and reduced LV function suspect may be slow inotrope wean.   3. Mitral regurgitation - Moderate to severe ischemic MR on TEE.  - S/p tissue MVR 11/02/21 - On low-dose warfarin per TCTS. Held d/t elevated INR, now down to 2.1.   4. Aortic valve insufficiency - Mild to moderate AS/AI - stable.    5.  Pericardial effusion/Acute Pericarditis  - Moderate in size on echo, no respiratory variation or RV collapse to suggest tamponade - acute pericarditis/ adhensions noted at time of CABG - CTs out   6. H/o AKI - Scr up slightly 1.10 > 1.4, now back to baseline. 1.0 today. - Watch renal function closely    7. Prediabetes - A1c 5.8 1/23 - holding [place] for now   8. PAF - s/p MAZE w/ CABG  - continue amio gtt while on inotrope  - warfarin started, now held d/t supratherapeutic INR. Down to 2.1 today.  9. Hyponatremia  - Na 133 > 131 > 130 > 128 > 131 today  10. Post-op ileus - also has h/o esophageal stricture (was unable to pass TEE probe transgastric prior to CABG) - Had bowel movement. Continue Reglan and bowel regimen.  - appetite improving - continue to mobilize  11. Post-op anemia - Hgb 10.4>9.2>7.9>8.2>7.9 - Follow   Continue to mobilize. Aggressive PT/OT.   Length of Stay: [address], [place], PA-C  11/06/2021, 6:57 AM  Advanced Heart Failure Team Pager [phone] (M-F; 7a - 5p)  Please contact [facility] for night-coverage after hours (5p -7a ) and weekends on [email or web address]  Agree with above.   Remains on milrinone 0.25. NE 4 Epi 5 Co-0x 63% CVP 5-6  Feeling better. Denies SOB. Chest still a little sore. Appetite improving. + BM  Renal function stable INR 2.1  General:  Sitting up No resp difficulty HEENT: normal Neck: supple. no JVD. Carotids 2+ bilat; no bruits. No lymphadenopathy or thryomegaly appreciated. Cor: PMI  nondisplaced.  Sternal wound ok Regular rate & rhythm. No rubs, gallops or murmurs. Lungs: clear Abdomen: soft, nontender, + distended. No hepatosplenomegaly. No bruits or masses. Good bowel sounds. Extremities: no cyanosis, clubbing, rash, 2+ edema Neuro: alert & orientedx3, cranial nerves grossly intact. moves all 4 extremities w/o difficulty. Affect pleasant  Continue to wean pressors. Give 1 dose IV lasix today. Place TED hose. Continue to ambulate. Add digoxin.   D/w Dr. Cyndia Bent at bedside.   CRITICAL CARE Performed by: Glori Bickers  Total critical care time: 35 minutes  Critical care time was exclusive of separately billable procedures and treating other patients.  Critical care was necessary to treat or prevent imminent or life-threatening deterioration.  Critical care was time spent personally by me (independent of midlevel providers or residents) on the following activities: development of treatment plan with patient and/or surrogate as well as nursing, discussions with consultants, evaluation of patient's response to treatment, examination of patient, obtaining history from patient or surrogate, ordering and performing treatments and interventions, ordering and review of laboratory studies, ordering and review of radiographic studies, pulse oximetry and re-evaluation of patient's condition.  Glori Bickers, MD  8:21 AM

## 2021-11-07 ENCOUNTER — Inpatient Hospital Stay (HOSPITAL_COMMUNITY): Payer: Medicare HMO

## 2021-11-07 DIAGNOSIS — R1319 Other dysphagia: Secondary | ICD-10-CM | POA: Diagnosis not present

## 2021-11-07 DIAGNOSIS — I5043 Acute on chronic combined systolic (congestive) and diastolic (congestive) heart failure: Secondary | ICD-10-CM | POA: Diagnosis not present

## 2021-11-07 LAB — GLUCOSE, CAPILLARY
Glucose-Capillary: 83 mg/dL (ref 70–99)
Glucose-Capillary: 132 mg/dL — ABNORMAL HIGH (ref 70–99)
Glucose-Capillary: 93 mg/dL (ref 70–99)
Glucose-Capillary: 142 mg/dL — ABNORMAL HIGH (ref 70–99)
Glucose-Capillary: 105 mg/dL — ABNORMAL HIGH (ref 70–99)

## 2021-11-07 LAB — BASIC METABOLIC PANEL
Anion gap: 5 (ref 5–15)
BUN: 41 mg/dL — ABNORMAL HIGH (ref 8–23)
CO2: 27 mmol/L (ref 22–32)
Calcium: 7.5 mg/dL — ABNORMAL LOW (ref 8.9–10.3)
Chloride: 96 mmol/L — ABNORMAL LOW (ref 98–111)
Creatinine, Ser: 1.04 mg/dL (ref 0.61–1.24)
GFR, Estimated: >60 mL/min (ref >60)
Glucose, Bld: 210 mg/dL — ABNORMAL HIGH (ref 70–99)
Potassium: 4.1 mmol/L (ref 3.5–5.1)
Sodium: 128 mmol/L — ABNORMAL LOW (ref 135–145)

## 2021-11-07 LAB — CBC
HCT: 25.2 % — ABNORMAL LOW (ref 39.0–52.0)
Hemoglobin: 8.3 g/dL — ABNORMAL LOW (ref 13.0–17.0)
MCH: 28.6 pg (ref 26.0–34.0)
MCHC: 32.9 g/dL (ref 30.0–36.0)
MCV: 86.9 fL (ref 80.0–100.0)
Platelets: 176 10*3/uL (ref 150–400)
RBC: 2.9 MIL/uL — ABNORMAL LOW (ref 4.22–5.81)
RDW: 13.9 % (ref 11.5–15.5)
WBC: 10.2 10*3/uL (ref 4.0–10.5)
nRBC: 1.4 % — ABNORMAL HIGH (ref 0.0–0.2)

## 2021-11-07 LAB — PROTIME-INR
INR: 1.7 — ABNORMAL HIGH (ref 0.8–1.2)
Prothrombin Time: 19.9 seconds — ABNORMAL HIGH (ref 11.4–15.2)

## 2021-11-07 LAB — COOXEMETRY PANEL
Carboxyhemoglobin: 1.6 % — ABNORMAL HIGH (ref 0.5–1.5)
Methemoglobin: 0.9 % (ref 0.0–1.5)
O2 Saturation: 70.2 %
Total hemoglobin: 7.4 g/dL — ABNORMAL LOW (ref 12.0–16.0)

## 2021-11-07 MED ORDER — METOLAZONE 2.5 MG PO TABS
2.5000 mg | ORAL_TABLET | Freq: Once | ORAL | Status: AC
  Administered 2021-11-07: 2.5 mg via ORAL

## 2021-11-07 MED ORDER — FUROSEMIDE 10 MG/ML IJ SOLN
60.0000 mg | Freq: Two times a day (BID) | INTRAMUSCULAR | Status: AC
Start: 2021-11-07 — End: 2021-11-07
  Administered 2021-11-07 (×2): 60 mg via INTRAVENOUS
  Filled 2021-11-07 (×2): qty 6

## 2021-11-07 MED ORDER — MIDODRINE HCL 5 MG PO TABS
5.0000 mg | ORAL_TABLET | Freq: Three times a day (TID) | ORAL | Status: DC
  Filled 2021-11-07: qty 1

## 2021-11-07 MED ORDER — POTASSIUM CHLORIDE CRYS ER 20 MEQ PO TBCR: 40.0000 meq | EXTENDED_RELEASE_TABLET | Freq: Once | ORAL | Status: DC

## 2021-11-07 MED ORDER — METOLAZONE 5 MG PO TABS
5.0000 mg | ORAL_TABLET | Freq: Once | ORAL | Status: DC
  Filled 2021-11-07: qty 1

## 2021-11-07 MED ORDER — PANTOPRAZOLE SODIUM 40 MG IV SOLR
40.0000 mg | Freq: Two times a day (BID) | INTRAVENOUS | Status: DC
  Administered 2021-11-07 – 2021-11-10 (×8): 40 mg via INTRAVENOUS
  Filled 2021-11-07 (×8): qty 10

## 2021-11-07 MED ORDER — PANTOPRAZOLE SODIUM 40 MG PO TBEC: 40.0000 mg | DELAYED_RELEASE_TABLET | Freq: Two times a day (BID) | ORAL | Status: DC

## 2021-11-07 MED ORDER — AMIODARONE HCL 200 MG PO TABS
200.0000 mg | ORAL_TABLET | Freq: Two times a day (BID) | ORAL | Status: DC
  Administered 2021-11-07 – 2021-11-17 (×22): 200 mg via ORAL
  Filled 2021-11-07 (×22): qty 1

## 2021-11-07 MED ORDER — FUROSEMIDE 10 MG/ML IJ SOLN: 60.0000 mg | Freq: Two times a day (BID) | INTRAMUSCULAR | Status: DC

## 2021-11-07 MED ORDER — INSULIN DETEMIR 100 UNIT/ML ~~LOC~~ SOLN
25.0000 [IU] | Freq: Every day | SUBCUTANEOUS | Status: DC
  Administered 2021-11-07 – 2021-11-18 (×12): 25 [IU] via SUBCUTANEOUS
  Filled 2021-11-07 (×12): qty 0.25

## 2021-11-07 MED ORDER — POTASSIUM CHLORIDE 20 MEQ PO PACK
40.0000 meq | PACK | Freq: Once | ORAL | Status: AC
  Administered 2021-11-07: 40 meq via ORAL
  Filled 2021-11-07: qty 2

## 2021-11-07 MED ORDER — AMIODARONE HCL IN DEXTROSE 360-4.14 MG/200ML-% IV SOLN
INTRAVENOUS | Status: AC
  Administered 2021-11-07: 30 mg/h via INTRAVENOUS
  Filled 2021-11-07: qty 200
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
## 2021-11-07 NOTE — Progress Notes (Addendum)
Advanced Heart Failure Rounding Note  PCP-Cardiologist: None   Subjective:    05/19: Admit with a/c CHF. CO-OX okay but appeared low-output >>started milrinone 0.25 05/22: Developed A flutter. Started on amio drip.  05/26: S/p CABG x2 (LIMA-LAD, SVG-RAMUS INT) +MVR, MAZE, LAA Clip   Epi weaned off, NE down to 2, continues on milrinone 0.25. CO-OX 70%  CVP 10-11  MAPs 60s -70s, SBP soft 80s-90s  Received 60 mg lasix IV yesterday. 1.1L UOP charted. Weight unchanged.  Scr 1.04, Na 128, K 4.1  Sitting up in bed eating ice cream. Walked a lap around the unit yesterday. No dyspnea or CP.    Objective:   Weight Range: 98.4 kg Body mass index is 28.62 kg/m.   Vital Signs:   Temp:  [97.7 F (36.5 C)-98 F (36.7 C)] 97.7 F (36.5 C) (06/01 0655) Pulse Rate:  [71-97] 71 (06/01 0600) Resp:  [12-30] 16 (06/01 0600) BP: (81-115)/(53-74) 93/62 (06/01 0600) SpO2:  [93 %-100 %] 93 % (06/01 0600) Arterial Line BP: (89-132)/(43-66) 101/52 (05/31 1445) Weight:  [98.4 kg] 98.4 kg (06/01 0500) Last BM Date : 11/05/21  Weight change: Filed Weights   11/05/21 0500 11/06/21 0500 11/07/21 0500  Weight: 97.7 kg 98.4 kg 98.4 kg    Intake/Output:   Intake/Output Summary (Last 24 hours) at 11/07/2021 0700 Last data filed at 11/07/2021 0600 Gross per 24 hour  Intake 887.51 ml  Output 1150 ml  Net -262.49 ml      Physical Exam   General:  No distress. Sitting up in bed. HEENT: normal Neck: supple. JVP 10-12. Carotids 2+ bilat; no bruits.  Cor: PMI nondisplaced. Regular rate & rhythm. No rubs, gallops or murmurs. Sternum stable. Lungs: clear Abdomen: soft, nontender, nondistended. No hepatosplenomegaly.  Extremities: no cyanosis, clubbing, rash, 2+ edema, TED hose on, + RUE PICC Neuro: alert & orientedx3, cranial nerves grossly intact. moves all 4 extremities w/o difficulty. Affect pleasant     Telemetry   SR 70s (personally reviewed)   Labs    CBC Recent Labs     11/05/21 1652 11/06/21 0420  WBC 13.7* 11.8*  HGB 8.2* 7.9*  HCT 23.6* 23.0*  MCV 85.8 85.8  PLT 133* [id number]*   Basic Metabolic Panel Recent Labs    11/06/21 0420 11/07/21 0500  NA 131* 128*  K 3.8 4.1  CL 97* 96*  CO2 27 27  GLUCOSE 185* 210*  BUN 44* 41*  CREATININE 1.00 1.04  CALCIUM 7.4* 7.5*  MG 2.4  --    Liver Function Tests No results for input(s): AST, ALT, ALKPHOS, BILITOT, PROT, ALBUMIN in the last 72 hours.  No results for input(s): LIPASE, AMYLASE in the last 72 hours. Cardiac Enzymes No results for input(s): CKTOTAL, CKMB, CKMBINDEX, TROPONINI in the last 72 hours.  BNP: BNP (last 3 results) Recent Labs    10/10/21 1606 10/22/21 1623 10/24/21 2150  BNP 1,564.6* 1,226.7* 1,306.1*    ProBNP (last 3 results) No results for input(s): PROBNP in the last 8760 hours.   D-Dimer No results for input(s): DDIMER in the last 72 hours. Hemoglobin A1C No results for input(s): HGBA1C in the last 72 hours.  Fasting Lipid Panel No results for input(s): CHOL, HDL, LDLCALC, TRIG, CHOLHDL, LDLDIRECT in the last 72 hours. Thyroid Function Tests No results for input(s): TSH, T4TOTAL, T3FREE, THYROIDAB in the last 72 hours.  Invalid input(s): FREET3  Other results:   Imaging    No results found.   Medications:  Scheduled Medications:  aspirin  81 mg Oral Daily   atorvastatin  80 mg Oral Daily   bisacodyl  10 mg Oral Daily   Or   bisacodyl  10 mg Rectal Daily   Chlorhexidine Gluconate Cloth  6 each Topical Q0600   digoxin  0.125 mg Oral Daily   docusate  200 mg Oral BID   escitalopram  10 mg Oral Daily   feeding supplement  237 mL Oral TID WC & HS   insulin aspart  0-24 Units Subcutaneous TID WC   insulin detemir  20 Units Subcutaneous Daily   latanoprost  1 drop Both Eyes QHS   lidocaine  2 patch Transdermal Q24H   multivitamin  15 mL Oral Daily   pantoprazole (PROTONIX) IV  40 mg Intravenous Q12H   sodium chloride flush  10-40 mL  Intracatheter Q12H   sodium chloride flush  3 mL Intravenous Q12H   warfarin  2.5 mg Oral q1600   Warfarin - Physician Dosing Inpatient   Does not apply q1600    Infusions:  sodium chloride     amiodarone 30 mg/hr (11/07/21 0600)   epinephrine Stopped (11/06/21 2121)   lactated ringers     milrinone 0.25 mcg/kg/min (11/07/21 0600)   norepinephrine (LEVOPHED) Adult infusion 2 mcg/min (11/07/21 0600)    PRN Medications: morphine injection, ondansetron (ZOFRAN) IV, oxyCODONE, sodium chloride flush, sodium chloride flush, traMADol    Patient Profile   76 y.o. male with hx COPD, prediabetes, HLD, GERD. Recent admit earlier this month with later presentation inferior STEMI and new systolic CHF w/ moderate to severe ischemic MR.   Readmitted with a/c CHF. Optimized from HF standpoint. Underwent CABG w MVR, MAZE and LAA clip 5/26.   Assessment/Plan   1. CAD/recent late presentation inferior STEMI - LHC 05/04: 100% m RCA and 90% m LAD.  - RCA managed medically d/t completed infarct.  - Echo (5/23) EF 25%, RV moderately reduced, moderate to severe MR - 5/26: S/p CABG x 2 (LIMA-LAD, SVG-[place] INT)   - ASA + high intensity statin  - No s/s angina   2. Acute on chronic systolic CHF with low-output/ICM - s/p late presentation inferior STEMI 05/23 - Echo 05/23 EF 25-30%, RV moderately reduced, moderate to severe MR. - Limited echo on admit EF 30-35%, RV moderately reduced, MR not well evaluated, moderate pericardial effusion - S/p CABG + MVR 5/26 - on Milrinone 0.25, NE 2, Epi off. CO-OX stable at 70% - BP soft, difficulty weaning off NE. Add midodrine 5 TID to facilitate NE wean.  - CVP 10-11. Give IV lasix 60 BID. - Continue digoxin 0.125 - Eventually add back GDMT as blood pressure allows - Given age, fraility and reduced LV function suspect may be slow inotrope wean.   3. Mitral regurgitation - Moderate to severe ischemic MR on TEE.  - S/p tissue MVR 11/02/21 - On low-dose  warfarin per TCTS. INR 1.7 today.    4. Aortic valve insufficiency - Mild to moderate AS/AI - stable.    5. Pericardial effusion/Acute Pericarditis  - Moderate in size on echo, no respiratory variation or RV collapse to suggest tamponade - acute pericarditis/ adhensions noted at time of CABG - CTs out   6. H/o AKI - Scr up slightly 1.10 > 1.4, now back to baseline.  - Watch renal function closely    7. Prediabetes - A1c 5.8 1/23 - holding [place] for now   8. PAF - s/p MAZE w/ CABG  -  maintaining SR. continue amio gtt while on inotrope support - warfarin started, now held d/t supratherapeutic INR. Down to 2.1 today.  9. Hyponatremia  - Na 133 > 131 > 130 > 128 > 131 > 128 today - Fluid restrict.  10. Post-op ileus - also has h/o esophageal stricture (was unable to pass TEE probe transgastric prior to CABG) - Having bowel movements. Continue Reglan and bowel regimen.  - appetite improving - continue to mobilize  11. Post-op anemia - Hgb 10.4>9.2>7.9>8.2>7.9, CBC today - Follow  Continue to mobilize. Aggressive PT/OT. Cardiac rehab.   Length of Stay: [place], [place], PA-C  11/07/2021, 7:00 AM  Advanced Heart Failure Team Pager [phone] (M-F; 7a - 5p)  Please contact [facility] for night-coverage after hours (5p -7a ) and weekends on [email or web address]  Agree with above.   Now off EPI. On NE 2 and milrinone 0.25. BPs soft. Co-ox 70%  Remains volume overloaded.   General:  Sitting up in bed No resp difficulty HEENT: normal Neck: supple. no JVD. Carotids 2+ bilat; no bruits. No lymphadenopathy or thryomegaly appreciated. Cor: PMI nondisplaced. Regular rate & rhythm. No rubs, gallops or murmurs. Lungs: clear Abdomen: soft, nontender, nondistended. No hepatosplenomegaly. No bruits or masses. Good bowel sounds. Extremities: no cyanosis, clubbing, rash, 2+ edema Neuro: alert & orientedx3, cranial nerves grossly intact. moves all 4 extremities w/o difficulty.  Affect pleasant  Given low EF would like to avoid midodrine if possible for now. Epi now off. Will use NE for now to support BP and cardiac output. Wean NE slowly to keep SBP >= 100. Wean milrinone to 0.125. Continue IV lasix. Add metolazone. Ambulate. Esophogram ordered.  CRITICAL CARE Performed by: Glori Bickers  Total critical care time: 35 minutes  Critical care time was exclusive of separately billable procedures and treating other patients.  Critical care was necessary to treat or prevent imminent or life-threatening deterioration.  Critical care was time spent personally by me (independent of midlevel providers or residents) on the following activities: development of treatment plan with patient and/or surrogate as well as nursing, discussions with consultants, evaluation of patient's response to treatment, examination of patient, obtaining history from patient or surrogate, ordering and performing treatments and interventions, ordering and review of laboratory studies, ordering and review of radiographic studies, pulse oximetry and re-evaluation of patient's condition.  Glori Bickers, MD  8:16 AM

## 2021-11-07 NOTE — Progress Notes (Signed)
6 Days Post-Op Procedure(s) (LRB): MITRAL VALVE (MV) REPLACEMENT USING MITRIS VALVE. (N/A) CORONARY ARTERY BYPASS GRAFTING (CABG) X TWO USING OPEN LEFT INTERNAL MAMMARY ARTERY AND ENDOSOPIC RIGHT GREATER SAPHENOUS VEIN HARVEST. (N/A) MAZE (N/A) TRANSESOPHAGEAL ECHOCARDIOGRAM (TEE) (N/A) CLIPPING OF ATRIAL APPENDAGE USING AN ATRICLIP PRO2 (N/A) Subjective: Feels well. Slept some. Has not ambulated this am yet. Eating some ice cream for breakfast. Taking Ensure without difficulty.  Objective: Vital signs in last 24 hours: Temp:  [97.7 F (36.5 C)-98 F (36.7 C)] 97.7 F (36.5 C) (06/01 0655) Pulse Rate:  [71-97] 71 (06/01 0600) Cardiac Rhythm: Normal sinus rhythm (05/31 1930) Resp:  [12-30] 16 (06/01 0600) BP: (81-115)/(53-74) 93/62 (06/01 0600) SpO2:  [93 %-100 %] 93 % (06/01 0600) Arterial Line BP: (89-132)/(43-66) 101/52 (05/31 1445) Weight:  [98.4 kg] 98.4 kg (06/01 0500)  Hemodynamic parameters for last 24 hours: CVP:  [7 mmHg-77 mmHg] 10 mmHg  Intake/Output from previous day: 05/31 0701 - 06/01 0700 In: 887.5 [P.O.:120; I.V.:767.5] Out: 1150 [Urine:1150] Intake/Output this shift: No intake/output data recorded.  General appearance: alert and cooperative Neurologic: intact Heart: regular rate and rhythm, S1, S2 normal, no murmur Lungs: clear to auscultation bilaterally Extremities: edema mild Wound: incision ok  Lab Results: Recent Labs    11/05/21 1652 11/06/21 0420  WBC 13.7* 11.8*  HGB 8.2* 7.9*  HCT 23.6* 23.0*  PLT 133* 135*   BMET:  Recent Labs    11/06/21 0420 11/07/21 0500  NA 131* 128*  K 3.8 4.1  CL 97* 96*  CO2 27 27  GLUCOSE 185* 210*  BUN 44* 41*  CREATININE 1.00 1.04  CALCIUM 7.4* 7.5*    PT/INR:  Recent Labs    11/07/21 0500  LABPROT 19.9*  INR 1.7*   ABG    Component Value Date/Time   PHART 7.300 (L) 11/02/2021 0151   HCO3 23.7 11/02/2021 0151   TCO2 25 11/02/2021 0151   ACIDBASEDEF 3.0 (H) 11/02/2021 0151    O2SAT 70.2 11/07/2021 0530   CBG (last 3)  Recent Labs    11/06/21 1602 11/06/21 2134 11/07/21 0654  GLUCAP 102* 160* 83    Assessment/Plan: S/P Procedure(s) (LRB): MITRAL VALVE (MV) REPLACEMENT USING MITRIS VALVE. (N/A) CORONARY ARTERY BYPASS GRAFTING (CABG) X TWO USING OPEN LEFT INTERNAL MAMMARY ARTERY AND ENDOSOPIC RIGHT GREATER SAPHENOUS VEIN HARVEST. (N/A) MAZE (N/A) TRANSESOPHAGEAL ECHOCARDIOGRAM (TEE) (N/A) CLIPPING OF ATRIAL APPENDAGE USING AN ATRICLIP PRO2 (N/A)  POD 6  BP is borderline with MAP 65-70, systolic 88-95 off epi now on milrinone 0.25 and NE 2. Co-ox 70.2. CVP 10-13. Hgb 7.9 yesterday which may affect his BP, maybe some vasodilation from milrinone.  I/O -262 yesterday, wt stable. 12 lbs over preop. Hard to diurese further with marginal BP.  DM: glucose still a little high during the day likely related to ice cream, Ensure etc. Will increase Levemir to 25.  Will order esophagram to evaluate for stricture.  INR 1.7. Continue Coumadin 2.5 daily for now.  Continue IS, ambulation, PT/OT.     LOS: 14 days    [name] 11/07/2021

## 2021-11-07 NOTE — Progress Notes (Signed)
Nutrition Follow-up  DOCUMENTATION CODES:   Not applicable  INTERVENTION:   Pt will need to rely on nutrition supplements (Ensure and Magic Cup) to meet nutritional needs while on FL diet  Ensure Enlive po QID, each supplement provides 350 kcal and 20 grams of protein.  Magic cup TID with meals, each supplement provides 290 kcal and 9 grams of protein  Continue Liquid MVI  RD to order Calorie Count to start tomorrow through the weekend. If pt unable to meet nutritional needs, recommend nutrition support-Cortrak vs TPN. Cortrak if feasible, although may be challenging given inability to pass TEE probe past stricture.   NUTRITION DIAGNOSIS:   Increased nutrient needs related to chronic illness (COPD, CHF) as evidenced by estimated needs.  Being addressed via supplements  GOAL:   Patient will meet greater than or equal to 90% of their needs  Not met but being addressed via supplements  MONITOR:   PO intake, Supplement acceptance, Labs, Weight trends, Skin, I & O's  REASON FOR ASSESSMENT:   Malnutrition Screening Tool    ASSESSMENT:   76 y.o. male with  a history of COPD, prediabetes, GERD, hyperlipidemia who presents for the evaluation of chest pain. Pt with acute combined CHF/ICM.  5/18 Admitted 5/26 S/p CABG x2 (LIMA-LAD, SVG-RAMUS INT) +MVR, MAZE, LAA Clip  6/01 Barium swallow done today shows a substantial stricture in the distal 2.2 cm of the esophagus with narrowing down to a diameter of 6 mm, mostly smooth but merits endoscopic assessment, small outpouching of contrast along the GE junction cannot exclude a small ulcer  Noted GI consult pending to evaluate stricture  Pt remains on FL diet. Pt drinking some Ensure, also taking some Magic Cup. Noted concern that pt is regurgitating, spitting up meds/po at times per RN. Pt has previously expressed that soft solids, even scrambled eggs, have not been tolerated acutely.   If unable to advance past FL diet post  esophagram, need to order calorie count to see if pt will be able to meet nutritional needs on FL diet. Pt will have to rely heavily on nutrition supplements.   Now with newly documented DTI to buttocks  Labs: sodium 128 (L)-corrected sodium 131, serum glucose 210, Creatinine wdl Meds: lasix, ss novolog, levemir, colace, KCl, MVI liquid   Diet Order:   Diet Order             Diet full liquid Room service appropriate? Yes; Fluid consistency: Thin  Diet effective now                   EDUCATION NEEDS:   Not appropriate for education at this time  Skin:  Skin Assessment: Skin Integrity Issues: Skin Integrity Issues:: DTI DTI: buttocks  Last BM:  5/30  Height:   Ht Readings from Last 1 Encounters:  10/24/21 _0  (1.854 m)    Weight:   Wt Readings from Last 1 Encounters:  11/07/21 98.4 kg     BMI:  Body mass index is 28.62 kg/m.  Estimated Nutritional Needs:   Kcal:  3888-2800  Protein:  115-125 grams  Fluid:  >/= 2 L/day   Kerman Passey MS, RDN, LDN, CNSC Registered Dietitian III Clinical Nutrition RD Pager and On-Call Pager Number Located in [place]

## 2021-11-07 NOTE — Progress Notes (Signed)
      [address]       [place] [address]             [phone]      POD # 6 MVR, CABG, Maze  No complaints at present  BP 95/66   Pulse 71   Temp 97.7 F (36.5 C) (Oral)   Resp 17   Ht 6\' 1"  (1.854 m)   Wt 98.4 kg   SpO2 98%   BMI 28.62 kg/m  CVP = 11 Milrinone 0.125 Norepi 2   Intake/Output Summary (Last 24 hours) at 11/07/2021 1903 Last data filed at 11/07/2021 1800 Gross per 24 hour  Intake 1404.09 ml  Output 1350 ml  Net 54.09 ml   CBG 142  Stable day so far Continue current care  Lyndel Sarate C. 01/07/2022, MD [facility] [phone]

## 2021-11-07 NOTE — Progress Notes (Signed)
   GI consult requested per Dr Gala Romney for esophageal stricture noted on barium swallow.      Verdon Ferrante NP-C  4:15 PM

## 2021-11-07 NOTE — Progress Notes (Signed)
Physical Therapy Treatment Patient Details Name: Keith Jenkins MRN: [id number] DOB: 05-20-1946 Today's Date: 11/07/2021   History of Present Illness Pt is a 76 y.o. male admitted 10/24/21 witih SOB and chest pain after recent STEMI. Workup for CHF, aflutter. S/p CABG, MVR 5/26. PMH includes pre-DM, HLD, arthritis, glaucoma.   PT Comments    Pt progressing with mobility. Today's session focused on gait training with rollator, ambulation for improving strength and activity tolerance. Pt tolerated ~230' ambulation distance with prolonged seated rest break halfway; pt continues to require max encouragement to progress mobility, but ultimately agreeable. Pt remains limited by generalized weakness, decreased activity tolerance, and impaired balance strategies/postural reactions. Will continue to follow acutely to address established goals.    Recommendations for follow up therapy are one component of a multi-disciplinary discharge planning process, led by the attending physician.  Recommendations may be updated based on patient status, additional functional criteria and insurance authorization.  Follow Up Recommendations  Home health PT     Assistance Recommended at Discharge Intermittent Supervision/Assistance  Patient can return home with the following A little help with walking and/or transfers;A little help with bathing/dressing/bathroom;Assistance with cooking/housework;Assist for transportation;Help with stairs or ramp for entrance   Equipment Recommendations  TBD pending progression - Rollator (4 wheels)     Recommendations for Other Services       Precautions / Restrictions Precautions Precautions: Fall;Sternal     Mobility  Bed Mobility Overal bed mobility: Needs Assistance Bed Mobility: Supine to Sit     Supine to sit: Supervision, HOB elevated     General bed mobility comments: good ability to maintain sternal precautions scooting to EOB, prolonged seated rest break prior to  standing    Transfers Overall transfer level: Needs assistance Equipment used: Rollator (4 wheels) Transfers: Sit to/from Stand             General transfer comment: initial stand from EOB to rollator with min guard, cues for sequencing to maintain sternal precautions; additional stand from rollator seat requiring minA for trunk elevation and stability; significant increased time 'preparing' to stand requiring repeated encouragement to complete task    Ambulation/Gait Ambulation/Gait assistance: Min guard Gait Distance (Feet): 110 Feet (+ 120) Assistive device: Rollator (4 wheels) Gait Pattern/deviations: Step-to pattern, Step-through pattern, Decreased stride length, Trunk flexed Gait velocity: Decreased     General Gait Details: slow, fatigued gait with rollator and min guard for balance; max encouragement to increase ambulation distance, 1x prolonged seated rest break secondary to c/o fatigue   Stairs             Wheelchair Mobility    Modified Rankin (Stroke Patients Only)       Balance Overall balance assessment: Needs assistance Sitting-balance support: No upper extremity supported, Feet supported Sitting balance-Leahy Scale: Good       Standing balance-Leahy Scale: Fair Standing balance comment: can static stand and take steps without UE support, min guard                            Cognition Arousal/Alertness: Awake/alert Behavior During Therapy: WFL for tasks assessed/performed, Flat affect Overall Cognitive Status: Within Functional Limits for tasks assessed                                 General Comments: WFL for simple tasks, though clearly fatigued requiring encouragement to complete tasks  Exercises      General Comments General comments (skin integrity, edema, etc.): SpO2 94-99% on RA, HR 84. educ on importance of mobility progression, activity recommendations      Pertinent Vitals/Pain Pain  Assessment Pain Assessment: Faces Faces Pain Scale: Hurts little more Pain Location: sternal incision, generalized Pain Descriptors / Indicators: Tiring, Discomfort Pain Intervention(s): Monitored during session, Limited activity within patient's tolerance    Home Living                          Prior Function            PT Goals (current goals can now be found in the care plan section) Progress towards PT goals: Progressing toward goals    Frequency    Min 3X/week      PT Plan Current plan remains appropriate    Co-evaluation              AM-PAC PT "6 Clicks" Mobility   Outcome Measure  Help needed turning from your back to your side while in a flat bed without using bedrails?: A Little Help needed moving from lying on your back to sitting on the side of a flat bed without using bedrails?: A Little Help needed moving to and from a bed to a chair (including a wheelchair)?: A Little Help needed standing up from a chair using your arms (e.g., wheelchair or bedside chair)?: A Little Help needed to walk in hospital room?: A Little Help needed climbing 3-5 steps with a railing? : A Lot 6 Click Score: 17    End of Session Equipment Utilized During Treatment: Gait belt Activity Tolerance: Patient tolerated treatment well;Patient limited by fatigue Patient left: in chair;with call bell/phone within reach Nurse Communication: Mobility status PT Visit Diagnosis: Other abnormalities of gait and mobility (R26.89);Muscle weakness (generalized) (M62.81);Unsteadiness on feet (R26.81);Difficulty in walking, not elsewhere classified (R26.2);Pain     Time: 0820-0855 PT Time Calculation (min) (ACUTE ONLY): 35 min  Charges:  $Gait Training: 8-22 mins $Therapeutic Exercise: 8-22 mins                     Mabeline Caras, PT, DPT Acute Rehabilitation Services  Pager [phone] Office [place] 11/07/2021, 11:45 AM

## 2021-11-08 ENCOUNTER — Encounter: Payer: Self-pay | Admitting: Internal Medicine

## 2021-11-08 ENCOUNTER — Telehealth: Payer: Self-pay

## 2021-11-08 DIAGNOSIS — I5043 Acute on chronic combined systolic (congestive) and diastolic (congestive) heart failure: Secondary | ICD-10-CM | POA: Diagnosis not present

## 2021-11-08 LAB — GLUCOSE, CAPILLARY
Glucose-Capillary: 110 mg/dL — ABNORMAL HIGH (ref 70–99)
Glucose-Capillary: 167 mg/dL — ABNORMAL HIGH (ref 70–99)
Glucose-Capillary: 129 mg/dL — ABNORMAL HIGH (ref 70–99)
Glucose-Capillary: 116 mg/dL — ABNORMAL HIGH (ref 70–99)

## 2021-11-08 LAB — BASIC METABOLIC PANEL
Anion gap: 7 (ref 5–15)
BUN: 43 mg/dL — ABNORMAL HIGH (ref 8–23)
CO2: 29 mmol/L (ref 22–32)
Calcium: 7.6 mg/dL — ABNORMAL LOW (ref 8.9–10.3)
Chloride: 95 mmol/L — ABNORMAL LOW (ref 98–111)
Creatinine, Ser: 1.09 mg/dL (ref 0.61–1.24)
GFR, Estimated: >60 mL/min (ref >60)
Glucose, Bld: 100 mg/dL — ABNORMAL HIGH (ref 70–99)
Potassium: 3.5 mmol/L (ref 3.5–5.1)
Sodium: 131 mmol/L — ABNORMAL LOW (ref 135–145)

## 2021-11-08 LAB — PROTIME-INR
INR: 1.9 — ABNORMAL HIGH (ref 0.8–1.2)
Prothrombin Time: 21.5 seconds — ABNORMAL HIGH (ref 11.4–15.2)

## 2021-11-08 LAB — COOXEMETRY PANEL
Carboxyhemoglobin: 1.8 % — ABNORMAL HIGH (ref 0.5–1.5)
Methemoglobin: <0.7 % (ref 0.0–1.5)
O2 Saturation: 63.9 %
Total hemoglobin: 8.4 g/dL — ABNORMAL LOW (ref 12.0–16.0)

## 2021-11-08 MED ORDER — ORAL CARE MOUTH RINSE
15.0000 mL | Freq: Two times a day (BID) | OROMUCOSAL | Status: DC
  Administered 2021-11-08 – 2021-11-18 (×16): 15 mL via OROMUCOSAL

## 2021-11-08 MED ORDER — POTASSIUM CHLORIDE 20 MEQ PO PACK
20.0000 meq | PACK | ORAL | Status: DC
  Administered 2021-11-08: 20 meq
  Filled 2021-11-08: qty 1

## 2021-11-08 MED ORDER — SENNA 8.6 MG PO TABS
2.0000 | ORAL_TABLET | Freq: Every day | ORAL | Status: DC
  Administered 2021-11-09 – 2021-11-15 (×4): 17.2 mg via ORAL
  Filled 2021-11-08 (×8): qty 2

## 2021-11-08 MED ORDER — TORSEMIDE 20 MG PO TABS: 40.0000 mg | ORAL_TABLET | Freq: Every day | ORAL | Status: DC

## 2021-11-08 MED ORDER — POTASSIUM CHLORIDE 20 MEQ PO PACK
60.0000 meq | PACK | Freq: Once | ORAL | Status: AC
  Administered 2021-11-08: 60 meq via ORAL
  Filled 2021-11-08: qty 3

## 2021-11-08 MED ORDER — MIDODRINE HCL 5 MG PO TABS
2.5000 mg | ORAL_TABLET | Freq: Three times a day (TID) | ORAL | Status: DC
  Administered 2021-11-08 – 2021-11-09 (×4): 2.5 mg via ORAL
  Filled 2021-11-08 (×4): qty 1

## 2021-11-08 MED ORDER — FUROSEMIDE 10 MG/ML IJ SOLN
80.0000 mg | Freq: Once | INTRAMUSCULAR | Status: AC
  Administered 2021-11-08: 80 mg via INTRAVENOUS
  Filled 2021-11-08: qty 8

## 2021-11-08 MED FILL — Sodium Chloride IV Soln 0.9%: INTRAVENOUS | Qty: 3000 | Status: AC

## 2021-11-08 MED FILL — Electrolyte-R (PH 7.4) Solution: INTRAVENOUS | Qty: 4000 | Status: AC

## 2021-11-08 MED FILL — Heparin Sodium (Porcine) Inj 1000 Unit/ML: INTRAMUSCULAR | Qty: 20 | Status: AC

## 2021-11-08 MED FILL — Calcium Chloride Inj 10%: INTRAVENOUS | Qty: 10 | Status: AC

## 2021-11-08 MED FILL — Mannitol IV Soln 20%: INTRAVENOUS | Qty: 500 | Status: AC

## 2021-11-08 MED FILL — Heparin Sodium (Porcine) Inj 1000 Unit/ML: INTRAMUSCULAR | Qty: 60 | Status: AC

## 2021-11-08 MED FILL — Sodium Bicarbonate IV Soln 8.4%: INTRAVENOUS | Qty: 50 | Status: AC

## 2021-11-08 NOTE — Progress Notes (Signed)
Patient ID: Keith Jenkins, male   DOB: December 18, 1945, 76 y.o.   MRN: [id number]   Brief GI Note  Patient has been scheduled for outpatient follow-up with Dr. Rhea Belton in the office on 02/04/2022 at 9:30 AM- [address], third floor  Please include this with his discharge instructions- thank you

## 2021-11-08 NOTE — Telephone Encounter (Signed)
-----   Message from Alfredia Ferguson, PA-C sent at 11/07/2021  5:03 PM EDT ----- Regarding: office appt Beth, this patient was seen in consult today at [facility], has an esophageal stricture but has just had CABG and mitral valve replacement Please see if we can get him an appointment to see Dr. Hilarie Fredrickson in the office if August schedule is out put him in towards the end of August.   Can secure chat me in the morning and I will put the appointment in his chart thank you

## 2021-11-08 NOTE — Telephone Encounter (Signed)
Secure chat-appointment Tuesday 02/04/22 at 9:30 am. New patient information will be mailed to the patient.

## 2021-11-08 NOTE — Progress Notes (Signed)
Occupational Therapy Treatment Patient Details Name: Keith Jenkins MRN: [id number] DOB: Feb 18, 1946 Today's Date: 11/08/2021   History of present illness Pt is a 76 y.o. male admitted 10/24/21 witih SOB and chest pain after recent STEMI. Workup for CHF, aflutter. S/p CABG, MVR 5/26. PMH includes pre-DM, HLD, arthritis, glaucoma.   OT comments  Patient continues to porgress with mobility and activity tolerance, hopefully OT can begin more ADL completion for eventual discharge home with assist from family.  OT will continue efforts in the acute setting.     Recommendations for follow up therapy are one component of a multi-disciplinary discharge planning process, led by the attending physician.  Recommendations may be updated based on patient status, additional functional criteria and insurance authorization.    Follow Up Recommendations  No OT follow up    Assistance Recommended at Discharge Frequent or constant Supervision/Assistance  Patient can return home with the following  A little help with walking and/or transfers;A little help with bathing/dressing/bathroom;Assistance with cooking/housework;Assist for transportation   Equipment Recommendations  None recommended by OT    Recommendations for Other Services      Precautions / Restrictions Precautions Precautions: Fall;Sternal Restrictions Weight Bearing Restrictions: Yes Other Position/Activity Restrictions: Sternal precautions       Mobility Bed Mobility Overal bed mobility: Needs Assistance Bed Mobility: Sit to Supine       Sit to supine: Min assist        Transfers Overall transfer level: Needs assistance Equipment used: Rollator (4 wheels) Transfers: Sit to/from Stand Sit to Stand: Min assist                 Balance Overall balance assessment: Needs assistance Sitting-balance support: No upper extremity supported, Feet supported Sitting balance-Leahy Scale: Good     Standing balance support: [place]  on assistive device for balance Standing balance-Leahy Scale: Fair                             ADL either performed or assessed with clinical judgement   ADL                                                                              Cognition Arousal/Alertness: Awake/alert Behavior During Therapy: WFL for tasks assessed/performed, Flat affect Overall Cognitive Status: Within Functional Limits for tasks assessed                                                       General Comments  O2 sats >92% on RA throughout    Pertinent Vitals/ Pain       Pain Assessment Pain Assessment: Faces Faces Pain Scale: Hurts a little bit Pain Location: sternal incision, generalized Pain Descriptors / Indicators: Tender Pain Intervention(s): Monitored during session  Frequency  Min 2X/week        Progress Toward Goals  OT Goals(current goals can now be found in the care plan section)  Progress towards OT goals: Progressing toward goals  Acute Rehab OT Goals OT Goal Formulation: With patient/family Time For Goal Achievement: 11/14/21 Potential to Achieve Goals: Good  Plan Discharge plan remains appropriate    Co-evaluation    PT/OT/SLP Co-Evaluation/Treatment: Yes            AM-PAC OT "6 Clicks" Daily Activity     Outcome Measure   Help from another person eating meals?: None Help from another person taking care of personal grooming?: None   Help from another person bathing (including washing, rinsing, drying)?: A Lot Help from another person to put on and taking off regular upper body clothing?: A Little Help from another person to put on and taking off regular lower body clothing?: A Lot 6 Click Score: 15    End of Session Equipment Utilized During Treatment: Rollator (4 wheels)  OT Visit Diagnosis: Unsteadiness on  feet (R26.81);Other abnormalities of gait and mobility (R26.89);Muscle weakness (generalized) (M62.81)   Activity Tolerance Patient limited by fatigue   Patient Left in bed;with call bell/phone within reach   Nurse Communication Mobility status        Time: 0820-0840 OT Time Calculation (min): 20 min  Charges: OT General Charges $OT Visit: 1 Visit OT Treatments $Self Care/Home Management : 8-22 mins  11/08/2021  RP, OTR/L  Acute Rehabilitation Services  Office:  [phone]   Suzanna Obey 11/08/2021, 8:45 AM

## 2021-11-08 NOTE — Progress Notes (Signed)
Nutrition Brief Note  RD to follow-up with calorie count. No calorie count information in room. GI team was able to advance diet with recommendations to stay away from tough meats, breads, and sticky/dry foods.   Family present in room, pt was sleeping and family asked not to wake.  Family asked questions about diet when going home. Family is concerned about pt being on a low sodium/heart healthy diet. RD expressed to family that adequate intake is more important to promote healing and prevent further weight loss over low sodium diet. Family expressed that pt is drinking Ensure and likes them, discussed ways to incorporate them in to foods. Informed family that RD will add nutrition handouts to discharge instructions. Reviewed handout with family while in room.  This RD hung up calorie count envelope in pt room. RD to follow-up on calorie count on Monday. Discussed with family that if pt PO intake is not adequate over the weekend, may need to discuss alternative means of nutrition.    Kirby Crigler RD, LDN Clinical Dietitian See Loretha Stapler for contact information.

## 2021-11-08 NOTE — Progress Notes (Signed)
Advanced Heart Failure Rounding Note  PCP-Cardiologist: None   Subjective:    05/19: Admit with a/c CHF. CO-OX okay but appeared low-output >>started milrinone 0.25 05/22: Developed A flutter. Started on amio drip.  05/26: S/p CABG x2 (LIMA-LAD, SVG-RAMUS INT) +MVR, MAZE, LAA Clip  6/1: Barium swallow with distal esophageal stricture. GI consult -> ok to continue po (outpatient f/u for EGD/dilation)  Remains on NE 6 and milrinone 0.125. SBP 90-100. Co-ox 64%  Has been diuresing and is net negative but weight and CVP up. CVP 12  Good BM. Denies CP or SOB.     Objective:   Weight Range: 99 kg Body mass index is 28.8 kg/m.   Vital Signs:   Temp:  [97.6 F (36.4 C)-98.4 F (36.9 C)] 97.7 F (36.5 C) (06/02 0645) Pulse Rate:  [66-88] 80 (06/02 0800) Resp:  [12-30] 20 (06/02 0800) BP: (81-142)/(50-98) 91/58 (06/02 0800) SpO2:  [89 %-99 %] 99 % (06/02 0800) Weight:  [99 kg] 99 kg (06/02 0500) Last BM Date : 11/07/21  Weight change: Filed Weights   11/06/21 0500 11/07/21 0500 11/08/21 0500  Weight: 98.4 kg 98.4 kg 99 kg    Intake/Output:   Intake/Output Summary (Last 24 hours) at 11/08/2021 0814 Last data filed at 11/08/2021 0800 Gross per 24 hour  Intake 1202.14 ml  Output 2250 ml  Net -1047.86 ml       Physical Exam   General:  Sitting up in bed. No resp difficulty HEENT: normal Neck: supple. no JVD. Carotids 2+ bilat; no bruits. No lymphadenopathy or thryomegaly appreciated. Cor: PMI nondisplaced. Regular rate & rhythm. No rubs, gallops or murmurs. Lungs: clear Abdomen: soft, nontender, nondistended. No hepatosplenomegaly. No bruits or masses. Good bowel sounds. Extremities: no cyanosis, clubbing, rash, tr edema + TED Neuro: alert & orientedx3, cranial nerves grossly intact. moves all 4 extremities w/o difficulty. Affect pleasant   Telemetry   SR 70s Personally reviewed   Labs    CBC Recent Labs    11/06/21 0420 11/07/21 0920  WBC 11.8* 10.2   HGB 7.9* 8.3*  HCT 23.0* 25.2*  MCV 85.8 86.9  PLT 135* 0000000    Basic Metabolic Panel Recent Labs    11/06/21 0420 11/07/21 0500 11/08/21 0351  NA 131* 128* 131*  K 3.8 4.1 3.5  CL 97* 96* 95*  CO2 27 27 29   GLUCOSE 185* 210* 100*  BUN 44* 41* 43*  CREATININE 1.00 1.04 1.09  CALCIUM 7.4* 7.5* 7.6*  MG 2.4  --   --     Liver Function Tests No results for input(s): AST, ALT, ALKPHOS, BILITOT, PROT, ALBUMIN in the last 72 hours.  No results for input(s): LIPASE, AMYLASE in the last 72 hours. Cardiac Enzymes No results for input(s): CKTOTAL, CKMB, CKMBINDEX, TROPONINI in the last 72 hours.  BNP: BNP (last 3 results) Recent Labs    10/10/21 1606 10/22/21 1623 10/24/21 2150  BNP 1,564.6* 1,226.7* 1,306.1*     ProBNP (last 3 results) No results for input(s): PROBNP in the last 8760 hours.   D-Dimer No results for input(s): DDIMER in the last 72 hours. Hemoglobin A1C No results for input(s): HGBA1C in the last 72 hours.  Fasting Lipid Panel No results for input(s): CHOL, HDL, LDLCALC, TRIG, CHOLHDL, LDLDIRECT in the last 72 hours. Thyroid Function Tests No results for input(s): TSH, T4TOTAL, T3FREE, THYROIDAB in the last 72 hours.  Invalid input(s): FREET3  Other results:   Imaging    DG ESOPHAGUS W  SINGLE CM (SOL OR THIN BA)  Result Date: 11/07/2021 CLINICAL DATA:  Dysphagia, primarily to solid foods. Prior history esophageal stricture. Noted recent difficulty with passage of transesophageal echocardiogram probe. EXAM: ESOPHAGUS/BARIUM SWALLOW/TABLET STUDY TECHNIQUE: Single contrast examination was performed using thin liquid barium. Exam limited to supine LPO position due to limited mobility and recent open heart surgery. This exam was performed by Ascencion Dike PA-C, and was supervised and interpreted by Dr. Sherryl Barters. FLUOROSCOPY: Radiation Exposure Index (as provided by the fluoroscopic device): 37.80 mGy Kerma COMPARISON:  None Available. FINDINGS:  Swallowing: Appears normal. No vestibular penetration or aspiration seen. Pharynx: Unremarkable. Esophagus: Significant stricture of the distal esophagus with minimal associated irregularity is shown on image 43 series 8. There is free flow of thin barium passed this region into the stomach, however there is also resultant stasis of contrast in the mid to distal esophagus. The stricture is approximately 0.6 cm in diameter and extends along a 2.2 cm length of the distal esophagus. There is also a small outpouching of contrast along the gastroesophageal junction in the vicinity of the hiatal hernia for example on image 30 of series 8, a small focal ulcer is not completely excluded. Esophageal motility: Not fully evaluated but like significant dysmotility. Hiatal Hernia: Small hiatal hernia Gastroesophageal reflux: None visualized. Ingested 13 mm barium tablet: Became stuck at the area of the above-mentioned stricture. Would not pass despite additional thin barium and waiting several minutes. IMPRESSION: 1. Substantial stricture in the distal 2.2 cm of the esophagus, narrowing the diameter down to 0.6 cm. This is mostly smooth but merits endoscopic assessment. 2. Small outpouching of contrast along the gastroesophageal junction, cannot exclude a small ulcer in this vicinity. 3. Small type 1 hiatal hernia. Read by: Ascencion Dike PA-C Electronically Signed   By: Van Clines M.D.   On: 11/07/2021 10:44     Medications:     Scheduled Medications:  amiodarone  200 mg Oral BID   aspirin  81 mg Oral Daily   atorvastatin  80 mg Oral Daily   bisacodyl  10 mg Oral Daily   Or   bisacodyl  10 mg Rectal Daily   Chlorhexidine Gluconate Cloth  6 each Topical Q0600   digoxin  0.125 mg Oral Daily   docusate  200 mg Oral BID   escitalopram  10 mg Oral Daily   feeding supplement  237 mL Oral TID WC & HS   furosemide  80 mg Intravenous Once   insulin aspart  0-24 Units Subcutaneous TID WC   insulin detemir  25  Units Subcutaneous Daily   latanoprost  1 drop Both Eyes QHS   lidocaine  2 patch Transdermal Q24H   midodrine  2.5 mg Oral TID WC   multivitamin  15 mL Oral Daily   pantoprazole (PROTONIX) IV  40 mg Intravenous Q12H   potassium chloride  60 mEq Oral Once   sodium chloride flush  10-40 mL Intracatheter Q12H   sodium chloride flush  3 mL Intravenous Q12H   warfarin  2.5 mg Oral q1600   Warfarin - Physician Dosing Inpatient   Does not apply q1600    Infusions:  sodium chloride     lactated ringers     milrinone 0.125 mcg/kg/min (11/08/21 0800)   norepinephrine (LEVOPHED) Adult infusion 5 mcg/min (11/08/21 0800)    PRN Medications: morphine injection, ondansetron (ZOFRAN) IV, oxyCODONE, sodium chloride flush, sodium chloride flush, traMADol    Patient Profile   76 y.o. male with  hx COPD, prediabetes, HLD, GERD. Recent admit earlier this month with later presentation inferior STEMI and new systolic CHF w/ moderate to severe ischemic MR.   Readmitted with a/c CHF. Optimized from HF standpoint. Underwent CABG w MVR, MAZE and LAA clip 5/26.   Assessment/Plan   1. CAD/recent late presentation inferior STEMI - LHC 05/04: 100% m RCA and 90% m LAD.  - RCA managed medically d/t completed infarct.  - Echo (5/23) EF 25%, RV moderately reduced, moderate to severe MR - 5/26: S/p CABG x 2 (LIMA-LAD, SVG-Ramus INT)   - ASA + high intensity statin  - No s/s angina   2. Acute on chronic systolic CHF with low-output/ICM - s/p late presentation inferior STEMI 05/23 - Echo 05/23 EF 25-30%, RV moderately reduced, moderate to severe MR. - Limited echo on admit EF 30-35%, RV moderately reduced, MR not well evaluated, moderate pericardial effusion - S/p CABG + MVR 5/26 - on Milrinone 0.125, NE 6 Epi off. CO-OX stable at 64% - BP soft, difficulty weaning off NE. Add midodrine 2.5 TID to facilitate NE wean.  - CVP 12. Give IV lasix 80 today. Switch to po torsemide tomorrow  - Supp K - Continue  digoxin 0.125 - Eventually add back GDMT as blood pressure allows - Given age, fraility and reduced LV function suspect may be slow inotrope wean.   3. Mitral regurgitation - Moderate to severe ischemic MR on TEE.  - S/p tissue MVR 11/02/21 - On low-dose warfarin per TCTS. No INR today. Wil check. D/w PharmD   4. Aortic valve insufficiency - Mild to moderate AS/AI - stable.    5. Pericardial effusion/Acute Pericarditis  - Moderate in size on echo, no respiratory variation or RV collapse to suggest tamponade - acute pericarditis/ adhensions noted at time of CABG - CTs out   6. H/o AKI - Scr up slightly 1.10 > 1.4, now back to baseline at 1.1 - Watch renal function closely    7. Prediabetes - A1c 5.8 1/23 - holding Farxiga for now   8. PAF - s/p MAZE w/ CABG  - maintaining SR. continue amio gtt while on inotrope support - warfarin started, now held d/t supratherapeutic INR. Down to 2.1 today.  9. Hyponatremia  - Na 133 > 131 > 130 > 128 > 131 > 128 today - Fluid restrict.  10. Post-op ileus/esophageal stricture - also has h/o esophageal stricture (was unable to pass TEE probe transgastric prior to CABG) - Barium swallow with distal stricture. GI has seen. Appreciate recs. Outpatient EGD with dilation - Can stop reglan. Optimize bowel regimen.   11. Post-op anemia - Hgb 10.4>9.2>7.9>8.2>7.9, CBC today - Follow  Continue to mobilize. Aggressive PT/OT. Cardiac rehab.  CRITICAL CARE Performed by: Arvilla Meres  Total critical care time: 35 minutes  Critical care time was exclusive of separately billable procedures and treating other patients.  Critical care was necessary to treat or prevent imminent or life-threatening deterioration.  Critical care was time spent personally by me (independent of midlevel providers or residents) on the following activities: development of treatment plan with patient and/or surrogate as well as nursing, discussions with consultants,  evaluation of patient's response to treatment, examination of patient, obtaining history from patient or surrogate, ordering and performing treatments and interventions, ordering and review of laboratory studies, ordering and review of radiographic studies, pulse oximetry and re-evaluation of patient's condition.    Length of Stay: 15  Arvilla Meres, MD  11/08/2021, 8:14 AM  Advanced Heart  Failure Team Pager [phone] (M-F; 7a - 5p)  Please contact [facility] for night-coverage after hours (5p -7a ) and weekends on [email or web address]

## 2021-11-08 NOTE — Progress Notes (Signed)
7 Days Post-Op Procedure(s) (LRB): MITRAL VALVE (MV) REPLACEMENT USING MITRIS VALVE. (N/A) CORONARY ARTERY BYPASS GRAFTING (CABG) X TWO USING OPEN LEFT INTERNAL MAMMARY ARTERY AND ENDOSOPIC RIGHT GREATER SAPHENOUS VEIN HARVEST. (N/A) MAZE (N/A) TRANSESOPHAGEAL ECHOCARDIOGRAM (TEE) (N/A) CLIPPING OF ATRIAL APPENDAGE USING AN ATRICLIP PRO2 (N/A) Subjective:  Feels better today. Ambulating well. Eating better. + BM.  Objective: Vital signs in last 24 hours: Temp:  [97.6 F (36.4 C)-97.9 F (36.6 C)] 97.9 F (36.6 C) (06/02 1626) Pulse Rate:  [71-88] 84 (06/02 1300) Cardiac Rhythm: Normal sinus rhythm (06/02 1200) Resp:  [12-30] 16 (06/02 1300) BP: (81-142)/(50-98) 108/55 (06/02 1300) SpO2:  [89 %-99 %] 96 % (06/02 1300) Weight:  [99 kg] 99 kg (06/02 0500)  Hemodynamic parameters for last 24 hours: CVP:  [0 mmHg-15 mmHg] 0 mmHg  Intake/Output from previous day: 06/01 0701 - 06/02 0700 In: 1185.6 [P.O.:834; I.V.:351.6] Out: 2650 [Urine:2650] Intake/Output this shift: Total I/O In: 2456 [P.O.:1034; I.V.:92; Other:1330] Out: 750 [Urine:750]  General appearance: alert and cooperative Neurologic: intact Heart: regular rate and rhythm, S1, S2 normal, no murmur Lungs: diminished breath sounds bibasilar Extremities: edema mild Wound: incision healing well.  Lab Results: Recent Labs    11/06/21 0420 11/07/21 0920  WBC 11.8* 10.2  HGB 7.9* 8.3*  HCT 23.0* 25.2*  PLT 135* 176   BMET:  Recent Labs    11/07/21 0500 11/08/21 0351  NA 128* 131*  K 4.1 3.5  CL 96* 95*  CO2 27 29  GLUCOSE 210* 100*  BUN 41* 43*  CREATININE 1.04 1.09  CALCIUM 7.5* 7.6*    PT/INR:  Recent Labs    11/08/21 1014  LABPROT 21.5*  INR 1.9*   ABG    Component Value Date/Time   PHART 7.300 (L) 11/02/2021 0151   HCO3 23.7 11/02/2021 0151   TCO2 25 11/02/2021 0151   ACIDBASEDEF 3.0 (H) 11/02/2021 0151   O2SAT 63.9 11/08/2021 0353   CBG (last 3)  Recent Labs     11/08/21 0644 11/08/21 1129 11/08/21 1621  GLUCAP 110* 167* 129*    Assessment/Plan: S/P Procedure(s) (LRB): MITRAL VALVE (MV) REPLACEMENT USING MITRIS VALVE. (N/A) CORONARY ARTERY BYPASS GRAFTING (CABG) X TWO USING OPEN LEFT INTERNAL MAMMARY ARTERY AND ENDOSOPIC RIGHT GREATER SAPHENOUS VEIN HARVEST. (N/A) MAZE (N/A) TRANSESOPHAGEAL ECHOCARDIOGRAM (TEE) (N/A) CLIPPING OF ATRIAL APPENDAGE USING AN ATRICLIP PRO2 (N/A)  POD 7   BP is borderline with MAP 70's  on milrinone 0.125 and NE 4. Co-ox 63.9. CVP 10.   I/O -1500 yesterday but wt increasing.    DM: glucose under adequate control.   Stricture on esophagram. Soft diet.    INR 1.9. Continue Coumadin 2.5 daily for now.   Continue IS, ambulation, PT/OT   LOS: 15 days    [name] 11/08/2021

## 2021-11-09 DIAGNOSIS — I5043 Acute on chronic combined systolic (congestive) and diastolic (congestive) heart failure: Secondary | ICD-10-CM | POA: Diagnosis not present

## 2021-11-09 LAB — COOXEMETRY PANEL
Carboxyhemoglobin: 1.7 % — ABNORMAL HIGH (ref 0.5–1.5)
Methemoglobin: 0.7 % (ref 0.0–1.5)
O2 Saturation: 60.6 %
Total hemoglobin: 8.2 g/dL — ABNORMAL LOW (ref 12.0–16.0)

## 2021-11-09 LAB — GLUCOSE, CAPILLARY
Glucose-Capillary: 141 mg/dL — ABNORMAL HIGH (ref 70–99)
Glucose-Capillary: 106 mg/dL — ABNORMAL HIGH (ref 70–99)
Glucose-Capillary: 116 mg/dL — ABNORMAL HIGH (ref 70–99)

## 2021-11-09 LAB — BASIC METABOLIC PANEL
Anion gap: 6 (ref 5–15)
BUN: 39 mg/dL — ABNORMAL HIGH (ref 8–23)
CO2: 29 mmol/L (ref 22–32)
Calcium: 7.5 mg/dL — ABNORMAL LOW (ref 8.9–10.3)
Chloride: 93 mmol/L — ABNORMAL LOW (ref 98–111)
Creatinine, Ser: 1.05 mg/dL (ref 0.61–1.24)
GFR, Estimated: >60 mL/min (ref >60)
Glucose, Bld: 105 mg/dL — ABNORMAL HIGH (ref 70–99)
Potassium: 3.8 mmol/L (ref 3.5–5.1)
Sodium: 128 mmol/L — ABNORMAL LOW (ref 135–145)

## 2021-11-09 LAB — PROTIME-INR
INR: 2.1 — ABNORMAL HIGH (ref 0.8–1.2)
Prothrombin Time: 23.3 seconds — ABNORMAL HIGH (ref 11.4–15.2)

## 2021-11-09 MED ORDER — FUROSEMIDE 10 MG/ML IJ SOLN
80.0000 mg | Freq: Two times a day (BID) | INTRAMUSCULAR | Status: AC
  Administered 2021-11-09 (×2): 80 mg via INTRAVENOUS
  Filled 2021-11-09 (×2): qty 8

## 2021-11-09 MED ORDER — POTASSIUM CHLORIDE CRYS ER 20 MEQ PO TBCR
40.0000 meq | EXTENDED_RELEASE_TABLET | Freq: Once | ORAL | Status: AC
Start: 2021-11-09 — End: 2021-11-09
  Administered 2021-11-09: 40 meq via ORAL
  Filled 2021-11-09: qty 2

## 2021-11-09 MED ORDER — MIDODRINE HCL 5 MG PO TABS
5.0000 mg | ORAL_TABLET | Freq: Three times a day (TID) | ORAL | Status: DC
  Administered 2021-11-09 (×3): 5 mg via ORAL
  Filled 2021-11-09 (×3): qty 1

## 2021-11-09 MED ORDER — MIDODRINE HCL 5 MG PO TABS
5.0000 mg | ORAL_TABLET | Freq: Three times a day (TID) | ORAL | Status: DC
Start: 2021-11-09 — End: 2021-11-09

## 2021-11-09 NOTE — Progress Notes (Signed)
Patient ID: Keith Jenkins, male   DOB: Oct 19, 1945, 76 y.o.   MRN: [id number] TCTS Evening Rounds:  Hemodynamics stable on milrinone 0.125 and NE 1 with SBP 90. On midodrine.  Excellent diuresis today.   Ambulated well today.

## 2021-11-09 NOTE — Progress Notes (Signed)
Patient ID: Keith Jenkins, male   DOB: 12-24-1945, 76 y.o.   MRN: [id number]     Advanced Heart Failure Rounding Note  PCP-Cardiologist: None   Subjective:    05/19: Admit with a/c CHF. CO-OX okay but appeared low-output >>started milrinone 0.25 05/22: Developed A flutter. Started on amio drip.  05/26: S/p CABG x2 (LIMA-LAD, SVG-RAMUS INT) +MVR, MAZE, LAA Clip  6/1: Barium swallow with distal esophageal stricture. GI consult -> ok to continue po (outpatient f/u for EGD/dilation)  Remains on NE 2 and milrinone 0.125. SBP 90s-100s. Co-ox 61%  I/Os even yesterday, weight up 2 lbs.  He had IV Lasix 1 time yesterday with good response but had a lot in.  CVP 12 today.   No chest pain or dyspnea. Walked in hall today.    Objective:   Weight Range: 100.2 kg Body mass index is 29.14 kg/m.   Vital Signs:   Temp:  [97.7 F (36.5 C)-97.9 F (36.6 C)] 97.9 F (36.6 C) (06/02 1626) Pulse Rate:  [73-85] 80 (06/03 0730) Resp:  [12-24] 19 (06/03 0730) BP: (87-126)/(55-82) 98/57 (06/03 0730) SpO2:  [93 %-98 %] 96 % (06/03 0730) Weight:  [100.2 kg] 100.2 kg (06/03 0500) Last BM Date : 11/07/21  Weight change: Filed Weights   11/07/21 0500 11/08/21 0500 11/09/21 0500  Weight: 98.4 kg 99 kg 100.2 kg    Intake/Output:   Intake/Output Summary (Last 24 hours) at 11/09/2021 0831 Last data filed at 11/09/2021 0600 Gross per 24 hour  Intake 2221.77 ml  Output 3550 ml  Net -1328.23 ml      Physical Exam   General: NAD Neck: JVP 12 cm, no thyromegaly or thyroid nodule.  Lungs: Decreased at bases.  CV: Nondisplaced PMI.  Heart regular S1/S2, no S3/S4, no murmur.  1+ edema to knees.  Abdomen: Soft, nontender, no hepatosplenomegaly, no distention.  Skin: Intact without lesions or rashes.  Neurologic: Alert and oriented x 3.  Psych: Normal affect. Extremities: No clubbing or cyanosis.  HEENT: Normal.   Telemetry   SR 70s Personally reviewed   Labs    CBC Recent Labs     11/07/21 0920  WBC 10.2  HGB 8.3*  HCT 25.2*  MCV 86.9  PLT 0000000   Basic Metabolic Panel Recent Labs    11/08/21 0351 11/09/21 0508  NA 131* 128*  K 3.5 3.8  CL 95* 93*  CO2 29 29  GLUCOSE 100* 105*  BUN 43* 39*  CREATININE 1.09 1.05  CALCIUM 7.6* 7.5*   Liver Function Tests No results for input(s): AST, ALT, ALKPHOS, BILITOT, PROT, ALBUMIN in the last 72 hours.  No results for input(s): LIPASE, AMYLASE in the last 72 hours. Cardiac Enzymes No results for input(s): CKTOTAL, CKMB, CKMBINDEX, TROPONINI in the last 72 hours.  BNP: BNP (last 3 results) Recent Labs    10/10/21 1606 10/22/21 1623 10/24/21 2150  BNP 1,564.6* 1,226.7* 1,306.1*    ProBNP (last 3 results) No results for input(s): PROBNP in the last 8760 hours.   D-Dimer No results for input(s): DDIMER in the last 72 hours. Hemoglobin A1C No results for input(s): HGBA1C in the last 72 hours.  Fasting Lipid Panel No results for input(s): CHOL, HDL, LDLCALC, TRIG, CHOLHDL, LDLDIRECT in the last 72 hours. Thyroid Function Tests No results for input(s): TSH, T4TOTAL, T3FREE, THYROIDAB in the last 72 hours.  Invalid input(s): FREET3  Other results:   Imaging    No results found.   Medications:  Scheduled Medications:  amiodarone  200 mg Oral BID   aspirin  81 mg Oral Daily   atorvastatin  80 mg Oral Daily   bisacodyl  10 mg Oral Daily   Or   bisacodyl  10 mg Rectal Daily   Chlorhexidine Gluconate Cloth  6 each Topical Q0600   digoxin  0.125 mg Oral Daily   docusate  200 mg Oral BID   escitalopram  10 mg Oral Daily   feeding supplement  237 mL Oral TID WC & HS   furosemide  80 mg Intravenous BID   insulin aspart  0-24 Units Subcutaneous TID WC   insulin detemir  25 Units Subcutaneous Daily   latanoprost  1 drop Both Eyes QHS   lidocaine  2 patch Transdermal Q24H   mouth rinse  15 mL Mouth Rinse BID   midodrine  5 mg Oral TID WC   multivitamin  15 mL Oral Daily   pantoprazole  (PROTONIX) IV  40 mg Intravenous Q12H   potassium chloride  40 mEq Oral Once   senna  2 tablet Oral QHS   sodium chloride flush  10-40 mL Intracatheter Q12H   sodium chloride flush  3 mL Intravenous Q12H   warfarin  2.5 mg Oral q1600   Warfarin - Physician Dosing Inpatient   Does not apply q1600    Infusions:  sodium chloride     lactated ringers     milrinone 0.125 mcg/kg/min (11/09/21 0600)   norepinephrine (LEVOPHED) Adult infusion 2 mcg/min (11/09/21 0600)    PRN Medications: morphine injection, ondansetron (ZOFRAN) IV, oxyCODONE, sodium chloride flush, sodium chloride flush, traMADol    Patient Profile   76 y.o. male with hx COPD, prediabetes, HLD, GERD. Recent admit earlier this month with later presentation inferior STEMI and new systolic CHF w/ moderate to severe ischemic MR.   Readmitted with a/c CHF. Optimized from HF standpoint. Underwent CABG w MVR, MAZE and LAA clip 5/26.   Assessment/Plan   1. CAD/recent late presentation inferior STEMI - LHC 05/04: 100% m RCA and 90% m LAD.  - RCA managed medically d/t completed infarct.  - Echo (5/23) EF 25%, RV moderately reduced, moderate to severe MR - 5/26: S/p CABG x 2 (LIMA-LAD, SVG-Ramus INT)   - ASA + high intensity statin  - No chest pain.    2. Acute on chronic systolic CHF with low-output/ICM - s/p late presentation inferior STEMI 05/23 - Echo 05/23 EF 25-30%, RV moderately reduced, moderate to severe MR. - Limited echo on admit EF 30-35%, RV moderately reduced, MR not well evaluated, moderate pericardial effusion - S/p CABG + bioprosthetic MVR 5/26 - on Milrinone 0.125, NE 2. CO-OX stable at 61% - BP soft, difficulty weaning off NE. Increase midodrine to 5 mg TID to facilitate NE wean.  - CVP 12, weight up.  Lasix 80 mg IV bid today and replace K.  - Continue digoxin 0.125 - Eventually add back GDMT as blood pressure allows - Given age, fraility and reduced LV function suspect may be slow inotrope wean.    3. Mitral regurgitation - Moderate to severe ischemic MR on TEE.  - S/p bioprosthetic MVR 11/02/21 - On low-dose warfarin per TCTS.    4. Aortic valve insufficiency - Mild to moderate AS/AI - stable.    5. Pericardial effusion/Acute Pericarditis  - Moderate in size on echo, no respiratory variation or RV collapse to suggest tamponade - acute pericarditis/ adhensions noted at time of CABG - CTs out  6. H/o AKI - Creatinine stable today at 1.05, BUN lower.  - Watch renal function closely    7. Prediabetes - A1c 5.8 1/23 - holding [place] for now   8. PAF - s/p MAZE w/ CABG  - maintaining SR. continue amiodarone while on inotrope support - warfarin started, INR 2.1 today.  9. Hyponatremia  - Na 133 > 131 > 130 > 128 > 131 > 128 > 128 today - Fluid restrict.  10. Post-op ileus/esophageal stricture - also has h/o esophageal stricture (was unable to pass TEE probe transgastric prior to CABG) - Barium swallow with distal stricture. GI has seen. Appreciate recs. Outpatient EGD with dilation  11. Post-op anemia - Needs CBC today.   Continue to mobilize. Aggressive PT/OT. Cardiac rehab.  CRITICAL CARE Performed by: Loralie Champagne  Total critical care time: 35 minutes  Critical care time was exclusive of separately billable procedures and treating other patients.  Critical care was necessary to treat or prevent imminent or life-threatening deterioration.  Critical care was time spent personally by me (independent of midlevel providers or residents) on the following activities: development of treatment plan with patient and/or surrogate as well as nursing, discussions with consultants, evaluation of patient's response to treatment, examination of patient, obtaining history from patient or surrogate, ordering and performing treatments and interventions, ordering and review of laboratory studies, ordering and review of radiographic studies, pulse oximetry and re-evaluation of  patient's condition.    Length of Stay: [place], MD  11/09/2021, 8:31 AM  Advanced Heart Failure Team Pager [phone] (M-F; 7a - 5p)  Please contact [facility] for night-coverage after hours (5p -7a ) and weekends on [email or web address]

## 2021-11-09 NOTE — Progress Notes (Signed)
8 Days Post-Op Procedure(s) (LRB): MITRAL VALVE (MV) REPLACEMENT USING MITRIS VALVE. (N/A) CORONARY ARTERY BYPASS GRAFTING (CABG) X TWO USING OPEN LEFT INTERNAL MAMMARY ARTERY AND ENDOSOPIC RIGHT GREATER SAPHENOUS VEIN HARVEST. (N/A) MAZE (N/A) TRANSESOPHAGEAL ECHOCARDIOGRAM (TEE) (N/A) CLIPPING OF ATRIAL APPENDAGE USING AN ATRICLIP PRO2 (N/A) Subjective: No complaints. Ate eggs and sausage for breakfast.  Objective: Vital signs in last 24 hours: Temp:  [97.7 F (36.5 C)-97.9 F (36.6 C)] 97.9 F (36.6 C) (06/02 1626) Pulse Rate:  [73-85] 80 (06/03 0730) Cardiac Rhythm: Normal sinus rhythm (06/03 0730) Resp:  [12-24] 19 (06/03 0730) BP: (87-126)/(55-79) 98/57 (06/03 0730) SpO2:  [93 %-98 %] 96 % (06/03 0730) Weight:  [100.2 kg] 100.2 kg (06/03 0500)  Hemodynamic parameters for last 24 hours: CVP:  [0 mmHg-21 mmHg] 16 mmHg  Intake/Output from previous day: 06/02 0701 - 06/03 0700 In: 3326.3 [P.O.:1804; I.V.:192.3] Out: 3550 [Urine:3550] Intake/Output this shift: No intake/output data recorded.  General appearance: alert and cooperative Neurologic: intact Heart: regular rate and rhythm, S1, S2 normal, no murmur, click, rub or gallop Lungs: clear to auscultation bilaterally Extremities: edema moderate in legs Wound: incision healing well.  Lab Results: Recent Labs    11/07/21 0920  WBC 10.2  HGB 8.3*  HCT 25.2*  PLT 176   BMET:  Recent Labs    11/08/21 0351 11/09/21 0508  NA 131* 128*  K 3.5 3.8  CL 95* 93*  CO2 29 29  GLUCOSE 100* 105*  BUN 43* 39*  CREATININE 1.09 1.05  CALCIUM 7.6* 7.5*    PT/INR:  Recent Labs    11/09/21 0508  LABPROT 23.3*  INR 2.1*   ABG    Component Value Date/Time   PHART 7.300 (L) 11/02/2021 0151   HCO3 23.7 11/02/2021 0151   TCO2 25 11/02/2021 0151   ACIDBASEDEF 3.0 (H) 11/02/2021 0151   O2SAT 60.6 11/09/2021 0511   CBG (last 3)  Recent Labs    11/08/21 1129 11/08/21 1621 11/08/21 2104  GLUCAP 167*  129* 116*    Assessment/Plan: S/P Procedure(s) (LRB): MITRAL VALVE (MV) REPLACEMENT USING MITRIS VALVE. (N/A) CORONARY ARTERY BYPASS GRAFTING (CABG) X TWO USING OPEN LEFT INTERNAL MAMMARY ARTERY AND ENDOSOPIC RIGHT GREATER SAPHENOUS VEIN HARVEST. (N/A) MAZE (N/A) TRANSESOPHAGEAL ECHOCARDIOGRAM (TEE) (N/A) CLIPPING OF ATRIAL APPENDAGE USING AN ATRICLIP PRO2 (N/A)  POD 8   BP is borderline with MAP 70's and systolic in the 90's on milrinone 0.125 and NE 2.  Co-ox 61. CVP 16. Midodrine increased.    I/O -223 yesterday but wt increasing daily. He is about 16 lbs over preop.   DM: glucose under adequate control.   Stricture on esophagram. Soft diet.    INR 2.1. Continue Coumadin 2.5 daily for now.   Continue IS, ambulation, PT/OT   LOS: 16 days    [name] 11/09/2021

## 2021-11-10 DIAGNOSIS — I5043 Acute on chronic combined systolic (congestive) and diastolic (congestive) heart failure: Secondary | ICD-10-CM | POA: Diagnosis not present

## 2021-11-10 LAB — BASIC METABOLIC PANEL
Anion gap: 8 (ref 5–15)
BUN: 37 mg/dL — ABNORMAL HIGH (ref 8–23)
CO2: 29 mmol/L (ref 22–32)
Calcium: 7.2 mg/dL — ABNORMAL LOW (ref 8.9–10.3)
Chloride: 93 mmol/L — ABNORMAL LOW (ref 98–111)
Creatinine, Ser: 1.06 mg/dL (ref 0.61–1.24)
GFR, Estimated: >60 mL/min (ref >60)
Glucose, Bld: 128 mg/dL — ABNORMAL HIGH (ref 70–99)
Potassium: 3.3 mmol/L — ABNORMAL LOW (ref 3.5–5.1)
Sodium: 130 mmol/L — ABNORMAL LOW (ref 135–145)

## 2021-11-10 LAB — COOXEMETRY PANEL
Carboxyhemoglobin: 2 % — ABNORMAL HIGH (ref 0.5–1.5)
Methemoglobin: 0.7 % (ref 0.0–1.5)
O2 Saturation: 64.9 %
Total hemoglobin: 8.1 g/dL — ABNORMAL LOW (ref 12.0–16.0)

## 2021-11-10 LAB — CBC
HCT: 24.8 % — ABNORMAL LOW (ref 39.0–52.0)
Hemoglobin: 8.4 g/dL — ABNORMAL LOW (ref 13.0–17.0)
MCH: 28.9 pg (ref 26.0–34.0)
MCHC: 33.9 g/dL (ref 30.0–36.0)
MCV: 85.2 fL (ref 80.0–100.0)
Platelets: 279 10*3/uL (ref 150–400)
RBC: 2.91 MIL/uL — ABNORMAL LOW (ref 4.22–5.81)
RDW: 14.6 % (ref 11.5–15.5)
WBC: 11.8 10*3/uL — ABNORMAL HIGH (ref 4.0–10.5)
nRBC: 0.3 % — ABNORMAL HIGH (ref 0.0–0.2)

## 2021-11-10 LAB — GLUCOSE, CAPILLARY
Glucose-Capillary: 108 mg/dL — ABNORMAL HIGH (ref 70–99)
Glucose-Capillary: 186 mg/dL — ABNORMAL HIGH (ref 70–99)
Glucose-Capillary: 143 mg/dL — ABNORMAL HIGH (ref 70–99)
Glucose-Capillary: 150 mg/dL — ABNORMAL HIGH (ref 70–99)

## 2021-11-10 LAB — PROTIME-INR
INR: 2.1 — ABNORMAL HIGH (ref 0.8–1.2)
Prothrombin Time: 23.4 seconds — ABNORMAL HIGH (ref 11.4–15.2)

## 2021-11-10 MED ORDER — MIDODRINE HCL 5 MG PO TABS
10.0000 mg | ORAL_TABLET | Freq: Three times a day (TID) | ORAL | Status: DC
  Administered 2021-11-10 – 2021-11-18 (×26): 10 mg via ORAL
  Filled 2021-11-10 (×26): qty 2

## 2021-11-10 MED ORDER — POTASSIUM CHLORIDE 20 MEQ PO PACK
20.0000 meq | PACK | ORAL | Status: AC
  Administered 2021-11-10 (×3): 20 meq
  Filled 2021-11-10 (×3): qty 1

## 2021-11-10 MED ORDER — POTASSIUM CHLORIDE 20 MEQ PO PACK
40.0000 meq | PACK | Freq: Once | ORAL | Status: AC
  Administered 2021-11-10: 40 meq via ORAL
  Filled 2021-11-10: qty 2

## 2021-11-10 MED ORDER — FUROSEMIDE 10 MG/ML IJ SOLN
80.0000 mg | Freq: Two times a day (BID) | INTRAMUSCULAR | Status: AC
  Administered 2021-11-10 (×2): 80 mg via INTRAVENOUS
  Filled 2021-11-10 (×2): qty 8

## 2021-11-10 MED ORDER — POTASSIUM CHLORIDE CRYS ER 20 MEQ PO TBCR
40.0000 meq | EXTENDED_RELEASE_TABLET | Freq: Once | ORAL | Status: DC
  Filled 2021-11-10: qty 2

## 2021-11-10 NOTE — Progress Notes (Signed)
9 Days Post-Op Procedure(s) (LRB): MITRAL VALVE (MV) REPLACEMENT USING MITRIS VALVE. (N/A) CORONARY ARTERY BYPASS GRAFTING (CABG) X TWO USING OPEN LEFT INTERNAL MAMMARY ARTERY AND ENDOSOPIC RIGHT GREATER SAPHENOUS VEIN HARVEST. (N/A) MAZE (N/A) TRANSESOPHAGEAL ECHOCARDIOGRAM (TEE) (N/A) CLIPPING OF ATRIAL APPENDAGE USING AN ATRICLIP PRO2 (N/A) Subjective:  Feels well. Ambulated this am. No SOB.   Co-ox 65% on milrinone 0.125 and NE 4. NE increased overnight despite adequate MAP. It was just weaned back off. On Midodrine for BP support.  -1300 cc yesterday but wt unchanged.  Objective: Vital signs in last 24 hours: Temp:  [96.8 F (36 C)-98.1 F (36.7 C)] 96.8 F (36 C) (06/04 0700) Pulse Rate:  [75-88] 81 (06/04 0600) Cardiac Rhythm: Normal sinus rhythm (06/04 0600) Resp:  [15-22] 20 (06/03 1500) BP: (86-126)/(54-76) 101/66 (06/04 0600) SpO2:  [93 %-100 %] 95 % (06/04 0600) Weight:  [100 kg] 100 kg (06/04 0500)  Hemodynamic parameters for last 24 hours: CVP:  [9 mmHg-13 mmHg] 13 mmHg  Intake/Output from previous day: 06/03 0701 - 06/04 0700 In: 608.9 [P.O.:470; I.V.:138.9] Out: 1900 [Urine:1900] Intake/Output this shift: Total I/O In: -  Out: 375 [Urine:375]  General appearance: alert and cooperative Neurologic: intact Heart: regular rate and rhythm, S1, S2 normal, no murmur Lungs: clear to auscultation bilaterally Extremities: edema moderate in legs Wound: incisions healing well.  Lab Results: No results for input(s): WBC, HGB, HCT, PLT in the last 72 hours. BMET:  Recent Labs    11/09/21 0508 11/10/21 0525  NA 128* 130*  K 3.8 3.3*  CL 93* 93*  CO2 29 29  GLUCOSE 105* 128*  BUN 39* 37*  CREATININE 1.05 1.06  CALCIUM 7.5* 7.2*    PT/INR:  Recent Labs    11/10/21 0525  LABPROT 23.4*  INR 2.1*   ABG    Component Value Date/Time   PHART 7.300 (L) 11/02/2021 0151   HCO3 23.7 11/02/2021 0151   TCO2 25 11/02/2021 0151   ACIDBASEDEF 3.0  (H) 11/02/2021 0151   O2SAT 64.9 11/10/2021 0525   CBG (last 3)  Recent Labs    11/09/21 1610 11/09/21 2240 11/10/21 0742  GLUCAP 106* 116* 108*    Assessment/Plan: S/P Procedure(s) (LRB): MITRAL VALVE (MV) REPLACEMENT USING MITRIS VALVE. (N/A) CORONARY ARTERY BYPASS GRAFTING (CABG) X TWO USING OPEN LEFT INTERNAL MAMMARY ARTERY AND ENDOSOPIC RIGHT GREATER SAPHENOUS VEIN HARVEST. (N/A) MAZE (N/A) TRANSESOPHAGEAL ECHOCARDIOGRAM (TEE) (N/A) CLIPPING OF ATRIAL APPENDAGE USING AN ATRICLIP PRO2 (N/A)  NE is now off. He should be fine as long as MAP 65 and systolic 90's.   Still has LE edema and CVP 13. Continuing diuresis with lasix 80 IV bid. Replace K+  INR stable at 2.1 on 2.5 mg Coumadin which should be fine with tissue mitral valve.  Continue IS, ambulation.    LOS: 17 days    [name] 11/10/2021

## 2021-11-10 NOTE — Progress Notes (Addendum)
Patient ID: Keith Jenkins, male   DOB: 1945/08/23, 76 y.o.   MRN: [id number]     Advanced Heart Failure Rounding Note  PCP-Cardiologist: None   Subjective:    05/19: Admit with a/c CHF. CO-OX okay but appeared low-output >>started milrinone 0.25 05/22: Developed A flutter. Started on amio drip.  05/26: S/p CABG x2 (LIMA-LAD, SVG-RAMUS INT) +MVR, MAZE, LAA Clip  6/1: Barium swallow with distal esophageal stricture. GI consult -> ok to continue po (outpatient f/u for EGD/dilation)  Remains on NE 4 and milrinone 0.125. SBP 90s-100s (SBP never dropped below 90s but NE increased overnight). Co-ox 65%  I/Os net negative, CVP 7.  Still has significant peripheral edema.   No chest pain or dyspnea. Walked in hall today already.    Objective:   Weight Range: 100 kg Body mass index is 29.09 kg/m.   Vital Signs:   Temp:  [96.8 F (36 C)-98.1 F (36.7 C)] 96.8 F (36 C) (06/04 0700) Pulse Rate:  [75-88] 81 (06/04 0600) Resp:  [15-22] 20 (06/03 1500) BP: (86-126)/(54-76) 101/66 (06/04 0600) SpO2:  [93 %-100 %] 95 % (06/04 0600) Weight:  [100 kg] 100 kg (06/04 0500) Last BM Date : 11/09/21  Weight change: Filed Weights   11/08/21 0500 11/09/21 0500 11/10/21 0500  Weight: 99 kg 100.2 kg 100 kg    Intake/Output:   Intake/Output Summary (Last 24 hours) at 11/10/2021 0802 Last data filed at 11/10/2021 0600 Gross per 24 hour  Intake 408.86 ml  Output 1900 ml  Net -1491.14 ml      Physical Exam   General: NAD Neck: JVP 8-9 cm, no thyromegaly or thyroid nodule.  Lungs: Clear to auscultation bilaterally with normal respiratory effort. CV: Nondisplaced PMI.  Heart regular S1/S2, no S3/S4, no murmur.  1+ edema to knees.  Abdomen: Soft, nontender, no hepatosplenomegaly, no distention.  Skin: Intact without lesions or rashes.  Neurologic: Alert and oriented x 3.  Psych: Normal affect. Extremities: No clubbing or cyanosis.  HEENT: Normal.   Telemetry   SR 70s. Personally  reviewed   Labs    CBC Recent Labs    11/07/21 0920  WBC 10.2  HGB 8.3*  HCT 25.2*  MCV 86.9  PLT 0000000   Basic Metabolic Panel Recent Labs    11/09/21 0508 11/10/21 0525  NA 128* 130*  K 3.8 3.3*  CL 93* 93*  CO2 29 29  GLUCOSE 105* 128*  BUN 39* 37*  CREATININE 1.05 1.06  CALCIUM 7.5* 7.2*   Liver Function Tests No results for input(s): AST, ALT, ALKPHOS, BILITOT, PROT, ALBUMIN in the last 72 hours.  No results for input(s): LIPASE, AMYLASE in the last 72 hours. Cardiac Enzymes No results for input(s): CKTOTAL, CKMB, CKMBINDEX, TROPONINI in the last 72 hours.  BNP: BNP (last 3 results) Recent Labs    10/10/21 1606 10/22/21 1623 10/24/21 2150  BNP 1,564.6* 1,226.7* 1,306.1*    ProBNP (last 3 results) No results for input(s): PROBNP in the last 8760 hours.   D-Dimer No results for input(s): DDIMER in the last 72 hours. Hemoglobin A1C No results for input(s): HGBA1C in the last 72 hours.  Fasting Lipid Panel No results for input(s): CHOL, HDL, LDLCALC, TRIG, CHOLHDL, LDLDIRECT in the last 72 hours. Thyroid Function Tests No results for input(s): TSH, T4TOTAL, T3FREE, THYROIDAB in the last 72 hours.  Invalid input(s): FREET3  Other results:   Imaging    No results found.   Medications:     Scheduled Medications:  amiodarone  200 mg Oral BID   aspirin  81 mg Oral Daily   atorvastatin  80 mg Oral Daily   bisacodyl  10 mg Oral Daily   Or   bisacodyl  10 mg Rectal Daily   Chlorhexidine Gluconate Cloth  6 each Topical Q0600   digoxin  0.125 mg Oral Daily   docusate  200 mg Oral BID   escitalopram  10 mg Oral Daily   feeding supplement  237 mL Oral TID WC & HS   furosemide  80 mg Intravenous BID   insulin aspart  0-24 Units Subcutaneous TID WC   insulin detemir  25 Units Subcutaneous Daily   latanoprost  1 drop Both Eyes QHS   lidocaine  2 patch Transdermal Q24H   mouth rinse  15 mL Mouth Rinse BID   midodrine  10 mg Oral TID WC    multivitamin  15 mL Oral Daily   pantoprazole (PROTONIX) IV  40 mg Intravenous Q12H   potassium chloride  20 mEq Per Tube Q4H   potassium chloride  40 mEq Oral Once   senna  2 tablet Oral QHS   sodium chloride flush  10-40 mL Intracatheter Q12H   sodium chloride flush  3 mL Intravenous Q12H   warfarin  2.5 mg Oral q1600   Warfarin - Physician Dosing Inpatient   Does not apply q1600    Infusions:  sodium chloride     lactated ringers     milrinone 0.125 mcg/kg/min (11/10/21 0600)   norepinephrine (LEVOPHED) Adult infusion 4 mcg/min (11/10/21 0600)    PRN Medications: morphine injection, ondansetron (ZOFRAN) IV, oxyCODONE, sodium chloride flush, sodium chloride flush, traMADol    Patient Profile   76 y.o. male with hx COPD, prediabetes, HLD, GERD. Recent admit earlier this month with later presentation inferior STEMI and new systolic CHF w/ moderate to severe ischemic MR.   Readmitted with a/c CHF. Optimized from HF standpoint. Underwent CABG w MVR, MAZE and LAA clip 5/26.   Assessment/Plan   1. CAD/recent late presentation inferior STEMI - LHC 05/04: 100% m RCA and 90% m LAD.  - RCA managed medically d/t completed infarct.  - Echo (5/23) EF 25%, RV moderately reduced, moderate to severe MR - 5/26: S/p CABG x 2 (LIMA-LAD, SVG-Ramus INT)   - ASA + high intensity statin  - No chest pain.    2. Acute on chronic systolic CHF with low-output/ICM - s/p late presentation inferior STEMI 05/23 - Echo 05/23 EF 25-30%, RV moderately reduced, moderate to severe MR. - Limited echo on admit EF 30-35%, RV moderately reduced, MR not well evaluated, moderate pericardial effusion - S/p CABG + bioprosthetic MVR 5/26 - on Milrinone 0.125, NE 4. CO-OX stable at 65% - Wean off NE for goal SBP > 85, increase midodrine to 10 mg tid.  - CVP 7 but still with significant peripheral edema and weight not down.  Lasix 80 mg IV bid x 2 doses today and replace K.  - Continue digoxin 0.125, check level  in am.  - Eventually add back GDMT as blood pressure allows - Given age, fraility and reduced LV function suspect may be slow inotrope wean.   3. Mitral regurgitation - Moderate to severe ischemic MR on TEE.  - S/p bioprosthetic MVR 11/02/21 - On warfarin per TCTS.    4. Aortic valve insufficiency - Mild to moderate AS/AI - stable.    5. Pericardial effusion/Acute Pericarditis  - Moderate in size on echo, no respiratory  variation or RV collapse to suggest tamponade - acute pericarditis/ adhensions noted at time of CABG - CTs out   6. H/o AKI - Creatinine stable today at 1.06.  - Watch renal function closely    7. Prediabetes - A1c 5.8 1/23 - holding [place] for now   8. PAF - s/p MAZE w/ CABG  - maintaining SR. continue amiodarone while on inotrope support - warfarin started, INR 2.1 today.  9. Hyponatremia  - Na 133 > 131 > 130 > 128 > 131 > 128 > 128 > 130 today - Fluid restrict.  10. Post-op ileus/esophageal stricture - also has h/o esophageal stricture (was unable to pass TEE probe transgastric prior to CABG) - Barium swallow with distal stricture. GI has seen. Appreciate recs. Outpatient EGD with dilation  11. Post-op anemia - Needs CBC today.   Continue to mobilize. Aggressive PT/OT. Cardiac rehab.  CRITICAL CARE Performed by: Loralie Champagne  Total critical care time: 35 minutes  Critical care time was exclusive of separately billable procedures and treating other patients.  Critical care was necessary to treat or prevent imminent or life-threatening deterioration.  Critical care was time spent personally by me (independent of midlevel providers or residents) on the following activities: development of treatment plan with patient and/or surrogate as well as nursing, discussions with consultants, evaluation of patient's response to treatment, examination of patient, obtaining history from patient or surrogate, ordering and performing treatments and  interventions, ordering and review of laboratory studies, ordering and review of radiographic studies, pulse oximetry and re-evaluation of patient's condition.    Length of Stay: 60  Loralie Champagne, MD  11/10/2021, 8:02 AM  Advanced Heart Failure Team Pager [phone] (M-F; 7a - 5p)  Please contact [facility] for night-coverage after hours (5p -7a ) and weekends on [email or web address]

## 2021-11-11 DIAGNOSIS — I5043 Acute on chronic combined systolic (congestive) and diastolic (congestive) heart failure: Secondary | ICD-10-CM | POA: Diagnosis not present

## 2021-11-11 LAB — CBC
HCT: 23.8 % — ABNORMAL LOW (ref 39.0–52.0)
Hemoglobin: 8.1 g/dL — ABNORMAL LOW (ref 13.0–17.0)
MCH: 28.7 pg (ref 26.0–34.0)
MCHC: 34 g/dL (ref 30.0–36.0)
MCV: 84.4 fL (ref 80.0–100.0)
Platelets: 279 10*3/uL (ref 150–400)
RBC: 2.82 MIL/uL — ABNORMAL LOW (ref 4.22–5.81)
RDW: 14.6 % (ref 11.5–15.5)
WBC: 12.8 10*3/uL — ABNORMAL HIGH (ref 4.0–10.5)
nRBC: 0.2 % (ref 0.0–0.2)

## 2021-11-11 LAB — GLUCOSE, CAPILLARY
Glucose-Capillary: 113 mg/dL — ABNORMAL HIGH (ref 70–99)
Glucose-Capillary: 148 mg/dL — ABNORMAL HIGH (ref 70–99)
Glucose-Capillary: 99 mg/dL (ref 70–99)
Glucose-Capillary: 192 mg/dL — ABNORMAL HIGH (ref 70–99)

## 2021-11-11 LAB — BASIC METABOLIC PANEL
Anion gap: 5 (ref 5–15)
Anion gap: 7 (ref 5–15)
BUN: 39 mg/dL — ABNORMAL HIGH (ref 8–23)
BUN: 39 mg/dL — ABNORMAL HIGH (ref 8–23)
CO2: 29 mmol/L (ref 22–32)
CO2: 29 mmol/L (ref 22–32)
Calcium: 7.6 mg/dL — ABNORMAL LOW (ref 8.9–10.3)
Calcium: 7.9 mg/dL — ABNORMAL LOW (ref 8.9–10.3)
Chloride: 93 mmol/L — ABNORMAL LOW (ref 98–111)
Chloride: 93 mmol/L — ABNORMAL LOW (ref 98–111)
Creatinine, Ser: 1.17 mg/dL (ref 0.61–1.24)
Creatinine, Ser: 1.13 mg/dL (ref 0.61–1.24)
GFR, Estimated: >60 mL/min (ref >60)
GFR, Estimated: >60 mL/min (ref >60)
Glucose, Bld: 122 mg/dL — ABNORMAL HIGH (ref 70–99)
Glucose, Bld: 111 mg/dL — ABNORMAL HIGH (ref 70–99)
Potassium: 4.5 mmol/L (ref 3.5–5.1)
Potassium: 4.7 mmol/L (ref 3.5–5.1)
Sodium: 127 mmol/L — ABNORMAL LOW (ref 135–145)
Sodium: 129 mmol/L — ABNORMAL LOW (ref 135–145)

## 2021-11-11 LAB — HEPATIC FUNCTION PANEL
ALT: 78 U/L — ABNORMAL HIGH (ref 0–44)
AST: 56 U/L — ABNORMAL HIGH (ref 15–41)
Albumin: 2.4 g/dL — ABNORMAL LOW (ref 3.5–5.0)
Alkaline Phosphatase: 107 U/L (ref 38–126)
Bilirubin, Direct: 0.3 mg/dL — ABNORMAL HIGH (ref 0.0–0.2)
Indirect Bilirubin: 0.8 mg/dL (ref 0.3–0.9)
Total Bilirubin: 1.1 mg/dL (ref 0.3–1.2)
Total Protein: 5.6 g/dL — ABNORMAL LOW (ref 6.5–8.1)

## 2021-11-11 LAB — DIGOXIN LEVEL: Digoxin Level: 0.7 ng/mL — ABNORMAL LOW (ref 0.8–2.0)

## 2021-11-11 LAB — SODIUM
Sodium: 130 mmol/L — ABNORMAL LOW (ref 135–145)
Sodium: 128 mmol/L — ABNORMAL LOW (ref 135–145)

## 2021-11-11 LAB — COOXEMETRY PANEL
Carboxyhemoglobin: 1.9 % — ABNORMAL HIGH (ref 0.5–1.5)
Methemoglobin: 0.7 % (ref 0.0–1.5)
O2 Saturation: 64.3 %
Total hemoglobin: 7.6 g/dL — ABNORMAL LOW (ref 12.0–16.0)

## 2021-11-11 LAB — PROTIME-INR
INR: 2.7 — ABNORMAL HIGH (ref 0.8–1.2)
Prothrombin Time: 28.6 seconds — ABNORMAL HIGH (ref 11.4–15.2)

## 2021-11-11 MED ORDER — PANTOPRAZOLE SODIUM 40 MG PO TBEC
40.0000 mg | DELAYED_RELEASE_TABLET | Freq: Two times a day (BID) | ORAL | Status: DC
  Administered 2021-11-11 – 2021-11-18 (×15): 40 mg via ORAL
  Filled 2021-11-11 (×15): qty 1

## 2021-11-11 MED ORDER — WARFARIN SODIUM 1 MG PO TABS
1.0000 mg | ORAL_TABLET | Freq: Every day | ORAL | Status: DC
  Administered 2021-11-11 – 2021-11-17 (×7): 1 mg via ORAL
  Filled 2021-11-11 (×9): qty 1

## 2021-11-11 MED ORDER — FUROSEMIDE 10 MG/ML IJ SOLN
80.0000 mg | Freq: Two times a day (BID) | INTRAMUSCULAR | Status: AC
  Administered 2021-11-11 (×2): 80 mg via INTRAVENOUS
  Filled 2021-11-11 (×2): qty 8

## 2021-11-11 MED ORDER — TOLVAPTAN 15 MG PO TABS: 15.0000 mg | ORAL_TABLET | Freq: Once | ORAL | Status: DC

## 2021-11-11 MED ORDER — TOLVAPTAN 15 MG PO TABS
15.0000 mg | ORAL_TABLET | Freq: Once | ORAL | Status: AC
  Administered 2021-11-11: 15 mg via ORAL
  Filled 2021-11-11: qty 1

## 2021-11-11 MED ORDER — POTASSIUM CHLORIDE 20 MEQ PO PACK
40.0000 meq | PACK | Freq: Once | ORAL | Status: AC
Start: 2021-11-11 — End: 2021-11-11
  Administered 2021-11-11: 40 meq via ORAL
  Filled 2021-11-11: qty 2

## 2021-11-11 NOTE — Progress Notes (Signed)
Occupational Therapy Treatment and Discharge Patient Details Name: Keith Jenkins MRN: [id number] DOB: [birth year] Today's Date: 11/11/2021   History of present illness Pt is a 76 y.o. male admitted 10/24/21 witih SOB and chest pain after recent STEMI. Workup for CHF, aflutter. S/p CABG, MVR 5/26. PMH includes pre-DM, HLD, arthritis, glaucoma.   OT comments  This 76 yo male admitted with above seen today with all education completed with wife and pt, no further questions or concerns from pt or wife on basic ADLs. Acute OT will sign off.   Recommendations for follow up therapy are one component of a multi-disciplinary discharge planning process, led by the attending physician.  Recommendations may be updated based on patient status, additional functional criteria and insurance authorization.    Follow Up Recommendations  No OT follow up    Assistance Recommended at Discharge Intermittent Supervision/Assistance  Patient can return home with the following  A little help with bathing/dressing/bathroom;A little help with walking and/or transfers;Assistance with cooking/housework;Assist for transportation;Direct supervision/assist for financial management;Direct supervision/assist for medications management   Equipment Recommendations  None recommended by OT       Precautions / Restrictions Precautions Precautions: Fall;Sternal Restrictions Other Position/Activity Restrictions: Sternal precautions       Mobility Bed Mobility               General bed mobility comments: pt out of bed in recliner    Transfers Overall transfer level: Needs assistance Equipment used: None Transfers: Sit to/from Stand Sit to Stand: Min guard           General transfer comment: increased time, rocking, came up on his own with minguard A on 3rd rock up     Balance Overall balance assessment: Needs assistance Sitting-balance support: No upper extremity supported, Feet supported Sitting  balance-Leahy Scale: Good     Standing balance support: No upper extremity supported Standing balance-Leahy Scale: Fair                             ADL either performed or assessed with clinical judgement   ADL Overall ADL's : Needs assistance/impaired Eating/Feeding: Independent;Sitting   Grooming: Supervision/safety;Set up;Sitting   Upper Body Bathing: Supervision/ safety;Set up;Sitting   Lower Body Bathing: Min guard;Sit to/from stand Lower Body Bathing Details (indicate cue type and reason): can cross his legs to get to his feet and also has wife to A prn Upper Body Dressing : Minimal assistance;Sitting   Lower Body Dressing: Min guard;Sit to/from stand Lower Body Dressing Details (indicate cue type and reason): can cross his legs to get to his feet and also has wife to A prn   Toilet Transfer Details (indicate cue type and reason): min guard A sit<>stand (simulated from recliner)--pt has a high toilet at home with counter top on one side (talked about using the counter top for balance not full support due to sternal precautions. Toileting- Clothing Manipulation and Hygiene: Minimal assistance Toileting - Clothing Manipulation Details (indicate cue type and reason): min guard A sit<>stand from recliner       General ADL Comments: wife will get pt a shower seat with back if needed    Extremity/Trunk Assessment Upper Extremity Assessment Upper Extremity Assessment: Overall WFL for tasks assessed            Vision Ability to See in Adequate Light: 0 Adequate Patient Visual Report: No change from baseline  Cognition Arousal/Alertness: Awake/alert Behavior During Therapy: WFL for tasks assessed/performed Overall Cognitive Status: Within Functional Limits for tasks assessed                                                     Pertinent Vitals/ Pain       Pain Assessment Pain Assessment: Faces Faces Pain Scale: Hurts a  little bit Pain Location: chest incisional with certain movements Pain Descriptors / Indicators: Sore Pain Intervention(s): Limited activity within patient's tolerance         Frequency  Min 2X/week        Progress Toward Goals  OT Goals(current goals can now be found in the care plan section)  Progress towards OT goals:  (all education completed)  Acute Rehab OT Goals Patient Stated Goal: go home this week  Plan Discharge plan remains appropriate       AM-PAC OT "6 Clicks" Daily Activity     Outcome Measure   Help from another person eating meals?: None Help from another person taking care of personal grooming?: A Little Help from another person toileting, which includes using toliet, bedpan, or urinal?: A Little Help from another person bathing (including washing, rinsing, drying)?: A Little Help from another person to put on and taking off regular upper body clothing?: A Little Help from another person to put on and taking off regular lower body clothing?: A Little 6 Click Score: 19    End of Session    OT Visit Diagnosis: Unsteadiness on feet (R26.81);Other abnormalities of gait and mobility (R26.89);Muscle weakness (generalized) (M62.81);Pain Pain - part of body:  (incisional)   Activity Tolerance Patient tolerated treatment well   Patient Left in chair;with call bell/phone within reach;with family/visitor present   Nurse Communication  (pt particpated with me)        Time: 3220-2542 OT Time Calculation (min): 11 min  Charges: OT General Charges $OT Visit: 1 Visit OT Treatments $Self Care/Home Management : 8-22 mins  Ignacia Palma, OTR/L Acute Rehab Services [facility] [phone] Office [phone]    Evette Georges 11/11/2021, 12:15 PM

## 2021-11-11 NOTE — Progress Notes (Signed)
Nutrition Follow-up  DOCUMENTATION CODES:   Not applicable  INTERVENTION:   Pt will need to rely heavily on oral nutrition supplements such as Ensure and Magic Cup to meet nutritional needs while inpatient. As outpatient, wife is very familiar with cooking soft, moist foods that pt can tolerate and eat well. Pt to continue to utilize oral nutrition supplements post discharge, especially until GI follow-up  Continue Dysphagia 2 diet; add gravy to all meats, xrtra container of gravy on meal trays to [employer] Enlive po QID, each supplement provides 350 kcal and 20 grams of protein.  Magic cup TID with meals, each supplement provides 290 kcal and 9 grams of protein  Vital Cuisine Shake TID on meal trays per pt request, each supplement provides 520 kcal and 22 grams of protein  Add liquid MVI   NUTRITION DIAGNOSIS:   Increased nutrient needs related to chronic illness (COPD, CHF) as evidenced by estimated needs.  Being addressed via supplements  GOAL:   Patient will meet greater than or equal to 90% of their needs  Progressing  MONITOR:   PO intake, Supplement acceptance, Labs, Weight trends, Skin, I & O's  REASON FOR ASSESSMENT:   Malnutrition Screening Tool    ASSESSMENT:   76 y.o. male with  a history of COPD, prediabetes, GERD, hyperlipidemia who presents for the evaluation of chest pain. Pt with acute combined CHF/ICM.  Noted plan for outpatient EGD with dilation but may be end of August at the latest  Pt sitting up in chair on visit today; eating "BoRounds" from [employer] upon visit with family today.   Pt reports he did not receive breakfast this AM (ordered Rice Krispies and milk); RN called down and requested breakfast tray but house tray was sent and pt did not eat anything from it. Pt did drink 1 Ensure.  Pt reports he does not like the food from [employer]; he also indicates that everything they send him is hard/tough and dry. RD to send  extra gravy on meal trays to moisten foods. RD also adding foods to tray that pt likes to eat including rice krispies with milk and mashed potatoes and gravy  Pt reports he is drinking 3-4 Ensure per day; pt is also eating Magic Cup supplements. Pt also reports he has received V[employer] Cuisine shakes from dining services as well; plan to add to meal trays per pt request.   Based on above, pt is getting a good amount of calorie and protein from supplements.   PO intake from meals over the weekend was variable. Appears to be eating better but definitely limited given pt does not like the food, food is dry/tough  Labs: sodium 129 (L), CBGs 113-186 Meds: colae, lasix, novolog with meals, levemir    Diet Order:   Diet Order             DIET DYS 2 Room service appropriate? Yes; Fluid consistency: Thin; Fluid restriction: 1800 mL Fluid  Diet effective now                   EDUCATION NEEDS:   Not appropriate for education at this time  Skin:  Skin Assessment: Skin Integrity Issues: Skin Integrity Issues:: DTI DTI: buttocks  Last BM:  6/04  Height:   Ht Readings from Last 1 Encounters:  10/24/21 6\' 1"  (1.854 m)    Weight:   Wt Readings from Last 1 Encounters:  11/11/21 99.7 kg    BMI:  Body  mass index is 29 kg/m.  Estimated Nutritional Needs:   Kcal:  6226-3335  Protein:  115-125 grams  Fluid:  >/= 2 L/day   Keith Starcher MS, RDN, LDN, CNSC Registered Dietitian III Clinical Nutrition RD Pager and On-Call Pager Number Located in [place]

## 2021-11-11 NOTE — Progress Notes (Signed)
Physical Therapy Treatment Patient Details Name: Keith Jenkins MRN: [id number] DOB: Aug 20, 1945 Today's Date: 11/11/2021   History of Present Illness Pt is a 76 y.o. male admitted 10/24/21 witih SOB and chest pain after recent STEMI. Workup for CHF, aflutter. S/p CABG, MVR 5/26. PMH includes pre-DM, HLD, arthritis, glaucoma.    PT Comments    Patient progressing slowly towards PT goals. Session focused on gait progression and functional mobility. Tolerated gait training with use of rollator for support. Education on safe rollator use/brakes etc. Noted to have 2 standing rest breaks, VSS on RA with 2-3/4 DOE. Pt fatigues towards end of session. Reviewed sternal precautions and needs min cues to adhere to them during mobility. Continues to demonstrate deficits relating to strength, endurance and activity tolerance. Will continue to follow and progress as tolerated.    Recommendations for follow up therapy are one component of a multi-disciplinary discharge planning process, led by the attending physician.  Recommendations may be updated based on patient status, additional functional criteria and insurance authorization.  Follow Up Recommendations  Home health PT     Assistance Recommended at Discharge Intermittent Supervision/Assistance  Patient can return home with the following A little help with walking and/or transfers;A little help with bathing/dressing/bathroom;Assistance with cooking/housework;Assist for transportation;Help with stairs or ramp for entrance   Equipment Recommendations  Rollator (4 wheels)    Recommendations for Other Services       Precautions / Restrictions Precautions Precautions: Fall;Sternal Precaution Booklet Issued: No Precaution Comments: Reviewed sternal precautions Restrictions Weight Bearing Restrictions: Yes Other Position/Activity Restrictions: Sternal precautions     Mobility  Bed Mobility Overal bed mobility: Needs Assistance Bed Mobility:  Rolling, Sidelying to Sit Rolling: Min assist Sidelying to sit: Min assist, HOB elevated       General bed mobility comments: Cues for log roll technique, assist with elevating trunk to get to EOB.    Transfers Overall transfer level: Needs assistance Equipment used: Rollator (4 wheels) Transfers: Sit to/from Stand Sit to Stand: Min assist           General transfer comment: Min A to power to standing with cues for momentum and hand placement, not able to stand on first attempt but able to stand on second attempt. Stood from [employer], transferred to chair post ambulation.    Ambulation/Gait Ambulation/Gait assistance: Min guard Gait Distance (Feet): 350 Feet Assistive device: Rollator (4 wheels) Gait Pattern/deviations: Step-through pattern, Decreased stride length, Trunk flexed Gait velocity: Decreased Gait velocity interpretation: <1.31 ft/sec, indicative of household ambulator   General Gait Details: slow, fatigued gait with rollator and min guard for balance;2 standing rest breaks. 2-3/4 DOE. HR 70s-80s bpm.   Stairs             Wheelchair Mobility    Modified Rankin (Stroke Patients Only)       Balance Overall balance assessment: Needs assistance Sitting-balance support: No upper extremity supported, Feet supported Sitting balance-Leahy Scale: Good     Standing balance support: During functional activity Standing balance-Leahy Scale: Fair Standing balance comment: can static stand and take steps without UE support, min guard                            Cognition Arousal/Alertness: Awake/alert Behavior During Therapy: WFL for tasks assessed/performed Overall Cognitive Status: Within Functional Limits for tasks assessed  Exercises      General Comments General comments (skin integrity, edema, etc.): Wife present during session. BP pre activity 98/66, post activity BP 123/83.       Pertinent Vitals/Pain Pain Assessment Pain Assessment: No/denies pain    Home Living                          Prior Function            PT Goals (current goals can now be found in the care plan section) Progress towards PT goals: Progressing toward goals    Frequency    Min 3X/week      PT Plan Current plan remains appropriate    Co-evaluation              AM-PAC PT "6 Clicks" Mobility   Outcome Measure  Help needed turning from your back to your side while in a flat bed without using bedrails?: A Little Help needed moving from lying on your back to sitting on the side of a flat bed without using bedrails?: A Little Help needed moving to and from a bed to a chair (including a wheelchair)?: A Little Help needed standing up from a chair using your arms (e.g., wheelchair or bedside chair)?: A Little Help needed to walk in hospital room?: A Little Help needed climbing 3-5 steps with a railing? : A Lot 6 Click Score: 17    End of Session Equipment Utilized During Treatment: Gait belt Activity Tolerance: Patient tolerated treatment well Patient left: in chair;with call bell/phone within reach;with family/visitor present Nurse Communication: Mobility status PT Visit Diagnosis: Other abnormalities of gait and mobility (R26.89);Muscle weakness (generalized) (M62.81);Unsteadiness on feet (R26.81);Difficulty in walking, not elsewhere classified (R26.2)     Time: 1030-1057 PT Time Calculation (min) (ACUTE ONLY): 27 min  Charges:  $Gait Training: 8-22 mins $Therapeutic Activity: 8-22 mins                     Marisa Severin, PT, DPT Acute Rehabilitation Services Secure chat preferred Office [place] 11/11/2021, 12:43 PM

## 2021-11-11 NOTE — Progress Notes (Addendum)
      [address]       [place] [address]             [phone]      10 Days Post-Op Procedure(s) (LRB): MITRAL VALVE (MV) REPLACEMENT USING MITRIS VALVE. (N/A) CORONARY ARTERY BYPASS GRAFTING (CABG) X TWO USING OPEN LEFT INTERNAL MAMMARY ARTERY AND ENDOSOPIC RIGHT GREATER SAPHENOUS VEIN HARVEST. (N/A) MAZE (N/A) TRANSESOPHAGEAL ECHOCARDIOGRAM (TEE) (N/A) CLIPPING OF ATRIAL APPENDAGE USING AN ATRICLIP PRO2 (N/A)  Subjective:  No complaints, denies chest pain, shortness of breath.  + ambulation  Objective: Vital signs in last 24 hours: Temp:  [96.7 F (35.9 C)-98.5 F (36.9 C)] 96.7 F (35.9 C) (06/05 1111) Pulse Rate:  [70-81] 80 (06/05 1100) Cardiac Rhythm: Normal sinus rhythm (06/05 0800) BP: (84-125)/(55-91) 125/91 (06/05 1100) SpO2:  [89 %-100 %] 96 % (06/05 1100) Weight:  [99.7 kg-100 kg] 99.7 kg (06/05 0700)  Hemodynamic parameters for last 24 hours: CVP:  [7 mmHg-79 mmHg] 11 mmHg  Intake/Output from previous day: 06/04 0701 - 06/05 0700 In: 1296.7 [P.O.:1200; I.V.:96.7] Out: 1825 [Urine:1825] Intake/Output this shift: Total I/O In: 257.4 [P.O.:240; I.V.:17.4] Out: 700 [Urine:700]  General appearance: alert, cooperative, and no distress Heart: regular rate and rhythm Lungs: clear to auscultation bilaterally Abdomen: soft, non-tender; bowel sounds normal; no masses,  no organomegaly Extremities: edema + in LE Wound: clean and dry  Lab Results: Recent Labs    11/10/21 0910 11/11/21 0346  WBC 11.8* 12.8*  HGB 8.4* 8.1*  HCT 24.8* 23.8*  PLT 279 279   BMET:  Recent Labs    11/11/21 0346 11/11/21 0808  NA 127* 129*  K 4.5 4.7  CL 93* 93*  CO2 29 29  GLUCOSE 122* 111*  BUN 39* 39*  CREATININE 1.17 1.13  CALCIUM 7.6* 7.9*    PT/INR:  Recent Labs    11/11/21 0346  LABPROT 28.6*  INR 2.7*   ABG    Component Value Date/Time   PHART 7.300 (L) 11/02/2021 0151   HCO3 23.7 11/02/2021 0151   TCO2 25 11/02/2021  0151   ACIDBASEDEF 3.0 (H) 11/02/2021 0151   O2SAT 64.3 11/11/2021 0330   CBG (last 3)  Recent Labs    11/10/21 2158 11/11/21 0652 11/11/21 1109  GLUCAP 150* 113* 148*    Assessment/Plan: S/P Procedure(s) (LRB): MITRAL VALVE (MV) REPLACEMENT USING MITRIS VALVE. (N/A) CORONARY ARTERY BYPASS GRAFTING (CABG) X TWO USING OPEN LEFT INTERNAL MAMMARY ARTERY AND ENDOSOPIC RIGHT GREATER SAPHENOUS VEIN HARVEST. (N/A) MAZE (N/A) TRANSESOPHAGEAL ECHOCARDIOGRAM (TEE) (N/A) CLIPPING OF ATRIAL APPENDAGE USING AN ATRICLIP PRO2 (N/A)  CV- maintaining NSR, remains on Milrinone with Co-ox of 64 this morning.. weaning per AHF, remains on Midodrine, Amiodarone, Digoxin INR 2.7, continue coumadin at 1 mg daily Pulm- no acute issues, off oxygen, continue IS Renal- creatinine stable, remains volume overloaded, currently on IV Lasix 80 mg BID Hyponatremia- received Tolvaptan today, level at 127 Deconditioning- mild working with PT/OT home health orders placed Dispo- patient making progress, weaning Milrinone as able, diuretics per AHF, hyponatremic received a dose of Tolvaptan today, continue current care   LOS: 18 days    Lowella Dandy, PA-C 11/11/2021   Chart reviewed, patient examined, agree with above. Remains on milrinone 0.125 and midodrine with marginal but adequate BP. Wt is still 14 lbs over preop and fairly stable. Start on Tolvaptan per AHF.

## 2021-11-11 NOTE — Progress Notes (Signed)
CT surgery PM rounds  Patient had a good day, walked in hallway Maintaining sinus rhythm and stable blood pressure Currently on bedside commode Continue current care  Blood pressure 100/65, pulse 75, temperature (!) 96.7 F (35.9 C), temperature source Axillary, resp. rate 20, height 6\' 1"  (1.854 m), weight 99.7 kg, SpO2 96 %.

## 2021-11-11 NOTE — Progress Notes (Addendum)
Patient ID: Keith Jenkins, male   DOB: 1946-05-22, 76 y.o.   MRN: [id number]     Advanced Heart Failure Rounding Note  PCP-Cardiologist: None   Subjective:    05/19: Admit with a/c CHF. CO-OX okay but appeared low-output >>started milrinone 0.25 05/22: Developed A flutter. Started on amio drip.  05/26: S/p CABG x2 (LIMA-LAD, SVG-RAMUS INT) +MVR, MAZE, LAA Clip  6/1: Barium swallow with distal esophageal stricture. GI consult -> ok to continue po (outpatient f/u for EGD/dilation) 6/4: Midodrine increased to 10 mg tid. Diuresed with IV lasix. Norepi stopped.   Remains on milrinone 0.125 mcg. CO-OX 64%.   Negative 528.   Denies pain. Denies shortness of breath. Walked around the unit x2.    Objective:   Weight Range: 100 kg Body mass index is 29.09 kg/m.   Vital Signs:   Temp:  [98.3 F (36.8 C)-98.5 F (36.9 C)] 98.5 F (36.9 C) (06/05 0400) Pulse Rate:  [70-83] 74 (06/05 0600) BP: (84-125)/(55-78) 99/62 (06/05 0600) SpO2:  [93 %-100 %] 93 % (06/05 0600) Weight:  [100 kg] 100 kg (06/05 0500) Last BM Date : 11/10/21  Weight change: Filed Weights   11/09/21 0500 11/10/21 0500 11/11/21 0500  Weight: 100.2 kg 100 kg 100 kg    Intake/Output:   Intake/Output Summary (Last 24 hours) at 11/11/2021 0701 Last data filed at 11/11/2021 0600 Gross per 24 hour  Intake 1296.72 ml  Output 1825 ml  Net -528.28 ml      Physical Exam  CVP 11-12  General:  Sitting on the side of the bed.  No resp difficulty HEENT: normal Neck: supple. JVP 11-12. Carotids 2+ bilat; no bruits. No lymphadenopathy or thryomegaly appreciated. Cor: PMI nondisplaced. Regular rate & rhythm. No rubs, gallops or murmurs. Sternal incision approximated.  Lungs: clear Abdomen: soft, nontender, nondistended. No hepatosplenomegaly. No bruits or masses. Good bowel sounds. Extremities: no cyanosis, clubbing, rash, R and LLE 1+ edema. RUE PICC  Neuro: alert & orientedx3, cranial nerves grossly intact. moves all 4  extremities w/o difficulty. Affect pleasant  Telemetry   SR 70-80s personally checked.    Labs    CBC Recent Labs    11/10/21 0910 11/11/21 0346  WBC 11.8* 12.8*  HGB 8.4* 8.1*  HCT 24.8* 23.8*  MCV 85.2 84.4  PLT 279 [id number]   Basic Metabolic Panel Recent Labs    11/10/21 0525 11/11/21 0346  NA 130* 127*  K 3.3* 4.5  CL 93* 93*  CO2 29 29  GLUCOSE 128* 122*  BUN 37* 39*  CREATININE 1.06 1.17  CALCIUM 7.2* 7.6*   Liver Function Tests No results for input(s): AST, ALT, ALKPHOS, BILITOT, PROT, ALBUMIN in the last 72 hours.  No results for input(s): LIPASE, AMYLASE in the last 72 hours. Cardiac Enzymes No results for input(s): CKTOTAL, CKMB, CKMBINDEX, TROPONINI in the last 72 hours.  BNP: BNP (last 3 results) Recent Labs    10/10/21 1606 10/22/21 1623 10/24/21 2150  BNP 1,564.6* 1,226.7* 1,306.1*    ProBNP (last 3 results) No results for input(s): PROBNP in the last 8760 hours.   D-Dimer No results for input(s): DDIMER in the last 72 hours. Hemoglobin A1C No results for input(s): HGBA1C in the last 72 hours.  Fasting Lipid Panel No results for input(s): CHOL, HDL, LDLCALC, TRIG, CHOLHDL, LDLDIRECT in the last 72 hours. Thyroid Function Tests No results for input(s): TSH, T4TOTAL, T3FREE, THYROIDAB in the last 72 hours.  Invalid input(s): FREET3  Other results:   Imaging  No results found.   Medications:     Scheduled Medications:  amiodarone  200 mg Oral BID   aspirin  81 mg Oral Daily   atorvastatin  80 mg Oral Daily   bisacodyl  10 mg Oral Daily   Or   bisacodyl  10 mg Rectal Daily   Chlorhexidine Gluconate Cloth  6 each Topical Q0600   digoxin  0.125 mg Oral Daily   docusate  200 mg Oral BID   escitalopram  10 mg Oral Daily   feeding supplement  237 mL Oral TID WC & HS   insulin aspart  0-24 Units Subcutaneous TID WC   insulin detemir  25 Units Subcutaneous Daily   latanoprost  1 drop Both Eyes QHS   lidocaine  2 patch  Transdermal Q24H   mouth rinse  15 mL Mouth Rinse BID   midodrine  10 mg Oral TID WC   multivitamin  15 mL Oral Daily   pantoprazole (PROTONIX) IV  40 mg Intravenous Q12H   senna  2 tablet Oral QHS   sodium chloride flush  10-40 mL Intracatheter Q12H   sodium chloride flush  3 mL Intravenous Q12H   warfarin  2.5 mg Oral q1600   Warfarin - Physician Dosing Inpatient   Does not apply q1600    Infusions:  sodium chloride     lactated ringers     milrinone 0.125 mcg/kg/min (11/11/21 0600)   norepinephrine (LEVOPHED) Adult infusion Stopped (11/10/21 1030)    PRN Medications: morphine injection, ondansetron (ZOFRAN) IV, oxyCODONE, sodium chloride flush, sodium chloride flush, traMADol    Patient Profile   76 y.o. male with hx COPD, prediabetes, HLD, GERD. Recent admit earlier this month with later presentation inferior STEMI and new systolic CHF w/ moderate to severe ischemic MR.   Readmitted with a/c CHF. Optimized from HF standpoint. Underwent CABG w MVR, MAZE and LAA clip 5/26.   Assessment/Plan   1. CAD/recent late presentation inferior STEMI - LHC 05/04: 100% m RCA and 90% m LAD.  - RCA managed medically d/t completed infarct.  - Echo (5/23) EF 25%, RV moderately reduced, moderate to severe MR - 5/26: S/p CABG x 2 (LIMA-LAD, SVG-[place] INT)   - ASA + high intensity statin  - No chest pain.    2. Acute on chronic systolic CHF with low-output/ICM - s/p late presentation inferior STEMI 05/23 - Echo 05/23 EF 25-30%, RV moderately reduced, moderate to severe MR. - Limited echo on admit EF 30-35%, RV moderately reduced, MR not well evaluated, moderate pericardial effusion - S/p CABG + bioprosthetic MVR 5/26 - Now off NE. On Milrinone 0.125.  CO-OX stable at 64% - Continue midodrine to 10 mg tid.  - CVP 11-12. Continue 80 mg IV lasix twice daily. 10-15 pounds to go.  - Continue digoxin 0.125, dig level 0.7.   - Eventually add back GDMT as blood pressure allows - Given age,  fraility and reduced LV function suspect may be slow inotrope wean.   3. Mitral regurgitation - Moderate to severe ischemic MR on TEE.  - S/p bioprosthetic MVR 11/02/21 - On warfarin per TCTS.    4. Aortic valve insufficiency - Mild to moderate AS/AI - stable.    5. Pericardial effusion/Acute Pericarditis  - Moderate in size on echo, no respiratory variation or RV collapse to suggest tamponade - acute pericarditis/ adhensions noted at time of CABG - CTs out   6. H/o AKI - Creatinine stable today at 1.17 - Watch renal  function closely    7. Prediabetes - A1c 5.8 1/23 - holding [place] for now   8. PAF - s/p MAZE w/ CABG  - maintaining SR. continue amiodarone while on inotrope support then will stop.  - warfarin started, INR 2.7 today.  9. Hyponatremia  - Na 133 > 131 > 130 > 128 > 131 > 128 > 128 > 130 >127 today - Fluid restrict.  10. Post-op ileus/esophageal stricture - also has h/o esophageal stricture (was unable to pass TEE probe transgastric prior to CABG) - Barium swallow with distal stricture. GI has seen. Appreciate recs. Outpatient EGD with dilation  11. Post-op anemia - Hgb 8.1.  Continue to mobilize.    Length of Stay: [place], NP  11/11/2021, 7:01 AM  Advanced Heart Failure Team Pager [phone] (M-F; 7a - 5p)  Please contact [facility] for night-coverage after hours (5p -7a ) and weekends on [email or web address]  Patient seen with NP, agree with the above note.   SBP 90s, now off NE.  Remains on milrinone 0.125. Co-ox 64%.  Got Lasix 80 mg IV bid, weight down 1 lb.  CVP remains 11-12.   No complaints.   General: NAD Neck: JVP 12 cm, no thyromegaly or thyroid nodule.  Lungs: Clear to auscultation bilaterally with normal respiratory effort. CV: Nondisplaced PMI.  Heart regular S1/S2, no S3/S4, no murmur.  1+ edema 1/2 to knees.   Abdomen: Soft, nontender, no hepatosplenomegaly, no distention.  Skin: Intact without lesions or rashes.  Neurologic:  Alert and oriented x 3.  Psych: Normal affect. Extremities: No clubbing or cyanosis.  HEENT: Normal.   Still with volume overload, did not diurese vigorously with Lasix yesterday.  - Continue Lasix 80 mg IV bid.  - Add tolvaptan 15 mg daily.  - Continue milrinone 0.125 while diuresing.  - Continue midodrine 10 tid.   Mobilize.   Loralie Champagne 11/11/2021 7:35 AM

## 2021-11-12 DIAGNOSIS — I5043 Acute on chronic combined systolic (congestive) and diastolic (congestive) heart failure: Secondary | ICD-10-CM | POA: Diagnosis not present

## 2021-11-12 LAB — COOXEMETRY PANEL
Carboxyhemoglobin: 1.5 % (ref 0.5–1.5)
Methemoglobin: <0.7 % (ref 0.0–1.5)
O2 Saturation: 63.6 %
Total hemoglobin: <6.9 g/dL — CL (ref 12.0–16.0)

## 2021-11-12 LAB — CBC
HCT: 23.2 % — ABNORMAL LOW (ref 39.0–52.0)
Hemoglobin: 7.8 g/dL — ABNORMAL LOW (ref 13.0–17.0)
MCH: 28.4 pg (ref 26.0–34.0)
MCHC: 33.6 g/dL (ref 30.0–36.0)
MCV: 84.4 fL (ref 80.0–100.0)
Platelets: 286 10*3/uL (ref 150–400)
RBC: 2.75 MIL/uL — ABNORMAL LOW (ref 4.22–5.81)
RDW: 15 % (ref 11.5–15.5)
WBC: 13.4 10*3/uL — ABNORMAL HIGH (ref 4.0–10.5)
nRBC: 0.2 % (ref 0.0–0.2)

## 2021-11-12 LAB — BASIC METABOLIC PANEL
Anion gap: 7 (ref 5–15)
BUN: 41 mg/dL — ABNORMAL HIGH (ref 8–23)
CO2: 30 mmol/L (ref 22–32)
Calcium: 7.9 mg/dL — ABNORMAL LOW (ref 8.9–10.3)
Chloride: 94 mmol/L — ABNORMAL LOW (ref 98–111)
Creatinine, Ser: 1.16 mg/dL (ref 0.61–1.24)
GFR, Estimated: >60 mL/min (ref >60)
Glucose, Bld: 127 mg/dL — ABNORMAL HIGH (ref 70–99)
Potassium: 4.1 mmol/L (ref 3.5–5.1)
Sodium: 131 mmol/L — ABNORMAL LOW (ref 135–145)

## 2021-11-12 LAB — PROTIME-INR
INR: 2.7 — ABNORMAL HIGH (ref 0.8–1.2)
Prothrombin Time: 28.8 seconds — ABNORMAL HIGH (ref 11.4–15.2)

## 2021-11-12 LAB — SODIUM: Sodium: 125 mmol/L — ABNORMAL LOW (ref 135–145)

## 2021-11-12 LAB — GLUCOSE, CAPILLARY
Glucose-Capillary: 98 mg/dL (ref 70–99)
Glucose-Capillary: 135 mg/dL — ABNORMAL HIGH (ref 70–99)
Glucose-Capillary: 100 mg/dL — ABNORMAL HIGH (ref 70–99)
Glucose-Capillary: 112 mg/dL — ABNORMAL HIGH (ref 70–99)

## 2021-11-12 LAB — MAGNESIUM: Magnesium: 2.1 mg/dL (ref 1.7–2.4)

## 2021-11-12 MED ORDER — POTASSIUM CHLORIDE CRYS ER 20 MEQ PO TBCR
40.0000 meq | EXTENDED_RELEASE_TABLET | Freq: Once | ORAL | Status: AC
Start: 2021-11-12 — End: 2021-11-12
  Administered 2021-11-12: 40 meq via ORAL
  Filled 2021-11-12: qty 2

## 2021-11-12 MED ORDER — POTASSIUM CHLORIDE CRYS ER 20 MEQ PO TBCR
40.0000 meq | EXTENDED_RELEASE_TABLET | Freq: Once | ORAL | Status: AC
  Administered 2021-11-12: 40 meq via ORAL
  Filled 2021-11-12: qty 2

## 2021-11-12 MED ORDER — METOLAZONE 2.5 MG PO TABS
2.5000 mg | ORAL_TABLET | Freq: Once | ORAL | Status: AC
  Administered 2021-11-12: 2.5 mg via ORAL
  Filled 2021-11-12: qty 1

## 2021-11-12 MED ORDER — FUROSEMIDE 10 MG/ML IJ SOLN
80.0000 mg | Freq: Two times a day (BID) | INTRAMUSCULAR | Status: AC
  Administered 2021-11-12 (×2): 80 mg via INTRAVENOUS
  Filled 2021-11-12 (×2): qty 8

## 2021-11-12 NOTE — Plan of Care (Signed)
  Problem: Education: Goal: Ability to demonstrate management of disease process will improve Outcome: Progressing Goal: Ability to verbalize understanding of medication therapies will improve Outcome: Progressing Goal: Individualized Educational Video(s) Outcome: Progressing   Problem: Activity: Goal: Capacity to carry out activities will improve Outcome: Progressing   Problem: Cardiac: Goal: Ability to achieve and maintain adequate cardiopulmonary perfusion will improve Outcome: Progressing   Problem: Education: Goal: Will demonstrate proper wound care and an understanding of methods to prevent future damage Outcome: Progressing Goal: Knowledge of disease or condition will improve Outcome: Progressing Goal: Knowledge of the prescribed therapeutic regimen will improve Outcome: Progressing Goal: Individualized Educational Video(s) Outcome: Progressing   Problem: Activity: Goal: Risk for activity intolerance will decrease Outcome: Progressing   Problem: Cardiac: Goal: Will achieve and/or maintain hemodynamic stability Outcome: Progressing   Problem: Clinical Measurements: Goal: Postoperative complications will be avoided or minimized Outcome: Progressing   Problem: Respiratory: Goal: Respiratory status will improve Outcome: Progressing   Problem: Skin Integrity: Goal: Wound healing without signs and symptoms of infection Outcome: Progressing Goal: Risk for impaired skin integrity will decrease Outcome: Progressing   Problem: Urinary Elimination: Goal: Ability to achieve and maintain adequate renal perfusion and functioning will improve Outcome: Progressing

## 2021-11-12 NOTE — Progress Notes (Addendum)
[address]       [place] [address]             [phone]      11 Days Post-Op Procedure(s) (LRB): MITRAL VALVE (MV) REPLACEMENT USING MITRIS VALVE. (N/A) CORONARY ARTERY BYPASS GRAFTING (CABG) X TWO USING OPEN LEFT INTERNAL MAMMARY ARTERY AND ENDOSOPIC RIGHT GREATER SAPHENOUS VEIN HARVEST. (N/A) MAZE (N/A) TRANSESOPHAGEAL ECHOCARDIOGRAM (TEE) (N/A) CLIPPING OF ATRIAL APPENDAGE USING AN ATRICLIP PRO2 (N/A)  Subjective:  Patient sitting up in chair, w/o complaints.    Objective: Vital signs in last 24 hours: Temp:  [96.7 F (35.9 C)-97.8 F (36.6 C)] 97.5 F (36.4 C) (06/06 0400) Pulse Rate:  [70-84] 73 (06/06 0600) Cardiac Rhythm: Normal sinus rhythm (06/06 0008) BP: (87-125)/(54-91) 94/61 (06/06 0400) SpO2:  [91 %-97 %] 95 % (06/06 0600) Weight:  [99.5 kg] 99.5 kg (06/06 0500)  Hemodynamic parameters for last 24 hours: CVP:  [2 mmHg-41 mmHg] 8 mmHg  Intake/Output from previous day: 06/05 0701 - 06/06 0700 In: 956.5 [P.O.:840; I.V.:116.5] Out: 3950 [Urine:3950]  General appearance: alert, cooperative, and no distress Heart: regular rate and rhythm Lungs: clear to auscultation bilaterally Abdomen: soft, non-tender; bowel sounds normal; no masses,  no organomegaly Extremities: edema pitting Wound: clean and dry  Lab Results: Recent Labs    11/11/21 0346 11/12/21 0516  WBC 12.8* 13.4*  HGB 8.1* 7.8*  HCT 23.8* 23.2*  PLT 279 286   BMET:  Recent Labs    11/11/21 0808 11/11/21 1535 11/11/21 2316 11/12/21 0510  NA 129*   < > 125* 131*  K 4.7  --   --  4.1  CL 93*  --   --  94*  CO2 29  --   --  30  GLUCOSE 111*  --   --  127*  BUN 39*  --   --  41*  CREATININE 1.13  --   --  1.16  CALCIUM 7.9*  --   --  7.9*   < > = values in this interval not displayed.    PT/INR:  Recent Labs    11/12/21 0516  LABPROT 28.8*  INR 2.7*   ABG    Component Value Date/Time   PHART 7.300 (L) 11/02/2021 0151   HCO3 23.7  11/02/2021 0151   TCO2 25 11/02/2021 0151   ACIDBASEDEF 3.0 (H) 11/02/2021 0151   O2SAT 63.6 11/12/2021 0516   CBG (last 3)  Recent Labs    11/11/21 1639 11/11/21 2138 11/12/21 0617  GLUCAP 99 192* 98    Assessment/Plan: S/P Procedure(s) (LRB): MITRAL VALVE (MV) REPLACEMENT USING MITRIS VALVE. (N/A) CORONARY ARTERY BYPASS GRAFTING (CABG) X TWO USING OPEN LEFT INTERNAL MAMMARY ARTERY AND ENDOSOPIC RIGHT GREATER SAPHENOUS VEIN HARVEST. (N/A) MAZE (N/A) TRANSESOPHAGEAL ECHOCARDIOGRAM (TEE) (N/A) CLIPPING OF ATRIAL APPENDAGE USING AN ATRICLIP PRO2 (N/A)  CV- NSR, Milrinone at 0.125, Co-ox 63.6, weaning per AHF, on Midodrine, Amiodarone, Digoxin INR at 2.7, continue coumadin at 1 mg daily Pulm- off oxgyen, continue IS Renal- creatinine is at 1.16, continue aggressive diuretics due to hypervolemia and elevated CVP Hyponatremia- improved up to 131 after Tolvaptan Deconditioning- continue PT/OT Dispo- patient stable, hyponatremia improved after Tolvaptan, continue Milrinone wean, diuretics per AHF   LOS: 19 days   Lowella Dandy, PA-C 11/12/2021   Chart reviewed, patient examined, agree with above. Feels ok and walked complete lap early this am. Sitting up waiting for breakfast. I am not sure how he  can be 3L negative and have wt unchanged. Continue diuresis per AHF team. He may benefit from a few days of Tolvaptan.

## 2021-11-12 NOTE — Progress Notes (Addendum)
Patient ID: Keith Jenkins, male   DOB: Feb 05, 1946, 76 y.o.   MRN: [id number]     Advanced Heart Failure Rounding Note  PCP-Cardiologist: None   Subjective:    05/19: Admit with a/c CHF. CO-OX okay but appeared low-output >>started milrinone 0.25 05/22: Developed A flutter. Started on amio drip.  05/26: S/p CABG x2 (LIMA-LAD, SVG-RAMUS INT) +MVR, MAZE, LAA Clip  6/1: Barium swallow with distal esophageal stricture. GI consult -> ok to continue po (outpatient f/u for EGD/dilation) 6/4: Midodrine increased to 10 mg tid. Diuresed with IV lasix. Norepi stopped.  6/5: Given IV lasix + tolvaptan.   Brisk diuressis over the last 24 hours. Weight unchanged?   Remains on milrinone 0.125 mcg. CO-OX 64%   Walked 3 times. Feels ok. Denies pain.   Objective:   Weight Range: 99.5 kg Body mass index is 28.94 kg/m.   Vital Signs:   Temp:  [96.7 F (35.9 C)-97.8 F (36.6 C)] 97.5 F (36.4 C) (06/06 0400) Pulse Rate:  [70-84] 73 (06/06 0600) BP: (87-125)/(54-91) 94/61 (06/06 0400) SpO2:  [91 %-97 %] 95 % (06/06 0600) Weight:  [99.5 kg] 99.5 kg (06/06 0500) Last BM Date : 11/12/21  Weight change: Filed Weights   11/11/21 0500 11/11/21 0700 11/12/21 0500  Weight: 100 kg 99.7 kg 99.5 kg    Intake/Output:   Intake/Output Summary (Last 24 hours) at 11/12/2021 0709 Last data filed at 11/12/2021 0600 Gross per 24 hour  Intake 956.51 ml  Output 3950 ml  Net -2993.49 ml    CVP 9-10   Physical Exam  General: Sitting in the chair.. No resp difficulty HEENT: normal Neck: supple. JVP 9-10 . Carotids 2+ bilat; no bruits. No lymphadenopathy or thryomegaly appreciated. Cor: PMI nondisplaced. Regular rate & rhythm. No rubs, gallops or murmurs.Sternal incision approximated.  Lungs: clear Abdomen: soft, nontender, nondistended. No hepatosplenomegaly. No bruits or masses. Good bowel sounds. Extremities: no cyanosis, clubbing, rash, R and LLE 1+ edema. RUE PICC Neuro: alert & orientedx3, cranial  nerves grossly intact. moves all 4 extremities w/o difficulty. Affect pleasant  Telemetry   Sr 70-80s   Labs    CBC Recent Labs    11/11/21 0346 11/12/21 0516  WBC 12.8* 13.4*  HGB 8.1* 7.8*  HCT 23.8* 23.2*  MCV 84.4 84.4  PLT 279 286   Basic Metabolic Panel Recent Labs    81/85/63 0808 11/11/21 1535 11/11/21 2316 11/12/21 0510  NA 129*   < > 125* 131*  K 4.7  --   --  4.1  CL 93*  --   --  94*  CO2 29  --   --  30  GLUCOSE 111*  --   --  127*  BUN 39*  --   --  41*  CREATININE 1.13  --   --  1.16  CALCIUM 7.9*  --   --  7.9*  MG  --   --   --  2.1   < > = values in this interval not displayed.   Liver Function Tests Recent Labs    11/11/21 0808  AST 56*  ALT 78*  ALKPHOS 107  BILITOT 1.1  PROT 5.6*  ALBUMIN 2.4*    No results for input(s): LIPASE, AMYLASE in the last 72 hours. Cardiac Enzymes No results for input(s): CKTOTAL, CKMB, CKMBINDEX, TROPONINI in the last 72 hours.  BNP: BNP (last 3 results) Recent Labs    10/10/21 1606 10/22/21 1623 10/24/21 2150  BNP 1,564.6* 1,226.7* 1,306.1*  ProBNP (last 3 results) No results for input(s): PROBNP in the last 8760 hours.   D-Dimer No results for input(s): DDIMER in the last 72 hours. Hemoglobin A1C No results for input(s): HGBA1C in the last 72 hours.  Fasting Lipid Panel No results for input(s): CHOL, HDL, LDLCALC, TRIG, CHOLHDL, LDLDIRECT in the last 72 hours. Thyroid Function Tests No results for input(s): TSH, T4TOTAL, T3FREE, THYROIDAB in the last 72 hours.  Invalid input(s): FREET3  Other results:   Imaging    No results found.   Medications:     Scheduled Medications:  amiodarone  200 mg Oral BID   aspirin  81 mg Oral Daily   atorvastatin  80 mg Oral Daily   bisacodyl  10 mg Oral Daily   Or   bisacodyl  10 mg Rectal Daily   Chlorhexidine Gluconate Cloth  6 each Topical Q0600   digoxin  0.125 mg Oral Daily   docusate  200 mg Oral BID   escitalopram  10 mg Oral  Daily   feeding supplement  237 mL Oral TID WC & HS   insulin aspart  0-24 Units Subcutaneous TID WC   insulin detemir  25 Units Subcutaneous Daily   latanoprost  1 drop Both Eyes QHS   lidocaine  2 patch Transdermal Q24H   mouth rinse  15 mL Mouth Rinse BID   midodrine  10 mg Oral TID WC   multivitamin  15 mL Oral Daily   pantoprazole  40 mg Oral BID AC   senna  2 tablet Oral QHS   sodium chloride flush  10-40 mL Intracatheter Q12H   sodium chloride flush  3 mL Intravenous Q12H   warfarin  1 mg Oral q1600   Warfarin - Physician Dosing Inpatient   Does not apply q1600    Infusions:  sodium chloride     lactated ringers     milrinone 0.125 mcg/kg/min (11/12/21 0600)    PRN Medications: morphine injection, ondansetron (ZOFRAN) IV, oxyCODONE, sodium chloride flush, sodium chloride flush, traMADol    Patient Profile   76 y.o. male with hx COPD, prediabetes, HLD, GERD. Recent admit earlier this month with later presentation inferior STEMI and new systolic CHF w/ moderate to severe ischemic MR.   Readmitted with a/c CHF. Optimized from HF standpoint. Underwent CABG w MVR, MAZE and LAA clip 5/26.   Assessment/Plan   1. CAD/recent late presentation inferior STEMI - LHC 05/04: 100% m RCA and 90% m LAD.  - RCA managed medically d/t completed infarct.  - Echo (5/23) EF 25%, RV moderately reduced, moderate to severe MR - 5/26: S/p CABG x 2 (LIMA-LAD, SVG-Ramus INT)   - ASA + high intensity statin  - No chest pain.   2. Acute on chronic systolic CHF with low-output/ICM - s/p late presentation inferior STEMI 05/23 - Echo 05/23 EF 25-30%, RV moderately reduced, moderate to severe MR. - Limited echo on admit EF 30-35%, RV moderately reduced, MR not well evaluated, moderate pericardial effusion - S/p CABG + bioprosthetic MVR 5/26 - Now off NE. On Milrinone 0.125.  CO-OX stable at 64% - Continue midodrine to 10 mg tid.  - CVP coming down. 9-10 . Continue IV lasix.  - Continue  digoxin 0.125, dig level 0.7.   - Eventually add back GDMT as blood pressure allows - Given age, fraility and reduced LV function suspect may be slow inotrope wean.   3. Mitral regurgitation - Moderate to severe ischemic MR on TEE.  - S/p  bioprosthetic MVR 11/02/21 - On warfarin per TCTS.    4. Aortic valve insufficiency - Mild to moderate AS/AI - stable.    5. Pericardial effusion/Acute Pericarditis  - Moderate in size on echo, no respiratory variation or RV collapse to suggest tamponade - acute pericarditis/ adhensions noted at time of CABG - CTs out   6. H/o AKI - Creatinine stable today at 1.16 - Watch renal function closely    7. Prediabetes - A1c 5.8 1/23 - holding [place] for now   8. PAF - s/p MAZE w/ CABG  - maintaining SR. continue amiodarone while on inotrope support then will stop.  - warfarin started, INR 2.7 today.  9. Hyponatremia  - Na 133 > 131 > 130 > 128 > 131 > 128 > 128 > 130 >127 given dose of tolvaptan>131 today. - Fluid restrict.  10. Post-op ileus/esophageal stricture - also has h/o esophageal stricture (was unable to pass TEE probe transgastric prior to CABG) - Barium swallow with distal stricture. GI has seen. Appreciate recs. Outpatient EGD with dilation  11. Post-op anemia - Hgb 7.8    Continue to diurese.    Length of Stay: [place], NP  11/12/2021, 7:09 AM  Advanced Heart Failure Team Pager [phone] (M-F; 7a - 5p)  Please contact [facility] for night-coverage after hours (5p -7a ) and weekends on [email or web address]  Patient seen with NP, agree with the above note.   Sodium up to 131 today.  Creatinine stable.  CVP around 10.  Good diuresis yesterday, weight still up a lot from baseline.  He remains on milrinone 0.125 with co-ox 64%.   General: NAD Neck: JVP 10 cm, no thyromegaly or thyroid nodule.  Lungs: Clear to auscultation bilaterally with normal respiratory effort. CV: Nondisplaced PMI.  Heart regular S1/S2, no S3/S4, no  murmur.  1+ edema to knees.   Abdomen: Soft, nontender, no hepatosplenomegaly, no distention.  Skin: Intact without lesions or rashes.  Neurologic: Alert and oriented x 3.  Psych: Normal affect. Extremities: No clubbing or cyanosis.  HEENT: Normal.   Diuresis at least 1 more day, will give Lasix 80 mg IV bid and metolazone 2.5 x 1.  Continue milrinone while diuresing.    BP stable on midodrine.   Continue to walk in hall.   Loralie Champagne 11/12/2021 7:38 AM

## 2021-11-13 ENCOUNTER — Encounter (HOSPITAL_COMMUNITY): Payer: Medicare HMO

## 2021-11-13 DIAGNOSIS — I5043 Acute on chronic combined systolic (congestive) and diastolic (congestive) heart failure: Secondary | ICD-10-CM | POA: Diagnosis not present

## 2021-11-13 LAB — CBC
HCT: 23.7 % — ABNORMAL LOW (ref 39.0–52.0)
Hemoglobin: 7.9 g/dL — ABNORMAL LOW (ref 13.0–17.0)
MCH: 28.3 pg (ref 26.0–34.0)
MCHC: 33.3 g/dL (ref 30.0–36.0)
MCV: 84.9 fL (ref 80.0–100.0)
Platelets: 314 10*3/uL (ref 150–400)
RBC: 2.79 MIL/uL — ABNORMAL LOW (ref 4.22–5.81)
RDW: 15 % (ref 11.5–15.5)
WBC: 14.4 10*3/uL — ABNORMAL HIGH (ref 4.0–10.5)
nRBC: 0.2 % (ref 0.0–0.2)

## 2021-11-13 LAB — GLUCOSE, CAPILLARY
Glucose-Capillary: 92 mg/dL (ref 70–99)
Glucose-Capillary: 128 mg/dL — ABNORMAL HIGH (ref 70–99)
Glucose-Capillary: 138 mg/dL — ABNORMAL HIGH (ref 70–99)
Glucose-Capillary: 95 mg/dL (ref 70–99)

## 2021-11-13 LAB — COOXEMETRY PANEL
Carboxyhemoglobin: 2.2 % — ABNORMAL HIGH (ref 0.5–1.5)
Methemoglobin: <0.7 % (ref 0.0–1.5)
O2 Saturation: 63.9 %
Total hemoglobin: 7.4 g/dL — ABNORMAL LOW (ref 12.0–16.0)

## 2021-11-13 LAB — PROTIME-INR
INR: 2.6 — ABNORMAL HIGH (ref 0.8–1.2)
Prothrombin Time: 27.7 seconds — ABNORMAL HIGH (ref 11.4–15.2)

## 2021-11-13 LAB — BASIC METABOLIC PANEL
Anion gap: 11 (ref 5–15)
BUN: 40 mg/dL — ABNORMAL HIGH (ref 8–23)
CO2: 29 mmol/L (ref 22–32)
Calcium: 7.9 mg/dL — ABNORMAL LOW (ref 8.9–10.3)
Chloride: 90 mmol/L — ABNORMAL LOW (ref 98–111)
Creatinine, Ser: 1.14 mg/dL (ref 0.61–1.24)
GFR, Estimated: >60 mL/min (ref >60)
Glucose, Bld: 102 mg/dL — ABNORMAL HIGH (ref 70–99)
Potassium: 3.5 mmol/L (ref 3.5–5.1)
Sodium: 130 mmol/L — ABNORMAL LOW (ref 135–145)

## 2021-11-13 LAB — MAGNESIUM: Magnesium: 2.1 mg/dL (ref 1.7–2.4)

## 2021-11-13 MED ORDER — TORSEMIDE 20 MG PO TABS
40.0000 mg | ORAL_TABLET | Freq: Every day | ORAL | Status: DC
  Administered 2021-11-13: 40 mg via ORAL
  Filled 2021-11-13: qty 2

## 2021-11-13 MED ORDER — POTASSIUM CHLORIDE CRYS ER 20 MEQ PO TBCR
20.0000 meq | EXTENDED_RELEASE_TABLET | ORAL | Status: AC
  Administered 2021-11-13 (×3): 20 meq via ORAL
  Filled 2021-11-13 (×3): qty 1

## 2021-11-13 MED ORDER — CHLORHEXIDINE GLUCONATE CLOTH 2 % EX PADS
6.0000 | MEDICATED_PAD | Freq: Every day | CUTANEOUS | Status: DC
  Administered 2021-11-13 – 2021-11-18 (×6): 6 via TOPICAL

## 2021-11-13 MED ORDER — POTASSIUM CHLORIDE CRYS ER 20 MEQ PO TBCR: 20.0000 meq | EXTENDED_RELEASE_TABLET | Freq: Two times a day (BID) | ORAL | Status: DC

## 2021-11-13 NOTE — Progress Notes (Signed)
Physical Therapy Treatment Patient Details Name: Keith Jenkins MRN: [id number] DOB: 02/16/1946 Today's Date: 11/13/2021   History of Present Illness Pt is a 76 y.o. male admitted 10/24/21 witih SOB and chest pain after recent STEMI. Workup for CHF, aflutter. S/p CABG, MVR 5/26. PMH includes pre-DM, HLD, arthritis, glaucoma.   PT Comments    Pt progressing with mobility; able to perform bed mobility, transfers and ambulation with rollator at supervision-level. Pt continues to demonstrate improving strength and activity tolerance despite c/o fatigue. Will plan for stair training next session.    Recommendations for follow up therapy are one component of a multi-disciplinary discharge planning process, led by the attending physician.  Recommendations may be updated based on patient status, additional functional criteria and insurance authorization.  Follow Up Recommendations  Home health PT     Assistance Recommended at Discharge Intermittent Supervision/Assistance  Patient can return home with the following A little help with bathing/dressing/bathroom;Assistance with cooking/housework;Assist for transportation;Help with stairs or ramp for entrance   Equipment Recommendations  Rollator (4 wheels)    Recommendations for Other Services       Precautions / Restrictions Precautions Precautions: Fall;Sternal     Mobility  Bed Mobility Overal bed mobility: Independent Bed Mobility: Rolling, Sidelying to Sit           General bed mobility comments: cues for log roll technique, pt prefers to power up to sitting from flat bed, though able to do so indep while maintaining sternal precautions    Transfers Overall transfer level: Modified independent Equipment used: Rollator (4 wheels) Transfers: Sit to/from Stand             General transfer comment: reliant on momentum to power into standing, good carryover of locking rollator brakes    Ambulation/Gait Ambulation/Gait  assistance: Supervision Gait Distance (Feet): 400 Feet Assistive device: Rollator (4 wheels) Gait Pattern/deviations: Step-through pattern, Decreased stride length, Trunk flexed Gait velocity: Decreased Gait velocity interpretation: <1.31 ft/sec, indicative of household ambulator   General Gait Details: slow, fatigued gait with rollator and supervision; intermittent standing rest breaks; pt endorses fatigue, no SOB noted   Stairs             Wheelchair Mobility    Modified Rankin (Stroke Patients Only)       Balance Overall balance assessment: Needs assistance Sitting-balance support: No upper extremity supported, Feet supported Sitting balance-Leahy Scale: Good     Standing balance support: During functional activity Standing balance-Leahy Scale: Fair                              Cognition Arousal/Alertness: Awake/alert Behavior During Therapy: WFL for tasks assessed/performed, Flat affect Overall Cognitive Status: Within Functional Limits for tasks assessed                                 General Comments: WFL for simple tasks; prefers increased time to complete tasks        Exercises Other Exercises Other Exercises: decreased motivation to perform mobiltiy beyond ambulation, endorses fatigue post-walk Other Exercises: Incentive spirometer x5 - pt pulling ~500-750 mL, min cues for technique. educ on importance of increased freq, to which pt reports he does not want to do it 10x/hr    General Comments        Pertinent Vitals/Pain Pain Assessment Pain Assessment: 0-10 Faces Pain Scale: Hurts a little bit Pain  Location: generalized Pain Descriptors / Indicators: Tiring Pain Intervention(s): Monitored during session    Home Living                          Prior Function            PT Goals (current goals can now be found in the care plan section) Progress towards PT goals: Progressing toward goals     Frequency    Min 3X/week      PT Plan Current plan remains appropriate    Co-evaluation              AM-PAC PT "6 Clicks" Mobility   Outcome Measure  Help needed turning from your back to your side while in a flat bed without using bedrails?: None Help needed moving from lying on your back to sitting on the side of a flat bed without using bedrails?: None Help needed moving to and from a bed to a chair (including a wheelchair)?: A Little Help needed standing up from a chair using your arms (e.g., wheelchair or bedside chair)?: A Little Help needed to walk in hospital room?: A Little Help needed climbing 3-5 steps with a railing? : A Little 6 Click Score: 20    End of Session Equipment Utilized During Treatment: Gait belt Activity Tolerance: Patient tolerated treatment well Patient left: in bed;with call bell/phone within reach Nurse Communication: Mobility status PT Visit Diagnosis: Other abnormalities of gait and mobility (R26.89);Muscle weakness (generalized) (M62.81);Unsteadiness on feet (R26.81);Difficulty in walking, not elsewhere classified (R26.2)     Time: 2952-8413 PT Time Calculation (min) (ACUTE ONLY): 30 min  Charges:  $Therapeutic Exercise: 8-22 mins $Therapeutic Activity: 8-22 mins                     Ina Homes, PT, DPT Acute Rehabilitation Services  Pager [phone] Office [phone]  Malachy Chamber 11/13/2021, 5:59 PM

## 2021-11-13 NOTE — Progress Notes (Signed)
12 Days Post-Op Procedure(s) (LRB): MITRAL VALVE (MV) REPLACEMENT USING MITRIS VALVE. (N/A) CORONARY ARTERY BYPASS GRAFTING (CABG) X TWO USING OPEN LEFT INTERNAL MAMMARY ARTERY AND ENDOSOPIC RIGHT GREATER SAPHENOUS VEIN HARVEST. (N/A) MAZE (N/A) TRANSESOPHAGEAL ECHOCARDIOGRAM (TEE) (N/A) CLIPPING OF ATRIAL APPENDAGE USING AN ATRICLIP PRO2 (N/A) Subjective: No complaints. Ambulated two laps back to back this am and felt good.  Co-ox 64 on milrinone 0.125  -3300 cc yesterday and wt down 4-5 lbs. Still about 9 lbs over preop.  Objective: Vital signs in last 24 hours: Temp:  [97.8 F (36.6 C)-98.4 F (36.9 C)] 97.8 F (36.6 C) (06/07 0410) Pulse Rate:  [66-78] 66 (06/07 0740) Cardiac Rhythm: Normal sinus rhythm (06/07 0740) Resp:  [20] 20 (06/07 0740) BP: (98-119)/(59-84) 100/84 (06/07 0740) SpO2:  [91 %-98 %] 97 % (06/07 0740) Weight:  [97.4 kg] 97.4 kg (06/07 0552)  Hemodynamic parameters for last 24 hours: CVP:  [2 mmHg-11 mmHg] 8 mmHg  Intake/Output from previous day: 06/06 0701 - 06/07 0700 In: 857.5 [P.O.:840; I.V.:17.5] Out: 4200 [Urine:4200] Intake/Output this shift: Total I/O In: 70.2 [I.V.:70.2] Out: -   General appearance: alert and cooperative Neurologic: intact Heart: regular rate and rhythm, S1, S2 normal, no murmur Lungs: clear to auscultation bilaterally Extremities: edema mild Wound: incision healing well  Lab Results: Recent Labs    11/12/21 0516 11/13/21 0417  WBC 13.4* 14.4*  HGB 7.8* 7.9*  HCT 23.2* 23.7*  PLT 286 314   BMET:  Recent Labs    11/12/21 0510 11/13/21 0417  NA 131* 130*  K 4.1 3.5  CL 94* 90*  CO2 30 29  GLUCOSE 127* 102*  BUN 41* 40*  CREATININE 1.16 1.14  CALCIUM 7.9* 7.9*    PT/INR:  Recent Labs    11/13/21 0417  LABPROT 27.7*  INR 2.6*   ABG    Component Value Date/Time   PHART 7.300 (L) 11/02/2021 0151   HCO3 23.7 11/02/2021 0151   TCO2 25 11/02/2021 0151   ACIDBASEDEF 3.0 (H) 11/02/2021  0151   O2SAT 63.9 11/13/2021 0417   CBG (last 3)  Recent Labs    11/12/21 1628 11/12/21 2106 11/13/21 0655  GLUCAP 100* 112* 92    Assessment/Plan: S/P Procedure(s) (LRB): MITRAL VALVE (MV) REPLACEMENT USING MITRIS VALVE. (N/A) CORONARY ARTERY BYPASS GRAFTING (CABG) X TWO USING OPEN LEFT INTERNAL MAMMARY ARTERY AND ENDOSOPIC RIGHT GREATER SAPHENOUS VEIN HARVEST. (N/A) MAZE (N/A) TRANSESOPHAGEAL ECHOCARDIOGRAM (TEE) (N/A) CLIPPING OF ATRIAL APPENDAGE USING AN ATRICLIP PRO2 (N/A)  POD 12 Hemodynamics stable on midodrine. DC milrinone. Follow up Co-ox tomorrow.  Continue diuresis per AHF team.  Coumadin 1 mg daily for now.  Transfer to [facility] and continue ambulation, IS.   LOS: 20 days    Keith Jenkins 11/13/2021

## 2021-11-13 NOTE — Progress Notes (Signed)
Patient ID: Keith Jenkins, male   DOB: May 31, 1946, 76 y.o.   MRN: [id number]     Advanced Heart Failure Rounding Note  PCP-Cardiologist: None   Subjective:    05/19: Admit with a/c CHF. CO-OX okay but appeared low-output >>started milrinone 0.25 05/22: Developed A flutter. Started on amio drip.  05/26: S/p CABG x2 (LIMA-LAD, SVG-RAMUS INT) +MVR, MAZE, LAA Clip  6/1: Barium swallow with distal esophageal stricture. GI consult -> ok to continue po (outpatient f/u for EGD/dilation) 6/4: Midodrine increased to 10 mg tid. Diuresed with IV lasix. Norepi stopped.  6/5: Given IV lasix + tolvaptan.   Good diuresis again, weight down 5 lbs.  CVP 7.  Creatinine stable 1.14.  Remains on milrinone 0.125 mcg. CO-OX 64%.    Walking in hall.   Objective:   Weight Range: 97.4 kg Body mass index is 28.33 kg/m.   Vital Signs:   Temp:  [97.6 F (36.4 C)-98.4 F (36.9 C)] 97.8 F (36.6 C) (06/07 0410) Pulse Rate:  [66-78] 66 (06/07 0740) BP: (95-119)/(59-84) 100/84 (06/07 0740) SpO2:  [91 %-98 %] 97 % (06/07 0740) Weight:  [97.4 kg] 97.4 kg (06/07 0552) Last BM Date : 11/12/21  Weight change: Filed Weights   11/11/21 0700 11/12/21 0500 11/13/21 0552  Weight: 99.7 kg 99.5 kg 97.4 kg    Intake/Output:   Intake/Output Summary (Last 24 hours) at 11/13/2021 0742 Last data filed at 11/13/2021 0710 Gross per 24 hour  Intake 927.7 ml  Output 4200 ml  Net -3272.3 ml    CVP 7  Physical Exam   General: NAD Neck: No JVD, no thyromegaly or thyroid nodule.  Lungs: Clear to auscultation bilaterally with normal respiratory effort. CV: Nondisplaced PMI.  Heart regular S1/S2, no S3/S4, no murmur.  1+ edema to knees.  Abdomen: Soft, nontender, no hepatosplenomegaly, no distention.  Skin: Intact without lesions or rashes.  Neurologic: Alert and oriented x 3.  Psych: Normal affect. Extremities: No clubbing or cyanosis.  HEENT: Normal.   Telemetry   Sr 70-80s   Labs    CBC Recent Labs     11/12/21 0516 11/13/21 0417  WBC 13.4* 14.4*  HGB 7.8* 7.9*  HCT 23.2* 23.7*  MCV 84.4 84.9  PLT 286 [id number]   Basic Metabolic Panel Recent Labs    11/12/21 0510 11/13/21 0417  NA 131* 130*  K 4.1 3.5  CL 94* 90*  CO2 30 29  GLUCOSE 127* 102*  BUN 41* 40*  CREATININE 1.16 1.14  CALCIUM 7.9* 7.9*  MG 2.1 2.1   Liver Function Tests Recent Labs    11/11/21 0808  AST 56*  ALT 78*  ALKPHOS 107  BILITOT 1.1  PROT 5.6*  ALBUMIN 2.4*    No results for input(s): LIPASE, AMYLASE in the last 72 hours. Cardiac Enzymes No results for input(s): CKTOTAL, CKMB, CKMBINDEX, TROPONINI in the last 72 hours.  BNP: BNP (last 3 results) Recent Labs    10/10/21 1606 10/22/21 1623 10/24/21 2150  BNP 1,564.6* 1,226.7* 1,306.1*    ProBNP (last 3 results) No results for input(s): PROBNP in the last 8760 hours.   D-Dimer No results for input(s): DDIMER in the last 72 hours. Hemoglobin A1C No results for input(s): HGBA1C in the last 72 hours.  Fasting Lipid Panel No results for input(s): CHOL, HDL, LDLCALC, TRIG, CHOLHDL, LDLDIRECT in the last 72 hours. Thyroid Function Tests No results for input(s): TSH, T4TOTAL, T3FREE, THYROIDAB in the last 72 hours.  Invalid input(s): FREET3  Other  results:   Imaging    No results found.   Medications:     Scheduled Medications:  amiodarone  200 mg Oral BID   aspirin  81 mg Oral Daily   atorvastatin  80 mg Oral Daily   bisacodyl  10 mg Oral Daily   Or   bisacodyl  10 mg Rectal Daily   Chlorhexidine Gluconate Cloth  6 each Topical Q0600   digoxin  0.125 mg Oral Daily   docusate  200 mg Oral BID   escitalopram  10 mg Oral Daily   feeding supplement  237 mL Oral TID WC & HS   insulin aspart  0-24 Units Subcutaneous TID WC   insulin detemir  25 Units Subcutaneous Daily   latanoprost  1 drop Both Eyes QHS   lidocaine  2 patch Transdermal Q24H   mouth rinse  15 mL Mouth Rinse BID   midodrine  10 mg Oral TID WC    multivitamin  15 mL Oral Daily   pantoprazole  40 mg Oral BID AC   potassium chloride  20 mEq Oral Q4H   senna  2 tablet Oral QHS   sodium chloride flush  10-40 mL Intracatheter Q12H   sodium chloride flush  3 mL Intravenous Q12H   warfarin  1 mg Oral q1600   Warfarin - Physician Dosing Inpatient   Does not apply q1600    Infusions:  sodium chloride     lactated ringers     milrinone 0.125 mcg/kg/min (11/13/21 0710)    PRN Medications: morphine injection, ondansetron (ZOFRAN) IV, oxyCODONE, sodium chloride flush, sodium chloride flush, traMADol    Patient Profile   76 y.o. male with hx COPD, prediabetes, HLD, GERD. Recent admit earlier this month with later presentation inferior STEMI and new systolic CHF w/ moderate to severe ischemic MR.   Readmitted with a/c CHF. Optimized from HF standpoint. Underwent CABG w MVR, MAZE and LAA clip 5/26.   Assessment/Plan   1. CAD/recent late presentation inferior STEMI - LHC 05/04: 100% m RCA and 90% m LAD.  - RCA managed medically d/t completed infarct.  - Echo (5/23) EF 25%, RV moderately reduced, moderate to severe MR - 5/26: S/p CABG x 2 (LIMA-LAD, SVG-Ramus INT)   - ASA + high intensity statin  - No chest pain.   2. Acute on chronic systolic CHF with low-output/ICM - s/p late presentation inferior STEMI 05/23 - Echo 05/23 EF 25-30%, RV moderately reduced, moderate to severe MR. - Limited echo on admit EF 30-35%, RV moderately reduced, MR not well evaluated, moderate pericardial effusion - S/p CABG + bioprosthetic MVR 5/26 - Now off NE.  - Co-ox 64%, stop milrinone today.  - Continue midodrine to 10 mg tid.  - CVP down to 7, weight down 5 lbs. Stop IV lasix and start torsemide 40 mg daily.  - Continue digoxin 0.125, dig level 0.7.   - Eventually add back GDMT as blood pressure allows - Given age, fraility and reduced LV function suspect may be slow inotrope wean.   3. Mitral regurgitation - Moderate to severe ischemic MR on  TEE.  - S/p bioprosthetic MVR 11/02/21 - On warfarin per TCTS.    4. Aortic valve insufficiency - Mild to moderate AS/AI - stable.    5. Pericardial effusion/Acute Pericarditis  - Moderate in size on echo, no respiratory variation or RV collapse to suggest tamponade - acute pericarditis/ adhensions noted at time of CABG - CTs out   6. H/o AKI -  Creatinine stable today at 1.14 - Watch renal function closely    7. Prediabetes - A1c 5.8 1/23 - holding [place] for now   8. PAF - s/p MAZE w/ CABG  - maintaining SR. continue amiodarone while on inotrope support then will stop.  - warfarin started, INR 2.7 today.  9. Hyponatremia  - Na 130, stable. - Fluid restrict.  10. Post-op ileus/esophageal stricture - also has h/o esophageal stricture (was unable to pass TEE probe transgastric prior to CABG) - Barium swallow with distal stricture. GI has seen. Appreciate recs. Outpatient EGD with dilation  11. Post-op anemia - Hgb 7.9, stable.  Transfuse < 7.5.    Ready for step down.  Length of Stay: [place], MD  11/13/2021, 7:42 AM  Advanced Heart Failure Team Pager [phone] (M-F; 7a - 5p)  Please contact [facility] for night-coverage after hours (5p -7a ) and weekends on [email or web address]

## 2021-11-14 DIAGNOSIS — I5043 Acute on chronic combined systolic (congestive) and diastolic (congestive) heart failure: Secondary | ICD-10-CM | POA: Diagnosis not present

## 2021-11-14 LAB — CBC
HCT: 23.9 % — ABNORMAL LOW (ref 39.0–52.0)
Hemoglobin: 7.8 g/dL — ABNORMAL LOW (ref 13.0–17.0)
MCH: 28.1 pg (ref 26.0–34.0)
MCHC: 32.6 g/dL (ref 30.0–36.0)
MCV: 86 fL (ref 80.0–100.0)
Platelets: 306 10*3/uL (ref 150–400)
RBC: 2.78 MIL/uL — ABNORMAL LOW (ref 4.22–5.81)
RDW: 15.2 % (ref 11.5–15.5)
WBC: 13.5 10*3/uL — ABNORMAL HIGH (ref 4.0–10.5)
nRBC: 0.2 % (ref 0.0–0.2)

## 2021-11-14 LAB — BASIC METABOLIC PANEL
Anion gap: 11 (ref 5–15)
BUN: 47 mg/dL — ABNORMAL HIGH (ref 8–23)
CO2: 30 mmol/L (ref 22–32)
Calcium: 8 mg/dL — ABNORMAL LOW (ref 8.9–10.3)
Chloride: 90 mmol/L — ABNORMAL LOW (ref 98–111)
Creatinine, Ser: 1.33 mg/dL — ABNORMAL HIGH (ref 0.61–1.24)
GFR, Estimated: 56 mL/min — ABNORMAL LOW (ref >60)
Glucose, Bld: 126 mg/dL — ABNORMAL HIGH (ref 70–99)
Potassium: 3.2 mmol/L — ABNORMAL LOW (ref 3.5–5.1)
Sodium: 131 mmol/L — ABNORMAL LOW (ref 135–145)

## 2021-11-14 LAB — GLUCOSE, CAPILLARY
Glucose-Capillary: 77 mg/dL (ref 70–99)
Glucose-Capillary: 122 mg/dL — ABNORMAL HIGH (ref 70–99)
Glucose-Capillary: 117 mg/dL — ABNORMAL HIGH (ref 70–99)
Glucose-Capillary: 73 mg/dL (ref 70–99)

## 2021-11-14 LAB — PROTIME-INR
INR: 2.5 — ABNORMAL HIGH (ref 0.8–1.2)
Prothrombin Time: 26.6 seconds — ABNORMAL HIGH (ref 11.4–15.2)

## 2021-11-14 LAB — COOXEMETRY PANEL
Carboxyhemoglobin: 2 % — ABNORMAL HIGH (ref 0.5–1.5)
Methemoglobin: 0.8 % (ref 0.0–1.5)
O2 Saturation: 61.4 %
Total hemoglobin: 8.2 g/dL — ABNORMAL LOW (ref 12.0–16.0)

## 2021-11-14 MED ORDER — FUROSEMIDE 10 MG/ML IJ SOLN
80.0000 mg | Freq: Two times a day (BID) | INTRAMUSCULAR | Status: AC
Start: 2021-11-14 — End: 2021-11-14
  Administered 2021-11-14 (×2): 80 mg via INTRAVENOUS
  Filled 2021-11-14 (×2): qty 8

## 2021-11-14 MED ORDER — FUROSEMIDE 10 MG/ML IJ SOLN: 80.0000 mg | Freq: Once | INTRAMUSCULAR | Status: DC

## 2021-11-14 MED ORDER — POTASSIUM CHLORIDE CRYS ER 20 MEQ PO TBCR
40.0000 meq | EXTENDED_RELEASE_TABLET | Freq: Once | ORAL | Status: AC
  Administered 2021-11-14: 40 meq via ORAL
  Filled 2021-11-14: qty 2

## 2021-11-14 MED ORDER — POTASSIUM CHLORIDE CRYS ER 20 MEQ PO TBCR
40.0000 meq | EXTENDED_RELEASE_TABLET | Freq: Two times a day (BID) | ORAL | Status: AC
Start: 2021-11-14 — End: 2021-11-14
  Administered 2021-11-14 (×2): 40 meq via ORAL
  Filled 2021-11-14 (×2): qty 2

## 2021-11-14 NOTE — Progress Notes (Signed)
CARDIAC REHAB PHASE I   PRE:  Rate/Rhythm: 71 Sr    BP: sitting 87/56    SaO2: 95 RA  MODE:  Ambulation: 270 ft   POST:  Rate/Rhythm: 81 SR    BP: sitting 115/68     SaO2: 95 RA  Pt c/o lack of sleep but willing to ambulate. Moved to EOB independently and stood independently with slight use of arms close to his side. Walked with rollator, slow and steady, no assist. C/o arm fatigue with distance. To recliner, VSS. Elevated legs, encouraged IS today.  [id number]   Ethelda Chick CES, ACSM 11/14/2021 9:12 AM

## 2021-11-14 NOTE — Progress Notes (Addendum)
Patient ID: Keith Jenkins, male   DOB: Jun 21, 1945, 76 y.o.   MRN: [id number]     Advanced Heart Failure Rounding Note  PCP-Cardiologist: None   Subjective:    05/19: Admit with a/c CHF. CO-OX okay but appeared low-output >>started milrinone 0.25 05/22: Developed A flutter. Started on amio drip.  05/26: S/p CABG x2 (LIMA-LAD, SVG-RAMUS INT) +MVR, MAZE, LAA Clip  6/1: Barium swallow with distal esophageal stricture. GI consult -> ok to continue po (outpatient f/u for EGD/dilation) 6/4: Midodrine increased to 10 mg tid. Diuresed with IV lasix. Norepi stopped.  6/5: Given IV lasix + tolvaptan.  6/7: Milrinone stopped. Transitioned to PO diuretics.   Co-ox 61% off Milrinone.   3L in UOP w/ torsemide. Wt down 2 lb. Still 7 lb above pre-op wt. CVP 15   BMP pending    Objective:   Weight Range: 96.5 kg Body mass index is 28.07 kg/m.   Vital Signs:   Temp:  [97.6 F (36.4 C)-97.9 F (36.6 C)] 97.8 F (36.6 C) (06/08 0736) Pulse Rate:  [66-69] 69 (06/08 0316) Resp:  [17-20] 20 (06/08 0736) BP: (93-104)/(55-84) 93/55 (06/08 0316) SpO2:  [95 %-97 %] 95 % (06/08 0316) Weight:  [96.5 kg] 96.5 kg (06/08 0500) Last BM Date : 11/12/21  Weight change: Filed Weights   11/12/21 0500 11/13/21 0552 11/14/21 0500  Weight: 99.5 kg 97.4 kg 96.5 kg    Intake/Output:   Intake/Output Summary (Last 24 hours) at 11/14/2021 0738 Last data filed at 11/14/2021 0318 Gross per 24 hour  Intake --  Output 3175 ml  Net -3175 ml    CVP 7  Physical Exam   CVP 15  General:  Well appearing elderly male. No respiratory difficulty HEENT: normal Neck: supple. JVD to jaw. Carotids 2+ bilat; no bruits. No lymphadenopathy or thyromegaly appreciated. Cor: PMI nondisplaced. Regular rate & rhythm. No rubs, gallops or murmurs. Lungs: clear Abdomen: soft, nontender, nondistended. No hepatosplenomegaly. No bruits or masses. Good bowel sounds. Extremities: no cyanosis, clubbing, rash, 1-2+ b/l LE edema up to  knees, + TED hoses  Neuro: alert & oriented x 3, cranial nerves grossly intact. moves all 4 extremities w/o difficulty. Affect pleasant.   Telemetry   NSR 80s   Labs    CBC Recent Labs    11/13/21 0417 11/14/21 0418  WBC 14.4* 13.5*  HGB 7.9* 7.8*  HCT 23.7* 23.9*  MCV 84.9 86.0  PLT 314 [id number]   Basic Metabolic Panel Recent Labs    11/12/21 0510 11/13/21 0417  NA 131* 130*  K 4.1 3.5  CL 94* 90*  CO2 30 29  GLUCOSE 127* 102*  BUN 41* 40*  CREATININE 1.16 1.14  CALCIUM 7.9* 7.9*  MG 2.1 2.1   Liver Function Tests Recent Labs    11/11/21 0808  AST 56*  ALT 78*  ALKPHOS 107  BILITOT 1.1  PROT 5.6*  ALBUMIN 2.4*    No results for input(s): "LIPASE", "AMYLASE" in the last 72 hours. Cardiac Enzymes No results for input(s): "CKTOTAL", "CKMB", "CKMBINDEX", "TROPONINI" in the last 72 hours.  BNP: BNP (last 3 results) Recent Labs    10/10/21 1606 10/22/21 1623 10/24/21 2150  BNP 1,564.6* 1,226.7* 1,306.1*    ProBNP (last 3 results) No results for input(s): "PROBNP" in the last 8760 hours.   D-Dimer No results for input(s): "DDIMER" in the last 72 hours. Hemoglobin A1C No results for input(s): "HGBA1C" in the last 72 hours.  Fasting Lipid Panel No results for input(s): "  CHOL", "HDL", "LDLCALC", "TRIG", "CHOLHDL", "LDLDIRECT" in the last 72 hours. Thyroid Function Tests No results for input(s): "TSH", "T4TOTAL", "T3FREE", "THYROIDAB" in the last 72 hours.  Invalid input(s): "FREET3"  Other results:   Imaging    No results found.   Medications:     Scheduled Medications:  amiodarone  200 mg Oral BID   aspirin  81 mg Oral Daily   atorvastatin  80 mg Oral Daily   bisacodyl  10 mg Oral Daily   Or   bisacodyl  10 mg Rectal Daily   Chlorhexidine Gluconate Cloth  6 each Topical Daily   digoxin  0.125 mg Oral Daily   docusate  200 mg Oral BID   escitalopram  10 mg Oral Daily   feeding supplement  237 mL Oral TID WC & HS   insulin aspart   0-24 Units Subcutaneous TID WC   insulin detemir  25 Units Subcutaneous Daily   latanoprost  1 drop Both Eyes QHS   lidocaine  2 patch Transdermal Q24H   mouth rinse  15 mL Mouth Rinse BID   midodrine  10 mg Oral TID WC   multivitamin  15 mL Oral Daily   pantoprazole  40 mg Oral BID AC   potassium chloride  20 mEq Oral BID   senna  2 tablet Oral QHS   sodium chloride flush  10-40 mL Intracatheter Q12H   sodium chloride flush  3 mL Intravenous Q12H   torsemide  40 mg Oral Daily   warfarin  1 mg Oral q1600   Warfarin - Physician Dosing Inpatient   Does not apply q1600    Infusions:  sodium chloride     lactated ringers      PRN Medications: ondansetron (ZOFRAN) IV, oxyCODONE, sodium chloride flush, sodium chloride flush, traMADol    Patient Profile   76 y.o. male with hx COPD, prediabetes, HLD, GERD. Recent admit earlier this month with later presentation inferior STEMI and new systolic CHF w/ moderate to severe ischemic MR.   Readmitted with a/c CHF. Optimized from HF standpoint. Underwent CABG w MVR, MAZE and LAA clip 5/26.   Assessment/Plan   1. CAD/recent late presentation inferior STEMI - LHC 05/04: 100% m RCA and 90% m LAD.  - RCA managed medically d/t completed infarct.  - Echo (5/23) EF 25%, RV moderately reduced, moderate to severe MR - 5/26: S/p CABG x 2 (LIMA-LAD, SVG-[place] INT)   - ASA + high intensity statin  - No chest pain.   2. Acute on chronic systolic CHF with low-output/ICM - s/p late presentation inferior STEMI 05/23 - Echo 05/23 EF 25-30%, RV moderately reduced, moderate to severe MR. - Limited echo on admit EF 30-35%, RV moderately reduced, MR not well evaluated, moderate pericardial effusion - S/p CABG + bioprosthetic MVR 5/26 - Now off NE and Milrinone  - Co-ox 61% today.  - Fluid overloaded. CVP 15. Still 7 lb above pre-op wt. Give IV Lasix 80 mg x 1  - Continue midodrine to 10 mg tid. .  - Continue digoxin 0.125, dig level 0.7.   -  Eventually add back GDMT as blood pressure allows   3. Mitral regurgitation - Moderate to severe ischemic MR on TEE.  - S/p bioprosthetic MVR 11/02/21 - On warfarin per TCTS.    4. Aortic valve insufficiency - Mild to moderate AS/AI - stable.    5. Pericardial effusion/Acute Pericarditis  - Moderate in size on echo, no respiratory variation or RV collapse to  suggest tamponade - acute pericarditis/ adhensions noted at time of CABG - CTs out   6. H/o AKI - BMP pending  - Watch renal function closely    7. Prediabetes - A1c 5.8 1/23 - holding [place] for now   8. PAF - s/p MAZE w/ CABG  - maintaining SR. continue amiodarone while on inotrope support then will stop.  - warfarin started, INR 2.5 today.  9. Hyponatremia  - Na 130>pending  - Fluid restrict. - c/w diuresis   10. Post-op ileus/esophageal stricture - also has h/o esophageal stricture (was unable to pass TEE probe transgastric prior to CABG) - Barium swallow with distal stricture. GI has seen. Appreciate recs. Outpatient EGD with dilation  11. Post-op anemia - Hgb 7.8, stable.  Transfuse < 7.5.    Continue to mobilize. PT recommending [facility].   Length of Stay: [address], PA-C  11/14/2021, 7:38 AM  Advanced Heart Failure Team Pager [phone] (M-F; 7a - 5p)  Please contact [facility] for night-coverage after hours (5p -7a ) and weekends on [email or web address]  Patient seen with PA, agree with the above note.   Good diuresis yesterday, but CVP still around 13-14 today.  Still above baseline weight.  Co-ox 61% off milrinone.   No complaints.   General: NAD Neck: JVP 12 cm, no thyromegaly or thyroid nodule.  Lungs: Clear to auscultation bilaterally with normal respiratory effort. CV: Nondisplaced PMI.  Heart regular S1/S2, no S3/S4, no murmur.  No peripheral edema.   Abdomen: Soft, nontender, no hepatosplenomegaly, no distention.  Skin: Intact without lesions or rashes.  Neurologic: Alert and  oriented x 3.  Psych: Normal affect. Extremities: No clubbing or cyanosis.  HEENT: Normal.   Continue midodrine, SBP around 90.  Co-ox adequate off milrinone, continue digoxin.   Still needs some diuresis, will put back on Lasix 80 mg IV bid for today, reassess tomorrow.   Continue to mobilize.   Loralie Champagne 11/14/2021 8:12 AM

## 2021-11-14 NOTE — Progress Notes (Signed)
      [address]       [place] [address]             [phone]      13 Days Post-Op Procedure(s) (LRB): MITRAL VALVE (MV) REPLACEMENT USING MITRIS VALVE. (N/A) CORONARY ARTERY BYPASS GRAFTING (CABG) X TWO USING OPEN LEFT INTERNAL MAMMARY ARTERY AND ENDOSOPIC RIGHT GREATER SAPHENOUS VEIN HARVEST. (N/A) MAZE (N/A) TRANSESOPHAGEAL ECHOCARDIOGRAM (TEE) (N/A) CLIPPING OF ATRIAL APPENDAGE USING AN ATRICLIP PRO2 (N/A)  Subjective:  Patient is just trying to rest.  He has no specific complaints, states he is getting more diuretic today.  Objective: Vital signs in last 24 hours: Temp:  [97.6 F (36.4 C)-97.9 F (36.6 C)] 97.8 F (36.6 C) (06/08 0736) Pulse Rate:  [67-69] 69 (06/08 0316) Cardiac Rhythm: Normal sinus rhythm (06/08 0700) Resp:  [17-20] 20 (06/08 0736) BP: (93-104)/(55-64) 93/55 (06/08 0316) SpO2:  [95 %-97 %] 95 % (06/08 0316) Weight:  [96.5 kg] 96.5 kg (06/08 0500)  Hemodynamic parameters for last 24 hours: CVP:  [5 mmHg-15 mmHg] 15 mmHg  Intake/Output from previous day: 06/07 0701 - 06/08 0700 In: 270.2 [P.O.:200; I.V.:70.2] Out: 3175 [Urine:3175]  General appearance: alert, cooperative, and no distress Heart: regular rate and rhythm Lungs: clear to auscultation bilaterally Abdomen: soft, non-tender; bowel sounds normal; no masses,  no organomegaly Extremities: edema +1 Wound: clean and dry  Lab Results: Recent Labs    11/13/21 0417 11/14/21 0418  WBC 14.4* 13.5*  HGB 7.9* 7.8*  HCT 23.7* 23.9*  PLT 314 306   BMET:  Recent Labs    11/12/21 0510 11/13/21 0417  NA 131* 130*  K 4.1 3.5  CL 94* 90*  CO2 30 29  GLUCOSE 127* 102*  BUN 41* 40*  CREATININE 1.16 1.14  CALCIUM 7.9* 7.9*    PT/INR:  Recent Labs    11/14/21 0418  LABPROT 26.6*  INR 2.5*   ABG    Component Value Date/Time   PHART 7.300 (L) 11/02/2021 0151   HCO3 23.7 11/02/2021 0151   TCO2 25 11/02/2021 0151   ACIDBASEDEF 3.0 (H) 11/02/2021  0151   O2SAT 61.4 11/14/2021 0418   CBG (last 3)  Recent Labs    11/13/21 1630 11/13/21 2126 11/14/21 0627  GLUCAP 138* 95 77    Assessment/Plan: S/P Procedure(s) (LRB): MITRAL VALVE (MV) REPLACEMENT USING MITRIS VALVE. (N/A) CORONARY ARTERY BYPASS GRAFTING (CABG) X TWO USING OPEN LEFT INTERNAL MAMMARY ARTERY AND ENDOSOPIC RIGHT GREATER SAPHENOUS VEIN HARVEST. (N/A) MAZE (N/A) TRANSESOPHAGEAL ECHOCARDIOGRAM (TEE) (N/A) CLIPPING OF ATRIAL APPENDAGE USING AN ATRICLIP PRO2 (N/A)  Remains hemodynamically stable, he is off Midodrine.Marland Kitchen Co-ox 61.4--continue Amiodarone, Digoxin, Midodrine INR 2.5, continue 1 mg daily Pulm- off oxygen, no acute issues, continue IS Renal- creatinine at 1.33, remains volume overloaded, diuretics per AHF Dispo- patient stable, needs continued diuretics for volume overload, management per AHF.Marland Kitchen patient will be ready for d/c once volume status and medications are optimized   LOS: 21 days    Lowella Dandy, PA-C 11/14/2021

## 2021-11-14 NOTE — Progress Notes (Signed)
Nutrition Follow-up  DOCUMENTATION CODES:   Not applicable  INTERVENTION:   - Continue Dysphagia 2 diet; add gravy to all meats as well as extra container of gravy on meal trays to moisten food   - Continue Ensure Enlive po QID, each supplement provides 350 kcal and 20 grams of protein   - Continue Magic Cup TID with meals, each supplement provides 290 kcal and 9 grams of protein   - Continue Vital Cuisine Shake TID with meals, each supplement provides 520 kcal and 22 grams of protein   - Continue liquid MVI  NUTRITION DIAGNOSIS:   Increased nutrient needs related to chronic illness (COPD, CHF) as evidenced by estimated needs.  Ongoing, being addressed via oral nutrition supplements  GOAL:   Patient will meet greater than or equal to 90% of their needs  Progressing  MONITOR:   PO intake, Supplement acceptance, Labs, Weight trends, Skin, I & O's  REASON FOR ASSESSMENT:   Malnutrition Screening Tool    ASSESSMENT:   76 y.o. male with  a history of COPD, prediabetes, GERD, hyperlipidemia who presents for the evaluation of chest pain. Pt with acute combined CHF/ICM.  RD working remotely. Unable to reach pt via phone call. PO intake has been variable. Pt accepting most Ensure supplements per [facility] documentation.  Noted plan for outpatient EGD with dilation but may be end of August at the latest.  Per previous RD note, "pt will need to rely heavily on oral nutrition supplements such as Ensure and Magic Cup to meet nutritional needs while inpatient. As outpatient, wife is very familiar with cooking soft, moist foods that pt can tolerate and eat well. Pt to continue to utilize oral nutrition supplements post discharge, especially until GI follow-up."  Will continue to optimize PO with with oral nutrition supplements.  Admit weight: 98 kg Current weight: 96.5 kg  Meal Completion: 0-100% x last 8 documented meals  Medications reviewed and include: dulcolax, colace, Ensure  Enlive QID, IV lasix, SSI, levemir 25 units daily, liquid MVI, protonix, klor-con 40 mEq x 2, senna, warfarin  Labs reviewed: sodium 131, potassium 3.2, BUN 47, creatinine 1.33, WBC 13.5, hemoglobin 7.8 CBG's: 77-138 x 24 hours  UOP: 3175 ml x 24 hours I/O's: -16.0 L since admit  Diet Order:   Diet Order             DIET DYS 2 Room service appropriate? Yes; Fluid consistency: Thin; Fluid restriction: 1800 mL Fluid  Diet effective now                   EDUCATION NEEDS:   Not appropriate for education at this time  Skin:  Skin Assessment: Skin Integrity Issues: DTI: buttocks  Last BM:  11/12/21  Height:   Ht Readings from Last 1 Encounters:  10/24/21 6\' 1"  (1.854 m)    Weight:   Wt Readings from Last 1 Encounters:  11/14/21 96.5 kg    BMI:  Body mass index is 28.07 kg/m.  Estimated Nutritional Needs:   Kcal:  01/14/22  Protein:  115-125 grams  Fluid:  >/= 2 L/day    [id number], MS, RD, LDN Inpatient Clinical Dietitian Please see AMiON for contact information.

## 2021-11-14 NOTE — Plan of Care (Signed)
  Problem: Activity: Goal: Capacity to carry out activities will improve Outcome: Progressing   Problem: Education: Goal: Knowledge of disease or condition will improve Outcome: Progressing Goal: Knowledge of the prescribed therapeutic regimen will improve Outcome: Progressing   Problem: Activity: Goal: Risk for activity intolerance will decrease Outcome: Progressing   Problem: Respiratory: Goal: Respiratory status will improve Outcome: Progressing

## 2021-11-15 ENCOUNTER — Encounter (HOSPITAL_COMMUNITY): Payer: Self-pay | Admitting: *Deleted

## 2021-11-15 DIAGNOSIS — I5043 Acute on chronic combined systolic (congestive) and diastolic (congestive) heart failure: Secondary | ICD-10-CM | POA: Diagnosis not present

## 2021-11-15 LAB — GLUCOSE, CAPILLARY
Glucose-Capillary: 96 mg/dL (ref 70–99)
Glucose-Capillary: 123 mg/dL — ABNORMAL HIGH (ref 70–99)
Glucose-Capillary: 104 mg/dL — ABNORMAL HIGH (ref 70–99)
Glucose-Capillary: 102 mg/dL — ABNORMAL HIGH (ref 70–99)

## 2021-11-15 LAB — BASIC METABOLIC PANEL
Anion gap: 7 (ref 5–15)
BUN: 46 mg/dL — ABNORMAL HIGH (ref 8–23)
CO2: 32 mmol/L (ref 22–32)
Calcium: 8 mg/dL — ABNORMAL LOW (ref 8.9–10.3)
Chloride: 91 mmol/L — ABNORMAL LOW (ref 98–111)
Creatinine, Ser: 1.22 mg/dL (ref 0.61–1.24)
GFR, Estimated: >60 mL/min (ref >60)
Glucose, Bld: 91 mg/dL (ref 70–99)
Potassium: 3.6 mmol/L (ref 3.5–5.1)
Sodium: 130 mmol/L — ABNORMAL LOW (ref 135–145)

## 2021-11-15 LAB — COOXEMETRY PANEL
Carboxyhemoglobin: 2.2 % — ABNORMAL HIGH (ref 0.5–1.5)
Carboxyhemoglobin: 2.9 % — ABNORMAL HIGH (ref 0.5–1.5)
Methemoglobin: <0.7 % (ref 0.0–1.5)
Methemoglobin: 0.9 % (ref 0.0–1.5)
O2 Saturation: 52.8 %
O2 Saturation: 56.5 %
Total hemoglobin: 8.8 g/dL — ABNORMAL LOW (ref 12.0–16.0)
Total hemoglobin: 8.7 g/dL — ABNORMAL LOW (ref 12.0–16.0)

## 2021-11-15 LAB — CBC
HCT: 24.9 % — ABNORMAL LOW (ref 39.0–52.0)
Hemoglobin: 8.2 g/dL — ABNORMAL LOW (ref 13.0–17.0)
MCH: 27.9 pg (ref 26.0–34.0)
MCHC: 32.9 g/dL (ref 30.0–36.0)
MCV: 84.7 fL (ref 80.0–100.0)
Platelets: 316 10*3/uL (ref 150–400)
RBC: 2.94 MIL/uL — ABNORMAL LOW (ref 4.22–5.81)
RDW: 15.4 % (ref 11.5–15.5)
WBC: 13.9 10*3/uL — ABNORMAL HIGH (ref 4.0–10.5)
nRBC: 0 % (ref 0.0–0.2)

## 2021-11-15 LAB — PROTIME-INR
INR: 2.3 — ABNORMAL HIGH (ref 0.8–1.2)
Prothrombin Time: 24.7 seconds — ABNORMAL HIGH (ref 11.4–15.2)

## 2021-11-15 MED ORDER — POTASSIUM CHLORIDE CRYS ER 20 MEQ PO TBCR
60.0000 meq | EXTENDED_RELEASE_TABLET | Freq: Two times a day (BID) | ORAL | Status: AC
Start: 2021-11-15 — End: 2021-11-15
  Administered 2021-11-15 (×2): 60 meq via ORAL
  Filled 2021-11-15 (×2): qty 3

## 2021-11-15 MED ORDER — FUROSEMIDE 10 MG/ML IJ SOLN
80.0000 mg | Freq: Two times a day (BID) | INTRAMUSCULAR | Status: AC
Start: 2021-11-15 — End: 2021-11-15
  Administered 2021-11-15 (×2): 80 mg via INTRAVENOUS
  Filled 2021-11-15 (×2): qty 8

## 2021-11-15 NOTE — Progress Notes (Addendum)
[address]       [place] [address]             [phone]      14 Days Post-Op Procedure(s) (LRB): MITRAL VALVE (MV) REPLACEMENT USING MITRIS VALVE. (N/A) CORONARY ARTERY BYPASS GRAFTING (CABG) X TWO USING OPEN LEFT INTERNAL MAMMARY ARTERY AND ENDOSOPIC RIGHT GREATER SAPHENOUS VEIN HARVEST. (N/A) MAZE (N/A) TRANSESOPHAGEAL ECHOCARDIOGRAM (TEE) (N/A) CLIPPING OF ATRIAL APPENDAGE USING AN ATRICLIP PRO2 (N/A)  Subjective:  Patient sitting up on side of bed.  Wants to go home.    Objective: Vital signs in last 24 hours: Temp:  [98 F (36.7 C)-98.3 F (36.8 C)] 98.3 F (36.8 C) (06/09 0403) Pulse Rate:  [69-75] 70 (06/09 0403) Cardiac Rhythm: Normal sinus rhythm (06/09 0700) Resp:  [16-20] 19 (06/09 0403) BP: (92-114)/(65-77) 94/65 (06/09 0403) SpO2:  [98 %-99 %] 98 % (06/09 0403) Weight:  [96.8 kg] 96.8 kg (06/09 0422)  Hemodynamic parameters for last 24 hours: CVP:  [15 mmHg] 15 mmHg  Intake/Output from previous day: 06/08 0701 - 06/09 0700 In: 240 [P.O.:240] Out: 2500 [Urine:2500]  General appearance: alert, cooperative, and no distress Heart: regular rate and rhythm Lungs: clear to auscultation bilaterally Abdomen: soft, non-tender; bowel sounds normal; no masses,  no organomegaly Extremities: edema + pitting, improving Wound: clean and dry  Lab Results: Recent Labs    11/14/21 0418 11/15/21 0411  WBC 13.5* 13.9*  HGB 7.8* 8.2*  HCT 23.9* 24.9*  PLT 306 316   BMET:  Recent Labs    11/14/21 0751 11/15/21 0411  NA 131* 130*  K 3.2* 3.6  CL 90* 91*  CO2 30 32  GLUCOSE 126* 91  BUN 47* 46*  CREATININE 1.33* 1.22  CALCIUM 8.0* 8.0*    PT/INR:  Recent Labs    11/15/21 0411  LABPROT 24.7*  INR 2.3*   ABG    Component Value Date/Time   PHART 7.300 (L) 11/02/2021 0151   HCO3 23.7 11/02/2021 0151   TCO2 25 11/02/2021 0151   ACIDBASEDEF 3.0 (H) 11/02/2021 0151   O2SAT 52.8 11/15/2021 0411   CBG (last 3)   Recent Labs    11/14/21 1636 11/14/21 2121 11/15/21 0600  GLUCAP 117* 73 96    Assessment/Plan: S/P Procedure(s) (LRB): MITRAL VALVE (MV) REPLACEMENT USING MITRIS VALVE. (N/A) CORONARY ARTERY BYPASS GRAFTING (CABG) X TWO USING OPEN LEFT INTERNAL MAMMARY ARTERY AND ENDOSOPIC RIGHT GREATER SAPHENOUS VEIN HARVEST. (N/A) MAZE (N/A) TRANSESOPHAGEAL ECHOCARDIOGRAM (TEE) (N/A) CLIPPING OF ATRIAL APPENDAGE USING AN ATRICLIP PRO2 (N/A)  Remains hemodynamically stable in NSR.. he is off Milrinone Co-ox this morning is 53, ordered to be repeated by AHF-- remains on Amiodarone 200 mg BID, this could possibly be decreased to 200 mg daily, continue Digoxin, Midodrine INR 2.3, continue coumadin at 1 mg daily Pulm- no acute issues, off oxygen, Renal-creatinine improved at 1.22, remains edematous on exam, repeat IV lasix order again today Dispo- patient stable, patient remains volume overloaded receiving IV diuretics, AHF is optimizing medications as able, continue current care   LOS: 22 days    Lowella Dandy, PA-C  11/15/2021   Chart reviewed, patient examined, agree with above. He has remained hemodynamically stable in sinus rhythm. Wt is slowly coming down but still 8-10 lbs over preop wt and still has significant LE edema. He is eating well, walking well. INR slowly trending down on 1 mg Coumadin daily. If any lower tomorrow will need to increase  dose a little.

## 2021-11-15 NOTE — Progress Notes (Signed)
CARDIAC REHAB PHASE I   PRE:  Rate/Rhythm: 72 SR    BP: sitting 93/69    SaO2: 94 RA  MODE:  Ambulation: 370 ft   POST:  Rate/Rhythm: 84 SR    BP: sitting 128/63     SaO2: 99 RA  Pt frustrated but willing to walk. Stood without assist and walked with rollator, standby assist. Reminders given to walk more upright, esp since arms fatigue. Likes rollator and needs one for d/c. To recliner with legs elevated.    Discussed with pt and wife IS, sternal precautions, daily wts, low sodium diet, exercise, and CRPII. Pt receptive. They have the HF booklet at home. Will refer to [place] CRPII. Encouraged IS and more walking this weekend.  [phone]  Ethelda Chick CES, ACSM 11/15/2021 11:49 AM

## 2021-11-15 NOTE — Progress Notes (Addendum)
Patient ID: Keith Jenkins, male   DOB: 1945-07-25, 76 y.o.   MRN: [id number]     Advanced Heart Failure Rounding Note  PCP-Cardiologist: None   Subjective:    05/19: Admit with a/c CHF. CO-OX okay but appeared low-output >>started milrinone 0.25 05/22: Developed A flutter. Started on amio drip.  05/26: S/p CABG x2 (LIMA-LAD, SVG-RAMUS INT) +MVR, MAZE, LAA Clip  6/1: Barium swallow with distal esophageal stricture. GI consult -> ok to continue po (outpatient f/u for EGD/dilation) 6/4: Midodrine increased to 10 mg tid. Diuresed with IV lasix. Norepi stopped.  6/5: Given IV lasix + tolvaptan.  6/7: Milrinone stopped.   CO-OX 53% off milrinone >> recheck  CVP 12  2.5L UOP charted yesterday with IV lasix 80 BID. Weight up 1 lb.  Scr stable 1.22, K 3.6, Na 130  SBP soft 90s-100s  Feels okay. No dyspnea, still has some LE edema. Eager to get up and walk.   Objective:   Weight Range: 96.8 kg Body mass index is 28.16 kg/m.   Vital Signs:   Temp:  [98 F (36.7 C)-98.3 F (36.8 C)] 98.3 F (36.8 C) (06/09 0403) Pulse Rate:  [69-75] 70 (06/09 0403) Resp:  [16-20] 19 (06/09 0403) BP: (92-114)/(65-77) 94/65 (06/09 0403) SpO2:  [98 %-99 %] 98 % (06/09 0403) Weight:  [96.8 kg] 96.8 kg (06/09 0422) Last BM Date : 11/12/21  Weight change: Filed Weights   11/13/21 0552 11/14/21 0500 11/15/21 0422  Weight: 97.4 kg 96.5 kg 96.8 kg    Intake/Output:   Intake/Output Summary (Last 24 hours) at 11/15/2021 0758 Last data filed at 11/15/2021 0404 Gross per 24 hour  Intake 240 ml  Output 2500 ml  Net -2260 ml      Physical Exam   CVP 12 General:  No distress. Sitting up on side of bed. HEENT: normal Neck: supple. no JVD. Carotids 2+ bilat; no bruits.  Cor: PMI nondisplaced. Regular rate & rhythm. No rubs, gallops or murmurs. Sternum stable. Lungs: clear Abdomen: soft, nontender, nondistended.  Extremities: no cyanosis, clubbing, rash, 2+ edema, TED hose on Neuro: alert &  orientedx3, cranial nerves grossly intact. moves all 4 extremities w/o difficulty. Affect pleasant    Telemetry   SR 60s-70s  Labs    CBC Recent Labs    11/14/21 0418 11/15/21 0411  WBC 13.5* 13.9*  HGB 7.8* 8.2*  HCT 23.9* 24.9*  MCV 86.0 84.7  PLT 306 [id number]   Basic Metabolic Panel Recent Labs    11/13/21 0417 11/14/21 0751 11/15/21 0411  NA 130* 131* 130*  K 3.5 3.2* 3.6  CL 90* 90* 91*  CO2 29 30 32  GLUCOSE 102* 126* 91  BUN 40* 47* 46*  CREATININE 1.14 1.33* 1.22  CALCIUM 7.9* 8.0* 8.0*  MG 2.1  --   --    Liver Function Tests No results for input(s): "AST", "ALT", "ALKPHOS", "BILITOT", "PROT", "ALBUMIN" in the last 72 hours.   No results for input(s): "LIPASE", "AMYLASE" in the last 72 hours. Cardiac Enzymes No results for input(s): "CKTOTAL", "CKMB", "CKMBINDEX", "TROPONINI" in the last 72 hours.  BNP: BNP (last 3 results) Recent Labs    10/10/21 1606 10/22/21 1623 10/24/21 2150  BNP 1,564.6* 1,226.7* 1,306.1*    ProBNP (last 3 results) No results for input(s): "PROBNP" in the last 8760 hours.   D-Dimer No results for input(s): "DDIMER" in the last 72 hours. Hemoglobin A1C No results for input(s): "HGBA1C" in the last 72 hours.  Fasting Lipid Panel  No results for input(s): "CHOL", "HDL", "LDLCALC", "TRIG", "CHOLHDL", "LDLDIRECT" in the last 72 hours. Thyroid Function Tests No results for input(s): "TSH", "T4TOTAL", "T3FREE", "THYROIDAB" in the last 72 hours.  Invalid input(s): "FREET3"  Other results:   Imaging    No results found.   Medications:     Scheduled Medications:  amiodarone  200 mg Oral BID   aspirin  81 mg Oral Daily   atorvastatin  80 mg Oral Daily   bisacodyl  10 mg Oral Daily   Or   bisacodyl  10 mg Rectal Daily   Chlorhexidine Gluconate Cloth  6 each Topical Daily   digoxin  0.125 mg Oral Daily   docusate  200 mg Oral BID   escitalopram  10 mg Oral Daily   feeding supplement  237 mL Oral TID WC & HS    insulin aspart  0-24 Units Subcutaneous TID WC   insulin detemir  25 Units Subcutaneous Daily   latanoprost  1 drop Both Eyes QHS   lidocaine  2 patch Transdermal Q24H   mouth rinse  15 mL Mouth Rinse BID   midodrine  10 mg Oral TID WC   multivitamin  15 mL Oral Daily   pantoprazole  40 mg Oral BID AC   senna  2 tablet Oral QHS   sodium chloride flush  10-40 mL Intracatheter Q12H   sodium chloride flush  3 mL Intravenous Q12H   warfarin  1 mg Oral q1600   Warfarin - Physician Dosing Inpatient   Does not apply q1600    Infusions:  sodium chloride     lactated ringers      PRN Medications: ondansetron (ZOFRAN) IV, oxyCODONE, sodium chloride flush, sodium chloride flush, traMADol    Patient Profile   76 y.o. male with hx COPD, prediabetes, HLD, GERD. Recent admit earlier this month with later presentation inferior STEMI and new systolic CHF w/ moderate to severe ischemic MR.   Readmitted with a/c CHF. Optimized from HF standpoint. Underwent CABG w MVR, MAZE and LAA clip 5/26.   Assessment/Plan   1. CAD/recent late presentation inferior STEMI - LHC 05/04: 100% m RCA and 90% m LAD.  - RCA managed medically d/t completed infarct.  - Echo (5/23) EF 25%, RV moderately reduced, moderate to severe MR - 5/26: S/p CABG x 2 (LIMA-LAD, SVG-Ramus INT)   - ASA + high intensity statin  - No chest pain.   2. Acute on chronic systolic CHF with low-output/ICM - s/p late presentation inferior STEMI 05/23 - Echo 05/23 EF 25-30%, RV moderately reduced, moderate to severe MR. - Limited echo on admit EF 30-35%, RV moderately reduced, MR not well evaluated, moderate pericardial effusion - S/p CABG + bioprosthetic MVR 5/26 - Now off NE and Milrinone  - Co-ox 53% this am. Recheck.  - CVP 12. Still looks volume overloaded. Give IV lasix 80 BID again today - Continue midodrine 10 mg tid.  - Continue digoxin 0.125, dig level 0.7 on 06/05.   - Eventually add back GDMT as blood pressure  allows  3. Mitral regurgitation - Moderate to severe ischemic MR on TEE.  - S/p bioprosthetic MVR 11/02/21 - On warfarin per TCTS.    4. Aortic valve insufficiency - Mild to moderate AS/AI - stable.    5. Pericardial effusion/Acute Pericarditis  - Moderate in size on echo, no respiratory variation or RV collapse to suggest tamponade - acute pericarditis/ adhensions noted at time of CABG  6. H/o AKI - Scr  stable - Watch renal function closely    7. Prediabetes - A1c 5.8 1/23 - holding [place] for now   8. PAF - s/p MAZE w/ CABG  - maintaining SR. On po amiodarone 200 mg BID. Hopefully can stop soon. - warfarin started, INR 2.3 today.  9. Hyponatremia  - Na 130 - Fluid restrict. - c/w diuresis   10. Post-op ileus/esophageal stricture - also has h/o esophageal stricture (was unable to pass TEE probe transgastric prior to CABG) - Barium swallow with distal stricture. GI has seen. Appreciate recs. Outpatient EGD with dilation  11. Post-op anemia - Hgb 8.2, stable.  Transfuse < 7.5.   12. Hypokalemia: - K 3.6 - Supp with diuresis  Continue to mobilize. PT recommending [facility].   Length of Stay: [place], [place], PA-C  11/15/2021, 7:58 AM  Advanced Heart Failure Team Pager [phone] (M-F; 7a - 5p)  Please contact [facility] for night-coverage after hours (5p -7a ) and weekends on [email or web address]  Patient seen with PA, agree with the above note.   CVP gradually coming down, 12 today.  Repeat co-ox ok at 57%.   Walking in halls, feels good.   General: NAD Neck: JVP 12 cm, no thyromegaly or thyroid nodule.  Lungs: Clear to auscultation bilaterally with normal respiratory effort. CV: Nondisplaced PMI.  Heart regular S1/S2, no S3/S4, no murmur.  1+ edema to knees.  Abdomen: Soft, nontender, no hepatosplenomegaly, no distention.  Skin: Intact without lesions or rashes.  Neurologic: Alert and oriented x 3.  Psych: Normal affect. Extremities: No clubbing or  cyanosis.  HEENT: Normal.   Still with volume overload, continue IV Lasix today.    SBP still 90s, continue current midodrine while diuresing.  Continue digoxin. Co-ox adequate off milrinone at 57%.   Continue to mobilize.   Loralie Champagne 11/15/2021 9:01 AM

## 2021-11-15 NOTE — Progress Notes (Signed)
Pt's daughter forms for intermittent FMLA completed, she is aware and will pick up at front desk.

## 2021-11-15 NOTE — TOC Initial Note (Signed)
Transition of Care [facility]) - Initial/Assessment Note    Patient Details  Name: Keith Jenkins MRN: [id number] Date of Birth: [birth year]  Transition of Care [facility]) CM/SW Contact:    Erenest Rasher, RN Phone Number: [phone] 11/15/2021, 12:16 PM  Clinical Narrative:                 HF TOC CM spoke to pt and wife at bedside. Gave permission to speak to dtr, Keith Jenkins. Tim Lair will be his caregiver in the home. Offered choice for HH (medicare.gov list with ratings given and placed on chart). Dtr agreeable to agency that will staff in their area. Contacted [facility] rep, Marjory Lies with new referral. [facility] rep. Lucrecia for delivery of Rollator and 3n1 as may dc over weekend.   Pt's [name] hours on Sat are from 9-1 pm. Pt states to use [facility] [address] if dc on weekend and meds are ordered.   Expected Discharge Plan: [place] Barriers to Discharge: Continued Medical Work up   Patient Goals and CMS Choice Patient states their goals for this hospitalization and ongoing recovery are:: wants to get better CMS Medicare.gov Compare Post Acute Care list provided to:: Patient Represenative (must comment) (Dtr, Keith Jenkins) Choice offered to / list presented to : Adult Children  Expected Discharge Plan and Services Expected Discharge Plan: [place]   Discharge Planning Services: CM Consult Post Acute Care Choice: Home Health Living arrangements for the past 2 months: Single Family Home                 DME Arranged: 3-N-1, Walker rolling with seat DME Agency: [employer] Date DME Agency Contacted: 11/15/21 Time DME Agency Contacted: 1215 Representative spoke with at DME Agency: [place]: RN, PT [place] Agency: [place] Date [place]: 11/15/21 Time [place]: 1216 Representative spoke with at [place]: Romie Jumper  Prior Living Arrangements/Services Living arrangements for the past 2  months: [place] with:: Spouse Patient language and need for interpreter reviewed:: Yes Do you feel safe going back to the place where you live?: Yes      Need for Family Participation in Patient Care: Yes (Comment) Care giver support system in place?: Yes (comment) Current home services:  (cane) Criminal Activity/Legal Involvement Pertinent to Current Situation/Hospitalization: No - Comment as needed  Activities of Daily Living Home Assistive Devices/Equipment: Cane (specify quad or straight) ADL Screening (condition at time of admission) Patient's cognitive ability adequate to safely complete daily activities?: Yes Is the patient deaf or have difficulty hearing?: No Does the patient have difficulty seeing, even when wearing glasses/contacts?: No Does the patient have difficulty concentrating, remembering, or making decisions?: No Patient able to express need for assistance with ADLs?: Yes Does the patient have difficulty dressing or bathing?: No Independently performs ADLs?: Yes (appropriate for developmental age) Does the patient have difficulty walking or climbing stairs?: No Weakness of Legs: None Weakness of Arms/Hands: None  Permission Sought/Granted Permission sought to share information with : Case Manager, Family Supports, PCP Permission granted to share information with : Yes, Verbal Permission Granted  Share Information with NAME: Keith Jenkins     Permission granted to share info w Relationship: wife  Permission granted to share info w Contact Information: [phone]  Emotional Assessment Appearance:: Appears stated age Attitude/Demeanor/Rapport: Engaged Affect (typically observed): Accepting Orientation: : Oriented to Self, Oriented to Place, Oriented to  Time, Oriented to Situation   Psych Involvement: No (comment)  Admission diagnosis:  Acute on chronic combined systolic (congestive) and diastolic (congestive) heart failure (HCC) [I50.43] Mitral  regurgitation [I34.0] S/P mitral valve replacement [Z95.2] Patient Active Problem List   Diagnosis Date Noted   Esophageal dysphagia    Mitral regurgitation 11/01/2021   S/P mitral valve replacement 11/01/2021   Acute on chronic combined systolic (congestive) and diastolic (congestive) heart failure ([place]) 10/24/2021   Pericardial effusion    Severe mitral regurgitation    NSTEMI (non-ST elevated myocardial infarction) ([place]) 10/10/2021   Non-ST elevation (NSTEMI) myocardial infarction ([place]) [id number]   Acute systolic heart failure ([place]) 10/10/2021   Hyperlipidemia 10/10/2021   PCP:  Rip Harbour, NP Pharmacy:   [facility], [address] [place] [address] Phone: [phone] Fax: [phone]  [facility] [address] [address] Phone: [phone] Fax: [phone]     Social Determinants of Health (SDOH) Interventions    Readmission Risk Interventions     No data to display

## 2021-11-15 NOTE — Plan of Care (Signed)
  Problem: Education: Goal: Ability to demonstrate management of disease process will improve Outcome: Progressing Goal: Ability to verbalize understanding of medication therapies will improve Outcome: Progressing Goal: Individualized Educational Video(s) Outcome: Progressing   Problem: Cardiac: Goal: Ability to achieve and maintain adequate cardiopulmonary perfusion will improve Outcome: Progressing   Problem: Education: Goal: Will demonstrate proper wound care and an understanding of methods to prevent future damage Outcome: Progressing Goal: Knowledge of disease or condition will improve Outcome: Progressing Goal: Knowledge of the prescribed therapeutic regimen will improve Outcome: Progressing Goal: Individualized Educational Video(s) Outcome: Progressing   Problem: Clinical Measurements: Goal: Postoperative complications will be avoided or minimized Outcome: Progressing   Problem: Respiratory: Goal: Respiratory status will improve Outcome: Progressing   Problem: Skin Integrity: Goal: Wound healing without signs and symptoms of infection Outcome: Progressing Goal: Risk for impaired skin integrity will decrease Outcome: Progressing   Problem: Urinary Elimination: Goal: Ability to achieve and maintain adequate renal perfusion and functioning will improve Outcome: Progressing

## 2021-11-16 DIAGNOSIS — I5043 Acute on chronic combined systolic (congestive) and diastolic (congestive) heart failure: Secondary | ICD-10-CM | POA: Diagnosis not present

## 2021-11-16 LAB — CBC
HCT: 26.9 % — ABNORMAL LOW (ref 39.0–52.0)
Hemoglobin: 8.8 g/dL — ABNORMAL LOW (ref 13.0–17.0)
MCH: 27.8 pg (ref 26.0–34.0)
MCHC: 32.7 g/dL (ref 30.0–36.0)
MCV: 85.1 fL (ref 80.0–100.0)
Platelets: 349 10*3/uL (ref 150–400)
RBC: 3.16 MIL/uL — ABNORMAL LOW (ref 4.22–5.81)
RDW: 15.3 % (ref 11.5–15.5)
WBC: 13.5 10*3/uL — ABNORMAL HIGH (ref 4.0–10.5)
nRBC: 0 % (ref 0.0–0.2)

## 2021-11-16 LAB — COOXEMETRY PANEL
Carboxyhemoglobin: 1.9 % — ABNORMAL HIGH (ref 0.5–1.5)
Methemoglobin: <0.7 % (ref 0.0–1.5)
O2 Saturation: 57.8 %
Total hemoglobin: 8.4 g/dL — ABNORMAL LOW (ref 12.0–16.0)

## 2021-11-16 LAB — BASIC METABOLIC PANEL
Anion gap: 8 (ref 5–15)
BUN: 43 mg/dL — ABNORMAL HIGH (ref 8–23)
CO2: 33 mmol/L — ABNORMAL HIGH (ref 22–32)
Calcium: 8.1 mg/dL — ABNORMAL LOW (ref 8.9–10.3)
Chloride: 91 mmol/L — ABNORMAL LOW (ref 98–111)
Creatinine, Ser: 1.14 mg/dL (ref 0.61–1.24)
GFR, Estimated: >60 mL/min (ref >60)
Glucose, Bld: 74 mg/dL (ref 70–99)
Potassium: 3.8 mmol/L (ref 3.5–5.1)
Sodium: 132 mmol/L — ABNORMAL LOW (ref 135–145)

## 2021-11-16 LAB — GLUCOSE, CAPILLARY
Glucose-Capillary: 82 mg/dL (ref 70–99)
Glucose-Capillary: 89 mg/dL (ref 70–99)
Glucose-Capillary: 103 mg/dL — ABNORMAL HIGH (ref 70–99)

## 2021-11-16 LAB — PROTIME-INR
INR: 2.5 — ABNORMAL HIGH (ref 0.8–1.2)
Prothrombin Time: 27.2 seconds — ABNORMAL HIGH (ref 11.4–15.2)

## 2021-11-16 MED ORDER — FUROSEMIDE 10 MG/ML IJ SOLN
80.0000 mg | Freq: Two times a day (BID) | INTRAMUSCULAR | Status: DC
  Administered 2021-11-16 – 2021-11-17 (×4): 80 mg via INTRAVENOUS
  Filled 2021-11-16 (×4): qty 8

## 2021-11-16 MED ORDER — FUROSEMIDE 10 MG/ML IJ SOLN: 80.0000 mg | Freq: Two times a day (BID) | INTRAMUSCULAR | Status: DC

## 2021-11-16 MED ORDER — POTASSIUM CHLORIDE CRYS ER 20 MEQ PO TBCR
40.0000 meq | EXTENDED_RELEASE_TABLET | Freq: Two times a day (BID) | ORAL | Status: AC
Start: 2021-11-16 — End: 2021-11-16
  Administered 2021-11-16 (×2): 40 meq via ORAL
  Filled 2021-11-16 (×2): qty 2

## 2021-11-16 MED ORDER — METOLAZONE 2.5 MG PO TABS
2.5000 mg | ORAL_TABLET | Freq: Once | ORAL | Status: AC
  Administered 2021-11-16: 2.5 mg via ORAL
  Filled 2021-11-16: qty 1

## 2021-11-16 MED ORDER — DAPAGLIFLOZIN PROPANEDIOL 10 MG PO TABS
10.0000 mg | ORAL_TABLET | Freq: Every day | ORAL | Status: DC
  Administered 2021-11-16 – 2021-11-18 (×3): 10 mg via ORAL
  Filled 2021-11-16 (×3): qty 1

## 2021-11-16 NOTE — Progress Notes (Signed)
Mobility Specialist Progress Note    11/16/21 1421  Mobility  Activity Refused mobility   Attempted x2 this am and pm. Stated he feels weak. Will f/u as schedule permits.   [place] Mobility Specialist

## 2021-11-16 NOTE — Progress Notes (Signed)
Patient ID: Keith Jenkins, male   DOB: [birth year], 76 y.o.   MRN: [id number]     Advanced Heart Failure Rounding Note  PCP-Cardiologist: None   Subjective:    05/19: Admit with a/c CHF. CO-OX okay but appeared low-output >>started milrinone 0.25 05/22: Developed A flutter. Started on amio drip.  05/26: S/p CABG x2 (LIMA-LAD, SVG-RAMUS INT) +MVR, MAZE, LAA Clip  6/1: Barium swallow with distal esophageal stricture. GI consult -> ok to continue po (outpatient f/u for EGD/dilation) 6/4: Midodrine increased to 10 mg tid. Diuresed with IV lasix. Norepi stopped.  6/5: Given IV lasix + tolvaptan.  6/7: Milrinone stopped.   CO-OX 58% off milrinone.  CVP not functioning today.  He did not diurese well yesterday (at least not recorded) and weight down about 1 lb. Creatinine stable 1.14.   SBP remains 90s.  Still feels swollen.    Objective:   Weight Range: 96.2 kg Body mass index is 27.97 kg/m.   Vital Signs:   Temp:  [97.7 F (36.5 C)-98.8 F (37.1 C)] 98.3 F (36.8 C) (06/10 0734) Pulse Rate:  [67-74] 71 (06/10 0734) Resp:  [16-20] 17 (06/10 0734) BP: (83-103)/(59-67) 83/59 (06/10 0734) SpO2:  [94 %-99 %] 94 % (06/10 0734) Weight:  [96.2 kg] 96.2 kg (06/10 0546) Last BM Date : 11/15/21  Weight change: Filed Weights   11/14/21 0500 11/15/21 0422 11/16/21 0546  Weight: 96.5 kg 96.8 kg 96.2 kg    Intake/Output:   Intake/Output Summary (Last 24 hours) at 11/16/2021 0926 Last data filed at 11/16/2021 0416 Gross per 24 hour  Intake 1077 ml  Output 600 ml  Net 477 ml      Physical Exam   General: NAD Neck: JVP 12 cm, no thyromegaly or thyroid nodule.  Lungs: Clear to auscultation bilaterally with normal respiratory effort. CV: Nondisplaced PMI.  Heart regular S1/S2, no S3/S4, no murmur.  2+ edema to knees.  Abdomen: Soft, nontender, no hepatosplenomegaly, no distention.  Skin: Intact without lesions or rashes.  Neurologic: Alert and oriented x 3.  Psych: Normal  affect. Extremities: No clubbing or cyanosis.  HEENT: Normal.   Telemetry   SR 60s-70s  Labs    CBC Recent Labs    11/14/21 0418 11/15/21 0411  WBC 13.5* 13.9*  HGB 7.8* 8.2*  HCT 23.9* 24.9*  MCV 86.0 84.7  PLT 306 [id number]   Basic Metabolic Panel Recent Labs    11/15/21 0411 11/16/21 0430  NA 130* 132*  K 3.6 3.8  CL 91* 91*  CO2 32 33*  GLUCOSE 91 74  BUN 46* 43*  CREATININE 1.22 1.14  CALCIUM 8.0* 8.1*   Liver Function Tests No results for input(s): "AST", "ALT", "ALKPHOS", "BILITOT", "PROT", "ALBUMIN" in the last 72 hours.   No results for input(s): "LIPASE", "AMYLASE" in the last 72 hours. Cardiac Enzymes No results for input(s): "CKTOTAL", "CKMB", "CKMBINDEX", "TROPONINI" in the last 72 hours.  BNP: BNP (last 3 results) Recent Labs    10/10/21 1606 10/22/21 1623 10/24/21 2150  BNP 1,564.6* 1,226.7* 1,306.1*    ProBNP (last 3 results) No results for input(s): "PROBNP" in the last 8760 hours.   D-Dimer No results for input(s): "DDIMER" in the last 72 hours. Hemoglobin A1C No results for input(s): "HGBA1C" in the last 72 hours.  Fasting Lipid Panel No results for input(s): "CHOL", "HDL", "LDLCALC", "TRIG", "CHOLHDL", "LDLDIRECT" in the last 72 hours. Thyroid Function Tests No results for input(s): "TSH", "T4TOTAL", "T3FREE", "THYROIDAB" in the last 72 hours.  Invalid input(s): "FREET3"  Other results:   Imaging    No results found.   Medications:     Scheduled Medications:  amiodarone  200 mg Oral BID   aspirin  81 mg Oral Daily   atorvastatin  80 mg Oral Daily   bisacodyl  10 mg Oral Daily   Or   bisacodyl  10 mg Rectal Daily   Chlorhexidine Gluconate Cloth  6 each Topical Daily   dapagliflozin propanediol  10 mg Oral Daily   digoxin  0.125 mg Oral Daily   docusate  200 mg Oral BID   escitalopram  10 mg Oral Daily   feeding supplement  237 mL Oral TID WC & HS   furosemide  80 mg Intravenous BID   insulin aspart  0-24 Units  Subcutaneous TID WC   insulin detemir  25 Units Subcutaneous Daily   latanoprost  1 drop Both Eyes QHS   lidocaine  2 patch Transdermal Q24H   mouth rinse  15 mL Mouth Rinse BID   metolazone  2.5 mg Oral Once   midodrine  10 mg Oral TID WC   multivitamin  15 mL Oral Daily   pantoprazole  40 mg Oral BID AC   potassium chloride  40 mEq Oral BID   senna  2 tablet Oral QHS   sodium chloride flush  10-40 mL Intracatheter Q12H   sodium chloride flush  3 mL Intravenous Q12H   warfarin  1 mg Oral q1600   Warfarin - Physician Dosing Inpatient   Does not apply q1600    Infusions:  sodium chloride     lactated ringers      PRN Medications: ondansetron (ZOFRAN) IV, oxyCODONE, sodium chloride flush, sodium chloride flush, traMADol    Patient Profile   76 y.o. male with hx COPD, prediabetes, HLD, GERD. Recent admit earlier this month with later presentation inferior STEMI and new systolic CHF w/ moderate to severe ischemic MR.   Readmitted with a/c CHF. Optimized from HF standpoint. Underwent CABG w MVR, MAZE and LAA clip 5/26.   Assessment/Plan   1. CAD/recent late presentation inferior STEMI - LHC 05/04: 100% m RCA and 90% m LAD.  - RCA managed medically d/t completed infarct.  - Echo (5/23) EF 25%, RV moderately reduced, moderate to severe MR - 5/26: S/p CABG x 2 (LIMA-LAD, SVG-Ramus INT)   - ASA + high intensity statin  - No chest pain.   2. Acute on chronic systolic CHF with low-output/ICM - s/p late presentation inferior STEMI 05/23 - Echo 05/23 EF 25-30%, RV moderately reduced, moderate to severe MR. - Limited echo on admit EF 30-35%, RV moderately reduced, MR not well evaluated, moderate pericardial effusion - S/p CABG + bioprosthetic MVR 5/26 - Now off NE and Milrinone  - Co-ox 58% this am.   - CVP not functioning today but he still looks volume overloaded.  Will give Lasix 80 mg IV bid and add metolazone 2.5 today.  Replace K.  - Will add Farxiga 10 mg daily.  -  Continue midodrine 10 mg tid, will not wean yet with SBP 90s and diuresing.   - Continue digoxin 0.125.   - Eventually add back GDMT as blood pressure allows  3. Mitral regurgitation - Moderate to severe ischemic MR on TEE.  - S/p bioprosthetic MVR 11/02/21 - On warfarin per TCTS.    4. Aortic valve insufficiency - Mild to moderate AS/AI - stable.    5. Pericardial effusion/Acute Pericarditis  -  Moderate in size on echo, no respiratory variation or RV collapse to suggest tamponade - acute pericarditis/ adhensions noted at time of CABG  6. H/o AKI - Scr stable - Watch renal function closely, stable today.    7. PAF - s/p MAZE w/ CABG  - maintaining SR. On po amiodarone 200 mg BID. Hopefully can stop soon. - warfarin started, INR 2.3 today.  8. Hyponatremia  - Na 132 - Fluid restrict. - c/w diuresis   9. Post-op ileus/esophageal stricture - also has h/o esophageal stricture (was unable to pass TEE probe transgastric prior to CABG) - Barium swallow with distal stricture. GI has seen. Appreciate recs. Outpatient EGD with dilation  10. Post-op anemia - Transfuse < 7.5.  - Needs CBC  12. Hypokalemia: - K 3.8 - Supp with diuresis  Continue to mobilize. PT recommending [facility].   Length of Stay: 8  Loralie Champagne, MD  11/16/2021, 9:26 AM  Advanced Heart Failure Team Pager [phone] (M-F; 7a - 5p)  Please contact [facility] for night-coverage after hours (5p -7a ) and weekends on [email or web address]

## 2021-11-16 NOTE — Progress Notes (Addendum)
CARDIAC REHAB PHASE I   PRE:  Rate/Rhythm: 75 SR  BP:  Sitting: 96/56      SaO2: 97 RA  MODE:  Ambulation: 370 ft   POST:  Rate/Rhythm: 89 SR  BP:  Sitting: 106/67      SaO2: 97 RA  Pt tolerated exercise fair and amb 370 ft with rollator, and stand-by assist. Pt denies CP or dizziness throughout walk. Pt did have some SOB during the walk and took a couple of rest breaks. Pulse ox on pt with 97 RA. Pt to bed. RN in room giving meds to pt. Will continue to follow.  [phone] Joya San, MS, ACSM-CEP 11/16/2021 10:22 AM

## 2021-11-16 NOTE — Progress Notes (Addendum)
      [address]       [place] [address]             [phone]        15 Days Post-Op Procedure(s) (LRB): MITRAL VALVE (MV) REPLACEMENT USING MITRIS VALVE. (N/A) CORONARY ARTERY BYPASS GRAFTING (CABG) X TWO USING OPEN LEFT INTERNAL MAMMARY ARTERY AND ENDOSOPIC RIGHT GREATER SAPHENOUS VEIN HARVEST. (N/A) MAZE (N/A) TRANSESOPHAGEAL ECHOCARDIOGRAM (TEE) (N/A) CLIPPING OF ATRIAL APPENDAGE USING AN ATRICLIP PRO2 (N/A)  Subjective: Patient without specific complaints this am.  Objective: Vital signs in last 24 hours: Temp:  [97.7 F (36.5 C)-98.8 F (37.1 C)] 98.3 F (36.8 C) (06/10 0734) Pulse Rate:  [67-74] 71 (06/10 0734) Cardiac Rhythm: Normal sinus rhythm (06/10 0700) Resp:  [16-20] 17 (06/10 0734) BP: (83-103)/(59-67) 83/59 (06/10 0734) SpO2:  [94 %-99 %] 94 % (06/10 0734) Weight:  [96.2 kg] 96.2 kg (06/10 0546)   Current Weight  11/16/21 96.2 kg    Hemodynamic parameters for last 24 hours: CVP:  [9 mmHg-11 mmHg] 11 mmHg  Intake/Output from previous day: 06/09 0701 - 06/10 0700 In: 1441.1 [P.O.:1437; I.V.:4.1] Out: 600 [Urine:600]   Physical Exam:  Cardiovascular: RRR Pulmonary: Clear to auscultation bilaterally Abdomen: Soft, non tender, bowel sounds present. Extremities: Mild bilateral lower extremity edema. Ted hose in place. Wounds: Clean and dry.  No erythema or signs of infection.  Lab Results: CBC: Recent Labs    11/14/21 0418 11/15/21 0411  WBC 13.5* 13.9*  HGB 7.8* 8.2*  HCT 23.9* 24.9*  PLT 306 316   BMET:  Recent Labs    11/15/21 0411 11/16/21 0430  NA 130* 132*  K 3.6 3.8  CL 91* 91*  CO2 32 33*  GLUCOSE 91 74  BUN 46* 43*  CREATININE 1.22 1.14  CALCIUM 8.0* 8.1*    PT/INR:  Lab Results  Component Value Date   INR 2.5 (H) 11/16/2021   INR 2.3 (H) 11/15/2021   INR 2.5 (H) 11/14/2021   ABG:  INR: Will add last result for INR, ABG once components are confirmed Will add last 4 CBG results  once components are confirmed  Assessment/Plan:  1. CV - S/p inferior STEMI. History of PAF. SR. SBP has been 90's but more labile this am. INR this am slightly increased to 2.5. Co ox this am 57.8. On Amiodarone 200 mg bid, Digoxin 0.125 mg daily, Midodrine 10 mg tid, and Coumadin. 2.  Pulmonary - On room air. Encourage incentive spirometer. 3. Acute on chronic systolic CHF with low LVEF-On Farxiga 10 mg daily, Metolazone 2.5 mg once, and Lasix 80 mg IV bid. AHF following. 4.  Expected post op acute blood loss anemia - last H and H stable at 8.2 and 24.9 5. CBGs 104/102/82. Pre op HGA1C 6. Will need further surveillance for pre diabetes after discharge 6. Supplement potassium 7. Once medications optimized and ok with advanced heart failure, will discharge  Lelon Huh ZimmermanPA-C 9:23 AM   Patient seen and examined, agree with above  Viviann Spare C. Dorris Fetch, MD [facility] [phone]

## 2021-11-16 NOTE — Plan of Care (Signed)
  Problem: Education: Goal: Will demonstrate proper wound care and an understanding of methods to prevent future damage Outcome: Progressing Goal: Knowledge of disease or condition will improve Outcome: Progressing Goal: Knowledge of the prescribed therapeutic regimen will improve Outcome: Progressing   Problem: Activity: Goal: Risk for activity intolerance will decrease Outcome: Progressing   

## 2021-11-17 DIAGNOSIS — I5043 Acute on chronic combined systolic (congestive) and diastolic (congestive) heart failure: Secondary | ICD-10-CM | POA: Diagnosis not present

## 2021-11-17 LAB — BASIC METABOLIC PANEL
Anion gap: 9 (ref 5–15)
Anion gap: 8 (ref 5–15)
BUN: 38 mg/dL — ABNORMAL HIGH (ref 8–23)
BUN: 38 mg/dL — ABNORMAL HIGH (ref 8–23)
CO2: 34 mmol/L — ABNORMAL HIGH (ref 22–32)
CO2: 30 mmol/L (ref 22–32)
Calcium: 8.1 mg/dL — ABNORMAL LOW (ref 8.9–10.3)
Calcium: 8.1 mg/dL — ABNORMAL LOW (ref 8.9–10.3)
Chloride: 89 mmol/L — ABNORMAL LOW (ref 98–111)
Chloride: 92 mmol/L — ABNORMAL LOW (ref 98–111)
Creatinine, Ser: 1.19 mg/dL (ref 0.61–1.24)
Creatinine, Ser: 1.27 mg/dL — ABNORMAL HIGH (ref 0.61–1.24)
GFR, Estimated: >60 mL/min (ref >60)
GFR, Estimated: 59 mL/min — ABNORMAL LOW (ref >60)
Glucose, Bld: 73 mg/dL (ref 70–99)
Glucose, Bld: 140 mg/dL — ABNORMAL HIGH (ref 70–99)
Potassium: 3 mmol/L — ABNORMAL LOW (ref 3.5–5.1)
Potassium: 3.6 mmol/L (ref 3.5–5.1)
Sodium: 132 mmol/L — ABNORMAL LOW (ref 135–145)
Sodium: 130 mmol/L — ABNORMAL LOW (ref 135–145)

## 2021-11-17 LAB — COOXEMETRY PANEL
Carboxyhemoglobin: 2 % — ABNORMAL HIGH (ref 0.5–1.5)
Methemoglobin: 1.2 % (ref 0.0–1.5)
O2 Saturation: 61 %
Total hemoglobin: 9 g/dL — ABNORMAL LOW (ref 12.0–16.0)

## 2021-11-17 LAB — CBC
HCT: 25.7 % — ABNORMAL LOW (ref 39.0–52.0)
Hemoglobin: 8.6 g/dL — ABNORMAL LOW (ref 13.0–17.0)
MCH: 28.1 pg (ref 26.0–34.0)
MCHC: 33.5 g/dL (ref 30.0–36.0)
MCV: 84 fL (ref 80.0–100.0)
Platelets: 306 10*3/uL (ref 150–400)
RBC: 3.06 MIL/uL — ABNORMAL LOW (ref 4.22–5.81)
RDW: 15.4 % (ref 11.5–15.5)
WBC: 11.8 10*3/uL — ABNORMAL HIGH (ref 4.0–10.5)
nRBC: 0 % (ref 0.0–0.2)

## 2021-11-17 LAB — PROTIME-INR
INR: 2.1 — ABNORMAL HIGH (ref 0.8–1.2)
Prothrombin Time: 23.7 seconds — ABNORMAL HIGH (ref 11.4–15.2)

## 2021-11-17 LAB — MAGNESIUM: Magnesium: 2.1 mg/dL (ref 1.7–2.4)

## 2021-11-17 LAB — GLUCOSE, CAPILLARY
Glucose-Capillary: 81 mg/dL (ref 70–99)
Glucose-Capillary: 161 mg/dL — ABNORMAL HIGH (ref 70–99)
Glucose-Capillary: 98 mg/dL (ref 70–99)
Glucose-Capillary: 88 mg/dL (ref 70–99)

## 2021-11-17 MED ORDER — POTASSIUM CHLORIDE CRYS ER 20 MEQ PO TBCR
40.0000 meq | EXTENDED_RELEASE_TABLET | ORAL | Status: AC
  Administered 2021-11-17 (×2): 40 meq via ORAL
  Filled 2021-11-17 (×2): qty 2

## 2021-11-17 MED ORDER — POTASSIUM CHLORIDE 10 MEQ/100ML IV SOLN
10.0000 meq | INTRAVENOUS | Status: AC
  Administered 2021-11-17 (×5): 10 meq via INTRAVENOUS
  Filled 2021-11-17 (×5): qty 100

## 2021-11-17 MED ORDER — SPIRONOLACTONE 12.5 MG HALF TABLET
12.5000 mg | ORAL_TABLET | Freq: Every day | ORAL | Status: DC
  Administered 2021-11-17 – 2021-11-18 (×2): 12.5 mg via ORAL
  Filled 2021-11-17 (×2): qty 1

## 2021-11-17 MED ORDER — METOLAZONE 2.5 MG PO TABS
2.5000 mg | ORAL_TABLET | Freq: Once | ORAL | Status: AC
  Administered 2021-11-17: 2.5 mg via ORAL
  Filled 2021-11-17: qty 1

## 2021-11-17 MED ORDER — POTASSIUM CHLORIDE CRYS ER 20 MEQ PO TBCR
40.0000 meq | EXTENDED_RELEASE_TABLET | ORAL | Status: AC
  Administered 2021-11-17 – 2021-11-18 (×2): 40 meq via ORAL
  Filled 2021-11-17 (×2): qty 2

## 2021-11-17 NOTE — Plan of Care (Signed)
  Problem: Education: Goal: Knowledge of disease or condition will improve Outcome: Progressing   Problem: Activity: Goal: Risk for activity intolerance will decrease Outcome: Progressing   Problem: Cardiac: Goal: Will achieve and/or maintain hemodynamic stability Outcome: Progressing   Problem: Clinical Measurements: Goal: Postoperative complications will be avoided or minimized Outcome: Progressing   Problem: Respiratory: Goal: Respiratory status will improve Outcome: Progressing   Problem: Skin Integrity: Goal: Wound healing without signs and symptoms of infection Outcome: Progressing Goal: Risk for impaired skin integrity will decrease Outcome: Progressing   Problem: Urinary Elimination: Goal: Ability to achieve and maintain adequate renal perfusion and functioning will improve Outcome: Progressing

## 2021-11-17 NOTE — Progress Notes (Signed)
Patient ID: Keith Jenkins, male   DOB: 29-May-1946, 76 y.o.   MRN: [id number]     Advanced Heart Failure Rounding Note  PCP-Cardiologist: None   Subjective:    05/19: Admit with a/c CHF. CO-OX okay but appeared low-output >>started milrinone 0.25 05/22: Developed A flutter. Started on amio drip.  05/26: S/p CABG x2 (LIMA-LAD, SVG-RAMUS INT) +MVR, MAZE, LAA Clip  6/1: Barium swallow with distal esophageal stricture. GI consult -> ok to continue po (outpatient f/u for EGD/dilation) 6/4: Midodrine increased to 10 mg tid. Diuresed with IV lasix. Norepi stopped.  6/5: Given IV lasix + tolvaptan.  6/7: Milrinone stopped.   CO-OX 61% off milrinone.  Good diuresis yesterday with IV lasix and metolazone. CVP 9 Weight down 5 pounds Scr stable  K 3.0   Large BM yesterday.   Objective:   Weight Range: 94.2 kg Body mass index is 27.39 kg/m.   Vital Signs:   Temp:  [97.6 F (36.4 C)-98.2 F (36.8 C)] 98.2 F (36.8 C) (06/11 0731) Pulse Rate:  [70-84] 72 (06/11 0731) Resp:  [17-21] 20 (06/11 0731) BP: (91-119)/(61-74) 105/69 (06/11 0856) SpO2:  [94 %-100 %] 94 % (06/11 0731) Weight:  [94.2 kg] 94.2 kg (06/11 0900) Last BM Date : 11/16/21  Weight change: Filed Weights   11/15/21 0422 11/16/21 0546 11/17/21 0900  Weight: 96.8 kg 96.2 kg 94.2 kg    Intake/Output:   Intake/Output Summary (Last 24 hours) at 11/17/2021 1000 Last data filed at 11/16/2021 2348 Gross per 24 hour  Intake 480 ml  Output 3700 ml  Net -3220 ml       Physical Exam   General:  Sitting up  No resp difficulty HEENT: normal Neck: supple. JVP 9  Carotids 2+ bilat; no bruits. No lymphadenopathy or thryomegaly appreciated. Cor: wound healed PMI nondisplaced. Regular rate & rhythm. No rubs, gallops or murmurs. Lungs: clear Abdomen: soft, nontender, nondistended. No hepatosplenomegaly. No bruits or masses. Good bowel sounds. Extremities: no cyanosis, clubbing, rash, 2+ edema over TED hose Neuro: alert &  orientedx3, cranial nerves grossly intact. moves all 4 extremities w/o difficulty. Affect pleasant   Telemetry   SR 70s Personally reviewed  Labs    CBC Recent Labs    11/16/21 0934 11/17/21 0418  WBC 13.5* 11.8*  HGB 8.8* 8.6*  HCT 26.9* 25.7*  MCV 85.1 84.0  PLT 349 [id number]    Basic Metabolic Panel Recent Labs    11/16/21 0430 11/17/21 0418  NA 132* 132*  K 3.8 3.0*  CL 91* 89*  CO2 33* 34*  GLUCOSE 74 73  BUN 43* 38*  CREATININE 1.14 1.19  CALCIUM 8.1* 8.1*  MG  --  2.1    Liver Function Tests No results for input(s): "AST", "ALT", "ALKPHOS", "BILITOT", "PROT", "ALBUMIN" in the last 72 hours.   No results for input(s): "LIPASE", "AMYLASE" in the last 72 hours. Cardiac Enzymes No results for input(s): "CKTOTAL", "CKMB", "CKMBINDEX", "TROPONINI" in the last 72 hours.  BNP: BNP (last 3 results) Recent Labs    10/10/21 1606 10/22/21 1623 10/24/21 2150  BNP 1,564.6* 1,226.7* 1,306.1*     ProBNP (last 3 results) No results for input(s): "PROBNP" in the last 8760 hours.   D-Dimer No results for input(s): "DDIMER" in the last 72 hours. Hemoglobin A1C No results for input(s): "HGBA1C" in the last 72 hours.  Fasting Lipid Panel No results for input(s): "CHOL", "HDL", "LDLCALC", "TRIG", "CHOLHDL", "LDLDIRECT" in the last 72 hours. Thyroid Function Tests No results for  input(s): "TSH", "T4TOTAL", "T3FREE", "THYROIDAB" in the last 72 hours.  Invalid input(s): "FREET3"  Other results:   Imaging    No results found.   Medications:     Scheduled Medications:  amiodarone  200 mg Oral BID   aspirin  81 mg Oral Daily   atorvastatin  80 mg Oral Daily   bisacodyl  10 mg Oral Daily   Or   bisacodyl  10 mg Rectal Daily   Chlorhexidine Gluconate Cloth  6 each Topical Daily   dapagliflozin propanediol  10 mg Oral Daily   digoxin  0.125 mg Oral Daily   docusate  200 mg Oral BID   escitalopram  10 mg Oral Daily   feeding supplement  237 mL Oral TID  WC & HS   furosemide  80 mg Intravenous BID   insulin aspart  0-24 Units Subcutaneous TID WC   insulin detemir  25 Units Subcutaneous Daily   latanoprost  1 drop Both Eyes QHS   lidocaine  2 patch Transdermal Q24H   mouth rinse  15 mL Mouth Rinse BID   midodrine  10 mg Oral TID WC   multivitamin  15 mL Oral Daily   pantoprazole  40 mg Oral BID AC   potassium chloride  40 mEq Oral Q4H   senna  2 tablet Oral QHS   sodium chloride flush  10-40 mL Intracatheter Q12H   sodium chloride flush  3 mL Intravenous Q12H   warfarin  1 mg Oral q1600   Warfarin - Physician Dosing Inpatient   Does not apply q1600    Infusions:  sodium chloride     lactated ringers     potassium chloride 10 mEq (11/17/21 0959)    PRN Medications: ondansetron (ZOFRAN) IV, oxyCODONE, sodium chloride flush, sodium chloride flush, traMADol    Patient Profile   75 y.o. male with hx COPD, prediabetes, HLD, GERD. Recent admit earlier this month with later presentation inferior STEMI and new systolic CHF w/ moderate to severe ischemic MR.   Readmitted with a/c CHF. Optimized from HF standpoint. Underwent CABG w MVR, MAZE and LAA clip 5/26.   Assessment/Plan   1. CAD/recent late presentation inferior STEMI - LHC 05/04: 100% m RCA and 90% m LAD.  - RCA managed medically d/t completed infarct.  - Echo (5/23) EF 25%, RV moderately reduced, moderate to severe MR - 5/26: S/p CABG x 2 (LIMA-LAD, SVG-Ramus INT)   - ASA + high intensity statin  - No s/s angina   2. Acute on chronic systolic CHF with low-output/ICM - s/p late presentation inferior STEMI 05/23 - Echo 05/23 EF 25-30%, RV moderately reduced, moderate to severe MR. - Limited echo on admit EF 30-35%, RV moderately reduced, MR not well evaluated, moderate pericardial effusion - S/p CABG + bioprosthetic MVR 5/26 - Now off NE and Milrinone  - Co-ox 61% this am.   - Good diuresis yesterday. Weight down 5 pounds. Now 2-3 pounds over pre-op. Continue IV lasix  and metolazone one more day.  - Continue  Farxiga 10 mg daily.  - Continue midodrine 10 mg tid, will not wean yet with soft BP and diuresing.   - Continue digoxin 0.125.   - Eventually add back GDMT as blood pressure allows/ Add back low-dose spiro today  3. Mitral regurgitation - Moderate to severe ischemic MR on TEE.  - S/p bioprosthetic MVR 11/02/21 - On warfarin per TCTS. INR 2.1 today   4. Aortic valve insufficiency - Mild to moderate  AS/AI - stable.    5. Pericardial effusion/Acute Pericarditis  - Moderate in size on echo, no respiratory variation or RV collapse to suggest tamponade - acute pericarditis/ adhensions noted at time of CABG  6. H/o AKI - Scr stable - Watch renal function closely, stable today at 1.19   7. PAF - s/p MAZE w/ CABG  - maintaining SR. On po amiodarone 200 mg BID. Hopefully can stop soon. - warfarin started, INR 2.1 today.  8. Hyponatremia  - Na 132 stable  - Fluid restrict. - c/w diuresis   9. Post-op ileus/esophageal stricture - also has h/o esophageal stricture (was unable to pass TEE probe transgastric prior to CABG) - Barium swallow with distal stricture. GI has seen. Appreciate recs. Outpatient EGD with dilation  10. Post-op anemia - Transfuse < 7.5.  - Hgb 8.6 today  12. Hypokalemia: - K 3.0 - Supp with diuresis. Add back low-dose spiro.   Continue to mobilize. PT recommending [facility].   Length of Stay: [place], MD  11/17/2021, 10:00 AM  Advanced Heart Failure Team Pager [phone] (M-F; 7a - 5p)  Please contact [facility] for night-coverage after hours (5p -7a ) and weekends on [email or web address]

## 2021-11-17 NOTE — Progress Notes (Signed)
16 Days Post-Op Procedure(s) (LRB): MITRAL VALVE (MV) REPLACEMENT USING MITRIS VALVE. (N/A) CORONARY ARTERY BYPASS GRAFTING (CABG) X TWO USING OPEN LEFT INTERNAL MAMMARY ARTERY AND ENDOSOPIC RIGHT GREATER SAPHENOUS VEIN HARVEST. (N/A) MAZE (N/A) TRANSESOPHAGEAL ECHOCARDIOGRAM (TEE) (N/A) CLIPPING OF ATRIAL APPENDAGE USING AN ATRICLIP PRO2 (N/A) Subjective: Feels "oK" this AM  Objective: Vital signs in last 24 hours: Temp:  [97.6 F (36.4 C)-98.2 F (36.8 C)] 98.2 F (36.8 C) (06/11 0731) Pulse Rate:  [70-84] 72 (06/11 0731) Cardiac Rhythm: Normal sinus rhythm (06/10 1900) Resp:  [17-21] 20 (06/11 0731) BP: (91-119)/(61-74) 105/69 (06/11 0856) SpO2:  [94 %-100 %] 94 % (06/11 0731)  Hemodynamic parameters for last 24 hours: CVP:  [7 mmHg-12 mmHg] 12 mmHg  Intake/Output from previous day: 06/10 0701 - 06/11 0700 In: 720 [P.O.:720] Out: 3700 [Urine:3700] Intake/Output this shift: No intake/output data recorded.  General appearance: alert, cooperative, and no distress Neurologic: intact Lungs: diminished breath sounds bibasilar Extremities: edema -  Lab Results: Recent Labs    11/16/21 0934 11/17/21 0418  WBC 13.5* 11.8*  HGB 8.8* 8.6*  HCT 26.9* 25.7*  PLT 349 306   BMET:  Recent Labs    11/16/21 0430 11/17/21 0418  NA 132* 132*  K 3.8 3.0*  CL 91* 89*  CO2 33* 34*  GLUCOSE 74 73  BUN 43* 38*  CREATININE 1.14 1.19  CALCIUM 8.1* 8.1*    PT/INR:  Recent Labs    11/17/21 0418  LABPROT 23.7*  INR 2.1*   ABG    Component Value Date/Time   PHART 7.300 (L) 11/02/2021 0151   HCO3 23.7 11/02/2021 0151   TCO2 25 11/02/2021 0151   ACIDBASEDEF 3.0 (H) 11/02/2021 0151   O2SAT 61 11/17/2021 0418   CBG (last 3)  Recent Labs    11/16/21 1625 11/16/21 2112 11/17/21 0650  GLUCAP 89 103* 81    Assessment/Plan: S/P Procedure(s) (LRB): MITRAL VALVE (MV) REPLACEMENT USING MITRIS VALVE. (N/A) CORONARY ARTERY BYPASS GRAFTING (CABG) X TWO  USING OPEN LEFT INTERNAL MAMMARY ARTERY AND ENDOSOPIC RIGHT GREATER SAPHENOUS VEIN HARVEST. (N/A) MAZE (N/A) TRANSESOPHAGEAL ECHOCARDIOGRAM (TEE) (N/A) CLIPPING OF ATRIAL APPENDAGE USING AN ATRICLIP PRO2 (N/A) - CV- s/p STEMI, CABG, MVR, Maze  BP a little better but still relatively low, on midodrine  Co=ox 61 off milrinone x 48 hours  In SR on amiodarone, digoxin  Coumadin- INR 2.1 RESP- sats Ok on room air RENAL- creatinine stable  Diuresed well yesterday  Hypokalemia - KCl runs in progress ENDO- CBG well controlled GI- tolerating POs Deconditioning- continue mobilizing as tolerated  LOS: 24 days    Loreli Slot 11/17/2021

## 2021-11-18 ENCOUNTER — Other Ambulatory Visit (HOSPITAL_COMMUNITY): Payer: Self-pay

## 2021-11-18 DIAGNOSIS — I5043 Acute on chronic combined systolic (congestive) and diastolic (congestive) heart failure: Secondary | ICD-10-CM | POA: Diagnosis not present

## 2021-11-18 LAB — PROTIME-INR
INR: 2.2 — ABNORMAL HIGH (ref 0.8–1.2)
Prothrombin Time: 24 seconds — ABNORMAL HIGH (ref 11.4–15.2)

## 2021-11-18 LAB — BASIC METABOLIC PANEL
Anion gap: 9 (ref 5–15)
BUN: 35 mg/dL — ABNORMAL HIGH (ref 8–23)
CO2: 34 mmol/L — ABNORMAL HIGH (ref 22–32)
Calcium: 8.2 mg/dL — ABNORMAL LOW (ref 8.9–10.3)
Chloride: 91 mmol/L — ABNORMAL LOW (ref 98–111)
Creatinine, Ser: 1.21 mg/dL (ref 0.61–1.24)
GFR, Estimated: >60 mL/min (ref >60)
Glucose, Bld: 98 mg/dL (ref 70–99)
Potassium: 3.2 mmol/L — ABNORMAL LOW (ref 3.5–5.1)
Sodium: 134 mmol/L — ABNORMAL LOW (ref 135–145)

## 2021-11-18 LAB — CBC
HCT: 26.9 % — ABNORMAL LOW (ref 39.0–52.0)
Hemoglobin: 8.8 g/dL — ABNORMAL LOW (ref 13.0–17.0)
MCH: 27.5 pg (ref 26.0–34.0)
MCHC: 32.7 g/dL (ref 30.0–36.0)
MCV: 84.1 fL (ref 80.0–100.0)
Platelets: 303 10*3/uL (ref 150–400)
RBC: 3.2 MIL/uL — ABNORMAL LOW (ref 4.22–5.81)
RDW: 15.5 % (ref 11.5–15.5)
WBC: 9.2 10*3/uL (ref 4.0–10.5)
nRBC: 0 % (ref 0.0–0.2)

## 2021-11-18 LAB — GLUCOSE, CAPILLARY
Glucose-Capillary: 178 mg/dL — ABNORMAL HIGH (ref 70–99)
Glucose-Capillary: 93 mg/dL (ref 70–99)
Glucose-Capillary: 186 mg/dL — ABNORMAL HIGH (ref 70–99)

## 2021-11-18 LAB — COOXEMETRY PANEL
Carboxyhemoglobin: 2.1 % — ABNORMAL HIGH (ref 0.5–1.5)
Methemoglobin: <0.7 % (ref 0.0–1.5)
O2 Saturation: 58.6 %
Total hemoglobin: 9.5 g/dL — ABNORMAL LOW (ref 12.0–16.0)

## 2021-11-18 MED ORDER — SPIRONOLACTONE 25 MG PO TABS
12.5000 mg | ORAL_TABLET | Freq: Every day | ORAL | 3 refills | Status: DC
  Filled 2021-11-18: qty 30, 60d supply, fill #0

## 2021-11-18 MED ORDER — AMIODARONE HCL 200 MG PO TABS
200.0000 mg | ORAL_TABLET | Freq: Every day | ORAL | Status: DC
  Administered 2021-11-18: 200 mg via ORAL
  Filled 2021-11-18: qty 1

## 2021-11-18 MED ORDER — POTASSIUM CHLORIDE CRYS ER 20 MEQ PO TBCR
40.0000 meq | EXTENDED_RELEASE_TABLET | Freq: Every day | ORAL | 3 refills | Status: DC
  Filled 2021-11-18: qty 30, 15d supply, fill #0

## 2021-11-18 MED ORDER — OXYCODONE HCL 5 MG PO TABS
5.0000 mg | ORAL_TABLET | ORAL | 0 refills | Status: DC | PRN
  Filled 2021-11-18: qty 30, 5d supply, fill #0

## 2021-11-18 MED ORDER — TORSEMIDE 20 MG PO TABS
40.0000 mg | ORAL_TABLET | Freq: Every day | ORAL | 3 refills | Status: DC
  Filled 2021-11-18 (×2): qty 60, 30d supply, fill #0

## 2021-11-18 MED ORDER — AMIODARONE HCL 200 MG PO TABS
200.0000 mg | ORAL_TABLET | Freq: Every day | ORAL | 1 refills | Status: DC
  Filled 2021-11-18: qty 30, 30d supply, fill #0

## 2021-11-18 MED ORDER — WARFARIN SODIUM 1 MG PO TABS
1.0000 mg | ORAL_TABLET | Freq: Every day | ORAL | 3 refills | Status: DC
  Filled 2021-11-18: qty 30, 30d supply, fill #0

## 2021-11-18 MED ORDER — POTASSIUM CHLORIDE CRYS ER 20 MEQ PO TBCR
40.0000 meq | EXTENDED_RELEASE_TABLET | Freq: Three times a day (TID) | ORAL | Status: DC
  Administered 2021-11-18: 40 meq via ORAL
  Filled 2021-11-18: qty 2

## 2021-11-18 MED ORDER — POTASSIUM CHLORIDE CRYS ER 20 MEQ PO TBCR: 40.0000 meq | EXTENDED_RELEASE_TABLET | Freq: Three times a day (TID) | ORAL | Status: DC

## 2021-11-18 MED ORDER — TORSEMIDE 20 MG PO TABS
10.0000 mg | ORAL_TABLET | Freq: Every day | ORAL | 3 refills | Status: DC
  Filled 2021-11-18: qty 30, 60d supply, fill #0

## 2021-11-18 MED ORDER — FUROSEMIDE 40 MG PO TABS
40.0000 mg | ORAL_TABLET | Freq: Two times a day (BID) | ORAL | Status: DC
  Administered 2021-11-18: 40 mg via ORAL
  Filled 2021-11-18: qty 1

## 2021-11-18 MED ORDER — MIDODRINE HCL 10 MG PO TABS
10.0000 mg | ORAL_TABLET | Freq: Three times a day (TID) | ORAL | 3 refills | Status: DC
  Filled 2021-11-18: qty 90, 30d supply, fill #0

## 2021-11-18 NOTE — Progress Notes (Signed)
      [address]       [place] [address]             [phone]      17 Days Post-Op Procedure(s) (LRB): MITRAL VALVE (MV) REPLACEMENT USING 27MM MITRIS VALVE. (N/A) CORONARY ARTERY BYPASS GRAFTING (CABG) X TWO USING OPEN LEFT INTERNAL MAMMARY ARTERY AND ENDOSOPIC RIGHT GREATER SAPHENOUS VEIN HARVEST. (N/A) MAZE (N/A) TRANSESOPHAGEAL ECHOCARDIOGRAM (TEE) (N/A) CLIPPING OF ATRIAL APPENDAGE USING AN ATRICLIP PRO2 45MM (N/A)  Subjective:  Patient has no new complaints.  He thinks his swelling is improving.  Objective: Vital signs in last 24 hours: Temp:  [97.6 F (36.4 C)-98.3 F (36.8 C)] 98.3 F (36.8 C) (06/12 0402) Pulse Rate:  [70-72] 72 (06/12 0402) Cardiac Rhythm: Normal sinus rhythm (06/12 0402) Resp:  [20-23] 22 (06/12 0402) BP: (98-110)/(57-69) 102/64 (06/12 0402) SpO2:  [94 %-99 %] 94 % (06/12 0402) Weight:  [92.2 kg-94.2 kg] 92.2 kg (06/12 0404)  Hemodynamic parameters for last 24 hours: CVP:  [7 mmHg-13 mmHg] 13 mmHg  Intake/Output from previous day: 06/11 0701 - 06/12 0700 In: 480 [P.O.:480] Out: 2850 [Urine:2850]  General appearance: alert, cooperative, and no distress Heart: regular rate and rhythm Lungs: clear to auscultation bilaterally Abdomen: soft, non-tender; bowel sounds normal; no masses,  no organomegaly Extremities: edema pitting, improving Wound: clean and dry, healing w/o evidence of infection  Lab Results: Recent Labs    11/16/21 0934 11/17/21 0418  WBC 13.5* 11.8*  HGB 8.8* 8.6*  HCT 26.9* 25.7*  PLT 349 306   BMET:  Recent Labs    11/17/21 1715 11/18/21 0551  NA 130* 134*  K 3.6 3.2*  CL 92* 91*  CO2 30 34*  GLUCOSE 140* 98  BUN 38* 35*  CREATININE 1.27* 1.21  CALCIUM 8.1* 8.2*    PT/INR:  Recent Labs    11/18/21 0551  LABPROT 24.0*  INR 2.2*   ABG    Component Value Date/Time   PHART 7.300 (L) 11/02/2021 0151   HCO3 23.7 11/02/2021 0151   TCO2 25 11/02/2021 0151   ACIDBASEDEF 3.0 (H)  11/02/2021 0151   O2SAT 58.6 11/18/2021 0551   CBG (last 3)  Recent Labs    11/17/21 1530 11/17/21 2102 11/18/21 0626  GLUCAP 98 88 93    Assessment/Plan: S/P Procedure(s) (LRB): MITRAL VALVE (MV) REPLACEMENT USING 27MM MITRIS VALVE. (N/A) CORONARY ARTERY BYPASS GRAFTING (CABG) X TWO USING OPEN LEFT INTERNAL MAMMARY ARTERY AND ENDOSOPIC RIGHT GREATER SAPHENOUS VEIN HARVEST. (N/A) MAZE (N/A) TRANSESOPHAGEAL ECHOCARDIOGRAM (TEE) (N/A) CLIPPING OF ATRIAL APPENDAGE USING AN ATRICLIP PRO2 45MM (N/A)  CV- hemodynamically stable in NSR- on Amiodarone, Digoxin, hypotension on Midodrine, Farxiga and Spironolactone have been started INR 2.2, continue coumadin at 1 mg daily Renal- creatinine remains stable, diuresed well over the weekend, diuretics per AHF Hypokalemia- due to aggressive diuresis, will increase supplementation today Deconditioning- home PT,RN orders placed Dispo- patient stable, making progress, AHF is optimizing medication as tolerated, diuresing well, will supplement potassium, home once medically optimized and volume status is stable   LOS: 25 days    Ellwood Handler, PA-C 11/18/2021

## 2021-11-18 NOTE — Progress Notes (Signed)
Pt getting up with PT currently. Reviewed sternal precautions, daily wts, low sodium, walking and the importance of CRPII. Pt voiced understanding.  [id number] Keith Jenkins CES, ACSM 11/18/2021 9:11 AM

## 2021-11-18 NOTE — Progress Notes (Signed)
PICC Removal Note: Small red bruise noted distal to insertion site. Otherwise unremarkable. PICC line removed from RUE per protocol. PICC catheter tip visualized and intact. Pressure dressing applied with transparent tape. No redness, ecchymosis, edema, swelling, or drainage noted at site. Instructions provided on post PICC discharge care, including followup notification instructions.

## 2021-11-18 NOTE — Consult Note (Signed)
   [facility] CM Inpatient Consult   11/18/2021  Babacar Haycraft 19-Mar-1946 [id number]  [facility] [[facility]]  Accountable Care Organization [ACO] Patient: Keith Jenkins  Primary Care Provider:  [name], NP, at [facility], is an embedded provider with a Chronic Care Management [CCM] team and program, and is listed for the transition of care follow up and appointments.  Patient was screened for Embedded practice service needs for chronic care management vs care coordination.  Review as patient will be followed at [facility] and for outpatient cardiac rehab.  Plan: No further TOC for post hospital needs assessed at this time for CCM.  Please contact for further questions,  Charlesetta Shanks, RN BSN CCM [facility]  [phone] business mobile phone Toll free office [phone]  Fax number: [phone] [email or web address]

## 2021-11-18 NOTE — Progress Notes (Signed)
Patient given discharge instructions and stated understanding. 

## 2021-11-18 NOTE — Plan of Care (Signed)
  Problem: Education: Goal: Ability to demonstrate management of disease process will improve Outcome: Adequate for Discharge Goal: Ability to verbalize understanding of medication therapies will improve Outcome: Adequate for Discharge Goal: Individualized Educational Video(s) Outcome: Adequate for Discharge   Problem: Activity: Goal: Capacity to carry out activities will improve Outcome: Adequate for Discharge   Problem: Cardiac: Goal: Ability to achieve and maintain adequate cardiopulmonary perfusion will improve Outcome: Adequate for Discharge   Problem: Increased Nutrient Needs (NI-5.1) Goal: Food and/or nutrient delivery Description: Individualized approach for food/nutrient provision. Outcome: Adequate for Discharge   Problem: Acute Rehab PT Goals(only PT should resolve) Goal: Patient Will Transfer Sit To/From Stand Outcome: Adequate for Discharge Goal: Pt Will Transfer Bed To Chair/Chair To Bed Outcome: Adequate for Discharge Goal: Pt Will Perform Standing Balance Or Pre-Gait Outcome: Adequate for Discharge Goal: Pt Will Ambulate Outcome: Adequate for Discharge Goal: Pt Will Go Up/Down Stairs Outcome: Adequate for Discharge   Problem: Acute Rehab OT Goals (only OT should resolve) Goal: Pt. Will Perform Upper Body Dressing Outcome: Adequate for Discharge Goal: Pt. Will Perform Lower Body Dressing Outcome: Adequate for Discharge Goal: Pt. Will Transfer To Toilet Outcome: Adequate for Discharge Goal: Pt. Will Perform Toileting-Clothing Manipulation Outcome: Adequate for Discharge   Problem: Education: Goal: Will demonstrate proper wound care and an understanding of methods to prevent future damage Outcome: Adequate for Discharge Goal: Knowledge of disease or condition will improve Outcome: Adequate for Discharge Goal: Knowledge of the prescribed therapeutic regimen will improve Outcome: Adequate for Discharge Goal: Individualized Educational Video(s) Outcome:  Adequate for Discharge   Problem: Activity: Goal: Risk for activity intolerance will decrease Outcome: Adequate for Discharge   Problem: Cardiac: Goal: Will achieve and/or maintain hemodynamic stability Outcome: Adequate for Discharge   Problem: Clinical Measurements: Goal: Postoperative complications will be avoided or minimized Outcome: Adequate for Discharge   Problem: Respiratory: Goal: Respiratory status will improve Outcome: Adequate for Discharge   Problem: Skin Integrity: Goal: Wound healing without signs and symptoms of infection Outcome: Adequate for Discharge Goal: Risk for impaired skin integrity will decrease Outcome: Adequate for Discharge   Problem: Urinary Elimination: Goal: Ability to achieve and maintain adequate renal perfusion and functioning will improve Outcome: Adequate for Discharge

## 2021-11-18 NOTE — Progress Notes (Signed)
Physical Therapy Treatment Patient Details Name: Keith Jenkins MRN: [id number] DOB: [birth year] Today's Date: 11/18/2021   History of Present Illness Pt is a 76 y.o. male admitted 10/24/21 witih SOB and chest pain after recent STEMI. Workup for CHF, aflutter. S/p CABG, MVR 5/26. PMH includes pre-DM, HLD, arthritis, glaucoma.    PT Comments    Patient progressing well towards PT goals. Session focused on stair training and functional mobility. Tolerated stair training with Min A and use of rail for support. Required seated rest break post stairs due to fatigue. VSS on RA throughout session. HR ranging from 70-99 bpm and SP02 98% with activity. Reviewed sternal precautions and exercise recommendations at home. Encouraged wife to assist with stairs to enter home as well as using SPC. Recommend minimizing stair negotiation to second level daily until strength/mobility improve. Pt plans to d/c home today with support of family. Will follow if still in the hospital.    Recommendations for follow up therapy are one component of a multi-disciplinary discharge planning process, led by the attending physician.  Recommendations may be updated based on patient status, additional functional criteria and insurance authorization.  Follow Up Recommendations  Home health PT     Assistance Recommended at Discharge Intermittent Supervision/Assistance  Patient can return home with the following A little help with bathing/dressing/bathroom;Assistance with cooking/housework;Assist for transportation;Help with stairs or ramp for entrance   Equipment Recommendations  None recommended by PT    Recommendations for Other Services       Precautions / Restrictions Precautions Precautions: Fall;Sternal Precaution Booklet Issued: No Precaution Comments: Reviewed sternal precautions Restrictions Weight Bearing Restrictions: Yes Other Position/Activity Restrictions: Sternal precautions     Mobility  Bed  Mobility Overal bed mobility: Needs Assistance Bed Mobility: Rolling, Sidelying to Sit Rolling: Modified independent (Device/Increase time) Sidelying to sit: Modified independent (Device/Increase time)   Sit to supine: Modified independent (Device/Increase time)   General bed mobility comments: cues for log roll technique, pt prefers to power up to sitting from flat bed, though able to do so while maintaining sternal precautions, increased time.    Transfers Overall transfer level: Needs assistance Equipment used: Rollator (4 wheels) Transfers: Sit to/from Stand Sit to Stand: Supervision           General transfer comment: Supervision for safety. Stood from [employer], from rollator seat x1. Maintaining precautions with cues at times.    Ambulation/Gait Ambulation/Gait assistance: Supervision, Modified independent (Device/Increase time) Gait Distance (Feet): 300 Feet Assistive device: Rollator (4 wheels) Gait Pattern/deviations: Step-through pattern, Decreased stride length, Trunk flexed Gait velocity: Decreased Gait velocity interpretation: <1.31 ft/sec, indicative of household ambulator   General Gait Details: slow, fatigued gait with rollator and supervision; 1 seated rest break post stairs. VSS on RA. Sp02 >98% on RA, HR ranging 70-99 bpm; no SOB noted   Stairs Stairs: Yes Stairs assistance: Min assist Stair Management: Step to pattern, Forwards, One rail Left   General stair comments: Ascended/descended stairs using rail on LUE to simulate using SPC and then HHA on right, cues for technique.   Wheelchair Mobility    Modified Rankin (Stroke Patients Only)       Balance Overall balance assessment: Needs assistance Sitting-balance support: Feet supported, No upper extremity supported Sitting balance-Leahy Scale: Good Sitting balance - Comments: pt with good sitting balance   Standing balance support: During functional activity Standing balance-Leahy Scale:  Fair Standing balance comment: can static stand and take steps without UE support, min guard  Cognition Arousal/Alertness: Awake/alert Behavior During Therapy: WFL for tasks assessed/performed, Flat affect Overall Cognitive Status: Within Functional Limits for tasks assessed                                 General Comments: WFL for simple tasks; prefers increased time to complete tasks        Exercises      General Comments General comments (skin integrity, edema, etc.): VSS on RA.      Pertinent Vitals/Pain Pain Assessment Pain Assessment: No/denies pain    Home Living                          Prior Function            PT Goals (current goals can now be found in the care plan section) Acute Rehab PT Goals Patient Stated Goal: to go home PT Goal Formulation: With patient Time For Goal Achievement: 12/02/21 Potential to Achieve Goals: Good Progress towards PT goals: Progressing toward goals    Frequency    Min 3X/week      PT Plan Current plan remains appropriate    Co-evaluation              AM-PAC PT "6 Clicks" Mobility   Outcome Measure  Help needed turning from your back to your side while in a flat bed without using bedrails?: None Help needed moving from lying on your back to sitting on the side of a flat bed without using bedrails?: None Help needed moving to and from a bed to a chair (including a wheelchair)?: A Little Help needed standing up from a chair using your arms (e.g., wheelchair or bedside chair)?: A Little Help needed to walk in hospital room?: A Little Help needed climbing 3-5 steps with a railing? : A Little 6 Click Score: 20    End of Session Equipment Utilized During Treatment: Gait belt Activity Tolerance: Patient tolerated treatment well Patient left: in bed;with call bell/phone within reach Nurse Communication: Mobility status PT Visit Diagnosis: Other  abnormalities of gait and mobility (R26.89);Muscle weakness (generalized) (M62.81);Unsteadiness on feet (R26.81);Difficulty in walking, not elsewhere classified (R26.2)     Time: [id number] PT Time Calculation (min) (ACUTE ONLY): 36 min  Charges:  $Gait Training: 23-37 mins                     Marisa Severin, PT, DPT Acute Rehabilitation Services Secure chat preferred Office Hand 11/18/2021, 9:41 AM

## 2021-11-18 NOTE — Progress Notes (Addendum)
Patient ID: Keith Jenkins, male   DOB: 21-Dec-1945, 76 y.o.   MRN: [id number]     Advanced Heart Failure Rounding Note  PCP-Cardiologist: None   Subjective:    05/19: Admit with a/c CHF. CO-OX okay but appeared low-output >>started milrinone 0.25 05/22: Developed A flutter. Started on amio drip.  05/26: S/p CABG x2 (LIMA-LAD, SVG-RAMUS INT) +MVR, MAZE, LAA Clip  6/1: Barium swallow with distal esophageal stricture. GI consult -> ok to continue po (outpatient f/u for EGD/dilation) 6/4: Midodrine increased to 10 mg tid. Diuresed with IV lasix. Norepi stopped.  6/5: Given IV lasix + tolvaptan.  6/7: Milrinone stopped.   CO-OX 59%   Yesterday diuresed with IV lasix + metolazone. Weight down another 4 pounds.   Feels ok. Denies SOB    Objective:   Weight Range: 92.2 kg Body mass index is 26.82 kg/m.   Vital Signs:   Temp:  [97.6 F (36.4 C)-98.3 F (36.8 C)] 98.3 F (36.8 C) (06/12 0402) Pulse Rate:  [70-72] 72 (06/12 0402) Resp:  [20-23] 22 (06/12 0402) BP: (98-110)/(57-69) 102/64 (06/12 0402) SpO2:  [94 %-99 %] 94 % (06/12 0402) Weight:  [92.2 kg-94.2 kg] 92.2 kg (06/12 0404) Last BM Date : 11/18/21  Weight change: Filed Weights   11/16/21 0546 11/17/21 0900 11/18/21 0404  Weight: 96.2 kg 94.2 kg 92.2 kg    Intake/Output:   Intake/Output Summary (Last 24 hours) at 11/18/2021 0748 Last data filed at 11/17/2021 2350 Gross per 24 hour  Intake 480 ml  Output 2850 ml  Net -2370 ml      Physical Exam  CVP 4 in bed  General:   No resp difficulty HEENT: normal Neck: supple. no JVD. Carotids 2+ bilat; no bruits. No lymphadenopathy or thryomegaly appreciated. Cor: PMI nondisplaced. Regular rate & rhythm. No rubs, gallops or murmurs. Lungs: clear Abdomen: soft, nontender, nondistended. No hepatosplenomegaly. No bruits or masses. Good bowel sounds. Extremities: no cyanosis, clubbing, rash, edema. RUE PICC  Neuro: alert & orientedx3, cranial nerves grossly intact. moves  all 4 extremities w/o difficulty. Affect pleasant   Telemetry   SR 70s personally checked.   Labs    CBC Recent Labs    11/16/21 0934 11/17/21 0418  WBC 13.5* 11.8*  HGB 8.8* 8.6*  HCT 26.9* 25.7*  MCV 85.1 84.0  PLT 349 [id number]   Basic Metabolic Panel Recent Labs    11/17/21 0418 11/17/21 1715 11/18/21 0551  NA 132* 130* 134*  K 3.0* 3.6 3.2*  CL 89* 92* 91*  CO2 34* 30 34*  GLUCOSE 73 140* 98  BUN 38* 38* 35*  CREATININE 1.19 1.27* 1.21  CALCIUM 8.1* 8.1* 8.2*  MG 2.1  --   --    Liver Function Tests No results for input(s): "AST", "ALT", "ALKPHOS", "BILITOT", "PROT", "ALBUMIN" in the last 72 hours.   No results for input(s): "LIPASE", "AMYLASE" in the last 72 hours. Cardiac Enzymes No results for input(s): "CKTOTAL", "CKMB", "CKMBINDEX", "TROPONINI" in the last 72 hours.  BNP: BNP (last 3 results) Recent Labs    10/10/21 1606 10/22/21 1623 10/24/21 2150  BNP 1,564.6* 1,226.7* 1,306.1*    ProBNP (last 3 results) No results for input(s): "PROBNP" in the last 8760 hours.   D-Dimer No results for input(s): "DDIMER" in the last 72 hours. Hemoglobin A1C No results for input(s): "HGBA1C" in the last 72 hours.  Fasting Lipid Panel No results for input(s): "CHOL", "HDL", "LDLCALC", "TRIG", "CHOLHDL", "LDLDIRECT" in the last 72 hours. Thyroid Function Tests  No results for input(s): "TSH", "T4TOTAL", "T3FREE", "THYROIDAB" in the last 72 hours.  Invalid input(s): "FREET3"  Other results:   Imaging    No results found.   Medications:     Scheduled Medications:  amiodarone  200 mg Oral BID   aspirin  81 mg Oral Daily   atorvastatin  80 mg Oral Daily   bisacodyl  10 mg Oral Daily   Or   bisacodyl  10 mg Rectal Daily   Chlorhexidine Gluconate Cloth  6 each Topical Daily   dapagliflozin propanediol  10 mg Oral Daily   digoxin  0.125 mg Oral Daily   docusate  200 mg Oral BID   escitalopram  10 mg Oral Daily   feeding supplement  237 mL Oral  TID WC & HS   furosemide  80 mg Intravenous BID   insulin aspart  0-24 Units Subcutaneous TID WC   insulin detemir  25 Units Subcutaneous Daily   latanoprost  1 drop Both Eyes QHS   lidocaine  2 patch Transdermal Q24H   mouth rinse  15 mL Mouth Rinse BID   midodrine  10 mg Oral TID WC   multivitamin  15 mL Oral Daily   pantoprazole  40 mg Oral BID AC   potassium chloride  40 mEq Oral TID   senna  2 tablet Oral QHS   sodium chloride flush  10-40 mL Intracatheter Q12H   sodium chloride flush  3 mL Intravenous Q12H   spironolactone  12.5 mg Oral Daily   warfarin  1 mg Oral q1600   Warfarin - Physician Dosing Inpatient   Does not apply q1600    Infusions:  sodium chloride     lactated ringers      PRN Medications: ondansetron (ZOFRAN) IV, oxyCODONE, sodium chloride flush, sodium chloride flush, traMADol    Patient Profile   76 y.o. male with hx COPD, prediabetes, HLD, GERD. Recent admit earlier this month with later presentation inferior STEMI and new systolic CHF w/ moderate to severe ischemic MR.   Readmitted with a/c CHF. Optimized from HF standpoint. Underwent CABG w MVR, MAZE and LAA clip 5/26.   Assessment/Plan   1. CAD/recent late presentation inferior STEMI - LHC 05/04: 100% m RCA and 90% m LAD.  - RCA managed medically d/t completed infarct.  - Echo (5/23) EF 25%, RV moderately reduced, moderate to severe MR - 5/26: S/p CABG x 2 (LIMA-LAD, SVG-Ramus INT)   - ASA + high intensity statin  - No s/s angina   2. Acute on chronic systolic CHF with low-output/ICM - s/p late presentation inferior STEMI 05/23 - Echo 05/23 EF 25-30%, RV moderately reduced, moderate to severe MR. - Limited echo on admit EF 30-35%, RV moderately reduced, MR not well evaluated, moderate pericardial effusion - S/p CABG + bioprosthetic MVR 5/26 - Now off NE and Milrinone . Co-ox 59% this am.   -  Weight down another 4 pounds. CVP 4-5 . Stop IV lasix. Start po lasix 40 mg twice a day.  -  Continue  Farxiga 10 mg daily.  - Continue midodrine 10 mg tid, will not wean yet with soft BP and diuresing.  Wean as an outpatient.  - No room for arb/bb while on midodrine.  - Continue digoxin 0.125.   - Continue spiro 12.5 mg daily.  - Eventually add back GDMT as blood pressure allows/ Add back low-dose spiro today  3. Mitral regurgitation - Moderate to severe ischemic MR on TEE.  -  S/p bioprosthetic MVR 11/02/21 - On warfarin per [facility]. INR 2.2 today   4. Aortic valve insufficiency - Mild to moderate AS/AI - stable.    5. Pericardial effusion/Acute Pericarditis  - Moderate in size on echo, no respiratory variation or RV collapse to suggest tamponade - acute pericarditis/ adhensions noted at time of CABG  6. H/o AKI - Scr stable - Watch renal function closely, stable today at 1.2   7. PAF - s/p MAZE w/ CABG  - maintaining SR. Cut back to amio 200 mg daily.  - warfarin started, INR 2.2 today.  8. Hyponatremia  - Na 134 stable  - Fluid restrict. - c/w diuresis   9. Post-op ileus/esophageal stricture - also has h/o esophageal stricture (was unable to pass TEE probe transgastric prior to CABG) - Barium swallow with distal stricture. GI has seen. Appreciate recs. Outpatient EGD with dilation  10. Post-op anemia - Transfuse < 7.5.  - CBC pending.   12. Hypokalemia: - K 3.2. K Supp. Stop IV diuresis as noted above.  - Continue spiro to 12.5 mg daily.   HF Meds for D/C  Spironolactone 12.5 mg daily Digoxin 0.125 mg daily Torsemide 40 daily K Dur 40 meq daily.  Amio 200 mg daily  Farxiga 10 mg daily  Midodrine 10 mg three times a day  Atorvastatin 80 mg daily ASA 81 mg daily   HF F/U   Length of Stay: 25  Amy Clegg, NP  11/18/2021, 7:48 AM  Advanced Heart Failure Team Pager [phone] (M-F; 7a - 5p)  Please contact [facility] for night-coverage after hours (5p -7a ) and weekends on [email or web address]  Patient seen and examined with the above-signed Advanced  Practice Provider and/or Housestaff. I personally reviewed laboratory data, imaging studies and relevant notes. I independently examined the patient and formulated the important aspects of the plan. I have edited the note to reflect any of my changes or salient points. I have personally discussed the plan with the patient and/or family.  Good diuresis. Feeling better. Scr stable. K low.   General:  Sitting up in bed. No resp difficulty HEENT: normal Neck: supple. no JVD. Carotids 2+ bilat; no bruits. No lymphadenopathy or thryomegaly appreciated. Cor: PMI nondisplaced. Regular rate & rhythm. No rubs, gallops or murmurs. Lungs: clear Abdomen: soft, nontender, nondistended. No hepatosplenomegaly. No bruits or masses. Good bowel sounds. Extremities: no cyanosis, clubbing, rash, tr edema Neuro: alert & orientedx3, cranial nerves grossly intact. moves all 4 extremities w/o difficulty. Affect pleasant  Doing well. Edon for d/c on above meds. Be careful not to overdiurese.   F/u in HF Clinic 1 week.  Glori Bickers, MD  8:54 AM

## 2021-11-19 ENCOUNTER — Telehealth: Payer: Self-pay

## 2021-11-19 NOTE — TOC CM/SW Note (Signed)
HF TOC CM notified [facility] rep of scheduled dc home today with HH. Orders in Epic. Pt has DME. Isidoro Donning RN3 CCM, Heart Failure TOC CM [phone]

## 2021-11-19 NOTE — Telephone Encounter (Signed)
  Transition Care Management Follow-up Telephone Call    Keith Jenkins 01/11/1946  Admit Date: 10/24/2021 Discharge Date: 11/18/2021 Discharged from where: [facility]  Diagnoses: NSTEMI I21.4, S/P Mitral Valve Replacement Z95.2, S/P CABG x 2 Z95.1  2 day post discharge: 11/20/2021 7 day post discharge: 11/25/2021 14 day post discharge: 12/02/2021  Keith Jenkins was discharged from [facility] on 11/18/2021 with the diagnoses listed above. He was contacted today via telephone in regards to transition of care.     Discharge Instructions: See discharge summary  Items Reviewed: Did the pt receive and understand the discharge instructions provided? Yes  Medications obtained and verified? Yes  Any new allergies since your discharge? No  Dietary orders reviewed? No Do you have support at home? Yes     Functional Questionnaire: (I = Independent and D = Dependent) ADLs:   Bathing/Dressing- I  Meal Prep- I  Eating- I  Maintaining continence- I  Transferring/Ambulation- I  Managing Meds- I  Any patient concerns? no  Follow up appointments reviewed: PCP Hospital f/u appt confirmed? Yes  Scheduled to see Flonnie Hailstone, NP on June 26th @ 14:00. Specialist Hospital f/u appt confirmed? Yes  Scheduled to see Dr. Laneta Simmers on June 29th @ [facility]. Are transportation arrangements needed? No  If their condition worsens, is the pt aware to call PCP or go to the Emergency Dept.? Yes Was the patient provided with contact information for the PCP's office/after hours number? Yes Was to pt encouraged to call back with questions or concerns? Yes    11/19/21 2:24 PM

## 2021-11-20 ENCOUNTER — Telehealth: Payer: Self-pay | Admitting: *Deleted

## 2021-11-20 ENCOUNTER — Ambulatory Visit: Payer: Medicare HMO | Admitting: Nurse Practitioner

## 2021-11-20 NOTE — Telephone Encounter (Addendum)
Called pt since he has been discharged from the hospital and needs to have INR checked per staff message. Wife answered and stated pt was asleep, she is on the DPR so spoke with her about setting an appt. She states the pt will receive [facility] as well and they ar planning to come Friday. Advised will call to see if able to get an order for INR if possible but if not, he will have to come to the office. She went over his med list and has been taking all meds per d/c summary. Also, stressed importance of having INR monitored closely and explained the reasoning and she verbalized understanding.   Will call [facility] to see if able to get an INR.   Spoke with Josiah Lobo , Clinical Manager, with [facility]. He will place an order for INR to be done on start of care on Friday, June 16th.   Dr. Gala Romney, s/p Mitral Valve Replacement, Amio 200mg  qd, ASA 81mg , Warfarin 1mg   5/31-2.5mg  6/1-2.5mg  6/2-2.5mg  6/3-2.5mg  6/4-2.5mg  6/5-1mg  6/6-1mg  6/7-1mg  6/8-1mg  6/9-1mg  6/10-1mg  6/11-1mg  and d/c on 1mg ; wife confirmed pt is taking as directed

## 2021-11-22 ENCOUNTER — Telehealth (HOSPITAL_COMMUNITY): Payer: Self-pay

## 2021-11-22 ENCOUNTER — Telehealth: Payer: Self-pay | Admitting: *Deleted

## 2021-11-22 DIAGNOSIS — Z7982 Long term (current) use of aspirin: Secondary | ICD-10-CM | POA: Diagnosis not present

## 2021-11-22 DIAGNOSIS — Z7901 Long term (current) use of anticoagulants: Secondary | ICD-10-CM | POA: Diagnosis not present

## 2021-11-22 DIAGNOSIS — I5043 Acute on chronic combined systolic (congestive) and diastolic (congestive) heart failure: Secondary | ICD-10-CM | POA: Diagnosis not present

## 2021-11-22 DIAGNOSIS — I480 Paroxysmal atrial fibrillation: Secondary | ICD-10-CM | POA: Diagnosis not present

## 2021-11-22 DIAGNOSIS — I340 Nonrheumatic mitral (valve) insufficiency: Secondary | ICD-10-CM | POA: Diagnosis not present

## 2021-11-22 DIAGNOSIS — K219 Gastro-esophageal reflux disease without esophagitis: Secondary | ICD-10-CM | POA: Diagnosis not present

## 2021-11-22 DIAGNOSIS — E785 Hyperlipidemia, unspecified: Secondary | ICD-10-CM | POA: Diagnosis not present

## 2021-11-22 DIAGNOSIS — I252 Old myocardial infarction: Secondary | ICD-10-CM | POA: Diagnosis not present

## 2021-11-22 DIAGNOSIS — I110 Hypertensive heart disease with heart failure: Secondary | ICD-10-CM | POA: Diagnosis not present

## 2021-11-22 DIAGNOSIS — I351 Nonrheumatic aortic (valve) insufficiency: Secondary | ICD-10-CM | POA: Diagnosis not present

## 2021-11-22 DIAGNOSIS — R7303 Prediabetes: Secondary | ICD-10-CM | POA: Diagnosis not present

## 2021-11-22 DIAGNOSIS — M1990 Unspecified osteoarthritis, unspecified site: Secondary | ICD-10-CM | POA: Diagnosis not present

## 2021-11-22 DIAGNOSIS — Z7951 Long term (current) use of inhaled steroids: Secondary | ICD-10-CM | POA: Diagnosis not present

## 2021-11-22 DIAGNOSIS — R1314 Dysphagia, pharyngoesophageal phase: Secondary | ICD-10-CM | POA: Diagnosis not present

## 2021-11-22 DIAGNOSIS — Z7984 Long term (current) use of oral hypoglycemic drugs: Secondary | ICD-10-CM | POA: Diagnosis not present

## 2021-11-22 DIAGNOSIS — K9189 Other postprocedural complications and disorders of digestive system: Secondary | ICD-10-CM | POA: Diagnosis not present

## 2021-11-22 DIAGNOSIS — J449 Chronic obstructive pulmonary disease, unspecified: Secondary | ICD-10-CM | POA: Diagnosis not present

## 2021-11-22 DIAGNOSIS — Z952 Presence of prosthetic heart valve: Secondary | ICD-10-CM | POA: Diagnosis not present

## 2021-11-22 DIAGNOSIS — E871 Hypo-osmolality and hyponatremia: Secondary | ICD-10-CM | POA: Diagnosis not present

## 2021-11-22 DIAGNOSIS — L89152 Pressure ulcer of sacral region, stage 2: Secondary | ICD-10-CM | POA: Diagnosis not present

## 2021-11-22 DIAGNOSIS — I2510 Atherosclerotic heart disease of native coronary artery without angina pectoris: Secondary | ICD-10-CM | POA: Diagnosis not present

## 2021-11-22 DIAGNOSIS — D62 Acute posthemorrhagic anemia: Secondary | ICD-10-CM | POA: Diagnosis not present

## 2021-11-22 DIAGNOSIS — I3139 Other pericardial effusion (noninflammatory): Secondary | ICD-10-CM | POA: Diagnosis not present

## 2021-11-22 DIAGNOSIS — H409 Unspecified glaucoma: Secondary | ICD-10-CM | POA: Diagnosis not present

## 2021-11-22 DIAGNOSIS — I4892 Unspecified atrial flutter: Secondary | ICD-10-CM | POA: Diagnosis not present

## 2021-11-22 DIAGNOSIS — Z48812 Encounter for surgical aftercare following surgery on the circulatory system: Secondary | ICD-10-CM | POA: Diagnosis not present

## 2021-11-22 NOTE — Telephone Encounter (Signed)
Per phase I cardiac rehab fax cardiac rehab referral to [facility] cardiac rehab.

## 2021-11-22 NOTE — Telephone Encounter (Addendum)
Received a call from [place] from [facility] and he stated the Clinician that was going to see the pt called and stated the pt/wife told them they were going to get labs drawn so the Clinician did not need to do the labs. Arlys John reached out to [place] because he is aware that we need an INR today since the pt lives far away and has not had INR monitored since discharge from the hospital. He states his Clinician is planning to see the pt today for start of care but did not pack the lab stuff due to this message from the pt so they would not be able to obtain. He stated he reached out to the pt/family but there was no answer. Therefore, he called [place] to see if we had a lab appt for the pt and advised we don't as we were planning to get INR from them when they went out today. He states he will update [place] once he finds out anything else. Will await an update and reach out to the pt/wife as well.   Pt needs an INR.  Received a message from Ethel Rana, RN with [facility] stating: a family member contacted their office regarding Mr. Rodden's PT/INR and that the pt was going to [facility] to have patient's labs drawn and needed orders. Advised via the secure chat that initial visits we prefer to have done in the office or with home health, she later found out the pt was going to [facility] and just need a diagnosis code that she provided to have it drawn there. Will look out for that and educate pt & wife regarding standard procedure for future.   Also, called and left a message on voicemail for Josiah Lobo with Home Health to ask if they could do any warfarin education. Will await an update.  At 1150am, received a call back frfom Arlys John and he states they can educate the pt on warfarin with visit today.  At 508pm, there was no result on the pt in the chart from [facility] and per call has not been resulted if was done. Will check on results on Monday when office reopens.

## 2021-11-25 DIAGNOSIS — I252 Old myocardial infarction: Secondary | ICD-10-CM | POA: Diagnosis not present

## 2021-11-25 DIAGNOSIS — M1990 Unspecified osteoarthritis, unspecified site: Secondary | ICD-10-CM | POA: Diagnosis not present

## 2021-11-25 DIAGNOSIS — I2510 Atherosclerotic heart disease of native coronary artery without angina pectoris: Secondary | ICD-10-CM | POA: Diagnosis not present

## 2021-11-25 DIAGNOSIS — I5043 Acute on chronic combined systolic (congestive) and diastolic (congestive) heart failure: Secondary | ICD-10-CM | POA: Diagnosis not present

## 2021-11-25 DIAGNOSIS — Z7951 Long term (current) use of inhaled steroids: Secondary | ICD-10-CM | POA: Diagnosis not present

## 2021-11-25 DIAGNOSIS — Z7984 Long term (current) use of oral hypoglycemic drugs: Secondary | ICD-10-CM | POA: Diagnosis not present

## 2021-11-25 DIAGNOSIS — L89152 Pressure ulcer of sacral region, stage 2: Secondary | ICD-10-CM | POA: Diagnosis not present

## 2021-11-25 DIAGNOSIS — R7303 Prediabetes: Secondary | ICD-10-CM | POA: Diagnosis not present

## 2021-11-25 DIAGNOSIS — Z7901 Long term (current) use of anticoagulants: Secondary | ICD-10-CM | POA: Diagnosis not present

## 2021-11-25 DIAGNOSIS — Z48812 Encounter for surgical aftercare following surgery on the circulatory system: Secondary | ICD-10-CM | POA: Diagnosis not present

## 2021-11-25 DIAGNOSIS — E871 Hypo-osmolality and hyponatremia: Secondary | ICD-10-CM | POA: Diagnosis not present

## 2021-11-25 DIAGNOSIS — D62 Acute posthemorrhagic anemia: Secondary | ICD-10-CM | POA: Diagnosis not present

## 2021-11-25 DIAGNOSIS — E785 Hyperlipidemia, unspecified: Secondary | ICD-10-CM | POA: Diagnosis not present

## 2021-11-25 DIAGNOSIS — I3139 Other pericardial effusion (noninflammatory): Secondary | ICD-10-CM | POA: Diagnosis not present

## 2021-11-25 DIAGNOSIS — H409 Unspecified glaucoma: Secondary | ICD-10-CM | POA: Diagnosis not present

## 2021-11-25 DIAGNOSIS — I351 Nonrheumatic aortic (valve) insufficiency: Secondary | ICD-10-CM | POA: Diagnosis not present

## 2021-11-25 DIAGNOSIS — I480 Paroxysmal atrial fibrillation: Secondary | ICD-10-CM | POA: Diagnosis not present

## 2021-11-25 DIAGNOSIS — R1314 Dysphagia, pharyngoesophageal phase: Secondary | ICD-10-CM | POA: Diagnosis not present

## 2021-11-25 DIAGNOSIS — I4892 Unspecified atrial flutter: Secondary | ICD-10-CM | POA: Diagnosis not present

## 2021-11-25 DIAGNOSIS — K9189 Other postprocedural complications and disorders of digestive system: Secondary | ICD-10-CM | POA: Diagnosis not present

## 2021-11-25 DIAGNOSIS — K219 Gastro-esophageal reflux disease without esophagitis: Secondary | ICD-10-CM | POA: Diagnosis not present

## 2021-11-25 DIAGNOSIS — Z7982 Long term (current) use of aspirin: Secondary | ICD-10-CM | POA: Diagnosis not present

## 2021-11-25 DIAGNOSIS — I110 Hypertensive heart disease with heart failure: Secondary | ICD-10-CM | POA: Diagnosis not present

## 2021-11-25 DIAGNOSIS — J449 Chronic obstructive pulmonary disease, unspecified: Secondary | ICD-10-CM | POA: Diagnosis not present

## 2021-11-25 NOTE — Telephone Encounter (Signed)
Called pt/wife in reference to an INR visit versus [facility] checking INR at next visit and pt. Wife stated pt did not want INR checked at home due to it not being a sterile environment in his home. Educated the pt and wife over speaker phone that this is not a sterile procedure and the reason the level must be monitored along with thorough education. Also, she stated hey did go to [employer] last week and don't know where INR was going to go. Since have not received advised that he would need to be seen in out office. They asked if I would call their dtr and set things up for an office appt. Called Dannette Lilly per verbal over the phone [phone]) and she stated they have tried to explain that this is not a sterile procedure. She was ble to set an appt for the pt tomorrow at 230pm to get education and finger stick in out [place] office.   Left a message for Josiah Lobo with [facility] in reference to next Home Health Visit date so we can pair them up for future INR checks.  Confirmed with wife pt has been taken Warfarin 1mg  daily.

## 2021-11-26 ENCOUNTER — Ambulatory Visit (INDEPENDENT_AMBULATORY_CARE_PROVIDER_SITE_OTHER): Payer: Medicare HMO

## 2021-11-26 DIAGNOSIS — Z952 Presence of prosthetic heart valve: Secondary | ICD-10-CM

## 2021-11-26 DIAGNOSIS — Z7901 Long term (current) use of anticoagulants: Secondary | ICD-10-CM | POA: Insufficient documentation

## 2021-11-26 LAB — POCT INR: INR: 1.2 — AB (ref 2.0–3.0)

## 2021-11-26 NOTE — Patient Instructions (Addendum)
Description   Take 2 tablets today and then START taking 1 tablet (1mg ) daily EXCPET 2 tablets (2mg ) on Monday, Wednesdays, and Fridays.  Stay consistent with Ensure Ensures each week (3 per day)  Coumadin Clinic [phone]       A full discussion of the nature of anticoagulants has been carried out.  A benefit risk analysis has been presented to the patient, so that they understand the justification for choosing anticoagulation at this time. The need for frequent and regular monitoring, precise dosage adjustment and compliance is stressed.  Side effects of potential bleeding are discussed.  The patient should avoid any OTC items containing aspirin or ibuprofen, and should avoid great swings in general diet.  Avoid alcohol consumption.  Call if any signs of abnormal bleeding.

## 2021-11-27 ENCOUNTER — Ambulatory Visit: Payer: Medicare HMO

## 2021-11-28 DIAGNOSIS — I110 Hypertensive heart disease with heart failure: Secondary | ICD-10-CM | POA: Diagnosis not present

## 2021-11-28 DIAGNOSIS — E871 Hypo-osmolality and hyponatremia: Secondary | ICD-10-CM | POA: Diagnosis not present

## 2021-11-28 DIAGNOSIS — I3139 Other pericardial effusion (noninflammatory): Secondary | ICD-10-CM | POA: Diagnosis not present

## 2021-11-28 DIAGNOSIS — K9189 Other postprocedural complications and disorders of digestive system: Secondary | ICD-10-CM | POA: Diagnosis not present

## 2021-11-28 DIAGNOSIS — Z7984 Long term (current) use of oral hypoglycemic drugs: Secondary | ICD-10-CM | POA: Diagnosis not present

## 2021-11-28 DIAGNOSIS — Z7901 Long term (current) use of anticoagulants: Secondary | ICD-10-CM | POA: Diagnosis not present

## 2021-11-28 DIAGNOSIS — Z7982 Long term (current) use of aspirin: Secondary | ICD-10-CM | POA: Diagnosis not present

## 2021-11-28 DIAGNOSIS — I5043 Acute on chronic combined systolic (congestive) and diastolic (congestive) heart failure: Secondary | ICD-10-CM | POA: Diagnosis not present

## 2021-11-28 DIAGNOSIS — Z7951 Long term (current) use of inhaled steroids: Secondary | ICD-10-CM | POA: Diagnosis not present

## 2021-11-28 DIAGNOSIS — I351 Nonrheumatic aortic (valve) insufficiency: Secondary | ICD-10-CM | POA: Diagnosis not present

## 2021-11-28 DIAGNOSIS — I4892 Unspecified atrial flutter: Secondary | ICD-10-CM | POA: Diagnosis not present

## 2021-11-28 DIAGNOSIS — K219 Gastro-esophageal reflux disease without esophagitis: Secondary | ICD-10-CM | POA: Diagnosis not present

## 2021-11-28 DIAGNOSIS — I480 Paroxysmal atrial fibrillation: Secondary | ICD-10-CM | POA: Diagnosis not present

## 2021-11-28 DIAGNOSIS — H409 Unspecified glaucoma: Secondary | ICD-10-CM | POA: Diagnosis not present

## 2021-11-28 DIAGNOSIS — R1314 Dysphagia, pharyngoesophageal phase: Secondary | ICD-10-CM | POA: Diagnosis not present

## 2021-11-28 DIAGNOSIS — D62 Acute posthemorrhagic anemia: Secondary | ICD-10-CM | POA: Diagnosis not present

## 2021-11-28 DIAGNOSIS — R7303 Prediabetes: Secondary | ICD-10-CM | POA: Diagnosis not present

## 2021-11-28 DIAGNOSIS — M1990 Unspecified osteoarthritis, unspecified site: Secondary | ICD-10-CM | POA: Diagnosis not present

## 2021-11-28 DIAGNOSIS — Z48812 Encounter for surgical aftercare following surgery on the circulatory system: Secondary | ICD-10-CM | POA: Diagnosis not present

## 2021-11-28 DIAGNOSIS — J449 Chronic obstructive pulmonary disease, unspecified: Secondary | ICD-10-CM | POA: Diagnosis not present

## 2021-11-28 DIAGNOSIS — I252 Old myocardial infarction: Secondary | ICD-10-CM | POA: Diagnosis not present

## 2021-11-28 DIAGNOSIS — I2510 Atherosclerotic heart disease of native coronary artery without angina pectoris: Secondary | ICD-10-CM | POA: Diagnosis not present

## 2021-11-28 DIAGNOSIS — E785 Hyperlipidemia, unspecified: Secondary | ICD-10-CM | POA: Diagnosis not present

## 2021-11-28 DIAGNOSIS — L89152 Pressure ulcer of sacral region, stage 2: Secondary | ICD-10-CM | POA: Diagnosis not present

## 2021-11-29 ENCOUNTER — Encounter (HOSPITAL_COMMUNITY): Payer: Medicare HMO

## 2021-12-02 ENCOUNTER — Inpatient Hospital Stay: Payer: Medicare HMO | Admitting: Nurse Practitioner

## 2021-12-03 ENCOUNTER — Ambulatory Visit (INDEPENDENT_AMBULATORY_CARE_PROVIDER_SITE_OTHER): Payer: Medicare HMO

## 2021-12-03 ENCOUNTER — Other Ambulatory Visit: Payer: Self-pay | Admitting: Surgery

## 2021-12-03 DIAGNOSIS — Z952 Presence of prosthetic heart valve: Secondary | ICD-10-CM

## 2021-12-03 DIAGNOSIS — Z7901 Long term (current) use of anticoagulants: Secondary | ICD-10-CM | POA: Diagnosis not present

## 2021-12-03 LAB — POCT INR: INR: 1.2 — AB (ref 2.0–3.0)

## 2021-12-04 ENCOUNTER — Ambulatory Visit (HOSPITAL_COMMUNITY)
Admission: RE | Admit: 2021-12-04 | Discharge: 2021-12-04 | Disposition: A | Discharge status: Home / Self Care | Payer: Medicare HMO | Source: Ambulatory Visit | Attending: Family Medicine | Admitting: Family Medicine

## 2021-12-04 ENCOUNTER — Encounter (HOSPITAL_COMMUNITY): Payer: Self-pay

## 2021-12-04 ENCOUNTER — Other Ambulatory Visit: Payer: Self-pay

## 2021-12-04 VITALS — BP 110/64 | HR 78 | Wt 201.2 lb

## 2021-12-04 DIAGNOSIS — R1314 Dysphagia, pharyngoesophageal phase: Secondary | ICD-10-CM | POA: Diagnosis not present

## 2021-12-04 DIAGNOSIS — I309 Acute pericarditis, unspecified: Secondary | ICD-10-CM | POA: Insufficient documentation

## 2021-12-04 DIAGNOSIS — Z7901 Long term (current) use of anticoagulants: Secondary | ICD-10-CM | POA: Diagnosis not present

## 2021-12-04 DIAGNOSIS — K9189 Other postprocedural complications and disorders of digestive system: Secondary | ICD-10-CM | POA: Diagnosis not present

## 2021-12-04 DIAGNOSIS — I5022 Chronic systolic (congestive) heart failure: Secondary | ICD-10-CM | POA: Insufficient documentation

## 2021-12-04 DIAGNOSIS — M1990 Unspecified osteoarthritis, unspecified site: Secondary | ICD-10-CM | POA: Diagnosis not present

## 2021-12-04 DIAGNOSIS — L89152 Pressure ulcer of sacral region, stage 2: Secondary | ICD-10-CM | POA: Diagnosis not present

## 2021-12-04 DIAGNOSIS — J449 Chronic obstructive pulmonary disease, unspecified: Secondary | ICD-10-CM | POA: Diagnosis not present

## 2021-12-04 DIAGNOSIS — D62 Acute posthemorrhagic anemia: Secondary | ICD-10-CM | POA: Diagnosis not present

## 2021-12-04 DIAGNOSIS — I340 Nonrheumatic mitral (valve) insufficiency: Secondary | ICD-10-CM | POA: Diagnosis not present

## 2021-12-04 DIAGNOSIS — I252 Old myocardial infarction: Secondary | ICD-10-CM | POA: Insufficient documentation

## 2021-12-04 DIAGNOSIS — I5043 Acute on chronic combined systolic (congestive) and diastolic (congestive) heart failure: Secondary | ICD-10-CM | POA: Diagnosis not present

## 2021-12-04 DIAGNOSIS — R7303 Prediabetes: Secondary | ICD-10-CM | POA: Diagnosis not present

## 2021-12-04 DIAGNOSIS — E785 Hyperlipidemia, unspecified: Secondary | ICD-10-CM | POA: Insufficient documentation

## 2021-12-04 DIAGNOSIS — J680 Bronchitis and pneumonitis due to chemicals, gases, fumes and vapors: Secondary | ICD-10-CM

## 2021-12-04 DIAGNOSIS — K219 Gastro-esophageal reflux disease without esophagitis: Secondary | ICD-10-CM | POA: Insufficient documentation

## 2021-12-04 DIAGNOSIS — D649 Anemia, unspecified: Secondary | ICD-10-CM | POA: Diagnosis not present

## 2021-12-04 DIAGNOSIS — H409 Unspecified glaucoma: Secondary | ICD-10-CM | POA: Diagnosis not present

## 2021-12-04 DIAGNOSIS — K222 Esophageal obstruction: Secondary | ICD-10-CM | POA: Insufficient documentation

## 2021-12-04 DIAGNOSIS — Z7982 Long term (current) use of aspirin: Secondary | ICD-10-CM | POA: Diagnosis not present

## 2021-12-04 DIAGNOSIS — I480 Paroxysmal atrial fibrillation: Secondary | ICD-10-CM | POA: Insufficient documentation

## 2021-12-04 DIAGNOSIS — I3139 Other pericardial effusion (noninflammatory): Secondary | ICD-10-CM

## 2021-12-04 DIAGNOSIS — Z79899 Other long term (current) drug therapy: Secondary | ICD-10-CM | POA: Insufficient documentation

## 2021-12-04 DIAGNOSIS — I4892 Unspecified atrial flutter: Secondary | ICD-10-CM | POA: Diagnosis not present

## 2021-12-04 DIAGNOSIS — Z953 Presence of xenogenic heart valve: Secondary | ICD-10-CM | POA: Diagnosis not present

## 2021-12-04 DIAGNOSIS — I110 Hypertensive heart disease with heart failure: Secondary | ICD-10-CM | POA: Diagnosis not present

## 2021-12-04 DIAGNOSIS — Z7951 Long term (current) use of inhaled steroids: Secondary | ICD-10-CM | POA: Diagnosis not present

## 2021-12-04 DIAGNOSIS — I351 Nonrheumatic aortic (valve) insufficiency: Secondary | ICD-10-CM | POA: Diagnosis not present

## 2021-12-04 DIAGNOSIS — I2510 Atherosclerotic heart disease of native coronary artery without angina pectoris: Secondary | ICD-10-CM | POA: Insufficient documentation

## 2021-12-04 DIAGNOSIS — Z951 Presence of aortocoronary bypass graft: Secondary | ICD-10-CM | POA: Diagnosis not present

## 2021-12-04 DIAGNOSIS — Z7984 Long term (current) use of oral hypoglycemic drugs: Secondary | ICD-10-CM | POA: Diagnosis not present

## 2021-12-04 DIAGNOSIS — R0600 Dyspnea, unspecified: Secondary | ICD-10-CM

## 2021-12-04 DIAGNOSIS — Z48812 Encounter for surgical aftercare following surgery on the circulatory system: Secondary | ICD-10-CM | POA: Diagnosis not present

## 2021-12-04 DIAGNOSIS — F320 Major depressive disorder, single episode, mild: Secondary | ICD-10-CM

## 2021-12-04 DIAGNOSIS — E871 Hypo-osmolality and hyponatremia: Secondary | ICD-10-CM | POA: Diagnosis not present

## 2021-12-04 LAB — CBC
HCT: 30.3 % — ABNORMAL LOW (ref 39.0–52.0)
Hemoglobin: 9.5 g/dL — ABNORMAL LOW (ref 13.0–17.0)
MCH: 25.7 pg — ABNORMAL LOW (ref 26.0–34.0)
MCHC: 31.4 g/dL (ref 30.0–36.0)
MCV: 81.9 fL (ref 80.0–100.0)
Platelets: 346 10*3/uL (ref 150–400)
RBC: 3.7 MIL/uL — ABNORMAL LOW (ref 4.22–5.81)
RDW: 16.7 % — ABNORMAL HIGH (ref 11.5–15.5)
WBC: 7.6 10*3/uL (ref 4.0–10.5)
nRBC: 0 % (ref 0.0–0.2)

## 2021-12-04 LAB — BASIC METABOLIC PANEL
Anion gap: 10 (ref 5–15)
BUN: 21 mg/dL (ref 8–23)
CO2: 30 mmol/L (ref 22–32)
Calcium: 8.5 mg/dL — ABNORMAL LOW (ref 8.9–10.3)
Chloride: 96 mmol/L — ABNORMAL LOW (ref 98–111)
Creatinine, Ser: 1.36 mg/dL — ABNORMAL HIGH (ref 0.61–1.24)
GFR, Estimated: 55 mL/min — ABNORMAL LOW (ref >60)
Glucose, Bld: 95 mg/dL (ref 70–99)
Potassium: 4 mmol/L (ref 3.5–5.1)
Sodium: 136 mmol/L (ref 135–145)

## 2021-12-04 LAB — BRAIN NATRIURETIC PEPTIDE: B Natriuretic Peptide: 1268.7 pg/mL — ABNORMAL HIGH (ref 0.0–100.0)

## 2021-12-04 MED ORDER — NITROGLYCERIN 0.4 MG SL SUBL: 0.4000 mg | SUBLINGUAL_TABLET | SUBLINGUAL | 3 refills | Status: DC | PRN

## 2021-12-04 MED ORDER — TRELEGY ELLIPTA 100-62.5-25 MCG/ACT IN AEPB: 1.0000 | INHALATION_SPRAY | Freq: Every day | RESPIRATORY_TRACT | 0 refills | Status: DC

## 2021-12-04 MED ORDER — AMIODARONE HCL 200 MG PO TABS: 100.0000 mg | ORAL_TABLET | Freq: Every day | ORAL | 1 refills | Status: DC

## 2021-12-04 MED ORDER — ASPIRIN 81 MG PO TBEC: 81.0000 mg | DELAYED_RELEASE_TABLET | Freq: Every day | ORAL | 1 refills | Status: DC

## 2021-12-04 NOTE — Progress Notes (Signed)
ADVANCED HF CLINIC NOTE   Primary Care: [name], NP HF Cardiologist: Dr. Gala Romney  HPI: Keith Jenkins is a 76 y.o. male with history of COPD, prediabetes, GERD/dysphagia, HLD, and new diagnosis of CAD and systolic heart failure.  Admitted 4/23 with chest pain. Ruled in for inferior STEMI, late presenting. LHC showed total occlusion mRCA and 90% mLAD. RCA managed medically d/t completed infarct.  Plan to optimize medical therapy and arrange LAD intervention at later time. Echo showed EF 20-255%, RWMA, RV moderately reduced, mioderate to severe ischemic MR, aortic root 4.3 cm, dilated IVC with estimated RAP 8 mmHg. AHF consulted and GDMT titrated, but limited due to soft blood pressure. CVTS consulted for CABG/MVR, instead of PCI to LAD, due to moderate to severe ischemic MR. Plan to optimize medically and improve strength, then arrange surgery in a few weeks. Discharge home, weight 217 lbs.   Post hospital follow up 5/23, mildly volume up, REDs 44%. Lasix 20 mg daily restarted. Plan to repeat echo in 1-2 weeks and coordinate timing of surgery.  Admitted 10/25/21 with a/c CHF. Stat echo showed EF 30-35%, RV moderately reduced, moderate pericardial effusion with no tamponade. Started on milrinone. Developed AFL and amiodarone gtt started. Underwent CABG x 2 (LIMA-LAD, SVG-RAMUS INT) +MVR, MAZE, LAA Clip. Hospitalization c/b distal esophageal stricture seen on barium swallow. GI consulted and plan for outpatient EGD/dilation. Diuresed with IV lasix. Drips weaned off, GDMT limited due to hypotension and need for midodrine. Discharged home, 202 lbs.  Today he returns for post hospital HF follow up, with his wife and daughter. Overall feeling fine. 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 Has noticed some LE swelling. Denies palpitations, abnormal bleeding, CP, dizziness, pr PND. Chronically sleeps reclined Appetite ok. No fever or chills. Weight at home 192 pounds.  Taking all medications.   Cardiac Studies: - Echo (5/23): EF 25%, RV moderately reduced, moderate to severe MR  - Echo (4/23): EF 20-255%, RWMA, RV moderately reduced, mioderate to severe ischemic MR, aortic root 4.3 cm, dilated IVC with estimated RAP 8 mmHg  - LHC (5/23): 100% m RCA and 90% m LAD. RCA managed medically d/t completed infarct.   ROS: All systems reviewed and negative except as per HPI.   Past Medical History:  Diagnosis Date   Arthritis    Glaucoma    Current Outpatient Medications  Medication Sig Dispense Refill   albuterol (VENTOLIN HFA) 108 (90 Base) MCG/ACT inhaler Inhale 2 puffs into the lungs every 6 (six) hours as needed for wheezing or shortness of breath. 8 g 0   amiodarone (PACERONE) 200 MG tablet Take 1 tablet (200 mg total) by mouth daily. 30 tablet 1   aspirin 81 MG EC tablet Take 1 tablet (81 mg total) by mouth daily. Swallow whole. 30 tablet 1   atorvastatin (LIPITOR) 80 MG tablet Take 1 tablet (80 mg total) by mouth daily. 30 tablet 1   dapagliflozin propanediol (FARXIGA) 10 MG TABS tablet Take 1 tablet (10 mg total) by mouth daily. 30 tablet 1   digoxin (LANOXIN) 0.125 MG tablet Take 1 tablet (0.125 mg total) by mouth daily. 30 tablet 1   escitalopram (LEXAPRO) 10 MG tablet Take 1 tablet (10 mg total) by mouth daily. 30 tablet 0   esomeprazole (NEXIUM) 40 MG capsule Take 40 mg by mouth daily at 12 noon.     gabapentin (NEURONTIN) 300 MG capsule Take 1 capsule (300 mg total) by mouth at bedtime. 90 capsule 0  latanoprost (XALATAN) 0.005 % ophthalmic solution Place 1 drop into both eyes at bedtime.     midodrine (PROAMATINE) 10 MG tablet Take 1 tablet (10 mg total) by mouth 3 (three) times daily with meals. 90 tablet 3   nitroGLYCERIN (NITROSTAT) 0.4 MG SL tablet Place 1 tablet (0.4 mg total) under the tongue every 5 (five) minutes as needed for chest pain. 25 tablet 3   OVER THE COUNTER MEDICATION Apply 1 application. topically daily as needed (For back  pain). Medication: Magnilife Cream     oxyCODONE (OXY IR/ROXICODONE) 5 MG immediate release tablet Take 1 tablet (5 mg total) by mouth every 4 (four) hours as needed for severe pain. 30 tablet 0   potassium chloride SA (KLOR-CON M) 20 MEQ tablet Take 2 tablets (40 mEq total) by mouth daily. 30 tablet 3   spironolactone (ALDACTONE) 25 MG tablet Take 0.5 tablets (12.5 mg total) by mouth daily. 60 tablet 3   torsemide (DEMADEX) 20 MG tablet Take 2 tablets (40 mg total) by mouth daily. 60 tablet 3   warfarin (COUMADIN) 1 MG tablet Take 2 mg by mouth 2 (two) times daily.     Fluticasone-Umeclidin-Vilant (TRELEGY ELLIPTA) 100-62.5-25 MCG/ACT AEPB Inhale 1 puff into the lungs daily. (Patient not taking: Reported on 12/04/2021) 2 each 0   No current facility-administered medications for this encounter.   No Known Allergies  Social History   Socioeconomic History   Marital status: Married    Spouse name: Not on file   Number of children: Not on file   Years of education: Not on file   Highest education level: Not on file  Occupational History   Not on file  Tobacco Use   Smoking status: Former    Types: Cigarettes    Quit date: 1970    Years since quitting: 53.5   Smokeless tobacco: Never  Substance and Sexual Activity   Alcohol use: Not Currently   Drug use: Never   Sexual activity: Not on file  Other Topics Concern   Not on file  Social History Narrative   Not on file   Social Determinants of Health   Financial Resource Strain: Not on file  Food Insecurity: Not on file  Transportation Needs: Not on file  Physical Activity: Not on file  Stress: Not on file  Social Connections: Not on file  Intimate Partner Violence: Not on file   Family History  Problem Relation Age of Onset   Heart attack Mother    Hypertension Mother    Heart attack Father    Hypertension Father    Stomach cancer Sister    Brain cancer Sister    BP 110/64   Pulse 78   Wt 91.3 kg (201 lb 3.2 oz)    SpO2 97%   BMI 26.55 kg/m   Wt Readings from Last 3 Encounters:  12/04/21 91.3 kg (201 lb 3.2 oz)  11/18/21 92.2 kg (203 lb 4.2 oz)  10/23/21 99.8 kg (220 lb)   PHYSICAL EXAM: General:  NAD. No resp difficulty, walked into clinic with cane HEENT: Normal Neck: Supple. No JVD. Carotids 2+ bilat; no bruits. No lymphadenopathy or thryomegaly appreciated. Cor: PMI nondisplaced. Regular rate & rhythm. No rubs, gallops or murmurs. Sternal incision approximated Lungs: Clear Abdomen: Soft, nontender, nondistended. No hepatosplenomegaly. No bruits or masses. Good bowel sounds. Extremities: No cyanosis, clubbing, rash, 1+ BLE pre-tibial edema Neuro: Alert & oriented x 3, cranial nerves grossly intact. Moves all 4 extremities w/o difficulty. Affect pleasant.  ECG: NSR 76 bpm (personally reviewed).  ASSESSMENT & PLAN: 1. CAD/recent late presentation inferior STEMI - LHC 05/04: 100% m RCA and 90% m LAD.  - RCA managed medically d/t completed infarct.  - Echo (5/23) EF 25%, RV moderately reduced, moderate to severe MR - s/p CABG x 2 (LIMA-LAD, SVG-Ramus INT)  (11/01/21) - No CP - Continue ASA + high intensity statin.  - CR when cleared by surgery.   2. Chronic systolic CHF with low-output/ICM - s/p late presentation inferior STEMI 05/23 - Echo (5/23): EF 25-30%, RV moderately reduced, moderate to severe MR. - Limited echo on admit (5/23): EF 30-35%, RV moderately reduced, MR not well evaluated, moderate pericardial effusion - S/p CABG + bioprosthetic MVR 11/01/21 - NYHA II, he is not volume overloaded today. Needs to elevate legs at home. - Continue torsemide 40 mg daily. - Continue Farxiga 10 mg daily.  - Continue midodrine 10 mg tid, wean as able.  - No room for arb/bb while on midodrine, add back as able.  - Continue digoxin 0.125.   - Continue spiro 12.5 mg daily.  - Labs today, check dig trough next visit.   3. Mitral regurgitation - Moderate to severe ischemic MR on TEE.  - S/p  bioprosthetic MVR 11/02/21. - On warfarin per TCTS. INR followed by [facility].    4. Aortic valve insufficiency - Mild to moderate AS/AI - Stable.    5. Pericardial effusion/Acute Pericarditis  - Moderate in size on echo, no respiratory variation or RV collapse to suggest tamponade. - Acute pericarditis/ adhensions noted at time of CABG. - No chest pain.   6. H/o AKI - BMET today.   7. PAF - s/p MAZE w/ CABG  - On warfarin. - NSR on ECG today. - Decrease amiodarone to 100 mg daily.  8. H/o hyponatremia  - Labs today. - Fluid restrict.   9. Post-op ileus/esophageal stricture - also has h/o esophageal stricture (was unable to pass TEE probe transgastric prior to CABG) - Barium swallow with distal stricture.  - GI planning EGD with dilation.   10. Post-op anemia - CBC today.    Follow up in 3 weeks with PharmD (wean midodrine & check digoxin trough), 6 weeks with APP and 12 weeks with Dr. Gala Romney + echo.  Keith Rome, FNP-BC 12/04/21

## 2021-12-04 NOTE — Patient Instructions (Addendum)
EKG done today.  Labs done today. We will contact you only if your labs are abnormal.  DECREASE Amiodarone to 100mg  (1/2 tablet) by mouth daily.  No other medication changes were made. Please continue all current medications as prescribed.  Your physician recommends that you schedule a follow-up appointment in: 3 weeks with our clinic pharmacist, 6 weeks with our NP/PA Clinic here in our office(please don't take your Digoxin until after this appointment) and in 12 weeks with Dr. with an echo prior to your exam(schedule this at your august 8th appointment).   If you have any questions or concerns before your next appointment please send [phone] a message through [place] or call our office at [phone].    TO LEAVE A MESSAGE FOR THE NURSE SELECT OPTION 2, PLEASE LEAVE A MESSAGE INCLUDING: YOUR NAME DATE OF BIRTH CALL BACK NUMBER REASON FOR CALL**this is important as we prioritize the call backs  YOU WILL RECEIVE A CALL BACK THE SAME DAY AS LONG AS YOU CALL BEFORE 4:00 PM   Do the following things EVERYDAY: Weigh yourself in the morning before breakfast. Write it down and keep it in a log. Take your medicines as prescribed Eat low salt foods--Limit salt (sodium) to 2000 mg per day.  Stay as active as you can everyday Limit all fluids for the day to less than 2 liters   At the [facility], you and your health needs are our priority. As part of our continuing mission to provide you with exceptional heart care, we have created designated Provider Care Teams. These Care Teams include your primary Cardiologist (physician) and Advanced Practice Providers (APPs- Physician Assistants and Nurse Practitioners) who all work together to provide you with the care you need, when you need it.   You may see any of the following providers on your designated Care Team at your next follow up: Dr [phone] Dr Arvilla Meres, NP Carron Curie, Robbie Lis [place],  PharmD   Please be sure to bring in all your medications bottles to every appointment.

## 2021-12-05 ENCOUNTER — Encounter: Payer: Self-pay | Admitting: Surgery

## 2021-12-05 ENCOUNTER — Ambulatory Visit
Admission: RE | Admit: 2021-12-05 | Discharge: 2021-12-05 | Disposition: A | Discharge status: Home / Self Care | Payer: Medicare HMO | Source: Ambulatory Visit | Attending: Surgery | Admitting: Surgery

## 2021-12-05 ENCOUNTER — Encounter: Payer: Self-pay | Admitting: Nurse Practitioner

## 2021-12-05 ENCOUNTER — Ambulatory Visit (INDEPENDENT_AMBULATORY_CARE_PROVIDER_SITE_OTHER): Payer: Self-pay | Admitting: Surgery

## 2021-12-05 VITALS — BP 101/65 | HR 80 | Resp 20 | Wt 201.0 lb

## 2021-12-05 DIAGNOSIS — R1314 Dysphagia, pharyngoesophageal phase: Secondary | ICD-10-CM | POA: Diagnosis not present

## 2021-12-05 DIAGNOSIS — M1990 Unspecified osteoarthritis, unspecified site: Secondary | ICD-10-CM | POA: Diagnosis not present

## 2021-12-05 DIAGNOSIS — I3139 Other pericardial effusion (noninflammatory): Secondary | ICD-10-CM | POA: Diagnosis not present

## 2021-12-05 DIAGNOSIS — I351 Nonrheumatic aortic (valve) insufficiency: Secondary | ICD-10-CM | POA: Diagnosis not present

## 2021-12-05 DIAGNOSIS — L89152 Pressure ulcer of sacral region, stage 2: Secondary | ICD-10-CM | POA: Diagnosis not present

## 2021-12-05 DIAGNOSIS — I252 Old myocardial infarction: Secondary | ICD-10-CM | POA: Diagnosis not present

## 2021-12-05 DIAGNOSIS — I700 Atherosclerosis of aorta: Secondary | ICD-10-CM | POA: Diagnosis not present

## 2021-12-05 DIAGNOSIS — Z952 Presence of prosthetic heart valve: Secondary | ICD-10-CM

## 2021-12-05 DIAGNOSIS — R7303 Prediabetes: Secondary | ICD-10-CM | POA: Diagnosis not present

## 2021-12-05 DIAGNOSIS — I5043 Acute on chronic combined systolic (congestive) and diastolic (congestive) heart failure: Secondary | ICD-10-CM | POA: Diagnosis not present

## 2021-12-05 DIAGNOSIS — Z9889 Other specified postprocedural states: Secondary | ICD-10-CM | POA: Diagnosis not present

## 2021-12-05 DIAGNOSIS — E785 Hyperlipidemia, unspecified: Secondary | ICD-10-CM | POA: Diagnosis not present

## 2021-12-05 DIAGNOSIS — I110 Hypertensive heart disease with heart failure: Secondary | ICD-10-CM | POA: Diagnosis not present

## 2021-12-05 DIAGNOSIS — Z7984 Long term (current) use of oral hypoglycemic drugs: Secondary | ICD-10-CM | POA: Diagnosis not present

## 2021-12-05 DIAGNOSIS — I480 Paroxysmal atrial fibrillation: Secondary | ICD-10-CM | POA: Diagnosis not present

## 2021-12-05 DIAGNOSIS — E871 Hypo-osmolality and hyponatremia: Secondary | ICD-10-CM | POA: Diagnosis not present

## 2021-12-05 DIAGNOSIS — H409 Unspecified glaucoma: Secondary | ICD-10-CM | POA: Diagnosis not present

## 2021-12-05 DIAGNOSIS — D62 Acute posthemorrhagic anemia: Secondary | ICD-10-CM | POA: Diagnosis not present

## 2021-12-05 DIAGNOSIS — I4892 Unspecified atrial flutter: Secondary | ICD-10-CM | POA: Diagnosis not present

## 2021-12-05 DIAGNOSIS — Z48812 Encounter for surgical aftercare following surgery on the circulatory system: Secondary | ICD-10-CM | POA: Diagnosis not present

## 2021-12-05 DIAGNOSIS — Z7951 Long term (current) use of inhaled steroids: Secondary | ICD-10-CM | POA: Diagnosis not present

## 2021-12-05 DIAGNOSIS — K9189 Other postprocedural complications and disorders of digestive system: Secondary | ICD-10-CM | POA: Diagnosis not present

## 2021-12-05 DIAGNOSIS — J984 Other disorders of lung: Secondary | ICD-10-CM | POA: Diagnosis not present

## 2021-12-05 DIAGNOSIS — K219 Gastro-esophageal reflux disease without esophagitis: Secondary | ICD-10-CM | POA: Diagnosis not present

## 2021-12-05 DIAGNOSIS — J90 Pleural effusion, not elsewhere classified: Secondary | ICD-10-CM | POA: Diagnosis not present

## 2021-12-05 DIAGNOSIS — I2510 Atherosclerotic heart disease of native coronary artery without angina pectoris: Secondary | ICD-10-CM | POA: Diagnosis not present

## 2021-12-05 DIAGNOSIS — Z7982 Long term (current) use of aspirin: Secondary | ICD-10-CM | POA: Diagnosis not present

## 2021-12-05 DIAGNOSIS — J449 Chronic obstructive pulmonary disease, unspecified: Secondary | ICD-10-CM | POA: Diagnosis not present

## 2021-12-05 DIAGNOSIS — Z7901 Long term (current) use of anticoagulants: Secondary | ICD-10-CM | POA: Diagnosis not present

## 2021-12-05 MED ORDER — ESCITALOPRAM OXALATE 10 MG PO TABS: 10.0000 mg | ORAL_TABLET | Freq: Every day | ORAL | 2 refills | Status: DC

## 2021-12-05 MED ORDER — TRELEGY ELLIPTA 100-62.5-25 MCG/ACT IN AEPB: 1.0000 | INHALATION_SPRAY | Freq: Every day | RESPIRATORY_TRACT | 2 refills | Status: DC

## 2021-12-05 NOTE — Progress Notes (Signed)
HPI:  [name] returns today for follow-up status post CABG x2, biatrial Maze procedure with clipping of left atrial appendage, and a chordal-sparing mitral valve replacement with a 27 mm MITRIS Resilia pericardial valve on 11/01/2021.  He was admitted with a late presenting inferior STEMI with ejection fraction of 25 to 30% by echocardiogram, moderate to severe ischemic mitral regurgitation, and moderate RV systolic dysfunction.  The aortic valve was thickened with some calcification and immobility of left coronary leaflet with a mean gradient of only 6 mmHg but the stroke-volume index was low at 19.  He had mild to moderate AI.  Cardiac catheterization showed mid RCA occlusion that was felt to be the culprit.  There is also a 90% mid LAD stenosis at the origin of a large septal perforator.  He was tuned up and sent home on a diuretic with plans for outpatient surgery in a few weeks but returned with worsening congestive heart failure and angina with weight gain, bilateral pleural effusions, and a markedly elevated proBNP of 6000.  He was started on milrinone 0.25 and due to a Co-ox of 58%.  He had recurrent angina and was started on heparin and nitroglycerin.  He also had some atrial fibrillation and converted to sinus on amiodarone.  He had a slow postoperative course due to chronic deconditioning and biventricular dysfunction.  He remained on milrinone and norepinephrine to support his blood pressure.  The milrinone was weaned off and he was transition to midodrine.  He was diuresed with IV Lasix.  GDMT was limited due to borderline blood pressure.  He has a long history of dysphagia which made p.o. intake difficult initially postoperatively.  He had an esophagram which showed a distal esophageal stricture that was tight.  He was eventually started on a liquid diet and advance to soft food which she has tolerated well.  Since discharge he said that he is felt much better.  He is walking daily  without chest pain.  He has mild exertional shortness of breath.  His main complaint has been of lower extremity swelling.  His family has been keeping a chart of his weights every day and his weight has been gradually decreasing.  His weight at home yesterday was recorded at 192 pounds but today in the office it was 201 pounds.  He was 202 pounds at the time of discharge.  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  He was seen by Allena Katz, FNP in the advanced heart failure clinic yesterday.  He had some labs drawn which showed a sodium of 136, potassium 4.0, BUN of 21 and creatinine of 1.36.  BNP was elevated at 1268.7.  Hemoglobin was 9.5 with a white blood cell count of 7.6.  His INR was 1.2 on 12/03/2021 and his Coumadin dose was increased to 2 mg/day from 1 mg/day and he was told not to drink anymore Ensure.  Current Outpatient Medications  Medication Sig Dispense Refill   albuterol (VENTOLIN HFA) 108 (90 Base) MCG/ACT inhaler Inhale 2 puffs into the lungs every 6 (six) hours as needed for wheezing or shortness of breath. 8 g 0   amiodarone (PACERONE) 200 MG tablet Take 0.5 tablets (100 mg total) by mouth daily. 45 tablet 1   aspirin EC 81 MG tablet Take 1 tablet (81 mg total) by mouth daily. Swallow whole. 90 tablet 1   atorvastatin (  LIPITOR) 80 MG tablet Take 1 tablet (80 mg total) by mouth daily. 30 tablet 1   dapagliflozin propanediol (FARXIGA) 10 MG TABS tablet Take 1 tablet (10 mg total) by mouth daily. 30 tablet 1   digoxin (LANOXIN) 0.125 MG tablet Take 1 tablet (0.125 mg total) by mouth daily. 30 tablet 1   escitalopram (LEXAPRO) 10 MG tablet Take 1 tablet (10 mg total) by mouth daily. 30 tablet 0   esomeprazole (NEXIUM) 40 MG capsule Take 40 mg by mouth daily at 12 noon.     Fluticasone-Umeclidin-Vilant (TRELEGY ELLIPTA) 100-62.5-25 MCG/ACT AEPB Inhale 1 puff into the lungs daily. 1  each 0   gabapentin (NEURONTIN) 300 MG capsule Take 1 capsule (300 mg total) by mouth at bedtime. 90 capsule 0   latanoprost (XALATAN) 0.005 % ophthalmic solution Place 1 drop into both eyes at bedtime.     midodrine (PROAMATINE) 10 MG tablet Take 1 tablet (10 mg total) by mouth 3 (three) times daily with meals. 90 tablet 3   nitroGLYCERIN (NITROSTAT) 0.4 MG SL tablet Place 1 tablet (0.4 mg total) under the tongue every 5 (five) minutes as needed for chest pain. 25 tablet 3   OVER THE COUNTER MEDICATION Apply 1 application. topically daily as needed (For back pain). Medication: Magnilife Cream     oxyCODONE (OXY IR/ROXICODONE) 5 MG immediate release tablet Take 1 tablet (5 mg total) by mouth every 4 (four) hours as needed for severe pain. 30 tablet 0   potassium chloride SA (KLOR-CON M) 20 MEQ tablet Take 2 tablets (40 mEq total) by mouth daily. 30 tablet 3   spironolactone (ALDACTONE) 25 MG tablet Take 0.5 tablets (12.5 mg total) by mouth daily. 60 tablet 3   torsemide (DEMADEX) 20 MG tablet Take 2 tablets (40 mg total) by mouth daily. 60 tablet 3   warfarin (COUMADIN) 1 MG tablet Take 2 mg by mouth 2 (two) times daily.     No current facility-administered medications for this visit.     Physical Exam: BP 101/65   Pulse 80   Resp 20   Wt 201 lb (91.2 kg)   SpO2 95% Comment: RA  BMI 26.52 kg/m  He looks well. Cardiac exam shows a regular rate and rhythm with normal heart sounds.  There is no murmur. Lung exam reveals decreased breath sounds in the bases. The chest incision is healing well and the sternum is stable. There is moderate bilateral lower extremity edema worse on the right than the left.  He has some palpable hematoma or seroma along the vein harvest tunnel in the thigh.  Diagnostic Tests:  Narrative & Impression  CLINICAL DATA:  History of mitral valve replacement   EXAM: CHEST - 2 VIEW   COMPARISON:  11/04/2021, 11/03/2021, 10/10/2021   FINDINGS: Post sternotomy  changes with valve prosthesis and left atrial appendage clip. Removal of support lines and tubes from prior exam. Small right and moderate left bilateral pleural effusions. Airspace disease at the left base. Cardiomediastinal silhouette within normal limits. Aortic atherosclerosis. No visible pneumothorax.   IMPRESSION: 1. Small right and moderate left pleural effusions, effusion on the left is slightly increased compared to prior radiograph from May. Airspace disease at the left base, favor atelectasis. 2. Postsurgical changes of the mediastinum with removal of previously noted support devices.     Electronically Signed   By: Jasmine Pang M.D.   On: 12/05/2021 15:45      Impression:  He is doing fairly well 1 month  following his surgery.  He still has significant bilateral lower extremity edema and small to moderate bilateral pleural effusions with a BNP of 1200.  His BUN and creatinine are slightly higher than they were on previous check.  Given his low normal blood pressure on midodrine I do not think he will tolerate higher dose diuretic at this time.  He has been maintaining a low-sodium diet and keeping his legs elevated when he is not ambulatory.  He may benefit from some compression stockings.  He says that he feels well overall and has minimal shortness of breath with exertion.  Hopefully he will continue to improve.  He needs to remain on Coumadin for 3 months postoperatively with a bioprosthetic mitral valve and then can be converted to aspirin alone if he is maintaining sinus rhythm.  I will leave that decision up to Dr. Gala Romney.  I asked him not to do any heavy lifting for 3 months postoperatively.  Plan:  I will plan to see him back in the office in 1 month with a repeat chest x-ray to follow-up on his pleural effusions.  He has follow-up arranged with the advanced heart failure clinic and the anticoagulation clinic.   [name], MD [facility] [phone]

## 2021-12-07 DIAGNOSIS — I351 Nonrheumatic aortic (valve) insufficiency: Secondary | ICD-10-CM | POA: Diagnosis not present

## 2021-12-07 DIAGNOSIS — E871 Hypo-osmolality and hyponatremia: Secondary | ICD-10-CM | POA: Diagnosis not present

## 2021-12-07 DIAGNOSIS — K219 Gastro-esophageal reflux disease without esophagitis: Secondary | ICD-10-CM | POA: Diagnosis not present

## 2021-12-07 DIAGNOSIS — I5043 Acute on chronic combined systolic (congestive) and diastolic (congestive) heart failure: Secondary | ICD-10-CM | POA: Diagnosis not present

## 2021-12-07 DIAGNOSIS — Z48812 Encounter for surgical aftercare following surgery on the circulatory system: Secondary | ICD-10-CM | POA: Diagnosis not present

## 2021-12-07 DIAGNOSIS — J449 Chronic obstructive pulmonary disease, unspecified: Secondary | ICD-10-CM | POA: Diagnosis not present

## 2021-12-07 DIAGNOSIS — D62 Acute posthemorrhagic anemia: Secondary | ICD-10-CM | POA: Diagnosis not present

## 2021-12-07 DIAGNOSIS — R1314 Dysphagia, pharyngoesophageal phase: Secondary | ICD-10-CM | POA: Diagnosis not present

## 2021-12-07 DIAGNOSIS — I4892 Unspecified atrial flutter: Secondary | ICD-10-CM | POA: Diagnosis not present

## 2021-12-07 DIAGNOSIS — L89152 Pressure ulcer of sacral region, stage 2: Secondary | ICD-10-CM | POA: Diagnosis not present

## 2021-12-07 DIAGNOSIS — Z7982 Long term (current) use of aspirin: Secondary | ICD-10-CM | POA: Diagnosis not present

## 2021-12-07 DIAGNOSIS — R7303 Prediabetes: Secondary | ICD-10-CM | POA: Diagnosis not present

## 2021-12-07 DIAGNOSIS — I2510 Atherosclerotic heart disease of native coronary artery without angina pectoris: Secondary | ICD-10-CM | POA: Diagnosis not present

## 2021-12-07 DIAGNOSIS — I480 Paroxysmal atrial fibrillation: Secondary | ICD-10-CM | POA: Diagnosis not present

## 2021-12-07 DIAGNOSIS — Z7984 Long term (current) use of oral hypoglycemic drugs: Secondary | ICD-10-CM | POA: Diagnosis not present

## 2021-12-07 DIAGNOSIS — Z7901 Long term (current) use of anticoagulants: Secondary | ICD-10-CM | POA: Diagnosis not present

## 2021-12-07 DIAGNOSIS — M1990 Unspecified osteoarthritis, unspecified site: Secondary | ICD-10-CM | POA: Diagnosis not present

## 2021-12-07 DIAGNOSIS — H409 Unspecified glaucoma: Secondary | ICD-10-CM | POA: Diagnosis not present

## 2021-12-07 DIAGNOSIS — I3139 Other pericardial effusion (noninflammatory): Secondary | ICD-10-CM | POA: Diagnosis not present

## 2021-12-07 DIAGNOSIS — E785 Hyperlipidemia, unspecified: Secondary | ICD-10-CM | POA: Diagnosis not present

## 2021-12-07 DIAGNOSIS — I110 Hypertensive heart disease with heart failure: Secondary | ICD-10-CM | POA: Diagnosis not present

## 2021-12-07 DIAGNOSIS — Z7951 Long term (current) use of inhaled steroids: Secondary | ICD-10-CM | POA: Diagnosis not present

## 2021-12-07 DIAGNOSIS — I252 Old myocardial infarction: Secondary | ICD-10-CM | POA: Diagnosis not present

## 2021-12-07 DIAGNOSIS — K9189 Other postprocedural complications and disorders of digestive system: Secondary | ICD-10-CM | POA: Diagnosis not present

## 2021-12-09 ENCOUNTER — Other Ambulatory Visit (HOSPITAL_COMMUNITY): Payer: Self-pay

## 2021-12-11 ENCOUNTER — Ambulatory Visit (INDEPENDENT_AMBULATORY_CARE_PROVIDER_SITE_OTHER): Payer: Medicare HMO | Admitting: *Deleted

## 2021-12-11 DIAGNOSIS — Z7901 Long term (current) use of anticoagulants: Secondary | ICD-10-CM

## 2021-12-11 DIAGNOSIS — Z952 Presence of prosthetic heart valve: Secondary | ICD-10-CM | POA: Diagnosis not present

## 2021-12-11 LAB — POCT INR: INR: 1.3 — AB (ref 2.0–3.0)

## 2021-12-11 MED ORDER — WARFARIN SODIUM 1 MG PO TABS: ORAL_TABLET | ORAL | 1 refills | Status: DC

## 2021-12-11 NOTE — Patient Instructions (Signed)
Description   Today and tomorrow take 3 tablets then START taking 2 tablets (2mg ) daily except 3 tablets on Monday, Wednesday, and Friday. Recheck INR in 1 week. Stay consistent with Ensures each week (3 per day) [facility]in Clinic [phone]

## 2021-12-12 DIAGNOSIS — I480 Paroxysmal atrial fibrillation: Secondary | ICD-10-CM | POA: Diagnosis not present

## 2021-12-12 DIAGNOSIS — D62 Acute posthemorrhagic anemia: Secondary | ICD-10-CM | POA: Diagnosis not present

## 2021-12-12 DIAGNOSIS — K219 Gastro-esophageal reflux disease without esophagitis: Secondary | ICD-10-CM | POA: Diagnosis not present

## 2021-12-12 DIAGNOSIS — H409 Unspecified glaucoma: Secondary | ICD-10-CM | POA: Diagnosis not present

## 2021-12-12 DIAGNOSIS — L89152 Pressure ulcer of sacral region, stage 2: Secondary | ICD-10-CM | POA: Diagnosis not present

## 2021-12-12 DIAGNOSIS — I252 Old myocardial infarction: Secondary | ICD-10-CM | POA: Diagnosis not present

## 2021-12-12 DIAGNOSIS — I4892 Unspecified atrial flutter: Secondary | ICD-10-CM | POA: Diagnosis not present

## 2021-12-12 DIAGNOSIS — Z7951 Long term (current) use of inhaled steroids: Secondary | ICD-10-CM | POA: Diagnosis not present

## 2021-12-12 DIAGNOSIS — R7303 Prediabetes: Secondary | ICD-10-CM | POA: Diagnosis not present

## 2021-12-12 DIAGNOSIS — Z7984 Long term (current) use of oral hypoglycemic drugs: Secondary | ICD-10-CM | POA: Diagnosis not present

## 2021-12-12 DIAGNOSIS — J449 Chronic obstructive pulmonary disease, unspecified: Secondary | ICD-10-CM | POA: Diagnosis not present

## 2021-12-12 DIAGNOSIS — I2510 Atherosclerotic heart disease of native coronary artery without angina pectoris: Secondary | ICD-10-CM | POA: Diagnosis not present

## 2021-12-12 DIAGNOSIS — M1990 Unspecified osteoarthritis, unspecified site: Secondary | ICD-10-CM | POA: Diagnosis not present

## 2021-12-12 DIAGNOSIS — R1314 Dysphagia, pharyngoesophageal phase: Secondary | ICD-10-CM | POA: Diagnosis not present

## 2021-12-12 DIAGNOSIS — K9189 Other postprocedural complications and disorders of digestive system: Secondary | ICD-10-CM | POA: Diagnosis not present

## 2021-12-12 DIAGNOSIS — I110 Hypertensive heart disease with heart failure: Secondary | ICD-10-CM | POA: Diagnosis not present

## 2021-12-12 DIAGNOSIS — Z7982 Long term (current) use of aspirin: Secondary | ICD-10-CM | POA: Diagnosis not present

## 2021-12-12 DIAGNOSIS — I351 Nonrheumatic aortic (valve) insufficiency: Secondary | ICD-10-CM | POA: Diagnosis not present

## 2021-12-12 DIAGNOSIS — E785 Hyperlipidemia, unspecified: Secondary | ICD-10-CM | POA: Diagnosis not present

## 2021-12-12 DIAGNOSIS — E871 Hypo-osmolality and hyponatremia: Secondary | ICD-10-CM | POA: Diagnosis not present

## 2021-12-12 DIAGNOSIS — I5043 Acute on chronic combined systolic (congestive) and diastolic (congestive) heart failure: Secondary | ICD-10-CM | POA: Diagnosis not present

## 2021-12-12 DIAGNOSIS — Z48812 Encounter for surgical aftercare following surgery on the circulatory system: Secondary | ICD-10-CM | POA: Diagnosis not present

## 2021-12-12 DIAGNOSIS — I3139 Other pericardial effusion (noninflammatory): Secondary | ICD-10-CM | POA: Diagnosis not present

## 2021-12-12 DIAGNOSIS — Z7901 Long term (current) use of anticoagulants: Secondary | ICD-10-CM | POA: Diagnosis not present

## 2021-12-12 NOTE — Progress Notes (Signed)
Advanced Heart Failure Clinic Note   Primary Care: [name], NP HF Cardiologist: Dr. Gala Romney  HPI:  Keith Jenkins is a 76 y.o. male with history of COPD, prediabetes, GERD/dysphagia, HLD, and new diagnosis of CAD and systolic heart failure.   Admitted 09/2021 with chest pain. Ruled in for inferior STEMI, late presenting. LHC showed total occlusion mRCA and 90% mLAD. RCA managed medically d/t completed infarct.  Plan to optimize medical therapy and arrange LAD intervention at later time. Echo showed EF 20-25%, RWMA, RV moderately reduced, moderate to severe ischemic MR, aortic root 4.3 cm, dilated IVC with estimated RAP 8 mmHg. AHF consulted and GDMT titrated, but limited due to soft blood pressure. CVTS consulted for CABG/MVR, instead of PCI to LAD, due to moderate to severe ischemic MR. Plan to optimize medically and improve strength, then arrange surgery in a few weeks. Discharge home, weight 217 lbs.   Post hospital follow up 10/2021, mildly volume up, REDs 44%. Lasix 20 mg daily restarted. Plan to repeat echo in 1-2 weeks and coordinate timing of surgery.   Admitted 10/25/21 with a/c CHF. Stat echo showed EF 30-35%, RV moderately reduced, moderate pericardial effusion with no tamponade. Started on milrinone. Developed AFL and amiodarone gtt started. Underwent CABG x 2 (LIMA-LAD, SVG-RAMUS INT) +MVR, MAZE, LAA Clip. Hospitalization c/b distal esophageal stricture seen on barium swallow. GI consulted and plan for outpatient EGD/dilation. Diuresed with IV Lasix. Drips weaned off, GDMT limited due to hypotension and need for midodrine. Discharged home, 202 lbs.   Returned for post hospital HF follow up 12/04/21, with his wife and daughter. Overall was feeling fine. Reported walking around [place] short distances without significant dyspnea. Had noticed some LE swelling. Denied palpitations, abnormal bleeding, CP, dizziness, or PND. Chronically sleeps reclined. Appetite was ok. No fever or  chills. Weight at home was 192 pounds. Reported taking all medications.   Today he returns to HF clinic for pharmacist medication titration. At last visit with [facility], amiodarone was decreased to 100 mg daily. Overall he is feeling well today. Does get dizzy when he rides a long way in the car and when first getting up in the morning. Also will get lightheaded when going outside when the temperature is >90 degrees. He does not check his BP at home. Monia Pouch was supposed to send them an automatic BP cuff, but has not received yet. BP in clinic 106/66 (right arm) and 104/60 (left arm). He starts cardiac rehab next week. He does note more fatigue in the evening and attributes this to walking more. He can walk to his garden and back (75 yards both ways) without getting SOB as long as he does this in the morning before it gets too hot. Weight at home has been stable. LEE has improved, only trace bilateral LEE on exam today. Sleeps on 1 pillow. No PND.     HF Medications: Farxiga 10 mg daily Digoxin 0.125 mg daily Spironolactone 12.5 mg daily Torsemide 40 mg daily KCL 40 mEq daily  Has the patient been experiencing any side effects to the medications prescribed?  no  Does the patient have any problems obtaining medications due to transportation or finances?   No; [employer] Medicare  Understanding of regimen: fair Understanding of indications: fair Potential of compliance: good - wife and daughter help with his medications.  Patient understands to avoid NSAIDs. Patient understands to avoid decongestants.    Pertinent Lab Values: 12/04/21: Serum creatinine 1.36, BUN 21, Potassium 4.0, Sodium  136  Vital Signs: Weight:  189.4 lbs Blood pressure: 104/60  Heart rate: 65   Assessment/Plan: 1. CAD/recent late presentation inferior STEMI - LHC 10/10/21: 100% m RCA and 90% m LAD.  - RCA managed medically d/t completed infarct.  - Echo (10/2021) EF 25%, RV moderately reduced, moderate to severe MR - s/p CABG  x 2 (LIMA-LAD, SVG-Ramus INT)  (11/01/21) - No CP - Continue ASA + high intensity statin.  - Starts CR next week   2. Chronic systolic CHF with low-output/ICM - s/p late presentation inferior STEMI 10/2021 - Echo (10/2021): EF 25-30%, RV moderately reduced, moderate to severe MR. - Limited echo on admit (10/2021): EF 30-35%, RV moderately reduced, MR not well evaluated, moderate pericardial effusion - S/p CABG + bioprosthetic MVR 11/01/21 - NYHA II, he is not volume overloaded today. LEE has improved.  - Continue torsemide 40 mg daily. - Continue spironolactone 12.5 mg daily.  - Continue Farxiga 10 mg daily.  - Continue digoxin 0.125 mg daily.  Repeat digoxin level next visit. Had already taken digoxin this morning.  - Continue midodrine 10 mg TID, wean as able. Will not decrease today with low BP in clinic. Instructed to start recording home BP and bring log to next clinic visit.  - No room for arb/bb while on midodrine, add back as able.    3. Mitral regurgitation - Moderate to severe ischemic MR on TEE.  - S/p bioprosthetic MVR 11/02/21. - On warfarin per TCTS. INR followed by [facility].    4. Aortic valve insufficiency - Mild to moderate AS/AI - Stable.    5. Pericardial effusion/Acute Pericarditis  - Moderate in size on echo, no respiratory variation or RV collapse to suggest tamponade. - Acute pericarditis/ adhensions noted at time of CABG. - No chest pain.   6. H/o AKI - Scr 1.36 on last check   7. PAF - s/p MAZE w/ CABG  - On warfarin. - NSR on ECG 12/04/21. - Continue amiodarone 100 mg daily.   8. H/o hyponatremia  -Na 136 on last check 12/04/21 - Fluid restrict.   9. Post-op ileus/esophageal stricture - also has h/o esophageal stricture (was unable to pass TEE probe transgastric prior to CABG) - Barium swallow with distal stricture.  - GI planning EGD with dilation.   10. Post-op anemia - CBC stable 12/04/21, HgB 9.5.   Follow up 3 weeks with [facility]. Check BMET and digoxin.    Karle Plumber, PharmD, BCPS, BCCP, CPP Heart Failure Clinic Pharmacist [phone]

## 2021-12-16 DIAGNOSIS — D62 Acute posthemorrhagic anemia: Secondary | ICD-10-CM | POA: Diagnosis not present

## 2021-12-16 DIAGNOSIS — I110 Hypertensive heart disease with heart failure: Secondary | ICD-10-CM | POA: Diagnosis not present

## 2021-12-16 DIAGNOSIS — H409 Unspecified glaucoma: Secondary | ICD-10-CM | POA: Diagnosis not present

## 2021-12-16 DIAGNOSIS — R7303 Prediabetes: Secondary | ICD-10-CM | POA: Diagnosis not present

## 2021-12-16 DIAGNOSIS — J449 Chronic obstructive pulmonary disease, unspecified: Secondary | ICD-10-CM | POA: Diagnosis not present

## 2021-12-16 DIAGNOSIS — Z48812 Encounter for surgical aftercare following surgery on the circulatory system: Secondary | ICD-10-CM | POA: Diagnosis not present

## 2021-12-16 DIAGNOSIS — I2510 Atherosclerotic heart disease of native coronary artery without angina pectoris: Secondary | ICD-10-CM | POA: Diagnosis not present

## 2021-12-16 DIAGNOSIS — K9189 Other postprocedural complications and disorders of digestive system: Secondary | ICD-10-CM | POA: Diagnosis not present

## 2021-12-16 DIAGNOSIS — Z7984 Long term (current) use of oral hypoglycemic drugs: Secondary | ICD-10-CM | POA: Diagnosis not present

## 2021-12-16 DIAGNOSIS — I5043 Acute on chronic combined systolic (congestive) and diastolic (congestive) heart failure: Secondary | ICD-10-CM | POA: Diagnosis not present

## 2021-12-16 DIAGNOSIS — I351 Nonrheumatic aortic (valve) insufficiency: Secondary | ICD-10-CM | POA: Diagnosis not present

## 2021-12-16 DIAGNOSIS — E871 Hypo-osmolality and hyponatremia: Secondary | ICD-10-CM | POA: Diagnosis not present

## 2021-12-16 DIAGNOSIS — I4892 Unspecified atrial flutter: Secondary | ICD-10-CM | POA: Diagnosis not present

## 2021-12-16 DIAGNOSIS — Z7982 Long term (current) use of aspirin: Secondary | ICD-10-CM | POA: Diagnosis not present

## 2021-12-16 DIAGNOSIS — L89152 Pressure ulcer of sacral region, stage 2: Secondary | ICD-10-CM | POA: Diagnosis not present

## 2021-12-16 DIAGNOSIS — Z7901 Long term (current) use of anticoagulants: Secondary | ICD-10-CM | POA: Diagnosis not present

## 2021-12-16 DIAGNOSIS — I252 Old myocardial infarction: Secondary | ICD-10-CM | POA: Diagnosis not present

## 2021-12-16 DIAGNOSIS — K219 Gastro-esophageal reflux disease without esophagitis: Secondary | ICD-10-CM | POA: Diagnosis not present

## 2021-12-16 DIAGNOSIS — Z7951 Long term (current) use of inhaled steroids: Secondary | ICD-10-CM | POA: Diagnosis not present

## 2021-12-16 DIAGNOSIS — E785 Hyperlipidemia, unspecified: Secondary | ICD-10-CM | POA: Diagnosis not present

## 2021-12-16 DIAGNOSIS — I480 Paroxysmal atrial fibrillation: Secondary | ICD-10-CM | POA: Diagnosis not present

## 2021-12-16 DIAGNOSIS — I3139 Other pericardial effusion (noninflammatory): Secondary | ICD-10-CM | POA: Diagnosis not present

## 2021-12-16 DIAGNOSIS — R1314 Dysphagia, pharyngoesophageal phase: Secondary | ICD-10-CM | POA: Diagnosis not present

## 2021-12-16 DIAGNOSIS — M1990 Unspecified osteoarthritis, unspecified site: Secondary | ICD-10-CM | POA: Diagnosis not present

## 2021-12-17 ENCOUNTER — Ambulatory Visit (INDEPENDENT_AMBULATORY_CARE_PROVIDER_SITE_OTHER): Payer: Medicare HMO

## 2021-12-17 ENCOUNTER — Other Ambulatory Visit (HOSPITAL_COMMUNITY): Payer: Self-pay | Admitting: *Deleted

## 2021-12-17 DIAGNOSIS — Z7901 Long term (current) use of anticoagulants: Secondary | ICD-10-CM | POA: Diagnosis not present

## 2021-12-17 DIAGNOSIS — Z952 Presence of prosthetic heart valve: Secondary | ICD-10-CM

## 2021-12-17 LAB — POCT INR: INR: 1.5 — AB (ref 2.0–3.0)

## 2021-12-17 MED ORDER — MIDODRINE HCL 10 MG PO TABS: 10.0000 mg | ORAL_TABLET | Freq: Three times a day (TID) | ORAL | 3 refills | Status: DC

## 2021-12-17 MED ORDER — POTASSIUM CHLORIDE CRYS ER 20 MEQ PO TBCR: 40.0000 meq | EXTENDED_RELEASE_TABLET | Freq: Every day | ORAL | 3 refills | Status: DC

## 2021-12-17 MED ORDER — TORSEMIDE 20 MG PO TABS: 40.0000 mg | ORAL_TABLET | Freq: Every day | ORAL | 3 refills | Status: DC

## 2021-12-17 NOTE — Patient Instructions (Signed)
Description   START taking 3 tablets (3mg ) daily except 2 tablets (2mg ) on Sundays. Recheck INR in 1 week. Coumadin Clinic [phone]

## 2021-12-18 DIAGNOSIS — E785 Hyperlipidemia, unspecified: Secondary | ICD-10-CM | POA: Diagnosis not present

## 2021-12-18 DIAGNOSIS — J449 Chronic obstructive pulmonary disease, unspecified: Secondary | ICD-10-CM | POA: Diagnosis not present

## 2021-12-18 DIAGNOSIS — I110 Hypertensive heart disease with heart failure: Secondary | ICD-10-CM | POA: Diagnosis not present

## 2021-12-18 DIAGNOSIS — I351 Nonrheumatic aortic (valve) insufficiency: Secondary | ICD-10-CM | POA: Diagnosis not present

## 2021-12-18 DIAGNOSIS — K9189 Other postprocedural complications and disorders of digestive system: Secondary | ICD-10-CM | POA: Diagnosis not present

## 2021-12-18 DIAGNOSIS — I5043 Acute on chronic combined systolic (congestive) and diastolic (congestive) heart failure: Secondary | ICD-10-CM | POA: Diagnosis not present

## 2021-12-18 DIAGNOSIS — I2510 Atherosclerotic heart disease of native coronary artery without angina pectoris: Secondary | ICD-10-CM | POA: Diagnosis not present

## 2021-12-18 DIAGNOSIS — I252 Old myocardial infarction: Secondary | ICD-10-CM | POA: Diagnosis not present

## 2021-12-18 DIAGNOSIS — I480 Paroxysmal atrial fibrillation: Secondary | ICD-10-CM | POA: Diagnosis not present

## 2021-12-18 DIAGNOSIS — Z7982 Long term (current) use of aspirin: Secondary | ICD-10-CM | POA: Diagnosis not present

## 2021-12-18 DIAGNOSIS — R7303 Prediabetes: Secondary | ICD-10-CM | POA: Diagnosis not present

## 2021-12-18 DIAGNOSIS — R1314 Dysphagia, pharyngoesophageal phase: Secondary | ICD-10-CM | POA: Diagnosis not present

## 2021-12-18 DIAGNOSIS — Z7984 Long term (current) use of oral hypoglycemic drugs: Secondary | ICD-10-CM | POA: Diagnosis not present

## 2021-12-18 DIAGNOSIS — Z7951 Long term (current) use of inhaled steroids: Secondary | ICD-10-CM | POA: Diagnosis not present

## 2021-12-18 DIAGNOSIS — E871 Hypo-osmolality and hyponatremia: Secondary | ICD-10-CM | POA: Diagnosis not present

## 2021-12-18 DIAGNOSIS — I3139 Other pericardial effusion (noninflammatory): Secondary | ICD-10-CM | POA: Diagnosis not present

## 2021-12-18 DIAGNOSIS — K219 Gastro-esophageal reflux disease without esophagitis: Secondary | ICD-10-CM | POA: Diagnosis not present

## 2021-12-18 DIAGNOSIS — H409 Unspecified glaucoma: Secondary | ICD-10-CM | POA: Diagnosis not present

## 2021-12-18 DIAGNOSIS — D62 Acute posthemorrhagic anemia: Secondary | ICD-10-CM | POA: Diagnosis not present

## 2021-12-18 DIAGNOSIS — I4892 Unspecified atrial flutter: Secondary | ICD-10-CM | POA: Diagnosis not present

## 2021-12-18 DIAGNOSIS — Z7901 Long term (current) use of anticoagulants: Secondary | ICD-10-CM | POA: Diagnosis not present

## 2021-12-18 DIAGNOSIS — M1990 Unspecified osteoarthritis, unspecified site: Secondary | ICD-10-CM | POA: Diagnosis not present

## 2021-12-18 DIAGNOSIS — L89152 Pressure ulcer of sacral region, stage 2: Secondary | ICD-10-CM | POA: Diagnosis not present

## 2021-12-18 DIAGNOSIS — Z48812 Encounter for surgical aftercare following surgery on the circulatory system: Secondary | ICD-10-CM | POA: Diagnosis not present

## 2021-12-19 DIAGNOSIS — M1990 Unspecified osteoarthritis, unspecified site: Secondary | ICD-10-CM | POA: Diagnosis not present

## 2021-12-19 DIAGNOSIS — K9189 Other postprocedural complications and disorders of digestive system: Secondary | ICD-10-CM | POA: Diagnosis not present

## 2021-12-19 DIAGNOSIS — I252 Old myocardial infarction: Secondary | ICD-10-CM | POA: Diagnosis not present

## 2021-12-19 DIAGNOSIS — I4892 Unspecified atrial flutter: Secondary | ICD-10-CM | POA: Diagnosis not present

## 2021-12-19 DIAGNOSIS — I5043 Acute on chronic combined systolic (congestive) and diastolic (congestive) heart failure: Secondary | ICD-10-CM | POA: Diagnosis not present

## 2021-12-19 DIAGNOSIS — I351 Nonrheumatic aortic (valve) insufficiency: Secondary | ICD-10-CM | POA: Diagnosis not present

## 2021-12-19 DIAGNOSIS — Z7984 Long term (current) use of oral hypoglycemic drugs: Secondary | ICD-10-CM | POA: Diagnosis not present

## 2021-12-19 DIAGNOSIS — R1314 Dysphagia, pharyngoesophageal phase: Secondary | ICD-10-CM | POA: Diagnosis not present

## 2021-12-19 DIAGNOSIS — I110 Hypertensive heart disease with heart failure: Secondary | ICD-10-CM | POA: Diagnosis not present

## 2021-12-19 DIAGNOSIS — Z48812 Encounter for surgical aftercare following surgery on the circulatory system: Secondary | ICD-10-CM | POA: Diagnosis not present

## 2021-12-19 DIAGNOSIS — E871 Hypo-osmolality and hyponatremia: Secondary | ICD-10-CM | POA: Diagnosis not present

## 2021-12-19 DIAGNOSIS — H409 Unspecified glaucoma: Secondary | ICD-10-CM | POA: Diagnosis not present

## 2021-12-19 DIAGNOSIS — I480 Paroxysmal atrial fibrillation: Secondary | ICD-10-CM | POA: Diagnosis not present

## 2021-12-19 DIAGNOSIS — I3139 Other pericardial effusion (noninflammatory): Secondary | ICD-10-CM | POA: Diagnosis not present

## 2021-12-19 DIAGNOSIS — Z7982 Long term (current) use of aspirin: Secondary | ICD-10-CM | POA: Diagnosis not present

## 2021-12-19 DIAGNOSIS — Z7951 Long term (current) use of inhaled steroids: Secondary | ICD-10-CM | POA: Diagnosis not present

## 2021-12-19 DIAGNOSIS — E785 Hyperlipidemia, unspecified: Secondary | ICD-10-CM | POA: Diagnosis not present

## 2021-12-19 DIAGNOSIS — L89152 Pressure ulcer of sacral region, stage 2: Secondary | ICD-10-CM | POA: Diagnosis not present

## 2021-12-19 DIAGNOSIS — I2510 Atherosclerotic heart disease of native coronary artery without angina pectoris: Secondary | ICD-10-CM | POA: Diagnosis not present

## 2021-12-19 DIAGNOSIS — J449 Chronic obstructive pulmonary disease, unspecified: Secondary | ICD-10-CM | POA: Diagnosis not present

## 2021-12-19 DIAGNOSIS — D62 Acute posthemorrhagic anemia: Secondary | ICD-10-CM | POA: Diagnosis not present

## 2021-12-19 DIAGNOSIS — K219 Gastro-esophageal reflux disease without esophagitis: Secondary | ICD-10-CM | POA: Diagnosis not present

## 2021-12-19 DIAGNOSIS — R7303 Prediabetes: Secondary | ICD-10-CM | POA: Diagnosis not present

## 2021-12-19 DIAGNOSIS — Z7901 Long term (current) use of anticoagulants: Secondary | ICD-10-CM | POA: Diagnosis not present

## 2021-12-24 ENCOUNTER — Ambulatory Visit: Payer: Medicare HMO

## 2021-12-24 DIAGNOSIS — Z7901 Long term (current) use of anticoagulants: Secondary | ICD-10-CM | POA: Diagnosis not present

## 2021-12-24 DIAGNOSIS — Z952 Presence of prosthetic heart valve: Secondary | ICD-10-CM | POA: Diagnosis not present

## 2021-12-24 LAB — POCT INR: INR: 1.9 — AB (ref 2.0–3.0)

## 2021-12-24 NOTE — Patient Instructions (Signed)
Description   Take 3.5 tablets today and then START taking 3 tablets (3mg ) daily. Recheck INR in 1 week. Coumadin Clinic [phone]

## 2021-12-25 DIAGNOSIS — H2513 Age-related nuclear cataract, bilateral: Secondary | ICD-10-CM | POA: Diagnosis not present

## 2021-12-25 DIAGNOSIS — H401131 Primary open-angle glaucoma, bilateral, mild stage: Secondary | ICD-10-CM | POA: Diagnosis not present

## 2021-12-26 ENCOUNTER — Ambulatory Visit (HOSPITAL_COMMUNITY)
Admission: RE | Admit: 2021-12-26 | Discharge: 2021-12-26 | Disposition: A | Discharge status: Home / Self Care | Payer: Medicare HMO | Source: Ambulatory Visit | Attending: Internal Medicine | Admitting: Internal Medicine

## 2021-12-26 VITALS — BP 104/60 | HR 65 | Wt 189.4 lb

## 2021-12-26 DIAGNOSIS — I252 Old myocardial infarction: Secondary | ICD-10-CM | POA: Insufficient documentation

## 2021-12-26 DIAGNOSIS — Z79899 Other long term (current) drug therapy: Secondary | ICD-10-CM | POA: Insufficient documentation

## 2021-12-26 DIAGNOSIS — I080 Rheumatic disorders of both mitral and aortic valves: Secondary | ICD-10-CM | POA: Diagnosis not present

## 2021-12-26 DIAGNOSIS — I3139 Other pericardial effusion (noninflammatory): Secondary | ICD-10-CM | POA: Diagnosis not present

## 2021-12-26 DIAGNOSIS — Z951 Presence of aortocoronary bypass graft: Secondary | ICD-10-CM | POA: Diagnosis not present

## 2021-12-26 DIAGNOSIS — J449 Chronic obstructive pulmonary disease, unspecified: Secondary | ICD-10-CM | POA: Insufficient documentation

## 2021-12-26 DIAGNOSIS — R7303 Prediabetes: Secondary | ICD-10-CM | POA: Diagnosis not present

## 2021-12-26 DIAGNOSIS — K222 Esophageal obstruction: Secondary | ICD-10-CM | POA: Insufficient documentation

## 2021-12-26 DIAGNOSIS — Z7984 Long term (current) use of oral hypoglycemic drugs: Secondary | ICD-10-CM | POA: Insufficient documentation

## 2021-12-26 DIAGNOSIS — I2510 Atherosclerotic heart disease of native coronary artery without angina pectoris: Secondary | ICD-10-CM | POA: Insufficient documentation

## 2021-12-26 DIAGNOSIS — I480 Paroxysmal atrial fibrillation: Secondary | ICD-10-CM | POA: Insufficient documentation

## 2021-12-26 DIAGNOSIS — I5022 Chronic systolic (congestive) heart failure: Secondary | ICD-10-CM | POA: Diagnosis not present

## 2021-12-26 DIAGNOSIS — R1310 Dysphagia, unspecified: Secondary | ICD-10-CM | POA: Diagnosis not present

## 2021-12-26 DIAGNOSIS — K567 Ileus, unspecified: Secondary | ICD-10-CM | POA: Insufficient documentation

## 2021-12-26 DIAGNOSIS — K9189 Other postprocedural complications and disorders of digestive system: Secondary | ICD-10-CM | POA: Diagnosis not present

## 2021-12-26 DIAGNOSIS — Z7901 Long term (current) use of anticoagulants: Secondary | ICD-10-CM | POA: Diagnosis not present

## 2021-12-26 NOTE — Patient Instructions (Addendum)
It was a pleasure seeing you today!  MEDICATIONS: -No medication changes today -Call if you have questions about your medications.  NEXT APPOINTMENT: Return to clinic in 3 weeks with [facility]. -We will get labs next visit. Please do not take digoxin until after your clinic visit on 01/14/22 so we can get a digoxin level.   In general, to take care of your heart failure: -Limit your fluid intake to 2 Liters (half-gallon) per day.   -Limit your salt intake to ideally 2-3 grams (2000-3000 mg) per day. -Weigh yourself daily and record, and bring that "weight diary" to your next appointment.  (Weight gain of 2-3 pounds in 1 day typically means fluid weight.) -The medications for your heart are to help your heart and help you live longer.   -Please contact us before stopping any of your heart medications.  Call the clinic at [phone] with questions or to reschedule future appointments.

## 2021-12-27 ENCOUNTER — Other Ambulatory Visit (HOSPITAL_COMMUNITY): Payer: Self-pay

## 2021-12-27 MED ORDER — DAPAGLIFLOZIN PROPANEDIOL 10 MG PO TABS: 10.0000 mg | ORAL_TABLET | Freq: Every day | ORAL | 1 refills | Status: DC

## 2021-12-31 ENCOUNTER — Ambulatory Visit (INDEPENDENT_AMBULATORY_CARE_PROVIDER_SITE_OTHER): Payer: Medicare HMO

## 2021-12-31 DIAGNOSIS — Z951 Presence of aortocoronary bypass graft: Secondary | ICD-10-CM | POA: Diagnosis not present

## 2021-12-31 DIAGNOSIS — Z7901 Long term (current) use of anticoagulants: Secondary | ICD-10-CM | POA: Diagnosis not present

## 2021-12-31 DIAGNOSIS — I252 Old myocardial infarction: Secondary | ICD-10-CM | POA: Diagnosis not present

## 2021-12-31 DIAGNOSIS — Z952 Presence of prosthetic heart valve: Secondary | ICD-10-CM

## 2021-12-31 LAB — POCT INR: INR: 2.3 (ref 2.0–3.0)

## 2021-12-31 NOTE — Patient Instructions (Signed)
Description   Continue taking 3 tablets (3mg ) daily. Recheck INR in 1 week. Coumadin Clinic [phone]

## 2022-01-06 ENCOUNTER — Other Ambulatory Visit (HOSPITAL_COMMUNITY): Payer: Self-pay

## 2022-01-06 ENCOUNTER — Encounter: Payer: Self-pay | Admitting: Nurse Practitioner

## 2022-01-06 ENCOUNTER — Ambulatory Visit (INDEPENDENT_AMBULATORY_CARE_PROVIDER_SITE_OTHER): Payer: Medicare HMO | Admitting: Nurse Practitioner

## 2022-01-06 VITALS — BP 112/58 | HR 78 | Temp 98.0°F | Ht 73.0 in | Wt 189.0 lb

## 2022-01-06 DIAGNOSIS — I252 Old myocardial infarction: Secondary | ICD-10-CM | POA: Diagnosis not present

## 2022-01-06 DIAGNOSIS — I700 Atherosclerosis of aorta: Secondary | ICD-10-CM

## 2022-01-06 DIAGNOSIS — Z952 Presence of prosthetic heart valve: Secondary | ICD-10-CM | POA: Diagnosis not present

## 2022-01-06 DIAGNOSIS — E782 Mixed hyperlipidemia: Secondary | ICD-10-CM

## 2022-01-06 DIAGNOSIS — Z7901 Long term (current) use of anticoagulants: Secondary | ICD-10-CM

## 2022-01-06 DIAGNOSIS — I5022 Chronic systolic (congestive) heart failure: Secondary | ICD-10-CM | POA: Diagnosis not present

## 2022-01-06 DIAGNOSIS — Z951 Presence of aortocoronary bypass graft: Secondary | ICD-10-CM | POA: Diagnosis not present

## 2022-01-06 MED ORDER — DIGOXIN 125 MCG PO TABS: 0.1250 mg | ORAL_TABLET | Freq: Every day | ORAL | 3 refills | Status: DC

## 2022-01-06 MED ORDER — ATORVASTATIN CALCIUM 80 MG PO TABS: 80.0000 mg | ORAL_TABLET | Freq: Every day | ORAL | 3 refills | Status: DC

## 2022-01-06 NOTE — Patient Instructions (Signed)
Continue medications Continue follow-up with cardiology as scheduled Follow-up in 60-months, fasting  Heart Failure Eating Plan Heart failure, also called congestive heart failure, occurs when your heart does not pump blood well enough to meet your body's needs for oxygen-rich blood. Heart failure is a long-term (chronic) condition. Living with heart failure can be challenging. Following your health care provider's instructions about a healthy lifestyle and working with a dietitian to choose the right foods may help to improve your symptoms. An eating plan for someone with heart failure will include changes that limit the intake of salt (sodium) and unhealthy fat. What are tips for following this plan? Reading food labels Check food labels for the amount of sodium per serving. Choose foods that have less than 140 mg (milligrams) of sodium in each serving. Check food labels for the number of calories per serving. This is important if you need to limit your daily calorie intake to lose weight. Check food labels for the serving size. If you eat more than one serving, you will be eating more sodium and calories than what is listed on the label. Look for foods that are labeled as "sodium-free," "very low sodium," or "low sodium." Foods labeled as "reduced sodium" or "lightly salted" may still have more sodium than what is recommended for you. Cooking Avoid adding salt when cooking. Ask your health care provider or dietitian before using salt substitutes. Season food with salt-free seasonings, spices, or herbs. Check the label of seasoning mixes to make sure they do not contain salt. Cook with heart-healthy oils, such as olive, canola, soybean, or sunflower oil. Do not fry foods. Cook foods using low-fat methods, such as baking, boiling, grilling, and broiling. Limit unhealthy fats when cooking by: Removing the skin from poultry, such as chicken. Removing all visible fats from meats. Skimming the fat  off from stews, soups, and gravies before serving them. Meal planning  Limit your intake of: Processed, canned, or prepackaged foods. Foods that are high in trans fat, such as fried foods. Sweets, desserts, sugary drinks, and other foods with added sugar. Full-fat dairy products, such as whole milk. Eat a balanced diet. This may include: 4-5 servings of fruit each day and 4-5 servings of vegetables each day. At each meal, try to fill one-half of your plate with fruits and vegetables. Up to 6-8 servings of whole grains each day. Up to 2 servings of lean meat, poultry, or fish each day. One serving of meat is equal to 3 oz (85 g). This is about the same size as a deck of cards. 2 servings of low-fat dairy each day. Heart-healthy fats. Healthy fats called omega-3 fatty acids are found in foods such as flaxseed and cold-water fish like sardines, salmon, and mackerel. Aim to eat 25-35 g (grams) of fiber a day. Foods that are high in fiber include apples, broccoli, carrots, beans, peas, and whole grains. Do not add salt or condiments that contain salt (such as soy sauce) to foods before eating. When eating at a restaurant, ask that your food be prepared with less salt or no salt, if possible. Try to eat 2 or more vegetarian meals each week. Eat more home-cooked food and eat less restaurant, buffet, and fast food. General information Do not eat more than 2,300 mg of sodium a day. The amount of sodium that is recommended for you may be lower, depending on your condition. Maintain a healthy body weight as directed. Ask your health care provider what a healthy weight is  for you. Check your weight every day. Work with your health care provider and dietitian to make a plan that is right for you to lose weight or maintain your current weight. Limit how much fluid you drink. Ask your health care provider or dietitian how much fluid you can have each day. Limit or avoid alcohol as told by your health care  provider or dietitian. Recommended foods Fruits All fresh, frozen, and canned fruits. Dried fruits, such as raisins, prunes, and cranberries. Vegetables All fresh vegetables. Vegetables that are frozen without sauce or added salt. Low-sodium or sodium-free canned vegetables. Grains Bread with less than 80 mg of sodium per slice. Whole-wheat pasta, quinoa, and brown rice. Oats and oatmeal. Barley. Millet. Grits and cream of wheat. Whole-grain and whole-wheat cold cereal. Meats and other protein foods Lean cuts of meat. Skinless chicken and [place]. Fish with high omega-3 fatty acids, such as salmon, sardines, and other cold-water fishes. Eggs. Dried beans, peas, and edamame. Unsalted nuts and nut butters. Dairy Low-fat or nonfat (skim) milk and dried milk. Rice milk, soy milk, and almond milk. Low-fat or nonfat yogurt. Small amounts of reduced-sodium block cheese. Low-sodium cottage cheese. Fats and oils Olive, canola, soybean, flaxseed, avocado, or sunflower oil. Sweets and desserts Applesauce. Granola bars. Sugar-free pudding and gelatin. Frozen fruit bars. Seasoning and other foods Fresh and dried herbs. Lemon or lime juice. Vinegar. Low-sodium ketchup. Salt-free marinades, salad dressings, sauces, and seasonings. The items listed above may not be a complete list of foods and beverages you can eat. Contact a dietitian for more information. Foods to avoid Fruits Fruits that are dried with sodium-containing preservatives. Vegetables Canned vegetables. Frozen vegetables with sauce or seasonings. Creamed vegetables. [place] fries. Onion rings. Pickled vegetables and sauerkraut. Grains Bread with more than 80 mg of sodium per slice. Hot or cold cereal with more than 140 mg sodium per serving. Salted pretzels and crackers. Prepackaged breadcrumbs. Bagels, croissants, and biscuits. Meats and other protein foods Ribs and chicken wings. Bacon, ham, pepperoni, bologna, salami, and packaged luncheon  meats. Hot dogs, bratwurst, and sausage. Canned meat. Smoked meat and fish. Salted nuts and seeds. Dairy Whole milk, half-and-half, and cream. Buttermilk. Processed cheese, cheese spreads, and cheese curds. Regular cottage cheese. Feta cheese. Shredded cheese. String cheese. Fats and oils Butter, lard, shortening, ghee, and bacon fat. Canned and packaged gravies. Seasoning and other foods Onion salt, garlic salt, table salt, and sea salt. Marinades. Regular salad dressings. Relishes, pickles, and olives. Meat flavorings and tenderizers, and bouillon cubes. Horseradish, ketchup, and mustard. Worcestershire sauce. Teriyaki sauce, soy sauce (including reduced sodium). Hot sauce and Tabasco sauce. Steak sauce, fish sauce, oyster sauce, and cocktail sauce. Taco seasonings. Barbecue sauce. Tartar sauce. The items listed above may not be a complete list of foods and beverages you should avoid. Contact a dietitian for more information. Summary A heart failure eating plan includes changes that limit your intake of sodium and unhealthy fat, and it may help you lose weight or maintain a healthy weight. Your health care provider may also recommend limiting how much fluid you drink. Most people with heart failure should eat no more than 2,300 mg of salt (sodium) a day. The amount of sodium that is recommended for you may be lower, depending on your condition. Contact your health care provider or dietitian before making any major changes to your diet. This information is not intended to replace advice given to you by your health care provider. Make sure you discuss any questions  you have with your health care provider. Document Revised: 01/09/2020 Document Reviewed: 01/09/2020 Elsevier Patient Education  2023 [employer] Bleeding Precautions When on Anticoagulant Therapy, Adult Anticoagulant therapy, also called blood thinner therapy, is medicine that helps to prevent and treat blood clots. The medicine works  by stopping blood clots from forming or growing. Blood clots that form in your blood vessels can be dangerous. They can break loose and travel to the heart, lungs, or brain. This increases the risk of a heart attack, stroke, or blocked lung artery (pulmonary embolism). Anticoagulants also increase the risk of bleeding. It is important to take anticoagulants exactly as told by your health care provider. Why do I need to be on anticoagulant therapy? You may need this medicine if you are at risk of developing a blood clot. Conditions that increase your risk of a blood clot include: Having an irregular heartbeat, such as atrial fibrillation (AF). Having had surgery, such as: A valve replacement. A hip or knee replacement. Having been born with a heart problem (congenital heart disease). Having certain types of cancer. Having certain diseases that can increase blood clotting. Having a high risk of stroke or heart attack. What are the common anticoagulant medicines? There are several types of anticoagulant medicines. They each work in different ways to prevent blood clots. The most common types are: Medicines that you take by mouth (oralmedicines), such as: Warfarin. Direct oral anticoagulants (DOACs), such as: Dabigatran. Apixaban, edoxaban, and rivaroxaban. Injections, such as: Heparin. Enoxaparin or fondaparinux. What do I need to remember while on anticoagulant therapy? Taking anticoagulants You may bleed more easily while taking anticoagulants. Be careful not to injure yourself. Take your medicine at the same time every day. If you forget to take your medicine, take it as soon as you remember. Do not double your dosage of medicine if you miss a whole day. If it is almost time for your next dose, take only that dose and call your health care provider. Do not stop taking your medicine unless your health care provider approves. Stopping the medicine can increase your risk of developing a blood  clot. You may need to have regular blood tests while you are taking anticoagulants. If you are taking warfarin, you will need a blood test to measure the time that it takes your blood to clot (INR test), as told by your health care provider. Taking other medicines Take over-the-counter and prescription medicines only as told by your health care provider. Talk with your health care provider before you take any medicines that contain aspirin or NSAIDs, such as ibuprofen. These medicines increase your risk for dangerous bleeding. Ask your health care provider before you start taking any new medicines, vitamins, herbs, or supplements because they could interfere with your therapy. General instructions If you are pregnant or trying to get pregnant, talk with a health care provider about anticoagulants. Some of these medicines are not safe to take during pregnancy. Tell all health care providers, including your dentist, that you are on anticoagulant therapy. It is especially important to tell providers before you have any surgery, medical procedures, or dental work done. Keep all follow-up visits. This is important. This includes blood tests. What precautions should I take?  Avoid situations that cause bleeding. Consider the following: Use a softer toothbrush. Floss with waxed, not unwaxed, dental floss. Shave with an electric razor, not a blade. Be careful when handling and using sharp objects. Wear gloves while you do yard work. Avoid activities such as  contact sports that may put you at risk for injury. Always wear your seat belt. Prevent falls by: Removing loose rugs and extensions cords from areas where you walk. Using a cane or walker if you need it. Keeping a night-light on at night. Always wear shoes outdoors, and wear non-skid slippers indoors. Be careful when cutting your fingernails and toenails. Discuss ways to manage or avoid constipation with your health care provider. Place  non-skid bath mats in the bathtub. If possible, install grab bars. What other precautions are important if on warfarin therapy? If you are taking a type of anticoagulant called warfarin, make sure you: Work with a Microbiologist to make an Location manager. Do not make any sudden changes to your diet without talking with your health care provider. Do not drink alcohol. It can interfere with your medicine and increase your risk of bleeding. Tell your health care provider if you stop smoking. Your warfarin dose may need to be decreased. Call your health care provider about any medicine changes, including if you start taking antibiotics for an infection, as this may affect your INR level. Get regular blood tests to check your INR level as told by your health care provider. What are some questions to ask my health care provider? What is the best anticoagulant therapy for my condition? How long will I need anticoagulant therapy? What are the side effects of anticoagulant therapy? When should I take my medicine? What should I do if I forget to take it? Will I need to have regular blood tests? Do I need to change my diet? Are there foods or drinks that I should avoid? What activities are safe for me? What should I do if I want to get pregnant? What should I do if I have an illness that causes vomiting or diarrhea? What should I do if I am unable to eat for a few days? Contact a health care provider if: You miss a dose of medicine and are not sure what to do. You have minor bleeding, such as nosebleeds or bleeding gums. You have: Menstrual bleeding that is heavier than normal. Blood in your urine, or urine that is pink or brown. Easy bruising. Black and tarry stool (feces) or bright red stool. A rash or other skin reaction from the medicine. You become pregnant. Get help right away if: You have bleeding that will not stop after 20 minutes of holding pressure. You have a severe headache or  stomachache. You have bloody vomit or you cough up blood, or your vomit looks like coffee grounds. You fall or hit your head. You have sudden numbness or weakness of the face, arm, or leg. You have trouble walking. You have confusion. These symptoms may represent a serious problem that is an emergency. Do not wait to see if the symptoms will go away. Get medical help right away. Call your local emergency services (911 in the [place] Do not drive yourself to the hospital. Summary Anticoagulant therapy, also called blood thinner therapy, is medicine that helps to prevent and treat blood clots. Anticoagulants can increase the risk of bleeding. It is important that you take anticoagulants exactly as told by your health care provider. Get help right away for bleeding that does not stop or if you fall and hit your head. Talk with your health care provider about any precautions that you should take while on anticoagulant therapy. Take over-the-counter and prescription medicines only as told by your health care provider. This information is not intended  to replace advice given to you by your health care provider. Make sure you discuss any questions you have with your health care provider. Document Revised: 07/23/2020 Document Reviewed: 07/23/2020 Elsevier Patient Education  [place].
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
## 2022-01-07 ENCOUNTER — Other Ambulatory Visit (HOSPITAL_COMMUNITY): Payer: Self-pay

## 2022-01-07 ENCOUNTER — Ambulatory Visit (INDEPENDENT_AMBULATORY_CARE_PROVIDER_SITE_OTHER): Payer: Medicare HMO

## 2022-01-07 DIAGNOSIS — Z952 Presence of prosthetic heart valve: Secondary | ICD-10-CM

## 2022-01-07 DIAGNOSIS — Z7901 Long term (current) use of anticoagulants: Secondary | ICD-10-CM

## 2022-01-07 LAB — COMPREHENSIVE METABOLIC PANEL
ALT: 17 IU/L (ref 0–44)
AST: 24 IU/L (ref 0–40)
Albumin/Globulin Ratio: 1.3 (ref 1.2–2.2)
Albumin: 3.9 g/dL (ref 3.8–4.8)
Alkaline Phosphatase: 101 IU/L (ref 44–121)
BUN/Creatinine Ratio: 12 (ref 10–24)
BUN: 16 mg/dL (ref 8–27)
Bilirubin Total: 0.6 mg/dL (ref 0.0–1.2)
CO2: 26 mmol/L (ref 20–29)
Calcium: 9.2 mg/dL (ref 8.6–10.2)
Chloride: 93 mmol/L — ABNORMAL LOW (ref 96–106)
Creatinine, Ser: 1.32 mg/dL — ABNORMAL HIGH (ref 0.76–1.27)
Globulin, Total: 2.9 g/dL (ref 1.5–4.5)
Glucose: 96 mg/dL (ref 70–99)
Potassium: 3.9 mmol/L (ref 3.5–5.2)
Sodium: 139 mmol/L (ref 134–144)
Total Protein: 6.8 g/dL (ref 6.0–8.5)
eGFR: 57 mL/min/{1.73_m2} — ABNORMAL LOW (ref >59)

## 2022-01-07 LAB — CBC WITH DIFFERENTIAL/PLATELET
Basophils Absolute: 0.1 10*3/uL (ref 0.0–0.2)
Basos: 1 %
EOS (ABSOLUTE): 0.4 10*3/uL (ref 0.0–0.4)
Eos: 7 %
Hematocrit: 32.2 % — ABNORMAL LOW (ref 37.5–51.0)
Hemoglobin: 10.3 g/dL — ABNORMAL LOW (ref 13.0–17.7)
Immature Grans (Abs): 0 10*3/uL (ref 0.0–0.1)
Immature Granulocytes: 1 %
Lymphocytes Absolute: 0.7 10*3/uL (ref 0.7–3.1)
Lymphs: 12 %
MCH: 24 pg — ABNORMAL LOW (ref 26.6–33.0)
MCHC: 32 g/dL (ref 31.5–35.7)
MCV: 75 fL — ABNORMAL LOW (ref 79–97)
Monocytes Absolute: 0.6 10*3/uL (ref 0.1–0.9)
Monocytes: 10 %
Neutrophils Absolute: 3.9 10*3/uL (ref 1.4–7.0)
Neutrophils: 69 %
Platelets: 209 10*3/uL (ref 150–450)
RBC: 4.3 x10E6/uL (ref 4.14–5.80)
RDW: 17.1 % — ABNORMAL HIGH (ref 11.6–15.4)
WBC: 5.7 10*3/uL (ref 3.4–10.8)

## 2022-01-07 LAB — LIPID PANEL
Chol/HDL Ratio: 3.6 ratio (ref 0.0–5.0)
Cholesterol, Total: 131 mg/dL (ref 100–199)
HDL: 36 mg/dL — ABNORMAL LOW (ref >39)
LDL Chol Calc (NIH): 79 mg/dL (ref 0–99)
Triglycerides: 79 mg/dL (ref 0–149)
VLDL Cholesterol Cal: 16 mg/dL (ref 5–40)

## 2022-01-07 LAB — POCT INR: INR: 2.9 (ref 2.0–3.0)

## 2022-01-07 LAB — CARDIOVASCULAR RISK ASSESSMENT

## 2022-01-07 NOTE — Patient Instructions (Signed)
Description   Only take 2 tablets today and then continue taking 3 tablets (3mg ) daily. Recheck INR in 1 week. Coumadin Clinic [phone]

## 2022-01-08 DIAGNOSIS — Z952 Presence of prosthetic heart valve: Secondary | ICD-10-CM | POA: Diagnosis not present

## 2022-01-08 DIAGNOSIS — Z955 Presence of coronary angioplasty implant and graft: Secondary | ICD-10-CM | POA: Diagnosis not present

## 2022-01-08 DIAGNOSIS — I252 Old myocardial infarction: Secondary | ICD-10-CM | POA: Diagnosis not present

## 2022-01-08 DIAGNOSIS — Z951 Presence of aortocoronary bypass graft: Secondary | ICD-10-CM | POA: Diagnosis not present

## 2022-01-09 DIAGNOSIS — H401132 Primary open-angle glaucoma, bilateral, moderate stage: Secondary | ICD-10-CM | POA: Diagnosis not present

## 2022-01-09 DIAGNOSIS — H34831 Tributary (branch) retinal vein occlusion, right eye, with macular edema: Secondary | ICD-10-CM | POA: Diagnosis not present

## 2022-01-14 ENCOUNTER — Encounter (HOSPITAL_COMMUNITY): Payer: Self-pay

## 2022-01-14 ENCOUNTER — Telehealth (HOSPITAL_COMMUNITY): Payer: Self-pay | Admitting: Surgery

## 2022-01-14 ENCOUNTER — Ambulatory Visit (HOSPITAL_COMMUNITY)
Admission: RE | Admit: 2022-01-14 | Discharge: 2022-01-14 | Disposition: A | Discharge status: Home / Self Care | Payer: Medicare HMO | Source: Ambulatory Visit | Attending: Cardiology | Admitting: Cardiology

## 2022-01-14 ENCOUNTER — Ambulatory Visit (INDEPENDENT_AMBULATORY_CARE_PROVIDER_SITE_OTHER): Payer: Medicare HMO | Admitting: *Deleted

## 2022-01-14 VITALS — BP 124/76 | HR 69 | Wt 188.8 lb

## 2022-01-14 DIAGNOSIS — I351 Nonrheumatic aortic (valve) insufficiency: Secondary | ICD-10-CM | POA: Insufficient documentation

## 2022-01-14 DIAGNOSIS — R634 Abnormal weight loss: Secondary | ICD-10-CM | POA: Insufficient documentation

## 2022-01-14 DIAGNOSIS — K219 Gastro-esophageal reflux disease without esophagitis: Secondary | ICD-10-CM | POA: Diagnosis not present

## 2022-01-14 DIAGNOSIS — I5022 Chronic systolic (congestive) heart failure: Secondary | ICD-10-CM | POA: Insufficient documentation

## 2022-01-14 DIAGNOSIS — I5042 Chronic combined systolic (congestive) and diastolic (congestive) heart failure: Secondary | ICD-10-CM | POA: Diagnosis not present

## 2022-01-14 DIAGNOSIS — J449 Chronic obstructive pulmonary disease, unspecified: Secondary | ICD-10-CM | POA: Diagnosis not present

## 2022-01-14 DIAGNOSIS — I480 Paroxysmal atrial fibrillation: Secondary | ICD-10-CM | POA: Diagnosis not present

## 2022-01-14 DIAGNOSIS — Z7901 Long term (current) use of anticoagulants: Secondary | ICD-10-CM

## 2022-01-14 DIAGNOSIS — Z952 Presence of prosthetic heart valve: Secondary | ICD-10-CM

## 2022-01-14 DIAGNOSIS — I2510 Atherosclerotic heart disease of native coronary artery without angina pectoris: Secondary | ICD-10-CM | POA: Diagnosis not present

## 2022-01-14 DIAGNOSIS — E785 Hyperlipidemia, unspecified: Secondary | ICD-10-CM | POA: Diagnosis not present

## 2022-01-14 DIAGNOSIS — Z951 Presence of aortocoronary bypass graft: Secondary | ICD-10-CM | POA: Diagnosis not present

## 2022-01-14 DIAGNOSIS — I252 Old myocardial infarction: Secondary | ICD-10-CM | POA: Insufficient documentation

## 2022-01-14 DIAGNOSIS — I340 Nonrheumatic mitral (valve) insufficiency: Secondary | ICD-10-CM | POA: Insufficient documentation

## 2022-01-14 DIAGNOSIS — Z79899 Other long term (current) drug therapy: Secondary | ICD-10-CM | POA: Insufficient documentation

## 2022-01-14 DIAGNOSIS — Z953 Presence of xenogenic heart valve: Secondary | ICD-10-CM | POA: Diagnosis not present

## 2022-01-14 DIAGNOSIS — K222 Esophageal obstruction: Secondary | ICD-10-CM | POA: Insufficient documentation

## 2022-01-14 DIAGNOSIS — I309 Acute pericarditis, unspecified: Secondary | ICD-10-CM | POA: Diagnosis not present

## 2022-01-14 LAB — BASIC METABOLIC PANEL
Anion gap: 9 (ref 5–15)
BUN: 14 mg/dL (ref 8–23)
CO2: 28 mmol/L (ref 22–32)
Calcium: 8.8 mg/dL — ABNORMAL LOW (ref 8.9–10.3)
Chloride: 97 mmol/L — ABNORMAL LOW (ref 98–111)
Creatinine, Ser: 1.44 mg/dL — ABNORMAL HIGH (ref 0.61–1.24)
GFR, Estimated: 51 mL/min — ABNORMAL LOW (ref >60)
Glucose, Bld: 97 mg/dL (ref 70–99)
Potassium: 3.8 mmol/L (ref 3.5–5.1)
Sodium: 134 mmol/L — ABNORMAL LOW (ref 135–145)

## 2022-01-14 LAB — TSH: TSH: 1.605 u[IU]/mL (ref 0.350–4.500)

## 2022-01-14 LAB — DIGOXIN LEVEL: Digoxin Level: 1.9 ng/mL (ref 0.8–2.0)

## 2022-01-14 LAB — PROTIME-INR
INR: 3.1 — ABNORMAL HIGH (ref 0.8–1.2)
Prothrombin Time: 32 seconds — ABNORMAL HIGH (ref 11.4–15.2)

## 2022-01-14 MED ORDER — MIDODRINE HCL 5 MG PO TABS: 5.0000 mg | ORAL_TABLET | Freq: Three times a day (TID) | ORAL | 3 refills | Status: DC

## 2022-01-14 MED ORDER — POTASSIUM CHLORIDE CRYS ER 20 MEQ PO TBCR: 40.0000 meq | EXTENDED_RELEASE_TABLET | Freq: Every day | ORAL | 6 refills | Status: DC

## 2022-01-14 MED ORDER — EZETIMIBE 10 MG PO TABS: 5.0000 mg | ORAL_TABLET | Freq: Every day | ORAL | 3 refills | Status: DC

## 2022-01-14 NOTE — Telephone Encounter (Signed)
-----   Message from Allayne Butcher, [place] sent at 01/14/2022  1:52 PM EDT ----- Dig level elevated. Hold digoxin x 3 days then reduce dose to 0.0625 mg daily and get repeat dig level in 1 week in [place]. Make sure he does not take dose of digoxin the morning of labs draw.

## 2022-01-14 NOTE — Progress Notes (Signed)
ReDS Vest / Clip - 01/14/22 1100       ReDS Vest / Clip   Station Marker C    Ruler Value 29    ReDS Value Range Low volume    ReDS Actual Value 35

## 2022-01-14 NOTE — Telephone Encounter (Signed)
I called patient to review results and recommendations per provider.  He will hold Digoxin beginning tomorrow for 3 days and reduce dose after that to .0625mg  daily.  He will have repeat labwork on Tuesday August 15th in [place].

## 2022-01-14 NOTE — Progress Notes (Signed)
ADVANCED HF CLINIC NOTE   Primary Care: [name], NP HF Cardiologist: Dr. Gala Romney  HPI: Keith Jenkins is a 76 y.o. male with history of COPD, prediabetes, GERD/dysphagia, HLD, and new diagnosis of CAD and systolic heart failure.  Admitted 4/23 with chest pain. Ruled in for inferior STEMI, late presenting. LHC showed total occlusion mRCA and 90% mLAD. RCA managed medically d/t completed infarct.  Plan to optimize medical therapy and arrange LAD intervention at later time. Echo showed EF 20-255%, RWMA, RV moderately reduced, moderate to severe ischemic MR, aortic root 4.3 cm, dilated IVC with estimated RAP 8 mmHg. AHF consulted and GDMT titrated, but limited due to soft blood pressure. CVTS consulted for CABG/MVR, instead of PCI to LAD, due to moderate to severe ischemic MR. Plan to optimize medically and improve strength, then arrange surgery a few weeks later. Discharged home, weight 217 lbs.   Post hospital follow up 5/23, mildly volume up, REDs 44%. Lasix 20 mg daily restarted. Plan to repeat echo in 1-2 weeks and coordinate timing of surgery.  Admitted 10/25/21 with a/c CHF. Stat echo showed EF 30-35%, RV moderately reduced, moderate pericardial effusion with no tamponade. Started on milrinone. Developed AFL and amiodarone gtt started. Underwent CABG x 2 (LIMA-LAD, SVG-RAMUS INT) +MVR, MAZE, LAA Clip. Hospitalization c/b distal esophageal stricture seen on barium swallow. GI consulted and plan for outpatient EGD/dilation. Diuresed with IV lasix. Drips weaned off, GDMT limited due to hypotension and need for midodrine. Discharged home, 202 lbs.  He presents today for routine f/u. Here w/ wife and daughter. Last several visits, med titration has been limited by soft BP.   BP today 124/76. Feels well. Denies CP. No dyspnea. 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 No limitations. Wt down 188 lb today. Reports normal PO intake. ReDs 35%.    Cardiac Studies: - Echo (5/23):  EF 25%, RV moderately reduced, moderate to severe MR  - Echo (4/23): EF 20-255%, RWMA, RV moderately reduced, mioderate to severe ischemic MR, aortic root 4.3 cm, dilated IVC with estimated RAP 8 mmHg  - LHC (5/23): 100% m RCA and 90% m LAD. RCA managed medically d/t completed infarct.   ROS: All systems reviewed and negative except as per HPI.   Past Medical History:  Diagnosis Date   Arthritis    Glaucoma    Current Outpatient Medications  Medication Sig Dispense Refill   albuterol (VENTOLIN HFA) 108 (90 Base) MCG/ACT inhaler Inhale 2 puffs into the lungs every 6 (six) hours as needed for wheezing or shortness of breath. 8 g 0   amiodarone (PACERONE) 200 MG tablet Take 0.5 tablets (100 mg total) by mouth daily. 45 tablet 1   aspirin EC 81 MG tablet Take 1 tablet (81 mg total) by mouth daily. Swallow whole. 90 tablet 1   atorvastatin (LIPITOR) 80 MG tablet Take 1 tablet (80 mg total) by mouth daily. 30 tablet 3   dapagliflozin propanediol (FARXIGA) 10 MG TABS tablet Take 1 tablet (10 mg total) by mouth daily. 90 tablet 1   digoxin (LANOXIN) 0.125 MG tablet Take 1 tablet (0.125 mg total) by mouth daily. 30 tablet 3   escitalopram (LEXAPRO) 10 MG tablet Take 1 tablet (10 mg total) by mouth daily. 30 tablet 2   esomeprazole (NEXIUM) 40 MG capsule Take 40 mg by mouth daily.     Fluticasone-Umeclidin-Vilant (TRELEGY ELLIPTA) 100-62.5-25 MCG/ACT AEPB Inhale 1 puff into the lungs daily. 1 each 2   gabapentin (NEURONTIN)  300 MG capsule Take 1 capsule (300 mg total) by mouth at bedtime. 90 capsule 0   latanoprost (XALATAN) 0.005 % ophthalmic solution Place 1 drop into both eyes at bedtime.     midodrine (PROAMATINE) 10 MG tablet Take 1 tablet (10 mg total) by mouth 3 (three) times daily with meals. 90 tablet 3   nitroGLYCERIN (NITROSTAT) 0.4 MG SL tablet Place 1 tablet (0.4 mg total) under the tongue every 5 (five) minutes as needed for chest pain. 25 tablet 3   OVER THE COUNTER MEDICATION Apply  1 application. topically daily as needed (For back pain). Medication: Magnilife Cream     potassium chloride SA (KLOR-CON M) 20 MEQ tablet Take 2 tablets (40 mEq total) by mouth daily. 30 tablet 3   spironolactone (ALDACTONE) 25 MG tablet Take 0.5 tablets (12.5 mg total) by mouth daily. 60 tablet 3   torsemide (DEMADEX) 20 MG tablet Take 2 tablets (40 mg total) by mouth daily. 60 tablet 3   warfarin (COUMADIN) 1 MG tablet Take 2 tablets by mouth daily except 3 tablets on Monday, Wednesday, and Friday or as directed by Anticoagulation Clinic. (Patient taking differently: Take 3 tablets daily. (3mg  daily)) 70 tablet 1   No current facility-administered medications for this encounter.   No Known Allergies  Social History   Socioeconomic History   Marital status: Married    Spouse name: Not on file   Number of children: Not on file   Years of education: Not on file   Highest education level: Not on file  Occupational History   Not on file  Tobacco Use   Smoking status: Former    Types: Cigarettes    Quit date: 1970    Years since quitting: 53.6   Smokeless tobacco: Never  Substance and Sexual Activity   Alcohol use: Not Currently   Drug use: Never   Sexual activity: Not on file  Other Topics Concern   Not on file  Social History Narrative   Not on file   Social Determinants of Health   Financial Resource Strain: Not on file  Food Insecurity: Not on file  Transportation Needs: Not on file  Physical Activity: Not on file  Stress: Not on file  Social Connections: Not on file  Intimate Partner Violence: Not on file   Family History  Problem Relation Age of Onset   Heart attack Mother    Hypertension Mother    Heart attack Father    Hypertension Father    Stomach cancer Sister    Brain cancer Sister    BP 124/76   Pulse 69   Wt 85.6 kg (188 lb 12.8 oz)   SpO2 99%   BMI 24.91 kg/m   Wt Readings from Last 3 Encounters:  01/14/22 85.6 kg (188 lb 12.8 oz)  01/07/22  85.7 kg (189 lb)  12/26/21 85.9 kg (189 lb 6.4 oz)   PHYSICAL EXAM: ReDS 35%  General:  Well appearing. No respiratory difficulty HEENT: normal Neck: supple. no JVD. Carotids 2+ bilat; no bruits. No lymphadenopathy or thyromegaly appreciated. Cor: PMI nondisplaced. Regular rate & rhythm. No rubs, gallops or murmurs. Lungs: clear Abdomen: soft, nontender, nondistended. No hepatosplenomegaly. No bruits or masses. Good bowel sounds. Extremities: no cyanosis, clubbing, rash, edema Neuro: alert & oriented x 3, cranial nerves grossly intact. moves all 4 extremities w/o difficulty. Affect pleasant.   ECG: Not performed   ASSESSMENT & PLAN: 1. CAD/recent late presentation inferior STEMI - LHC 05/04: 100% m RCA  and 90% m LAD.  - RCA managed medically d/t completed infarct.  - Echo (5/23) EF 25%, RV moderately reduced, moderate to severe MR - s/p CABG x 2 (LIMA-LAD, SVG-[place] INT)  (11/01/21) - Stable w/o CP  - Continue ASA + high intensity statin.  - Enrolled in cardiac rehab in [place]    2. Chronic systolic CHF with low-output/ICM - s/p late presentation inferior STEMI 05/23 - Echo (5/23): EF 25-30%, RV moderately reduced, moderate to severe MR. - Limited echo on admit (5/23): EF 30-35%, RV moderately reduced, MR not well evaluated, moderate pericardial effusion - S/p CABG + bioprosthetic MVR 11/01/21 - NYHA Class II. Euvolemic on exam. ReDS 35% - Continue torsemide 40 mg daily. - Continue Farxiga 10 mg daily.  - Reduce midodrine to 5 mg tid - No room for arb/bb while on midodrine, add back as able.  - Continue digoxin 0.125.  Check Dig level today  - Continue spiro 12.5 mg daily.  - BMP today    3. Mitral regurgitation - Moderate to severe ischemic MR on TEE.  - S/p bioprosthetic MVR 11/02/21. - On warfarin per TCTS. INR followed by [facility].    4. Aortic valve insufficiency - Mild to moderate AS/AI - Stable.    5. Pericardial effusion/Acute Pericarditis  -  Moderate in size on echo, no respiratory variation or RV collapse to suggest tamponade. - Acute pericarditis/ adhensions noted at time of CABG. - No chest pain.   6. PAF - s/p MAZE w/ CABG  - RRR on exam today  - Continue amiodarone 100 mg daily. LFTs ok 7/23. Check TSH today. Needs annual eye exams  - On warfarin. Followed by coumadin clinic   7. Esophageal stricture - also has h/o esophageal stricture (was unable to pass TEE probe transgastric prior to CABG) - Barium swallow with distal stricture.  -  Has GI consultation later this month to discuss EGD with dilation.   8. HLD - On atorvastatin 80 - LDL 129>>79 on most recent check  - LDL goal < 55 - Add Zetia 5 mg daily  - Repeat FLP and HFTs in 6 wks. If not at goal, refer to lipid clinic for [id number]   9. Wt Loss - 217>>202>>188 lb today  - he thinks caloric. Not volume depleted on exam  - has h/o anemia and has never had colonoscopy. I recommended that he discuss w/ Dr. Rhea Belton at GI consultation 7/29   F/u w/ APP in 4 weeks. He will need eventual repeat echo but would like to wait until he is on better GDMT. He will need to come off midodrine before we can advance regimen. Beginning wean today.   Robbie Lis, PA-C  01/14/22

## 2022-01-14 NOTE — Patient Instructions (Signed)
Description   Only take 2 tablets today and then continue taking 3 tablets (3mg ) daily. Recheck INR in 1 week. Coumadin Clinic [phone]

## 2022-01-14 NOTE — Patient Instructions (Signed)
Medication Changes:  DECREASE Midodrine to 5 mg Three times a day   START Zetia 5 mg (1/2 tab) Daily  Lab Work:  Labs done today, your results will be available in MyChart, we will contact you for abnormal readings.  Testing/Procedures:  none  Referrals:  none  Special Instructions // Education:  Do the following things EVERYDAY: Weigh yourself in the morning before breakfast. Write it down and keep it in a log. Take your medicines as prescribed Eat low salt foods--Limit salt (sodium) to 2000 mg per day.  Stay as active as you can everyday Limit all fluids for the day to less than 2 liters   Follow-Up in: 4 weeks  At the [facility], you and your health needs are our priority. We have a designated team specialized in the treatment of Heart Failure. This Care Team includes your primary Heart Failure Specialized Cardiologist (physician), Advanced Practice Providers (APPs- Physician Assistants and Nurse Practitioners), and Pharmacist who all work together to provide you with the care you need, when you need it.   You may see any of the following providers on your designated Care Team at your next follow up:  Dr Arvilla Meres Dr Carron Curie, NP Robbie Lis, [place] [facility] [place], [place] Karle Plumber, PharmD   Please be sure to bring in all your medications bottles to every appointment.   Need to Contact us:  If you have any questions or concerns before your next appointment please send [place] a message through [place] or call our office at [phone].    TO LEAVE A MESSAGE FOR THE NURSE SELECT OPTION 2, PLEASE LEAVE A MESSAGE INCLUDING: YOUR NAME DATE OF BIRTH CALL BACK NUMBER REASON FOR CALL**this is important as we prioritize the call backs  YOU WILL RECEIVE A CALL BACK THE SAME DAY AS LONG AS YOU CALL BEFORE 4:00 PM

## 2022-01-15 ENCOUNTER — Telehealth (HOSPITAL_COMMUNITY): Payer: Self-pay | Admitting: *Deleted

## 2022-01-15 DIAGNOSIS — I252 Old myocardial infarction: Secondary | ICD-10-CM | POA: Diagnosis not present

## 2022-01-15 DIAGNOSIS — Z955 Presence of coronary angioplasty implant and graft: Secondary | ICD-10-CM | POA: Diagnosis not present

## 2022-01-15 DIAGNOSIS — Z951 Presence of aortocoronary bypass graft: Secondary | ICD-10-CM | POA: Diagnosis not present

## 2022-01-15 DIAGNOSIS — Z952 Presence of prosthetic heart valve: Secondary | ICD-10-CM | POA: Diagnosis not present

## 2022-01-15 NOTE — Telephone Encounter (Signed)
Med list faxed to [phone] as requested

## 2022-01-17 DIAGNOSIS — Z952 Presence of prosthetic heart valve: Secondary | ICD-10-CM | POA: Diagnosis not present

## 2022-01-17 DIAGNOSIS — Z951 Presence of aortocoronary bypass graft: Secondary | ICD-10-CM | POA: Diagnosis not present

## 2022-01-17 DIAGNOSIS — Z955 Presence of coronary angioplasty implant and graft: Secondary | ICD-10-CM | POA: Diagnosis not present

## 2022-01-17 DIAGNOSIS — I252 Old myocardial infarction: Secondary | ICD-10-CM | POA: Diagnosis not present

## 2022-01-18 ENCOUNTER — Other Ambulatory Visit: Payer: Self-pay | Admitting: Nurse Practitioner

## 2022-01-18 DIAGNOSIS — M5441 Lumbago with sciatica, right side: Secondary | ICD-10-CM

## 2022-01-20 DIAGNOSIS — Z955 Presence of coronary angioplasty implant and graft: Secondary | ICD-10-CM | POA: Diagnosis not present

## 2022-01-20 DIAGNOSIS — Z951 Presence of aortocoronary bypass graft: Secondary | ICD-10-CM | POA: Diagnosis not present

## 2022-01-20 DIAGNOSIS — Z952 Presence of prosthetic heart valve: Secondary | ICD-10-CM | POA: Diagnosis not present

## 2022-01-20 DIAGNOSIS — I252 Old myocardial infarction: Secondary | ICD-10-CM | POA: Diagnosis not present

## 2022-01-20 MED ORDER — GABAPENTIN 300 MG PO CAPS: 300.0000 mg | ORAL_CAPSULE | Freq: Every day | ORAL | 0 refills | Status: DC

## 2022-01-21 ENCOUNTER — Other Ambulatory Visit: Payer: Self-pay | Admitting: Surgery

## 2022-01-21 ENCOUNTER — Ambulatory Visit (INDEPENDENT_AMBULATORY_CARE_PROVIDER_SITE_OTHER): Payer: Medicare HMO

## 2022-01-21 DIAGNOSIS — Z952 Presence of prosthetic heart valve: Secondary | ICD-10-CM

## 2022-01-21 DIAGNOSIS — Z7901 Long term (current) use of anticoagulants: Secondary | ICD-10-CM

## 2022-01-21 DIAGNOSIS — I5022 Chronic systolic (congestive) heart failure: Secondary | ICD-10-CM | POA: Diagnosis not present

## 2022-01-21 LAB — POCT INR: INR: 2.3 (ref 2.0–3.0)

## 2022-01-21 NOTE — Patient Instructions (Signed)
Description   Continue taking 3 tablets (3mg ) daily. Recheck INR in 2 weeks. Coumadin Clinic [phone]

## 2022-01-22 ENCOUNTER — Ambulatory Visit (INDEPENDENT_AMBULATORY_CARE_PROVIDER_SITE_OTHER): Payer: Self-pay | Admitting: Surgery

## 2022-01-22 ENCOUNTER — Other Ambulatory Visit: Payer: Self-pay

## 2022-01-22 ENCOUNTER — Ambulatory Visit
Admission: RE | Admit: 2022-01-22 | Discharge: 2022-01-22 | Disposition: A | Discharge status: Home / Self Care | Payer: Medicare HMO | Source: Ambulatory Visit | Attending: Surgery | Admitting: Surgery

## 2022-01-22 ENCOUNTER — Encounter: Payer: Self-pay | Admitting: Surgery

## 2022-01-22 VITALS — BP 113/62 | HR 73 | Resp 20 | Ht 73.0 in | Wt 190.0 lb

## 2022-01-22 DIAGNOSIS — Z9889 Other specified postprocedural states: Secondary | ICD-10-CM | POA: Diagnosis not present

## 2022-01-22 DIAGNOSIS — Z952 Presence of prosthetic heart valve: Secondary | ICD-10-CM

## 2022-01-22 DIAGNOSIS — M5441 Lumbago with sciatica, right side: Secondary | ICD-10-CM

## 2022-01-22 DIAGNOSIS — I517 Cardiomegaly: Secondary | ICD-10-CM | POA: Diagnosis not present

## 2022-01-22 LAB — DIGOXIN LEVEL: Digoxin, Serum: 0.7 ng/mL (ref 0.5–0.9)

## 2022-01-22 MED ORDER — GABAPENTIN 300 MG PO CAPS: 300.0000 mg | ORAL_CAPSULE | Freq: Every day | ORAL | 0 refills | Status: DC

## 2022-01-22 NOTE — Progress Notes (Signed)
HPI:  Keith Jenkins returns today for follow-up status post CABG x2, biatrial Maze procedure with clipping of left atrial appendage, and a chordal-sparing mitral valve replacement with a 27 mm MITRIS Resilia pericardial valve on 11/01/2021.  He was admitted with a late presenting inferior STEMI with ejection fraction of 25 to 30% by echocardiogram, moderate to severe ischemic mitral regurgitation, and moderate RV systolic dysfunction.  The aortic valve was thickened with some calcification and immobility of left coronary leaflet with a mean gradient of only 6 mmHg but the stroke-volume index was low at 19.  He had mild to moderate AI.  Cardiac catheterization showed mid RCA occlusion that was felt to be the culprit.  There is also a 90% mid LAD stenosis at the origin of a large septal perforator.  He was tuned up and sent home on a diuretic with plans for outpatient surgery in a few weeks but returned with worsening congestive heart failure and angina with weight gain, bilateral pleural effusions, and a markedly elevated proBNP of 6000.  He was started on milrinone 0.25 and due to a Co-ox of 58%.  He had recurrent angina and was started on heparin and nitroglycerin.  He also had some atrial fibrillation and converted to sinus on amiodarone.   He had a slow postoperative course due to chronic deconditioning and biventricular dysfunction.  He remained on milrinone and norepinephrine to support his blood pressure.  The milrinone was weaned off and he was transition to midodrine.  He was diuresed with IV Lasix.  GDMT was limited due to borderline blood pressure.   He has a long history of dysphagia which made p.o. intake difficult initially postoperatively.  He had an esophagram which showed a distal esophageal stricture that was tight.  He was eventually started on a liquid diet and advance to soft food which she has tolerated well.  Since I last saw him on 12/05/2021 he has continued to improve.  He has been  walking daily without chest pain or shortness of breath and his stamina is increasing.  His lower extremity edema has been decreasing.  He is here today with his family.  Current Outpatient Medications  Medication Sig Dispense Refill   albuterol (VENTOLIN HFA) 108 (90 Base) MCG/ACT inhaler Inhale 2 puffs into the lungs every 6 (six) hours as needed for wheezing or shortness of breath. 8 g 0   amiodarone (PACERONE) 200 MG tablet Take 0.5 tablets (100 mg total) by mouth daily. 45 tablet 1   aspirin EC 81 MG tablet Take 1 tablet (81 mg total) by mouth daily. Swallow whole. 90 tablet 1   atorvastatin (LIPITOR) 80 MG tablet Take 1 tablet (80 mg total) by mouth daily. 30 tablet 3   dapagliflozin propanediol (FARXIGA) 10 MG TABS tablet Take 1 tablet (10 mg total) by mouth daily. 90 tablet 1   digoxin (LANOXIN) 0.125 MG tablet Take 1 tablet (0.125 mg total) by mouth daily. 30 tablet 3   escitalopram (LEXAPRO) 10 MG tablet Take 1 tablet (10 mg total) by mouth daily. 30 tablet 2   esomeprazole (NEXIUM) 40 MG capsule Take 40 mg by mouth daily.     ezetimibe (ZETIA) 10 MG tablet Take 0.5 tablets (5 mg total) by mouth daily. 15 tablet 3   Fluticasone-Umeclidin-Vilant (TRELEGY ELLIPTA) 100-62.5-25 MCG/ACT AEPB Inhale 1 puff into the lungs daily. 1 each 2   gabapentin (NEURONTIN) 300 MG capsule Take 1 capsule (300 mg total) by mouth at bedtime. 90 capsule  0   latanoprost (XALATAN) 0.005 % ophthalmic solution Place 1 drop into both eyes at bedtime.     midodrine (PROAMATINE) 5 MG tablet Take 1 tablet (5 mg total) by mouth 3 (three) times daily with meals. 90 tablet 3   nitroGLYCERIN (NITROSTAT) 0.4 MG SL tablet Place 1 tablet (0.4 mg total) under the tongue every 5 (five) minutes as needed for chest pain. 25 tablet 3   OVER THE COUNTER MEDICATION Apply 1 application. topically daily as needed (For back pain). Medication: Magnilife Cream     potassium chloride SA (KLOR-CON M) 20 MEQ tablet Take 2 tablets (40 mEq  total) by mouth daily. 30 tablet 6   spironolactone (ALDACTONE) 25 MG tablet Take 0.5 tablets (12.5 mg total) by mouth daily. 60 tablet 3   torsemide (DEMADEX) 20 MG tablet Take 2 tablets (40 mg total) by mouth daily. 60 tablet 3   warfarin (COUMADIN) 1 MG tablet Take 2 tablets by mouth daily except 3 tablets on Monday, Wednesday, and Friday or as directed by Anticoagulation Clinic. (Patient taking differently: Take 3 tablets daily. (3mg  daily)) 70 tablet 1   No current facility-administered medications for this visit.     Physical Exam: BP 113/62   Pulse 73   Resp 20   Ht 6\' 1"  (1.854 m)   Wt 190 lb (86.2 kg)   SpO2 92% Comment: RA  BMI 25.07 kg/m  He looks well. Cardiac exam shows regular rate and rhythm with normal heart sounds.  There is no murmur. Lung exam is clear. Chest incision is well-healed and the sternum is stable. He has mild bilateral lower extremity edema.  Diagnostic Tests:  Narrative & Impression  CLINICAL DATA:  76 year old male status post open heart surgery in May with CABG and mitral valve replacement.   EXAM: CHEST - 2 VIEW   COMPARISON:  Chest radiograph 12/05/2021 and earlier.   FINDINGS: PA and lateral views of the chest. Stable postoperative appearance of the mediastinum. Borderline to mild cardiomegaly. Regressed but not resolved bilateral pleural effusions, now small and still left greater than right. Apical lung scarring suspected and likely chronic. Stable pulmonary vascularity, no overt edema. No air bronchograms.   No acute osseous abnormality identified. Negative visible bowel gas.   IMPRESSION: 1. Regressed but not resolved bilateral pleural effusions, now small and still left > right. 2. No new cardiopulmonary abnormality.     Electronically Signed   By: June M.D.   On: 01/22/2022 10:01      Impression:  Overall he continues to make progress almost 3 months out from his surgery.  His peripheral edema is improving and  his bilateral pleural effusions have significantly decreased and are very small.  He has been followed closely in the heart failure clinic.  His digoxin level was 1.9 on 01/14/2022 and it was held for a few days and resumed at 0.0625 mg daily since his follow-up level was 0.9 on 01/21/2022.  I encouraged him to continue walking as much as possible.  He will be 3 months postoperatively and about 2 weeks and then he can return to normal physical activity as tolerated.  Plan:  He will continue to follow-up with the heart failure clinic.  The heart failure team can decide when to stop his amiodarone and possibly Coumadin.  He only requires Coumadin for 3 months for his bioprosthetic mitral valve but that depends on if he maintains sinus rhythm long-term after Maze procedure.   03/16/2022, MD  [facility] [phone]

## 2022-01-24 DIAGNOSIS — Z955 Presence of coronary angioplasty implant and graft: Secondary | ICD-10-CM | POA: Diagnosis not present

## 2022-01-24 DIAGNOSIS — I252 Old myocardial infarction: Secondary | ICD-10-CM | POA: Diagnosis not present

## 2022-01-24 DIAGNOSIS — Z952 Presence of prosthetic heart valve: Secondary | ICD-10-CM | POA: Diagnosis not present

## 2022-01-24 DIAGNOSIS — Z951 Presence of aortocoronary bypass graft: Secondary | ICD-10-CM | POA: Diagnosis not present

## 2022-01-27 ENCOUNTER — Other Ambulatory Visit: Payer: Self-pay | Admitting: Cardiology

## 2022-01-27 DIAGNOSIS — Z952 Presence of prosthetic heart valve: Secondary | ICD-10-CM | POA: Diagnosis not present

## 2022-01-27 DIAGNOSIS — Z955 Presence of coronary angioplasty implant and graft: Secondary | ICD-10-CM | POA: Diagnosis not present

## 2022-01-27 DIAGNOSIS — I252 Old myocardial infarction: Secondary | ICD-10-CM | POA: Diagnosis not present

## 2022-01-27 DIAGNOSIS — Z951 Presence of aortocoronary bypass graft: Secondary | ICD-10-CM | POA: Diagnosis not present

## 2022-01-28 ENCOUNTER — Other Ambulatory Visit: Payer: Self-pay

## 2022-01-28 ENCOUNTER — Emergency Department (HOSPITAL_COMMUNITY)
Admission: EM | Admit: 2022-01-28 | Discharge: 2022-01-28 | Disposition: A | Discharge status: Home / Self Care | Payer: Medicare HMO | Attending: Emergency Medicine | Admitting: Emergency Medicine

## 2022-01-28 ENCOUNTER — Emergency Department (HOSPITAL_COMMUNITY): Payer: Medicare HMO

## 2022-01-28 DIAGNOSIS — W01198A Fall on same level from slipping, tripping and stumbling with subsequent striking against other object, initial encounter: Secondary | ICD-10-CM | POA: Diagnosis not present

## 2022-01-28 DIAGNOSIS — W19XXXA Unspecified fall, initial encounter: Secondary | ICD-10-CM

## 2022-01-28 DIAGNOSIS — S022XXA Fracture of nasal bones, initial encounter for closed fracture: Secondary | ICD-10-CM | POA: Diagnosis not present

## 2022-01-28 DIAGNOSIS — Z23 Encounter for immunization: Secondary | ICD-10-CM | POA: Diagnosis not present

## 2022-01-28 DIAGNOSIS — R609 Edema, unspecified: Secondary | ICD-10-CM | POA: Diagnosis not present

## 2022-01-28 DIAGNOSIS — R42 Dizziness and giddiness: Secondary | ICD-10-CM | POA: Insufficient documentation

## 2022-01-28 DIAGNOSIS — M47812 Spondylosis without myelopathy or radiculopathy, cervical region: Secondary | ICD-10-CM | POA: Diagnosis not present

## 2022-01-28 DIAGNOSIS — Z743 Need for continuous supervision: Secondary | ICD-10-CM | POA: Diagnosis not present

## 2022-01-28 DIAGNOSIS — Z951 Presence of aortocoronary bypass graft: Secondary | ICD-10-CM | POA: Diagnosis not present

## 2022-01-28 DIAGNOSIS — I509 Heart failure, unspecified: Secondary | ICD-10-CM | POA: Diagnosis not present

## 2022-01-28 DIAGNOSIS — Z7982 Long term (current) use of aspirin: Secondary | ICD-10-CM | POA: Insufficient documentation

## 2022-01-28 DIAGNOSIS — J90 Pleural effusion, not elsewhere classified: Secondary | ICD-10-CM | POA: Diagnosis not present

## 2022-01-28 DIAGNOSIS — I959 Hypotension, unspecified: Secondary | ICD-10-CM | POA: Diagnosis not present

## 2022-01-28 DIAGNOSIS — S0990XA Unspecified injury of head, initial encounter: Secondary | ICD-10-CM | POA: Diagnosis not present

## 2022-01-28 LAB — URINALYSIS, ROUTINE W REFLEX MICROSCOPIC
Bilirubin Urine: NEGATIVE
Glucose, UA: 50 mg/dL — AB
Hgb urine dipstick: NEGATIVE
Ketones, ur: NEGATIVE mg/dL
Leukocytes,Ua: NEGATIVE
Nitrite: NEGATIVE
Protein, ur: NEGATIVE mg/dL
Specific Gravity, Urine: 1.009 (ref 1.005–1.030)
pH: 6 (ref 5.0–8.0)

## 2022-01-28 LAB — CBC WITH DIFFERENTIAL/PLATELET
Abs Immature Granulocytes: 0.02 10*3/uL (ref 0.00–0.07)
Basophils Absolute: 0 10*3/uL (ref 0.0–0.1)
Basophils Relative: 1 %
Eosinophils Absolute: 0.4 10*3/uL (ref 0.0–0.5)
Eosinophils Relative: 7 %
HCT: 27 % — ABNORMAL LOW (ref 39.0–52.0)
Hemoglobin: 8.6 g/dL — ABNORMAL LOW (ref 13.0–17.0)
Immature Granulocytes: 0 %
Lymphocytes Relative: 15 %
Lymphs Abs: 0.7 10*3/uL (ref 0.7–4.0)
MCH: 23.5 pg — ABNORMAL LOW (ref 26.0–34.0)
MCHC: 31.9 g/dL (ref 30.0–36.0)
MCV: 73.8 fL — ABNORMAL LOW (ref 80.0–100.0)
Monocytes Absolute: 0.5 10*3/uL (ref 0.1–1.0)
Monocytes Relative: 11 %
Neutro Abs: 3.1 10*3/uL (ref 1.7–7.7)
Neutrophils Relative %: 66 %
Platelets: 242 10*3/uL (ref 150–400)
RBC: 3.66 MIL/uL — ABNORMAL LOW (ref 4.22–5.81)
RDW: 19.5 % — ABNORMAL HIGH (ref 11.5–15.5)
WBC: 4.8 10*3/uL (ref 4.0–10.5)
nRBC: 0 % (ref 0.0–0.2)

## 2022-01-28 LAB — COMPREHENSIVE METABOLIC PANEL
ALT: 29 U/L (ref 0–44)
AST: 34 U/L (ref 15–41)
Albumin: 2.8 g/dL — ABNORMAL LOW (ref 3.5–5.0)
Alkaline Phosphatase: 95 U/L (ref 38–126)
Anion gap: 10 (ref 5–15)
BUN: 18 mg/dL (ref 8–23)
CO2: 24 mmol/L (ref 22–32)
Calcium: 8.4 mg/dL — ABNORMAL LOW (ref 8.9–10.3)
Chloride: 102 mmol/L (ref 98–111)
Creatinine, Ser: 1.51 mg/dL — ABNORMAL HIGH (ref 0.61–1.24)
GFR, Estimated: 48 mL/min — ABNORMAL LOW (ref >60)
Glucose, Bld: 89 mg/dL (ref 70–99)
Potassium: 3.3 mmol/L — ABNORMAL LOW (ref 3.5–5.1)
Sodium: 136 mmol/L (ref 135–145)
Total Bilirubin: 0.8 mg/dL (ref 0.3–1.2)
Total Protein: 6.1 g/dL — ABNORMAL LOW (ref 6.5–8.1)

## 2022-01-28 LAB — PROTIME-INR
INR: 3.1 — ABNORMAL HIGH (ref 0.8–1.2)
Prothrombin Time: 31.6 seconds — ABNORMAL HIGH (ref 11.4–15.2)

## 2022-01-28 LAB — CBG MONITORING, ED: Glucose-Capillary: 99 mg/dL (ref 70–99)

## 2022-01-28 LAB — BRAIN NATRIURETIC PEPTIDE: B Natriuretic Peptide: 1033.4 pg/mL — ABNORMAL HIGH (ref 0.0–100.0)

## 2022-01-28 MED ORDER — TETANUS-DIPHTH-ACELL PERTUSSIS 5-2.5-18.5 LF-MCG/0.5 IM SUSY
0.5000 mL | PREFILLED_SYRINGE | Freq: Once | INTRAMUSCULAR | Status: AC
  Administered 2022-01-28: 0.5 mL via INTRAMUSCULAR
  Filled 2022-01-28: qty 0.5

## 2022-01-28 NOTE — Discharge Instructions (Addendum)
We discussed the results of your imaging at length.  Your tetanus immunization was updated on today's visit.  Please follow-up with your cardiologist in order to obtain better management of your medication.

## 2022-01-28 NOTE — Telephone Encounter (Signed)
Refill request for warfarin:  Last INR was 2.3 on 01/21/22 Next INR due on 02/04/22 Next OV scheduled on 02/14/22  [facility]  Refill approved.
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
## 2022-01-28 NOTE — ED Provider Notes (Signed)
[facility] EMERGENCY DEPARTMENT Provider Note   CSN: [id number] Arrival date & time: 01/28/22  1607     History  Chief Complaint  Patient presents with   [facility] Keith Jenkins is a 76 y.o. male.  76 y.o male with a PMH of CABG, MV replacement presents to the ED via EMS s/p fall. Patient reports he was ambulating outside with his cane when suddenly he became dizzy, reports similar feelings most of the mornings. He reports he had not eaten breakfast yet. States the fall was mostly forward striking his nose along with his right arm. He endorses dizziness has resolved. States pain alone the nose and posterior aspect of his head. Patient found to have soft pressures in the field with systolic's in the 90's and given bolus by EMS. He is currently on warfarin. He denies any LOC, no neck pain or other complaints.   The history is provided by the patient and medical records.  Fall This is a new problem. Pertinent negatives include no chest pain, no abdominal pain and no shortness of breath.       Home Medications Prior to Admission medications   Medication Sig Start Date End Date Taking? Authorizing Provider  albuterol (VENTOLIN HFA) 108 (90 Base) MCG/ACT inhaler Inhale 2 puffs into the lungs every 6 (six) hours as needed for wheezing or shortness of breath. 10/09/21   [name], NP  amiodarone (PACERONE) 200 MG tablet Take 0.5 tablets (100 mg total) by mouth daily. 12/04/21   Jacklynn Ganong, FNP  aspirin EC 81 MG tablet Take 1 tablet (81 mg total) by mouth daily. Swallow whole. 12/04/21   Milford, Anderson Malta, FNP  atorvastatin (LIPITOR) 80 MG tablet Take 1 tablet (80 mg total) by mouth daily. 01/06/22   Bensimhon, Bevelyn Buckles, MD  dapagliflozin propanediol (FARXIGA) 10 MG TABS tablet Take 1 tablet (10 mg total) by mouth daily. 12/27/21   Bensimhon, Bevelyn Buckles, MD  digoxin (LANOXIN) 0.125 MG tablet Take 1 tablet (0.125 mg total) by mouth daily. 01/06/22   Bensimhon,  Bevelyn Buckles, MD  escitalopram (LEXAPRO) 10 MG tablet Take 1 tablet (10 mg total) by mouth daily. 12/05/21   [name], NP  esomeprazole (NEXIUM) 40 MG capsule Take 40 mg by mouth daily.    [provider]  ezetimibe (ZETIA) 10 MG tablet Take 0.5 tablets (5 mg total) by mouth daily. 01/14/22   Robbie Lis M, PA-C  Fluticasone-Umeclidin-Vilant (TRELEGY ELLIPTA) 100-62.5-25 MCG/ACT AEPB Inhale 1 puff into the lungs daily. 12/05/21   [name], NP  gabapentin (NEURONTIN) 300 MG capsule Take 1 capsule (300 mg total) by mouth at bedtime. 01/22/22   Cox, Kirsten, MD  latanoprost (XALATAN) 0.005 % ophthalmic solution Place 1 drop into both eyes at bedtime. 06/10/21   [provider]  midodrine (PROAMATINE) 5 MG tablet Take 1 tablet (5 mg total) by mouth 3 (three) times daily with meals. 01/14/22   Robbie Lis M, PA-C  nitroGLYCERIN (NITROSTAT) 0.4 MG SL tablet Place 1 tablet (0.4 mg total) under the tongue every 5 (five) minutes as needed for chest pain. 12/04/21 03/04/22  Jacklynn Ganong, FNP  OVER THE COUNTER MEDICATION Apply 1 application. topically daily as needed (For back pain). Medication: Magnilife Cream    [provider]  potassium chloride SA (KLOR-CON M) 20 MEQ tablet Take 2 tablets (40 mEq total) by mouth daily. 01/14/22   Robbie Lis M, PA-C  spironolactone (ALDACTONE) 25  MG tablet Take 0.5 tablets (12.5 mg total) by mouth daily. 11/18/21   Barrett, Erin R, PA-C  torsemide (DEMADEX) 20 MG tablet Take 2 tablets (40 mg total) by mouth daily. 12/17/21   Jacklynn Ganong, FNP  warfarin (COUMADIN) 1 MG tablet Take 3 tablets daily. (3mg  daily) 01/28/22   Bensimhon, 01/30/22, MD      Allergies    Patient has no known allergies.    Review of Systems   Review of Systems  Constitutional:  Negative for chills and fever.  HENT:  Negative for sore throat.   Eyes:  Negative for photophobia.  Respiratory:  Negative for shortness of breath.    Cardiovascular:  Negative for chest pain.  Gastrointestinal:  Negative for abdominal pain, nausea and vomiting.  Genitourinary:  Negative for flank pain.  Musculoskeletal:  Negative for back pain.  Skin:  Negative for pallor and wound.  Neurological:  Positive for dizziness. Negative for syncope and weakness.  All other systems reviewed and are negative.   Physical Exam Updated Vital Signs BP 105/70   Pulse 63   Temp (!) 97.2 F (36.2 C) (Oral)   Resp 13   Ht 6\' 1"  (1.854 m)   Wt 83.9 kg   SpO2 95%   BMI 24.41 kg/m  Physical Exam Vitals and nursing note reviewed.  Constitutional:      Appearance: Normal appearance.  HENT:     Head: Normocephalic.     Comments: Multiple abrasions noted to the nose, face, posterior aspect of his head.     Nose: Nasal tenderness present.      Mouth/Throat:     Mouth: Mucous membranes are moist.  Eyes:     Pupils: Pupils are equal, round, and reactive to light.  Neck:     Comments: FULL ROM, refused c collar per EMS.  Cardiovascular:     Rate and Rhythm: Normal rate.  Pulmonary:     Effort: Pulmonary effort is normal.     Breath sounds: No wheezing or rales.  Abdominal:     General: Abdomen is flat.  Musculoskeletal:     Cervical back: Normal range of motion and neck supple.  Skin:    General: Skin is warm and dry.     Findings: Bruising present.     Comments: Abrasions noted to right forearm, bleeding is controlled.   Neurological:     Mental Status: He is alert and oriented to person, place, and time.     ED Results / Procedures / Treatments   Labs (all labs ordered are listed, but only abnormal results are displayed) Labs Reviewed  CBC WITH DIFFERENTIAL/PLATELET - Abnormal; Notable for the following components:      Result Value   RBC 3.66 (*)    Hemoglobin 8.6 (*)    HCT 27.0 (*)    MCV 73.8 (*)    MCH 23.5 (*)    RDW 19.5 (*)    All other components within normal limits  URINALYSIS, ROUTINE W REFLEX MICROSCOPIC -  Abnormal; Notable for the following components:   Glucose, UA 50 (*)    All other components within normal limits  BRAIN NATRIURETIC PEPTIDE - Abnormal; Notable for the following components:   B Natriuretic Peptide 1,033.4 (*)    All other components within normal limits  COMPREHENSIVE METABOLIC PANEL - Abnormal; Notable for the following components:   Potassium 3.3 (*)    Creatinine, Ser 1.51 (*)    Calcium 8.4 (*)    Total  Protein 6.1 (*)    Albumin 2.8 (*)    GFR, Estimated 48 (*)    All other components within normal limits  PROTIME-INR - Abnormal; Notable for the following components:   Prothrombin Time 31.6 (*)    INR 3.1 (*)    All other components within normal limits  CBG MONITORING, ED    EKG None  Radiology DG Chest Portable 1 View  Result Date: 01/28/2022 CLINICAL DATA:  Elevated BNP EXAM: PORTABLE CHEST 1 VIEW COMPARISON:  01/22/2022 FINDINGS: Borderline heart size. Prior median sternotomy with mitral valve replacement and left atrial clipping. Diffuse interstitial opacity with Kerley lines and small left pleural effusion. No pneumothorax. IMPRESSION: CHF. Electronically Signed   By: Tiburcio Pea M.D.   On: 01/28/2022 12:24   CT HEAD WO CONTRAST ( )  Result Date: 01/28/2022 CLINICAL DATA:  Provided history: Head trauma, minor. Head trauma, moderate/severe. Neck trauma. Additional history provided: Fall. EXAM: CT HEAD WITHOUT CONTRAST CT MAXILLOFACIAL WITHOUT CONTRAST CT CERVICAL SPINE WITHOUT CONTRAST TECHNIQUE: Multidetector CT imaging of the head, cervical spine, and maxillofacial structures were performed using the standard protocol without intravenous contrast. Multiplanar CT image reconstructions of the cervical spine and maxillofacial structures were also generated. RADIATION DOSE REDUCTION: This exam was performed according to the departmental dose-optimization program which includes automated exposure control, adjustment of the mA and/or kV according to  patient size and/or use of iterative reconstruction technique. FINDINGS: CT HEAD FINDINGS Brain: Mild generalized parenchymal atrophy. Mild patchy and ill-defined hypoattenuation within the cerebral white matter, nonspecific but compatible with chronic small vessel ischemic disease. There is no acute intracranial hemorrhage. No demarcated cortical infarct. No extra-axial fluid collection. No evidence of an intracranial mass. No midline shift. Vascular: No hyperdense vessel.  Atherosclerotic calcifications. Skull: No fracture or aggressive osseous lesion. No mass or acute finding within the imaged orbits. CT MAXILLOFACIAL FINDINGS Osseous: Comminuted and displaced bilateral nasal bone fractures. There is prominent leftward deviation of the bony nasal septum without definite nasal septal fracture identified. Orbits: No acute orbital finding. Sinuses: Extensive opacification of the right maxillary sinus with associated chronic reactive osteitis. Additionally, a pneumatized septation is present within the right maxillary sinus. Small mucous retention cyst within the left maxillary sinus. Minimal mucosal thickening within the bilateral ethmoid air cells. Soft tissues: Nasal soft tissue swelling and laceration. CT CERVICAL SPINE FINDINGS Alignment: Straightening of the expected cervical lordosis. 2 mm C4-C5 grade 1 anterolisthesis. Skull base and vertebrae: The basion-dental and atlanto-dental intervals are maintained.No evidence of acute fracture to the cervical spine. Facet joint ankylosis bilaterally at C2-C3 and C4-C5. Suspected early osseous fusion across the disc space at C6-C7. Soft tissues and spinal canal: No prevertebral fluid or swelling. No visible canal hematoma. Disc levels: Cervical spondylosis with multilevel disc space narrowing, disc bulges, posterior disc osteophytes, uncovertebral hypertrophy and facet arthrosis. Disc space narrowing is greatest at C5-C6, C6-C7 and C7-T1 (advanced at these levels).  Suspected early osseous fusion across the C6-C7 disc space. Multilevel spinal canal stenosis. Most notably at C5-C6, a posterior disc osteophyte complex contributes to apparent moderate spinal canal stenosis. Multilevel bony neural foraminal narrowing. Upper chest: Smooth interlobular septal thickening within the imaged lung apices compatible with edema. Superimposed partially imaged patchy opacity to within the right lung apex. Partially imaged bilateral pleural effusions. IMPRESSION: CT head: 1. No evidence of acute intracranial abnormality. 2. Mild chronic small vessel ischemic changes within the cerebral white matter. 3. Mild generalized parenchymal atrophy. CT maxillofacial: 1. Comminuted and displaced bilateral  nasal bone fractures. Overlying laceration and soft tissue swelling. 2. Prominent leftward deviation of the bony nasal septum without definite nasal septal fracture. 3. Paranasal sinus disease, most notably severe right maxillary sinusitis. CT cervical spine: 1. No evidence of acute fracture to the cervical spine. 2. Cervical spondylosis, as described. 3. Facet joint ankylosis bilaterally at C2-C3 and C4-C5. 4. Suspected early osseous fusion across the disc space at C6-C7. 5. Bilateral interstitial pulmonary edema. Superimposed partially imaged patchy opacity within the right lung apex, which may reflect edema, atelectasis or pneumonia. Dedicated chest radiographs are recommended for further evaluation. 6. Partially imaged bilateral pleural effusions. Electronically Signed   By: Jackey Loge D.O.   On: 01/28/2022 10:32   CT Cervical Spine Wo Contrast  Result Date: 01/28/2022 CLINICAL DATA:  Provided history: Head trauma, minor. Head trauma, moderate/severe. Neck trauma. Additional history provided: Fall. EXAM: CT HEAD WITHOUT CONTRAST CT MAXILLOFACIAL WITHOUT CONTRAST CT CERVICAL SPINE WITHOUT CONTRAST TECHNIQUE: Multidetector CT imaging of the head, cervical spine, and maxillofacial structures were  performed using the standard protocol without intravenous contrast. Multiplanar CT image reconstructions of the cervical spine and maxillofacial structures were also generated. RADIATION DOSE REDUCTION: This exam was performed according to the departmental dose-optimization program which includes automated exposure control, adjustment of the mA and/or kV according to patient size and/or use of iterative reconstruction technique. FINDINGS: CT HEAD FINDINGS Brain: Mild generalized parenchymal atrophy. Mild patchy and ill-defined hypoattenuation within the cerebral white matter, nonspecific but compatible with chronic small vessel ischemic disease. There is no acute intracranial hemorrhage. No demarcated cortical infarct. No extra-axial fluid collection. No evidence of an intracranial mass. No midline shift. Vascular: No hyperdense vessel.  Atherosclerotic calcifications. Skull: No fracture or aggressive osseous lesion. No mass or acute finding within the imaged orbits. CT MAXILLOFACIAL FINDINGS Osseous: Comminuted and displaced bilateral nasal bone fractures. There is prominent leftward deviation of the bony nasal septum without definite nasal septal fracture identified. Orbits: No acute orbital finding. Sinuses: Extensive opacification of the right maxillary sinus with associated chronic reactive osteitis. Additionally, a pneumatized septation is present within the right maxillary sinus. Small mucous retention cyst within the left maxillary sinus. Minimal mucosal thickening within the bilateral ethmoid air cells. Soft tissues: Nasal soft tissue swelling and laceration. CT CERVICAL SPINE FINDINGS Alignment: Straightening of the expected cervical lordosis. 2 mm C4-C5 grade 1 anterolisthesis. Skull base and vertebrae: The basion-dental and atlanto-dental intervals are maintained.No evidence of acute fracture to the cervical spine. Facet joint ankylosis bilaterally at C2-C3 and C4-C5. Suspected early osseous fusion  across the disc space at C6-C7. Soft tissues and spinal canal: No prevertebral fluid or swelling. No visible canal hematoma. Disc levels: Cervical spondylosis with multilevel disc space narrowing, disc bulges, posterior disc osteophytes, uncovertebral hypertrophy and facet arthrosis. Disc space narrowing is greatest at C5-C6, C6-C7 and C7-T1 (advanced at these levels). Suspected early osseous fusion across the C6-C7 disc space. Multilevel spinal canal stenosis. Most notably at C5-C6, a posterior disc osteophyte complex contributes to apparent moderate spinal canal stenosis. Multilevel bony neural foraminal narrowing. Upper chest: Smooth interlobular septal thickening within the imaged lung apices compatible with edema. Superimposed partially imaged patchy opacity to within the right lung apex. Partially imaged bilateral pleural effusions. IMPRESSION: CT head: 1. No evidence of acute intracranial abnormality. 2. Mild chronic small vessel ischemic changes within the cerebral white matter. 3. Mild generalized parenchymal atrophy. CT maxillofacial: 1. Comminuted and displaced bilateral nasal bone fractures. Overlying laceration and soft tissue swelling. 2. Prominent  leftward deviation of the bony nasal septum without definite nasal septal fracture. 3. Paranasal sinus disease, most notably severe right maxillary sinusitis. CT cervical spine: 1. No evidence of acute fracture to the cervical spine. 2. Cervical spondylosis, as described. 3. Facet joint ankylosis bilaterally at C2-C3 and C4-C5. 4. Suspected early osseous fusion across the disc space at C6-C7. 5. Bilateral interstitial pulmonary edema. Superimposed partially imaged patchy opacity within the right lung apex, which may reflect edema, atelectasis or pneumonia. Dedicated chest radiographs are recommended for further evaluation. 6. Partially imaged bilateral pleural effusions. Electronically Signed   By: Jackey Loge D.O.   On: 01/28/2022 10:32   CT  Maxillofacial Wo Contrast  Result Date: 01/28/2022 CLINICAL DATA:  Provided history: Head trauma, minor. Head trauma, moderate/severe. Neck trauma. Additional history provided: Fall. EXAM: CT HEAD WITHOUT CONTRAST CT MAXILLOFACIAL WITHOUT CONTRAST CT CERVICAL SPINE WITHOUT CONTRAST TECHNIQUE: Multidetector CT imaging of the head, cervical spine, and maxillofacial structures were performed using the standard protocol without intravenous contrast. Multiplanar CT image reconstructions of the cervical spine and maxillofacial structures were also generated. RADIATION DOSE REDUCTION: This exam was performed according to the departmental dose-optimization program which includes automated exposure control, adjustment of the mA and/or kV according to patient size and/or use of iterative reconstruction technique. FINDINGS: CT HEAD FINDINGS Brain: Mild generalized parenchymal atrophy. Mild patchy and ill-defined hypoattenuation within the cerebral white matter, nonspecific but compatible with chronic small vessel ischemic disease. There is no acute intracranial hemorrhage. No demarcated cortical infarct. No extra-axial fluid collection. No evidence of an intracranial mass. No midline shift. Vascular: No hyperdense vessel.  Atherosclerotic calcifications. Skull: No fracture or aggressive osseous lesion. No mass or acute finding within the imaged orbits. CT MAXILLOFACIAL FINDINGS Osseous: Comminuted and displaced bilateral nasal bone fractures. There is prominent leftward deviation of the bony nasal septum without definite nasal septal fracture identified. Orbits: No acute orbital finding. Sinuses: Extensive opacification of the right maxillary sinus with associated chronic reactive osteitis. Additionally, a pneumatized septation is present within the right maxillary sinus. Small mucous retention cyst within the left maxillary sinus. Minimal mucosal thickening within the bilateral ethmoid air cells. Soft tissues: Nasal soft  tissue swelling and laceration. CT CERVICAL SPINE FINDINGS Alignment: Straightening of the expected cervical lordosis. 2 mm C4-C5 grade 1 anterolisthesis. Skull base and vertebrae: The basion-dental and atlanto-dental intervals are maintained.No evidence of acute fracture to the cervical spine. Facet joint ankylosis bilaterally at C2-C3 and C4-C5. Suspected early osseous fusion across the disc space at C6-C7. Soft tissues and spinal canal: No prevertebral fluid or swelling. No visible canal hematoma. Disc levels: Cervical spondylosis with multilevel disc space narrowing, disc bulges, posterior disc osteophytes, uncovertebral hypertrophy and facet arthrosis. Disc space narrowing is greatest at C5-C6, C6-C7 and C7-T1 (advanced at these levels). Suspected early osseous fusion across the C6-C7 disc space. Multilevel spinal canal stenosis. Most notably at C5-C6, a posterior disc osteophyte complex contributes to apparent moderate spinal canal stenosis. Multilevel bony neural foraminal narrowing. Upper chest: Smooth interlobular septal thickening within the imaged lung apices compatible with edema. Superimposed partially imaged patchy opacity to within the right lung apex. Partially imaged bilateral pleural effusions. IMPRESSION: CT head: 1. No evidence of acute intracranial abnormality. 2. Mild chronic small vessel ischemic changes within the cerebral white matter. 3. Mild generalized parenchymal atrophy. CT maxillofacial: 1. Comminuted and displaced bilateral nasal bone fractures. Overlying laceration and soft tissue swelling. 2. Prominent leftward deviation of the bony nasal septum without definite nasal septal fracture.  3. Paranasal sinus disease, most notably severe right maxillary sinusitis. CT cervical spine: 1. No evidence of acute fracture to the cervical spine. 2. Cervical spondylosis, as described. 3. Facet joint ankylosis bilaterally at C2-C3 and C4-C5. 4. Suspected early osseous fusion across the disc space  at C6-C7. 5. Bilateral interstitial pulmonary edema. Superimposed partially imaged patchy opacity within the right lung apex, which may reflect edema, atelectasis or pneumonia. Dedicated chest radiographs are recommended for further evaluation. 6. Partially imaged bilateral pleural effusions. Electronically Signed   By: Jackey Loge D.O.   On: 01/28/2022 10:32    Procedures Procedures    Medications Ordered in ED Medications  Tdap (BOOSTRIX) injection 0.5 mL (has no administration in time range)    ED Course/ Medical Decision Making/ A&P                           Medical Decision Making Amount and/or Complexity of Data Reviewed Labs: ordered. Radiology: ordered.  Risk Prescription drug management.  This patient presents to the ED for concern of fall, this involves a number of treatment options, and is a complaint that carries with it a high risk of complications and morbidity.  The differential diagnosis includes syncope versus mechanical fall.   Co morbidities: Discussed in HPI   Brief History:  Patient with extensive cardiac history including a CABG in the month of March 2023 here status post fall.  Patient reports he ambulated with his cane outside, when suddenly he began to feel dizzy and fell forward.  He reports he did not pass out, there was no loss of consciousness.  However he did strike his head.  He is on warfarin currently.  EMR reviewed including pt PMHx, past surgical history and past visits to ER.   See HPI for more details   Lab Tests:  I ordered and independently interpreted labs.  The pertinent results include:    Labs notable for CMP with slight decrease in potassium but no other electrolyte derangement.  Creatinine level is 1.5 at his baseline.  LFTs are unremarkable.  BNP is at thousand, no signs of volume overload on exam.  CBC with no leukocytosis.  Hemoglobin is slightly decreased at 8.6 however consistent with his prior.  PT and INR at baseline. CBG  99.    Imaging Studies:  CT Head/Cervical spine:  1. No evidence of acute fracture to the cervical spine.  2. Cervical spondylosis, as described.  3. Facet joint ankylosis bilaterally at C2-C3 and C4-C5.  4. Suspected early osseous fusion across the disc space at C6-C7.  5. Bilateral interstitial pulmonary edema. Superimposed partially  imaged patchy opacity within the right lung apex, which may reflect  edema, atelectasis or pneumonia. Dedicated chest radiographs are  recommended for further evaluation.  6. Partially imaged bilateral pleural effusions.   Chest xray within normal limits.   Cardiac Monitoring:  The patient was maintained on a cardiac monitor.  I personally viewed and interpreted the cardiac monitored which showed an underlying rhythm of: NSR EKG non-ischemic  Reevaluation:  After the interventions noted above I re-evaluated patient and found that they have :stayed the same   Social Determinants of Health:  The patient's social determinants of health were a factor in the care of this patient    Problem List / ED Course:  Here status post fall this morning while ambulating with his cane.  Prior history of CABG couple of months ago on medications.  No  chest pain, no shortness of breath, not lightheaded.  No loss of consciousness.  Currently on warfarin.  Labs are at his baseline, chest x-ray without any signs of fluid overload.  No signs of fluid overload on his exam.  Discussed findings with him and wife at the bedside along with daughter.  We did discuss appropriate follow-up with his cardiologist at his earliest convenience.  Patient hemodynamically stable for discharge.   Dispostion:  After consideration of the diagnostic results and the patients response to treatment, I feel that the patent would benefit from outpatient follow-up with cardiology and PCP.   Portions of this note were generated with Scientist, clinical (histocompatibility and immunogenetics). Dictation errors may occur  despite best attempts at proofreading.   Final Clinical Impression(s) / ED Diagnoses Final diagnoses:  Fall, initial encounter  Dizziness    Rx / DC Orders ED Discharge Orders     None         Claude Manges, PA-C 01/28/22 1231    [place], [place] K, DO 01/28/22 1559

## 2022-01-28 NOTE — ED Triage Notes (Signed)
76 year old male transported by EMS after falling at home. He hit head and had a dizzy spell currently on warfarin and diuretics, he has abrasions to R forearm, back of head and nose. Patient had nose bleed on the way to facility but was controlled by EMS. He had low BP on way to facility but was given IV fluids to increase reading, he has a 20G to L AC. HX of CHF, and recent by-pass.

## 2022-01-29 ENCOUNTER — Telehealth (HOSPITAL_COMMUNITY): Payer: Self-pay | Admitting: Cardiology

## 2022-01-29 MED ORDER — DIGOXIN 125 MCG PO TABS: 0.0625 mg | ORAL_TABLET | Freq: Every day | ORAL | 3 refills | Status: DC

## 2022-01-29 NOTE — Telephone Encounter (Signed)
-----   Message from Allayne Butcher, [place] sent at 01/27/2022  9:40 AM EDT ----- Dig level improved. Make sure he is taking only 0.0625 mg daily. Will continue to follow in clinic.

## 2022-01-29 NOTE — Telephone Encounter (Signed)
Patient called.  Patient aware reports he is only taking 1/2 dig daily  Med list updated

## 2022-01-30 ENCOUNTER — Telehealth: Payer: Self-pay

## 2022-01-30 NOTE — Telephone Encounter (Signed)
  Transition Care Management Follow-up Telephone Call    Keith Jenkins 02-07-1946  Visit Date: 01/28/2022 Discharge Date: 01/28/2022 Discharged from where: [facility]  Diagnoses: Fall, Dizziness  2 day post: 01/30/2022  He/She was contacted today via telephone in regards to transition of care. Left message for patient to call out office   Discharge Instructions: Follow up with Cardiologist and PCP  01/30/22 10:24 AM

## 2022-01-31 ENCOUNTER — Encounter: Payer: Self-pay | Admitting: *Deleted

## 2022-02-03 ENCOUNTER — Other Ambulatory Visit: Payer: Self-pay | Admitting: Nurse Practitioner

## 2022-02-03 DIAGNOSIS — F320 Major depressive disorder, single episode, mild: Secondary | ICD-10-CM

## 2022-02-04 ENCOUNTER — Ambulatory Visit: Payer: Medicare HMO | Admitting: Internal Medicine

## 2022-02-04 ENCOUNTER — Telehealth: Payer: Self-pay

## 2022-02-04 ENCOUNTER — Ambulatory Visit: Payer: Medicare HMO | Attending: Cardiology | Admitting: *Deleted

## 2022-02-04 ENCOUNTER — Encounter: Payer: Self-pay | Admitting: Internal Medicine

## 2022-02-04 VITALS — BP 100/60 | HR 67 | Ht 74.0 in | Wt 194.1 lb

## 2022-02-04 DIAGNOSIS — R1319 Other dysphagia: Secondary | ICD-10-CM | POA: Diagnosis not present

## 2022-02-04 DIAGNOSIS — Z952 Presence of prosthetic heart valve: Secondary | ICD-10-CM

## 2022-02-04 DIAGNOSIS — Z1211 Encounter for screening for malignant neoplasm of colon: Secondary | ICD-10-CM | POA: Diagnosis not present

## 2022-02-04 DIAGNOSIS — K219 Gastro-esophageal reflux disease without esophagitis: Secondary | ICD-10-CM | POA: Diagnosis not present

## 2022-02-04 DIAGNOSIS — R933 Abnormal findings on diagnostic imaging of other parts of digestive tract: Secondary | ICD-10-CM

## 2022-02-04 DIAGNOSIS — Z7901 Long term (current) use of anticoagulants: Secondary | ICD-10-CM

## 2022-02-04 LAB — POCT INR: INR: 4.1 — AB (ref 2.0–3.0)

## 2022-02-04 NOTE — Patient Instructions (Signed)
If you are age 74 or older, your body mass index should be between 23-30. Your Body mass index is 24.92 kg/m. If this is out of the aforementioned range listed, please consider follow up with your Primary Care Provider.  If you are age 63 or younger, your body mass index should be between 19-25. Your Body mass index is 24.92 kg/m. If this is out of the aformentioned range listed, please consider follow up with your Primary Care Provider.   We will contact Cardiology regarding holding Warfarin and then contact you to schedule Colonoscopy.  The [facility] providers would like to encourage you to use [facility] to communicate with providers for non-urgent requests or questions.  Due to long hold times on the telephone, sending your provider a message by [facility] may be a faster and more efficient way to get a response.  Please allow 48 business hours for a response.  Please remember that this is for non-urgent requests.   It was a pleasure to see you today!  Thank you for trusting me with your gastrointestinal care!

## 2022-02-04 NOTE — Telephone Encounter (Signed)
[facility] Pre-operative Risk Assessment     Request for surgical clearance:     Endoscopy Procedure  What type of surgery is being performed?     Endoscopy   When is this surgery scheduled?     TBD  What type of clearance is required ?   Pharmacy  Are there any medications that need to be held prior to surgery and how long? Warfarin 5 days   Practice name and name of physician performing surgery?      [facility]  What is your office phone and fax number?      Phone- [phone]  [phone]  Anesthesia type (None, local, MAC, general) ?       MAC
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
## 2022-02-04 NOTE — Patient Instructions (Signed)
Description   Do not take any Warfarin today then start taking taking 2 tablets (2mg ) daily except 3 tablets (3mg ) on Mondays and Fridays. Recheck INR in 1 week. Coumadin Clinic [phone]

## 2022-02-07 ENCOUNTER — Encounter: Payer: Medicare HMO | Admitting: Gastroenterology

## 2022-02-11 ENCOUNTER — Ambulatory Visit: Payer: Medicare HMO | Attending: Cardiology

## 2022-02-11 DIAGNOSIS — Z7901 Long term (current) use of anticoagulants: Secondary | ICD-10-CM

## 2022-02-11 DIAGNOSIS — Z952 Presence of prosthetic heart valve: Secondary | ICD-10-CM | POA: Diagnosis not present

## 2022-02-11 LAB — POCT INR: INR: 3.2 — AB (ref 2.0–3.0)

## 2022-02-11 NOTE — Patient Instructions (Signed)
Description   Only take 1 tablet today and then continue taking taking 2 tablets (2mg ) daily except 3 tablets (3mg ) on Mondays and Fridays.  Recheck INR in 1 week. Coumadin Clinic [phone]

## 2022-02-11 NOTE — Telephone Encounter (Signed)
Request for patient to undergo EGD with dilatation in hospital with 5 day warfarin hold. GI is aware of patient's cardiac history and recent CABG/MAZE, and MVR.   Please direct your response to p cv div preop.  Thank you, Levi Aland, NP-C    02/11/2022, 12:03 PM [address] Office [phone] Fax [phone]

## 2022-02-12 ENCOUNTER — Other Ambulatory Visit: Payer: Self-pay

## 2022-02-12 ENCOUNTER — Emergency Department (HOSPITAL_COMMUNITY)
Admission: EM | Admit: 2022-02-12 | Discharge: 2022-02-12 | Disposition: A | Discharge status: Home / Self Care | Payer: Medicare HMO | Attending: Emergency Medicine | Admitting: Emergency Medicine

## 2022-02-12 ENCOUNTER — Encounter (HOSPITAL_COMMUNITY): Payer: Self-pay

## 2022-02-12 ENCOUNTER — Emergency Department (HOSPITAL_COMMUNITY): Payer: Medicare HMO

## 2022-02-12 DIAGNOSIS — Z043 Encounter for examination and observation following other accident: Secondary | ICD-10-CM | POA: Diagnosis not present

## 2022-02-12 DIAGNOSIS — R58 Hemorrhage, not elsewhere classified: Secondary | ICD-10-CM | POA: Diagnosis not present

## 2022-02-12 DIAGNOSIS — I959 Hypotension, unspecified: Secondary | ICD-10-CM | POA: Diagnosis not present

## 2022-02-12 DIAGNOSIS — Z7982 Long term (current) use of aspirin: Secondary | ICD-10-CM | POA: Diagnosis not present

## 2022-02-12 DIAGNOSIS — Z743 Need for continuous supervision: Secondary | ICD-10-CM | POA: Diagnosis not present

## 2022-02-12 DIAGNOSIS — W01198A Fall on same level from slipping, tripping and stumbling with subsequent striking against other object, initial encounter: Secondary | ICD-10-CM | POA: Insufficient documentation

## 2022-02-12 DIAGNOSIS — Y9301 Activity, walking, marching and hiking: Secondary | ICD-10-CM | POA: Diagnosis not present

## 2022-02-12 DIAGNOSIS — R9431 Abnormal electrocardiogram [ECG] [EKG]: Secondary | ICD-10-CM | POA: Diagnosis not present

## 2022-02-12 DIAGNOSIS — R42 Dizziness and giddiness: Secondary | ICD-10-CM | POA: Diagnosis not present

## 2022-02-12 DIAGNOSIS — Z79899 Other long term (current) drug therapy: Secondary | ICD-10-CM | POA: Insufficient documentation

## 2022-02-12 DIAGNOSIS — S0101XA Laceration without foreign body of scalp, initial encounter: Secondary | ICD-10-CM | POA: Diagnosis not present

## 2022-02-12 DIAGNOSIS — W19XXXA Unspecified fall, initial encounter: Secondary | ICD-10-CM | POA: Diagnosis not present

## 2022-02-12 DIAGNOSIS — Z20822 Contact with and (suspected) exposure to covid-19: Secondary | ICD-10-CM | POA: Insufficient documentation

## 2022-02-12 DIAGNOSIS — S0990XA Unspecified injury of head, initial encounter: Secondary | ICD-10-CM | POA: Diagnosis not present

## 2022-02-12 DIAGNOSIS — S022XXA Fracture of nasal bones, initial encounter for closed fracture: Secondary | ICD-10-CM | POA: Diagnosis not present

## 2022-02-12 DIAGNOSIS — J9811 Atelectasis: Secondary | ICD-10-CM | POA: Diagnosis not present

## 2022-02-12 DIAGNOSIS — Z7901 Long term (current) use of anticoagulants: Secondary | ICD-10-CM | POA: Diagnosis not present

## 2022-02-12 DIAGNOSIS — J90 Pleural effusion, not elsewhere classified: Secondary | ICD-10-CM | POA: Diagnosis not present

## 2022-02-12 LAB — COMPREHENSIVE METABOLIC PANEL
ALT: 24 U/L (ref 0–44)
AST: 35 U/L (ref 15–41)
Albumin: 2.7 g/dL — ABNORMAL LOW (ref 3.5–5.0)
Alkaline Phosphatase: 91 U/L (ref 38–126)
Anion gap: 9 (ref 5–15)
BUN: 22 mg/dL (ref 8–23)
CO2: 26 mmol/L (ref 22–32)
Calcium: 8.3 mg/dL — ABNORMAL LOW (ref 8.9–10.3)
Chloride: 101 mmol/L (ref 98–111)
Creatinine, Ser: 1.63 mg/dL — ABNORMAL HIGH (ref 0.61–1.24)
GFR, Estimated: 44 mL/min — ABNORMAL LOW (ref >60)
Glucose, Bld: 139 mg/dL — ABNORMAL HIGH (ref 70–99)
Potassium: 3.9 mmol/L (ref 3.5–5.1)
Sodium: 136 mmol/L (ref 135–145)
Total Bilirubin: 1 mg/dL (ref 0.3–1.2)
Total Protein: 5.9 g/dL — ABNORMAL LOW (ref 6.5–8.1)

## 2022-02-12 LAB — RESP PANEL BY RT-PCR (FLU A&B, COVID) ARPGX2
Influenza A by PCR: NEGATIVE
Influenza B by PCR: NEGATIVE
SARS Coronavirus 2 by RT PCR: NEGATIVE

## 2022-02-12 LAB — URINALYSIS, ROUTINE W REFLEX MICROSCOPIC
Bacteria, UA: NONE SEEN
Bilirubin Urine: NEGATIVE
Glucose, UA: >=500 mg/dL — AB
Hgb urine dipstick: NEGATIVE
Ketones, ur: NEGATIVE mg/dL
Leukocytes,Ua: NEGATIVE
Nitrite: NEGATIVE
Protein, ur: NEGATIVE mg/dL
Specific Gravity, Urine: 1.005 (ref 1.005–1.030)
pH: 7 (ref 5.0–8.0)

## 2022-02-12 LAB — CBC
HCT: 27.4 % — ABNORMAL LOW (ref 39.0–52.0)
Hemoglobin: 8.6 g/dL — ABNORMAL LOW (ref 13.0–17.0)
MCH: 22.6 pg — ABNORMAL LOW (ref 26.0–34.0)
MCHC: 31.4 g/dL (ref 30.0–36.0)
MCV: 72.1 fL — ABNORMAL LOW (ref 80.0–100.0)
Platelets: 285 10*3/uL (ref 150–400)
RBC: 3.8 MIL/uL — ABNORMAL LOW (ref 4.22–5.81)
RDW: 19.8 % — ABNORMAL HIGH (ref 11.5–15.5)
WBC: 5.2 10*3/uL (ref 4.0–10.5)
nRBC: 0 % (ref 0.0–0.2)

## 2022-02-12 LAB — PROTIME-INR
INR: 3.1 — ABNORMAL HIGH (ref 0.8–1.2)
Prothrombin Time: 32.1 seconds — ABNORMAL HIGH (ref 11.4–15.2)

## 2022-02-12 LAB — I-STAT CHEM 8, ED
BUN: 28 mg/dL — ABNORMAL HIGH (ref 8–23)
Calcium, Ion: 1.05 mmol/L — ABNORMAL LOW (ref 1.15–1.40)
Chloride: 99 mmol/L (ref 98–111)
Creatinine, Ser: 1.7 mg/dL — ABNORMAL HIGH (ref 0.61–1.24)
Glucose, Bld: 136 mg/dL — ABNORMAL HIGH (ref 70–99)
HCT: 27 % — ABNORMAL LOW (ref 39.0–52.0)
Hemoglobin: 9.2 g/dL — ABNORMAL LOW (ref 13.0–17.0)
Potassium: 4 mmol/L (ref 3.5–5.1)
Sodium: 137 mmol/L (ref 135–145)
TCO2: 29 mmol/L (ref 22–32)

## 2022-02-12 LAB — CK: Total CK: 76 U/L (ref 49–397)

## 2022-02-12 LAB — LACTIC ACID, PLASMA: Lactic Acid, Venous: 1.6 mmol/L (ref 0.5–1.9)

## 2022-02-12 LAB — SAMPLE TO BLOOD BANK

## 2022-02-12 LAB — ETHANOL: Alcohol, Ethyl (B): <10 mg/dL (ref <10)

## 2022-02-12 MED ORDER — MORPHINE SULFATE (PF) 4 MG/ML IV SOLN
4.0000 mg | Freq: Once | INTRAVENOUS | Status: AC
  Administered 2022-02-12: 4 mg via INTRAVENOUS
  Filled 2022-02-12: qty 1

## 2022-02-12 MED ORDER — SODIUM CHLORIDE 0.9 % IV BOLUS
1000.0000 mL | Freq: Once | INTRAVENOUS | Status: AC
  Administered 2022-02-12: 1000 mL via INTRAVENOUS
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
## 2022-02-12 NOTE — ED Notes (Signed)
All discharge instructions reviewed with patient including follow up care and prescriptions. Patient verbalized understanding of same and had no other questions. Patient stable at time of discharge and wheel chair was provided to patient prior to leaving department.  

## 2022-02-12 NOTE — ED Notes (Signed)
Patient transported to CT 

## 2022-02-12 NOTE — ED Notes (Signed)
ED provider at bedside to evaluate patient at this time. Patient GCS 15 and answering all questions appropriately.

## 2022-02-12 NOTE — ED Triage Notes (Signed)
Patient arrives via EMS from home due to ground level fall. Per patient he was walking his dog outside, became dizzy and fell and hit his head on the concrete. Patient takes warfarin. Small laceration to right side of head, bleeding controlled prior to arrival. GCS 15.

## 2022-02-12 NOTE — ED Provider Notes (Signed)
[facility] EMERGENCY DEPARTMENT Provider Note   CSN: [id number] Arrival date & time: 02/12/22  1657     History  Chief Complaint  Patient presents with   Keith Jenkins is a 76 y.o. male history of aortic valve replacement on Coumadin, here presenting with fall.  Patient states that he went out in the heat to walk his dog.  He states that he felt lightheaded and dizzy and slipped and fell and hit the right side of his head.  He noticed a laceration on the right scalp area.  Denies passing out.  Denies any chest pain.  Patient came in as a level 2 trauma since he is on Coumadin.  Patient has frequent falls and came in about 2 weeks ago after a fall.  He is tetanus was updated at that time.  The history is provided by the patient.       Home Medications Prior to Admission medications   Medication Sig Start Date End Date Taking? Authorizing Provider  albuterol (VENTOLIN HFA) 108 (90 Base) MCG/ACT inhaler Inhale 2 puffs into the lungs every 6 (six) hours as needed for wheezing or shortness of breath. Patient not taking: Reported on 02/04/2022 10/09/21   [name], NP  amiodarone (PACERONE) 200 MG tablet Take 0.5 tablets (100 mg total) by mouth daily. 12/04/21   Jacklynn Ganong, FNP  aspirin EC 81 MG tablet Take 1 tablet (81 mg total) by mouth daily. Swallow whole. 12/04/21   Milford, Anderson Malta, FNP  atorvastatin (LIPITOR) 80 MG tablet Take 1 tablet (80 mg total) by mouth daily. 01/06/22   Bensimhon, Bevelyn Buckles, MD  dapagliflozin propanediol (FARXIGA) 10 MG TABS tablet Take 1 tablet (10 mg total) by mouth daily. 12/27/21   Bensimhon, Bevelyn Buckles, MD  digoxin (LANOXIN) 0.125 MG tablet Take 0.5 tablets (0.0625 mg total) by mouth daily. 01/29/22   Robbie Lis M, PA-C  escitalopram (LEXAPRO) 10 MG tablet TAKE ONE TABLET BY MOUTH ONCE DAILY 02/03/22   Cox, Fritzi Mandes, MD  esomeprazole (NEXIUM) 40 MG capsule Take 40 mg by mouth daily.    [provider]   ezetimibe (ZETIA) 10 MG tablet Take 0.5 tablets (5 mg total) by mouth daily. 01/14/22   Robbie Lis M, PA-C  Fluticasone-Umeclidin-Vilant (TRELEGY ELLIPTA) 100-62.5-25 MCG/ACT AEPB Inhale 1 puff into the lungs daily. 12/05/21   [name], NP  gabapentin (NEURONTIN) 300 MG capsule Take 1 capsule (300 mg total) by mouth at bedtime. 01/22/22   Cox, Kirsten, MD  latanoprost (XALATAN) 0.005 % ophthalmic solution Place 1 drop into both eyes at bedtime. 06/10/21   [provider]  midodrine (PROAMATINE) 5 MG tablet Take 1 tablet (5 mg total) by mouth 3 (three) times daily with meals. 01/14/22   Robbie Lis M, PA-C  nitroGLYCERIN (NITROSTAT) 0.4 MG SL tablet Place 1 tablet (0.4 mg total) under the tongue every 5 (five) minutes as needed for chest pain. 12/04/21 03/04/22  Jacklynn Ganong, FNP  OVER THE COUNTER MEDICATION Apply 1 application. topically daily as needed (For back pain). Medication: Magnilife Cream    [provider]  potassium chloride SA (KLOR-CON M) 20 MEQ tablet Take 2 tablets (40 mEq total) by mouth daily. 01/14/22   Allayne Butcher, PA-C  spironolactone (ALDACTONE) 25 MG tablet Take 0.5 tablets (12.5 mg total) by mouth daily. 11/18/21   Barrett, Erin R, PA-C  torsemide (DEMADEX) 20 MG tablet Take 2 tablets (40 mg total) by mouth  daily. 12/17/21   Jacklynn Ganong, FNP  warfarin (COUMADIN) 1 MG tablet Take 3 tablets daily. (3mg  daily) Patient taking differently: Take 1 mg by mouth. Take 3 Monday Wednesday Friday (1 mg tablets. ) ,2 Saturday Sunday Tuesday Thursday. 01/28/22   Bensimhon, Bevelyn Buckles, MD      Allergies    Patient has no known allergies.    Review of Systems   Review of Systems  Skin:  Positive for wound.  All other systems reviewed and are negative.   Physical Exam Updated Vital Signs BP 114/71   Pulse 65   Temp 97.9 F (36.6 C)   Resp 15   Ht 6\' 2"  (1.88 m)   Wt 88 kg   SpO2 97%   BMI 24.91 kg/m  Physical Exam Vitals and  nursing note reviewed.  HENT:     Head: Normocephalic.     Comments: 5 cm right scalp laceration.    Nose: Nose normal.     Mouth/Throat:     Mouth: Mucous membranes are moist.  Eyes:     Extraocular Movements: Extraocular movements intact.     Pupils: Pupils are equal, round, and reactive to light.  Cardiovascular:     Rate and Rhythm: Normal rate and regular rhythm.     Pulses: Normal pulses.     Heart sounds: Normal heart sounds.  Pulmonary:     Effort: Pulmonary effort is normal.     Breath sounds: Normal breath sounds.  Abdominal:     General: Abdomen is flat.     Palpations: Abdomen is soft.  Musculoskeletal:     Cervical back: Normal range of motion and neck supple.     Comments: Bruising of the right scapular area.  No midline spinal tenderness.  No abdominal bruising.  Normal range of motion bilateral hips.  No extremity trauma.  Skin:    General: Skin is warm.     Capillary Refill: Capillary refill takes less than 2 seconds.  Neurological:     General: No focal deficit present.     Mental Status: He is alert and oriented to person, place, and time.  Psychiatric:        Mood and Affect: Mood normal.        Behavior: Behavior normal.     ED Results / Procedures / Treatments   Labs (all labs ordered are listed, but only abnormal results are displayed) Labs Reviewed  COMPREHENSIVE METABOLIC PANEL - Abnormal; Notable for the following components:      Result Value   Glucose, Bld 139 (*)    Creatinine, Ser 1.63 (*)    Calcium 8.3 (*)    Total Protein 5.9 (*)    Albumin 2.7 (*)    GFR, Estimated 44 (*)    All other components within normal limits  CBC - Abnormal; Notable for the following components:   RBC 3.80 (*)    Hemoglobin 8.6 (*)    HCT 27.4 (*)    MCV 72.1 (*)    MCH 22.6 (*)    RDW 19.8 (*)    All other components within normal limits  PROTIME-INR - Abnormal; Notable for the following components:   Prothrombin Time 32.1 (*)    INR 3.1 (*)    All  other components within normal limits  I-STAT CHEM 8, ED - Abnormal; Notable for the following components:   BUN 28 (*)    Creatinine, Ser 1.70 (*)    Glucose, Bld 136 (*)  Calcium, Ion 1.05 (*)    Hemoglobin 9.2 (*)    HCT 27.0 (*)    All other components within normal limits  RESP PANEL BY RT-PCR (FLU A&B, COVID) ARPGX2  ETHANOL  LACTIC ACID, PLASMA  CK  URINALYSIS, ROUTINE W REFLEX MICROSCOPIC  SAMPLE TO BLOOD BANK    EKG EKG Interpretation  Date/Time:  Wednesday February 12 2022 17:05:52 EDT Ventricular Rate:  64 PR Interval:  168 QRS Duration: 107 QT Interval:  454 QTC Calculation: 469 R Axis:   75 Text Interpretation: Sinus rhythm Low voltage, extremity leads Anteroseptal infarct, old Borderline ST depression, anterolateral leads No significant change since last tracing Confirmed by Richardean Canal [phone]) on 02/12/2022 5:29:25 PM  Radiology CT HEAD WO CONTRAST ( )  Result Date: 02/12/2022 CLINICAL DATA:  Fall EXAM: CT HEAD WITHOUT CONTRAST CT CERVICAL SPINE WITHOUT CONTRAST TECHNIQUE: Multidetector CT imaging of the head and cervical spine was performed following the standard protocol without intravenous contrast. Multiplanar CT image reconstructions of the cervical spine were also generated. RADIATION DOSE REDUCTION: This exam was performed according to the departmental dose-optimization program which includes automated exposure control, adjustment of the mA and/or kV according to patient size and/or use of iterative reconstruction technique. COMPARISON:  None Available. CT head and cervical spine 01/28/2022 FINDINGS: CT HEAD FINDINGS Brain: There is no acute intracranial hemorrhage, extra-axial fluid collection, or acute infarct Parenchymal volume is normal. The ventricles are normal in size. Gray-white differentiation is preserved. There is no mass lesion. There is no mass effect or midline shift. Vascular: There is calcification of the bilateral carotid siphons. Skull:  Normal. Negative for fracture or focal lesion. Sinuses/Orbits: Chronic right maxillary sinusitis is again seen. Bilateral lens implants are in place. The globes and orbits are otherwise unremarkable. Other: Bilateral nasal bone fractures are again seen. CT CERVICAL SPINE FINDINGS Alignment: Normal. There is no jumped or perched facet or other evidence of traumatic malalignment. Skull base and vertebrae: Skull base alignment is maintained. Vertebral body heights are preserved. There is no evidence of acute injury. There is no suspicious osseous lesion. Ankylosis across the C2-C3 and C4-C5 facet joints is unchanged. Soft tissues and spinal canal: No prevertebral fluid or swelling. No visible canal hematoma. Disc levels: Disc space narrowing degenerative endplate change is most advanced at C5-C6. Facet arthropathy is most advanced at C4-C5. There is no evidence of high-grade spinal canal stenosis. Upper chest: There are bilateral pleural effusions with interlobular septal thickening consistent with pulmonary interstitial edema. Other: None. IMPRESSION: 1. No acute intracranial pathology. 2. No acute fracture or traumatic malalignment of the cervical spine. 3. Unchanged alignment of bilateral nasal bone fractures. 4. At least moderate-sized bilateral pleural effusions and pulmonary interstitial edema. Findings may reflect CHF. Electronically Signed   By: Lesia Hausen M.D.   On: 02/12/2022 17:42   CT Cervical Spine Wo Contrast  Result Date: 02/12/2022 CLINICAL DATA:  Fall EXAM: CT HEAD WITHOUT CONTRAST CT CERVICAL SPINE WITHOUT CONTRAST TECHNIQUE: Multidetector CT imaging of the head and cervical spine was performed following the standard protocol without intravenous contrast. Multiplanar CT image reconstructions of the cervical spine were also generated. RADIATION DOSE REDUCTION: This exam was performed according to the departmental dose-optimization program which includes automated exposure control, adjustment of  the mA and/or kV according to patient size and/or use of iterative reconstruction technique. COMPARISON:  None Available. CT head and cervical spine 01/28/2022 FINDINGS: CT HEAD FINDINGS Brain: There is no acute intracranial hemorrhage, extra-axial fluid collection, or acute  infarct Parenchymal volume is normal. The ventricles are normal in size. Gray-white differentiation is preserved. There is no mass lesion. There is no mass effect or midline shift. Vascular: There is calcification of the bilateral carotid siphons. Skull: Normal. Negative for fracture or focal lesion. Sinuses/Orbits: Chronic right maxillary sinusitis is again seen. Bilateral lens implants are in place. The globes and orbits are otherwise unremarkable. Other: Bilateral nasal bone fractures are again seen. CT CERVICAL SPINE FINDINGS Alignment: Normal. There is no jumped or perched facet or other evidence of traumatic malalignment. Skull base and vertebrae: Skull base alignment is maintained. Vertebral body heights are preserved. There is no evidence of acute injury. There is no suspicious osseous lesion. Ankylosis across the C2-C3 and C4-C5 facet joints is unchanged. Soft tissues and spinal canal: No prevertebral fluid or swelling. No visible canal hematoma. Disc levels: Disc space narrowing degenerative endplate change is most advanced at C5-C6. Facet arthropathy is most advanced at C4-C5. There is no evidence of high-grade spinal canal stenosis. Upper chest: There are bilateral pleural effusions with interlobular septal thickening consistent with pulmonary interstitial edema. Other: None. IMPRESSION: 1. No acute intracranial pathology. 2. No acute fracture or traumatic malalignment of the cervical spine. 3. Unchanged alignment of bilateral nasal bone fractures. 4. At least moderate-sized bilateral pleural effusions and pulmonary interstitial edema. Findings may reflect CHF. Electronically Signed   By: Lesia Hausen M.D.   On: 02/12/2022 17:42    DG Chest Port 1 View  Result Date: 02/12/2022 CLINICAL DATA:  Trauma, fall, was outside walking the dog and became dizzy, fell striking head on concrete EXAM: PORTABLE CHEST 1 VIEW COMPARISON:  Portable exam 1701 hours compared to 01/28/2022 FINDINGS: Borderline enlargement of cardiac silhouette post MVR and LEFT atrial appendage clipping. Mediastinal contours and pulmonary vascularity normal. Atherosclerotic calcification aorta. Small LEFT pleural effusion and LEFT basilar atelectasis again seen. Minimal chronic interstitial prominence unchanged. No new infiltrate or pneumothorax. No fractures identified. IMPRESSION: Persistent small LEFT pleural effusion and LEFT basilar atelectasis. Aortic Atherosclerosis (ICD10-I70.0). Electronically Signed   By: Ulyses Southward M.D.   On: 02/12/2022 17:11    Procedures Procedures    LACERATION REPAIR Performed by: Richardean Canal Authorized by: Richardean Canal Consent: Verbal consent obtained. Risks and benefits: risks, benefits and alternatives were discussed Consent given by: patient Patient identity confirmed: provided demographic data Prepped and Draped in normal sterile fashion Wound explored  Laceration Location: R scalp  Laceration Length:  5 cm  No Foreign Bodies seen or palpated  Anesthesia: none  Local anesthetic: none  Irrigation method: syringe Amount of cleaning: standard  Skin closure: staples  Number of staples: 4  Technique: see above   Patient tolerance: Patient tolerated the procedure well with no immediate complications.   Medications Ordered in ED Medications  morphine (PF) 4 MG/ML injection 4 mg (has no administration in time range)  sodium chloride 0.9 % bolus 1,000 mL (1,000 mLs Intravenous New Bag/Given 02/12/22 1712)    ED Course/ Medical Decision Making/ A&P                           Medical Decision Making Brayson Dilorenzo is a 76 y.o. male here presenting with fall and head injury.  Patient is on Coumadin after a  valve replacement.  Level 2 trauma was activated.  Plan to get CT head and cervical spine.  Also consider heat exhaustion as well so we will get CBC and CMP and  CK level.  We will hydrate patient and reassess.  6:20 PM I reviewed patient's labs and independently interpreted imaging studies.  CT head and cervical spine were unremarkable.  Patient's hemoglobin is baseline at 8.6.  His creatinine is also baseline 1.6.  Patient was given IV fluids and felt better.  I was able to staple the laceration.  At this point, patient is stable for discharge.  Staple removal in a week.   Amount and/or Complexity of Data Reviewed Labs: ordered. Decision-making details documented in ED Course. Radiology: ordered and independent interpretation performed. Decision-making details documented in ED Course. ECG/medicine tests: ordered and independent interpretation performed. Decision-making details documented in ED Course.    Final Clinical Impression(s) / ED Diagnoses Final diagnoses:  None    Rx / DC Orders ED Discharge Orders     None         Charlynne Pander, MD 02/12/22 1821

## 2022-02-12 NOTE — Progress Notes (Signed)
Orthopedic Tech Progress Note Patient Details:  Keith Jenkins February 20, 1946 [id number]  LEVEL 2 Trauma, ortho not needed as of now.  Patient ID: Keith Jenkins, male   DOB: 10/27/1945, 76 y.o.   MRN: [id number]  Keith Jenkins 02/12/2022, 6:04 PM

## 2022-02-12 NOTE — Discharge Instructions (Signed)
Stay hydrated.  Staple removal in a week.  See your doctor for follow-up.  Take Tylenol for pain.  Your INR is 3.1 today so please hold Coumadin tonight and restart your Coumadin tomorrow  Return to ER if you have severe headache, vomiting, uncontrolled bleeding

## 2022-02-13 NOTE — Telephone Encounter (Signed)
Resending to Pharmacy pool due to INR not therapeutic. Has return appointment in 1 week.

## 2022-02-14 ENCOUNTER — Telehealth (HOSPITAL_COMMUNITY): Payer: Self-pay | Admitting: Surgery

## 2022-02-14 ENCOUNTER — Ambulatory Visit (HOSPITAL_COMMUNITY)
Admission: RE | Admit: 2022-02-14 | Discharge: 2022-02-14 | Disposition: A | Discharge status: Home / Self Care | Payer: Medicare HMO | Source: Ambulatory Visit | Attending: Family Medicine | Admitting: Family Medicine

## 2022-02-14 ENCOUNTER — Encounter (HOSPITAL_COMMUNITY): Payer: Self-pay

## 2022-02-14 VITALS — BP 108/66 | HR 64 | Ht 73.0 in | Wt 199.4 lb

## 2022-02-14 DIAGNOSIS — Z79899 Other long term (current) drug therapy: Secondary | ICD-10-CM | POA: Diagnosis not present

## 2022-02-14 DIAGNOSIS — Z953 Presence of xenogenic heart valve: Secondary | ICD-10-CM | POA: Diagnosis not present

## 2022-02-14 DIAGNOSIS — I309 Acute pericarditis, unspecified: Secondary | ICD-10-CM | POA: Insufficient documentation

## 2022-02-14 DIAGNOSIS — Z7984 Long term (current) use of oral hypoglycemic drugs: Secondary | ICD-10-CM | POA: Insufficient documentation

## 2022-02-14 DIAGNOSIS — I480 Paroxysmal atrial fibrillation: Secondary | ICD-10-CM | POA: Insufficient documentation

## 2022-02-14 DIAGNOSIS — I3139 Other pericardial effusion (noninflammatory): Secondary | ICD-10-CM | POA: Diagnosis not present

## 2022-02-14 DIAGNOSIS — Z951 Presence of aortocoronary bypass graft: Secondary | ICD-10-CM | POA: Insufficient documentation

## 2022-02-14 DIAGNOSIS — E785 Hyperlipidemia, unspecified: Secondary | ICD-10-CM | POA: Insufficient documentation

## 2022-02-14 DIAGNOSIS — Z952 Presence of prosthetic heart valve: Secondary | ICD-10-CM | POA: Diagnosis not present

## 2022-02-14 DIAGNOSIS — Z7901 Long term (current) use of anticoagulants: Secondary | ICD-10-CM | POA: Diagnosis not present

## 2022-02-14 DIAGNOSIS — K222 Esophageal obstruction: Secondary | ICD-10-CM | POA: Diagnosis not present

## 2022-02-14 DIAGNOSIS — W19XXXS Unspecified fall, sequela: Secondary | ICD-10-CM

## 2022-02-14 DIAGNOSIS — I351 Nonrheumatic aortic (valve) insufficiency: Secondary | ICD-10-CM | POA: Diagnosis not present

## 2022-02-14 DIAGNOSIS — I5022 Chronic systolic (congestive) heart failure: Secondary | ICD-10-CM | POA: Insufficient documentation

## 2022-02-14 DIAGNOSIS — J449 Chronic obstructive pulmonary disease, unspecified: Secondary | ICD-10-CM | POA: Diagnosis not present

## 2022-02-14 DIAGNOSIS — I340 Nonrheumatic mitral (valve) insufficiency: Secondary | ICD-10-CM | POA: Diagnosis not present

## 2022-02-14 DIAGNOSIS — R7303 Prediabetes: Secondary | ICD-10-CM | POA: Diagnosis not present

## 2022-02-14 DIAGNOSIS — R5381 Other malaise: Secondary | ICD-10-CM | POA: Diagnosis not present

## 2022-02-14 DIAGNOSIS — I252 Old myocardial infarction: Secondary | ICD-10-CM | POA: Insufficient documentation

## 2022-02-14 DIAGNOSIS — K219 Gastro-esophageal reflux disease without esophagitis: Secondary | ICD-10-CM | POA: Insufficient documentation

## 2022-02-14 DIAGNOSIS — I2510 Atherosclerotic heart disease of native coronary artery without angina pectoris: Secondary | ICD-10-CM | POA: Insufficient documentation

## 2022-02-14 LAB — BASIC METABOLIC PANEL
Anion gap: 9 (ref 5–15)
BUN: 23 mg/dL (ref 8–23)
CO2: 27 mmol/L (ref 22–32)
Calcium: 8.9 mg/dL (ref 8.9–10.3)
Chloride: 100 mmol/L (ref 98–111)
Creatinine, Ser: 1.59 mg/dL — ABNORMAL HIGH (ref 0.61–1.24)
GFR, Estimated: 45 mL/min — ABNORMAL LOW (ref >60)
Glucose, Bld: 100 mg/dL — ABNORMAL HIGH (ref 70–99)
Potassium: 4.2 mmol/L (ref 3.5–5.1)
Sodium: 136 mmol/L (ref 135–145)

## 2022-02-14 LAB — BRAIN NATRIURETIC PEPTIDE: B Natriuretic Peptide: 1321.3 pg/mL — ABNORMAL HIGH (ref 0.0–100.0)

## 2022-02-14 MED ORDER — TORSEMIDE 20 MG PO TABS: 40.0000 mg | ORAL_TABLET | Freq: Every day | ORAL | 3 refills | Status: DC

## 2022-02-14 MED ORDER — MIDODRINE HCL 10 MG PO TABS: 10.0000 mg | ORAL_TABLET | Freq: Three times a day (TID) | ORAL | 6 refills | Status: DC

## 2022-02-14 NOTE — Progress Notes (Signed)
station D Ruler 32.5 Reading 53%

## 2022-02-14 NOTE — Patient Instructions (Signed)
Thank you for coming in today  Labs were done today, if any labs are abnormal the clinic will call you No news is good news  INCREASE Torsemide 40 mg twice daily with 60 meq of Potassium for 3 days  02/14/2022 09/09/203 02/16/2022 Then go back on 02/17/2022 to Torsemide 40 mg daily and Potassium 40 meq daily   Your physician recommends that you schedule a follow-up appointment in:  3 weeks in clinic     Do the following things EVERYDAY: Weigh yourself in the morning before breakfast. Write it down and keep it in a log. Take your medicines as prescribed Eat low salt foods--Limit salt (sodium) to 2000 mg per day.  Stay as active as you can everyday Limit all fluids for the day to less than 2 liters  At the [facility], you and your health needs are our priority. As part of our continuing mission to provide you with exceptional heart care, we have created designated Provider Care Teams. These Care Teams include your primary Cardiologist (physician) and Advanced Practice Providers (APPs- Physician Assistants and Nurse Practitioners) who all work together to provide you with the care you need, when you need it.   You may see any of the following providers on your designated Care Team at your next follow up: Dr Arvilla Meres Dr Marca Ancona Dr. Marcos Eke, NP Robbie Lis, [place] [facility] [place], [place] Brynda Peon, NP Karle Plumber, PharmD   Please be sure to bring in all your medications bottles to every appointment.   If you have any questions or concerns before your next appointment please send [place] a message through [place] or call our office at [phone].    TO LEAVE A MESSAGE FOR THE NURSE SELECT OPTION 2, PLEASE LEAVE A MESSAGE INCLUDING: YOUR NAME DATE OF BIRTH CALL BACK NUMBER REASON FOR CALL**this is important as we prioritize the call backs  YOU WILL RECEIVE A CALL BACK THE SAME DAY AS LONG AS YOU CALL BEFORE 4:00  PM

## 2022-02-14 NOTE — Telephone Encounter (Signed)
-----   Message from Jacklynn Ganong, [place] sent at 02/14/2022  1:44 PM EDT ----- Labs ok, BNP elevated suggesting volume overload.  Please increase torsemide to 40 mg bid x 3 days, then resume at 60 mg daily (not 40 daily as previously discussed).  I will repeat BMET at his follow up in 2 weeks.

## 2022-02-14 NOTE — Progress Notes (Addendum)
ADVANCED HF CLINIC NOTE   Primary Care: [name], NP HF Cardiologist: Dr. Gala Romney  HPI: Keith Jenkins is a 76 y.o. male with history of COPD, prediabetes, GERD/dysphagia, HLD, and new diagnosis of CAD and systolic heart failure.  Admitted 4/23 with chest pain. Ruled in for inferior STEMI, late presenting. LHC showed total occlusion mRCA and 90% mLAD. RCA managed medically d/t completed infarct.  Plan to optimize medical therapy and arrange LAD intervention at later time. Echo showed EF 20-255%, RWMA, RV moderately reduced, moderate to severe ischemic MR, aortic root 4.3 cm, dilated IVC with estimated RAP 8 mmHg. AHF consulted and GDMT titrated, but limited due to soft blood pressure. CVTS consulted for CABG/MVR, instead of PCI to LAD, due to moderate to severe ischemic MR. Plan to optimize medically and improve strength, then arrange surgery a few weeks later. Discharged home, weight 217 lbs.   Post hospital follow up 5/23, mildly volume up, REDs 44%. Lasix 20 mg daily restarted. Plan to repeat echo in 1-2 weeks and coordinate timing of surgery.  Admitted 10/25/21 with a/c CHF. Stat echo showed EF 30-35%, RV moderately reduced, moderate pericardial effusion with no tamponade. Started on milrinone. Developed AFL and amiodarone gtt started. Underwent CABG x 2 (LIMA-LAD, SVG-RAMUS INT) +MVR, MAZE, LAA Clip. Hospitalization c/b distal esophageal stricture seen on barium swallow. GI consulted and plan for outpatient EGD/dilation. Diuresed with IV lasix. Drips weaned off, GDMT limited due to hypotension and need for midodrine. Discharged home, 202 lbs.  Today he returns for HF follow up with his wife and daughter. He fell yesterday and hit his head. Seen in ED, given IVF, CT head negative. He also had a fall 2 weeks ago, tripped over his feet. He is more SOB walking on flat ground. Has some LE swelling. Denies abnormal bleeding, CP, palpitations, or PND/Orthopnea. Appetite ok. No fever or  chills. Weight at home 192 pounds. Taking all medications, midodrine back up to 10 mg tid. He's frustrated, feels like he is going backwards with his recovery. Had to leave CR early due to low BP. [place]  Cardiac Studies: - Echo (5/23): EF 25%, RV moderately reduced, moderate to severe MR  - Echo (4/23): EF 20-255%, RWMA, RV moderately reduced, mioderate to severe ischemic MR, aortic root 4.3 cm, dilated IVC with estimated RAP 8 mmHg  - LHC (5/23): 100% m RCA and 90% m LAD. RCA managed medically d/t completed infarct.   ROS: All systems reviewed and negative except as per HPI.   Past Medical History:  Diagnosis Date   Aortic atherosclerosis (HCC)    Arthritis    CHF (congestive heart failure) (HCC)    Esophageal stricture    Glaucoma    MI (myocardial infarction) (HCC)    Osteoarthritis    Current Outpatient Medications  Medication Sig Dispense Refill   amiodarone (PACERONE) 200 MG tablet Take 0.5 tablets (100 mg total) by mouth daily. 45 tablet 1   aspirin EC 81 MG tablet Take 1 tablet (81 mg total) by mouth daily. Swallow whole. 90 tablet 1   atorvastatin (LIPITOR) 80 MG tablet Take 1 tablet (80 mg total) by mouth daily. 30 tablet 3   dapagliflozin propanediol (FARXIGA) 10 MG TABS tablet Take 1 tablet (10 mg total) by mouth daily. 90 tablet 1   digoxin (LANOXIN) 0.125 MG tablet Take 0.5 tablets (0.0625 mg total) by mouth daily. 15 tablet 3   escitalopram (LEXAPRO)  10 MG tablet TAKE ONE TABLET BY MOUTH ONCE DAILY 90 tablet 1   esomeprazole (NEXIUM) 40 MG capsule Take 40 mg by mouth daily.     ezetimibe (ZETIA) 10 MG tablet Take 0.5 tablets (5 mg total) by mouth daily. 15 tablet 3   Fluticasone-Umeclidin-Vilant (TRELEGY ELLIPTA) 100-62.5-25 MCG/ACT AEPB Inhale 1 puff into the lungs daily. 1 each 2   gabapentin (NEURONTIN) 300 MG capsule Take 1 capsule (300 mg total) by mouth at bedtime. 90 capsule 0   latanoprost (XALATAN) 0.005 %  ophthalmic solution Place 1 drop into both eyes at bedtime.     midodrine (PROAMATINE) 10 MG tablet Take 10 mg by mouth 3 (three) times daily.     OVER THE COUNTER MEDICATION Apply 1 application. topically daily as needed (For back pain). Medication: Magnilife Cream     potassium chloride SA (KLOR-CON M) 20 MEQ tablet Take 2 tablets (40 mEq total) by mouth daily. 30 tablet 6   spironolactone (ALDACTONE) 25 MG tablet Take 0.5 tablets (12.5 mg total) by mouth daily. 60 tablet 3   torsemide (DEMADEX) 20 MG tablet Take 2 tablets (40 mg total) by mouth daily. 60 tablet 3   warfarin (COUMADIN) 1 MG tablet Take 3 tablets daily. (3mg  daily) (Patient taking differently: Take 1 mg by mouth. Take 3 Monday Wednesday Friday (1 mg tablets. ) ,2 Saturday Sunday Tuesday Thursday.) 100 tablet 1   albuterol (VENTOLIN HFA) 108 (90 Base) MCG/ACT inhaler Inhale 2 puffs into the lungs every 6 (six) hours as needed for wheezing or shortness of breath. (Patient not taking: Reported on 02/04/2022) 8 g 0   nitroGLYCERIN (NITROSTAT) 0.4 MG SL tablet Place 1 tablet (0.4 mg total) under the tongue every 5 (five) minutes as needed for chest pain. (Patient not taking: Reported on 02/14/2022) 25 tablet 3   No current facility-administered medications for this encounter.   No Known Allergies  Social History   Socioeconomic History   Marital status: Married    Spouse name: Not on file   Number of children: Not on file   Years of education: Not on file   Highest education level: Not on file  Occupational History   Not on file  Tobacco Use   Smoking status: Former    Types: Cigarettes    Quit date: 1970    Years since quitting: 53.7   Smokeless tobacco: Never  Substance and Sexual Activity   Alcohol use: Not Currently   Drug use: Never   Sexual activity: Not on file  Other Topics Concern   Not on file  Social History Narrative   Not on file   Social Determinants of Health   Financial Resource Strain: Not on file   Food Insecurity: Not on file  Transportation Needs: Not on file  Physical Activity: Not on file  Stress: Not on file  Social Connections: Not on file  Intimate Partner Violence: Not on file   Family History  Problem Relation Age of Onset   Heart attack Mother    Hypertension Mother    Heart attack Father    Hypertension Father    Stomach cancer Sister    Brain cancer Sister    BP 108/66   Pulse 64   Ht 6\' 1"  (1.854 m)   Wt 90.4 kg (199 lb 6.4 oz)   SpO2 99%   BMI 26.31 kg/m   Wt Readings from Last 3 Encounters:  02/14/22 90.4 kg (199 lb 6.4 oz)  02/12/22 88 kg (194  lb 0.1 oz)  02/04/22 88.1 kg (194 lb 2 oz)   PHYSICAL EXAM: General:  NAD. No resp difficulty, arrived in [facility], staples to R scalp, fatigued-appearing. HEENT: Normal Neck: Supple. No JVD. Carotids 2+ bilat; no bruits. No lymphadenopathy or thryomegaly appreciated. Cor: PMI nondisplaced. Regular rate & rhythm. No rubs, gallops or murmurs. Lungs: Clear, diminished in bases. Abdomen: Soft, nontender, nondistended. No hepatosplenomegaly. No bruits or masses. Good bowel sounds. Extremities: No cyanosis, clubbing, rash, 1+ BLE edema R>L, compression hose on. Neuro: Alert & oriented x 3, cranial nerves grossly intact. Moves all 4 extremities w/o difficulty. Affect pleasant.  REDs: 53%  ECG (personally reviewed): NSR 62 bpm  ASSESSMENT & PLAN: 1. CAD/recent late presentation inferior STEMI - LHC 05/23: 100% m RCA and 90% m LAD.  - RCA managed medically d/t completed infarct.  - Echo (5/23) EF 25%, RV moderately reduced, moderate to severe MR - s/p CABG x 2 (LIMA-LAD, SVG-Ramus INT)  (11/01/21) - No chest pain. - Continue ASA + high intensity statin.  - CR in [place], but recent low BP has prevented him from participating.   2. Chronic systolic CHF with low-output/ICM - s/p late presentation inferior STEMI 05/23 - Echo (5/23): EF 25-30%, RV moderately reduced, moderate to severe MR. - Limited echo on admit  (5/23): EF 30-35%, RV moderately reduced, MR not well evaluated, moderate pericardial effusion - S/p CABG + bioprosthetic MVR 11/01/21 - worse NYHA III-IIIb, mildly volume up on exam and ReDS 53%. - Increase torsemide to 40 mg bid w/ extra 20 kcl x 3 days, then back to torsemide 40 mg daily. - Continue digoxin 0.0625.  Check Dig level today.  - Continue Farxiga 10 mg daily.  - Continue spiro 12.5 mg daily.  - Continue midodrine 10 mg tid for now.  - No room for arb/bb while on midodrine, add back as able.  - Labs today. - Repeat echo soon, discussed with Dr. Gala Romney.   3. Mitral regurgitation - Moderate to severe ischemic MR on TEE.  - S/p bioprosthetic MVR 11/02/21. - On warfarin per TCTS (3 months post valve). INR followed by [facility].    4. Aortic valve insufficiency - Mild to moderate AS/AI - Stable.    5. Pericardial effusion/Acute Pericarditis  - Moderate in size on echo, no respiratory variation or RV collapse to suggest tamponade. - Acute pericarditis/ adhensions noted at time of CABG. - No chest pain.   6. PAF - s/p MAZE w/ CABG  - NSR on ECG today. - Continue amiodarone 100 mg daily. Recent TSH and LFTs ok.  Needs annual eye exams  - On warfarin. Followed by coumadin clinic.  - Consider stopping amio next visit if in NSR  7. Esophageal stricture - Also has h/o esophageal stricture (was unable to pass TEE probe transgastric prior to CABG) - Barium swallow with distal stricture.  - Dr. Rhea Belton planning EGD with dilation. Per Dr. Gala Romney, OK to hold warfarin 5 day before procedure.   8. HLD - LDL 129>>79 on most recent check; LDL goal < 55. - Continue atorvastatin 80 mg daily + Zetia 5 mg daily. - Repeat FLP and HFTs in 6 wks. If not at goal, refer to lipid clinic for PCSK9i   9. Frequent Falls - ? Generalized physical deconditioning vs. Med side effects - Discussed fall precautions.  F/u w/ APP in 3 weeks. Repeat echo soon.  Anderson Malta [place],  FNP-BC 02/14/22

## 2022-02-14 NOTE — Telephone Encounter (Signed)
I attempted to reach patient to review results and recommendations per provider.  I left a message for a return call. 

## 2022-02-15 NOTE — Telephone Encounter (Signed)
Patient with diagnosis of Atrial fibrillation on warfarin for anticoagulation.    Procedure: eendoscopy Date of procedure: TBD   CHA2DS2-VASc Score = 3   This indicates a 3.2% annual risk of stroke. The patient's score is based upon: CHF History: 1 HTN History: 0 Diabetes History: 0 Stroke History: 0 Vascular Disease History: 1 Age Score: 1 Gender Score: 0    CrCl 51 Platelet count 285  Mitral valve replaced 11/02/21 (bioprosthetic) also MAZE and LAA clipping.   Has now been > 3 months on anticoagulation for valve replacement  Based on CHADS2-VASc score would not need to be bridged for 5 day hold, but as is just over 90 days will confirm with MD as to whether bridging needed or not.   **This guidance is not considered finalized until pre-operative APP has relayed final recommendations.**

## 2022-02-16 ENCOUNTER — Encounter (HOSPITAL_COMMUNITY): Payer: Self-pay | Admitting: Internal Medicine

## 2022-02-17 ENCOUNTER — Telehealth: Payer: Self-pay | Admitting: *Deleted

## 2022-02-17 ENCOUNTER — Other Ambulatory Visit: Payer: Self-pay | Admitting: *Deleted

## 2022-02-17 DIAGNOSIS — R1319 Other dysphagia: Secondary | ICD-10-CM

## 2022-02-17 DIAGNOSIS — R933 Abnormal findings on diagnostic imaging of other parts of digestive tract: Secondary | ICD-10-CM

## 2022-02-17 NOTE — Telephone Encounter (Signed)
   Patient Name: Keith Jenkins  DOB: [birth year] MRN: [id number]  Primary Cardiologist: None  Chart reviewed as part of pre-operative protocol coverage. Pre-op clearance already addressed by colleagues in earlier phone notes. To summarize recommendations:  -Mitral valve replaced 11/02/21 (bioprosthetic) also MAZE and LAA clipping.   Has now been > 3 months on anticoagulation for valve replacement  Based on CHADS2-VASc score would not need to be bridged for 5 day hold, Dr. Gala Romney agrees.   Will route this bundled recommendation to requesting provider via Epic fax function and remove from pre-op pool. Please call with questions.  Sharlene Dory, PA-C 02/17/2022, 4:35 PM

## 2022-02-17 NOTE — Telephone Encounter (Signed)
Per 02-15-22 Cardiology Note:  "7. Esophageal stricture - Also has h/o esophageal stricture (was unable to pass TEE probe transgastric prior to CABG) - Barium swallow with distal stricture.  - Dr. Rhea Belton planning EGD with dilation. Per Dr. Gala Romney, OK to hold warfarin 5 day before procedure.   F/u w/ APP in 3 weeks. Repeat echo soon.   Anderson Malta [place], FNP-BC 02/14/22"

## 2022-02-17 NOTE — Telephone Encounter (Signed)
-----   Message from Richardson Chiquito, [place] sent at 02/14/2022  1:55 PM EDT -----  ----- Message ----- From: Richardson Chiquito, CMA Sent: 02/14/2022   1:00 PM EDT To: Richardson Chiquito, CMA   ----- Message ----- From: Richardson Chiquito, CMA Sent: 02/14/2022  12:00 AM EDT To: Richardson Chiquito, CMA   ----- Message ----- From: Beverley Fiedler, MD Sent: 02/04/2022  12:34 PM EDT To: Richardson Chiquito, CMA  Dottie  See this note We need answer from cardiology regarding anticoagulation hold and then will need procedure in the outpatient hospital setting [facility]

## 2022-02-17 NOTE — Telephone Encounter (Signed)
I have advised patient that per Cardiology, he may hold warfarin 5 days prior to endoscopy procedure. Patient agreeable to 04/08/22 procedure. This has been scheduled for 04/08/22 at 830am, 700 am arrival at [facility]. Instructions including warfarin hold x 5 days prior to test have been created and these have been gone over in detail with the patient. He verbalizes understanding of the information. Written instructions have been made available via mychart and a paper copy has been sent to his home address as well.

## 2022-02-18 ENCOUNTER — Ambulatory Visit: Payer: Medicare HMO | Attending: Cardiology

## 2022-02-18 ENCOUNTER — Telehealth (HOSPITAL_COMMUNITY): Payer: Self-pay

## 2022-02-18 DIAGNOSIS — Z7901 Long term (current) use of anticoagulants: Secondary | ICD-10-CM

## 2022-02-18 DIAGNOSIS — Z952 Presence of prosthetic heart valve: Secondary | ICD-10-CM | POA: Diagnosis not present

## 2022-02-18 LAB — POCT INR: INR: 2.2 (ref 2.0–3.0)

## 2022-02-18 NOTE — Patient Instructions (Signed)
Description   START taking 2 tablets (2mg ) daily except 3 tablets (3mg ) on Mondays.  Recheck INR in 1 week. [facility] [phone]

## 2022-02-18 NOTE — Telephone Encounter (Signed)
Received this message from [name], RN at the Coumadin Clinic:  Hi Keith Jenkins! I just saw [name] in the Coumadin Clinic. I saw where you had recently spoke with him and I wasn't sure who the best contact is at your office. When he came in today, He stated he was short of breath and have gained over 5 pounds within the past 1-2 days. I saw where he was seen  they increased diuretic for a few days.   Please advise

## 2022-02-19 ENCOUNTER — Encounter: Payer: Self-pay | Admitting: Family Medicine

## 2022-02-19 ENCOUNTER — Other Ambulatory Visit (HOSPITAL_COMMUNITY): Payer: Self-pay

## 2022-02-19 ENCOUNTER — Ambulatory Visit (INDEPENDENT_AMBULATORY_CARE_PROVIDER_SITE_OTHER): Payer: Medicare HMO | Admitting: Family Medicine

## 2022-02-19 VITALS — BP 94/56 | HR 60 | Temp 97.4°F | Resp 16 | Ht 73.0 in | Wt 199.0 lb

## 2022-02-19 DIAGNOSIS — S0101XA Laceration without foreign body of scalp, initial encounter: Secondary | ICD-10-CM

## 2022-02-19 DIAGNOSIS — Z23 Encounter for immunization: Secondary | ICD-10-CM

## 2022-02-19 DIAGNOSIS — D509 Iron deficiency anemia, unspecified: Secondary | ICD-10-CM

## 2022-02-19 DIAGNOSIS — I5022 Chronic systolic (congestive) heart failure: Secondary | ICD-10-CM

## 2022-02-19 DIAGNOSIS — T671XXA Heat syncope, initial encounter: Secondary | ICD-10-CM | POA: Diagnosis not present

## 2022-02-19 DIAGNOSIS — S0003XA Contusion of scalp, initial encounter: Secondary | ICD-10-CM

## 2022-02-19 DIAGNOSIS — N1832 Chronic kidney disease, stage 3b: Secondary | ICD-10-CM

## 2022-02-19 DIAGNOSIS — W1830XA Fall on same level, unspecified, initial encounter: Secondary | ICD-10-CM | POA: Diagnosis not present

## 2022-02-19 NOTE — Progress Notes (Signed)
Orders Placed This Encounter  Procedures   ECHOCARDIOGRAM COMPLETE    Standing Status:   Future    Standing Expiration Date:   02/20/2023    Order Specific Question:   Where should this test be performed    Answer:   [facility]    Order Specific Question:   Perflutren DEFINITY (image enhancing agent) should be administered unless hypersensitivity or allergy exist    Answer:   Administer Perflutren    Order Specific Question:   Reason for exam-Echo    Answer:   Congestive Heart Failure  I50.9    Order Specific Question:   Release to patient    Answer:   Immediate
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
## 2022-02-19 NOTE — Telephone Encounter (Signed)
Patient and patients wife advised and verbalized understanding. Patient will call back on Friday to give [place] an update

## 2022-02-20 ENCOUNTER — Emergency Department (HOSPITAL_COMMUNITY): Payer: Medicare HMO

## 2022-02-20 ENCOUNTER — Encounter (HOSPITAL_COMMUNITY): Payer: Self-pay

## 2022-02-20 ENCOUNTER — Other Ambulatory Visit: Payer: Self-pay

## 2022-02-20 ENCOUNTER — Emergency Department (HOSPITAL_COMMUNITY)
Admission: EM | Admit: 2022-02-20 | Discharge: 2022-02-20 | Disposition: A | Discharge status: Home / Self Care | Payer: Medicare HMO | Attending: Emergency Medicine | Admitting: Emergency Medicine

## 2022-02-20 DIAGNOSIS — S199XXA Unspecified injury of neck, initial encounter: Secondary | ICD-10-CM | POA: Diagnosis not present

## 2022-02-20 DIAGNOSIS — J90 Pleural effusion, not elsewhere classified: Secondary | ICD-10-CM | POA: Insufficient documentation

## 2022-02-20 DIAGNOSIS — M4312 Spondylolisthesis, cervical region: Secondary | ICD-10-CM | POA: Diagnosis not present

## 2022-02-20 DIAGNOSIS — M19011 Primary osteoarthritis, right shoulder: Secondary | ICD-10-CM | POA: Diagnosis not present

## 2022-02-20 DIAGNOSIS — R42 Dizziness and giddiness: Secondary | ICD-10-CM | POA: Diagnosis not present

## 2022-02-20 DIAGNOSIS — M25511 Pain in right shoulder: Secondary | ICD-10-CM | POA: Insufficient documentation

## 2022-02-20 DIAGNOSIS — I878 Other specified disorders of veins: Secondary | ICD-10-CM | POA: Diagnosis not present

## 2022-02-20 DIAGNOSIS — I509 Heart failure, unspecified: Secondary | ICD-10-CM | POA: Insufficient documentation

## 2022-02-20 DIAGNOSIS — W1789XA Other fall from one level to another, initial encounter: Secondary | ICD-10-CM | POA: Diagnosis not present

## 2022-02-20 DIAGNOSIS — S022XXA Fracture of nasal bones, initial encounter for closed fracture: Secondary | ICD-10-CM | POA: Insufficient documentation

## 2022-02-20 DIAGNOSIS — S3993XA Unspecified injury of pelvis, initial encounter: Secondary | ICD-10-CM | POA: Diagnosis not present

## 2022-02-20 DIAGNOSIS — W19XXXA Unspecified fall, initial encounter: Secondary | ICD-10-CM

## 2022-02-20 DIAGNOSIS — S4991XA Unspecified injury of right shoulder and upper arm, initial encounter: Secondary | ICD-10-CM | POA: Diagnosis not present

## 2022-02-20 DIAGNOSIS — Z743 Need for continuous supervision: Secondary | ICD-10-CM | POA: Diagnosis not present

## 2022-02-20 DIAGNOSIS — J9811 Atelectasis: Secondary | ICD-10-CM | POA: Diagnosis not present

## 2022-02-20 DIAGNOSIS — R58 Hemorrhage, not elsewhere classified: Secondary | ICD-10-CM | POA: Diagnosis not present

## 2022-02-20 DIAGNOSIS — S0990XA Unspecified injury of head, initial encounter: Secondary | ICD-10-CM | POA: Diagnosis not present

## 2022-02-20 LAB — CBC WITH DIFFERENTIAL/PLATELET
Abs Immature Granulocytes: 0.03 10*3/uL (ref 0.00–0.07)
Basophils Absolute: 0.1 10*3/uL (ref 0.0–0.1)
Basophils Relative: 1 %
Eosinophils Absolute: 0.3 10*3/uL (ref 0.0–0.5)
Eosinophils Relative: 5 %
HCT: 26.3 % — ABNORMAL LOW (ref 39.0–52.0)
Hemoglobin: 8.3 g/dL — ABNORMAL LOW (ref 13.0–17.0)
Immature Granulocytes: 1 %
Lymphocytes Relative: 12 %
Lymphs Abs: 0.7 10*3/uL (ref 0.7–4.0)
MCH: 22.3 pg — ABNORMAL LOW (ref 26.0–34.0)
MCHC: 31.6 g/dL (ref 30.0–36.0)
MCV: 70.7 fL — ABNORMAL LOW (ref 80.0–100.0)
Monocytes Absolute: 0.8 10*3/uL (ref 0.1–1.0)
Monocytes Relative: 14 %
Neutro Abs: 3.9 10*3/uL (ref 1.7–7.7)
Neutrophils Relative %: 67 %
Platelets: 233 10*3/uL (ref 150–400)
RBC: 3.72 MIL/uL — ABNORMAL LOW (ref 4.22–5.81)
RDW: 19.5 % — ABNORMAL HIGH (ref 11.5–15.5)
WBC: 5.8 10*3/uL (ref 4.0–10.5)
nRBC: 0 % (ref 0.0–0.2)

## 2022-02-20 LAB — BASIC METABOLIC PANEL
Anion gap: 10 (ref 5–15)
BUN: 29 mg/dL — ABNORMAL HIGH (ref 8–23)
CO2: 26 mmol/L (ref 22–32)
Calcium: 8.5 mg/dL — ABNORMAL LOW (ref 8.9–10.3)
Chloride: 99 mmol/L (ref 98–111)
Creatinine, Ser: 1.67 mg/dL — ABNORMAL HIGH (ref 0.61–1.24)
GFR, Estimated: 43 mL/min — ABNORMAL LOW (ref >60)
Glucose, Bld: 96 mg/dL (ref 70–99)
Potassium: 3.3 mmol/L — ABNORMAL LOW (ref 3.5–5.1)
Sodium: 135 mmol/L (ref 135–145)

## 2022-02-20 LAB — PROTIME-INR
INR: 2 — ABNORMAL HIGH (ref 0.8–1.2)
Prothrombin Time: 22.9 seconds — ABNORMAL HIGH (ref 11.4–15.2)

## 2022-02-20 NOTE — ED Notes (Signed)
Transported to CT 

## 2022-02-20 NOTE — ED Provider Notes (Signed)
[facility] EMERGENCY DEPARTMENT Provider Note   CSN: [id number] Arrival date & time: 02/20/22  0800     History  Chief Complaint  Patient presents with   [facility] Keith Jenkins is a 76 y.o. male.  Patient here after fall at home.  Got up out of bed and had his cane with him.  Had a brief episode of feeling little bit off balance and lightheaded and then lost balance and fell to the floor.  He was able to get up and ambulate afterwards.  He is not having any dizziness or weakness or chest pain or shortness of breath or abdominal pain.  He had a fall couple days ago and required some staples to the right side of his head.  Seems that he might of hit this area again but does not appear to have any bleeding at that site anymore per EMS.  Patient does have a history of heart failure, on Coumadin.  Had coronary artery bypass few months ago.  Right now he is not feeling any pain or discomfort.  Does have some pain to the right shoulder however.  Denies any abdominal pain, hip pain, chest pain.  The history is provided by the patient.       Home Medications Prior to Admission medications   Medication Sig Start Date End Date Taking? Authorizing Provider  albuterol (VENTOLIN HFA) 108 (90 Base) MCG/ACT inhaler Inhale 2 puffs into the lungs every 6 (six) hours as needed for wheezing or shortness of breath. 10/09/21   Rip Harbour, NP  amiodarone (PACERONE) 200 MG tablet Take 0.5 tablets (100 mg total) by mouth daily. 12/04/21   Rafael Bihari, FNP  aspirin EC 81 MG tablet Take 1 tablet (81 mg total) by mouth daily. Swallow whole. 12/04/21   Milford, Maricela Bo, FNP  atorvastatin (LIPITOR) 80 MG tablet Take 1 tablet (80 mg total) by mouth daily. 01/06/22   Bensimhon, Shaune Pascal, MD  dapagliflozin propanediol (FARXIGA) 10 MG TABS tablet Take 1 tablet (10 mg total) by mouth daily. 12/27/21   Bensimhon, Shaune Pascal, MD  digoxin (LANOXIN) 0.125 MG tablet Take 0.5 tablets (0.0625 mg  total) by mouth daily. 01/29/22   Lyda Jester M, PA-C  escitalopram (LEXAPRO) 10 MG tablet TAKE ONE TABLET BY MOUTH ONCE DAILY 02/03/22   Cox, Elnita Maxwell, MD  esomeprazole (NEXIUM) 40 MG capsule Take 40 mg by mouth daily.    [provider]  ezetimibe (ZETIA) 10 MG tablet Take 0.5 tablets (5 mg total) by mouth daily. 01/14/22   Lyda Jester M, PA-C  Fluticasone-Umeclidin-Vilant (TRELEGY ELLIPTA) 100-62.5-25 MCG/ACT AEPB Inhale 1 puff into the lungs daily. 12/05/21   Rip Harbour, NP  gabapentin (NEURONTIN) 300 MG capsule Take 1 capsule (300 mg total) by mouth at bedtime. 01/22/22   Cox, Kirsten, MD  latanoprost (XALATAN) 0.005 % ophthalmic solution Place 1 drop into both eyes at bedtime. 06/10/21   [provider]  midodrine (PROAMATINE) 10 MG tablet Take 1 tablet (10 mg total) by mouth 3 (three) times daily. 02/14/22   Milford, Maricela Bo, FNP  nitroGLYCERIN (NITROSTAT) 0.4 MG SL tablet Place 1 tablet (0.4 mg total) under the tongue every 5 (five) minutes as needed for chest pain. 12/04/21 03/04/22  Rafael Bihari, FNP  OVER THE COUNTER MEDICATION Apply 1 application. topically daily as needed (For back pain). Medication: Magnilife Cream    [provider]  potassium chloride SA (KLOR-CON M) 20 MEQ tablet Take  2 tablets (40 mEq total) by mouth daily. 01/14/22   Consuelo Pandy, PA-C  spironolactone (ALDACTONE) 25 MG tablet Take 0.5 tablets (12.5 mg total) by mouth daily. 11/18/21   Barrett, Erin R, PA-C  torsemide (DEMADEX) 20 MG tablet Take 2 tablets (40 mg total) by mouth daily. 02/14/22   Rafael Bihari, FNP  warfarin (COUMADIN) 1 MG tablet Take 3 tablets daily. (3mg  daily) Patient taking differently: Take 1 mg by mouth. 2 tablets (2mg ) daily except 3 tablets (3 mg on Mondays. 01/28/22   Bensimhon, Shaune Pascal, MD      Allergies    Patient has no known allergies.    Review of Systems   Review of Systems  Physical Exam Updated Vital Signs BP 97/67   Pulse  66   Temp 98 F (36.7 C) (Oral)   Resp 15   Ht 6\' 1"  (1.854 m)   Wt 90.3 kg   SpO2 97%   BMI 26.26 kg/m  Physical Exam Vitals and nursing note reviewed.  Constitutional:      General: He is not in acute distress.    Appearance: He is well-developed. He is not ill-appearing.  HENT:     Head: Normocephalic and atraumatic.     Nose: Nose normal.     Mouth/Throat:     Mouth: Mucous membranes are moist.  Eyes:     Extraocular Movements: Extraocular movements intact.     Conjunctiva/sclera: Conjunctivae normal.     Pupils: Pupils are equal, round, and reactive to light.  Cardiovascular:     Rate and Rhythm: Normal rate and regular rhythm.     Heart sounds: No murmur heard. Pulmonary:     Effort: Pulmonary effort is normal. No respiratory distress.     Breath sounds: Normal breath sounds.  Abdominal:     Palpations: Abdomen is soft.     Tenderness: There is no abdominal tenderness.  Musculoskeletal:        General: Tenderness present. No swelling.     Cervical back: Normal range of motion and neck supple. No tenderness.     Comments: Tenderness to the right shoulder, no midline spinal tenderness  Skin:    General: Skin is warm and dry.     Capillary Refill: Capillary refill takes less than 2 seconds.     Comments: Prior old laceration site to the right side of his head with some dried blood but hemostatic, there is no age or dehiscence  Neurological:     General: No focal deficit present.     Mental Status: He is alert and oriented to person, place, and time.     Cranial Nerves: No cranial nerve deficit.     Sensory: No sensory deficit.     Motor: No weakness.     Coordination: Coordination normal.  Psychiatric:        Mood and Affect: Mood normal.     ED Results / Procedures / Treatments   Labs (all labs ordered are listed, but only abnormal results are displayed) Labs Reviewed  CBC WITH DIFFERENTIAL/PLATELET - Abnormal; Notable for the following components:       Result Value   RBC 3.72 (*)    Hemoglobin 8.3 (*)    HCT 26.3 (*)    MCV 70.7 (*)    MCH 22.3 (*)    RDW 19.5 (*)    All other components within normal limits  BASIC METABOLIC PANEL - Abnormal; Notable for the following components:   Potassium 3.3 (*)  BUN 29 (*)    Creatinine, Ser 1.67 (*)    Calcium 8.5 (*)    GFR, Estimated 43 (*)    All other components within normal limits  PROTIME-INR - Abnormal; Notable for the following components:   Prothrombin Time 22.9 (*)    INR 2.0 (*)    All other components within normal limits    EKG EKG Interpretation  Date/Time:  Thursday February 20 2022 08:12:21 EDT Ventricular Rate:  68 PR Interval:    QRS Duration: 111 QT Interval:  442 QTC Calculation: 471 R Axis:   94 Text Interpretation: Normal sinus rhythm Right axis deviation Low voltage, extremity leads Confirmed by Lennice Sites ([id number]) on 02/20/2022 8:17:27 AM  Radiology DG Shoulder Right  Result Date: 02/20/2022 CLINICAL DATA:  Fall this morning with right neck injury. EXAM: RIGHT SHOULDER - 2+ VIEW COMPARISON:  None Available. FINDINGS: The mineralization and alignment are normal. There is no evidence of acute fracture or dislocation. Mild acromioclavicular and glenohumeral degenerative changes. The subacromial space is preserved. No focal soft tissue abnormalities are identified. IMPRESSION: No acute osseous findings.  Mild degenerative changes. Electronically Signed   By: Richardean Sale M.D.   On: 02/20/2022 09:17   DG Pelvis 1-2 Views  Result Date: 02/20/2022 CLINICAL DATA:  Fall this morning with right neck injury. EXAM: PELVIS - 1-2 VIEW COMPARISON:  Abdominal radiographs 08/22/2020. FINDINGS: The mineralization and alignment are normal. There is no evidence of acute fracture or dislocation. The hip joint spaces are preserved. Numerous pelvic phleboliths are grossly unchanged. No acute soft tissue findings are identified. IMPRESSION: No evidence of acute pelvic fracture  or dislocation. Electronically Signed   By: Richardean Sale M.D.   On: 02/20/2022 09:16   DG Chest 1 View  Result Date: 02/20/2022 CLINICAL DATA:  Fall this morning with right neck injury. EXAM: CHEST  1 VIEW COMPARISON:  Radiographs 02/12/2022 and 01/28/2022.  CT 10/23/2021. FINDINGS: 0828 hours. The heart size and mediastinal contours are stable status post median sternotomy, mitral valve replacement and left atrial appendage clipping. No significant change in left greater than right pleural effusions with associated bibasilar pulmonary opacities. There is diffuse interstitial prominence which may be slightly greater in the right upper lobe today. No pneumothorax or acute osseous abnormality is identified. Telemetry leads overlie the chest. IMPRESSION: Possible mild increased interstitial prominence within the right upper lobe of the recent prior radiographs. This could reflect asymmetric edema or pulmonary contusion. No rib fracture or pneumothorax identified. Otherwise stable postoperative appearance of the chest with left to the right pleural effusions and bibasilar atelectasis. Electronically Signed   By: Richardean Sale M.D.   On: 02/20/2022 09:15   CT HEAD WO CONTRAST (5MM)  Result Date: 02/20/2022 CLINICAL DATA:  A 76 year old male presents following fall. EXAM: CT HEAD WITHOUT CONTRAST CT CERVICAL SPINE WITHOUT CONTRAST TECHNIQUE: Multidetector CT imaging of the head and cervical spine was performed following the standard protocol without intravenous contrast. Multiplanar CT image reconstructions of the cervical spine were also generated. RADIATION DOSE REDUCTION: This exam was performed according to the departmental dose-optimization program which includes automated exposure control, adjustment of the mA and/or kV according to patient size and/or use of iterative reconstruction technique. COMPARISON:  February 12, 2022 FINDINGS: CT HEAD FINDINGS Brain: No evidence of acute infarction, hemorrhage,  hydrocephalus, extra-axial collection or mass lesion/mass effect. Signs of mild atrophy and of chronic microvascular ischemic change without change since recent imaging. Vascular: No hyperdense vessel or unexpected calcification. Skull:  Normal. Negative for fracture or focal lesion. Sinuses/Orbits: Nasal bone fractures with RIGHT to LEFT septal deviation similar to previous imaging. Signs of LEFT lateral orbital wall fracture similar to previous imaging. No retro bulbar stranding. Signs of RIGHT maxillary sinusitis with chronic features, incompletely imaged. No air-fluid levels in the sinuses. Other: Incidental note is made of anterior subluxation of the LEFT greater than RIGHT mandibular condyle. This may be physiologic and is not substantially changed since August of 2023, appearing normal on the exam of February 12, 2022. CT CERVICAL SPINE FINDINGS Alignment: Minimal anterolisthesis of C4 on C5 is unchanged and seen in the setting of degenerative change. Straightening of normal cervical lordotic curvature similar prior exams likely related to a combination of patient position and or spasm. Skull base and vertebrae: No acute fracture. No primary bone lesion or focal pathologic process. Soft tissues and spinal canal: No prevertebral fluid or swelling. No visible canal hematoma. Disc levels: Multilevel degenerative changes in the cervical spine which are unchanged, degenerative changes manifest in both posterior elements and disc spaces signs of bony fusion at C2-3 and C4-5 of LEFT-sided facets. Marked facet arthropathy throughout the spine. Disc space narrowing and uncovertebral spurring greatest at C5-6 and C6-7. Upper chest: Signs of interstitial edema and bilateral effusions at the lung apices similar to previous imaging. Other: None IMPRESSION: No acute intracranial abnormality. Signs of prior fracture of the bilateral nasal bones with septal deviation and of the lateral wall of the LEFT orbit, unchanged. No  signs of acute traumatic injury to the cervical spine. Marked cervical spine degenerative changes. Chronic sinusitis. Electronically Signed   By: Donzetta Kohut M.D.   On: 02/20/2022 08:58   CT Cervical Spine Wo Contrast  Result Date: 02/20/2022 CLINICAL DATA:  A 76 year old male presents following fall. EXAM: CT HEAD WITHOUT CONTRAST CT CERVICAL SPINE WITHOUT CONTRAST TECHNIQUE: Multidetector CT imaging of the head and cervical spine was performed following the standard protocol without intravenous contrast. Multiplanar CT image reconstructions of the cervical spine were also generated. RADIATION DOSE REDUCTION: This exam was performed according to the departmental dose-optimization program which includes automated exposure control, adjustment of the mA and/or kV according to patient size and/or use of iterative reconstruction technique. COMPARISON:  February 12, 2022 FINDINGS: CT HEAD FINDINGS Brain: No evidence of acute infarction, hemorrhage, hydrocephalus, extra-axial collection or mass lesion/mass effect. Signs of mild atrophy and of chronic microvascular ischemic change without change since recent imaging. Vascular: No hyperdense vessel or unexpected calcification. Skull: Normal. Negative for fracture or focal lesion. Sinuses/Orbits: Nasal bone fractures with RIGHT to LEFT septal deviation similar to previous imaging. Signs of LEFT lateral orbital wall fracture similar to previous imaging. No retro bulbar stranding. Signs of RIGHT maxillary sinusitis with chronic features, incompletely imaged. No air-fluid levels in the sinuses. Other: Incidental note is made of anterior subluxation of the LEFT greater than RIGHT mandibular condyle. This may be physiologic and is not substantially changed since August of 2023, appearing normal on the exam of February 12, 2022. CT CERVICAL SPINE FINDINGS Alignment: Minimal anterolisthesis of C4 on C5 is unchanged and seen in the setting of degenerative change.  Straightening of normal cervical lordotic curvature similar prior exams likely related to a combination of patient position and or spasm. Skull base and vertebrae: No acute fracture. No primary bone lesion or focal pathologic process. Soft tissues and spinal canal: No prevertebral fluid or swelling. No visible canal hematoma. Disc levels: Multilevel degenerative changes in the cervical spine which are  unchanged, degenerative changes manifest in both posterior elements and disc spaces signs of bony fusion at C2-3 and C4-5 of LEFT-sided facets. Marked facet arthropathy throughout the spine. Disc space narrowing and uncovertebral spurring greatest at C5-6 and C6-7. Upper chest: Signs of interstitial edema and bilateral effusions at the lung apices similar to previous imaging. Other: None IMPRESSION: No acute intracranial abnormality. Signs of prior fracture of the bilateral nasal bones with septal deviation and of the lateral wall of the LEFT orbit, unchanged. No signs of acute traumatic injury to the cervical spine. Marked cervical spine degenerative changes. Chronic sinusitis. Electronically Signed   By: Donzetta Kohut M.D.   On: 02/20/2022 08:58    Procedures Procedures    Medications Ordered in ED Medications - No data to display  ED Course/ Medical Decision Making/ A&P                           Medical Decision Making Amount and/or Complexity of Data Reviewed Labs: ordered. Radiology: ordered.   Larell Layson is here after fall.  Patient on Coumadin.  History of CAD status post recent coronary artery bypass graft.  Patient with normal vitals.  No fever.  Level 2 trauma given fall on blood thinners.  Old laceration site to the right side of his head is hemostatic with no obvious dehiscence.  Seems like he had some bleeding from the site but no need for repair.  He has some right shoulder tenderness but otherwise no midline spinal tenderness.  Neurologically he is intact.  He was ambulatory after  his fall.  Sounds like he had a brief episode of dizziness/lightheadedness/imbalance while using his cane shortly after getting up out of his bed.  He does not have any dizziness or chest pain at this time.  He states he has been able to walk at home and to the bathroom here without any issues.  Given that he is on blood thinners however we will get a CT scan of his head and neck.  We will get x-rays of his chest, pelvis, right shoulder.  We will check basic labs.  Per my review and interpretation of labs there is no significant leukocytosis or anemia or electrolyte abnormality.  INR is 2.  Patient with CT scan of head and neck per radiology report with no acute findings.  Right shoulder x-ray negative for fracture per my review and interpretation.  Negative x-ray of the pelvis as well.  Chest x-ray per radiology report shows may be possible mild increase in prominence within the right upper lobe compared to recent chest x-rays.  Could be pulmonary contusion but there is no rib fracture or pneumothorax.  I am not concerned for infectious process or volume overload.  My suspicion likely contusion.  However is not having any major respiratory problems.  Overall he feels well.  Will discharge to home.  This chart was dictated using voice recognition software.  Despite best efforts to proofread,  errors can occur which can change the documentation meaning.         Final Clinical Impression(s) / ED Diagnoses Final diagnoses:  Fall, initial encounter    Rx / DC Orders ED Discharge Orders     None         Virgina Norfolk, DO 02/20/22 0940

## 2022-02-20 NOTE — ED Notes (Signed)
Help get patient on the monitor did ekg shown to Dr Lockie Mola patient is resting with call bell in reach

## 2022-02-20 NOTE — ED Triage Notes (Signed)
Pt bib [facility] from home with complaints of a ground level fall. Pt was trying to get to the bathroom, got dizzy and fell. Pt states he hit his head on the table in the same spot where he just got staples out yesterday for a previous fall. Pt arrives with controlled bleeding to a previous laceration. Denies LOC. Pt takes warfarin. On arrival to ED, pt is AOx4 and ambulatory.

## 2022-02-21 ENCOUNTER — Other Ambulatory Visit (HOSPITAL_COMMUNITY): Payer: Self-pay

## 2022-02-24 DIAGNOSIS — T671XXA Heat syncope, initial encounter: Secondary | ICD-10-CM | POA: Insufficient documentation

## 2022-02-24 DIAGNOSIS — N1832 Chronic kidney disease, stage 3b: Secondary | ICD-10-CM | POA: Insufficient documentation

## 2022-02-24 DIAGNOSIS — D509 Iron deficiency anemia, unspecified: Secondary | ICD-10-CM | POA: Insufficient documentation

## 2022-02-24 DIAGNOSIS — W1830XA Fall on same level, unspecified, initial encounter: Secondary | ICD-10-CM | POA: Insufficient documentation

## 2022-02-24 DIAGNOSIS — S0101XA Laceration without foreign body of scalp, initial encounter: Secondary | ICD-10-CM | POA: Insufficient documentation

## 2022-02-24 DIAGNOSIS — Z23 Encounter for immunization: Secondary | ICD-10-CM | POA: Insufficient documentation

## 2022-02-24 DIAGNOSIS — S0003XA Contusion of scalp, initial encounter: Secondary | ICD-10-CM | POA: Insufficient documentation

## 2022-02-24 NOTE — Assessment & Plan Note (Signed)
Resolved

## 2022-02-24 NOTE — Assessment & Plan Note (Signed)
Healed. Stapes removed.

## 2022-02-24 NOTE — Assessment & Plan Note (Signed)
Recommend caution with ambulation.

## 2022-02-24 NOTE — Assessment & Plan Note (Signed)
Recommended avoid heat.

## 2022-02-24 NOTE — Assessment & Plan Note (Signed)
Check cbc. Add on iron studies.

## 2022-02-24 NOTE — Assessment & Plan Note (Signed)
Stable

## 2022-02-25 ENCOUNTER — Ambulatory Visit: Payer: Medicare HMO | Attending: Cardiology

## 2022-02-25 DIAGNOSIS — Z952 Presence of prosthetic heart valve: Secondary | ICD-10-CM

## 2022-02-25 DIAGNOSIS — Z7901 Long term (current) use of anticoagulants: Secondary | ICD-10-CM

## 2022-02-25 LAB — POCT INR: INR: 1.8 — AB (ref 2.0–3.0)

## 2022-02-25 NOTE — Patient Instructions (Signed)
Description   Take 2.5 tablets today and then START taking 2 tablets (2mg ) daily except 3 tablets (3mg ) on Mondays and Fridays.  Recheck INR in 1 week. [facility] [phone]

## 2022-02-28 ENCOUNTER — Other Ambulatory Visit: Payer: Self-pay | Admitting: Nurse Practitioner

## 2022-02-28 DIAGNOSIS — R0600 Dyspnea, unspecified: Secondary | ICD-10-CM

## 2022-03-03 ENCOUNTER — Other Ambulatory Visit: Payer: Self-pay

## 2022-03-03 ENCOUNTER — Telehealth: Payer: Self-pay

## 2022-03-03 MED ORDER — TRAZODONE HCL 50 MG PO TABS: 50.0000 mg | ORAL_TABLET | Freq: Every day | ORAL | 2 refills | Status: DC

## 2022-03-03 NOTE — Telephone Encounter (Signed)
Patient was informed. Prescription was sent. He will try 1/2 a pill first.

## 2022-03-03 NOTE — Telephone Encounter (Signed)
Wife called stating patient is still having difficulty sleeping, increased gabapentin to 600 mg daily as suggested which has not helped, only sleeping 2-3 hours a night. Asking if something can be called in. Has appt 04/14/2022.

## 2022-03-04 ENCOUNTER — Ambulatory Visit: Payer: Medicare HMO | Attending: Cardiology

## 2022-03-04 ENCOUNTER — Telehealth: Payer: Self-pay

## 2022-03-04 DIAGNOSIS — Z7901 Long term (current) use of anticoagulants: Secondary | ICD-10-CM

## 2022-03-04 DIAGNOSIS — Z952 Presence of prosthetic heart valve: Secondary | ICD-10-CM | POA: Diagnosis not present

## 2022-03-04 LAB — POCT INR: INR: 2.1 (ref 2.0–3.0)

## 2022-03-04 NOTE — Telephone Encounter (Signed)
Patient's daughter called with concerns about the trazodone in combination with his lexapro and the gabapentin.  Dr. Tobie Poet is aware of his medications and feels that he should be able to tolerate the lower dose of trazodone.  He took the 25 mg last night and rested well without any adverse effects.  She was encouraged to call us back if he has any problems.

## 2022-03-04 NOTE — Patient Instructions (Signed)
Description   Continue taking 2 tablets (2mg) daily except 3 tablets (3mg) on Mondays and Fridays.  Recheck INR in 1 week. [facility] #[phone]      

## 2022-03-10 ENCOUNTER — Other Ambulatory Visit: Payer: Self-pay | Admitting: Internal Medicine

## 2022-03-10 DIAGNOSIS — Z952 Presence of prosthetic heart valve: Secondary | ICD-10-CM

## 2022-03-10 NOTE — Progress Notes (Signed)
ADVANCED HF CLINIC NOTE   Primary Care: Rip Harbour, NP HF Cardiologist: Dr. Haroldine Laws  HPI: Keith Jenkins is a 76 y.o. male with history of COPD, prediabetes, GERD/dysphagia, HLD, and new diagnosis of CAD and systolic heart failure.  Admitted 4/23 with chest pain. Ruled in for inferior STEMI, late presenting. LHC showed total occlusion mRCA and 90% mLAD. RCA managed medically d/t completed infarct.  Plan to optimize medical therapy and arrange LAD intervention at later time. Echo showed EF 20-255%, RWMA, RV moderately reduced, moderate to severe ischemic MR, aortic root 4.3 cm, dilated IVC with estimated RAP 8 mmHg. AHF consulted and GDMT titrated, but limited due to soft blood pressure. CVTS consulted for CABG/MVR, instead of PCI to LAD, due to moderate to severe ischemic MR. Plan to optimize medically and improve strength, then arrange surgery a few weeks later. Discharged home, weight 217 lbs.   Post hospital follow up 5/23, mildly volume up, REDs 44%. Lasix 20 mg daily restarted. Plan to repeat echo in 1-2 weeks and coordinate timing of surgery.  Admitted 10/25/21 with a/c CHF. Stat echo showed EF 30-35%, RV moderately reduced, moderate pericardial effusion with no tamponade. Started on milrinone. Developed AFL and amiodarone gtt started. Underwent CABG x 2 (LIMA-LAD, SVG-RAMUS INT) +MVR, MAZE, LAA Clip. Hospitalization c/b distal esophageal stricture seen on barium swallow. GI consulted and plan for outpatient EGD/dilation. Diuresed with IV lasix. Drips weaned off, GDMT limited due to hypotension and need for midodrine. Discharged home, 202 lbs.  Seen in ED 02/12/22 with fall. Given IVF, CT head negative. He also had a fall 2 prior, tripped over his feet.   Follow up 9/23, continued to feel weak with LE swelling. He was unable to complete CR due to low BP. ReDs 53%, torsemide increased to 40 bid x 3 days then back to 40 daily. Arranged for repeat echo.  Seen in ED 02/20/22 for fall  after becoming light-headed. CT head and neck negative for acute abnormality.  Today he returns for HF follow up with his wife and daughter. Overall feeling poorly. Had another fall after getting up in the middle night to urinate, did not hit head. Continues with LE swelling and + cough. Has chest heaviness. Denies palpitations, abnormal bleeding. + Orthopnea.  Appetite poor. No fever or chills. Weight at home 199-201 pounds. Taking all medications.   Echo today 03/12/22 EF 20-25%, severe LV dysfunction with global HK, + large Left pleural effusion, RV moderately down, moderate aortic stenosis, suspect component of low gradient AS, dimensionless index = 0.31.Reviewed by Dr. Haroldine Laws.  Cardiac Studies: - Echo (5/23): EF 25%, RV moderately reduced, moderate to severe MR  - Echo (4/23): EF 20-25%, RWMA, RV moderately reduced, mioderate to severe ischemic MR, aortic root 4.3 cm, dilated IVC with estimated RAP 8 mmHg  - LHC (5/23): 100% m RCA and 90% m LAD. RCA managed medically d/t completed infarct.   ROS: All systems reviewed and negative except as per HPI.   Past Medical History:  Diagnosis Date   Aortic atherosclerosis (HCC)    Arthritis    CHF (congestive heart failure) (HCC)    Esophageal stricture    Glaucoma    MI (myocardial infarction) (HCC)    Osteoarthritis    Current Outpatient Medications  Medication Sig Dispense Refill   albuterol (VENTOLIN HFA) 108 (90 Base) MCG/ACT inhaler Inhale 2 puffs into the lungs every 6 (six) hours as needed for wheezing or shortness of breath. 6.7 g 0  amiodarone (PACERONE) 200 MG tablet Take 0.5 tablets (100 mg total) by mouth daily. 45 tablet 1   aspirin EC 81 MG tablet Take 1 tablet (81 mg total) by mouth daily. Swallow whole. 90 tablet 1   atorvastatin (LIPITOR) 80 MG tablet Take 1 tablet (80 mg total) by mouth daily. 30 tablet 3   dapagliflozin propanediol (FARXIGA) 10 MG TABS tablet Take 1 tablet (10 mg total) by mouth daily. 90 tablet 1    digoxin (LANOXIN) 0.125 MG tablet Take 0.5 tablets (0.0625 mg total) by mouth daily. 15 tablet 3   escitalopram (LEXAPRO) 10 MG tablet TAKE ONE TABLET BY MOUTH ONCE DAILY 90 tablet 1   esomeprazole (NEXIUM) 40 MG capsule Take 40 mg by mouth daily.     ezetimibe (ZETIA) 10 MG tablet Take 0.5 tablets (5 mg total) by mouth daily. 15 tablet 3   Fluticasone-Umeclidin-Vilant (TRELEGY ELLIPTA) 100-62.5-25 MCG/ACT AEPB Inhale 1 puff into the lungs daily. 1 each 2   GABAPENTIN PO Take 300 mg by mouth at bedtime.     latanoprost (XALATAN) 0.005 % ophthalmic solution Place 1 drop into both eyes at bedtime.     midodrine (PROAMATINE) 10 MG tablet Take 1 tablet (10 mg total) by mouth 3 (three) times daily. 90 tablet 6   nitroGLYCERIN (NITROSTAT) 0.4 MG SL tablet Place 1 tablet (0.4 mg total) under the tongue every 5 (five) minutes as needed for chest pain. 25 tablet 3   OVER THE COUNTER MEDICATION Apply 1 application. topically daily as needed (For back pain). Medication: Magnilife Cream     potassium chloride SA (KLOR-CON M) 20 MEQ tablet Take 2 tablets (40 mEq total) by mouth daily. 30 tablet 6   spironolactone (ALDACTONE) 25 MG tablet Take 0.5 tablets (12.5 mg total) by mouth daily. 60 tablet 3   torsemide (DEMADEX) 20 MG tablet Take 2 tablets (40 mg total) by mouth daily. 60 tablet 3   traZODone (DESYREL) 50 MG tablet Take 1 tablet (50 mg total) by mouth at bedtime. 30 tablet 2   warfarin (COUMADIN) 1 MG tablet Patient takes 3 tablets by mouth on Monday and Fridays and 2 tablets all other days.     No current facility-administered medications for this encounter.   No Known Allergies  Social History   Socioeconomic History   Marital status: Married    Spouse name: Not on file   Number of children: Not on file   Years of education: Not on file   Highest education level: Not on file  Occupational History   Not on file  Tobacco Use   Smoking status: Former    Types: Cigarettes    Quit date: 1970     Years since quitting: 53.7   Smokeless tobacco: Never  Substance and Sexual Activity   Alcohol use: Not Currently   Drug use: Never   Sexual activity: Not on file  Other Topics Concern   Not on file  Social History Narrative   Not on file   Social Determinants of Health   Financial Resource Strain: Not on file  Food Insecurity: Not on file  Transportation Needs: Not on file  Physical Activity: Not on file  Stress: Not on file  Social Connections: Not on file  Intimate Partner Violence: Not on file   Family History  Problem Relation Age of Onset   Heart attack Mother    Hypertension Mother    Heart attack Father    Hypertension Father    Stomach cancer  Sister    Brain cancer Sister    BP 108/68   Pulse (!) 59   Wt 91.3 kg (201 lb 3.2 oz)   SpO2 97%   BMI 26.55 kg/m   Wt Readings from Last 3 Encounters:  03/12/22 91.3 kg (201 lb 3.2 oz)  02/20/22 90.3 kg (199 lb 1.2 oz)  02/19/22 90.3 kg (199 lb)   PHYSICAL EXAM: General:  NAD. No resp difficulty, pale, arrived in [facility] HEENT: Normal Neck: Supple. JVP to ear Carotids 2+ bilat; no bruits. No lymphadenopathy or thryomegaly appreciated. Cor: PMI nondisplaced. Regular rate & rhythm. No rubs, gallops, 2/6 AS Lungs: Bibasilar crackles. Abdomen: Soft, nontender, nondistended. No hepatosplenomegaly. No bruits or masses. Good bowel sounds. Extremities: No cyanosis, clubbing, rash, 2+ BLE edema to knees Neuro: Alert & oriented x 3, cranial nerves grossly intact. Moves all 4 extremities w/o difficulty. Affect pleasant.  REDs: 47%  ECG (personally reviewed): SB 57 bpm, QTc 459 msec  ASSESSMENT & PLAN: 1. Acute on Chronic systolic CHF /ICM - s/p late presentation inferior STEMI 05/23 - Echo (5/23): EF 25-30%, RV moderately reduced, moderate to severe MR. - Limited echo on admit (5/23): EF 30-35%, RV moderately reduced, MR not well evaluated, moderate pericardial effusion - S/p CABG + bioprosthetic MVR 11/01/21 - Echo  today 03/12/22 showed EF 20-25%, severe LV dysfunction with global HK, + large Left pleural effusion, RV moderately down, moderate aortic stenosis, suspect component of low gradient AS. - Worse NYHA IV, volume remains up despite escalation in diuretics. REDs 47%. Concern for low output. - Needs admission for IV diuresis with Lasix IV 80 mg bid, placed on bed wait list. - Will give Furoscix 80 mg x 1 + extra 40 KCL today while waiting for bed (hold po torsemide while using Furoscix) - Tomorrow, OK to resume torsemide 40 mg bid. - Continue digoxin 0.0625 mg daily.  - Continue Farxiga 10 mg daily.  - Continue spiro 12.5 mg daily.  - Continue midodrine 10 mg tid. - No beta blocker with decompensation/volume overload. - No BP room for ARB/ARNi - CMET, CBC, TSH, BNP, dig level and lactic acid today. - Will need Unna boots.  2. CAD/recent late presentation inferior STEMI - LHC 05/23: 100% m RCA and 90% m LAD.  - RCA managed medically d/t completed infarct.  - Echo (5/23) EF 25%, RV moderately reduced, moderate to severe MR - s/p CABG x 2 (LIMA-LAD, SVG-Ramus INT)  (11/01/21) - Having chest pressure, likely due to volume overload. ECG OK today. - Continue ASA + high intensity statin.   3. Mitral regurgitation - Moderate to severe ischemic MR on TEE.  - S/p bioprosthetic MVR 11/02/21. - On warfarin per TCTS (3 months post valve). INR followed by [facility].    4. Aortic valve insufficiency - Moderate on echo today.   5. H/o Pericardial effusion/Acute Pericarditis  - Moderate in size on past echo, w/o tamponade. - Acute pericarditis/ adhensions noted at time of CABG. - Resolved on today's echo.   6. PAF - s/p MAZE w/ CABG  - NSR on ECG today. - On warfarin. Followed by coumadin clinic.  - Continue amiodarone 100 mg daily. Recent TSH and LFTs ok.  Needs annual eye exams. Will keep on board for now, suspect he will not tolerate atrial fibrillation.  7. Left pleural effusion -  Large on echo today. - Diurese as above.  8. Esophageal stricture - Also has h/o esophageal stricture (was unable to pass TEE probe  transgastric prior to CABG) - Barium swallow with distal stricture.  - Dr. Hilarie Fredrickson planning EGD with dilation. Need to hold off for right now with decompensation.   9. HLD - LDL 129>>79 on most recent check; LDL goal < 55. - Continue atorvastatin 80 mg daily + Zetia 5 mg daily. - Repeat FLP and HFTs in 6 wks. If not at goal, refer to lipid clinic for [id number]   10. Frequent Falls - Discussed fall precautions.  Needs admission to [facility] for IV diuresis (Lasix IV 80 mg bid, unna boots, PICC placement and to follow Co-Ox). No beds available at this time and he was placed on admission waiting list. Discussed signs/symptoms to go to ED with patient, wife and his daughter, should he acutely decompensate.  [place], FNP-BC 03/12/22  Greater than 50% of the (total minutes 50) visit spent in counseling/coordination of care regarding (volume status, results of today's echo and plans for admission for diuresis and possible inotropic support).

## 2022-03-10 NOTE — Telephone Encounter (Signed)
Prescription refill request received for warfarin Lov: 02/14/22  Next INR check: 03/11/22 Warfarin tablet strength: 1mg   Appropriate dose and refill sent to requested pharmacy.

## 2022-03-11 ENCOUNTER — Ambulatory Visit (INDEPENDENT_AMBULATORY_CARE_PROVIDER_SITE_OTHER): Payer: Medicare HMO

## 2022-03-11 ENCOUNTER — Telehealth: Payer: Self-pay | Admitting: *Deleted

## 2022-03-11 DIAGNOSIS — Z952 Presence of prosthetic heart valve: Secondary | ICD-10-CM

## 2022-03-11 DIAGNOSIS — Z7901 Long term (current) use of anticoagulants: Secondary | ICD-10-CM | POA: Diagnosis not present

## 2022-03-11 LAB — POCT INR: INR: 2.2 (ref 2.0–3.0)

## 2022-03-11 NOTE — Telephone Encounter (Signed)
   Pre-operative Risk Assessment    Patient Name: Keith Jenkins  DOB: 1946-03-22 MRN: [id number]      Request for Surgical Clearance    Procedure:   EGD  Date of Surgery:  Clearance 04/08/22                                 Surgeon:  DR. PYRTLE Surgeon's Group or Practice Name: [facility] Phone number:  [phone] Fax number:  [phone]   Type of Clearance Requested:   - Medical  - Pharmacy:  Hold Warfarin (Coumadin)     Type of Anesthesia:  MAC/PROPOFOL    Additional requests/questions:    Jiles Prows   03/11/2022, 1:26 PM

## 2022-03-11 NOTE — Telephone Encounter (Signed)
Routing to pharmacy for clearance to hold warfarin. Patient is managed by AHF clinic. Clearance will need to be given by AHF providers.

## 2022-03-11 NOTE — Patient Instructions (Signed)
Description   Continue taking 2 tablets (2mg ) daily except 3 tablets (3mg ) on Mondays and Fridays.  Recheck INR in 1 week. [facility] [phone]

## 2022-03-12 ENCOUNTER — Encounter (HOSPITAL_COMMUNITY): Payer: Self-pay | Admitting: Internal Medicine

## 2022-03-12 ENCOUNTER — Encounter (HOSPITAL_COMMUNITY): Payer: Self-pay

## 2022-03-12 ENCOUNTER — Inpatient Hospital Stay: Payer: Self-pay

## 2022-03-12 ENCOUNTER — Ambulatory Visit (HOSPITAL_COMMUNITY)
Admission: RE | Admit: 2022-03-12 | Discharge: 2022-03-12 | Disposition: A | Discharge status: Home / Self Care | Payer: Medicare HMO | Source: Ambulatory Visit

## 2022-03-12 ENCOUNTER — Inpatient Hospital Stay (HOSPITAL_COMMUNITY)
Admission: RE | Admit: 2022-03-12 | Discharge: 2022-03-18 | DRG: 287 | Disposition: A | Discharge status: Home Health | Payer: Medicare HMO | Attending: Internal Medicine | Admitting: Internal Medicine

## 2022-03-12 ENCOUNTER — Ambulatory Visit (HOSPITAL_BASED_OUTPATIENT_CLINIC_OR_DEPARTMENT_OTHER)
Admission: RE | Admit: 2022-03-12 | Discharge: 2022-03-12 | Disposition: A | Discharge status: Home / Self Care | Payer: Medicare HMO | Source: Ambulatory Visit | Attending: Family Medicine | Admitting: Family Medicine

## 2022-03-12 VITALS — BP 108/68 | HR 59 | Wt 201.2 lb

## 2022-03-12 DIAGNOSIS — Z515 Encounter for palliative care: Secondary | ICD-10-CM | POA: Diagnosis not present

## 2022-03-12 DIAGNOSIS — Z7984 Long term (current) use of oral hypoglycemic drugs: Secondary | ICD-10-CM | POA: Diagnosis not present

## 2022-03-12 DIAGNOSIS — J449 Chronic obstructive pulmonary disease, unspecified: Secondary | ICD-10-CM | POA: Diagnosis present

## 2022-03-12 DIAGNOSIS — Z6822 Body mass index (BMI) 22.0-22.9, adult: Secondary | ICD-10-CM

## 2022-03-12 DIAGNOSIS — I5021 Acute systolic (congestive) heart failure: Secondary | ICD-10-CM | POA: Diagnosis not present

## 2022-03-12 DIAGNOSIS — I480 Paroxysmal atrial fibrillation: Secondary | ICD-10-CM

## 2022-03-12 DIAGNOSIS — Z7982 Long term (current) use of aspirin: Secondary | ICD-10-CM | POA: Diagnosis not present

## 2022-03-12 DIAGNOSIS — Z953 Presence of xenogenic heart valve: Secondary | ICD-10-CM | POA: Diagnosis not present

## 2022-03-12 DIAGNOSIS — E785 Hyperlipidemia, unspecified: Secondary | ICD-10-CM

## 2022-03-12 DIAGNOSIS — I700 Atherosclerosis of aorta: Secondary | ICD-10-CM | POA: Diagnosis present

## 2022-03-12 DIAGNOSIS — Z7189 Other specified counseling: Secondary | ICD-10-CM | POA: Diagnosis not present

## 2022-03-12 DIAGNOSIS — Z952 Presence of prosthetic heart valve: Secondary | ICD-10-CM

## 2022-03-12 DIAGNOSIS — K222 Esophageal obstruction: Secondary | ICD-10-CM | POA: Diagnosis not present

## 2022-03-12 DIAGNOSIS — Z7901 Long term (current) use of anticoagulants: Secondary | ICD-10-CM | POA: Diagnosis not present

## 2022-03-12 DIAGNOSIS — Z8249 Family history of ischemic heart disease and other diseases of the circulatory system: Secondary | ICD-10-CM

## 2022-03-12 DIAGNOSIS — R5381 Other malaise: Secondary | ICD-10-CM

## 2022-03-12 DIAGNOSIS — K219 Gastro-esophageal reflux disease without esophagitis: Secondary | ICD-10-CM | POA: Diagnosis present

## 2022-03-12 DIAGNOSIS — H409 Unspecified glaucoma: Secondary | ICD-10-CM | POA: Diagnosis present

## 2022-03-12 DIAGNOSIS — R0602 Shortness of breath: Secondary | ICD-10-CM | POA: Diagnosis not present

## 2022-03-12 DIAGNOSIS — W19XXXS Unspecified fall, sequela: Secondary | ICD-10-CM | POA: Diagnosis not present

## 2022-03-12 DIAGNOSIS — J90 Pleural effusion, not elsewhere classified: Secondary | ICD-10-CM

## 2022-03-12 DIAGNOSIS — I2510 Atherosclerotic heart disease of native coronary artery without angina pectoris: Secondary | ICD-10-CM | POA: Diagnosis not present

## 2022-03-12 DIAGNOSIS — Z79899 Other long term (current) drug therapy: Secondary | ICD-10-CM | POA: Diagnosis not present

## 2022-03-12 DIAGNOSIS — I351 Nonrheumatic aortic (valve) insufficiency: Secondary | ICD-10-CM

## 2022-03-12 DIAGNOSIS — I350 Nonrheumatic aortic (valve) stenosis: Secondary | ICD-10-CM | POA: Diagnosis present

## 2022-03-12 DIAGNOSIS — I5023 Acute on chronic systolic (congestive) heart failure: Secondary | ICD-10-CM

## 2022-03-12 DIAGNOSIS — Z951 Presence of aortocoronary bypass graft: Secondary | ICD-10-CM | POA: Diagnosis not present

## 2022-03-12 DIAGNOSIS — I5022 Chronic systolic (congestive) heart failure: Secondary | ICD-10-CM | POA: Insufficient documentation

## 2022-03-12 DIAGNOSIS — J918 Pleural effusion in other conditions classified elsewhere: Secondary | ICD-10-CM | POA: Diagnosis present

## 2022-03-12 DIAGNOSIS — Z7951 Long term (current) use of inhaled steroids: Secondary | ICD-10-CM | POA: Diagnosis not present

## 2022-03-12 DIAGNOSIS — Z87891 Personal history of nicotine dependence: Secondary | ICD-10-CM

## 2022-03-12 DIAGNOSIS — R1310 Dysphagia, unspecified: Secondary | ICD-10-CM | POA: Diagnosis not present

## 2022-03-12 DIAGNOSIS — I252 Old myocardial infarction: Secondary | ICD-10-CM | POA: Diagnosis not present

## 2022-03-12 DIAGNOSIS — I509 Heart failure, unspecified: Secondary | ICD-10-CM

## 2022-03-12 DIAGNOSIS — I3139 Other pericardial effusion (noninflammatory): Secondary | ICD-10-CM

## 2022-03-12 DIAGNOSIS — R296 Repeated falls: Secondary | ICD-10-CM | POA: Diagnosis not present

## 2022-03-12 DIAGNOSIS — D509 Iron deficiency anemia, unspecified: Secondary | ICD-10-CM | POA: Diagnosis not present

## 2022-03-12 DIAGNOSIS — E440 Moderate protein-calorie malnutrition: Secondary | ICD-10-CM | POA: Diagnosis not present

## 2022-03-12 LAB — ECHOCARDIOGRAM COMPLETE
AR max vel: 1.32 cm2
AV Area VTI: 1.27 cm2
AV Area mean vel: 1.27 cm2
AV Mean grad: 13.2 mmHg
AV Peak grad: 25.5 mmHg
AV Vena cont: 0.3 cm
Ao pk vel: 2.52 m/s
Area-P 1/2: 2.45 cm2
Calc EF: 31.1 %
MV VTI: 0.95 cm2
P 1/2 time: 497 msec
S' Lateral: 4.9 cm
Single Plane A2C EF: 29.6 %
Single Plane A4C EF: 34 %

## 2022-03-12 LAB — COMPREHENSIVE METABOLIC PANEL
ALT: 30 U/L (ref 0–44)
AST: 35 U/L (ref 15–41)
Albumin: 3.2 g/dL — ABNORMAL LOW (ref 3.5–5.0)
Alkaline Phosphatase: 90 U/L (ref 38–126)
Anion gap: 11 (ref 5–15)
BUN: 34 mg/dL — ABNORMAL HIGH (ref 8–23)
CO2: 25 mmol/L (ref 22–32)
Calcium: 8.9 mg/dL (ref 8.9–10.3)
Chloride: 102 mmol/L (ref 98–111)
Creatinine, Ser: 1.96 mg/dL — ABNORMAL HIGH (ref 0.61–1.24)
GFR, Estimated: 35 mL/min — ABNORMAL LOW (ref >60)
Glucose, Bld: 95 mg/dL (ref 70–99)
Potassium: 3.6 mmol/L (ref 3.5–5.1)
Sodium: 138 mmol/L (ref 135–145)
Total Bilirubin: 1.1 mg/dL (ref 0.3–1.2)
Total Protein: 6.7 g/dL (ref 6.5–8.1)

## 2022-03-12 LAB — CBC
HCT: 26.9 % — ABNORMAL LOW (ref 39.0–52.0)
Hemoglobin: 8.5 g/dL — ABNORMAL LOW (ref 13.0–17.0)
MCH: 21.5 pg — ABNORMAL LOW (ref 26.0–34.0)
MCHC: 31.6 g/dL (ref 30.0–36.0)
MCV: 67.9 fL — ABNORMAL LOW (ref 80.0–100.0)
Platelets: 238 10*3/uL (ref 150–400)
RBC: 3.96 MIL/uL — ABNORMAL LOW (ref 4.22–5.81)
RDW: 19.2 % — ABNORMAL HIGH (ref 11.5–15.5)
WBC: 5.3 10*3/uL (ref 4.0–10.5)
nRBC: 0 % (ref 0.0–0.2)

## 2022-03-12 LAB — PROTIME-INR
INR: 2.3 — ABNORMAL HIGH (ref 0.8–1.2)
Prothrombin Time: 24.9 seconds — ABNORMAL HIGH (ref 11.4–15.2)

## 2022-03-12 LAB — COOXEMETRY PANEL
Carboxyhemoglobin: 1.9 % — ABNORMAL HIGH (ref 0.5–1.5)
Methemoglobin: <0.7 % (ref 0.0–1.5)
O2 Saturation: 61.1 %
Total hemoglobin: <6.9 g/dL — CL (ref 12.0–16.0)

## 2022-03-12 LAB — BRAIN NATRIURETIC PEPTIDE: B Natriuretic Peptide: 1417.5 pg/mL — ABNORMAL HIGH (ref 0.0–100.0)

## 2022-03-12 LAB — TSH: TSH: 0.691 u[IU]/mL (ref 0.350–4.500)

## 2022-03-12 LAB — MRSA NEXT GEN BY PCR, NASAL: MRSA by PCR Next Gen: NOT DETECTED

## 2022-03-12 LAB — LACTIC ACID, PLASMA: Lactic Acid, Venous: 1.1 mmol/L (ref 0.5–1.9)

## 2022-03-12 MED ORDER — SODIUM CHLORIDE 0.9% FLUSH: 10.0000 mL | INTRAVENOUS | Status: DC | PRN

## 2022-03-12 MED ORDER — DIGOXIN 125 MCG PO TABS
0.0625 mg | ORAL_TABLET | Freq: Every day | ORAL | Status: DC
  Administered 2022-03-13 – 2022-03-18 (×6): 0.0625 mg via ORAL
  Filled 2022-03-12 (×6): qty 1

## 2022-03-12 MED ORDER — SODIUM CHLORIDE 0.9% FLUSH
3.0000 mL | Freq: Two times a day (BID) | INTRAVENOUS | Status: DC
  Administered 2022-03-13 – 2022-03-17 (×8): 3 mL via INTRAVENOUS

## 2022-03-12 MED ORDER — DAPAGLIFLOZIN PROPANEDIOL 10 MG PO TABS
10.0000 mg | ORAL_TABLET | Freq: Every day | ORAL | Status: DC
  Administered 2022-03-13 – 2022-03-18 (×6): 10 mg via ORAL
  Filled 2022-03-12 (×6): qty 1

## 2022-03-12 MED ORDER — SODIUM CHLORIDE 0.9% FLUSH: 3.0000 mL | INTRAVENOUS | Status: DC | PRN

## 2022-03-12 MED ORDER — WARFARIN SODIUM 2 MG PO TABS
2.0000 mg | ORAL_TABLET | ORAL | Status: DC
  Administered 2022-03-13 – 2022-03-15 (×2): 2 mg via ORAL
  Filled 2022-03-12 (×2): qty 1

## 2022-03-12 MED ORDER — ACETAMINOPHEN 325 MG PO TABS
650.0000 mg | ORAL_TABLET | ORAL | Status: DC | PRN
  Administered 2022-03-12 – 2022-03-17 (×7): 650 mg via ORAL
  Filled 2022-03-12 (×7): qty 2

## 2022-03-12 MED ORDER — WARFARIN SODIUM 2 MG PO TABS
2.0000 mg | ORAL_TABLET | ORAL | Status: AC
  Administered 2022-03-12: 2 mg via ORAL
  Filled 2022-03-12: qty 1

## 2022-03-12 MED ORDER — EZETIMIBE 10 MG PO TABS
5.0000 mg | ORAL_TABLET | Freq: Every day | ORAL | Status: DC
  Administered 2022-03-13 – 2022-03-18 (×6): 5 mg via ORAL
  Filled 2022-03-12 (×7): qty 1

## 2022-03-12 MED ORDER — SODIUM CHLORIDE 0.9 % IV SOLN: 250.0000 mL | INTRAVENOUS | Status: DC | PRN

## 2022-03-12 MED ORDER — TRAZODONE HCL 50 MG PO TABS
50.0000 mg | ORAL_TABLET | Freq: Every day | ORAL | Status: DC
  Administered 2022-03-12 – 2022-03-17 (×6): 50 mg via ORAL
  Filled 2022-03-12 (×6): qty 1

## 2022-03-12 MED ORDER — MIDODRINE HCL 5 MG PO TABS
10.0000 mg | ORAL_TABLET | Freq: Three times a day (TID) | ORAL | Status: DC
  Administered 2022-03-12: 10 mg via ORAL
  Administered 2022-03-13: 5 mg via ORAL
  Administered 2022-03-13 – 2022-03-18 (×17): 10 mg via ORAL
  Filled 2022-03-12 (×18): qty 2

## 2022-03-12 MED ORDER — POTASSIUM CHLORIDE CRYS ER 20 MEQ PO TBCR
20.0000 meq | EXTENDED_RELEASE_TABLET | Freq: Once | ORAL | Status: AC
  Administered 2022-03-12: 20 meq via ORAL
  Filled 2022-03-12: qty 1

## 2022-03-12 MED ORDER — ATORVASTATIN CALCIUM 80 MG PO TABS
80.0000 mg | ORAL_TABLET | Freq: Every day | ORAL | Status: DC
  Administered 2022-03-13 – 2022-03-18 (×6): 80 mg via ORAL
  Filled 2022-03-12 (×7): qty 1

## 2022-03-12 MED ORDER — PANTOPRAZOLE SODIUM 40 MG PO TBEC
80.0000 mg | DELAYED_RELEASE_TABLET | Freq: Every day | ORAL | Status: DC
  Administered 2022-03-13 – 2022-03-18 (×6): 80 mg via ORAL
  Filled 2022-03-12 (×6): qty 2

## 2022-03-12 MED ORDER — NITROGLYCERIN 0.4 MG SL SUBL: 0.4000 mg | SUBLINGUAL_TABLET | SUBLINGUAL | Status: DC | PRN

## 2022-03-12 MED ORDER — AMIODARONE HCL 100 MG PO TABS
100.0000 mg | ORAL_TABLET | Freq: Every day | ORAL | Status: DC
  Administered 2022-03-13 – 2022-03-18 (×6): 100 mg via ORAL
  Filled 2022-03-12 (×6): qty 1

## 2022-03-12 MED ORDER — SODIUM CHLORIDE 0.9% FLUSH
10.0000 mL | Freq: Two times a day (BID) | INTRAVENOUS | Status: DC
  Administered 2022-03-12: 30 mL
  Administered 2022-03-13 – 2022-03-17 (×10): 10 mL
  Administered 2022-03-18: 20 mL

## 2022-03-12 MED ORDER — UMECLIDINIUM BROMIDE 62.5 MCG/ACT IN AEPB
1.0000 | INHALATION_SPRAY | Freq: Every day | RESPIRATORY_TRACT | Status: DC
  Administered 2022-03-15 – 2022-03-18 (×4): 1 via RESPIRATORY_TRACT
  Filled 2022-03-12: qty 7

## 2022-03-12 MED ORDER — LATANOPROST 0.005 % OP SOLN
1.0000 [drp] | Freq: Every day | OPHTHALMIC | Status: DC
  Administered 2022-03-12 – 2022-03-17 (×6): 1 [drp] via OPHTHALMIC
  Filled 2022-03-12: qty 2.5

## 2022-03-12 MED ORDER — WARFARIN SODIUM 3 MG PO TABS
3.0000 mg | ORAL_TABLET | ORAL | Status: DC
  Administered 2022-03-14: 3 mg via ORAL
  Filled 2022-03-12: qty 1

## 2022-03-12 MED ORDER — CHLORHEXIDINE GLUCONATE CLOTH 2 % EX PADS
6.0000 | MEDICATED_PAD | Freq: Every day | CUTANEOUS | Status: DC
  Administered 2022-03-12 – 2022-03-18 (×7): 6 via TOPICAL

## 2022-03-12 MED ORDER — ASPIRIN 81 MG PO TBEC
81.0000 mg | DELAYED_RELEASE_TABLET | Freq: Every day | ORAL | Status: DC
  Administered 2022-03-13 – 2022-03-18 (×6): 81 mg via ORAL
  Filled 2022-03-12 (×6): qty 1

## 2022-03-12 MED ORDER — ONDANSETRON HCL 4 MG/2ML IJ SOLN: 4.0000 mg | Freq: Four times a day (QID) | INTRAMUSCULAR | Status: DC | PRN

## 2022-03-12 MED ORDER — FUROSEMIDE 10 MG/ML IJ SOLN
80.0000 mg | Freq: Two times a day (BID) | INTRAMUSCULAR | Status: DC
  Administered 2022-03-12 – 2022-03-18 (×12): 80 mg via INTRAVENOUS
  Filled 2022-03-12 (×13): qty 8

## 2022-03-12 MED ORDER — UMECLIDINIUM BROMIDE 62.5 MCG/ACT IN AEPB: 1.0000 | INHALATION_SPRAY | Freq: Every day | RESPIRATORY_TRACT | Status: DC

## 2022-03-12 MED ORDER — ESCITALOPRAM OXALATE 10 MG PO TABS
10.0000 mg | ORAL_TABLET | Freq: Every day | ORAL | Status: DC
  Administered 2022-03-13 – 2022-03-18 (×6): 10 mg via ORAL
  Filled 2022-03-12 (×6): qty 1

## 2022-03-12 MED ORDER — ALBUTEROL SULFATE (2.5 MG/3ML) 0.083% IN NEBU
3.0000 mL | INHALATION_SOLUTION | Freq: Four times a day (QID) | RESPIRATORY_TRACT | Status: DC | PRN
  Administered 2022-03-14 – 2022-03-18 (×4): 3 mL via RESPIRATORY_TRACT
  Filled 2022-03-12 (×4): qty 3

## 2022-03-12 MED ORDER — POTASSIUM CHLORIDE CRYS ER 20 MEQ PO TBCR
40.0000 meq | EXTENDED_RELEASE_TABLET | Freq: Every day | ORAL | Status: DC
  Administered 2022-03-13 – 2022-03-18 (×6): 40 meq via ORAL
  Filled 2022-03-12 (×6): qty 2

## 2022-03-12 MED ORDER — SPIRONOLACTONE 12.5 MG HALF TABLET
12.5000 mg | ORAL_TABLET | Freq: Every day | ORAL | Status: DC
  Administered 2022-03-13: 12.5 mg via ORAL
  Filled 2022-03-12: qty 1

## 2022-03-12 MED ORDER — WARFARIN - PHARMACIST DOSING INPATIENT: Freq: Every day | Status: DC

## 2022-03-12 NOTE — Progress Notes (Signed)
ANTICOAGULATION CONSULT NOTE  Pharmacy Consult for Warfarin Indication: atrial fibrillation  No Known Allergies  Vital Signs: Temp: 97.1 F (36.2 C) (10/04 2008) BP: 92/65 (10/04 2008) Pulse Rate: 58 (10/04 2008)  Labs: Recent Labs    03/11/22 1300 03/12/22 1220 03/12/22 1800  HGB  --  8.5*  --   HCT  --  26.9*  --   PLT  --  238  --   LABPROT  --   --  24.9*  INR 2.2  --  2.3*  CREATININE  --  1.96*  --     Estimated Creatinine Clearance: 37.4 mL/min (A) (by C-G formula based on SCr of 1.96 mg/dL (H)).  Assessment: 76 year old male followed by heart failure clinic admitted for diuresis.  Pharmacy consulted to resume / monitor warfarin for PAF INR therapeutic on admission  Home dose of warfarin 3 mg M and F, 2 mg other days  Goal of Therapy:  INR 2-3 Monitor platelets by anticoagulation protocol: Yes   Plan:  Resume home dose of warfarin Daily INR - next 10/6  Thank you Anette Guarneri, PharmD  03/12/2022,8:25 PM

## 2022-03-12 NOTE — Progress Notes (Signed)
ReDS Vest / Clip - 03/12/22 1100       ReDS Vest / Clip   Station Marker C    Ruler Value 27    ReDS Value Range High volume overload    ReDS Actual Value 47

## 2022-03-12 NOTE — Telephone Encounter (Signed)
Cards has cleared warfarin hold as stated Needs to be scheduled for egd in hospital setting if not done already Thanks JMP

## 2022-03-12 NOTE — Progress Notes (Signed)
Medication Samples have been provided to the patient.  Drug name: furoscix       Strength: 80 mg/10 mL infuser        Qty: 1  LOT: [id number]  Exp.Date: 10/07/2023  Dosing instructions: one infuser as directed   The patient has been instructed regarding the correct time, dose, and frequency of taking this medication, including desired effects and most common side effects.   Keith Jenkins M 12:14 PM 03/12/2022

## 2022-03-12 NOTE — Progress Notes (Signed)
Peripherally Inserted Central Catheter Placement  The IV Nurse has discussed with the patient and/or persons authorized to consent for the patient, the purpose of this procedure and the potential benefits and risks involved with this procedure.  The benefits include less needle sticks, lab draws from the catheter, and the patient may be discharged home with the catheter. Risks include, but not limited to, infection, bleeding, blood clot (thrombus formation), and puncture of an artery; nerve damage and irregular heartbeat and possibility to perform a PICC exchange if needed/ordered by physician.  Alternatives to this procedure were also discussed.  Bard Power PICC patient education guide, fact sheet on infection prevention and patient information card has been provided to patient /or left at bedside.    PICC Placement Documentation  PICC Double Lumen 03/12/22 Right Brachial 40 cm 0 cm (Active)  Indication for Insertion or Continuance of Line Vasoactive infusions;Limited venous access - need for IV therapy >5 days (PICC only) 03/12/22 1811  Exposed Catheter (cm) 0 cm 03/12/22 1811  Site Assessment Clean, Dry, Intact 03/12/22 1811  Lumen #1 Status Flushed;Saline locked;Blood return noted 03/12/22 1811  Lumen #2 Status Flushed;Saline locked;Blood return noted 03/12/22 1811  Dressing Type Transparent;Securing device 03/12/22 1811  Dressing Status Antimicrobial disc in place;Clean, Dry, Intact 03/12/22 1811  Safety Lock Not Applicable 85/88/50 2774  Line Care Connections checked and tightened 03/12/22 1811  Dressing Intervention New dressing;Other (Comment) 03/12/22 1811  Dressing Change Due 03/19/22 03/12/22 1811       Enos Fling 03/12/2022, 6:13 PM

## 2022-03-12 NOTE — Progress Notes (Signed)
  Echocardiogram 2D Echocardiogram has been performed.  Fidel Levy 03/12/2022, 11:15 AM

## 2022-03-12 NOTE — Telephone Encounter (Signed)
Thanks

## 2022-03-12 NOTE — Progress Notes (Signed)
    Pt admitted from office.  See notes Labs reviewed  Na 138, K+ 3.6 and Cr 1.96.   BNP 1,417 lactic acid 1.1  Hgb 8.5 WBC 5.3 Plts 238  INR 2.3  BP 92/65 P 58 R 18 afebrile CXR Pending.

## 2022-03-12 NOTE — Progress Notes (Signed)
Orthopedic Tech Progress Note Patient Details:  Keith Jenkins Feb 20, 1946 [id number]  RN called about UNNA BOOTS being applied, we decided (RN and MYSELF) we'll apply them tomorrow so that way patient is on his correct schedule for UNNA BOOTS since the order said "Monday and Thursday to be changed" if applied today patient would not get them changed again until Monday  Patient ID: Keith Jenkins, male   DOB: February 11, 1946, 76 y.o.   MRN: [id number]  Keith Jenkins 03/12/2022, 6:00 PM
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
## 2022-03-12 NOTE — Patient Instructions (Addendum)
HOLD TORSEMIDE 03/12/2022  Please use one Furoscix infuser   Your provider has order Furoscix for you. This is an on-body infuser that gives you a dose of Furosemide.    Ensure you write down the time you start your infusion so that if there is a problem you will know how long the infusion lasted  Use Furoscix only AS DIRECTED by our office  Dosing Directions:   Day 1 (03/12/2022) - use one infuser with Potassium 40 meq HOLD TORSEMIDE  Day 2= Restart Torsemide 40 mg daily with 40 meq of Potassium    WE HAVE REQUESTED AN INPATIENT BED HERE AT [facility] FROM [phone]  Should your symptoms worsen before bed placement calls please report to   [facility]  Address: [place], [place], [place] [address]   [place] Address: [address] [place], [place] [address]

## 2022-03-12 NOTE — Telephone Encounter (Signed)
Patient is scheduled for EGD at [facility] on 04-08-22.  See TE 02-17-22. Patient is aware to hold Coumadin for 5 days prior to procedure per Cardiology clearance.

## 2022-03-13 ENCOUNTER — Encounter (HOSPITAL_COMMUNITY): Payer: Self-pay | Admitting: Internal Medicine

## 2022-03-13 ENCOUNTER — Inpatient Hospital Stay (HOSPITAL_COMMUNITY): Payer: Medicare HMO

## 2022-03-13 ENCOUNTER — Other Ambulatory Visit: Payer: Self-pay

## 2022-03-13 DIAGNOSIS — I5023 Acute on chronic systolic (congestive) heart failure: Secondary | ICD-10-CM | POA: Diagnosis not present

## 2022-03-13 LAB — COOXEMETRY PANEL
Carboxyhemoglobin: 1.8 % — ABNORMAL HIGH (ref 0.5–1.5)
Carboxyhemoglobin: 2 % — ABNORMAL HIGH (ref 0.5–1.5)
Methemoglobin: <0.7 % (ref 0.0–1.5)
Methemoglobin: <0.7 % (ref 0.0–1.5)
O2 Saturation: 52.7 %
O2 Saturation: 56.4 %
Total hemoglobin: 8.2 g/dL — ABNORMAL LOW (ref 12.0–16.0)
Total hemoglobin: 8.5 g/dL — ABNORMAL LOW (ref 12.0–16.0)

## 2022-03-13 LAB — IRON AND TIBC
Iron: 21 ug/dL — ABNORMAL LOW (ref 45–182)
Saturation Ratios: 5 % — ABNORMAL LOW (ref 17.9–39.5)
TIBC: 456 ug/dL — ABNORMAL HIGH (ref 250–450)
UIBC: 435 ug/dL

## 2022-03-13 LAB — CBC
HCT: 25.2 % — ABNORMAL LOW (ref 39.0–52.0)
Hemoglobin: 7.9 g/dL — ABNORMAL LOW (ref 13.0–17.0)
MCH: 21.4 pg — ABNORMAL LOW (ref 26.0–34.0)
MCHC: 31.3 g/dL (ref 30.0–36.0)
MCV: 68.3 fL — ABNORMAL LOW (ref 80.0–100.0)
Platelets: 219 10*3/uL (ref 150–400)
RBC: 3.69 MIL/uL — ABNORMAL LOW (ref 4.22–5.81)
RDW: 19.2 % — ABNORMAL HIGH (ref 11.5–15.5)
WBC: 4.8 10*3/uL (ref 4.0–10.5)
nRBC: 0 % (ref 0.0–0.2)

## 2022-03-13 LAB — BASIC METABOLIC PANEL
Anion gap: 8 (ref 5–15)
BUN: 32 mg/dL — ABNORMAL HIGH (ref 8–23)
CO2: 28 mmol/L (ref 22–32)
Calcium: 8.3 mg/dL — ABNORMAL LOW (ref 8.9–10.3)
Chloride: 100 mmol/L (ref 98–111)
Creatinine, Ser: 1.84 mg/dL — ABNORMAL HIGH (ref 0.61–1.24)
GFR, Estimated: 38 mL/min — ABNORMAL LOW (ref >60)
Glucose, Bld: 95 mg/dL (ref 70–99)
Potassium: 3.3 mmol/L — ABNORMAL LOW (ref 3.5–5.1)
Sodium: 136 mmol/L (ref 135–145)

## 2022-03-13 LAB — RETICULOCYTES
Immature Retic Fract: 28.3 % — ABNORMAL HIGH (ref 2.3–15.9)
RBC.: 3.79 MIL/uL — ABNORMAL LOW (ref 4.22–5.81)
Retic Count, Absolute: 55.7 10*3/uL (ref 19.0–186.0)
Retic Ct Pct: 1.5 % (ref 0.4–3.1)

## 2022-03-13 LAB — VITAMIN B12: Vitamin B-12: 606 pg/mL (ref 180–914)

## 2022-03-13 LAB — DIGOXIN LEVEL: Digoxin Level: 0.9 ng/mL (ref 0.8–2.0)

## 2022-03-13 LAB — FERRITIN: Ferritin: 27 ng/mL (ref 24–336)

## 2022-03-13 LAB — FOLATE: Folate: 12.6 ng/mL (ref >5.9)

## 2022-03-13 MED ORDER — SODIUM CHLORIDE 0.9 % IV SOLN
510.0000 mg | Freq: Once | INTRAVENOUS | Status: AC
  Administered 2022-03-13: 510 mg via INTRAVENOUS
  Filled 2022-03-13: qty 17

## 2022-03-13 MED ORDER — POTASSIUM CHLORIDE CRYS ER 20 MEQ PO TBCR
40.0000 meq | EXTENDED_RELEASE_TABLET | ORAL | Status: AC
  Administered 2022-03-13 (×2): 40 meq via ORAL
  Filled 2022-03-13 (×2): qty 2

## 2022-03-13 MED ORDER — DIPHENHYDRAMINE HCL 25 MG PO CAPS
25.0000 mg | ORAL_CAPSULE | Freq: Four times a day (QID) | ORAL | Status: DC | PRN
  Filled 2022-03-13: qty 1

## 2022-03-13 MED ORDER — ORAL CARE MOUTH RINSE: 15.0000 mL | OROMUCOSAL | Status: DC | PRN

## 2022-03-13 NOTE — Plan of Care (Signed)

## 2022-03-13 NOTE — Progress Notes (Signed)
Patient complaining of itching around his PICC bandage. When in the room, he does appear quite itchy, even twitching occasionally. This came on sudden. No redness or other skin changes to area. Will page MD for possible Benadryl.

## 2022-03-13 NOTE — Research (Cosign Needed)
[facility] Lung ultrasound validation trial Informed Consent   Subject Name: Keith Jenkins  Subject met inclusion and exclusion criteria.  The informed consent form, study requirements and expectations were reviewed with the subject and questions and concerns were addressed prior to the signing of the consent form.  The subject verbalized understanding of the trail requirements.  The subject agreed to participate in the [facility] trial and signed the informed consent.  The informed consent was obtained prior to performance of any protocol-specific procedures for the subject.  A copy of the signed informed consent was given to the subject and a copy was placed in the subject's medical record.  Both Lung ultrasounds completed.  [name] 03/13/2022, 2:03 PM

## 2022-03-13 NOTE — Plan of Care (Signed)
  Problem: Education: Goal: Knowledge of General Education information will improve Description: Including pain rating scale, medication(s)/side effects and non-pharmacologic comfort measures Outcome: Progressing   Problem: Health Behavior/Discharge Planning: Goal: Ability to manage health-related needs will improve Outcome: Progressing   Problem: Clinical Measurements: Goal: Ability to maintain clinical measurements within normal limits will improve Outcome: Progressing Goal: Diagnostic test results will improve Outcome: Progressing Goal: Respiratory complications will improve Outcome: Progressing Goal: Cardiovascular complication will be avoided Outcome: Progressing   Problem: Activity: Goal: Risk for activity intolerance will decrease Outcome: Progressing   Problem: Skin Integrity: Goal: Risk for impaired skin integrity will decrease Outcome: Progressing

## 2022-03-13 NOTE — Progress Notes (Addendum)
Advanced Heart Failure Rounding Note  PCP-Cardiologist: None   Subjective:   Admitted from HF clinic with A/C HFrEF. Diuresing with IV lasix   10/4- Echo EF 20-25%, RV moderately reduced, moderately dilated atria, mild mitral stenosis, likely mod AS. Started on IV lasix. Given one dose of 80 mg IV. PICC placed.   Negative 1.5 liters.   Hgb 8.5>7.9  CO-OX 52%.   Feels ok. Denies SOB.    Objective:   Weight Range: 89.2 kg Body mass index is 25.94 kg/m.   Vital Signs:   Temp:  [97.1 F (36.2 C)-97.8 F (36.6 C)] 97.8 F (36.6 C) (10/05 0400) Pulse Rate:  [58-60] 58 (10/05 0401) Resp:  [17-19] 17 (10/05 0401) BP: (92-115)/(65-70) 114/69 (10/05 0401) SpO2:  [96 %-98 %] 98 % (10/05 0401) Weight:  [89.2 kg] 89.2 kg (10/05 0500) Last BM Date : 03/12/22  Weight change: Filed Weights   03/13/22 0500  Weight: 89.2 kg    Intake/Output:   Intake/Output Summary (Last 24 hours) at 03/13/2022 0659 Last data filed at 03/13/2022 0400 Gross per 24 hour  Intake --  Output 1525 ml  Net -1525 ml      Physical Exam    General: Sitting on the side of the bed. No resp difficulty HEENT: Normal Neck: Supple. JVP to jaw . Carotids 2+ bilat; no bruits. No lymphadenopathy or thyromegaly appreciated. Cor: PMI nondisplaced. Regular rate & rhythm. No rubs, gallops. 3/6 AS. Lungs: Clear decreased LLL on rrom air.  Abdomen: Soft, nontender, nondistended. No hepatosplenomegaly. No bruits or masses. Good bowel sounds. Extremities: No cyanosis, clubbing, rash, R and LLE 1+ edema. RUE PICC  Neuro: Alert & orientedx3, cranial nerves grossly intact. moves all 4 extremities w/o difficulty. Affect pleasant   Telemetry   SR 50-60s   EKG    N/A  Labs    CBC Recent Labs    03/12/22 1220 03/13/22 0355  WBC 5.3 4.8  HGB 8.5* 7.9*  HCT 26.9* 25.2*  MCV 67.9* 68.3*  PLT 238 219   Basic Metabolic Panel Recent Labs    89/38/10 1220  NA 138  K 3.6  CL 102  CO2 25  GLUCOSE  95  BUN 34*  CREATININE 1.96*  CALCIUM 8.9   Liver Function Tests Recent Labs    03/12/22 1220  AST 35  ALT 30  ALKPHOS 90  BILITOT 1.1  PROT 6.7  ALBUMIN 3.2*   No results for input(s): "LIPASE", "AMYLASE" in the last 72 hours. Cardiac Enzymes No results for input(s): "CKTOTAL", "CKMB", "CKMBINDEX", "TROPONINI" in the last 72 hours.  BNP: BNP (last 3 results) Recent Labs    01/28/22 0926 02/14/22 1205 03/12/22 1220  BNP 1,033.4* 1,321.3* 1,417.5*    ProBNP (last 3 results) No results for input(s): "PROBNP" in the last 8760 hours.   D-Dimer No results for input(s): "DDIMER" in the last 72 hours. Hemoglobin A1C No results for input(s): "HGBA1C" in the last 72 hours. Fasting Lipid Panel No results for input(s): "CHOL", "HDL", "LDLCALC", "TRIG", "CHOLHDL", "LDLDIRECT" in the last 72 hours. Thyroid Function Tests Recent Labs    03/12/22 1214  TSH 0.691    Other results:   Imaging    [place] EKG SITE RITE  Result Date: 03/12/2022 If Site Rite image not attached, placement could not be confirmed due to current cardiac rhythm.  ECHOCARDIOGRAM COMPLETE  Result Date: 03/12/2022    ECHOCARDIOGRAM REPORT   Patient Name:   Keith Jenkins Date of Exam: 03/12/2022 Medical  Rec #:  [id number]    Height:       73.0 in Accession #:    [id number]   Weight:       199.1 lb Date of Birth:  [birth year]    BSA:          2.147 m Patient Age:    76 years     BP:           102/62 mmHg Patient Gender: M            HR:           60 bpm. Exam Location:  Outpatient Procedure: 2D Echo, Cardiac Doppler, Color Doppler and 3D Echo Indications:     Congestive Heart Failure I50.9  History:         Patient has prior history of Echocardiogram examinations, most                  recent 11/06/2021. CHF; Previous Myocardial Infarction.                   Mitral Valve: MITRAL MITRIS RESILIA 27 valve is present in the                  mitral position. Procedure Date: 11/01/21.  Sonographer:     Eulah Pont RDCS  Referring Phys:  [id number] JESSICA M MILFORD Diagnosing Phys: Arvilla Meres MD IMPRESSIONS  1. Left ventricular ejection fraction, by estimation, is 20 to 25%. The left ventricle has severely decreased function. The left ventricle demonstrates global hypokinesis. The left ventricular internal cavity size was mildly to moderately dilated. Left ventricular diastolic parameters are indeterminate.  2. Right ventricular systolic function is moderately reduced. The right ventricular size is mildly enlarged. There is normal pulmonary artery systolic pressure.  3. Left atrial size was moderately dilated.  4. Right atrial size was moderately dilated.  5. Large pleural effusion in the left lateral region.  6. The mitral valve has been repaired/replaced. No evidence of mitral valve regurgitation. Mild mitral stenosis. The mean mitral valve gradient is 4.0 mmHg. There is a MITRAL MITRIS RESILIA 27 present in the mitral position. Procedure Date: 11/01/21.  7. Suspect component of low gradient AS. Likely moderate AS. Dimensionless index = 0.31. The aortic valve is tricuspid. There is moderate calcification of the aortic valve. Aortic valve regurgitation is mild. Moderate aortic valve stenosis. Aortic regurgitation PHT measures 497 msec. Aortic valve area, by VTI measures 1.27 cm. Aortic valve mean gradient measures 13.2 mmHg. Aortic valve Vmax measures 2.52 m/s.  8. Aortic dilatation noted. There is mild dilatation of the ascending aorta, measuring 41 mm.  9. The inferior vena cava is dilated in size with >50% respiratory variability, suggesting right atrial pressure of 8 mmHg. FINDINGS  Left Ventricle: Left ventricular ejection fraction, by estimation, is 20 to 25%. The left ventricle has severely decreased function. The left ventricle demonstrates global hypokinesis. The left ventricular internal cavity size was mildly to moderately dilated. There is no left ventricular hypertrophy. Left ventricular diastolic parameters are  indeterminate. Right Ventricle: The right ventricular size is mildly enlarged. No increase in right ventricular wall thickness. Right ventricular systolic function is moderately reduced. There is normal pulmonary artery systolic pressure. The tricuspid regurgitant velocity is 1.78 m/s, and with an assumed right atrial pressure of 15 mmHg, the estimated right ventricular systolic pressure is 27.7 mmHg. Left Atrium: Left atrial size was moderately dilated. Right Atrium: Right atrial size was moderately dilated. Pericardium: There is  no evidence of pericardial effusion. Mitral Valve: The mitral valve has been repaired/replaced. No evidence of mitral valve regurgitation. There is a MITRAL MITRIS RESILIA 27 present in the mitral position. Procedure Date: 11/01/21. Mild mitral valve stenosis. MV peak gradient, 18.3 mmHg. The mean mitral valve gradient is 4.0 mmHg. Tricuspid Valve: The tricuspid valve is normal in structure. Tricuspid valve regurgitation is mild . No evidence of tricuspid stenosis. Aortic Valve: Suspect component of low gradient AS. Likely moderate AS. Dimensionless index = 0.31. The aortic valve is tricuspid. There is moderate calcification of the aortic valve. Aortic valve regurgitation is mild. Aortic regurgitation PHT measures 497 msec. Moderate aortic stenosis is present. Aortic valve mean gradient measures 13.2 mmHg. Aortic valve peak gradient measures 25.5 mmHg. Aortic valve area, by VTI measures 1.27 cm. Pulmonic Valve: The pulmonic valve was normal in structure. Pulmonic valve regurgitation is mild. No evidence of pulmonic stenosis. Aorta: Aortic dilatation noted. There is mild dilatation of the ascending aorta, measuring 41 mm. Venous: The inferior vena cava is dilated in size with greater than 50% respiratory variability, suggesting right atrial pressure of 8 mmHg. IAS/Shunts: No atrial level shunt detected by color flow Doppler. Additional Comments: There is a large pleural effusion in the left  lateral region.  LEFT VENTRICLE PLAX 2D LVIDd:         6.20 cm LVIDs:         4.90 cm LV PW:         0.90 cm LV IVS:        1.10 cm LVOT diam:     2.30 cm      3D Volume EF: LV SV:         67           3D EF:        29 % LV SV Index:   31           LV EDV:       297 ml LVOT Area:     4.15 cm     LV ESV:       211 ml                             LV SV:        86 ml  LV Volumes (MOD) LV vol d, MOD A2C: 226.0 ml LV vol d, MOD A4C: 206.0 ml LV vol s, MOD A2C: 159.0 ml LV vol s, MOD A4C: 136.0 ml LV SV MOD A2C:     67.0 ml LV SV MOD A4C:     206.0 ml LV SV MOD BP:      67.7 ml RIGHT VENTRICLE RV S prime:     5.63 cm/s TAPSE (M-mode): 0.8 cm LEFT ATRIUM              Index        RIGHT ATRIUM           Index LA diam:        4.40 cm  2.05 cm/m   RA Area:     23.80 cm LA Vol (A2C):   97.1 ml  45.22 ml/m  RA Volume:   81.10 ml  37.77 ml/m LA Vol (A4C):   95.9 ml  44.66 ml/m LA Biplane Vol: 102.0 ml 47.50 ml/m  AORTIC VALVE  PULMONIC VALVE AV Area (Vmax):    1.32 cm      PR End Diast Vel: 6.66 msec AV Area (Vmean):   1.27 cm AV Area (VTI):     1.27 cm AV Vmax:           252.33 cm/s AV Vmean:          170.600 cm/s AV VTI:            0.528 m AV Peak Grad:      25.5 mmHg AV Mean Grad:      13.2 mmHg LVOT Vmax:         80.10 cm/s LVOT Vmean:        52.200 cm/s LVOT VTI:          0.161 m LVOT/AV VTI ratio: 0.31 AI PHT:            497 msec AR Vena Contracta: 0.30 cm  AORTA Ao Root diam: 4.40 cm Ao Asc diam:  4.10 cm MITRAL VALVE                TRICUSPID VALVE MV Area (PHT): 2.45 cm     TR Peak grad:   12.7 mmHg MV Area VTI:   0.95 cm     TR Vmax:        178.00 cm/s MV Peak grad:  18.3 mmHg MV Mean grad:  4.0 mmHg     SHUNTS MV Vmax:       2.14 m/s     Systemic VTI:  0.16 m MV Vmean:      81.5 cm/s    Systemic Diam: 2.30 cm MV Decel Time: 310 msec MV E velocity: 186.00 cm/s MV A velocity: 42.20 cm/s MV E/A ratio:  4.41 Arvilla Meres MD Electronically signed by Arvilla Meres MD Signature Date/Time:  03/12/2022/11:43:26 AM    Final (Updated)      Medications:     Scheduled Medications:  amiodarone  100 mg Oral Daily   aspirin EC  81 mg Oral Daily   atorvastatin  80 mg Oral Daily   Chlorhexidine Gluconate Cloth  6 each Topical Daily   dapagliflozin propanediol  10 mg Oral Daily   digoxin  0.0625 mg Oral Daily   escitalopram  10 mg Oral Daily   ezetimibe  5 mg Oral Daily   furosemide  80 mg Intravenous Q12H   latanoprost  1 drop Both Eyes QHS   midodrine  10 mg Oral TID   pantoprazole  80 mg Oral Q1200   potassium chloride SA  40 mEq Oral Daily   sodium chloride flush  10-40 mL Intracatheter Q12H   sodium chloride flush  3 mL Intravenous Q12H   spironolactone  12.5 mg Oral Daily   traZODone  50 mg Oral QHS   umeclidinium bromide  1 puff Inhalation Daily   warfarin  2 mg Oral Once per day on Sun Tue Wed Thu Sat   And   [START ON 03/14/2022] warfarin  3 mg Oral Once per day on Fri   Warfarin - Pharmacist Dosing Inpatient   Does not apply q1600    Infusions:  sodium chloride      PRN Medications: sodium chloride, acetaminophen, albuterol, nitroGLYCERIN, ondansetron (ZOFRAN) IV, mouth rinse, sodium chloride flush, sodium chloride flush    Patient Profile  Keith Jenkins is a 76 y.o. male with COPD, GERD/dysphagia, CAD, mitral regurgitation s/p CABG/MVR 5/23, and HFrEF.   Admitted with A/C HFrEF. Marland Kitchen   Assessment/Plan  1. Acute on Chronic systolic CHF /ICM - s/p late presentation inferior STEMI 05/23 - Echo (5/23): EF 25-30%, RV moderately reduced, moderate to severe MR. - S/p CABG + bioprosthetic MVR 11/01/21 - Echo today 03/12/22 showed EF 20-25%, severe LV dysfunction with global HK, + large Left pleural effusion, RV moderately down, moderate aortic stenosis, suspect component of low gradient AS. -On admit NYHA IV, volume remains up despite escalation in diuretics. REDs on admit 47%. Concern for low output. May need VAD evaluation  - Remains volume overloaded. Has had  80 mg IV lasix x1. Continue IV lasix. 80 mg twice a day. Set up CVP.  - CO-OX 61>53%. Hgb 7.9 which may contribute. Will repeat. If < 55% will add milrinone 0.125 mcg.   - Continue digoxin 0.0625 mg daily, dig level pending.  - Continue Farxiga 10 mg daily.  - Continue spiro 12.5 mg daily.  - Continue midodrine 10 mg tid. - No beta blocker with decompensation/volume overload. - BMET pending.     2. CAD/recent late presentation inferior STEMI - LHC 05/23: 100% m RCA and 90% m LAD.  - RCA managed medically d/t completed infarct.  - Echo (5/23) EF 25%, RV moderately reduced, moderate to severe MR - s/p CABG x 2 (LIMA-LAD, SVG-Ramus INT)  (11/01/21) - No chest pain.  - Continue ASA + high intensity statin.    3. Mitral regurgitation - Moderate to severe ischemic MR on TEE.  - S/p bioprosthetic MVR 11/02/21. - On warfarin per TCTS (3 months post valve). INR followed by [facility].  - INR pending.    4. Aortic stenosis - Moderate on echo today.   5. H/o Pericardial effusion/Acute Pericarditis  - Moderate in size on past echo, w/o tamponade. - Acute pericarditis/ adhensions noted at time of CABG. - Resolved on echo.   6. PAF - s/p MAZE w/ CABG  - Maintaining SR.  - On warfarin. Followed by coumadin clinic.  - INR pending.  - Continue amiodarone 100 mg daily.    7. Left pleural effusion - Large on echo this admit.  - Diurese as above. -  CXR pending.    8. Esophageal stricture - Also has h/o esophageal stricture (was unable to pass TEE probe transgastric prior to CABG) - Barium swallow with distal stricture.  - Dr. Rhea Belton planning EGD with dilation. Need to hold off for right now with decompensation.  9. Anemia, iron-deficiency Hgb 8.5>7.9. No obvious source. Check anemia panel.    Set up CVP. Repeat CO-OX. Continue to diurese.   Length of Stay: 1  Amy Clegg, NP  03/13/2022, 6:59 AM  Advanced Heart Failure Team Pager [phone] (M-F; 7a - 5p)  Please  contact [facility] for night-coverage after hours (5p -7a ) and weekends on [email or web address]  Patient seen and examined with the above-signed Advanced Practice Provider and/or Housestaff. I personally reviewed laboratory data, imaging studies and relevant notes. I independently examined the patient and formulated the important aspects of the plan. I have edited the note to reflect any of my changes or salient points. I have personally discussed the plan with the patient and/or family.  Only modest diuresis with IV lasix. Co-ox down to 52%. Feels weak.   General:  Weak appearing. No resp difficulty HEENT: normal Neck: supple. JVP to jaw Carotids 2+ bilat; no bruits. No lymphadenopathy or thryomegaly appreciated. Cor: PMI nondisplaced. Regular rate & rhythm. 2/6 AS. Lungs: clear decreased at bases Abdomen: soft, nontender, nondistended. No hepatosplenomegaly. No bruits  or masses. Good bowel sounds. Extremities: no cyanosis, clubbing, rash, 2+ edema Neuro: alert & orientedx3, cranial nerves grossly intact. moves all 4 extremities w/o difficulty. Affect pleasant  He remains volume overloaded. Now low output. Start milrinone. (Watch for recurrent AF)  Wrap legs. Continue IV diuresis. Will need IV iron. PT/OT to see. Will need to consider VAD if he is candidate.   Glori Bickers, MD  8:21 AM

## 2022-03-13 NOTE — TOC Initial Note (Addendum)
Transition of Care [facility]) - Initial/Assessment Note    Patient Details  Name: Keith Jenkins MRN: [id number] Date of Birth: Jun 13, 1945  Transition of Care [facility] CM/SW Contact:    Elliot Cousin, RN Phone Number: [phone] 03/13/2022, 2:14 PM  Clinical Narrative:                 HF TOC CM spoke to pt and wife at bedside. Wife at home to assist with care. States he had [facility] in the past for [facility]. Has scale and blood pressure cuff at home. States he does weigh daily and tries to adhere to heart healthy low sodium diet. Pt reported falls at home. Will wait PT/OT recommendations.   Expected Discharge Plan: Home/Self Care Barriers to Discharge: Continued Medical Work up   Patient Goals and CMS Choice Patient states their goals for this hospitalization and ongoing recovery are:: wants to remain independent CMS Medicare.gov Compare Post Acute Care list provided to:: Patient    Expected Discharge Plan and Services Expected Discharge Plan: Home/Self Care   Discharge Planning Services: CM Consult Post Acute Care Choice: Home Health Living arrangements for the past 2 months: Single Family Home                                      Prior Living Arrangements/Services Living arrangements for the past 2 months: Single Family Home Lives with:: Spouse Patient language and need for interpreter reviewed:: Yes        Need for Family Participation in Patient Care: No (Comment) Care giver support system in place?: Yes (comment) Current home services: DME ([employer], 3n1 bedside commode) Criminal Activity/Legal Involvement Pertinent to Current Situation/Hospitalization: No - Comment as needed  Activities of Daily Living      Permission Sought/Granted Permission sought to share information with : Case Manager, Family Supports Permission granted to share information with : Yes, Verbal Permission Granted  Share Information with NAME: Soren Lazarz     Permission granted to share  info w Relationship: wife  Permission granted to share info w Contact Information: [phone]  Emotional Assessment Appearance:: Appears stated age Attitude/Demeanor/Rapport: Engaged Affect (typically observed): Accepting, Pleasant Orientation: : Oriented to Self, Oriented to Place, Oriented to  Time, Oriented to Situation   Psych Involvement: No (comment)  Admission diagnosis:  Acute CHF (congestive heart failure) (HCC) [I50.9] Patient Active Problem List   Diagnosis Date Noted   Acute CHF (congestive heart failure) (HCC) 03/12/2022   Heat syncope 02/24/2022   Contusion of scalp 02/24/2022   Scalp laceration 02/24/2022   Fall on same level 02/24/2022   Need for influenza vaccination 02/24/2022   Microcytic anemia 02/24/2022   Stage 3b chronic kidney disease (CKD) (HCC) 02/24/2022   Long term (current) use of anticoagulants 11/26/2021   Mitral valve replaced 11/26/2021   Esophageal dysphagia    Mitral regurgitation 11/01/2021   S/P mitral valve replacement 11/01/2021   Acute on chronic combined systolic (congestive) and diastolic (congestive) heart failure (HCC) 10/24/2021   Pericardial effusion    Severe mitral regurgitation    NSTEMI (non-ST elevated myocardial infarction) (HCC) 10/10/2021   Non-ST elevation (NSTEMI) myocardial infarction (HCC) 10/10/2021   Acute systolic heart failure (HCC) 10/10/2021   Hyperlipidemia 10/10/2021   PCP:  [name], NP Pharmacy:   [facility] [place] - [place], [place] - [address] [place] [address] Phone:  [phone] Fax: [phone]     Social Determinants of Health (SDOH) Interventions    Readmission Risk Interventions     No data to display

## 2022-03-13 NOTE — Consult Note (Signed)
[place] Nurse Consult Note: Patient receiving care in [facility] 2C12. Reason for Consult: abrasion on back of right heel Wound type: dry, stable, golden brown crust (scab) on RLE just above the heel Pressure Injury POA: NA Measurement: na Wound bed: as above Drainage (amount, consistency, odor) none; no induration, no erythema Periwound:intact Dressing procedure/placement/frequency: Normal routine cleansing with soap and water and avoiding picking at the scab is all that is needed. [place] nurse will not follow at this time.  Please re-consult the [place] team if needed.  Val Riles, RN, MSN, CWOCN, CNS-BC, pager [phone]

## 2022-03-13 NOTE — Progress Notes (Signed)
Orthopedic Tech Progress Note Patient Details:  Keith Jenkins [birth year] [id number]  Bilateral unna boots applied. Pt is on schedule to be changed 03/17/22.    Ortho Devices Type of Ortho Device: Haematologist Ortho Device/Splint Location: BLE Ortho Device/Splint Interventions: Ordered, Application, Adjustment   Post Interventions Patient Tolerated: Well Instructions Provided: Care of device  [name] Jeri Modena 03/13/2022, 2:28 PM

## 2022-03-13 NOTE — Progress Notes (Signed)
ANTICOAGULATION CONSULT NOTE  Pharmacy Consult for Warfarin Indication: atrial fibrillation  No Known Allergies  Vital Signs: Temp: 97.6 F (36.4 C) (10/05 0915) Temp Source: Oral (10/05 0915) BP: 119/74 (10/05 0915) Pulse Rate: 60 (10/05 0915)  Labs: Recent Labs    03/11/22 1300 03/12/22 1220 03/12/22 1800 03/13/22 0355  HGB  --  8.5*  --  7.9*  HCT  --  26.9*  --  25.2*  PLT  --  238  --  219  LABPROT  --   --  24.9*  --   INR 2.2  --  2.3*  --   CREATININE  --  1.96*  --  1.84*     Estimated Creatinine Clearance: 39.8 mL/min (A) (by C-G formula based on SCr of 1.84 mg/dL (H)).  Assessment: 25 yoM on warfarin PTA for hx AF and recent bMVR. INR overnight was therapeutic, CBC stable.  **Home dose of warfarin 3 mg M and F, 2 mg other days  Goal of Therapy:  INR 2-3 Monitor platelets by anticoagulation protocol: Yes   Plan:  Warfarin 2mg  PO x1 tonight Daily protime  Arrie Senate, PharmD, BCPS, [facility] Clinical Pharmacist [phone] Please check AMION for all [place] numbers 03/13/2022

## 2022-03-13 NOTE — Progress Notes (Signed)
Heart Failure Navigator Progress Note  Assessed for Heart & Vascular TOC clinic readiness.  Patient does not meet criteria due to established patient with Advanced Heart failure Team, Dr. Haroldine Laws.    Earnestine Leys, BSN, Clinical cytogeneticist Only

## 2022-03-14 LAB — BASIC METABOLIC PANEL
Anion gap: 8 (ref 5–15)
BUN: 29 mg/dL — ABNORMAL HIGH (ref 8–23)
CO2: 26 mmol/L (ref 22–32)
Calcium: 8.7 mg/dL — ABNORMAL LOW (ref 8.9–10.3)
Chloride: 103 mmol/L (ref 98–111)
Creatinine, Ser: 1.85 mg/dL — ABNORMAL HIGH (ref 0.61–1.24)
GFR, Estimated: 38 mL/min — ABNORMAL LOW (ref >60)
Glucose, Bld: 92 mg/dL (ref 70–99)
Potassium: 3.5 mmol/L (ref 3.5–5.1)
Sodium: 137 mmol/L (ref 135–145)

## 2022-03-14 LAB — COOXEMETRY PANEL
Carboxyhemoglobin: 0.9 % (ref 0.5–1.5)
Carboxyhemoglobin: 1.6 % — ABNORMAL HIGH (ref 0.5–1.5)
Methemoglobin: 1.5 % (ref 0.0–1.5)
Methemoglobin: <0.7 % (ref 0.0–1.5)
O2 Saturation: 45 %
O2 Saturation: 57.1 %
Total hemoglobin: 8.2 g/dL — ABNORMAL LOW (ref 12.0–16.0)
Total hemoglobin: 8.3 g/dL — ABNORMAL LOW (ref 12.0–16.0)

## 2022-03-14 LAB — PROTIME-INR
INR: 2.4 — ABNORMAL HIGH (ref 0.8–1.2)
Prothrombin Time: 25.5 seconds — ABNORMAL HIGH (ref 11.4–15.2)

## 2022-03-14 LAB — CBC
HCT: 24.7 % — ABNORMAL LOW (ref 39.0–52.0)
Hemoglobin: 7.7 g/dL — ABNORMAL LOW (ref 13.0–17.0)
MCH: 21.1 pg — ABNORMAL LOW (ref 26.0–34.0)
MCHC: 31.2 g/dL (ref 30.0–36.0)
MCV: 67.7 fL — ABNORMAL LOW (ref 80.0–100.0)
Platelets: 200 10*3/uL (ref 150–400)
RBC: 3.65 MIL/uL — ABNORMAL LOW (ref 4.22–5.81)
RDW: 19.2 % — ABNORMAL HIGH (ref 11.5–15.5)
WBC: 4.6 10*3/uL (ref 4.0–10.5)
nRBC: 0 % (ref 0.0–0.2)

## 2022-03-14 MED ORDER — SPIRONOLACTONE 25 MG PO TABS
25.0000 mg | ORAL_TABLET | Freq: Every day | ORAL | Status: DC
  Administered 2022-03-14 – 2022-03-18 (×5): 25 mg via ORAL
  Filled 2022-03-14 (×5): qty 1

## 2022-03-14 MED ORDER — POTASSIUM CHLORIDE CRYS ER 20 MEQ PO TBCR
40.0000 meq | EXTENDED_RELEASE_TABLET | ORAL | Status: AC
  Administered 2022-03-14 (×2): 40 meq via ORAL
  Filled 2022-03-14 (×2): qty 2

## 2022-03-14 NOTE — Progress Notes (Signed)
PT Cancellation Note  Patient Details Name: Keith Jenkins MRN: [id number] DOB: [birth year]   Cancelled Treatment:    Reason Eval/Treat Not Completed: Active bedrest order   Sandy Salaam Char Feltman 03/14/2022, 7:23 AM [place] Office: [phone]

## 2022-03-14 NOTE — Progress Notes (Signed)
ANTICOAGULATION CONSULT NOTE  Pharmacy Consult for Warfarin Indication: atrial fibrillation  No Known Allergies  Vital Signs: Temp: 97.8 F (36.6 C) (10/06 0310) Temp Source: Oral (10/06 0310) BP: 101/70 (10/06 0500) Pulse Rate: 60 (10/06 0500)  Labs: Recent Labs    03/11/22 1300 03/12/22 1220 03/12/22 1220 03/12/22 1800 03/13/22 0355 03/14/22 0505  HGB  --  8.5*   < >  --  7.9* 7.7*  HCT  --  26.9*  --   --  25.2* 24.7*  PLT  --  238  --   --  219 200  LABPROT  --   --   --  24.9*  --  25.5*  INR 2.2  --   --  2.3*  --  2.4*  CREATININE  --  1.96*  --   --  1.84* 1.85*   < > = values in this interval not displayed.     Estimated Creatinine Clearance: 39.6 mL/min (A) (by C-G formula based on SCr of 1.85 mg/dL (H)).  Assessment: 55 yoM on warfarin PTA for hx AF and recent bMVR. INR remains therapeutic on home dose, CBC stable.  **Home dose of warfarin 3 mg M and F, 2 mg other days  Goal of Therapy:  INR 2-3 Monitor platelets by anticoagulation protocol: Yes   Plan:  Warfarin 3mg  PO x1 tonight Daily protime   Arrie Senate, PharmD, BCPS, [facility] Clinical Pharmacist [phone] Please check AMION for all [place] numbers 03/14/2022

## 2022-03-14 NOTE — Consult Note (Signed)
   [facility] CM Inpatient Consult   03/14/2022  [name] 1946-01-04 [id number]  [place] [facility] [ACO] Patient: Keith Jenkins Medicare   Primary Care Provider:  Rip Harbour, NP, with [facility]  Referral:  Inpatient [facility] HF [facility]  Patient screened for hospitalization with noted high risk score for unplanned readmission risk and  to assess for potential [place] Management service needs for post hospital transition.  Review of patient's medical record reveals patient is from home with wife.  Met with the patient at the bedside regarding post hospital needs.  He monitors his BP 3 times a day, he states he has a digital scale and weighs daily.  He states, "I thought I was doing everything right but I have fallen 4 times for not probably using my cane."  Plan:  Continue to follow progress and disposition to assess for post hospital care management needs.  Will assign patient for [facility] RNCM for follow up for care coordination needs.  For questions contact:   Natividad Brood, RN BSN [facility] Liaison  [phone] business mobile phone Toll free office [phone]  Fax number: [phone] [email or web address]

## 2022-03-14 NOTE — Plan of Care (Signed)
Discussed with patient plan of care for the evening, pain management and bedtime medications with some teach back displayed.  Also, talked about safety with him falling at home going to the bathroom over three times why his bed alarm was turned on tonight.  Patient is forgetful and often repeats and jokes when he doesn't understand something.  Problem: Education: Goal: Knowledge of General Education information will improve Description: Including pain rating scale, medication(s)/side effects and non-pharmacologic comfort measures Outcome: Progressing   Problem: Health Behavior/Discharge Planning: Goal: Ability to manage health-related needs will improve Outcome: Not Progressing

## 2022-03-14 NOTE — Care Management Important Message (Signed)
Important Message  Patient Details  Name: Keith Jenkins MRN: [id number] Date of Birth: 1945/10/10   Medicare Important Message Given:  Yes     Orbie Pyo 03/14/2022, 2:46 PM

## 2022-03-14 NOTE — Progress Notes (Signed)
Advanced Heart Failure Rounding Note  PCP-Cardiologist: None   Subjective:   10/4 Admitted from HF clinic with A/C HFrEF w/ volume overload. Echo EF 20-25%, RV mod reduced. PICC placed, Co-oxes marginal 56-61%  Today, co-ox down to 45%. STAT repeat pending.   Had at least 2.4 L in UOP + 2 unmeasured voids yesterday. Wt down 2 lb. CVP 15. SCr stable, 1.96>>1.84>>1.85  K 3.5   Sitting up on side of bed eating breakfast. Denies any current resting dyspnea. No CP.    Objective:   Weight Range: 88.3 kg Body mass index is 25.68 kg/m.   Vital Signs:   Temp:  [97.5 F (36.4 C)-97.8 F (36.6 C)] 97.8 F (36.6 C) (10/06 0310) Pulse Rate:  [58-64] 60 (10/06 0500) Resp:  [14-20] 14 (10/06 0500) BP: (91-119)/(60-78) 101/70 (10/06 0500) SpO2:  [91 %-98 %] 91 % (10/06 0500) Weight:  [88.3 kg] 88.3 kg (10/06 0310) Last BM Date : 03/14/22  Weight change: Filed Weights   03/13/22 0500 03/14/22 0310  Weight: 89.2 kg 88.3 kg    Intake/Output:   Intake/Output Summary (Last 24 hours) at 03/14/2022 0730 Last data filed at 03/14/2022 0311 Gross per 24 hour  Intake 853 ml  Output 2050 ml  Net -1197 ml      Physical Exam    CVP 15  General:  Well appearing. No respiratory difficulty HEENT: normal Neck: supple. JVD 14-15 cm. Carotids 2+ bilat; no bruits. No lymphadenopathy or thyromegaly appreciated. Cor: PMI nondisplaced. Regular rate & rhythm. 2/6 AS murmur Lungs: decreased BS at the bases bilaterally  Abdomen: soft, nontender, nondistended. No hepatosplenomegaly. No bruits or masses. Good bowel sounds. Extremities: no cyanosis, clubbing, rash, trace - 1+ b/l LEE  + unna boots + RUE PIC Neuro: alert & oriented x 3, cranial nerves grossly intact. moves all 4 extremities w/o difficulty. Affect pleasant.    Telemetry   NSR 60s   EKG    N/A  Labs    CBC Recent Labs    03/13/22 0355 03/14/22 0505  WBC 4.8 4.6  HGB 7.9* 7.7*  HCT 25.2* 24.7*  MCV 68.3* 67.7*   PLT 219 200   Basic Metabolic Panel Recent Labs    70/26/37 0355 03/14/22 0505  NA 136 137  K 3.3* 3.5  CL 100 103  CO2 28 26  GLUCOSE 95 92  BUN 32* 29*  CREATININE 1.84* 1.85*  CALCIUM 8.3* 8.7*   Liver Function Tests Recent Labs    03/12/22 1220  AST 35  ALT 30  ALKPHOS 90  BILITOT 1.1  PROT 6.7  ALBUMIN 3.2*   No results for input(s): "LIPASE", "AMYLASE" in the last 72 hours. Cardiac Enzymes No results for input(s): "CKTOTAL", "CKMB", "CKMBINDEX", "TROPONINI" in the last 72 hours.  BNP: BNP (last 3 results) Recent Labs    01/28/22 0926 02/14/22 1205 03/12/22 1220  BNP 1,033.4* 1,321.3* 1,417.5*    ProBNP (last 3 results) No results for input(s): "PROBNP" in the last 8760 hours.   D-Dimer No results for input(s): "DDIMER" in the last 72 hours. Hemoglobin A1C No results for input(s): "HGBA1C" in the last 72 hours. Fasting Lipid Panel No results for input(s): "CHOL", "HDL", "LDLCALC", "TRIG", "CHOLHDL", "LDLDIRECT" in the last 72 hours. Thyroid Function Tests Recent Labs    03/12/22 1214  TSH 0.691    Other results:   Imaging    No results found.   Medications:     Scheduled Medications:  amiodarone  100 mg  Oral Daily   aspirin EC  81 mg Oral Daily   atorvastatin  80 mg Oral Daily   Chlorhexidine Gluconate Cloth  6 each Topical Daily   dapagliflozin propanediol  10 mg Oral Daily   digoxin  0.0625 mg Oral Daily   escitalopram  10 mg Oral Daily   ezetimibe  5 mg Oral Daily   furosemide  80 mg Intravenous Q12H   latanoprost  1 drop Both Eyes QHS   midodrine  10 mg Oral TID   pantoprazole  80 mg Oral Q1200   potassium chloride SA  40 mEq Oral Daily   sodium chloride flush  10-40 mL Intracatheter Q12H   sodium chloride flush  3 mL Intravenous Q12H   spironolactone  12.5 mg Oral Daily   traZODone  50 mg Oral QHS   umeclidinium bromide  1 puff Inhalation Daily   warfarin  2 mg Oral Once per day on Sun Tue Wed Thu Sat   And    warfarin  3 mg Oral Once per day on Fri   Warfarin - Pharmacist Dosing Inpatient   Does not apply q1600    Infusions:  sodium chloride      PRN Medications: sodium chloride, acetaminophen, albuterol, diphenhydrAMINE, nitroGLYCERIN, ondansetron (ZOFRAN) IV, mouth rinse, sodium chloride flush, sodium chloride flush    Patient Profile  Keith Jenkins is a 76 y.o. male with COPD, GERD/dysphagia, CAD, mitral regurgitation s/p CABG/MVR 5/23, and HFrEF.   Admitted with A/C HFrEF.  Assessment/Plan   1. Acute on Chronic systolic CHF /ICM - s/p late presentation inferior STEMI 05/23 - Echo (5/23): EF 25-30%, RV moderately reduced, moderate to severe MR. - S/p CABG + bioprosthetic MVR 11/01/21 - Echo 03/12/22 showed EF 20-25%, severe LV dysfunction with global HK, + large Left pleural effusion, RV moderately down, moderate aortic stenosis, suspect component of low gradient AS. - On admit NYHA IV - Diuresing w/ IV Lasix, CVP 15 today. SCr stable. Co-ox 45%. Stat repeat pending, If < 55% will add milrinone 0.125 mcg.   - Continue IV Lasix 80 mg bid  - On digoxin 0.0625 mg daily, (dig level 0.9) monitor closely   - Continue Farxiga 10 mg daily.  - Increase Spiro to 25 mg daily. Watch Scr  - Continue midodrine 10 mg tid. - No beta blocker with decompensation/volume overload. - Will need to consider VAD if he is candidate    2. CAD/recent late presentation inferior STEMI - LHC 05/23: 100% m RCA and 90% m LAD.  - RCA managed medically d/t completed infarct.  - Echo (5/23) EF 25%, RV moderately reduced, moderate to severe MR - s/p CABG x 2 (LIMA-LAD, SVG-Ramus INT)  (11/01/21) - No chest pain.  - Continue ASA + high intensity statin.    3. Mitral regurgitation - Moderate to severe ischemic MR on TEE.  - S/p bioprosthetic MVR 11/02/21. - On warfarin per TCTS (3 months post valve). INR followed by [facility].  - INR 2.4    4. Aortic stenosis - Moderate on echo this admit.    5. H/o Pericardial effusion/Acute Pericarditis  - Moderate in size on past echo, w/o tamponade. - Acute pericarditis/ adhensions noted at time of CABG. - Resolved on echo.   6. PAF - s/p MAZE w/ CABG  - Maintaining SR.  - On warfarin. Followed by coumadin clinic.  - INR 2.4 - Continue amiodarone 100 mg daily.    7. Left pleural effusion - Large on echo this  admit.  - reported as small on CXR  - Diurese as above.   8. Esophageal stricture - Also has h/o esophageal stricture (was unable to pass TEE probe transgastric prior to CABG) - Barium swallow with distal stricture.  - Dr. Rhea Belton planning EGD with dilation. Need to hold off for right now with decompensation.  9. Anemia, iron-deficiency - Hgb 8.5>7.9>>7.7. No obvious source.  - Needs feraheme      Length of Stay: 2  Robbie Lis, PA-C  03/14/2022, 7:30 AM  Advanced Heart Failure Team Pager [phone] (M-F; 7a - 5p)  Please contact [facility] for night-coverage after hours (5p -7a ) and weekends on [email or web address]

## 2022-03-14 NOTE — Plan of Care (Signed)

## 2022-03-14 NOTE — Evaluation (Signed)
Physical Therapy Evaluation/ Discharge Patient Details Name: Keith Jenkins MRN: [id number] DOB: 03-Oct-1945 Today's Date: 03/14/2022  History of Present Illness  76 y.o. male admitted 10/4 from heart failure clinic with acute on chronic HFrEF and falls. PMhx: HLD, arthritis, glaucoma, COPD, GERD/dysphagia, CAD, systolic HF and mitral regurgitation s/p CABG/MVR 5/26.  Clinical Impression  Keith Jenkins familiar from prior admissions presents with falls related to being out in hot weather and getting dizzy or transitional movement standing from toilet after new medication. Pt unsure of what his HR or BP was with any of these events but no episodes of knee buckling, tripping or LOB. Pt with use of IV pole instead of cane this session able to ambulate and perform stairs without assist with VSS. Pt at baseline functional level with assist if wife and daughter as needed. Pt encouraged to continue cane use, BP monitoring and slow transitions. No further acute needs and will sign off with pt in agreement and pt aware of walking program.        Recommendations for follow up therapy are one component of a multi-disciplinary discharge planning process, led by the attending physician.  Recommendations may be updated based on patient status, additional functional criteria and insurance authorization.  Follow Up Recommendations No PT follow up      Assistance Recommended at Discharge PRN  Patient can return home with the following  Help with stairs or ramp for entrance    Equipment Recommendations None recommended by PT  Recommendations for Other Services       Functional Status Assessment Patient has not had a recent decline in their functional status     Precautions / Restrictions Precautions Precautions: Fall      Mobility  Bed Mobility Overal bed mobility: Modified Independent                  Transfers Overall transfer level: Modified independent                       Ambulation/Gait Ambulation/Gait assistance: Supervision Gait Distance (Feet): 400 Feet Assistive device: IV Pole Gait Pattern/deviations: Step-through pattern, Decreased stride length   Gait velocity interpretation: >2.62 ft/sec, indicative of community ambulatory   General Gait Details: pt with 3 standing rest breaks during gait with use of IV pole since cane not present. HR stable  Stairs Stairs: Yes Stairs assistance: Modified independent (Device/Increase time) Stair Management: One rail Left, Alternating pattern, Forwards Number of Stairs: 3    Wheelchair Mobility    Modified Rankin (Stroke Patients Only)       Balance Overall balance assessment: Mild deficits observed, not formally tested                                           Pertinent Vitals/Pain Pain Assessment Pain Assessment: No/denies pain    Home Living Family/patient expects to be discharged to:: Private residence Living Arrangements: Spouse/significant other Available Help at Discharge: Family;Available 24 hours/day Type of Home: House Home Access: Stairs to enter Entrance Stairs-Rails: None Entrance Stairs-Number of Steps: 3 Alternate Level Stairs-Number of Steps: 14 Home Layout: Two level;Able to live on main level with bedroom/bathroom Home Equipment: Kasandra Knudsen - single point;Rollator (4 wheels)      Prior Function Prior Level of Function : Independent/Modified Independent             Mobility Comments: SPC recently  ADLs Comments: Performs ADLs and IADLs     Hand Dominance   Dominant Hand: Right    Extremity/Trunk Assessment   Upper Extremity Assessment Upper Extremity Assessment: Overall WFL for tasks assessed    Lower Extremity Assessment Lower Extremity Assessment: Overall WFL for tasks assessed    Cervical / Trunk Assessment Cervical / Trunk Assessment: Normal  Communication   Communication: No difficulties  Cognition Arousal/Alertness:  Awake/alert Behavior During Therapy: WFL for tasks assessed/performed Overall Cognitive Status: Within Functional Limits for tasks assessed                                          General Comments      Exercises     Assessment/Plan    PT Assessment Patient does not need any further PT services  PT Problem List         PT Treatment Interventions      PT Goals (Current goals can be found in the Care Plan section)  Acute Rehab PT Goals PT Goal Formulation: All assessment and education complete, DC therapy    Frequency       Co-evaluation               AM-PAC PT "6 Clicks" Mobility  Outcome Measure Help needed turning from your back to your side while in a flat bed without using bedrails?: None Help needed moving from lying on your back to sitting on the side of a flat bed without using bedrails?: None Help needed moving to and from a bed to a chair (including a wheelchair)?: None Help needed standing up from a chair using your arms (e.g., wheelchair or bedside chair)?: None Help needed to walk in hospital room?: A Little Help needed climbing 3-5 steps with a railing? : None 6 Click Score: 23    End of Session Equipment Utilized During Treatment: Gait belt Activity Tolerance: Patient tolerated treatment well Patient left: in chair;with call bell/phone within reach;with family/visitor present Nurse Communication: Mobility status PT Visit Diagnosis: Other abnormalities of gait and mobility (R26.89)    Time: 1829-9371 PT Time Calculation (min) (ACUTE ONLY): 30 min   Charges:   PT Evaluation $PT Eval Low Complexity: 1 Low          Laurren Lepkowski P, PT Acute Rehabilitation Services Office: [phone]   Enedina Finner [place]earah Gayden 03/14/2022, 1:55 PM

## 2022-03-14 NOTE — Progress Notes (Signed)
Patient may need an increase in his midiorine with soft BP's at times.  Passed onto 1st shift to inform MD.Next dose at 1000 today

## 2022-03-14 NOTE — Progress Notes (Signed)
OT Cancellation Note  Patient Details Name: Keith Jenkins MRN: [id number] DOB: [birth year]   Cancelled Treatment:    Reason Eval/Treat Not Completed: OT screened, no needs identified, will sign off (discussed with PT, reports pt is at baseline with PRN family assist. Pt with no acute OT needs, will s/o. Please reconsult if there is a change in pt status.)  Lynnda Child, OTD, OTR/L Acute Rehab [id number] - [place] 03/14/2022, 2:16 PM

## 2022-03-15 DIAGNOSIS — I5023 Acute on chronic systolic (congestive) heart failure: Secondary | ICD-10-CM

## 2022-03-15 LAB — PROTIME-INR
INR: 2.3 — ABNORMAL HIGH (ref 0.8–1.2)
Prothrombin Time: 25.1 seconds — ABNORMAL HIGH (ref 11.4–15.2)

## 2022-03-15 LAB — COOXEMETRY PANEL
Carboxyhemoglobin: 1.3 % (ref 0.5–1.5)
Methemoglobin: <0.7 % (ref 0.0–1.5)
O2 Saturation: 55.6 %
Total hemoglobin: 8 g/dL — ABNORMAL LOW (ref 12.0–16.0)

## 2022-03-15 LAB — BASIC METABOLIC PANEL
Anion gap: 10 (ref 5–15)
BUN: 24 mg/dL — ABNORMAL HIGH (ref 8–23)
CO2: 25 mmol/L (ref 22–32)
Calcium: 8.9 mg/dL (ref 8.9–10.3)
Chloride: 100 mmol/L (ref 98–111)
Creatinine, Ser: 1.83 mg/dL — ABNORMAL HIGH (ref 0.61–1.24)
GFR, Estimated: 38 mL/min — ABNORMAL LOW (ref >60)
Glucose, Bld: 81 mg/dL (ref 70–99)
Potassium: 3.9 mmol/L (ref 3.5–5.1)
Sodium: 135 mmol/L (ref 135–145)

## 2022-03-15 MED ORDER — METOLAZONE 2.5 MG PO TABS
5.0000 mg | ORAL_TABLET | Freq: Once | ORAL | Status: AC
  Administered 2022-03-15: 5 mg via ORAL
  Filled 2022-03-15: qty 2

## 2022-03-15 NOTE — Plan of Care (Signed)

## 2022-03-15 NOTE — Progress Notes (Signed)
Mobility Specialist Progress Note:   03/15/22 0914  Mobility  Activity Ambulated with assistance in hallway  Activity Response Tolerated well  Distance Ambulated (ft) 400 ft  $Mobility charge 1 Mobility  Level of Assistance Standby assist, set-up cues, supervision of patient - no hands on  Assistive Device None   Pt received in bed willing to participate in mobility. No complaints of pain. Left in chair with call bell in reach and all needs met.   Keith Jenkins Mobility Specialist Secure Chat only   

## 2022-03-15 NOTE — Progress Notes (Signed)
ANTICOAGULATION CONSULT NOTE  Pharmacy Consult for Warfarin Indication: atrial fibrillation  No Known Allergies  Vital Signs: Temp: 97.6 F (36.4 C) (10/07 1103) Temp Source: Oral (10/07 1103) BP: 97/62 (10/07 1103) Pulse Rate: 62 (10/07 1409)  Labs: Recent Labs    03/12/22 1800 03/13/22 0355 03/14/22 0505 03/15/22 0454  HGB  --  7.9* 7.7*  --   HCT  --  25.2* 24.7*  --   PLT  --  219 200  --   LABPROT 24.9*  --  25.5* 25.1*  INR 2.3*  --  2.4* 2.3*  CREATININE  --  1.84* 1.85* 1.83*     Estimated Creatinine Clearance: 40 mL/min (A) (by C-G formula based on SCr of 1.83 mg/dL (H)).  Assessment: 89 yoM on warfarin PTA for hx AF and recent bMVR. INR remains therapeutic on home dose, CBC stable.  **Home dose of warfarin 3 mg M and F, 2 mg other days  Goal of Therapy:  INR 2-3 Monitor platelets by anticoagulation protocol: Yes   Plan:  Warfarin 2 mg PO x1 tonight Daily PT/INR.  [place] Crane, Roylene Reason, BCCP Clinical Pharmacist  03/15/2022 2:53 PM   [facility] pharmacy phone numbers are listed on [email or web address]

## 2022-03-15 NOTE — Progress Notes (Addendum)
Advanced Heart Failure Rounding Note  PCP-Cardiologist: None   Subjective/Interval Hx  - Reports improvement in dyspnea - SO2 55, net negative 1.4L, CVP 15  Objective:   Weight Range: 86.7 kg Body mass index is 25.22 kg/m.   Vital Signs:   Temp:  [97.6 F (36.4 C)-98.3 F (36.8 C)] 98.3 F (36.8 C) (10/07 0717) Pulse Rate:  [58-75] 75 (10/07 0717) Resp:  [15-21] 19 (10/07 0900) BP: (96-122)/(65-79) 122/79 (10/07 0717) SpO2:  [94 %-98 %] 94 % (10/07 0717) Weight:  [86.7 kg] 86.7 kg (10/07 0426) Last BM Date : 03/14/22  Weight change: Filed Weights   03/13/22 0500 03/14/22 0310 03/15/22 0426  Weight: 89.2 kg 88.3 kg 86.7 kg    Intake/Output:   Intake/Output Summary (Last 24 hours) at 03/15/2022 0957 Last data filed at 03/15/2022 0814 Gross per 24 hour  Intake 1260 ml  Output 2750 ml  Net -1490 ml       Physical Exam    CVP 14-15 General:  Well appearing. No respiratory difficulty HEENT: normal Neck: supple. JVD 11 cm. Carotids 2+ bilat; no bruits. No lymphadenopathy or thyromegaly appreciated. Cor: PMI nondisplaced. Regular rate & rhythm. 2/6 AS murmur Lungs: mild exp wheezing; improved with albuterol Abdomen: soft, nontender, nondistended. No hepatosplenomegaly. No bruits or masses. Good bowel sounds. Extremities: no cyanosis, clubbing, rash, trace - 1+ b/l LEE  + unna boots + RUE PIC Neuro: alert & oriented x 3, cranial nerves grossly intact. moves all 4 extremities w/o difficulty. Affect pleasant.    Telemetry   NSR 60s   EKG    N/A  Labs    CBC Recent Labs    03/13/22 0355 03/14/22 0505  WBC 4.8 4.6  HGB 7.9* 7.7*  HCT 25.2* 24.7*  MCV 68.3* 67.7*  PLT 219 175    Basic Metabolic Panel Recent Labs    03/14/22 0505 03/15/22 0454  NA 137 135  K 3.5 3.9  CL 103 100  CO2 26 25  GLUCOSE 92 81  BUN 29* 24*  CREATININE 1.85* 1.83*  CALCIUM 8.7* 8.9    Liver Function Tests Recent Labs    03/12/22 1220  AST 35  ALT 30   ALKPHOS 90  BILITOT 1.1  PROT 6.7  ALBUMIN 3.2*    BNP (last 3 results) Recent Labs    01/28/22 0926 02/14/22 1205 03/12/22 1220  BNP 1,033.4* 1,321.3* 1,417.5*      Imaging   - pertinent imaging reviewed  Medications:     Scheduled Medications:  amiodarone  100 mg Oral Daily   aspirin EC  81 mg Oral Daily   atorvastatin  80 mg Oral Daily   Chlorhexidine Gluconate Cloth  6 each Topical Daily   dapagliflozin propanediol  10 mg Oral Daily   digoxin  0.0625 mg Oral Daily   escitalopram  10 mg Oral Daily   ezetimibe  5 mg Oral Daily   furosemide  80 mg Intravenous Q12H   latanoprost  1 drop Both Eyes QHS   midodrine  10 mg Oral TID   pantoprazole  80 mg Oral Q1200   potassium chloride SA  40 mEq Oral Daily   sodium chloride flush  10-40 mL Intracatheter Q12H   sodium chloride flush  3 mL Intravenous Q12H   spironolactone  25 mg Oral Daily   traZODone  50 mg Oral QHS   umeclidinium bromide  1 puff Inhalation Daily   warfarin  2 mg Oral Once per day on  [place] Link Tue Wed Thu Sat   And   warfarin  3 mg Oral Once per day on Fri   Warfarin - Pharmacist Dosing Inpatient   Does not apply q1600    Infusions:  sodium chloride      PRN Medications: sodium chloride, acetaminophen, albuterol, diphenhydrAMINE, nitroGLYCERIN, ondansetron (ZOFRAN) IV, mouth rinse, sodium chloride flush, sodium chloride flush    Patient Profile  Keith Jenkins is a 76 y.o. male with COPD, GERD/dysphagia, CAD, mitral regurgitation s/p CABG/MVR 5/23, and HFrEF.   Admitted with A/C HFrEF.  Assessment/Plan   1. Acute on Chronic systolic CHF /ICM - s/p late presentation inferior STEMI 05/23 - Echo (5/23): EF 25-30%, RV moderately reduced, moderate to severe MR. - S/p CABG + bioprosthetic MVR 11/01/21 - Echo 03/12/22 showed EF 20-25%, severe LV dysfunction with global HK, + large Left pleural effusion, RV moderately down, moderate aortic stenosis, suspect component of low gradient AS. - On admit  NYHA IV - Diuresing w/ IV Lasix, CVP 15 today with mixed venous of 55.  - Continue IV Lasix 80 mg bid, adding metolazone 5mg  today. - On digoxin 0.0625 mg daily, (dig level 0.9), will repeat prior to D/C - Continue Farxiga 10 mg daily.  - Continue spiro 25mg  daily - Continue midodrine 10 mg tid. - No beta blocker with decompensation/volume overload. - Will need to consider VAD if he is candidate   2. CAD/recent late presentation inferior STEMI - LHC 05/23: 100% m RCA and 90% m LAD.  - RCA managed medically d/t completed infarct.  - Echo (5/23) EF 25%, RV moderately reduced, moderate to severe MR - s/p CABG x 2 (LIMA-LAD, SVG-Ramus INT)  (11/01/21) - Continue ASA + high intensity statin.    3. Mitral regurgitation - Moderate to severe ischemic MR on TEE.  - S/p bioprosthetic MVR 11/02/21. - On warfarin per TCTS (3 months post valve). INR followed by [facility].  - INR stable   4. LFLG Aortic stenosis - Moderate on echo this admit.   5. H/o Pericardial effusion/Acute Pericarditis  - Moderate in size on past echo, w/o tamponade. - Acute pericarditis/ adhensions noted at time of CABG. - Resolved on echo.   6. PAF - s/p MAZE w/ CABG  - Maintaining SR.  - On warfarin. Followed by coumadin clinic.  - INR stable - Continue amiodarone 100 mg daily.    7. Left pleural effusion - Large on echo this admit.  - reported as small on CXR  - Diurese as above.   8. Esophageal stricture - Also has h/o esophageal stricture (was unable to pass TEE probe transgastric prior to CABG) - Barium swallow with distal stricture.  - Dr. 11/03/21 planning EGD with dilation. Need to hold off for right now with decompensation.  9. Anemia, iron-deficiency - Needs feraheme     Length of Stay: 3  Ambers Iyengar, DO  03/15/2022, 9:57 AM  Advanced Heart Failure Team Pager [phone] (M-F; 7a - 5p)  Please contact [facility] for night-coverage after hours (5p -7a ) and weekends on  [email or web address]

## 2022-03-15 NOTE — Plan of Care (Signed)

## 2022-03-16 DIAGNOSIS — I5023 Acute on chronic systolic (congestive) heart failure: Secondary | ICD-10-CM | POA: Diagnosis not present

## 2022-03-16 LAB — COOXEMETRY PANEL
Carboxyhemoglobin: 1.7 % — ABNORMAL HIGH (ref 0.5–1.5)
Methemoglobin: 0.9 % (ref 0.0–1.5)
O2 Saturation: 60.6 %
Total hemoglobin: 10 g/dL — ABNORMAL LOW (ref 12.0–16.0)

## 2022-03-16 LAB — BASIC METABOLIC PANEL
Anion gap: 11 (ref 5–15)
BUN: 24 mg/dL — ABNORMAL HIGH (ref 8–23)
CO2: 27 mmol/L (ref 22–32)
Calcium: 9.2 mg/dL (ref 8.9–10.3)
Chloride: 99 mmol/L (ref 98–111)
Creatinine, Ser: 1.83 mg/dL — ABNORMAL HIGH (ref 0.61–1.24)
GFR, Estimated: 38 mL/min — ABNORMAL LOW (ref >60)
Glucose, Bld: 84 mg/dL (ref 70–99)
Potassium: 3.5 mmol/L (ref 3.5–5.1)
Sodium: 137 mmol/L (ref 135–145)

## 2022-03-16 LAB — PROTIME-INR
INR: 2.2 — ABNORMAL HIGH (ref 0.8–1.2)
Prothrombin Time: 24.5 seconds — ABNORMAL HIGH (ref 11.4–15.2)

## 2022-03-16 MED ORDER — WARFARIN SODIUM 3 MG PO TABS: 3.0000 mg | ORAL_TABLET | ORAL | Status: DC

## 2022-03-16 MED ORDER — WARFARIN SODIUM 2 MG PO TABS: 2.0000 mg | ORAL_TABLET | ORAL | Status: DC

## 2022-03-16 MED ORDER — SODIUM CHLORIDE 0.9 % IV SOLN
510.0000 mg | Freq: Once | INTRAVENOUS | Status: AC
  Administered 2022-03-16: 510 mg via INTRAVENOUS
  Filled 2022-03-16: qty 17

## 2022-03-16 MED ORDER — POTASSIUM CHLORIDE CRYS ER 20 MEQ PO TBCR
20.0000 meq | EXTENDED_RELEASE_TABLET | Freq: Once | ORAL | Status: AC
  Administered 2022-03-16: 20 meq via ORAL
  Filled 2022-03-16: qty 1

## 2022-03-16 NOTE — Progress Notes (Signed)
Advanced Heart Failure Rounding Note  PCP-Cardiologist: None   Subjective/Interval Hx   Diuresing like a champ. Nearly 5L out. Weight down 3.5 pounds. Scr stable at 1.8. Co-ox 61%  K 3.5  CVP 8-9  Breathing better. Denies orthopnea or PND.  Walked 400 ft with mobility team   Objective:   Weight Range: 86.7 kg Body mass index is 25.22 kg/m.   Vital Signs:   Temp:  [97.4 F (36.3 C)-98.2 F (36.8 C)] 98.2 F (36.8 C) (10/08 1149) Pulse Rate:  [58-64] 58 (10/08 0314) Resp:  [14-20] 15 (10/08 1149) BP: (78-115)/(42-65) 85/49 (10/08 1149) SpO2:  [93 %-98 %] 97 % (10/08 1149) Last BM Date : 03/14/22  Weight change: Filed Weights   03/13/22 0500 03/14/22 0310 03/15/22 0426  Weight: 89.2 kg 88.3 kg 86.7 kg    Intake/Output:   Intake/Output Summary (Last 24 hours) at 03/16/2022 1206 Last data filed at 03/16/2022 0914 Gross per 24 hour  Intake 360 ml  Output 4825 ml  Net -4465 ml       Physical Exam    General:  Lying flat in bed  No resp difficulty HEENT: normal Neck: supple. JVP 8-9 Carotids 2+ bilat; no bruits. No lymphadenopathy or thryomegaly appreciated. Cor: PMI nondisplaced. Regular rate & rhythm. 2.6 AS s2 mildly decreased Lungs: clear Abdomen: soft, nontender, nondistended. No hepatosplenomegaly. No bruits or masses. Good bowel sounds. Extremities: no cyanosis, clubbing, rash, tr ankle edema Neuro: alert & orientedx3, cranial nerves grossly intact. moves all 4 extremities w/o difficulty. Affect pleasant   Telemetry   NSR 50-60s  Personally reviewed  Labs    CBC Recent Labs    03/14/22 0505  WBC 4.6  HGB 7.7*  HCT 24.7*  MCV 67.7*  PLT 200    Basic Metabolic Panel Recent Labs    87/56/43 0454 03/16/22 0500  NA 135 137  K 3.9 3.5  CL 100 99  CO2 25 27  GLUCOSE 81 84  BUN 24* 24*  CREATININE 1.83* 1.83*  CALCIUM 8.9 9.2    Liver Function Tests No results for input(s): "AST", "ALT", "ALKPHOS", "BILITOT", "PROT", "ALBUMIN"  in the last 72 hours.  BNP (last 3 results) Recent Labs    01/28/22 0926 02/14/22 1205 03/12/22 1220  BNP 1,033.4* 1,321.3* 1,417.5*      Imaging   - pertinent imaging reviewed  Medications:     Scheduled Medications:  amiodarone  100 mg Oral Daily   aspirin EC  81 mg Oral Daily   atorvastatin  80 mg Oral Daily   Chlorhexidine Gluconate Cloth  6 each Topical Daily   dapagliflozin propanediol  10 mg Oral Daily   digoxin  0.0625 mg Oral Daily   escitalopram  10 mg Oral Daily   ezetimibe  5 mg Oral Daily   furosemide  80 mg Intravenous Q12H   latanoprost  1 drop Both Eyes QHS   midodrine  10 mg Oral TID   pantoprazole  80 mg Oral Q1200   potassium chloride SA  40 mEq Oral Daily   sodium chloride flush  10-40 mL Intracatheter Q12H   sodium chloride flush  3 mL Intravenous Q12H   spironolactone  25 mg Oral Daily   traZODone  50 mg Oral QHS   umeclidinium bromide  1 puff Inhalation Daily   warfarin  2 mg Oral Once per day on Sun Tue Wed Thu Sat   And   [START ON 03/17/2022] warfarin  3 mg Oral Once per  day on Mon Fri   Warfarin - Pharmacist Dosing Inpatient   Does not apply q1600    Infusions:  sodium chloride      PRN Medications: sodium chloride, acetaminophen, albuterol, diphenhydrAMINE, nitroGLYCERIN, ondansetron (ZOFRAN) IV, mouth rinse, sodium chloride flush, sodium chloride flush    Patient Profile  Keith Jenkins is a 76 y.o. male with COPD, GERD/dysphagia, CAD, mitral regurgitation s/p CABG/MVR 5/23, and HFrEF.   Admitted with A/C HFrEF.  Assessment/Plan   1. Acute on Chronic systolic CHF /ICM - s/p late presentation inferior STEMI 05/23 - Echo (5/23): EF 25-30%, RV moderately reduced, moderate to severe MR. - S/p CABG + bioprosthetic MVR 11/01/21 - Echo 03/12/22 showed EF 20-25%, severe LV dysfunction with global HK, + large Left pleural effusion, RV moderately down, moderate aortic stenosis, suspect component of low gradient AS. - On admit NYHA IV -  Brisk diuresis with IV lasix. Co-ox improved to 61%  - Remains mldly volume overloaded. CVP 8-9 - Continue IV lasix one more day. Watch renal function closely - On digoxin 0.0625 mg daily, (dig level 0.9), will repeat prior to D/C - Continue Farxiga 10 mg daily.  - Continue spiro 25mg  daily - Continue midodrine 10 mg tid. - No beta blocker with decompensation/volume overload. - Will need to consider VAD if he is candidate. Likely RHC tomorrow   2. CAD/recent late presentation inferior STEMI - LHC 05/23: 100% m RCA and 90% m LAD.  - RCA managed medically d/t completed infarct.  - Echo (5/23) EF 25%, RV moderately reduced, moderate to severe MR - s/p CABG x 2 (LIMA-LAD, SVG-Ramus INT)  (11/01/21) - Continue ASA + high intensity statin.    3. Mitral regurgitation - Moderate to severe ischemic MR on TEE.  - S/p bioprosthetic MVR 11/02/21. - On warfarin per TCTS (3 months post valve). INR followed by [facility].     4. LFLG Aortic stenosis - Moderate on echo this admit.   5. H/o Pericardial effusion/Acute Pericarditis  - Moderate in size on past echo, w/o tamponade. - Acute pericarditis/ adhensions noted at time of CABG. - Resolved on echo.   6. PAF - s/p MAZE w/ CABG  - Maintaining SR.  - On warfarin.  - INR stable 2.2 - Continue amiodarone 100 mg daily.  - Will hold warfarin tonight. D/w PharmD. Until we discuss with VAD team tomorrow to [place] next steps    7. Left pleural effusion - Large on echo this admit.  - reported as small on CXR  - Diurese as above.   8. Esophageal stricture - Also has h/o esophageal stricture (was unable to pass TEE probe transgastric prior to CABG) - Barium swallow with distal stricture.  - Dr. Hilarie Fredrickson planning EGD with dilation. Need to hold off for right now with decompensation.  9. Anemia, iron-deficiency - REc'd feraheme on 10/5. Will likely need additional dosing. D/w PharmD    Length of Stay: [place], MD   03/16/2022, 12:06 PM  Advanced Heart Failure Team Pager [phone] (M-F; 7a - 5p)  Please contact [facility] for night-coverage after hours (5p -7a ) and weekends on [email or web address]

## 2022-03-16 NOTE — Progress Notes (Signed)
Mobility Specialist Progress Note:   03/16/22 0959  Mobility  Activity Ambulated with assistance in hallway  Activity Response Tolerated well  Distance Ambulated (ft) 400 ft  $Mobility charge 1 Mobility  Level of Assistance Standby assist, set-up cues, supervision of patient - no hands on  Assistive Device None   Pt received EOB willing to participate in mobility. No complaints of pain. Left in chair with call bell in reach and all needs met.   [facility] Surveyor, mining Chat only

## 2022-03-16 NOTE — Progress Notes (Signed)
Mobility Specialist Progress Note:   03/16/22 0959  Mobility  Activity Ambulated with assistance in hallway  Activity Response Tolerated well  Distance Ambulated (ft) 400 ft  $Mobility charge 1 Mobility  Level of Assistance Standby assist, set-up cues, supervision of patient - no hands on  Assistive Device None   Pt received EOB willing to participate in mobility. No complaints of pain. Left in chair with call bell in reach and all needs met.   Keith Jenkins Mobility Specialist Secure Chat only   

## 2022-03-16 NOTE — Progress Notes (Addendum)
ANTICOAGULATION CONSULT NOTE  Pharmacy Consult for Warfarin Indication: atrial fibrillation  No Known Allergies  Vital Signs: Temp: 98.2 F (36.8 C) (10/08 0314) Temp Source: Oral (10/08 0314) BP: 78/42 (10/08 0314) Pulse Rate: 58 (10/08 0314)  Labs: Recent Labs    03/14/22 0505 03/15/22 0454 03/16/22 0500  HGB 7.7*  --   --   HCT 24.7*  --   --   PLT 200  --   --   LABPROT 25.5* 25.1* 24.5*  INR 2.4* 2.3* 2.2*  CREATININE 1.85* 1.83* 1.83*     Estimated Creatinine Clearance: 40 mL/min (A) (by C-G formula based on SCr of 1.83 mg/dL (H)).  Assessment: 79 yoM on warfarin PTA for hx AF and recent bMVR. INR remains therapeutic on home dose, CBC stable.  **Home dose of warfarin 3 mg M and F, 2 mg other days  Goal of Therapy:  INR 2-3 Monitor platelets by anticoagulation protocol: Yes   Plan:  Discussed with Dr. Haroldine Laws, hold Coumadin tonight until VAD plans/timing decided. Daily PT/INR.  [place] Crane, Roylene Reason, BCCP Clinical Pharmacist  03/16/2022 7:07 AM   [facility] pharmacy phone numbers are listed on [email or web address]

## 2022-03-17 ENCOUNTER — Encounter (HOSPITAL_COMMUNITY)
Admission: RE | Disposition: A | Discharge status: Home / Self Care | Payer: Self-pay | Source: Home / Self Care | Attending: Internal Medicine

## 2022-03-17 DIAGNOSIS — I5023 Acute on chronic systolic (congestive) heart failure: Secondary | ICD-10-CM | POA: Diagnosis not present

## 2022-03-17 HISTORY — PX: RIGHT HEART CATH: CATH118263

## 2022-03-17 LAB — POCT I-STAT EG7
Acid-Base Excess: 1 mmol/L (ref 0.0–2.0)
Acid-Base Excess: 3 mmol/L — ABNORMAL HIGH (ref 0.0–2.0)
Acid-Base Excess: 4 mmol/L — ABNORMAL HIGH (ref 0.0–2.0)
Bicarbonate: 24.8 mmol/L (ref 20.0–28.0)
Bicarbonate: 27.5 mmol/L (ref 20.0–28.0)
Bicarbonate: 28.5 mmol/L — ABNORMAL HIGH (ref 20.0–28.0)
Calcium, Ion: 0.96 mmol/L — ABNORMAL LOW (ref 1.15–1.40)
Calcium, Ion: 1.12 mmol/L — ABNORMAL LOW (ref 1.15–1.40)
Calcium, Ion: 1.15 mmol/L (ref 1.15–1.40)
HCT: 28 % — ABNORMAL LOW (ref 39.0–52.0)
HCT: 30 % — ABNORMAL LOW (ref 39.0–52.0)
HCT: 31 % — ABNORMAL LOW (ref 39.0–52.0)
Hemoglobin: 10.2 g/dL — ABNORMAL LOW (ref 13.0–17.0)
Hemoglobin: 10.5 g/dL — ABNORMAL LOW (ref 13.0–17.0)
Hemoglobin: 9.5 g/dL — ABNORMAL LOW (ref 13.0–17.0)
O2 Saturation: 54 %
O2 Saturation: 59 %
O2 Saturation: 63 %
Potassium: 3.2 mmol/L — ABNORMAL LOW (ref 3.5–5.1)
Potassium: 3.6 mmol/L (ref 3.5–5.1)
Potassium: 3.7 mmol/L (ref 3.5–5.1)
Sodium: 136 mmol/L (ref 135–145)
Sodium: 137 mmol/L (ref 135–145)
Sodium: 141 mmol/L (ref 135–145)
TCO2: 26 mmol/L (ref 22–32)
TCO2: 29 mmol/L (ref 22–32)
TCO2: 30 mmol/L (ref 22–32)
pCO2, Ven: 36.6 mmHg — ABNORMAL LOW (ref 44–60)
pCO2, Ven: 39.6 mmHg — ABNORMAL LOW (ref 44–60)
pCO2, Ven: 41.2 mmHg — ABNORMAL LOW (ref 44–60)
pH, Ven: 7.439 — ABNORMAL HIGH (ref 7.25–7.43)
pH, Ven: 7.449 — ABNORMAL HIGH (ref 7.25–7.43)
pH, Ven: 7.449 — ABNORMAL HIGH (ref 7.25–7.43)
pO2, Ven: 27 mmHg — CL (ref 32–45)
pO2, Ven: 29 mmHg — CL (ref 32–45)
pO2, Ven: 31 mmHg — CL (ref 32–45)

## 2022-03-17 LAB — TYPE AND SCREEN
ABO/RH(D): O POS
Antibody Screen: NEGATIVE

## 2022-03-17 LAB — COOXEMETRY PANEL
Carboxyhemoglobin: 0.9 % (ref 0.5–1.5)
Methemoglobin: <0.7 % (ref 0.0–1.5)
O2 Saturation: 55.4 %
Total hemoglobin: 9.3 g/dL — ABNORMAL LOW (ref 12.0–16.0)

## 2022-03-17 LAB — CBC
HCT: 27.9 % — ABNORMAL LOW (ref 39.0–52.0)
Hemoglobin: 9 g/dL — ABNORMAL LOW (ref 13.0–17.0)
MCH: 21.9 pg — ABNORMAL LOW (ref 26.0–34.0)
MCHC: 32.3 g/dL (ref 30.0–36.0)
MCV: 67.9 fL — ABNORMAL LOW (ref 80.0–100.0)
Platelets: 241 10*3/uL (ref 150–400)
RBC: 4.11 MIL/uL — ABNORMAL LOW (ref 4.22–5.81)
RDW: 20.2 % — ABNORMAL HIGH (ref 11.5–15.5)
WBC: 5.4 10*3/uL (ref 4.0–10.5)
nRBC: 0 % (ref 0.0–0.2)

## 2022-03-17 LAB — BASIC METABOLIC PANEL
Anion gap: 10 (ref 5–15)
BUN: 26 mg/dL — ABNORMAL HIGH (ref 8–23)
CO2: 27 mmol/L (ref 22–32)
Calcium: 9.1 mg/dL (ref 8.9–10.3)
Chloride: 97 mmol/L — ABNORMAL LOW (ref 98–111)
Creatinine, Ser: 1.85 mg/dL — ABNORMAL HIGH (ref 0.61–1.24)
GFR, Estimated: 38 mL/min — ABNORMAL LOW (ref >60)
Glucose, Bld: 86 mg/dL (ref 70–99)
Potassium: 3.4 mmol/L — ABNORMAL LOW (ref 3.5–5.1)
Sodium: 134 mmol/L — ABNORMAL LOW (ref 135–145)

## 2022-03-17 LAB — MAGNESIUM: Magnesium: 2.3 mg/dL (ref 1.7–2.4)

## 2022-03-17 LAB — PROTIME-INR
INR: 2.3 — ABNORMAL HIGH (ref 0.8–1.2)
Prothrombin Time: 25.1 seconds — ABNORMAL HIGH (ref 11.4–15.2)

## 2022-03-17 SURGERY — RIGHT HEART CATH
Anesthesia: LOCAL

## 2022-03-17 MED ORDER — HEPARIN (PORCINE) IN NACL 1000-0.9 UT/500ML-% IV SOLN
INTRAVENOUS | Status: AC
  Filled 2022-03-17: qty 500

## 2022-03-17 MED ORDER — SODIUM CHLORIDE 0.9 % IV SOLN: INTRAVENOUS | Status: DC

## 2022-03-17 MED ORDER — SODIUM CHLORIDE 0.9% FLUSH: 3.0000 mL | INTRAVENOUS | Status: DC | PRN

## 2022-03-17 MED ORDER — SODIUM CHLORIDE 0.9% FLUSH
3.0000 mL | Freq: Two times a day (BID) | INTRAVENOUS | Status: DC
  Administered 2022-03-17 (×2): 3 mL via INTRAVENOUS

## 2022-03-17 MED ORDER — SODIUM CHLORIDE 0.9% FLUSH
3.0000 mL | Freq: Two times a day (BID) | INTRAVENOUS | Status: DC
  Administered 2022-03-18: 3 mL via INTRAVENOUS

## 2022-03-17 MED ORDER — FENTANYL CITRATE (PF) 100 MCG/2ML IJ SOLN
INTRAMUSCULAR | Status: DC | PRN
  Administered 2022-03-17: 25 ug via INTRAVENOUS

## 2022-03-17 MED ORDER — LIDOCAINE HCL (PF) 1 % IJ SOLN
INTRAMUSCULAR | Status: DC | PRN
  Administered 2022-03-17: 2 mL

## 2022-03-17 MED ORDER — HEPARIN (PORCINE) IN NACL 1000-0.9 UT/500ML-% IV SOLN
INTRAVENOUS | Status: DC | PRN
  Administered 2022-03-17: 500 mL

## 2022-03-17 MED ORDER — SODIUM CHLORIDE 0.9 % IV SOLN: 250.0000 mL | INTRAVENOUS | Status: DC | PRN

## 2022-03-17 MED ORDER — POTASSIUM CHLORIDE CRYS ER 20 MEQ PO TBCR
40.0000 meq | EXTENDED_RELEASE_TABLET | Freq: Once | ORAL | Status: AC
  Administered 2022-03-17: 40 meq via ORAL
  Filled 2022-03-17: qty 2

## 2022-03-17 MED ORDER — ACETAMINOPHEN 325 MG PO TABS: 650.0000 mg | ORAL_TABLET | ORAL | Status: DC | PRN

## 2022-03-17 MED ORDER — LIDOCAINE HCL (PF) 1 % IJ SOLN
INTRAMUSCULAR | Status: AC
  Filled 2022-03-17: qty 30

## 2022-03-17 MED ORDER — FENTANYL CITRATE (PF) 100 MCG/2ML IJ SOLN
INTRAMUSCULAR | Status: AC
  Filled 2022-03-17: qty 2

## 2022-03-17 MED ORDER — ONDANSETRON HCL 4 MG/2ML IJ SOLN: 4.0000 mg | Freq: Four times a day (QID) | INTRAMUSCULAR | Status: DC | PRN

## 2022-03-17 SURGICAL SUPPLY — 10 items
CATH BALLN WEDGE 5F 110CM (CATHETERS) IMPLANT
CATH SWAN GANZ 7F STRAIGHT (CATHETERS) IMPLANT
CATH SWAN GANZ 7FR S TIP (CATHETERS) IMPLANT
GLIDESHEATH SLENDER 7FR .021G (SHEATH) IMPLANT
GUIDEWIRE .025 260CM (WIRE) IMPLANT
KIT MICROPUNCTURE NIT STIFF (SHEATH) IMPLANT
PACK CARDIAC CATHETERIZATION (CUSTOM PROCEDURE TRAY) ×1 IMPLANT
SHEATH GLIDE SLENDER 4/5FR (SHEATH) IMPLANT
SHEATH PROBE COVER 6X72 (BAG) IMPLANT
TRANSDUCER W/STOPCOCK (MISCELLANEOUS) ×1 IMPLANT

## 2022-03-17 NOTE — Progress Notes (Addendum)
Advanced Heart Failure Rounding Note  PCP-Cardiologist: None   Subjective/Interval Hx   Net -10+L. -3.3l UOP yesterday. Need new weight. Scr stable at 1.8. Co-ox 55%  K 3.4  CVP 9  Breathing better. Denies orthopnea or PND.  Objective:   Weight Range: 86.7 kg Body mass index is 25.22 kg/m.   Vital Signs:   Temp:  [97.6 F (36.4 C)-98.5 F (36.9 C)] 98.1 F (36.7 C) (10/09 0427) Pulse Rate:  [56-58] 56 (10/09 0427) Resp:  [15-20] 16 (10/09 0427) BP: (83-105)/(49-72) 105/68 (10/09 0427) SpO2:  [97 %-98 %] 97 % (10/08 1705) Last BM Date : 03/14/22  Weight change: Filed Weights   03/13/22 0500 03/14/22 0310 03/15/22 0426  Weight: 89.2 kg 88.3 kg 86.7 kg    Intake/Output:   Intake/Output Summary (Last 24 hours) at 03/17/2022 0736 Last data filed at 03/17/2022 0155 Gross per 24 hour  Intake 1140 ml  Output 3300 ml  Net -2160 ml      Physical Exam    General:  well, elderly appearing.  No respiratory difficulty HEENT: normal Neck: supple. JVD 9 cm. Carotids 2+ bilat; no bruits. No lymphadenopathy or thyromegaly appreciated. Cor: PMI nondisplaced. Regular rate & rhythm. No rubs, gallops or murmurs. Lungs: clear Abdomen: soft, nontender, nondistended. No hepatosplenomegaly. No bruits or masses. Good bowel sounds. Extremities: no cyanosis, clubbing, rash, edema. + BLE UNNA Neuro: alert & oriented x 3, cranial nerves grossly intact. moves all 4 extremities w/o difficulty. Affect pleasant.    Telemetry   NSR 60s (Personally reviewed)    Labs    CBC Recent Labs    03/17/22 0552  WBC 5.4  HGB 9.0*  HCT 27.9*  MCV 67.9*  PLT 241   Basic Metabolic Panel Recent Labs    25/85/27 0500 03/17/22 0552  NA 137 134*  K 3.5 3.4*  CL 99 97*  CO2 27 27  GLUCOSE 84 86  BUN 24* 26*  CREATININE 1.83* 1.85*  CALCIUM 9.2 9.1   Liver Function Tests No results for input(s): "AST", "ALT", "ALKPHOS", "BILITOT", "PROT", "ALBUMIN" in the last 72 hours.  BNP  (last 3 results) Recent Labs    01/28/22 0926 02/14/22 1205 03/12/22 1220  BNP 1,033.4* 1,321.3* 1,417.5*     Imaging   - pertinent imaging reviewed  Medications:     Scheduled Medications:  amiodarone  100 mg Oral Daily   aspirin EC  81 mg Oral Daily   atorvastatin  80 mg Oral Daily   Chlorhexidine Gluconate Cloth  6 each Topical Daily   dapagliflozin propanediol  10 mg Oral Daily   digoxin  0.0625 mg Oral Daily   escitalopram  10 mg Oral Daily   ezetimibe  5 mg Oral Daily   furosemide  80 mg Intravenous Q12H   latanoprost  1 drop Both Eyes QHS   midodrine  10 mg Oral TID   pantoprazole  80 mg Oral Q1200   potassium chloride SA  40 mEq Oral Daily   sodium chloride flush  10-40 mL Intracatheter Q12H   sodium chloride flush  3 mL Intravenous Q12H   spironolactone  25 mg Oral Daily   traZODone  50 mg Oral QHS   umeclidinium bromide  1 puff Inhalation Daily    Infusions:  sodium chloride      PRN Medications: sodium chloride, acetaminophen, albuterol, diphenhydrAMINE, nitroGLYCERIN, ondansetron (ZOFRAN) IV, mouth rinse, sodium chloride flush, sodium chloride flush    Patient Profile  Keith Jenkins is a  76 y.o. male with COPD, GERD/dysphagia, CAD, mitral regurgitation s/p CABG/MVR 5/23, and HFrEF.   Admitted with A/C HFrEF.  Assessment/Plan   1. Acute on Chronic systolic CHF /ICM - s/p late presentation inferior STEMI 05/23 - Echo (5/23): EF 25-30%, RV moderately reduced, moderate to severe MR. - S/p CABG + bioprosthetic MVR 11/01/21 - Echo 03/12/22 showed EF 20-25%, severe LV dysfunction with global HK, + large Left pleural effusion, RV moderately down, moderate aortic stenosis, suspect component of low gradient AS. - On admit NYHA IV - Brisk diuresis with IV lasix. Co-ox 55% today - Remains mildly volume overloaded. CVP 9 - IV lasix through today, transition to PO tomorrow. Watch renal function closely - On digoxin 0.0625 mg daily, (dig level 0.9), will  repeat prior to D/C - Continue Farxiga 10 mg daily.  - Continue spiro 25mg  daily - Continue midodrine 10 mg tid. - No beta blocker with decompensation/volume overload. - Will need to consider VAD if he is candidate. Possible RHC today   2. CAD/recent late presentation inferior STEMI - LHC 05/23: 100% m RCA and 90% m LAD.  - RCA managed medically d/t completed infarct.  - Echo (5/23) EF 25%, RV moderately reduced, moderate to severe MR - s/p CABG x 2 (LIMA-LAD, SVG-Ramus INT)  (11/01/21) - Continue ASA + high intensity statin.    3. Mitral regurgitation - Moderate to severe ischemic MR on TEE.  - S/p bioprosthetic MVR 11/02/21. - On warfarin per TCTS (3 months post valve). INR followed by [facility].    4. LFLG Aortic stenosis - Moderate on echo this admit.   5. H/o Pericardial effusion/Acute Pericarditis  - Moderate in size on past echo, w/o tamponade. - Acute pericarditis/ adhensions noted at time of CABG. - Resolved on echo.   6. PAF - s/p MAZE w/ CABG  - Maintaining SR.  - On warfarin.  - INR stable 2.3 - Continue amiodarone 100 mg daily.  - Will hold warfarin tonight. D/w PharmD. Until we discuss with VAD team tomorrow to [place] next steps    7. Left pleural effusion - Large on echo this admit.  - reported as small on CXR  - Diurese as above.   8. Esophageal stricture - Also has h/o esophageal stricture (was unable to pass TEE probe transgastric prior to CABG) - Barium swallow with distal stricture.  - Dr. Hilarie Fredrickson planning EGD with dilation. Need to hold off for right now with decompensation.  9. Anemia, iron-deficiency - Received feraheme 10/5. Will likely need additional dosing. D/w PharmD    Length of Stay: [address], AGACNP-BC  03/17/2022, 7:36 AM  Advanced Heart Failure Team Pager [phone] (M-F; 7a - 5p)  Please contact [facility] for night-coverage after hours (5p -7a ) and weekends on [email or web address]  Patient seen and examined with the  above-signed Advanced Practice Provider and/or Housestaff. I personally reviewed laboratory data, imaging studies and relevant notes. I independently examined the patient and formulated the important aspects of the plan. I have edited the note to reflect any of my changes or salient points. I have personally discussed the plan with the patient and/or family.  Feeling better. Denies dyspnea, orthopnea or PND. Has been walking halls with assistance.  General:  Well appearing. No resp difficulty HEENT: normal Neck: supple. JVP 9 Carotids 2+ bilat; no bruits. No lymphadenopathy or thryomegaly appreciated. Cor: PMI nondisplaced. Regular rate & rhythm. No rubs, gallops or murmurs. Lungs: clear Abdomen: soft, nontender, nondistended. No  hepatosplenomegaly. No bruits or masses. Good bowel sounds. Extremities: no cyanosis, clubbing, rash, edema Neuro: alert & orientedx3, cranial nerves grossly intact. moves all 4 extremities w/o difficulty. Affect pleasant  Improving with diuresis. Renal function stable. Will plan RHC today. VAD team to see in consult. Warfarin on hold. INR 2.3. Has gotten feraheme x 2  Glori Bickers, MD  8:22 AM

## 2022-03-17 NOTE — Progress Notes (Signed)
Transported to the cath lab by bed awake and alert. 

## 2022-03-17 NOTE — Interval H&P Note (Signed)
History and Physical Interval Note:  03/17/2022 2:38 PM  Keith Jenkins  has presented today for surgery, with the diagnosis of HF.  The various methods of treatment have been discussed with the patient and family. After consideration of risks, benefits and other options for treatment, the patient has consented to  Procedure(s): RIGHT HEART CATH (N/A) as a surgical intervention.  The patient's history has been reviewed, patient examined, no change in status, stable for surgery.  I have reviewed the patient's chart and labs.  Questions were answered to the patient's satisfaction.     Jerone Cudmore

## 2022-03-17 NOTE — Progress Notes (Signed)
ANTICOAGULATION CONSULT NOTE  Pharmacy Consult for Warfarin Indication: atrial fibrillation  No Known Allergies  Vital Signs: Temp: 98 F (36.7 C) (10/09 0819) Temp Source: Oral (10/09 0819) BP: 92/55 (10/09 0819) Pulse Rate: 58 (10/09 0819)  Labs: Recent Labs    03/15/22 0454 03/16/22 0500 03/17/22 0552  HGB  --   --  9.0*  HCT  --   --  27.9*  PLT  --   --  241  LABPROT 25.1* 24.5* 25.1*  INR 2.3* 2.2* 2.3*  CREATININE 1.83* 1.83* 1.85*     Estimated Creatinine Clearance: 39.6 mL/min (A) (by C-G formula based on SCr of 1.85 mg/dL (H)).  Assessment: 41 yoM on warfarin PTA for hx AF and recent bMVR.  **Home dose of warfarin 3 mg M and F, 2 mg other days  INR is therapeutic at 2.3 - warfarin held last night given possible VAD workup. Plan for [place] on 10/9. Hgb 9, plt 241. Oral intake documented at 75-100%.  Goal of Therapy:  INR 2-3 Monitor platelets by anticoagulation protocol: Yes   Plan:  Continue to hold coumadin until VAD plans/timing decided. Daily PT/INR.  Antonietta Jewel, PharmD, [place] Clinical Pharmacist  Phone: [phone] 03/17/2022 9:46 AM  Please check AMION for all [place] phone numbers After 10:00 PM, call [place] [phone]

## 2022-03-17 NOTE — Plan of Care (Signed)
  Problem: Education: Goal: Knowledge of General Education information will improve Description: Including pain rating scale, medication(s)/side effects and non-pharmacologic comfort measures Outcome: Progressing   Problem: Health Behavior/Discharge Planning: Goal: Ability to manage health-related needs will improve Outcome: Progressing   Problem: Activity: Goal: Risk for activity intolerance will decrease Outcome: Progressing   Problem: Skin Integrity: Goal: Risk for impaired skin integrity will decrease Outcome: Progressing   Problem: Education: Goal: Ability to demonstrate management of disease process will improve Outcome: Progressing   Problem: Cardiac: Goal: Ability to achieve and maintain adequate cardiopulmonary perfusion will improve Outcome: Progressing   Problem: Education: Goal: Ability to demonstrate management of disease process will improve Outcome: Progressing

## 2022-03-17 NOTE — H&P (View-Only) (Signed)
  Advanced Heart Failure Rounding Note  PCP-Cardiologist: None   Subjective/Interval Hx   Net -10+L. -3.3l UOP yesterday. Need new weight. Scr stable at 1.8. Co-ox 55%  K 3.4  CVP 9  Breathing better. Denies orthopnea or PND.  Objective:   Weight Range: 86.7 kg Body mass index is 25.22 kg/m.   Vital Signs:   Temp:  [97.6 F (36.4 C)-98.5 F (36.9 C)] 98.1 F (36.7 C) (10/09 0427) Pulse Rate:  [56-58] 56 (10/09 0427) Resp:  [15-20] 16 (10/09 0427) BP: (83-105)/(49-72) 105/68 (10/09 0427) SpO2:  [97 %-98 %] 97 % (10/08 1705) Last BM Date : 03/14/22  Weight change: Filed Weights   03/13/22 0500 03/14/22 0310 03/15/22 0426  Weight: 89.2 kg 88.3 kg 86.7 kg    Intake/Output:   Intake/Output Summary (Last 24 hours) at 03/17/2022 0736 Last data filed at 03/17/2022 0155 Gross per 24 hour  Intake 1140 ml  Output 3300 ml  Net -2160 ml      Physical Exam    General:  well, elderly appearing.  No respiratory difficulty HEENT: normal Neck: supple. JVD 9 cm. Carotids 2+ bilat; no bruits. No lymphadenopathy or thyromegaly appreciated. Cor: PMI nondisplaced. Regular rate & rhythm. No rubs, gallops or murmurs. Lungs: clear Abdomen: soft, nontender, nondistended. No hepatosplenomegaly. No bruits or masses. Good bowel sounds. Extremities: no cyanosis, clubbing, rash, edema. + BLE UNNA Neuro: alert & oriented x 3, cranial nerves grossly intact. moves all 4 extremities w/o difficulty. Affect pleasant.    Telemetry   NSR 60s (Personally reviewed)    Labs    CBC Recent Labs    03/17/22 0552  WBC 5.4  HGB 9.0*  HCT 27.9*  MCV 67.9*  PLT 241   Basic Metabolic Panel Recent Labs    03/16/22 0500 03/17/22 0552  NA 137 134*  K 3.5 3.4*  CL 99 97*  CO2 27 27  GLUCOSE 84 86  BUN 24* 26*  CREATININE 1.83* 1.85*  CALCIUM 9.2 9.1   Liver Function Tests No results for input(s): "AST", "ALT", "ALKPHOS", "BILITOT", "PROT", "ALBUMIN" in the last 72 hours.  BNP  (last 3 results) Recent Labs    01/28/22 0926 02/14/22 1205 03/12/22 1220  BNP 1,033.4* 1,321.3* 1,417.5*     Imaging   - pertinent imaging reviewed  Medications:     Scheduled Medications:  amiodarone  100 mg Oral Daily   aspirin EC  81 mg Oral Daily   atorvastatin  80 mg Oral Daily   Chlorhexidine Gluconate Cloth  6 each Topical Daily   dapagliflozin propanediol  10 mg Oral Daily   digoxin  0.0625 mg Oral Daily   escitalopram  10 mg Oral Daily   ezetimibe  5 mg Oral Daily   furosemide  80 mg Intravenous Q12H   latanoprost  1 drop Both Eyes QHS   midodrine  10 mg Oral TID   pantoprazole  80 mg Oral Q1200   potassium chloride SA  40 mEq Oral Daily   sodium chloride flush  10-40 mL Intracatheter Q12H   sodium chloride flush  3 mL Intravenous Q12H   spironolactone  25 mg Oral Daily   traZODone  50 mg Oral QHS   umeclidinium bromide  1 puff Inhalation Daily    Infusions:  sodium chloride      PRN Medications: sodium chloride, acetaminophen, albuterol, diphenhydrAMINE, nitroGLYCERIN, ondansetron (ZOFRAN) IV, mouth rinse, sodium chloride flush, sodium chloride flush    Patient Profile  Keith Jenkins is a   76 y.o. male with COPD, GERD/dysphagia, CAD, mitral regurgitation s/p CABG/MVR 5/23, and HFrEF.   Admitted with A/C HFrEF.  Assessment/Plan   1. Acute on Chronic systolic CHF /ICM - s/p late presentation inferior STEMI 05/23 - Echo (5/23): EF 25-30%, RV moderately reduced, moderate to severe MR. - S/p CABG + bioprosthetic MVR 11/01/21 - Echo 03/12/22 showed EF 20-25%, severe LV dysfunction with global HK, + large Left pleural effusion, RV moderately down, moderate aortic stenosis, suspect component of low gradient AS. - On admit NYHA IV - Brisk diuresis with IV lasix. Co-ox 55% today - Remains mildly volume overloaded. CVP 9 - IV lasix through today, transition to PO tomorrow. Watch renal function closely - On digoxin 0.0625 mg daily, (dig level 0.9), will  repeat prior to D/C - Continue Farxiga 10 mg daily.  - Continue spiro 25mg  daily - Continue midodrine 10 mg tid. - No beta blocker with decompensation/volume overload. - Will need to consider VAD if he is candidate. Possible RHC today   2. CAD/recent late presentation inferior STEMI - LHC 05/23: 100% m RCA and 90% m LAD.  - RCA managed medically d/t completed infarct.  - Echo (5/23) EF 25%, RV moderately reduced, moderate to severe MR - s/p CABG x 2 (LIMA-LAD, SVG-[place] INT)  (11/01/21) - Continue ASA + high intensity statin.    3. Mitral regurgitation - Moderate to severe ischemic MR on TEE.  - S/p bioprosthetic MVR 11/02/21. - On warfarin per TCTS (3 months post valve). INR followed by [facility].    4. LFLG Aortic stenosis - Moderate on echo this admit.   5. H/o Pericardial effusion/Acute Pericarditis  - Moderate in size on past echo, w/o tamponade. - Acute pericarditis/ adhensions noted at time of CABG. - Resolved on echo.   6. PAF - s/p MAZE w/ CABG  - Maintaining SR.  - On warfarin.  - INR stable 2.3 - Continue amiodarone 100 mg daily.  - Will hold warfarin tonight. D/w PharmD. Until we discuss with VAD team tomorrow to [place] next steps    7. Left pleural effusion - Large on echo this admit.  - reported as small on CXR  - Diurese as above.   8. Esophageal stricture - Also has h/o esophageal stricture (was unable to pass TEE probe transgastric prior to CABG) - Barium swallow with distal stricture.  - Dr. Hilarie Fredrickson planning EGD with dilation. Need to hold off for right now with decompensation.  9. Anemia, iron-deficiency - Received feraheme 10/5. Will likely need additional dosing. D/w PharmD    Length of Stay: [address], AGACNP-BC  03/17/2022, 7:36 AM  Advanced Heart Failure Team Pager [phone] (M-F; 7a - 5p)  Please contact [facility] for night-coverage after hours (5p -7a ) and weekends on [email or web address]  Patient seen and examined with the  above-signed Advanced Practice Provider and/or Housestaff. I personally reviewed laboratory data, imaging studies and relevant notes. I independently examined the patient and formulated the important aspects of the plan. I have edited the note to reflect any of my changes or salient points. I have personally discussed the plan with the patient and/or family.  Feeling better. Denies dyspnea, orthopnea or PND. Has been walking halls with assistance.  General:  Well appearing. No resp difficulty HEENT: normal Neck: supple. JVP 9 Carotids 2+ bilat; no bruits. No lymphadenopathy or thryomegaly appreciated. Cor: PMI nondisplaced. Regular rate & rhythm. No rubs, gallops or murmurs. Lungs: clear Abdomen: soft, nontender, nondistended. No  hepatosplenomegaly. No bruits or masses. Good bowel sounds. Extremities: no cyanosis, clubbing, rash, edema Neuro: alert & orientedx3, cranial nerves grossly intact. moves all 4 extremities w/o difficulty. Affect pleasant  Improving with diuresis. Renal function stable. Will plan RHC today. VAD team to see in consult. Warfarin on hold. INR 2.3. Has gotten feraheme x 2  Glori Bickers, MD  8:22 AM

## 2022-03-17 NOTE — Progress Notes (Signed)
Mobility Specialist Progress Note    03/17/22 1203  Mobility  Activity Ambulated with assistance in hallway  Level of Assistance Contact guard assist, steadying assist  Assistive Device Other (Comment) (IV pole)  Distance Ambulated (ft) 420 ft  Activity Response Tolerated well  Mobility Referral Yes  $Mobility charge 1 Mobility   Pre-Mobility: 56 HR During Mobility: 64 HR Post-Mobility: 63 HR  Pt received in bed and agreeable. Had x1 LOB requiring minA to recover. No complaints on walk. Returned to sitting EOB with call bell in reach.    Hildred Alamin Mobility Specialist

## 2022-03-17 NOTE — Progress Notes (Signed)
Initial Nutrition Assessment  DOCUMENTATION CODES:   Non-severe (moderate) malnutrition in context of chronic illness  INTERVENTION:   - Liberalize diet to Regular to provide maximum food options (pt declines dysphagia 2 or dysphagia 3 diet at this time)  - Magic Cup TID with meals, each supplement provides 290 kcal and 9 grams of protein  - Liquid MVI daily due to swallowing issues  - Diet education provided  NUTRITION DIAGNOSIS:   Moderate Malnutrition related to chronic illness (COPD, CHF, dysphagia) as evidenced by moderate fat depletion, moderate muscle depletion.  GOAL:   Patient will meet greater than or equal to 90% of their needs  MONITOR:   PO intake, Supplement acceptance, Labs, Weight trends, I & O's  REASON FOR ASSESSMENT:   Consult LVAD Eval  ASSESSMENT:   76 year old male with PMH of COPD, GERD, dysphagia, CAD, CHF, mitral regurgitation s/p CABG and MVR May 2023. Pt admitted from [facility] Clinic on 10/04 for IV diuresis with inotrope support as needed.  10/09 - s/p RHC 10/10 - s/p thoracentesis  Spoke with pt, wife, and daughter at bedside. Pt reports appetite has been okay. He states that the food is difficult to eat at times due to lack of teeth (pt does not wear dentures) and baseline swallowing difficulties. Pt with history of dysphagia over many years. Pt's wife shares that pt has a hiatal hernia. Pt's wife has learned over the years how to modify recipes and cook food in a way that he can consume it easily. For example, pt eats a lot of scrambled eggs, low-sodium soups, and ground meats. RD offered to downgrade diet to dysphagia 3 or dysphagia 2 for ease of intake, but pt declines at this time. Will instead liberalize diet to Regular to provide maximum food options at meals and promote PO intake.  Pt reports that he typically eats 2 meals daily at home. Breakfast usually includes scrambled eggs and tomatoes. Pt may occasionally have a piece of toast with  breakfast. Pt eats again around 3:00 pm when he may have home-cooked chicken salad with hard-boiled eggs, low-sodium homemade vegetable soup, or homemade chili with [place].  Pt and family report that during previous admission, pt was consuming multiple oral nutrition supplements like Ensure, Vital Cuisine Shakes, etc. Pt's wife very concerned that consumption of these supplements contributed to issues with pt's INR. Explained that Ensure supplements have a low amount of vitamin K and that, if consumed consistently, are a good way to add extra kcal and protein to pt's diet. Pt and wife do not want to pursue oral nutrition supplements at this time due to past difficulties. Discussed possibility of adding an additional small meal/snack between breakfast and supper. Brainstormed high-calorie, high-protein meal and snack ideas.  Reviewed weight history in chart. Pt and family feel like pt has lost dry weight over time. Overall, pt with a 24.6 kg weight loss since 07/09/21. This is a 23.9% weight loss in less than 1 year which is severe and significant for timeframe. Difficult to determine how much of weight loss may be related to fluid status; however, strongly suspect true dry weight loss is present given pt meets criteria for malnutrition.  Pt reports that he is going to be discharged today after thoracentesis. Per HF team notes, pt is not a suitable candidate for LVAD.  Meal Completion: 40-100%  Admit weight: 89.2 kg Current weight: 86.7 kg  Medications reviewed and include: farxiga, protonix, klor-con 40 mEq daily, spironolactone  Labs reviewed: sodium 132,  potassium 3.4, BUN 29, creatinine 1.97, ionized calcium 1.12, HDL 33  UOP: 1975 ml x 24 hours I/O's: -12.3 L since admit  NUTRITION - FOCUSED PHYSICAL EXAM:  Flowsheet Row Most Recent Value  Orbital Region Moderate depletion  Upper Arm Region Moderate depletion  Thoracic and Lumbar Region Moderate depletion  Buccal Region Moderate  depletion  Temple Region Moderate depletion  Clavicle Bone Region Moderate depletion  Clavicle and Acromion Bone Region Moderate depletion  Scapular Bone Region Moderate depletion  Dorsal Hand Mild depletion  Patellar Region Moderate depletion  Anterior Thigh Region Moderate depletion  Posterior Calf Region Mild depletion  Edema (RD Assessment) Mild  Hair Reviewed  Eyes Reviewed  Mouth Reviewed  Skin Reviewed  Nails Reviewed       Diet Order:   Diet Order             Diet regular Room service appropriate? Yes; Fluid consistency: Thin  Diet effective now           Diet - low sodium heart healthy                   EDUCATION NEEDS:   Education needs have been addressed  Skin:  Skin Assessment: Reviewed RN Assessment  Last BM:  03/14/22  Height:   Ht Readings from Last 1 Encounters:  03/13/22 6\' 1"  (1.854 m)    Weight:   Wt Readings from Last 1 Encounters:  03/18/22 78.4 kg    BMI:  Body mass index is 22.8 kg/m.  Estimated Nutritional Needs:   Kcal:  2100-2300  Protein:  105-120 grams  Fluid:  1.8 L/day    Gustavus Bryant, MS, RD, LDN Inpatient Clinical Dietitian Please see AMiON for contact information.

## 2022-03-17 NOTE — Progress Notes (Signed)
MCS EDUCATION NOTE:                VAD evaluation consent reviewed and signed by patient  and designated caregiver wife Nazareth Oliva. Initial VAD teaching completed with pt and caregiver.   VAD educational packet including "Understanding Your Options with Advanced Heart Failure", "[place] Patient Agreement for VAD Evaluation and Potential Implantation" consent, and [employer] "Heartmate 3 Left Ventricular Device (LVAD) Patient Guide", Heartmate 3 Left Ventricular Assist System Patient Education Program DVD", "[place] HM III Patient Education", "[place] Mechanical Circulatory Support Program", and "Decision Aids for Left Ventricular Assist Device" reviewed in detail and left at bedside for continued reference.   All questions answered regarding VAD implant, hospital stay, and what to expect when discharged home living with a heart pump. Pt identified wife as his primary caregiver and daughter as back-up if this therapy should be deemed appropriate. Explained need for 24/7 care when pt is discharged home due to sternal precautions, adaptation to living on support, emotional support, consistent and meticulous exit site care and management, medication adherence and high volume of follow up visits with the [facility] after discharge; both pt and caregiver verbalized understanding of above.   Explained that LVAD can be implanted for two indications in the setting of advanced left ventricular heart failure treatment:  Bridge to transplant - used for patients who cannot safely wait for heart transplant without this device.  Or    Destination therapy - used for patients until end of life or recovery of heart function.  Patient and caregiver(s) acknowledge that the indication at this point in time for LVAD therapy would be for destination therapy due to age.   Reviewed and supplied a copy of home inspection check list stressing that only three pronged grounded power outlets can be used for VAD equipment.  Patient confirmed home has electrical outlets that will support the equipment along with access working telephone.  Identified the following lifestyle modifications while living on MCS:    1. No driving for at least three months and then only if doctor gives permission to do so.   2. No tub baths while pump implanted, and shower only when doctor gives permission.   3. No swimming or submersion in water while implanted with pump.   4. No contact sports or engaging in jumping activities.   5. Always have a backup controller, charged spare batteries, and battery clips nearby at all times in case of emergency.   6. Call the doctor or hospital contact person if any change in how the pump sounds, feels, or works.   7. Plan to sleep only when connected to the power module.   8. Do not sleep on your stomach.   9. Keep a backup system controller, charged batteries, battery clips, and flashlight near you during sleep in case of electrical power outage.   10. Exit site care including dressing changes, monitoring for infection, and importance of keeping percutaneous lead stabilized at all times.   One of our current patients and his wife visited with them today to answer questions about living on and caring for someone on MCS; Extended the option to have one of our current patients and caregiver(s) come to talk with them about living on support to assist with decision making.   Reviewed pictures of VAD drive line, site care, dressing changes, and drive line stabilization including securement attachment device and abdominal binder. Discussed with pt and family that they will be required  to purchase dressing supplies as long as patient has the VAD in place.   Reinforced need for 24 hour/7 day week caregivers; pt designated Sissy Hoff and [employer] as caregivers. He will also need to abide by sternal precautions with no lifting >10lbs, pushing, pulling and will need assistance with adapting to new life style  with VAD equipment and care.   Intermacs patient survival statistics through March 2023 reviewed with patient and caregiver as follows:                                              The patient understands that from this discussion it does not mean that they will receive the device, but that depends on an extensive evaluation process. The patient is aware of the fact that if at anytime they want to stop the evaluation process they can.  All questions have been answered at this time and contact information was provided should they encounter any further questions.  They are both agreeable at this time to the evaluation process and will move forward. Will plan to show equipment tomorrow to patient and family at 76.   Bobbye Morton, RN,BSN [place] Coordinator   Office: [phone] 24/7 [place] Pager: [phone]

## 2022-03-18 ENCOUNTER — Ambulatory Visit: Payer: Medicare HMO

## 2022-03-18 ENCOUNTER — Other Ambulatory Visit (HOSPITAL_COMMUNITY): Payer: Self-pay

## 2022-03-18 ENCOUNTER — Encounter (HOSPITAL_COMMUNITY): Payer: Self-pay | Admitting: Internal Medicine

## 2022-03-18 ENCOUNTER — Inpatient Hospital Stay (HOSPITAL_COMMUNITY): Payer: Medicare HMO

## 2022-03-18 ENCOUNTER — Encounter (HOSPITAL_COMMUNITY)
Admission: RE | Disposition: A | Discharge status: Home / Self Care | Payer: Self-pay | Source: Home / Self Care | Attending: Internal Medicine

## 2022-03-18 ENCOUNTER — Encounter (HOSPITAL_COMMUNITY): Payer: Medicare HMO

## 2022-03-18 ENCOUNTER — Other Ambulatory Visit (HOSPITAL_COMMUNITY): Payer: Medicare HMO

## 2022-03-18 DIAGNOSIS — Z515 Encounter for palliative care: Secondary | ICD-10-CM | POA: Diagnosis not present

## 2022-03-18 DIAGNOSIS — I5021 Acute systolic (congestive) heart failure: Secondary | ICD-10-CM | POA: Diagnosis not present

## 2022-03-18 DIAGNOSIS — J811 Chronic pulmonary edema: Secondary | ICD-10-CM | POA: Diagnosis not present

## 2022-03-18 DIAGNOSIS — K828 Other specified diseases of gallbladder: Secondary | ICD-10-CM | POA: Diagnosis not present

## 2022-03-18 DIAGNOSIS — J90 Pleural effusion, not elsewhere classified: Secondary | ICD-10-CM | POA: Diagnosis not present

## 2022-03-18 DIAGNOSIS — Z7189 Other specified counseling: Secondary | ICD-10-CM

## 2022-03-18 DIAGNOSIS — J948 Other specified pleural conditions: Secondary | ICD-10-CM | POA: Diagnosis not present

## 2022-03-18 DIAGNOSIS — J449 Chronic obstructive pulmonary disease, unspecified: Secondary | ICD-10-CM | POA: Diagnosis not present

## 2022-03-18 DIAGNOSIS — R918 Other nonspecific abnormal finding of lung field: Secondary | ICD-10-CM | POA: Diagnosis not present

## 2022-03-18 DIAGNOSIS — R531 Weakness: Secondary | ICD-10-CM | POA: Diagnosis not present

## 2022-03-18 DIAGNOSIS — I5023 Acute on chronic systolic (congestive) heart failure: Secondary | ICD-10-CM | POA: Diagnosis not present

## 2022-03-18 HISTORY — PX: THORACENTESIS: SHX235

## 2022-03-18 LAB — BODY FLUID CELL COUNT WITH DIFFERENTIAL
Eos, Fluid: 0 %
Lymphs, Fluid: 16 %
Monocyte-Macrophage-Serous Fluid: 80 % (ref 50–90)
Neutrophil Count, Fluid: 4 % (ref 0–25)
Total Nucleated Cell Count, Fluid: 358 uL (ref 0–1000)

## 2022-03-18 LAB — BASIC METABOLIC PANEL
Anion gap: 10 (ref 5–15)
BUN: 29 mg/dL — ABNORMAL HIGH (ref 8–23)
CO2: 27 mmol/L (ref 22–32)
Calcium: 8.9 mg/dL (ref 8.9–10.3)
Chloride: 95 mmol/L — ABNORMAL LOW (ref 98–111)
Creatinine, Ser: 1.97 mg/dL — ABNORMAL HIGH (ref 0.61–1.24)
GFR, Estimated: 35 mL/min — ABNORMAL LOW (ref >60)
Glucose, Bld: 101 mg/dL — ABNORMAL HIGH (ref 70–99)
Potassium: 3.4 mmol/L — ABNORMAL LOW (ref 3.5–5.1)
Sodium: 132 mmol/L — ABNORMAL LOW (ref 135–145)

## 2022-03-18 LAB — URINALYSIS, ROUTINE W REFLEX MICROSCOPIC
Bilirubin Urine: NEGATIVE
Glucose, UA: 50 mg/dL — AB
Hgb urine dipstick: NEGATIVE
Ketones, ur: NEGATIVE mg/dL
Leukocytes,Ua: NEGATIVE
Nitrite: NEGATIVE
Protein, ur: NEGATIVE mg/dL
Specific Gravity, Urine: 1.009 (ref 1.005–1.030)
pH: 7 (ref 5.0–8.0)

## 2022-03-18 LAB — GRAM STAIN

## 2022-03-18 LAB — ANTITHROMBIN III: AntiThromb III Func: 77 % (ref 75–120)

## 2022-03-18 LAB — COOXEMETRY PANEL
Carboxyhemoglobin: 1.9 % — ABNORMAL HIGH (ref 0.5–1.5)
Methemoglobin: <0.7 % (ref 0.0–1.5)
O2 Saturation: 63.6 %
Total hemoglobin: 10 g/dL — ABNORMAL LOW (ref 12.0–16.0)

## 2022-03-18 LAB — GLUCOSE, PLEURAL OR PERITONEAL FLUID: Glucose, Fluid: 123 mg/dL

## 2022-03-18 LAB — LIPID PANEL
Cholesterol: 75 mg/dL (ref 0–200)
HDL: 33 mg/dL — ABNORMAL LOW (ref >40)
LDL Cholesterol: 29 mg/dL (ref 0–99)
Total CHOL/HDL Ratio: 2.3 RATIO
Triglycerides: 63 mg/dL (ref <150)
VLDL: 13 mg/dL (ref 0–40)

## 2022-03-18 LAB — LACTATE DEHYDROGENASE, PLEURAL OR PERITONEAL FLUID: LD, Fluid: 79 U/L — ABNORMAL HIGH (ref 3–23)

## 2022-03-18 LAB — HEPATIC FUNCTION PANEL
ALT: 27 U/L (ref 0–44)
AST: 33 U/L (ref 15–41)
Albumin: 3.3 g/dL — ABNORMAL LOW (ref 3.5–5.0)
Alkaline Phosphatase: 89 U/L (ref 38–126)
Bilirubin, Direct: 0.2 mg/dL (ref 0.0–0.2)
Indirect Bilirubin: 1 mg/dL — ABNORMAL HIGH (ref 0.3–0.9)
Total Bilirubin: 1.2 mg/dL (ref 0.3–1.2)
Total Protein: 6.9 g/dL (ref 6.5–8.1)

## 2022-03-18 LAB — HEPATITIS B CORE ANTIBODY, TOTAL: Hep B Core Total Ab: NONREACTIVE

## 2022-03-18 LAB — LACTATE DEHYDROGENASE: LDH: 193 U/L — ABNORMAL HIGH (ref 98–192)

## 2022-03-18 LAB — HIV ANTIBODY (ROUTINE TESTING W REFLEX): HIV Screen 4th Generation wRfx: NONREACTIVE

## 2022-03-18 LAB — PROTEIN, PLEURAL OR PERITONEAL FLUID: Total protein, fluid: <3 g/dL

## 2022-03-18 LAB — PROTIME-INR
INR: 1.9 — ABNORMAL HIGH (ref 0.8–1.2)
Prothrombin Time: 21.6 seconds — ABNORMAL HIGH (ref 11.4–15.2)

## 2022-03-18 LAB — URIC ACID: Uric Acid, Serum: 10.5 mg/dL — ABNORMAL HIGH (ref 3.7–8.6)

## 2022-03-18 LAB — TSH: TSH: 1.049 u[IU]/mL (ref 0.350–4.500)

## 2022-03-18 LAB — T4, FREE: Free T4: 1.24 ng/dL — ABNORMAL HIGH (ref 0.61–1.12)

## 2022-03-18 LAB — HEPATITIS B SURFACE ANTIGEN: Hepatitis B Surface Ag: NONREACTIVE

## 2022-03-18 LAB — PREALBUMIN: Prealbumin: 15 mg/dL — ABNORMAL LOW (ref 18–38)

## 2022-03-18 LAB — APTT: aPTT: 40 seconds — ABNORMAL HIGH (ref 24–36)

## 2022-03-18 SURGERY — THORACENTESIS
Anesthesia: LOCAL | Laterality: Bilateral

## 2022-03-18 MED ORDER — SPIRONOLACTONE 25 MG PO TABS
25.0000 mg | ORAL_TABLET | Freq: Every day | ORAL | 3 refills | Status: DC
  Filled 2022-03-18: qty 60, 60d supply, fill #0

## 2022-03-18 MED ORDER — TORSEMIDE 20 MG PO TABS
60.0000 mg | ORAL_TABLET | Freq: Every day | ORAL | 3 refills | Status: DC
  Filled 2022-03-18: qty 60, 20d supply, fill #0

## 2022-03-18 MED ORDER — POTASSIUM CHLORIDE CRYS ER 20 MEQ PO TBCR
20.0000 meq | EXTENDED_RELEASE_TABLET | Freq: Once | ORAL | Status: AC
  Administered 2022-03-18: 20 meq via ORAL
  Filled 2022-03-18: qty 1

## 2022-03-18 MED ORDER — WARFARIN - PHARMACIST DOSING INPATIENT: Freq: Every day | Status: DC

## 2022-03-18 MED ORDER — WARFARIN SODIUM 1 MG PO TABS
ORAL_TABLET | ORAL | 5 refills | Status: DC
  Filled 2022-03-18: qty 16, 7d supply, fill #0

## 2022-03-18 MED ORDER — WARFARIN SODIUM 1 MG PO TABS
ORAL_TABLET | ORAL | 5 refills | Status: DC
  Filled 2022-03-18: qty 90, fill #0

## 2022-03-18 MED ORDER — WARFARIN SODIUM 3 MG PO TABS
3.0000 mg | ORAL_TABLET | Freq: Once | ORAL | Status: AC
  Administered 2022-03-18: 3 mg via ORAL
  Filled 2022-03-18: qty 1

## 2022-03-18 MED ORDER — ALBUTEROL SULFATE (2.5 MG/3ML) 0.083% IN NEBU
3.0000 mL | INHALATION_SOLUTION | Freq: Four times a day (QID) | RESPIRATORY_TRACT | 12 refills | Status: DC | PRN
  Filled 2022-03-18: qty 90, 8d supply, fill #0

## 2022-03-18 MED ORDER — ADULT MULTIVITAMIN LIQUID CH: 15.0000 mL | Freq: Every day | ORAL | Status: DC

## 2022-03-18 NOTE — Procedures (Signed)
Thoracentesis  Procedure Note  Adalid Beckmann  [id number]  [birth year]  Date:03/18/22  Time:3:03 PM   Provider Performing:Theador Jezewski C Dora Simeone   Procedure: Thoracentesis with imaging guidance (83338)  Indication(s) Pleural Effusion  Consent Risks of the procedure as well as the alternatives and risks of each were explained to the patient and/or caregiver.  Consent for the procedure was obtained and is signed in the bedside chart  Anesthesia Topical only with 1% lidocaine    Time Out Verified patient identification, verified procedure, site/side was marked, verified correct patient position, special equipment/implants available, medications/allergies/relevant history reviewed, required imaging and test results available.   Sterile Technique Maximal sterile technique including full sterile barrier drape, hand hygiene, sterile gown, sterile gloves, mask, hair covering, sterile ultrasound probe cover (if used).  Procedure Description Ultrasound was used to identify appropriate pleural anatomy for placement and overlying skin marked.  Area of drainage cleaned and draped in sterile fashion. Lidocaine was used to anesthetize the skin and subcutaneous tissue.  500 cc's of amber appearing fluid was drained from the right pleural space. Catheter then removed and bandaid applied to site.   Complications/Tolerance None; patient tolerated the procedure well. Chest X-ray is ordered to confirm no post-procedural complication.   EBL Minimal   Specimen(s) Pleural fluid

## 2022-03-18 NOTE — Plan of Care (Signed)

## 2022-03-18 NOTE — Progress Notes (Addendum)
[facility] for Warfarin Indication: atrial fibrillation  No Known Allergies  Vital Signs: Temp: 97.7 F (36.5 C) (10/10 0825) Temp Source: Oral (10/10 0825) BP: 105/61 (10/10 0915) Pulse Rate: 63 (10/10 0915)  Labs: Recent Labs    03/16/22 0500 03/17/22 0552 03/17/22 0552 03/17/22 1520 03/17/22 1521 03/17/22 1525 03/18/22 0558  HGB  --  9.0*   < > 10.5* 9.5* 10.2*  --   HCT  --  27.9*   < > 31.0* 28.0* 30.0*  --   PLT  --  241  --   --   --   --   --   APTT  --   --   --   --   --   --  40*  LABPROT 24.5* 25.1*  --   --   --   --  21.6*  INR 2.2* 2.3*  --   --   --   --  1.9*  CREATININE 1.83* 1.85*  --   --   --   --  1.97*   < > = values in this interval not displayed.     Estimated Creatinine Clearance: 37.2 mL/min (A) (by C-G formula based on SCr of 1.97 mg/dL (H)).  Assessment: 40 yoM on warfarin PTA for hx AF and recent bMVR.  **Home dose of warfarin 3 mg M and F, 2 mg other days  INR is subtherapeutic at 1.9 - warfarin held for 2 nights while undergoing VAD workup. Hgb 9, plt 241- stable on last check 10/9. Oral intake documented at 100% one meal yesterday. Discussed with Dr Haroldine Laws, okay to restart warfarin tonight.  Goal of Therapy:  INR 2-3 Monitor platelets by anticoagulation protocol: Yes   Plan:  Warfarin 3 mg tonight (1.5x normal PTA dose for Tuesday) before discharge  Daily PT/INR. At discharge can resume PTA regimen starting tomorrow  Antonietta Jewel, PharmD, [place] Pharmacist  Phone: [phone] 03/18/2022 9:56 AM  Please check AMION for all [place] phone numbers After 10:00 PM, call [place] [phone]

## 2022-03-18 NOTE — Progress Notes (Signed)
Mobility Specialist Progress Note    03/18/22 1432  Mobility  Activity Off unit   Pt at procedure. Will f/u as schedule permits.   Hildred Alamin Mobility Specialist

## 2022-03-18 NOTE — Progress Notes (Addendum)
Advanced Heart Failure Rounding Note  PCP-Cardiologist: None   Subjective/Interval Hx   Net -12+L. Need new weight. Scr Cr slightly bumped 1.8-1.97. Co-ox pending today.  K 3.4  CVP 10-11  RHC yesterday:  RA = 8 RV = 45/10 PA = 48/16 (28) PCW = 22 (v = 30)  Fick cardiac output/index = 5.7/2.7 Thermo CO/CI = 4.7/2.2 PVR = 0.8 WU Ao sat = 94% PA sat = 54%, 57% PAPI = 4.0 1. Elevated filling pressures with moderately decreased cardiac output (suspect thermodilution numbers are more accurate)  Ambulated down halls yesterday. Breathing better. Denies orthopnea, CP or PND.  Objective:   Weight Range: 86.7 kg Body mass index is 25.22 kg/m.   Vital Signs:   Temp:  [97.8 F (36.6 C)-98.2 F (36.8 C)] 97.8 F (36.6 C) (10/09 2327) Pulse Rate:  [57-111] 59 (10/09 2327) Resp:  [12-30] 19 (10/09 2327) BP: (92-169)/(55-142) 104/63 (10/09 2327) SpO2:  [90 %-98 %] 97 % (10/10 0726) Last BM Date : 03/14/22  Weight change: Filed Weights   03/13/22 0500 03/14/22 0310 03/15/22 0426  Weight: 89.2 kg 88.3 kg 86.7 kg    Intake/Output:   Intake/Output Summary (Last 24 hours) at 03/18/2022 0752 Last data filed at 03/18/2022 0500 Gross per 24 hour  Intake 300 ml  Output 1975 ml  Net -1675 ml      Physical Exam  CVP 10-11 General:  well, elderly appearing.  No respiratory difficulty HEENT: normal Neck: supple. JVD 10 cm. Carotids 2+ bilat; no bruits. No lymphadenopathy or thyromegaly appreciated. Cor: PMI nondisplaced. Regular rate & rhythm. No rubs, gallops or murmurs. Lungs: clear Abdomen: soft, nontender, nondistended. No hepatosplenomegaly. No bruits or masses. Good bowel sounds. Extremities: no cyanosis, clubbing, rash, edema. + BLE UNNA. PICC RUE Neuro: alert & oriented x 3, cranial nerves grossly intact. moves all 4 extremities w/o difficulty. Affect pleasant.   Telemetry   NSR 60s (Personally reviewed)    Labs    CBC Recent Labs    03/17/22 0552  03/17/22 1520 03/17/22 1521 03/17/22 1525  WBC 5.4  --   --   --   HGB 9.0*   < > 9.5* 10.2*  HCT 27.9*   < > 28.0* 30.0*  MCV 67.9*  --   --   --   PLT 241  --   --   --    < > = values in this interval not displayed.   Basic Metabolic Panel Recent Labs    00/86/76 0552 03/17/22 1520 03/17/22 1525 03/18/22 0558  NA 134*   < > 137 132*  K 3.4*   < > 3.6 3.4*  CL 97*  --   --  95*  CO2 27  --   --  27  GLUCOSE 86  --   --  101*  BUN 26*  --   --  29*  CREATININE 1.85*  --   --  1.97*  CALCIUM 9.1  --   --  8.9  MG 2.3  --   --   --    < > = values in this interval not displayed.   Liver Function Tests No results for input(s): "AST", "ALT", "ALKPHOS", "BILITOT", "PROT", "ALBUMIN" in the last 72 hours.  BNP (last 3 results) Recent Labs    01/28/22 0926 02/14/22 1205 03/12/22 1220  BNP 1,033.4* 1,321.3* 1,417.5*     Imaging   - pertinent imaging reviewed  Medications:     Scheduled Medications:  amiodarone  100 mg Oral Daily   aspirin EC  81 mg Oral Daily   atorvastatin  80 mg Oral Daily   Chlorhexidine Gluconate Cloth  6 each Topical Daily   dapagliflozin propanediol  10 mg Oral Daily   digoxin  0.0625 mg Oral Daily   escitalopram  10 mg Oral Daily   ezetimibe  5 mg Oral Daily   furosemide  80 mg Intravenous Q12H   latanoprost  1 drop Both Eyes QHS   midodrine  10 mg Oral TID   pantoprazole  80 mg Oral Q1200   potassium chloride SA  40 mEq Oral Daily   sodium chloride flush  10-40 mL Intracatheter Q12H   sodium chloride flush  3 mL Intravenous Q12H   sodium chloride flush  3 mL Intravenous Q12H   spironolactone  25 mg Oral Daily   traZODone  50 mg Oral QHS   umeclidinium bromide  1 puff Inhalation Daily    Infusions:  sodium chloride      PRN Medications: sodium chloride, acetaminophen, albuterol, diphenhydrAMINE, nitroGLYCERIN, ondansetron (ZOFRAN) IV, mouth rinse, sodium chloride flush, sodium chloride flush    Patient Profile  Emitt Maglione is a 76 y.o. male with COPD, GERD/dysphagia, CAD, mitral regurgitation s/p CABG/MVR 5/23, and HFrEF.   Admitted with A/C HFrEF.  Assessment/Plan   1. Acute on Chronic systolic CHF /ICM - s/p late presentation inferior STEMI 05/23 - Echo (5/23): EF 25-30%, RV moderately reduced, moderate to severe MR. - S/p CABG + bioprosthetic MVR 11/01/21 - Echo 03/12/22 showed EF 20-25%, severe LV dysfunction with global HK, + large Left pleural effusion, RV moderately down, moderate aortic stenosis, suspect component of low gradient AS. - On admit NYHA IV - Co-ox pending today - Remains mildly volume overloaded. CVP 10-11 - Previously diuresed with IV lasix, received last dose this morning. Start 40mg  PO torsemide daily tomorrow.   - On digoxin 0.0625 mg daily, (dig level 0.9), will repeat prior to D/C - Continue Farxiga 10 mg daily.  - Continue spiro 25mg  daily - Continue midodrine 10 mg tid. - No beta blocker with decompensation/volume overload. - RHC yesterday: Elevated filling pressures with moderately decreased cardiac output. VAD team saw patient and provided education. Work-up for possible VAD ongoing, TCTS to see today. CTA/P pending.   2. CAD/recent late presentation inferior STEMI - LHC 05/23: 100% m RCA and 90% m LAD.  - RCA managed medically d/t completed infarct.  - Echo (5/23) EF 25%, RV moderately reduced, moderate to severe MR - s/p CABG x 2 (LIMA-LAD, SVG-Ramus INT)  (11/01/21) - Continue ASA + high intensity statin.    3. Mitral regurgitation - Moderate to severe ischemic MR on TEE.  - S/p bioprosthetic MVR 11/02/21. - On warfarin per TCTS (3 months post valve). INR followed by [facility].    4. LFLG Aortic stenosis - Moderate on echo this admit.   5. H/o Pericardial effusion/Acute Pericarditis  - Moderate in size on past echo, w/o tamponade. - Acute pericarditis/ adhensions noted at time of CABG. - Resolved on echo.   6. PAF - s/p MAZE w/ CABG  -  Maintaining SR.  - On warfarin.  - INR stable 1.9, Warfarin on hold. Consider heparin if further drop and plan for VAD - Continue amiodarone 100 mg daily.  - Continue to hold warfarin. D/w PharmD. VAD team saw patient yesterday   7. Left pleural effusion - Large on echo this admit.  - reported as small  on CXR  - Diurese as above.   8. Esophageal stricture - Also has h/o esophageal stricture (was unable to pass TEE probe transgastric prior to CABG) - Barium swallow with distal stricture.  - Dr. Rhea Belton planning EGD with dilation. Need to hold off for right now with decompensation.  9. Anemia, iron-deficiency - Received feraheme x2 now.    Length of Stay: [address] AGACNP-BC  03/18/2022, 7:52 AM  Advanced Heart Failure Team Pager [phone] (M-F; 7a - 5p)  Please contact [facility] for night-coverage after hours (5p -7a ) and weekends on [email or web address]  Patient seen and examined with the above-signed Advanced Practice Provider and/or Housestaff. I personally reviewed laboratory data, imaging studies and relevant notes. I independently examined the patient and formulated the important aspects of the plan. I have edited the note to reflect any of my changes or salient points. I have personally discussed the plan with the patient and/or family.  Feels much better after significant diuresis. RHC numbers reviewed with him and family at length. Currently outputs are slightly higher than cutoff for VAD despite severe LV dysfunction.   Scr up slightly with diuresis.   Chest CT with bilateral pleural effusion R>L  He is now adamant that he does not want to pursue VAD  General:  Sitting up  No resp difficulty HEENT: normal Neck: supple. no JVD. Carotids 2+ bilat; no bruits. No lymphadenopathy or thryomegaly appreciated. Cor: PMI nondisplaced. Regular rate & rhythm. 2/6 AS Lungs: decreased both bases Abdomen: soft, nontender, nondistended. No hepatosplenomegaly. No bruits or masses.  Good bowel sounds. Extremities: no cyanosis, clubbing, rash, edema Neuro: alert & orientedx3, cranial nerves grossly intact. moves all 4 extremities w/o difficulty. Affect pleasant  I have reviewed the case with Dr. Laneta Simmers in TCTS and based on his comorbidities and protracted post-op course following recent CABG/MVR, we both feel that he is not suitable candidate for VAD.   Will focus on aggressive medical therapy with tight volume control. (Potentially candidate for ADI volume management trial?). May also have to think about TAVR at some point if he is a candidate.  Will review chest CT with CCM to see if right pleural effusion is tappable prior to d/c.   Resume warfarin.   Arvilla Meres, MD  11:39 AM

## 2022-03-18 NOTE — Discharge Summary (Addendum)
Advanced Heart Failure Team  Discharge Summary   Patient ID: Keith Jenkins MRN: [id number], DOB/AGE: 09-29-1945 76 y.o. Admit date: 03/12/2022 D/C date:     03/18/2022   Primary Discharge Diagnoses:  Acute on Chronic systolic CHF /ICM  Secondary Discharge Diagnoses:  Mitral regurgitation s/p bioprosthetic MVR 5/23 CAD/recent late presenting inferior STEMI s/p CABG 5/23 LFLG aortic stenosis H/o pericardial effusion/ acute pericarditis PAF Left pleural effusion s/p thoracentesis Esophageal stricture Anemia, iron-deficiency 9.   Moderate protein-calorie malnutrition  Hospital Course:  Keith Jenkins is a 76 y.o. male with COPD, GERD/dysphagia, CAD, chronic systolic HF (last echo 5/23 EF 30-35%) and mitral regurgitation s/p CABG/MVR 5/23.    Pt direct admitted from clinic with weakness, multiple falls, volume overlead, and overall feeling poor. Echo 03/12/22 EF drop 20-25%, severe LV dysfunction with global HK, + large Left pleural effusion, RV moderately down, moderate aortic stenosis, suspect component of low gradient AS, dimensionless index = 0.31. ReDS clip 47%, volume overloaded. Admitted for IV diuresis and VAD workup.   Diuresed with IV lasix. Underwent RHC and CTA/P as workup for VAD. Refused further preVAD workup. RHC w/ elevated filling pressures with moderately decreased cardiac output, Fick CO/CI 5.7/2.7 Thermo CO/CI 4.7/2.2. Case reviewed by Dr. Gala Romney & Dr. Laneta Simmers in TCTS and based on his comorbidities and protracted post-op course following recent CABG/MVR, both feel that he is not suitable candidate for VAD. Also, pt adamantly does not want these advanced therapies.    Will focus on aggressive medical therapy with tight volume control. (Potentially candidate for ADI volume management trial?). May also have to think about TAVR at some point if he is a candidate.   Chest CT w/ moderate bilateral pleural effusions, CCM tapped at bedside prior to d/c.   Meds delivered from [facility].  Warfarin given prior to d/c. PICC removed. F/u and lab appt scheduled. Pt will continue to be followed closely in the HF clinic. Dr Gala Romney evaluated and deemed appropriate for discharge.   See below for detailed problem list: 1. Acute on Chronic systolic CHF /ICM - s/p late presentation inferior STEMI 05/23 - Echo (5/23): EF 25-30%, RV moderately reduced, moderate to severe MR. - S/p CABG + bioprosthetic MVR 11/01/21 - Echo 03/12/22 showed EF 20-25%, severe LV dysfunction with global HK, + large Left pleural effusion, RV moderately down, moderate aortic stenosis, suspect component of low gradient AS. - On admit NYHA IV, remains mildly volume overloaded. CVP 10-11 - Previously diuresed with IV lasix, received last dose this morning. Start 60mg  PO torsemide daily tomorrow.   - On digoxin 0.0625 mg daily, (dig level 0.9), will repeat prior to D/C - Continue Farxiga 10 mg daily.  - Continue spiro 25mg  daily - Continue midodrine 10 mg tid. - No beta blocker with decompensation/volume overload. - RHC yesterday: Elevated filling pressures with moderately decreased cardiac output. VAD team saw patient and provided education.  - Ultimately patient refuses advanced therapies for now. Felt a not suitable candidate for VAD. For now, will focus on aggressive medical therapy with tight volume control 2. CAD/recent late presentation inferior STEMI - LHC 05/23: 100% m RCA and 90% m LAD.  - RCA managed medically d/t completed infarct.  - Echo (5/23) EF 25%, RV moderately reduced, moderate to severe MR - s/p CABG x 2 (LIMA-LAD, SVG-Ramus INT)  (11/01/21) - Continue ASA + high intensity statin.  3. Mitral regurgitation - Moderate to severe ischemic MR on TEE.  - S/p bioprosthetic MVR 11/02/21. - On warfarin per  TCTS (3 months post valve). INR followed by [facility].  4. LFLG Aortic stenosis - Moderate on echo this admit. 5. H/o Pericardial effusion/Acute Pericarditis  - Moderate in size on  past echo, w/o tamponade. - Acute pericarditis/ adhensions noted at time of CABG. - Resolved on echo. 6. PAF - s/p MAZE w/ CABG  - Maintaining SR.  - On warfarin.  - INR stable 1.9, Warfarin previously on hold, restarted today. - Continue amiodarone 100 mg daily.  7. Left pleural effusion s/p thoracentesis 10/10 - Large on echo this admit.  - reported as small on CXR  - CCM tapped prior to d/c, -520mL R, -616mL L 8. Esophageal stricture - Also has h/o esophageal stricture (was unable to pass TEE probe transgastric prior to CABG) - Barium swallow with distal stricture.  - Dr. Hilarie Fredrickson planning EGD with dilation. Need to hold off for right now with decompensation. 9. Anemia, iron-deficiency - Received feraheme x2 now.     Discharge Weight Range: 78.4kg Discharge Vitals: Blood pressure 100/60, pulse 60, temperature 98.2 F (36.8 C), temperature source Oral, resp. rate 19, height 6\' 1"  (1.854 m), weight 78.4 kg, SpO2 96 %.  Labs: Lab Results  Component Value Date   WBC 5.4 03/17/2022   HGB 10.2 (L) 03/17/2022   HCT 30.0 (L) 03/17/2022   MCV 67.9 (L) 03/17/2022   PLT 241 03/17/2022    Recent Labs  Lab 03/18/22 0556 03/18/22 0558  NA  --  132*  K  --  3.4*  CL  --  95*  CO2  --  27  BUN  --  29*  CREATININE  --  1.97*  CALCIUM  --  8.9  PROT 6.9  --   BILITOT 1.2  --   ALKPHOS 89  --   ALT 27  --   AST 33  --   GLUCOSE  --  101*   Lab Results  Component Value Date   CHOL 75 03/18/2022   HDL 33 (L) 03/18/2022   LDLCALC 29 03/18/2022   TRIG 63 03/18/2022   BNP (last 3 results) Recent Labs    01/28/22 0926 02/14/22 1205 03/12/22 1220  BNP 1,033.4* 1,321.3* 1,417.5*    ProBNP (last 3 results) No results for input(s): "PROBNP" in the last 8760 hours.   Diagnostic Studies/Procedures   DG Chest Port 1 View  Result Date: 03/18/2022 CLINICAL DATA:  [id number] History of thoracentesis [id number] EXAM: PORTABLE CHEST 1 VIEW COMPARISON:  CT angiography chest 10/23/21  chest, chest x-ray 02/12/2022 01/28/2022 FINDINGS: Right PICC with tip overlying the expected region of the distal superior vena cava. Enlarged cardiac silhouette. The heart and mediastinal contours are within normal limits. Status post aortic valve replacement. Atrial appendage clip. Slightly more conspicuous bilateral upper lobe airspace opacities. Increased interstitial markings. Trace bilateral effusions. No pneumothorax. No acute osseous abnormality.  Sternotomy wires are intact. IMPRESSION: 1. Slightly more conspicuous bilateral upper lobe airspace opacities. Query superimposed infection. 2. Mild pulmonary edema. 3. Persistent bilateral trace pleural effusions. Electronically Signed   By: Iven Finn M.D.   On: 03/18/2022 15:35   CT CHEST ABDOMEN PELVIS WO CONTRAST  Result Date: 03/18/2022 CLINICAL DATA:  VAD workup EXAM: CT CHEST, ABDOMEN AND PELVIS WITHOUT CONTRAST TECHNIQUE: Multidetector CT imaging of the chest, abdomen and pelvis was performed following the standard protocol without IV contrast. RADIATION DOSE REDUCTION: This exam was performed according to the departmental dose-optimization program which includes automated exposure control, adjustment of the mA and/or kV  according to patient size and/or use of iterative reconstruction technique. COMPARISON:  Previous studies including the CT done on 22-Nov-2021 and chest radiograph done on 03/13/2022 FINDINGS: CT CHEST FINDINGS Cardiovascular: Coronary artery calcifications are seen. Prosthetic mitral valve is noted. There is a metallic clamp in the region of left atrial appendage. There is ectasia of ascending thoracic aorta measuring 4.3 cm. Metallic sutures are seen in sternum. Mediastinum/Nodes: Subcentimeter nodes in the mediastinum appear stable. Lungs/Pleura: Moderate bilateral pleural effusions are seen, more so on the left side. New small patchy infiltrates are seen in right upper lobe. There are linear densities in both lower lung  fields. There is no pneumothorax. Musculoskeletal: No acute findings are seen. CT ABDOMEN PELVIS FINDINGS Hepatobiliary: No focal abnormality is seen in liver. There is no dilation of bile ducts. There is subtle increased density in the lumen of gallbladder suggesting presence of sludge and possibly tiny stones. Pancreas: There is fatty infiltration.  No focal abnormalities. Spleen: Unremarkable. Adrenals/Urinary Tract: There is 1.8 cm nodule in left adrenal with no significant interval change. Density measurements are less than 17 Hounsfield units. This nodule has not changed significantly in comparison with previous studies including the examination of 03/28/2020. There is no hydronephrosis. There are no renal or ureteral stones. Urinary bladder is distended. There is mild wall thickening in the bladder. Stomach/Bowel: Small hiatal hernia is seen. Small bowel loops are not dilated. Appendix is not dilated. There is no significant wall thickening in colon. There is no pericolic stranding. Vascular/Lymphatic: Scattered coarse calcifications are seen in abdominal aorta and its major branches. Reproductive: Prostate is enlarged. Few small calcifications are noted in prostate. Other: There is no pneumoperitoneum. There is small amount of free fluid in right pelvic cavity. Small umbilical hernia containing fat is seen. Musculoskeletal: Degenerative changes are noted in lumbar spine, more severe at L1-L2 and L2-L3 levels. IMPRESSION: There are small patchy infiltrates in right upper lobe suggesting pneumonia. Moderate bilateral pleural effusions. Coronary artery disease. There is prosthetic mitral valve and a metallic clamp in the left atrial appendage. There is ectasia of ascending thoracic aorta measuring 4.3 cm. Recommend annual imaging followup by CTA or MRA. This recommendation follows 2010 ACCF/AHA/AATS/ACR/ASA/SCA/SCAI/SIR/STS/SVM Guidelines for the Diagnosis and Management of Patients with Thoracic Aortic  Disease. Circulation. 2010; 121: P824-M353. Aortic aneurysm NOS (ICD10-I71.9) There is no evidence of intestinal obstruction or pneumoperitoneum. There is no hydronephrosis. There is 1.8 cm left adrenal nodule which appears stable, possibly adenoma. There is subtle increased density in the lumen of gallbladder suggesting presence of sludge and possibly tiny stones. Small hiatal hernia. Lumbar spondylosis. Arteriosclerosis. Enlarged prostate. Other findings as described in the body of the report. Electronically Signed   By: Ernie Avena M.D.   On: 03/18/2022 09:25   CARDIAC CATHETERIZATION  Result Date: 03/17/2022 Findings: RA = 8 RV = 45/10 PA = 48/16 (28) PCW = 22 (v = 30) Fick cardiac output/index = 5.7/2.7 Thermo CO/CI = 4.7/2.2 PVR = 0.8 WU Ao sat = 94% PA sat = 54%, 57% PAPI = 4.0 Assessment: 1. Elevated filling pressures with moderately decreased cardiac output (suspect thermodilution numbers are more accurate) Plan/Discussion: Continue diuresis. Continue w/u for advanced therapies Arvilla Meres, MD 3:56 PM   Discharge Medications   Allergies as of 03/18/2022   No Known Allergies      Medication List     STOP taking these medications    albuterol 108 (90 Base) MCG/ACT inhaler Commonly known as: VENTOLIN HFA Replaced by: albuterol (2.5  MG/3ML) 0.083% nebulizer solution   gabapentin 300 MG capsule Commonly known as: NEURONTIN       TAKE these medications    albuterol (2.5 MG/3ML) 0.083% nebulizer solution Commonly known as: PROVENTIL Inhale 3 mLs into the lungs every 6 (six) hours as needed for wheezing or shortness of breath. Replaces: albuterol 108 (90 Base) MCG/ACT inhaler   amiodarone 200 MG tablet Commonly known as: PACERONE Take 0.5 tablets (100 mg total) by mouth daily.   aspirin EC 81 MG tablet Take 1 tablet (81 mg total) by mouth daily. Swallow whole.   atorvastatin 80 MG tablet Commonly known as: LIPITOR Take 1 tablet (80 mg total) by mouth daily.    dapagliflozin propanediol 10 MG Tabs tablet Commonly known as: FARXIGA Take 1 tablet (10 mg total) by mouth daily.   digoxin 0.125 MG tablet Commonly known as: LANOXIN Take 0.5 tablets (0.0625 mg total) by mouth daily.   escitalopram 10 MG tablet Commonly known as: LEXAPRO TAKE ONE TABLET BY MOUTH ONCE DAILY   esomeprazole 40 MG capsule Commonly known as: NEXIUM Take 40 mg by mouth daily.   ezetimibe 10 MG tablet Commonly known as: ZETIA Take 0.5 tablets (5 mg total) by mouth daily.   latanoprost 0.005 % ophthalmic solution Commonly known as: XALATAN Place 1 drop into both eyes at bedtime.   midodrine 10 MG tablet Commonly known as: PROAMATINE Take 1 tablet (10 mg total) by mouth 3 (three) times daily.   nitroGLYCERIN 0.4 MG SL tablet Commonly known as: NITROSTAT Place 1 tablet (0.4 mg total) under the tongue every 5 (five) minutes as needed for chest pain.   OVER THE COUNTER MEDICATION Apply 1 application  topically daily as needed (For back pain). Medication: Mag Maxx Cream   potassium chloride SA 20 MEQ tablet Commonly known as: KLOR-CON M Take 2 tablets (40 mEq total) by mouth daily.   spironolactone 25 MG tablet Commonly known as: ALDACTONE Take 1 tablet (25 mg total) by mouth daily. What changed: how much to take   torsemide 20 MG tablet Commonly known as: DEMADEX Take 3 tablets (60 mg total) by mouth daily. What changed: how much to take   traZODone 50 MG tablet Commonly known as: DESYREL Take 1 tablet (50 mg total) by mouth at bedtime.   Trelegy Ellipta 100-62.5-25 MCG/ACT Aepb Generic drug: Fluticasone-Umeclidin-Vilant Inhale 1 puff into the lungs daily.   warfarin 1 MG tablet Commonly known as: COUMADIN Take as directed. If you are unsure how to take this medication, talk to your nurse or doctor. Original instructions: Patient takes 3 tablets by mouth on Monday and Fridays and 2 tablets all other days.               Durable Medical  Equipment  (From admission, onward)           Start     Ordered   03/18/22 1123  Heart failure home health orders  (Heart failure home health orders / Face to face)  Once       Comments: Heart Failure Follow-up Care:  Verify follow-up appointments per Patient Discharge Instructions. Confirm transportation arranged. Reconcile home medications with discharge medication list. Remove discontinued medications from use. Assist patient/caregiver to manage medications using pill box. Reinforce low sodium food selection Assessments: Vital signs and oxygen saturation at each visit. Assess home environment for safety concerns, caregiver support and availability of low-sodium foods. Consult Child psychotherapist, PT/OT, Dietitian, and CNA based on assessments. Perform comprehensive cardiopulmonary assessment. Notify  MD for any change in condition or weight gain of 3 pounds in one day or 5 pounds in one week with symptoms. Daily Weights and Symptom Monitoring: Ensure patient has access to scales. Teach patient/caregiver to weigh daily before breakfast and after voiding using same scale and record.    Teach patient/caregiver to track weight and symptoms and when to notify Provider. Activity: Develop individualized activity plan with patient/caregiver.   Question Answer Comment  Heart Failure Follow-up Care Advanced Heart Failure (AHF) Clinic at [phone]   Obtain the following labs Basic Metabolic Panel   Lab frequency Other see comments   Fax lab results to AHF Clinic at [phone]   Diet Low Sodium Heart Healthy   Fluid restrictions: 2000 mL Fluid      03/18/22 1124   03/18/22 1121  For home use only DME Nebulizer machine  Once       Question Answer Comment  Patient needs a nebulizer to treat with the following condition Heart failure (HCC)   Patient needs a nebulizer to treat with the following condition Pleural effusion   Length of Need Lifetime      03/18/22 1122             Disposition   The patient will be discharged in stable condition to home. Discharge Instructions     (HEART FAILURE PATIENTS) Call MD:  Anytime you have any of the following symptoms: 1) 3 pound weight gain in 24 hours or 5 pounds in 1 week 2) shortness of breath, with or without a dry hacking cough 3) swelling in the hands, feet or stomach 4) if you have to sleep on extra pillows at night in order to breathe.   Complete by: As directed    Diet - low sodium heart healthy   Complete by: As directed    Increase activity slowly   Complete by: As directed        Follow-up Information     [facility] Follow up.   Specialty: Home Health Services Why: Home Health RN -agency will call to arrange follow up appt Contact information: [address] [place] [address] [phone]         [name], NP .   Specialty: Nurse Practitioner Contact information: [address] [place] [address] [phone]         [facility] Follow up on 03/31/2022.   Specialty: Cardiology Why: Follow-up in advanced heart failure clinic 03/31/22 at [facility] C, Free valet Contact information: [address] [id number] mc [place] [address] [phone]        [facility] Follow up on 03/25/2022.   Specialty: Cardiology Why: Show up for labs at the advanced heart failure clinic 03/25/22 at 1230pm  Entrance C, free valet Contact information: [address] [id number] mc [place] [address] [phone]                  Duration of Discharge Encounter: Greater than 35 minutes   Signed, Alen Bleacher, AGACNP-BC  03/18/2022, 3:41 PM  Patient seen and examined with the above-signed Advanced Practice Provider and/or Housestaff. I personally reviewed laboratory data, imaging studies and relevant notes. I  independently examined the patient and formulated the important aspects of the plan. I have edited the note to reflect any of my changes or salient points. I have personally discussed the  plan with the patient and/or family.  He is improved with diuresis. Felt not to be a VAD candidate by TCTS. RHC reassuring.   Ok for d/c today with close f/u in HF Clinic to manage fluid status closely.    Arvilla Meres, MD  1:27 PM

## 2022-03-18 NOTE — Progress Notes (Signed)
Pt d/c with no distress noted, vs wnl, family at bedside, PICC d/c and pressure dressing done. Reviewed d/c meds.

## 2022-03-18 NOTE — Consult Note (Signed)
Consultation Note Date: 03/18/2022   Patient Name: Keith Jenkins  DOB: 07-Aug-1945  MRN: [id number]  Age / Sex: 76 y.o., male  PCP: [name], NP Referring Physician: Dolores [name], MD  Reason for Consultation: LVAD assessment   HPI/Patient Profile:   76 y.o. male with COPD, GERD/dysphagia, CAD, chronic systolic HF (last echo 5/23 EF 30-35%) and mitral regurgitation s/p CABG/MVR 5/23.     Pt direct admitted from clinic with weakness, multiple falls, volume overlead, and overall feeling poor. Echo 03/12/22 EF drop 20-25%, severe LV dysfunction with global HK, + large Left pleural effusion, RV moderately down, moderate aortic stenosis, suspect component of low gradient AS, dimensionless index = 0.31. ReDS clip 47%, volume overloaded. Admitted for IV diuresis and VAD workup.   Palliative medicine consult for assessment of LVAD therapy   Clinical Assessment and Goals of Care:  This NP Lorinda Creed reviewed medical records, received report from team, assessed the patient and then meet at the bedside with his wife to discuss advanced directives and a preparedness plan as patient contemplates LVAD therapy.  After introduction of the role of palliative medicine and holistic treatment plan patient and his wife adamantly tell me that he has no interest in an LVAD and the plan is for him to go home today.  Support offered to patient's decision.  I was able to offer education on advance care planning and the importance of documentation of a healthcare power of attorney and living well.  MOST form was produced and left with patient to review  Education offered today regarding  the importance of continued conversation with family and their  medical providers regarding overall plan of care and treatment options,  ensuring decisions are within the context of the patients values and GOCs.   No Known  Allergies  Review of Systems  Constitutional:  Positive for fatigue.    Physical Exam Constitutional:      Appearance: He is underweight.  Cardiovascular:     Rate and Rhythm: Normal rate.  Pulmonary:     Effort: Pulmonary effort is normal.  Musculoskeletal:     Comments: Generalized weakness  Skin:    General: Skin is warm and dry.  Neurological:     Mental Status: He is alert and oriented to person, place, and time.     Vital Signs: BP 104/63 (BP Location: Left Arm)   Pulse 66   Temp 97.7 F (36.5 C) (Oral)   Resp 17   Ht 6\' 1"  (1.854 m)   Wt 86.7 kg   SpO2 95%   BMI 25.22 kg/m  Pain Scale: 0-10 POSS *See Group Information*: 1-Acceptable,Awake and alert Pain Score: 0-No pain   SpO2: SpO2: 95 % O2 Device:SpO2: 95 % O2 Flow Rate: .   IO: Intake/output summary:  Intake/Output Summary (Last 24 hours) at 03/18/2022 0842 Last data filed at 03/18/2022 0829 Gross per 24 hour  Intake 60 ml  Output 1525 ml  Net -1465 ml    LBM: Last BM Date : 03/14/22  Baseline Weight: Weight: 89.2 kg Most recent weight: Weight: 86.7 kg     Palliative Assessment/Data:  Discussed with Heart Failure Team   Time In:  1500 Time Out: 1600 Time Total: 60 minutes Greater than 50%  of this time was spent counseling and coordinating care related to the above assessment and plan.  Signed by: Wadie Lessen, NP   Please contact Palliative Medicine Team phone at [phone] for questions and concerns.  For individual provider: See Shea Evans

## 2022-03-18 NOTE — Progress Notes (Signed)
RUE PICC removed. Site unremarkable. Vaseline gauze and gauze pressure dressing applied. Pt instructed to remain flat for :30. Keep dressing dry and intact x 24 hours. Pt and wife voiced understanding.

## 2022-03-18 NOTE — Progress Notes (Signed)
Attempted Pre CABG workup, patient refused.   Keith Jenkins 03/18/2022 9:13 AM

## 2022-03-18 NOTE — Progress Notes (Signed)
VAD coordinator saw pt for f/u today and to show him VAD equipment. Per Dr Haroldine Laws and DR Cyndia Bent. Pt not a candidate for VAD. Orders d/cd.   Tanda Rockers RN, BSN VAD Coordinator 24/7 Pager [phone]

## 2022-03-18 NOTE — TOC Initial Note (Addendum)
Transition of Care [facility]) - Initial/Assessment Note    Patient Details  Name: Keith Jenkins MRN: [id number] Date of Birth: [birth year]  Transition of Care [facility]) CM/SW Contact:    Elliot Cousin, RN Phone Number: [phone] 03/18/2022, 12:28 PM  Clinical Narrative:                 Spoke to pt, wife and dtr at bedside. Meds delivered to room. HF TOC CM contacted [facility] rep, Kelly for [facility]. Contacted [employer] for nebulizer machine to be delivered to room. Wife will provide transportation home.   Expected Discharge Plan: Home w Home Health Services Barriers to Discharge: No Barriers Identified   Patient Goals and CMS Choice Patient states their goals for this hospitalization and ongoing recovery are:: wants to remain independent CMS Medicare.gov Compare Post Acute Care list provided to:: Patient    Expected Discharge Plan and Services Expected Discharge Plan: Home w Home Health Services   Discharge Planning Services: CM Consult Post Acute Care Choice: Home Health Living arrangements for the past 2 months: Single Family Home                 DME Arranged: Nebulizer machine DME Agency: [employer] Date DME Agency Contacted: 03/18/22 Time DME Agency Contacted: 1227 Representative spoke with at DME Agency: [facility] HH Arranged: RN HH Agency: [facility] Date [facility] Agency Contacted: 03/18/22 Time HH Agency Contacted: 1227 Representative spoke with at [facility] Agency: [place]el N[place]  Prior Living Arrangements/Services Living arrangements for the past 2 months: Single Family Home Lives with:: Spouse Patient language and need for interpreter reviewed:: Yes Do you feel safe going back to the place where you live?: Yes      Need for Family Participation in Patient Care: No (Comment) Care giver support system in place?: Yes (comment) Current home services: DME ([employer], 3n1 bedside commode) Criminal Activity/Legal Involvement Pertinent to Current Situation/Hospitalization:  No - Comment as needed  Activities of Daily Living Home Assistive Devices/Equipment: None ADL Screening (condition at time of admission) Patient's cognitive ability adequate to safely complete daily activities?: Yes Is the patient deaf or have difficulty hearing?: No Does the patient have difficulty seeing, even when wearing glasses/contacts?: No Does the patient have difficulty concentrating, remembering, or making decisions?: Yes Patient able to express need for assistance with ADLs?: Yes Does the patient have difficulty dressing or bathing?: No Independently performs ADLs?: Yes (appropriate for developmental age) Bathing: Needs assistance Is this a change from baseline?: Change from baseline, expected to last >3 days Toileting: Needs assistance Is this a change from baseline?: Change from baseline, expected to last >3days In/Out Bed: Needs assistance Is this a change from baseline?: Change from baseline, expected to last >3 days Does the patient have difficulty walking or climbing stairs?: No Weakness of Legs: None Weakness of Arms/Hands: None  Permission Sought/Granted Permission sought to share information with : Case Manager, Family Supports, PCP Permission granted to share information with : Yes, Verbal Permission Granted  Share Information with NAME: Chino Sardo     Permission granted to share info w Relationship: wife  Permission granted to share info w Contact Information: [phone]  Emotional Assessment Appearance:: Appears stated age Attitude/Demeanor/Rapport: Engaged Affect (typically observed): Accepting, Pleasant Orientation: : Oriented to Self, Oriented to Place, Oriented to  Time, Oriented to Situation   Psych Involvement: No (comment)  Admission diagnosis:  Acute CHF (congestive heart failure) (HCC) [I50.9] Patient Active Problem List   Diagnosis Date Noted   Acute  on chronic systolic heart failure ([place]) 03/15/2022   Acute CHF (congestive heart failure)  ([place]) 03/12/2022   Heat syncope 02/24/2022   Contusion of scalp 02/24/2022   Scalp laceration 02/24/2022   Fall on same level 02/24/2022   Need for influenza vaccination 02/24/2022   Microcytic anemia 02/24/2022   Stage 3b chronic kidney disease (CKD) ([place]) 02/24/2022   Long term (current) use of anticoagulants 11/26/2021   Mitral valve replaced 11/26/2021   Esophageal dysphagia    Mitral regurgitation 11/01/2021   S/P mitral valve replacement 11/01/2021   Acute on chronic combined systolic (congestive) and diastolic (congestive) heart failure ([place]) 10/24/2021   Pericardial effusion    Severe mitral regurgitation    NSTEMI (non-ST elevated myocardial infarction) ([place]) 10/10/2021   Non-ST elevation (NSTEMI) myocardial infarction ([place]) 71/11/2692   Acute systolic heart failure ([place]) 10/10/2021   Hyperlipidemia 10/10/2021   PCP:  Rip Harbour, NP Pharmacy:   [place] [place] [address] Phone: [phone] Fax: [phone]     Social Determinants of Health (SDOH) Interventions Alcohol Usage Interventions: Intervention Not Indicated (Score <7)  Readmission Risk Interventions    03/14/2022   11:02 AM  Readmission Risk Prevention Plan  Transportation Screening Complete  Medication Review (RN Care Manager) Complete  PCP or Specialist appointment within 3-5 days of discharge Complete  HRI or Home Care Consult Complete

## 2022-03-18 NOTE — Procedures (Signed)
Thoracentesis  Procedure Note  Keith Jenkins  [id number]  November 14, 1945  Date:03/18/22  Time:3:02 PM   Provider Performing:Jacqueli Pangallo C Scotlynn Noyes   Procedure: Thoracentesis with imaging guidance ([id number])  Indication(s) Pleural Effusion  Consent Risks of the procedure as well as the alternatives and risks of each were explained to the patient and/or caregiver.  Consent for the procedure was obtained and is signed in the bedside chart  Anesthesia Topical only with 1% lidocaine    Time Out Verified patient identification, verified procedure, site/side was marked, verified correct patient position, special equipment/implants available, medications/allergies/relevant history reviewed, required imaging and test results available.   Sterile Technique Maximal sterile technique including full sterile barrier drape, hand hygiene, sterile gown, sterile gloves, mask, hair covering, sterile ultrasound probe cover (if used).  Procedure Description Ultrasound was used to identify appropriate pleural anatomy for placement and overlying skin marked.  Area of drainage cleaned and draped in sterile fashion. Lidocaine was used to anesthetize the skin and subcutaneous tissue.  600 cc's of straw appearing fluid was drained from the left pleural space. Catheter then removed and bandaid applied to site.   Complications/Tolerance None; patient tolerated the procedure well. Chest X-ray is ordered to confirm no post-procedural complication.   EBL Minimal   Specimen(s) None

## 2022-03-19 ENCOUNTER — Telehealth: Payer: Self-pay | Admitting: *Deleted

## 2022-03-19 LAB — PSA, TOTAL AND FREE
PSA, Free Pct: 20 %
PSA, Free: 0.08 ng/mL
Prostate Specific Ag, Serum: 0.4 ng/mL (ref 0.0–4.0)

## 2022-03-19 LAB — HEMOGLOBIN A1C
Hgb A1c MFr Bld: 6 % — ABNORMAL HIGH (ref 4.8–5.6)
Mean Plasma Glucose: 126 mg/dL

## 2022-03-19 LAB — LUPUS ANTICOAGULANT PANEL
DRVVT: 42.9 s (ref 0.0–47.0)
PTT Lupus Anticoagulant: 42.2 s (ref 0.0–43.5)

## 2022-03-19 LAB — TRIGLYCERIDES, BODY FLUIDS: Triglycerides, Fluid: 9 mg/dL

## 2022-03-19 NOTE — Patient Outreach (Signed)
Care Coordination TOC Note Transition Care Management Follow-up Telephone Call Date of discharge and from where: March 18, 2022 [facility] CHF exacerbation, pleural effusion, (R) heart cath/ thoracentesis How have you been since you were released from the hospital? "Overall I am doing real good; my family looks out for me and make sure I have everything I need; I am weighing myself every day and understand what to do if I start gaining weight-- call Dr. Haroldine Laws right away; today my weight was 171 lbs, which is right on track-- I feel so much better with this fluid off of me.... I am not short of breath or having any problems" Any questions or concerns? No  Items Reviewed: Did the pt receive and understand the discharge instructions provided? Yes  Reviewed thoroughly with patient and spouse  Medications obtained and verified? Yes  Other? No  Any new allergies since your discharge? No  Dietary orders reviewed? Yes Do you have support at home? Yes  spouse and daughter assisting with care needs as indicated  Home Care and Equipment/Supplies: Were home health services ordered? yes If so, what is the name of the agency? [facility]- wife confirms they have left voice message; states she will be calling them right after our call is over  Has the agency set up a time to come to the patient's home? No, "not yet" wife to return home health team's call today once TOC call completed Were any new equipment or medical supplies ordered?  Yes: nebulizer What is the name of the medical supply agency? "Can't remember- they delivered it to the hospital before we left" Were you able to get the supplies/equipment? yes Do you have any questions related to the use of the equipment or supplies? No  Functional Questionnaire: (I = Independent and D = Dependent) ADLs: I  spouse and daughter assisting with care needs as indicated  Bathing/Dressing- I spouse and daughter assisting with care  needs as indicated   Meal Prep- D spouse prepares meals  Eating- I  Maintaining continence- I  Transferring/Ambulation- I spouse and daughter assisting with care needs as indicated   Managing Meds- I  spouse and daughter assisting with care needs as indicated  Follow up appointments reviewed:  PCP Hospital f/u appt confirmed? Yes  Scheduled to see NP Larene Beach Heaton/ PCP on 04/14/22- as per recommendation post-discharge [facility] f/u appt confirmed? Yes  Scheduled to see heart failure provider/ CHF clinic on 03/31/22 @ 2:00 pm; patient and spouse understand they are to go have labs drawn by heart failure clinic on 03/25/22 Are transportation arrangements needed? No  If their condition worsens, is the pt aware to call PCP or go to the Emergency Dept.? Yes Was the patient provided with contact information for the PCP's office or ED? Yes Was to pt encouraged to call back with questions or concerns? Yes  SDOH assessments and interventions completed:   Yes  Care Coordination Interventions Activated:  Yes   Care Coordination Interventions:  Provided education around purpose of labs post-hospital discharge; reinforced education around purpose of daily weights with weight gain guidelines, in setting of CHF; provided education around use of diuretics in setting of CHF/ need to maintain fluid intake without overloading for prevention of dehydration; provided education and reinforcement around importance of following low salt diet in setting of CHF    Encounter Outcome:  Pt. Visit Completed    Oneta Rack, RN, BSN, CCRN Alumnus RN CM Care Coordination/ Transition of  Care- [place] Management [phone]: direct office

## 2022-03-19 NOTE — Patient Outreach (Signed)
  Care Coordination [facility] Note Transition Care Management Unsuccessful Follow-up Telephone Call  Date of discharge and from where:  [name]; CHF exacerbation, pleural effusion, cardiac cath  Attempts:  1st Attempt  Reason for unsuccessful TCM follow-up call:  Left voice message- left messages x 2 on both phone numbers listed for patient, requesting call-back  Oneta Rack, RN, BSN, CCRN Alumnus RN CM Care Coordination/ Transition of [place] Management [phone]: direct office

## 2022-03-20 ENCOUNTER — Encounter (HOSPITAL_COMMUNITY): Payer: Self-pay

## 2022-03-20 LAB — CYTOLOGY - NON PAP

## 2022-03-20 LAB — HEPATITIS B SURFACE ANTIBODY, QUANTITATIVE: Hep B S AB Quant (Post): <3.1 m[IU]/mL — ABNORMAL LOW (ref >9.9)

## 2022-03-23 LAB — CULTURE, BODY FLUID W GRAM STAIN -BOTTLE: Culture: NO GROWTH

## 2022-03-25 ENCOUNTER — Ambulatory Visit: Payer: Medicare HMO | Attending: Cardiology

## 2022-03-25 ENCOUNTER — Other Ambulatory Visit (HOSPITAL_COMMUNITY): Payer: Medicare HMO

## 2022-03-25 ENCOUNTER — Other Ambulatory Visit: Payer: Self-pay

## 2022-03-25 DIAGNOSIS — Z7901 Long term (current) use of anticoagulants: Secondary | ICD-10-CM

## 2022-03-25 DIAGNOSIS — Z952 Presence of prosthetic heart valve: Secondary | ICD-10-CM

## 2022-03-25 LAB — POCT INR: INR: 1.4 — AB (ref 2.0–3.0)

## 2022-03-25 MED ORDER — WARFARIN SODIUM 1 MG PO TABS: ORAL_TABLET | ORAL | 0 refills | Status: DC

## 2022-03-25 NOTE — Telephone Encounter (Signed)
Prescription refill request received for warfarin [place]: 03/12/22  Next INR check: 04/01/22 Warfarin tablet strength: 1mg   Appropriate dose and refill sent to requested pharmacy.

## 2022-03-25 NOTE — Patient Instructions (Signed)
Description   Take 3 tablets today and 2.5 tablets tomorrow and then continue taking 2 tablets (2mg ) daily except 3 tablets (3mg ) on Mondays and Fridays.  Recheck INR in 1 week. Coumadin Clinic [phone]

## 2022-03-26 DIAGNOSIS — I480 Paroxysmal atrial fibrillation: Secondary | ICD-10-CM | POA: Diagnosis not present

## 2022-03-26 DIAGNOSIS — I5022 Chronic systolic (congestive) heart failure: Secondary | ICD-10-CM | POA: Diagnosis not present

## 2022-03-26 DIAGNOSIS — I2510 Atherosclerotic heart disease of native coronary artery without angina pectoris: Secondary | ICD-10-CM | POA: Diagnosis not present

## 2022-03-26 DIAGNOSIS — M1990 Unspecified osteoarthritis, unspecified site: Secondary | ICD-10-CM | POA: Diagnosis not present

## 2022-03-26 DIAGNOSIS — H409 Unspecified glaucoma: Secondary | ICD-10-CM | POA: Diagnosis not present

## 2022-03-26 DIAGNOSIS — I252 Old myocardial infarction: Secondary | ICD-10-CM | POA: Diagnosis not present

## 2022-03-26 DIAGNOSIS — Z7901 Long term (current) use of anticoagulants: Secondary | ICD-10-CM | POA: Diagnosis not present

## 2022-03-26 DIAGNOSIS — Z87891 Personal history of nicotine dependence: Secondary | ICD-10-CM | POA: Diagnosis not present

## 2022-03-26 DIAGNOSIS — N1832 Chronic kidney disease, stage 3b: Secondary | ICD-10-CM | POA: Diagnosis not present

## 2022-03-26 DIAGNOSIS — Z9181 History of falling: Secondary | ICD-10-CM | POA: Diagnosis not present

## 2022-03-26 DIAGNOSIS — Z954 Presence of other heart-valve replacement: Secondary | ICD-10-CM | POA: Diagnosis not present

## 2022-03-26 DIAGNOSIS — J449 Chronic obstructive pulmonary disease, unspecified: Secondary | ICD-10-CM | POA: Diagnosis not present

## 2022-03-26 DIAGNOSIS — R1319 Other dysphagia: Secondary | ICD-10-CM | POA: Diagnosis not present

## 2022-03-26 DIAGNOSIS — E785 Hyperlipidemia, unspecified: Secondary | ICD-10-CM | POA: Diagnosis not present

## 2022-03-26 DIAGNOSIS — K222 Esophageal obstruction: Secondary | ICD-10-CM | POA: Diagnosis not present

## 2022-03-26 DIAGNOSIS — Z951 Presence of aortocoronary bypass graft: Secondary | ICD-10-CM | POA: Diagnosis not present

## 2022-03-26 DIAGNOSIS — I5023 Acute on chronic systolic (congestive) heart failure: Secondary | ICD-10-CM | POA: Diagnosis not present

## 2022-03-26 DIAGNOSIS — I350 Nonrheumatic aortic (valve) stenosis: Secondary | ICD-10-CM | POA: Diagnosis not present

## 2022-03-26 DIAGNOSIS — K219 Gastro-esophageal reflux disease without esophagitis: Secondary | ICD-10-CM | POA: Diagnosis not present

## 2022-03-26 DIAGNOSIS — Z7982 Long term (current) use of aspirin: Secondary | ICD-10-CM | POA: Diagnosis not present

## 2022-03-26 DIAGNOSIS — D509 Iron deficiency anemia, unspecified: Secondary | ICD-10-CM | POA: Diagnosis not present

## 2022-03-27 DIAGNOSIS — Z9181 History of falling: Secondary | ICD-10-CM | POA: Diagnosis not present

## 2022-03-27 DIAGNOSIS — N1832 Chronic kidney disease, stage 3b: Secondary | ICD-10-CM | POA: Diagnosis not present

## 2022-03-27 DIAGNOSIS — D509 Iron deficiency anemia, unspecified: Secondary | ICD-10-CM | POA: Diagnosis not present

## 2022-03-27 DIAGNOSIS — I2510 Atherosclerotic heart disease of native coronary artery without angina pectoris: Secondary | ICD-10-CM | POA: Diagnosis not present

## 2022-03-27 DIAGNOSIS — E785 Hyperlipidemia, unspecified: Secondary | ICD-10-CM | POA: Diagnosis not present

## 2022-03-27 DIAGNOSIS — M1990 Unspecified osteoarthritis, unspecified site: Secondary | ICD-10-CM | POA: Diagnosis not present

## 2022-03-27 DIAGNOSIS — Z951 Presence of aortocoronary bypass graft: Secondary | ICD-10-CM | POA: Diagnosis not present

## 2022-03-27 DIAGNOSIS — I350 Nonrheumatic aortic (valve) stenosis: Secondary | ICD-10-CM | POA: Diagnosis not present

## 2022-03-27 DIAGNOSIS — I480 Paroxysmal atrial fibrillation: Secondary | ICD-10-CM | POA: Diagnosis not present

## 2022-03-27 DIAGNOSIS — Z7982 Long term (current) use of aspirin: Secondary | ICD-10-CM | POA: Diagnosis not present

## 2022-03-27 DIAGNOSIS — J449 Chronic obstructive pulmonary disease, unspecified: Secondary | ICD-10-CM | POA: Diagnosis not present

## 2022-03-27 DIAGNOSIS — Z7901 Long term (current) use of anticoagulants: Secondary | ICD-10-CM | POA: Diagnosis not present

## 2022-03-27 DIAGNOSIS — Z954 Presence of other heart-valve replacement: Secondary | ICD-10-CM | POA: Diagnosis not present

## 2022-03-27 DIAGNOSIS — I5023 Acute on chronic systolic (congestive) heart failure: Secondary | ICD-10-CM | POA: Diagnosis not present

## 2022-03-27 DIAGNOSIS — K219 Gastro-esophageal reflux disease without esophagitis: Secondary | ICD-10-CM | POA: Diagnosis not present

## 2022-03-27 DIAGNOSIS — Z87891 Personal history of nicotine dependence: Secondary | ICD-10-CM | POA: Diagnosis not present

## 2022-03-27 DIAGNOSIS — H409 Unspecified glaucoma: Secondary | ICD-10-CM | POA: Diagnosis not present

## 2022-03-27 DIAGNOSIS — R1319 Other dysphagia: Secondary | ICD-10-CM | POA: Diagnosis not present

## 2022-03-27 DIAGNOSIS — K222 Esophageal obstruction: Secondary | ICD-10-CM | POA: Diagnosis not present

## 2022-03-27 DIAGNOSIS — I252 Old myocardial infarction: Secondary | ICD-10-CM | POA: Diagnosis not present

## 2022-03-28 ENCOUNTER — Other Ambulatory Visit (HOSPITAL_COMMUNITY): Payer: Self-pay | Admitting: Internal Medicine

## 2022-03-28 ENCOUNTER — Other Ambulatory Visit (HOSPITAL_COMMUNITY): Payer: Self-pay

## 2022-03-31 ENCOUNTER — Ambulatory Visit
Admit: 2022-03-31 | Discharge: 2022-03-31 | Disposition: A | Discharge status: Home / Self Care | Payer: Medicare HMO | Attending: Family Medicine | Admitting: Family Medicine

## 2022-03-31 ENCOUNTER — Encounter: Payer: Self-pay | Admitting: Internal Medicine

## 2022-03-31 ENCOUNTER — Encounter (HOSPITAL_COMMUNITY): Payer: Self-pay

## 2022-03-31 VITALS — BP 102/64 | HR 65 | Wt 189.8 lb

## 2022-03-31 DIAGNOSIS — I480 Paroxysmal atrial fibrillation: Secondary | ICD-10-CM | POA: Insufficient documentation

## 2022-03-31 DIAGNOSIS — E785 Hyperlipidemia, unspecified: Secondary | ICD-10-CM | POA: Diagnosis not present

## 2022-03-31 DIAGNOSIS — K219 Gastro-esophageal reflux disease without esophagitis: Secondary | ICD-10-CM | POA: Diagnosis not present

## 2022-03-31 DIAGNOSIS — Z79899 Other long term (current) drug therapy: Secondary | ICD-10-CM | POA: Diagnosis not present

## 2022-03-31 DIAGNOSIS — I5022 Chronic systolic (congestive) heart failure: Secondary | ICD-10-CM | POA: Diagnosis not present

## 2022-03-31 DIAGNOSIS — Z7901 Long term (current) use of anticoagulants: Secondary | ICD-10-CM | POA: Insufficient documentation

## 2022-03-31 DIAGNOSIS — I351 Nonrheumatic aortic (valve) insufficiency: Secondary | ICD-10-CM | POA: Diagnosis not present

## 2022-03-31 DIAGNOSIS — Z951 Presence of aortocoronary bypass graft: Secondary | ICD-10-CM | POA: Insufficient documentation

## 2022-03-31 DIAGNOSIS — D649 Anemia, unspecified: Secondary | ICD-10-CM

## 2022-03-31 DIAGNOSIS — I252 Old myocardial infarction: Secondary | ICD-10-CM | POA: Diagnosis not present

## 2022-03-31 DIAGNOSIS — R7303 Prediabetes: Secondary | ICD-10-CM | POA: Diagnosis not present

## 2022-03-31 DIAGNOSIS — I080 Rheumatic disorders of both mitral and aortic valves: Secondary | ICD-10-CM | POA: Insufficient documentation

## 2022-03-31 DIAGNOSIS — J90 Pleural effusion, not elsewhere classified: Secondary | ICD-10-CM | POA: Insufficient documentation

## 2022-03-31 DIAGNOSIS — I2510 Atherosclerotic heart disease of native coronary artery without angina pectoris: Secondary | ICD-10-CM | POA: Diagnosis not present

## 2022-03-31 DIAGNOSIS — Z7984 Long term (current) use of oral hypoglycemic drugs: Secondary | ICD-10-CM | POA: Insufficient documentation

## 2022-03-31 DIAGNOSIS — K222 Esophageal obstruction: Secondary | ICD-10-CM | POA: Diagnosis not present

## 2022-03-31 DIAGNOSIS — I3139 Other pericardial effusion (noninflammatory): Secondary | ICD-10-CM

## 2022-03-31 DIAGNOSIS — Z953 Presence of xenogenic heart valve: Secondary | ICD-10-CM | POA: Insufficient documentation

## 2022-03-31 DIAGNOSIS — J449 Chronic obstructive pulmonary disease, unspecified: Secondary | ICD-10-CM | POA: Diagnosis not present

## 2022-03-31 DIAGNOSIS — Z952 Presence of prosthetic heart valve: Secondary | ICD-10-CM | POA: Diagnosis not present

## 2022-03-31 DIAGNOSIS — D509 Iron deficiency anemia, unspecified: Secondary | ICD-10-CM | POA: Insufficient documentation

## 2022-03-31 DIAGNOSIS — I309 Acute pericarditis, unspecified: Secondary | ICD-10-CM | POA: Insufficient documentation

## 2022-03-31 LAB — CBC
HCT: 30.7 % — ABNORMAL LOW (ref 39.0–52.0)
Hemoglobin: 10.2 g/dL — ABNORMAL LOW (ref 13.0–17.0)
MCH: 24.9 pg — ABNORMAL LOW (ref 26.0–34.0)
MCHC: 33.2 g/dL (ref 30.0–36.0)
MCV: 74.9 fL — ABNORMAL LOW (ref 80.0–100.0)
Platelets: 198 10*3/uL (ref 150–400)
RBC: 4.1 MIL/uL — ABNORMAL LOW (ref 4.22–5.81)
RDW: 29.9 % — ABNORMAL HIGH (ref 11.5–15.5)
WBC: 5.2 10*3/uL (ref 4.0–10.5)
nRBC: 0 % (ref 0.0–0.2)

## 2022-03-31 LAB — FACTOR 5 LEIDEN

## 2022-03-31 LAB — BASIC METABOLIC PANEL
Anion gap: 10 (ref 5–15)
BUN: 32 mg/dL — ABNORMAL HIGH (ref 8–23)
CO2: 25 mmol/L (ref 22–32)
Calcium: 8.7 mg/dL — ABNORMAL LOW (ref 8.9–10.3)
Chloride: 99 mmol/L (ref 98–111)
Creatinine, Ser: 1.91 mg/dL — ABNORMAL HIGH (ref 0.61–1.24)
GFR, Estimated: 36 mL/min — ABNORMAL LOW (ref >60)
Glucose, Bld: 135 mg/dL — ABNORMAL HIGH (ref 70–99)
Potassium: 3.6 mmol/L (ref 3.5–5.1)
Sodium: 134 mmol/L — ABNORMAL LOW (ref 135–145)

## 2022-03-31 LAB — DIGOXIN LEVEL: Digoxin Level: 0.5 ng/mL — ABNORMAL LOW (ref 0.8–2.0)

## 2022-03-31 LAB — PROTIME-INR
INR: 1.7 — ABNORMAL HIGH (ref 0.8–1.2)
Prothrombin Time: 19.4 seconds — ABNORMAL HIGH (ref 11.4–15.2)

## 2022-03-31 LAB — BRAIN NATRIURETIC PEPTIDE: B Natriuretic Peptide: 1681.5 pg/mL — ABNORMAL HIGH (ref 0.0–100.0)

## 2022-03-31 NOTE — Progress Notes (Signed)
ADVANCED HF CLINIC NOTE   Primary Care: [name], NP HF Cardiologist: Dr. Gala Romney  HPI: Keith Jenkins is a 76 y.o. male with history of COPD, prediabetes, GERD/dysphagia, HLD, and new diagnosis of CAD and systolic heart failure.  Admitted 4/23 with chest pain. Ruled in for inferior STEMI, late presenting. LHC showed total occlusion mRCA and 90% mLAD. RCA managed medically d/t completed infarct.  Plan to optimize medical therapy and arrange LAD intervention at later time. Echo showed EF 20-255%, RWMA, RV moderately reduced, moderate to severe ischemic MR, aortic root 4.3 cm, dilated IVC with estimated RAP 8 mmHg. AHF consulted and GDMT titrated, but limited due to soft blood pressure. CVTS consulted for CABG/MVR, instead of PCI to LAD, due to moderate to severe ischemic MR. Plan to optimize medically and improve strength, then arrange surgery a few weeks later. Discharged home, weight 217 lbs.   Post hospital follow up 5/23, mildly volume up, REDs 44%. Lasix 20 mg daily restarted. Plan to repeat echo in 1-2 weeks and coordinate timing of surgery.  Admitted 10/25/21 with a/c CHF. Stat echo showed EF 30-35%, RV moderately reduced, moderate pericardial effusion with no tamponade. Started on milrinone. Developed AFL and amiodarone gtt started. Underwent CABG x 2 (LIMA-LAD, SVG-RAMUS INT) +MVR, MAZE, LAA Clip. Hospitalization c/b distal esophageal stricture seen on barium swallow. GI consulted and plan for outpatient EGD/dilation. Diuresed with IV lasix. Drips weaned off, GDMT limited due to hypotension and need for midodrine. Discharged home, 202 lbs.  Seen in ED 02/12/22 with fall. Given IVF, CT head negative. He also had a fall 2 prior, tripped over his feet.   Follow up 9/23, continued to feel weak with LE swelling. He was unable to complete CR due to low BP. ReDs 53%, torsemide increased to 40 bid x 3 days then back to 40 daily. Arranged for repeat echo.  Seen in ED 02/20/22 for fall  after becoming light-headed. CT head and neck negative for acute abnormality.  Echo 03/12/22 EF 20-25%, severe LV dysfunction with global HK, + large Left pleural effusion, RV moderately down, moderate aortic stenosis, suspect component of low gradient AS, dimensionless index = 0.31.Reviewed by Dr. Gala Romney. Volume overloaded and NYHA IV at follow up 03/12/22.. Direct admit from clinic for IV diuresis and VAD work up. Diuresed with IV lasix. Underwent RHC showing elevated filling pressures with moderately decreased cardiac output, Fick CO/CI 5.7/2.7 Thermo CO/CI 4.7/2.2. CVTS and Dr. Gala Romney reviewed case and felt patient not a good candidate for VAD. Patient also declined VAD. Underwent bilateral thoracentesis before discharge. Discharged home, weight 172 lbs.  Today he returns for post hospital HF follow up with his wife and daughter. Overall feeling much better. He is able to walk around and complete ADLs without SOB. Has weakness and fatigue. Mild swelling in ankles, not wearing compression hose.  Denies palpitations, abnormal bleeding, CP, dizziness,or PND/Orthopnea. Appetite improved and eating a lot now, likes [employer]. No fever or chills. Weight at home 174-175 pounds. Taking all medications.   Cardiac Studies:  - RHC (10/23): RA = 8 RV = 45/10 PA = 48/16 (28) PCW = 22 (v = 30)  Fick cardiac output/index = 5.7/2.7 Thermo CO/CI = 4.7/2.2 PVR = 0.8 WU Ao sat = 94% PA sat = 54%, 57% PAPI = 4.0   - Echo (10/23): EF 20-25%, severe LV dysfunction with global HK, + large Left pleural effusion, RV moderately down, moderate aortic stenosis, suspect component of low gradient AS, dimensionless index =  0.31  - Echo (5/23): EF 25%, RV moderately reduced, moderate to severe MR  - Echo (4/23): EF 20-25%, RWMA, RV moderately reduced, mioderate to severe ischemic MR, aortic root 4.3 cm, dilated IVC with estimated RAP 8 mmHg  - LHC (5/23): 100% m RCA and 90% m LAD. RCA managed medically d/t  completed infarct.   ROS: All systems reviewed and negative except as per HPI.   Past Medical History:  Diagnosis Date   Aortic atherosclerosis (HCC)    Arthritis    CHF (congestive heart failure) (HCC)    Esophageal stricture    Glaucoma    MI (myocardial infarction) (HCC)    Osteoarthritis    Current Outpatient Medications  Medication Sig Dispense Refill   albuterol (PROVENTIL) (2.5 MG/3ML) 0.083% nebulizer solution Inhale 3 mLs into the lungs every 6 (six) hours as needed for wheezing or shortness of breath. 90 mL 12   amiodarone (PACERONE) 200 MG tablet Take 0.5 tablets (100 mg total) by mouth daily. 45 tablet 1   aspirin EC 81 MG tablet Take 1 tablet (81 mg total) by mouth daily. Swallow whole. 90 tablet 1   atorvastatin (LIPITOR) 80 MG tablet Take 1 tablet (80 mg total) by mouth daily. 30 tablet 3   dapagliflozin propanediol (FARXIGA) 10 MG TABS tablet Take 1 tablet (10 mg total) by mouth daily. 90 tablet 1   digoxin (LANOXIN) 0.125 MG tablet Take 0.5 tablets (0.0625 mg total) by mouth daily. 15 tablet 3   escitalopram (LEXAPRO) 10 MG tablet TAKE ONE TABLET BY MOUTH ONCE DAILY 90 tablet 1   esomeprazole (NEXIUM) 40 MG capsule Take 40 mg by mouth daily.     ezetimibe (ZETIA) 10 MG tablet Take 0.5 tablets (5 mg total) by mouth daily. 15 tablet 3   Fluticasone-Umeclidin-Vilant (TRELEGY ELLIPTA) 100-62.5-25 MCG/ACT AEPB Inhale 1 puff into the lungs daily. 1 each 2   latanoprost (XALATAN) 0.005 % ophthalmic solution Place 1 drop into both eyes at bedtime.     midodrine (PROAMATINE) 10 MG tablet Take 1 tablet (10 mg total) by mouth 3 (three) times daily. 90 tablet 6   nitroGLYCERIN (NITROSTAT) 0.4 MG SL tablet Place 1 tablet (0.4 mg total) under the tongue every 5 (five) minutes as needed for chest pain. 25 tablet 3   OVER THE COUNTER MEDICATION Apply 1 application  topically daily as needed (For back pain). Medication: Mag Maxx Cream     potassium chloride SA (KLOR-CON M) 20 MEQ tablet  Take 2 tablets (40 mEq total) by mouth daily. 30 tablet 6   spironolactone (ALDACTONE) 25 MG tablet Take 1 tablet (25 mg total) by mouth daily. 60 tablet 3   torsemide (DEMADEX) 20 MG tablet Take 3 tablets (60 mg total) by mouth daily. 60 tablet 3   traZODone (DESYREL) 50 MG tablet Take 1 tablet (50 mg total) by mouth at bedtime. 30 tablet 2   warfarin (COUMADIN) 1 MG tablet Take 2 tablets to 3 tablets by mouth daily as directed by Coumadin Clinic 200 tablet 0   No current facility-administered medications for this encounter.   No Known Allergies  Social History   Socioeconomic History   Marital status: Married    Spouse name: Not on file   Number of children: Not on file   Years of education: Not on file   Highest education level: Not on file  Occupational History   Not on file  Tobacco Use   Smoking status: Former    Types: Cigarettes  Quit date: 74    Years since quitting: 53.8   Smokeless tobacco: Never  Vaping Use   Vaping Use: Never used  Substance and Sexual Activity   Alcohol use: Not Currently   Drug use: Never   Sexual activity: Not Currently    Birth control/protection: None  Other Topics Concern   Not on file  Social History Narrative   Not on file   Social Determinants of Health   Financial Resource Strain: Not on file  Food Insecurity: No Food Insecurity (03/19/2022)   Hunger Vital Sign    Worried About Running Out of Food in the Last Year: Never true    Ran Out of Food in the Last Year: Never true  Transportation Needs: No Transportation Needs (03/19/2022)   PRAPARE - Administrator, Civil Service (Medical): No    Lack of Transportation (Non-Medical): No  Physical Activity: Not on file  Stress: Not on file  Social Connections: Not on file  Intimate Partner Violence: Not At Risk (03/13/2022)   Humiliation, Afraid, Rape, and Kick questionnaire    Fear of Current or Ex-Partner: No    Emotionally Abused: No    Physically Abused: No     Sexually Abused: No   Family History  Problem Relation Age of Onset   Heart attack Mother    Hypertension Mother    Heart attack Father    Hypertension Father    Stomach cancer Sister    Brain cancer Sister    BP 102/64   Pulse 65   Wt 86.1 kg (189 lb 12.8 oz)   SpO2 98%   BMI 25.04 kg/m   Wt Readings from Last 3 Encounters:  03/31/22 86.1 kg (189 lb 12.8 oz)  03/18/22 78.4 kg (172 lb 13.5 oz)  03/12/22 91.3 kg (201 lb 3.2 oz)   PHYSICAL EXAM: General:  NAD. No resp difficulty, walked into clinic with cane HEENT: Normal Neck: Supple. No JVD. Carotids 2+ bilat; no bruits. No lymphadenopathy or thryomegaly appreciated. Cor: PMI nondisplaced. Regular rate & rhythm. No rubs, gallops, 2/6 AS Lungs: Clear Abdomen: Soft, nontender, nondistended. No hepatosplenomegaly. No bruits or masses. Good bowel sounds. Extremities: No cyanosis, clubbing, rash, trace pedal edema Neuro: Alert & oriented x 3, cranial nerves grossly intact. Moves all 4 extremities w/o difficulty. Affect pleasant.  ECG (personally reviewed): NSR 62 bpm  ASSESSMENT & PLAN: 1. Chronic systolic CHF /ICM - s/p late presentation inferior STEMI 05/23 - Echo (5/23): EF 25-30%, RV moderately reduced, moderate to severe MR. - S/p CABG + bioprosthetic MVR 11/01/21 - Echo 03/12/22 showed EF 20-25%, severe LV dysfunction with global HK, + large Left pleural effusion, RV moderately down, moderate aortic stenosis, suspect component of low gradient AS. - RHC (10/23): Elevated filling pressures with moderately decreased cardiac output. RA 8, PA 48/16, PCWP 22, CO/CI (Fick) 5.7/2.7. - Felt not to be a suitable VAD candidate (Drs. Gala Romney & Bartle discussed). Patient declined VAD as well. - Will focus on aggressive medical therapy with tight volume control. (Potentially candidate for ADI volume management trial?). - Improved NYHA II-early III, limited by deconditioning. Volume looks good today, weight up from discharge but does  not appear to be volume overloaded. - Continue torsemide 60 mg daily. - Continue digoxin 0.0625 mg daily. - Continue Farxiga 10 mg daily.  - Continue spiro 25 mg daily - Continue midodrine 10 mg tid. - No beta blocker yet with decompensation/volume overload. - Labs today.  2. CAD/recent late presentation inferior  STEMI - LHC 05/23: 100% m RCA and 90% m LAD.  - RCA managed medically d/t completed infarct.  - Echo (5/23) EF 25%, RV moderately reduced, moderate to severe MR - Echo (10/23): EF 20-25%, RV moderately down. - s/p CABG x 2 (LIMA-LAD, SVG-Ramus INT)  (11/01/21) - Continue ASA + high intensity statin.    3. Mitral regurgitation - Moderate to severe ischemic MR on TEE.  - S/p bioprosthetic MVR 11/02/21. - On warfarin per TCTS (3 months post valve). INR followed by [facility].    4. LFLG Aortic stenosis - Moderate on echo this admit. - May also have to think about TAVR at some point if he is a candidate.   5. H/o Pericardial effusion/Acute Pericarditis  - Moderate in size on past echo, w/o tamponade. - Acute pericarditis/ adhensions noted at time of CABG. - Resolved on echo.   6. PAF - s/p MAZE w/ CABG  - Maintaining NSR on ECG today.  - Continue amiodarone 100 mg daily.  - Continue warfarin. - Check INR today & forward to Coumadin Clinic.   7. Left pleural effusion - Large on echo this admit.  - s/p thoracentesis during admit.   8. Esophageal stricture - h/o esophageal stricture (was unable to pass TEE probe transgastric prior to CABG) - Barium swallow with distal stricture.  - Dr. Rhea Belton planning EGD with dilation next week. Discussed with Dr. Gala Romney, OK from cardiac standpoint to proceed.   9. Anemia, iron-deficiency - Received feraheme inpatient. - Consider repeat iron studies in 6 weeks  Follow up in 6 weeks with APP and 3 months with Dr. Gala Romney.  Prince Rome, FNP-BC 03/31/22

## 2022-03-31 NOTE — Patient Instructions (Signed)
Thank you for coming in today  Labs were done today, if any labs are abnormal the clinic will call you No news is good news  Your physician recommends that you schedule a follow-up appointment in:  6 weeks in clinic  3-4 months in Dr. Haroldine Laws    Do the following things EVERYDAY: Weigh yourself in the morning before breakfast. Write it down and keep it in a log. Take your medicines as prescribed Eat low salt foods--Limit salt (sodium) to 2000 mg per day.  Stay as active as you can everyday Limit all fluids for the day to less than 2 liters  At the [facility], you and your health needs are our priority. As part of our continuing mission to provide you with exceptional heart care, we have created designated Provider Care Teams. These Care Teams include your primary Cardiologist (physician) and Advanced Practice Providers (APPs- Physician Assistants and Nurse Practitioners) who all work together to provide you with the care you need, when you need it.   You may see any of the following providers on your designated Care Team at your next follow up: Dr Glori Bickers Dr Loralie Champagne Dr. Roxana Hires, NP Lyda Jester, [place] [facility] [place], [place] Forestine Na, NP Audry Riles, PharmD   Please be sure to bring in all your medications bottles to every appointment.   If you have any questions or concerns before your next appointment please send [place] a message through [place] or call our office at [phone].    TO LEAVE A MESSAGE FOR THE NURSE SELECT OPTION 2, PLEASE LEAVE A MESSAGE INCLUDING: YOUR NAME DATE OF BIRTH CALL BACK NUMBER REASON FOR CALL**this is important as we prioritize the call backs  YOU WILL RECEIVE A CALL BACK THE SAME DAY AS LONG AS YOU CALL BEFORE 4:00 PM

## 2022-04-01 ENCOUNTER — Ambulatory Visit: Payer: Medicare HMO

## 2022-04-01 ENCOUNTER — Encounter (HOSPITAL_COMMUNITY): Payer: Self-pay

## 2022-04-01 ENCOUNTER — Ambulatory Visit (INDEPENDENT_AMBULATORY_CARE_PROVIDER_SITE_OTHER): Payer: Medicare HMO

## 2022-04-01 ENCOUNTER — Telehealth (HOSPITAL_COMMUNITY): Payer: Self-pay | Admitting: *Deleted

## 2022-04-01 DIAGNOSIS — Z952 Presence of prosthetic heart valve: Secondary | ICD-10-CM | POA: Diagnosis not present

## 2022-04-01 DIAGNOSIS — Z7901 Long term (current) use of anticoagulants: Secondary | ICD-10-CM

## 2022-04-01 NOTE — Patient Instructions (Signed)
Description   Called and spoke with pt and pt's wife over the phone. Instructed to take 2.5 tablets today and then START taking 2 tablets (2mg ) daily except 3 tablets (3mg ) on Mondays, Wednesdays and Fridays. - On Thursday, 10/26, HOLD Warfarin for 5 days prior to preceudre. Resume Warfarin post procedure (as long as directed by provider) and take an extra 1/2 tablet on 10/31.  Recheck INR 1 week post procedure.  [facility] [phone]

## 2022-04-01 NOTE — Telephone Encounter (Signed)
Colletta Maryland with [facility] left vm requesting verbal order for nursing. I returned call and gave verbal order.  Call back # [phone]

## 2022-04-02 ENCOUNTER — Encounter (HOSPITAL_COMMUNITY): Payer: Self-pay | Admitting: Internal Medicine

## 2022-04-02 NOTE — Progress Notes (Signed)
[name] Bowel Prep reminder: npo after midnight  For Anesthesia: PCP - Jerrell Belfast NP Cardiologist - Bensimhon, Daniel   Chest x-ray -  03/18/22 EKG - 03/31/22 Stress Test -  n/a ECHO -03/12/22 Cardiac Cath -  03/17/22 Pacemaker/ICD device last checked: n/a Sleep Study - n/a CPAP -  n/a   Blood Thinner Instructions: Coumadin Instructions:  5 day hold Last Dose:      Anesthesia review: Hx of HTN, CABG 11/01/21. Admitted 10/4-10/10 with CHF exac. and had thoracentesis while admitted. Cards seen 10/23 ok'd from cards for procedure.

## 2022-04-03 DIAGNOSIS — Z951 Presence of aortocoronary bypass graft: Secondary | ICD-10-CM | POA: Diagnosis not present

## 2022-04-03 DIAGNOSIS — M1990 Unspecified osteoarthritis, unspecified site: Secondary | ICD-10-CM | POA: Diagnosis not present

## 2022-04-03 DIAGNOSIS — D509 Iron deficiency anemia, unspecified: Secondary | ICD-10-CM | POA: Diagnosis not present

## 2022-04-03 DIAGNOSIS — Z954 Presence of other heart-valve replacement: Secondary | ICD-10-CM | POA: Diagnosis not present

## 2022-04-03 DIAGNOSIS — Z7982 Long term (current) use of aspirin: Secondary | ICD-10-CM | POA: Diagnosis not present

## 2022-04-03 DIAGNOSIS — N1832 Chronic kidney disease, stage 3b: Secondary | ICD-10-CM | POA: Diagnosis not present

## 2022-04-03 DIAGNOSIS — I252 Old myocardial infarction: Secondary | ICD-10-CM | POA: Diagnosis not present

## 2022-04-03 DIAGNOSIS — K222 Esophageal obstruction: Secondary | ICD-10-CM | POA: Diagnosis not present

## 2022-04-03 DIAGNOSIS — K219 Gastro-esophageal reflux disease without esophagitis: Secondary | ICD-10-CM | POA: Diagnosis not present

## 2022-04-03 DIAGNOSIS — H409 Unspecified glaucoma: Secondary | ICD-10-CM | POA: Diagnosis not present

## 2022-04-03 DIAGNOSIS — I350 Nonrheumatic aortic (valve) stenosis: Secondary | ICD-10-CM | POA: Diagnosis not present

## 2022-04-03 DIAGNOSIS — Z87891 Personal history of nicotine dependence: Secondary | ICD-10-CM | POA: Diagnosis not present

## 2022-04-03 DIAGNOSIS — I480 Paroxysmal atrial fibrillation: Secondary | ICD-10-CM | POA: Diagnosis not present

## 2022-04-03 DIAGNOSIS — Z9181 History of falling: Secondary | ICD-10-CM | POA: Diagnosis not present

## 2022-04-03 DIAGNOSIS — I2510 Atherosclerotic heart disease of native coronary artery without angina pectoris: Secondary | ICD-10-CM | POA: Diagnosis not present

## 2022-04-03 DIAGNOSIS — E785 Hyperlipidemia, unspecified: Secondary | ICD-10-CM | POA: Diagnosis not present

## 2022-04-03 DIAGNOSIS — R1319 Other dysphagia: Secondary | ICD-10-CM | POA: Diagnosis not present

## 2022-04-03 DIAGNOSIS — Z7901 Long term (current) use of anticoagulants: Secondary | ICD-10-CM | POA: Diagnosis not present

## 2022-04-03 DIAGNOSIS — J449 Chronic obstructive pulmonary disease, unspecified: Secondary | ICD-10-CM | POA: Diagnosis not present

## 2022-04-03 DIAGNOSIS — I5023 Acute on chronic systolic (congestive) heart failure: Secondary | ICD-10-CM | POA: Diagnosis not present

## 2022-04-05 DIAGNOSIS — I350 Nonrheumatic aortic (valve) stenosis: Secondary | ICD-10-CM | POA: Diagnosis not present

## 2022-04-05 DIAGNOSIS — H409 Unspecified glaucoma: Secondary | ICD-10-CM | POA: Diagnosis not present

## 2022-04-05 DIAGNOSIS — I5023 Acute on chronic systolic (congestive) heart failure: Secondary | ICD-10-CM | POA: Diagnosis not present

## 2022-04-05 DIAGNOSIS — M1990 Unspecified osteoarthritis, unspecified site: Secondary | ICD-10-CM | POA: Diagnosis not present

## 2022-04-05 DIAGNOSIS — N1832 Chronic kidney disease, stage 3b: Secondary | ICD-10-CM | POA: Diagnosis not present

## 2022-04-05 DIAGNOSIS — E785 Hyperlipidemia, unspecified: Secondary | ICD-10-CM | POA: Diagnosis not present

## 2022-04-05 DIAGNOSIS — Z7982 Long term (current) use of aspirin: Secondary | ICD-10-CM | POA: Diagnosis not present

## 2022-04-05 DIAGNOSIS — Z87891 Personal history of nicotine dependence: Secondary | ICD-10-CM | POA: Diagnosis not present

## 2022-04-05 DIAGNOSIS — Z951 Presence of aortocoronary bypass graft: Secondary | ICD-10-CM | POA: Diagnosis not present

## 2022-04-05 DIAGNOSIS — K222 Esophageal obstruction: Secondary | ICD-10-CM | POA: Diagnosis not present

## 2022-04-05 DIAGNOSIS — J449 Chronic obstructive pulmonary disease, unspecified: Secondary | ICD-10-CM | POA: Diagnosis not present

## 2022-04-05 DIAGNOSIS — K219 Gastro-esophageal reflux disease without esophagitis: Secondary | ICD-10-CM | POA: Diagnosis not present

## 2022-04-05 DIAGNOSIS — I480 Paroxysmal atrial fibrillation: Secondary | ICD-10-CM | POA: Diagnosis not present

## 2022-04-05 DIAGNOSIS — R1319 Other dysphagia: Secondary | ICD-10-CM | POA: Diagnosis not present

## 2022-04-05 DIAGNOSIS — Z9181 History of falling: Secondary | ICD-10-CM | POA: Diagnosis not present

## 2022-04-05 DIAGNOSIS — D509 Iron deficiency anemia, unspecified: Secondary | ICD-10-CM | POA: Diagnosis not present

## 2022-04-05 DIAGNOSIS — I252 Old myocardial infarction: Secondary | ICD-10-CM | POA: Diagnosis not present

## 2022-04-05 DIAGNOSIS — I2510 Atherosclerotic heart disease of native coronary artery without angina pectoris: Secondary | ICD-10-CM | POA: Diagnosis not present

## 2022-04-05 DIAGNOSIS — Z7901 Long term (current) use of anticoagulants: Secondary | ICD-10-CM | POA: Diagnosis not present

## 2022-04-05 DIAGNOSIS — Z954 Presence of other heart-valve replacement: Secondary | ICD-10-CM | POA: Diagnosis not present

## 2022-04-08 ENCOUNTER — Ambulatory Visit (HOSPITAL_COMMUNITY)
Admission: RE | Admit: 2022-04-08 | Discharge: 2022-04-08 | Disposition: A | Discharge status: Home / Self Care | Payer: Medicare HMO | Attending: Internal Medicine | Admitting: Internal Medicine

## 2022-04-08 ENCOUNTER — Encounter (HOSPITAL_COMMUNITY): Payer: Self-pay | Admitting: Internal Medicine

## 2022-04-08 ENCOUNTER — Ambulatory Visit (HOSPITAL_BASED_OUTPATIENT_CLINIC_OR_DEPARTMENT_OTHER): Payer: Medicare HMO | Admitting: Physician Assistant

## 2022-04-08 ENCOUNTER — Encounter (HOSPITAL_COMMUNITY)
Admission: RE | Disposition: A | Discharge status: Home / Self Care | Payer: Self-pay | Source: Home / Self Care | Attending: Internal Medicine

## 2022-04-08 ENCOUNTER — Ambulatory Visit (HOSPITAL_COMMUNITY): Payer: Medicare HMO | Admitting: Physician Assistant

## 2022-04-08 DIAGNOSIS — K219 Gastro-esophageal reflux disease without esophagitis: Secondary | ICD-10-CM | POA: Diagnosis not present

## 2022-04-08 DIAGNOSIS — Z87891 Personal history of nicotine dependence: Secondary | ICD-10-CM | POA: Diagnosis not present

## 2022-04-08 DIAGNOSIS — I252 Old myocardial infarction: Secondary | ICD-10-CM | POA: Diagnosis not present

## 2022-04-08 DIAGNOSIS — R933 Abnormal findings on diagnostic imaging of other parts of digestive tract: Secondary | ICD-10-CM

## 2022-04-08 DIAGNOSIS — K222 Esophageal obstruction: Secondary | ICD-10-CM

## 2022-04-08 DIAGNOSIS — K449 Diaphragmatic hernia without obstruction or gangrene: Secondary | ICD-10-CM | POA: Insufficient documentation

## 2022-04-08 DIAGNOSIS — R1310 Dysphagia, unspecified: Secondary | ICD-10-CM | POA: Insufficient documentation

## 2022-04-08 DIAGNOSIS — I509 Heart failure, unspecified: Secondary | ICD-10-CM | POA: Diagnosis not present

## 2022-04-08 DIAGNOSIS — Z951 Presence of aortocoronary bypass graft: Secondary | ICD-10-CM | POA: Diagnosis not present

## 2022-04-08 DIAGNOSIS — R1319 Other dysphagia: Secondary | ICD-10-CM

## 2022-04-08 HISTORY — PX: ESOPHAGOGASTRODUODENOSCOPY (EGD) WITH PROPOFOL: SHX5813

## 2022-04-08 SURGERY — ESOPHAGOGASTRODUODENOSCOPY (EGD) WITH PROPOFOL
Anesthesia: Monitor Anesthesia Care

## 2022-04-08 MED ORDER — LACTATED RINGERS IV SOLN
INTRAVENOUS | Status: AC | PRN
  Administered 2022-04-08: 1000 mL via INTRAVENOUS

## 2022-04-08 MED ORDER — PHENYLEPHRINE HCL (PRESSORS) 10 MG/ML IV SOLN
INTRAVENOUS | Status: DC | PRN
  Administered 2022-04-08: 100 ug via INTRAVENOUS

## 2022-04-08 MED ORDER — PROPOFOL 10 MG/ML IV BOLUS
INTRAVENOUS | Status: DC | PRN
  Administered 2022-04-08 (×3): 10 mg via INTRAVENOUS

## 2022-04-08 MED ORDER — SODIUM CHLORIDE 0.9 % IV SOLN: INTRAVENOUS | Status: DC

## 2022-04-08 MED ORDER — PROPOFOL 500 MG/50ML IV EMUL
INTRAVENOUS | Status: DC | PRN
  Administered 2022-04-08: 110 ug/kg/min via INTRAVENOUS

## 2022-04-08 MED ORDER — LACTATED RINGERS IV SOLN: INTRAVENOUS | Status: DC

## 2022-04-08 MED ORDER — LIDOCAINE HCL 1 % IJ SOLN
INTRAMUSCULAR | Status: DC | PRN
  Administered 2022-04-08: 50 mg via INTRADERMAL

## 2022-04-08 MED ORDER — PHENYLEPHRINE HCL-NACL 20-0.9 MG/250ML-% IV SOLN
INTRAVENOUS | Status: DC | PRN
  Administered 2022-04-08: 40 ug/min via INTRAVENOUS

## 2022-04-08 SURGICAL SUPPLY — 15 items

## 2022-04-08 NOTE — Anesthesia Postprocedure Evaluation (Signed)
Anesthesia Post Note  Patient: Keith Jenkins  Procedure(s) Performed: ESOPHAGOGASTRODUODENOSCOPY (EGD) WITH PROPOFOL Balloon dilation wire-guided     Patient location during evaluation: PACU Anesthesia Type: MAC Level of consciousness: awake and alert Pain management: pain level controlled Vital Signs Assessment: post-procedure vital signs reviewed and stable Respiratory status: spontaneous breathing, nonlabored ventilation, respiratory function stable and patient connected to nasal cannula oxygen Cardiovascular status: stable and blood pressure returned to baseline Postop Assessment: no apparent nausea or vomiting Anesthetic complications: no   No notable events documented.  Last Vitals:  Vitals:   04/08/22 0912 04/08/22 0922  BP: (!) 91/57 95/60  Pulse: (!) 55 (!) 55  Resp: 18 (!) 21  Temp:    SpO2: 98% 96%    Last Pain:  Vitals:   04/08/22 0922  TempSrc:   PainSc: 0-No pain                 Effie Berkshire

## 2022-04-08 NOTE — Transfer of Care (Signed)
Immediate Anesthesia Transfer of Care Note  Patient: Keith Jenkins  Procedure(s) Performed: ESOPHAGOGASTRODUODENOSCOPY (EGD) WITH PROPOFOL Balloon dilation wire-guided  Patient Location: PACU and Endoscopy Unit  Anesthesia Type:MAC  Level of Consciousness: oriented, drowsy and patient cooperative  Airway & Oxygen Therapy: Patient Spontanous Breathing and Patient connected to face mask oxygen  Post-op Assessment: Report given to RN and Post -op Vital signs reviewed and stable  Post vital signs: Reviewed and stable  Last Vitals:  Vitals Value Taken Time  BP 99/62 04/08/22 0856  Temp 36.3 C 04/08/22 0852  Pulse 55 04/08/22 0857  Resp 11 04/08/22 0857  SpO2 100 % 04/08/22 0857  Vitals shown include unvalidated device data.  Last Pain:  Vitals:   04/08/22 0852  TempSrc: Tympanic  PainSc: Asleep         Complications: No notable events documented.

## 2022-04-08 NOTE — Op Note (Signed)
[facility] Patient Name: Keith Jenkins Procedure Date: 04/08/2022 MRN: [id number] Attending MD: Beverley Fiedler , MD, [id number] Date of Birth: [birth year] CSN: [id number] Age: 76 Admit Type: Outpatient Procedure:                Upper GI endoscopy Indications:              Dysphagia, Gastro-esophageal reflux disease,                            Abnormal cine-esophagram Providers:                Carie Caddy. Rhea Belton, MD, Fransisca Connors, Irene Shipper,                            Technician, Marlena Clipper, CRNA Referring MD:             [name], NP Medicines:                Monitored Anesthesia Care Complications:            No immediate complications. Estimated Blood Loss:     Estimated blood loss was minimal. Procedure:                Pre-Anesthesia Assessment:                           - Prior to the procedure, a History and Physical                            was performed, and patient medications and                            allergies were reviewed. The patient's tolerance of                            previous anesthesia was also reviewed. The risks                            and benefits of the procedure and the sedation                            options and risks were discussed with the patient.                            All questions were answered, and informed consent                            was obtained. Prior Anticoagulants: The patient has                            taken Coumadin (warfarin), last dose was 5 days                            prior to procedure. ASA Grade Assessment: III - A  patient with severe systemic disease. After                            reviewing the risks and benefits, the patient was                            deemed in satisfactory condition to undergo the                            procedure.                           After obtaining informed consent, the endoscope was                             passed under direct vision. Throughout the                            procedure, the patient's blood pressure, pulse, and                            oxygen saturations were monitored continuously. The                            [id number]-H190 ([id number]) Olympus endoscope was introduced                            through the mouth, and advanced to the fourth part                            of duodenum. The upper GI endoscopy was                            accomplished without difficulty. The patient                            tolerated the procedure well. Scope In: Scope Out: Findings:      Two benign-appearing, intrinsic severe (stenosis; an endoscope cannot       pass) stenoses were found 40 to 41 cm from the incisors. The narrowest       stenosis measured 7 mm (inner diameter) x 1 cm (in length). The stenoses       were traversed after dilation. A TTS dilator was passed through the       scope. Dilation with a 03-20-11 mm x 5.5 cm CRE balloon dilator was       performed to 11 mm. The dilation site was examined and showed moderate       mucosal disruption and moderate improvement in luminal narrowing. There       was self-limited bleeding which stopped during observation.      A 2 cm hiatal hernia was present.      The entire examined stomach was normal.      The examined duodenum was normal. Impression:               - Benign-appearing esophageal stenoses. Dilated to  11 mm with balloon.                           - 2 cm hiatal hernia.                           - Normal stomach.                           - Normal examined duodenum.                           - No specimens collected. Moderate Sedation:      N/A Recommendation:           - Patient has a contact number available for                            emergencies. The signs and symptoms of potential                            delayed complications were discussed with the                            patient.  Return to normal activities tomorrow.                            Written discharge instructions were provided to the                            patient.                           - Post-dilation diet precautions and then advance                            diet as tolerated. Given still restricted size of                            distal esophagus I would avoid tough meats and dry                            breads until after next dilation.                           - Continue present medications.                           - Resume Coumadin (warfarin) at prior dose                            tomorrow. Refer to Coumadin Clinic for further                            adjustment of therapy.                           -  Repeat upper endoscopy in 1-2 months in                            outpatient hospital setting for retreatment. Will                            need to hold warfarin 5 days before exam as we did                            today. Procedure Code(s):        --- Professional ---                           [phone], Esophagogastroduodenoscopy, flexible,                            transoral; with transendoscopic balloon dilation of                            esophagus (less than 30 mm diameter) Diagnosis Code(s):        --- Professional ---                           K22.2, Esophageal obstruction                           K44.9, Diaphragmatic hernia without obstruction or                            gangrene                           R13.10, Dysphagia, unspecified                           K21.9, Gastro-esophageal reflux disease without                            esophagitis                           R93.3, Abnormal findings on diagnostic imaging of                            other parts of digestive tract CPT copyright 2022 American [employer] Association. All rights reserved. The codes documented in this report are preliminary and upon coder review may  be revised to meet current  compliance requirements. Jerene Bears, MD 04/08/2022 8:54:01 AM This report has been signed electronically. Number of Addenda: 0

## 2022-04-08 NOTE — Anesthesia Preprocedure Evaluation (Addendum)
Anesthesia Evaluation  Patient identified by MRN, date of birth, ID band Patient awake    Reviewed: Allergy & Precautions, NPO status , Patient's Chart, lab work & pertinent test results  Airway Mallampati: II  TM Distance: >3 FB Neck ROM: Full    Dental  (+) Edentulous Upper, Edentulous Lower   Pulmonary former smoker,    breath sounds clear to auscultation       Cardiovascular + Past MI, + CABG and +CHF   Rhythm:Regular Rate:Normal  - s/p MVR  Echo: 1. Left ventricular ejection fraction, by estimation, is 20 to 25%. The  left ventricle has severely decreased function. The left ventricle  demonstrates global hypokinesis. The left ventricular internal cavity size  was mildly to moderately dilated. Left  ventricular diastolic parameters are indeterminate.  2. Right ventricular systolic function is moderately reduced. The right  ventricular size is mildly enlarged. There is normal pulmonary artery  systolic pressure.  3. Left atrial size was moderately dilated.  4. Right atrial size was moderately dilated.  5. Large pleural effusion in the left lateral region.  6. The mitral valve has been repaired/replaced. No evidence of mitral  valve regurgitation. Mild mitral stenosis. The mean mitral valve gradient  is 4.0 mmHg. There is a MITRAL MITRIS RESILIA 27 present in the mitral  position. Procedure Date: 11/01/21.  7. Suspect component of low gradient AS. Likely moderate AS.  Dimensionless index = 0.31. The aortic valve is tricuspid. There is  moderate calcification of the aortic valve. Aortic valve regurgitation is  mild. Moderate aortic valve stenosis. Aortic  regurgitation PHT measures 497 msec. Aortic valve area, by VTI measures  1.27 cm. Aortic valve mean gradient measures 13.2 mmHg. Aortic valve Vmax  measures 2.52 m/s.  8. Aortic dilatation noted. There is mild dilatation of the ascending  aorta, measuring 41 mm.   9. The inferior vena cava is dilated in size with >50% respiratory  variability, suggesting right atrial pressure of 8 mmHg.    Neuro/Psych negative neurological ROS  negative psych ROS   GI/Hepatic negative GI ROS, Neg liver ROS,   Endo/Other  negative endocrine ROS  Renal/GU Renal disease     Musculoskeletal  (+) Arthritis ,   Abdominal Normal abdominal exam  (+)   Peds  Hematology negative hematology ROS (+)   Anesthesia Other Findings   Reproductive/Obstetrics                            Anesthesia Physical Anesthesia Plan  ASA: 4  Anesthesia Plan: MAC   Post-op Pain Management:    Induction: Intravenous  PONV Risk Score and Plan: 0 and Propofol infusion  Airway Management Planned: Natural Airway and Simple Face Mask  Additional Equipment: None  Intra-op Plan:   Post-operative Plan:   Informed Consent: I have reviewed the patients History and Physical, chart, labs and discussed the procedure including the risks, benefits and alternatives for the proposed anesthesia with the patient or authorized representative who has indicated his/her understanding and acceptance.       Plan Discussed with: CRNA  Anesthesia Plan Comments:        Anesthesia Quick Evaluation
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
## 2022-04-08 NOTE — Discharge Instructions (Signed)
YOU HAD AN ENDOSCOPIC PROCEDURE TODAY: Refer to the procedure report and other information in the discharge instructions given to you for any specific questions about what was found during the examination. If this information does not answer your questions, please call [facility] office at [phone] to clarify.  ° °YOU SHOULD EXPECT: Some feelings of bloating in the abdomen. Passage of more gas than usual. Walking can help get rid of the air that was put into your GI tract during the procedure and reduce the bloating. If you had a lower endoscopy (such as a colonoscopy or flexible sigmoidoscopy) you may notice spotting of blood in your stool or on the toilet paper. Some abdominal soreness may be present for a day or two, also. ° °DIET: Your first meal following the procedure should be a light meal and then it is ok to progress to your normal diet. A half-sandwich or bowl of soup is an example of a good first meal. Heavy or fried foods are harder to digest and may make you feel nauseous or bloated. Drink plenty of fluids but you should avoid alcoholic beverages for 24 hours. If you had a esophageal dilation, please see attached instructions for diet.   ° °ACTIVITY: Your care partner should take you home directly after the procedure. You should plan to take it easy, moving slowly for the rest of the day. You can resume normal activity the day after the procedure however YOU SHOULD NOT DRIVE, use power tools, machinery or perform tasks that involve climbing or major physical exertion for 24 hours (because of the sedation medicines used during the test).  ° °SYMPTOMS TO REPORT IMMEDIATELY: °A gastroenterologist can be reached at any hour. Please call [phone]  for any of the following symptoms:  °Following lower endoscopy (colonoscopy, flexible sigmoidoscopy) °Excessive amounts of blood in the stool  °Significant tenderness, worsening of abdominal pains  °Swelling of the abdomen that is new, acute  °Fever of 100° or  higher  °Following upper endoscopy (EGD, EUS, ERCP, esophageal dilation) °Vomiting of blood or coffee ground material  °New, significant abdominal pain  °New, significant chest pain or pain under the shoulder blades  °Painful or persistently difficult swallowing  °New shortness of breath  °Black, tarry-looking or red, bloody stools ° °FOLLOW UP:  °If any biopsies were taken you will be contacted by phone or by letter within the next 1-3 weeks. Call [phone]  if you have not heard about the biopsies in 3 weeks.  °Please also call with any specific questions about appointments or follow up tests. ° °

## 2022-04-09 ENCOUNTER — Encounter (HOSPITAL_COMMUNITY): Payer: Self-pay | Admitting: Internal Medicine

## 2022-04-10 DIAGNOSIS — I350 Nonrheumatic aortic (valve) stenosis: Secondary | ICD-10-CM | POA: Diagnosis not present

## 2022-04-10 DIAGNOSIS — Z9181 History of falling: Secondary | ICD-10-CM | POA: Diagnosis not present

## 2022-04-10 DIAGNOSIS — M1990 Unspecified osteoarthritis, unspecified site: Secondary | ICD-10-CM | POA: Diagnosis not present

## 2022-04-10 DIAGNOSIS — E785 Hyperlipidemia, unspecified: Secondary | ICD-10-CM | POA: Diagnosis not present

## 2022-04-10 DIAGNOSIS — I5023 Acute on chronic systolic (congestive) heart failure: Secondary | ICD-10-CM | POA: Diagnosis not present

## 2022-04-10 DIAGNOSIS — N1832 Chronic kidney disease, stage 3b: Secondary | ICD-10-CM | POA: Diagnosis not present

## 2022-04-10 DIAGNOSIS — D509 Iron deficiency anemia, unspecified: Secondary | ICD-10-CM | POA: Diagnosis not present

## 2022-04-10 DIAGNOSIS — Z7901 Long term (current) use of anticoagulants: Secondary | ICD-10-CM | POA: Diagnosis not present

## 2022-04-10 DIAGNOSIS — K222 Esophageal obstruction: Secondary | ICD-10-CM | POA: Diagnosis not present

## 2022-04-10 DIAGNOSIS — Z87891 Personal history of nicotine dependence: Secondary | ICD-10-CM | POA: Diagnosis not present

## 2022-04-10 DIAGNOSIS — I480 Paroxysmal atrial fibrillation: Secondary | ICD-10-CM | POA: Diagnosis not present

## 2022-04-10 DIAGNOSIS — H409 Unspecified glaucoma: Secondary | ICD-10-CM | POA: Diagnosis not present

## 2022-04-10 DIAGNOSIS — I252 Old myocardial infarction: Secondary | ICD-10-CM | POA: Diagnosis not present

## 2022-04-10 DIAGNOSIS — Z7982 Long term (current) use of aspirin: Secondary | ICD-10-CM | POA: Diagnosis not present

## 2022-04-10 DIAGNOSIS — R1319 Other dysphagia: Secondary | ICD-10-CM | POA: Diagnosis not present

## 2022-04-10 DIAGNOSIS — Z954 Presence of other heart-valve replacement: Secondary | ICD-10-CM | POA: Diagnosis not present

## 2022-04-10 DIAGNOSIS — K219 Gastro-esophageal reflux disease without esophagitis: Secondary | ICD-10-CM | POA: Diagnosis not present

## 2022-04-10 DIAGNOSIS — J449 Chronic obstructive pulmonary disease, unspecified: Secondary | ICD-10-CM | POA: Diagnosis not present

## 2022-04-10 DIAGNOSIS — I2510 Atherosclerotic heart disease of native coronary artery without angina pectoris: Secondary | ICD-10-CM | POA: Diagnosis not present

## 2022-04-10 DIAGNOSIS — Z951 Presence of aortocoronary bypass graft: Secondary | ICD-10-CM | POA: Diagnosis not present

## 2022-04-14 ENCOUNTER — Other Ambulatory Visit: Payer: Self-pay | Admitting: Nurse Practitioner

## 2022-04-14 ENCOUNTER — Encounter: Payer: Self-pay | Admitting: Nurse Practitioner

## 2022-04-14 ENCOUNTER — Ambulatory Visit (INDEPENDENT_AMBULATORY_CARE_PROVIDER_SITE_OTHER): Payer: Medicare HMO | Admitting: Nurse Practitioner

## 2022-04-14 VITALS — BP 112/64 | HR 61 | Temp 96.9°F | Ht 73.0 in | Wt 188.0 lb

## 2022-04-14 DIAGNOSIS — R0600 Dyspnea, unspecified: Secondary | ICD-10-CM

## 2022-04-14 DIAGNOSIS — F5101 Primary insomnia: Secondary | ICD-10-CM

## 2022-04-14 DIAGNOSIS — N1832 Chronic kidney disease, stage 3b: Secondary | ICD-10-CM

## 2022-04-14 DIAGNOSIS — I5022 Chronic systolic (congestive) heart failure: Secondary | ICD-10-CM

## 2022-04-14 DIAGNOSIS — Z952 Presence of prosthetic heart valve: Secondary | ICD-10-CM

## 2022-04-14 DIAGNOSIS — Z79899 Other long term (current) drug therapy: Secondary | ICD-10-CM

## 2022-04-14 DIAGNOSIS — E782 Mixed hyperlipidemia: Secondary | ICD-10-CM | POA: Diagnosis not present

## 2022-04-14 DIAGNOSIS — I700 Atherosclerosis of aorta: Secondary | ICD-10-CM | POA: Diagnosis not present

## 2022-04-14 DIAGNOSIS — J680 Bronchitis and pneumonitis due to chemicals, gases, fumes and vapors: Secondary | ICD-10-CM

## 2022-04-14 DIAGNOSIS — R7303 Prediabetes: Secondary | ICD-10-CM

## 2022-04-14 DIAGNOSIS — D638 Anemia in other chronic diseases classified elsewhere: Secondary | ICD-10-CM

## 2022-04-14 DIAGNOSIS — R69 Illness, unspecified: Secondary | ICD-10-CM | POA: Diagnosis not present

## 2022-04-14 DIAGNOSIS — Z23 Encounter for immunization: Secondary | ICD-10-CM

## 2022-04-14 DIAGNOSIS — G2581 Restless legs syndrome: Secondary | ICD-10-CM

## 2022-04-14 MED ORDER — TRELEGY ELLIPTA 100-62.5-25 MCG/ACT IN AEPB: 1.0000 | INHALATION_SPRAY | Freq: Every day | RESPIRATORY_TRACT | 2 refills | Status: DC

## 2022-04-14 MED ORDER — ALBUTEROL SULFATE HFA 108 (90 BASE) MCG/ACT IN AERS: 2.0000 | INHALATION_SPRAY | Freq: Four times a day (QID) | RESPIRATORY_TRACT | 0 refills | Status: DC | PRN

## 2022-04-14 MED ORDER — GABAPENTIN 600 MG PO TABS: 600.0000 mg | ORAL_TABLET | Freq: Every day | ORAL | 1 refills | Status: DC

## 2022-04-14 NOTE — Patient Instructions (Addendum)
COVID-19 booster given in office today Iron rich diet Avoid naps and caffeine Resume Gabapentin 600 mg at bedtime for restless leg syndrome Debrox drops to ears as needed for ear wax Follow-up in 4 weeks insomnia and restless leg syndrome    Iron-Rich Diet  Iron is a mineral that helps your body produce hemoglobin. Hemoglobin is a protein in red blood cells that carries oxygen to your body's tissues. Eating too little iron may cause you to feel weak and tired, and it can increase your risk of infection. Iron is naturally found in many foods, and many foods have iron added to them (are iron-fortified). You may need to follow an iron-rich diet if you do not have enough iron in your body due to certain medical conditions. The amount of iron that you need each day depends on your age, your sex, and any medical conditions you have. Follow instructions from your health care provider or a dietitian about how much iron you should eat each day. What are tips for following this plan? Reading food labels Check food labels to see how many milligrams (mg) of iron are in each serving. Cooking Cook foods in pots and pans that are made from iron. Take these steps to make it easier for your body to absorb iron from certain foods: Soak beans overnight before cooking. Soak whole grains overnight and drain them before using. Ferment flours before baking, such as by using yeast in bread dough. Meal planning When you eat foods that contain iron, you should eat them with foods that are high in vitamin C. These include oranges, peppers, tomatoes, potatoes, and mangoes. Vitamin C helps your body absorb iron. Certain foods and drinks prevent your body from absorbing iron properly. Avoid eating these foods in the same meal as iron-rich foods or with iron supplements. These foods include: Coffee, black tea, and red wine. Milk, dairy products, and foods that are high in calcium. Beans and soybeans. Whole  grains. General information Take iron supplements only as told by your health care provider. An overdose of iron can be life-threatening. If you were prescribed iron supplements, take them with orange juice or a vitamin C supplement. When you eat iron-fortified foods or take an iron supplement, you should also eat foods that naturally contain iron, such as meat, poultry, and fish. Eating naturally iron-rich foods helps your body absorb the iron that is added to other foods or contained in a supplement. Iron from animal sources is better absorbed than iron from plant sources. What foods should I eat? Fruits Prunes. Raisins. Eat fruits high in vitamin C, such as oranges, grapefruits, and strawberries, with iron-rich foods. Vegetables Spinach (cooked). Green peas. Broccoli. Fermented vegetables. Eat vegetables high in vitamin C, such as leafy greens, potatoes, bell peppers, and tomatoes, with iron-rich foods. Grains Iron-fortified breakfast cereal. Iron-fortified whole-wheat bread. Enriched rice. Sprouted grains. Meats and other proteins Beef liver. Beef. [place]. Chicken. Oysters. Shrimp. Tuna. Sardines. Chickpeas. Nuts. Tofu. Pumpkin seeds. Beverages Tomato juice. Fresh orange juice. Prune juice. Hibiscus tea. Iron-fortified instant breakfast shakes. Sweets and desserts Blackstrap molasses. Seasonings and condiments Tahini. Fermented soy sauce. Other foods Wheat germ. The items listed above may not be a complete list of recommended foods and beverages. Contact a dietitian for more information. What foods should I limit? These are foods that should be limited while eating iron-rich foods as they can reduce the absorption of iron in your body. Grains Whole grains. Bran cereal. Bran flour. Meats and other proteins Soybeans.  Products made from soy protein. Black beans. Lentils. Mung beans. Split peas. Dairy Milk. Cream. Cheese. Yogurt. Cottage cheese. Beverages Coffee. Black tea. Red  wine. Sweets and desserts Cocoa. Chocolate. Ice cream. Seasonings and condiments Basil. Oregano. Large amounts of parsley. The items listed above may not be a complete list of foods and beverages you should limit. Contact a dietitian for more information. Summary Iron is a mineral that helps your body produce hemoglobin. Hemoglobin is a protein in red blood cells that carries oxygen to your body's tissues. Iron is naturally found in many foods, and many foods have iron added to them (are iron-fortified). When you eat foods that contain iron, you should eat them with foods that are high in vitamin C. Vitamin C helps your body absorb iron. Certain foods and drinks prevent your body from absorbing iron properly, such as whole grains and dairy products. You should avoid eating these foods in the same meal as iron-rich foods or with iron supplements. This information is not intended to replace advice given to you by your health care provider. Make sure you discuss any questions you have with your health care provider. Document Revised: 05/07/2020 Document Reviewed: 05/07/2020 Elsevier Patient Education  2023 Elsevier Inc.  Quality Sleep Information, Adult Quality sleep is important for your mental and physical health. It also improves your quality of life. Quality sleep means you: Are asleep for most of the time you are in bed. Fall asleep within 30 minutes. Wake up no more than once a night. Are awake for no longer than 20 minutes if you do wake up during the night. Most adults need 7-8 hours of quality sleep each night. How can poor sleep affect me? If you do not get enough quality sleep, you may have: Mood swings. Daytime sleepiness. Decreased alertness, reaction time, and concentration. Sleep disorders, such as insomnia and sleep apnea. Difficulty with: Solving problems. Coping with stress. Paying attention. These issues may affect your performance and productivity at work, school,  and home. Lack of sleep may also put you at higher risk for accidents, suicide, and risky behaviors. If you do not get quality sleep, you may also be at higher risk for several health problems, including: Infections. Type 2 diabetes. Heart disease. High blood pressure. Obesity. Worsening of long-term conditions, like arthritis, kidney disease, depression, Parkinson's disease, and epilepsy. What actions can I take to get more quality sleep? Sleep schedule and routine Stick to a sleep schedule. Go to sleep and wake up at about the same time each day. Do not try to sleep less on weekdays and make up for lost sleep on weekends. This does not work. Limit naps during the day to 30 minutes or less. Do not take naps in the late afternoon. Make time to relax before bed. Reading, listening to music, or taking a hot bath promotes quality sleep. Make your bedroom a place that promotes quality sleep. Keep your bedroom dark, quiet, and at a comfortable room temperature. Make sure your bed is comfortable. Avoid using electronic devices that give off bright blue light for 30 minutes before bedtime. Your brain perceives bright blue light as sunlight. This includes television, phones, and computers. If you are lying awake in bed for longer than 20 minutes, get up and do a relaxing activity until you feel sleepy. Lifestyle     Try to get at least 30 minutes of exercise on most days. Do not exercise 2-3 hours before going to bed. Do not use any products  that contain nicotine or tobacco. These products include cigarettes, chewing tobacco, and vaping devices, such as e-cigarettes. If you need help quitting, ask your health care provider. Do not drink caffeinated beverages for at least 8 hours before going to bed. Coffee, tea, and some sodas contain caffeine. Do not drink alcohol or eat large meals close to bedtime. Try to get at least 30 minutes of sunlight every day. Morning sunlight is best. Medical  concerns Work with your health care provider to treat medical conditions that may affect sleeping, such as: Nasal obstruction. Snoring. Sleep apnea and other sleep disorders. Talk to your health care provider if you think any of your prescription medicines may cause you to have difficulty falling or staying asleep. If you have sleep problems, talk with a sleep consultant. If you think you have a sleep disorder, talk with your health care provider about getting evaluated by a specialist. Where to find more information Sleep Foundation: sleepfoundation.org American Academy of Sleep Medicine: aasm.org Centers for Disease Control and Prevention (CDC): TonerPromos.no Contact a health care provider if: You have trouble getting to sleep or staying asleep. You often wake up very early in the morning and cannot get back to sleep. You have daytime sleepiness. You have daytime sleep attacks of suddenly falling asleep and sudden muscle weakness (narcolepsy). You have a tingling sensation in your legs with a strong urge to move your legs (restless legs syndrome). You stop breathing briefly during sleep (sleep apnea). You think you have a sleep disorder or are taking a medicine that is affecting your quality of sleep. Summary Most adults need 7-8 hours of quality sleep each night. Getting enough quality sleep is important for your mental and physical health. Make your bedroom a place that promotes quality sleep, and avoid things that may cause you to have poor sleep, such as alcohol, caffeine, smoking, or large meals. Talk to your health care provider if you have trouble falling asleep or staying asleep. This information is not intended to replace advice given to you by your health care provider. Make sure you discuss any questions you have with your health care provider. Document Revised: 09/18/2021 Document Reviewed: 09/18/2021 Elsevier Patient Education  2023 Elsevier Inc.  Restless Legs  Syndrome Restless legs syndrome is a condition that causes uncomfortable feelings or sensations in the legs, especially while sitting or lying down. The sensations usually cause an overwhelming urge to move the legs. The arms can also sometimes be affected. The condition can range from mild to severe. The symptoms often interfere with a person's ability to sleep. What are the causes? The cause of this condition is not known. What increases the risk? The following factors may make you more likely to develop this condition: Being older than 50. Pregnancy. Being a woman. In general, the condition is more common in women than in men. A family history of the condition. Having iron deficiency. Overuse of caffeine, nicotine, or alcohol. Certain medical conditions, such as kidney disease, Parkinson's disease, or nerve damage. Certain medicines, such as those for high blood pressure, nausea, colds, allergies, depression, and some heart conditions. What are the signs or symptoms? The main symptom of this condition is uncomfortable sensations in the legs, such as: Pulling. Tingling. Prickling. Throbbing. Crawling. Burning. Usually, the sensations: Affect both sides of the body. Are worse when you sit or lie down. Are worse at night. These may make it difficult to fall asleep. Make you have a strong urge to move your legs. Are  temporarily relieved by moving your legs or standing. The arms can also be affected, but this is rare. People who have this condition often have tiredness during the day because of their lack of sleep at night. How is this diagnosed? This condition may be diagnosed based on: Your symptoms. Blood tests. In some cases, you may be monitored in a sleep lab by a specialist (a sleep study). This can detect any disruptions in your sleep. How is this treated? This condition is treated by managing the symptoms. This may include: Lifestyle changes, such as exercising, using  relaxation techniques, and avoiding caffeine, alcohol, or tobacco. Iron supplements. Medicines. Parkinson's medications may be tried first. Anti-seizure medications can also be helpful. Follow these instructions at home: General instructions Take over-the-counter and prescription medicines only as told by your health care provider. Use methods to help relieve the uncomfortable sensations, such as: Massaging your legs. Walking or stretching. Taking a cold or hot bath. Keep all follow-up visits. This is important. Lifestyle     Practice good sleep habits. For example, go to bed and get up at the same time every day. Most adults should get 7-9 hours of sleep each night. Exercise regularly. Try to get at least 30 minutes of exercise most days of the week. Practice ways of relaxing, such as yoga or meditation. Avoid caffeine and alcohol. Do not use any products that contain nicotine or tobacco. These products include cigarettes, chewing tobacco, and vaping devices, such as e-cigarettes. If you need help quitting, ask your health care provider. Where to find more information [employer] Neurological Disorders and Stroke: [employer].uk Contact a health care provider if: Your symptoms get worse or they do not improve with treatment. Summary Restless legs syndrome is a condition that causes uncomfortable feelings or sensations in the legs, especially while sitting or lying down. The symptoms often interfere with your ability to sleep. This condition is treated by managing the symptoms. You may need to make lifestyle changes or take medicines. This information is not intended to replace advice given to you by your health care provider. Make sure you discuss any questions you have with your health care provider. Document Revised: 01/06/2021 Document Reviewed: 01/06/2021 Elsevier Patient Education  2023 Elsevier Inc.  Follow-up in 4-weeks for insomnia   SARS-CoV-2 Virus  (COVID-19) Vaccine Injection (Pfizer-BioNTech) What is this medication? COVID-19 VACCINE (koh-vid 19 vak SEEN) reduces the risk of COVID-19. It does not treat COVID-19. It is still possible to get COVID-19 after receiving this vaccine, but the symptoms may be less severe or not last as long. It works by helping your immune system learn how to fight off a future infection. This medicine may be used for other purposes; ask your health care provider or pharmacist if you have questions. COMMON BRAND NAME(S): COMIRNATY COVID-19, Pfizer-BioNTech COVID-19, Pfizer-BioNTech COVID-19 Bivalent What should I tell my care team before I take this medication? They need to know if you have any of these conditions: Any allergies Bleeding disorder Fever or infection Immune system problems Recent or upcoming vaccine including previous COVID-19 vaccine An unusual or allergic reaction to COVID-19 vaccine, other medications, foods, dyes, or preservatives Pregnant or trying to get pregnant Breast-feeding How should I use this medication? This vaccine is injected into a muscle. It is given by your care team. A copy of the Fact Sheet for Recipients and Caregivers will be given before each vaccination. Be sure to read this information carefully each time. This sheet may change frequently.  Talk to your care team about the use of this vaccine in children. While it may be given to children as young as 6 months for selected conditions, precautions do apply. Overdosage: If you think you have taken too much of this medicine contact a poison control center or emergency room at once. NOTE: This medicine is only for you. Do not share this medicine with others. What if I miss a dose? It is important not to miss your dose. Call your care team if you are unable to keep an appointment. What may interact with this medication? This medication may interact with the following: Certain medications that thin your blood Chemotherapy or  radiation therapy Medications that lower your chance of fighting infection Steroid medications, such as prednisone or cortisone This list may not describe all possible interactions. Give your health care provider a list of all the medicines, herbs, non-prescription drugs, or dietary supplements you use. Also tell them if you smoke, drink alcohol, or use illegal drugs. Some items may interact with your medicine. What should I watch for while using this medication? Visit your care team regularly. Heart muscle inflammation has been reported after receiving this vaccine. It is not known whether the vaccine causes the heart inflammation. Talk to your care team right away if you have unusual weakness or fatigue, shortness of breath, chest pain, fast or irregular heartbeat, dizziness, or swelling of the ankles, feet, or hands. The Centers for Disease Control (CDC) is monitoring these reports to see if there is any relationship to this vaccine. This vaccine, like all vaccines, may not fully protect everyone. Continue to follow all guidelines to prevent exposure. What side effects may I notice from receiving this medication? Side effects that you should report to your care team as soon as possible: Allergic reactions--skin rash, itching, hives, swelling of the face, lips, tongue, or throat Heart muscle inflammation--unusual weakness or fatigue, shortness of breath, chest pain, fast or irregular heartbeat, dizziness, swelling of the ankles, feet, or hands Side effects that usually do not require medical attention (report these to your care team if they continue or are bothersome): Chills Fatigue Fever Headache Joint pain Muscle pain Nausea Pain, redness, or irritation at injection site Swollen lymph nodes Vomiting This list may not describe all possible side effects. Call your doctor for medical advice about side effects. You may report side effects to FDA at 1-800-FDA-1088. Where should I keep my  medication? This vaccine is only given by your care team. It will not be stored at home. NOTE: This sheet is a summary. It may not cover all possible information. If you have questions about this medicine, talk to your doctor, pharmacist, or health care provider.  2023 Elsevier/Gold Standard (2021-09-27 00:00:00)
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
## 2022-04-15 ENCOUNTER — Ambulatory Visit: Payer: Medicare HMO | Attending: Cardiology

## 2022-04-15 DIAGNOSIS — Z952 Presence of prosthetic heart valve: Secondary | ICD-10-CM

## 2022-04-15 DIAGNOSIS — Z7901 Long term (current) use of anticoagulants: Secondary | ICD-10-CM | POA: Diagnosis not present

## 2022-04-15 LAB — POCT INR: INR: 1.2 — AB (ref 2.0–3.0)

## 2022-04-15 NOTE — Patient Instructions (Signed)
Description   Take 3 tablets today and 3.5 tablets tomorrow and then continue taking 2 tablets (2mg ) daily except 3 tablets (3mg ) on Mondays, Wednesdays and Fridays. Recheck INR 1 week.  Coumadin Clinic [phone] Fax Clearance form to [phone]

## 2022-04-16 DIAGNOSIS — I2510 Atherosclerotic heart disease of native coronary artery without angina pectoris: Secondary | ICD-10-CM | POA: Diagnosis not present

## 2022-04-16 DIAGNOSIS — D509 Iron deficiency anemia, unspecified: Secondary | ICD-10-CM | POA: Diagnosis not present

## 2022-04-16 DIAGNOSIS — N1832 Chronic kidney disease, stage 3b: Secondary | ICD-10-CM | POA: Diagnosis not present

## 2022-04-16 DIAGNOSIS — J449 Chronic obstructive pulmonary disease, unspecified: Secondary | ICD-10-CM | POA: Diagnosis not present

## 2022-04-16 DIAGNOSIS — I5023 Acute on chronic systolic (congestive) heart failure: Secondary | ICD-10-CM | POA: Diagnosis not present

## 2022-04-16 DIAGNOSIS — I350 Nonrheumatic aortic (valve) stenosis: Secondary | ICD-10-CM | POA: Diagnosis not present

## 2022-04-16 DIAGNOSIS — I480 Paroxysmal atrial fibrillation: Secondary | ICD-10-CM | POA: Diagnosis not present

## 2022-04-16 DIAGNOSIS — Z9181 History of falling: Secondary | ICD-10-CM | POA: Diagnosis not present

## 2022-04-16 DIAGNOSIS — K219 Gastro-esophageal reflux disease without esophagitis: Secondary | ICD-10-CM | POA: Diagnosis not present

## 2022-04-16 DIAGNOSIS — Z7901 Long term (current) use of anticoagulants: Secondary | ICD-10-CM | POA: Diagnosis not present

## 2022-04-16 DIAGNOSIS — E785 Hyperlipidemia, unspecified: Secondary | ICD-10-CM | POA: Diagnosis not present

## 2022-04-16 DIAGNOSIS — I252 Old myocardial infarction: Secondary | ICD-10-CM | POA: Diagnosis not present

## 2022-04-16 DIAGNOSIS — K222 Esophageal obstruction: Secondary | ICD-10-CM | POA: Diagnosis not present

## 2022-04-16 DIAGNOSIS — H409 Unspecified glaucoma: Secondary | ICD-10-CM | POA: Diagnosis not present

## 2022-04-16 DIAGNOSIS — Z7982 Long term (current) use of aspirin: Secondary | ICD-10-CM | POA: Diagnosis not present

## 2022-04-16 DIAGNOSIS — M1990 Unspecified osteoarthritis, unspecified site: Secondary | ICD-10-CM | POA: Diagnosis not present

## 2022-04-16 DIAGNOSIS — R1319 Other dysphagia: Secondary | ICD-10-CM | POA: Diagnosis not present

## 2022-04-16 DIAGNOSIS — Z87891 Personal history of nicotine dependence: Secondary | ICD-10-CM | POA: Diagnosis not present

## 2022-04-16 DIAGNOSIS — Z951 Presence of aortocoronary bypass graft: Secondary | ICD-10-CM | POA: Diagnosis not present

## 2022-04-16 DIAGNOSIS — Z954 Presence of other heart-valve replacement: Secondary | ICD-10-CM | POA: Diagnosis not present

## 2022-04-17 ENCOUNTER — Other Ambulatory Visit: Payer: Self-pay

## 2022-04-17 ENCOUNTER — Other Ambulatory Visit: Payer: Self-pay | Admitting: Nurse Practitioner

## 2022-04-17 ENCOUNTER — Ambulatory Visit (INDEPENDENT_AMBULATORY_CARE_PROVIDER_SITE_OTHER): Payer: Medicare HMO

## 2022-04-17 DIAGNOSIS — E785 Hyperlipidemia, unspecified: Secondary | ICD-10-CM | POA: Diagnosis not present

## 2022-04-17 DIAGNOSIS — Z9181 History of falling: Secondary | ICD-10-CM | POA: Diagnosis not present

## 2022-04-17 DIAGNOSIS — N1832 Chronic kidney disease, stage 3b: Secondary | ICD-10-CM

## 2022-04-17 DIAGNOSIS — Z87891 Personal history of nicotine dependence: Secondary | ICD-10-CM | POA: Diagnosis not present

## 2022-04-17 DIAGNOSIS — I480 Paroxysmal atrial fibrillation: Secondary | ICD-10-CM | POA: Diagnosis not present

## 2022-04-17 DIAGNOSIS — I350 Nonrheumatic aortic (valve) stenosis: Secondary | ICD-10-CM | POA: Diagnosis not present

## 2022-04-17 DIAGNOSIS — K219 Gastro-esophageal reflux disease without esophagitis: Secondary | ICD-10-CM | POA: Diagnosis not present

## 2022-04-17 DIAGNOSIS — Z7982 Long term (current) use of aspirin: Secondary | ICD-10-CM | POA: Diagnosis not present

## 2022-04-17 DIAGNOSIS — I10 Essential (primary) hypertension: Secondary | ICD-10-CM

## 2022-04-17 DIAGNOSIS — Z952 Presence of prosthetic heart valve: Secondary | ICD-10-CM

## 2022-04-17 DIAGNOSIS — D509 Iron deficiency anemia, unspecified: Secondary | ICD-10-CM | POA: Diagnosis not present

## 2022-04-17 DIAGNOSIS — I5023 Acute on chronic systolic (congestive) heart failure: Secondary | ICD-10-CM | POA: Diagnosis not present

## 2022-04-17 DIAGNOSIS — Z7901 Long term (current) use of anticoagulants: Secondary | ICD-10-CM

## 2022-04-17 DIAGNOSIS — R1319 Other dysphagia: Secondary | ICD-10-CM | POA: Diagnosis not present

## 2022-04-17 DIAGNOSIS — H409 Unspecified glaucoma: Secondary | ICD-10-CM | POA: Diagnosis not present

## 2022-04-17 DIAGNOSIS — I5043 Acute on chronic combined systolic (congestive) and diastolic (congestive) heart failure: Secondary | ICD-10-CM

## 2022-04-17 DIAGNOSIS — M1990 Unspecified osteoarthritis, unspecified site: Secondary | ICD-10-CM | POA: Diagnosis not present

## 2022-04-17 DIAGNOSIS — Z951 Presence of aortocoronary bypass graft: Secondary | ICD-10-CM | POA: Diagnosis not present

## 2022-04-17 DIAGNOSIS — I252 Old myocardial infarction: Secondary | ICD-10-CM | POA: Diagnosis not present

## 2022-04-17 DIAGNOSIS — J449 Chronic obstructive pulmonary disease, unspecified: Secondary | ICD-10-CM | POA: Diagnosis not present

## 2022-04-17 DIAGNOSIS — I700 Atherosclerosis of aorta: Secondary | ICD-10-CM

## 2022-04-17 DIAGNOSIS — Z954 Presence of other heart-valve replacement: Secondary | ICD-10-CM | POA: Diagnosis not present

## 2022-04-17 DIAGNOSIS — K222 Esophageal obstruction: Secondary | ICD-10-CM | POA: Diagnosis not present

## 2022-04-17 DIAGNOSIS — I2510 Atherosclerotic heart disease of native coronary artery without angina pectoris: Secondary | ICD-10-CM | POA: Diagnosis not present

## 2022-04-17 NOTE — Chronic Care Management (AMB) (Deleted)
Chronic Care Management   CCM RN Visit Note  04/17/2022 Name: Keith Jenkins MRN: [id number] DOB: [birth year]  Subjective: Keith Jenkins is a 76 y.o. year old male who is a primary care patient of [name], NP. The patient was referred to the Chronic Care Management team for assistance with care management needs subsequent to provider initiation of CCM services and plan of care.    Today's Visit:   spoke to his wife Keith Jenkins who is also DRP  for initial visit.     SDOH Interventions Today    Flowsheet Row Most Recent Value  SDOH Interventions   Food Insecurity Interventions Other (Comment)  [states it is getting harder to get food, is receptive to talking to care guides for food resources]  Housing Interventions Other (Comment)  [has repairs that are needed in the home and cannot afford, open to talking to careguides for assistance with resources in their area]  Transportation Interventions Intervention Not Indicated  Utilities Interventions Ambulatory [id number] Order  [needs assistance with utilities]  Alcohol Usage Interventions Intervention Not Indicated (Score <7)  Financial Strain Interventions Other (Comment)  [careguide referral for resources in the area to help with financial assistance, food, and utilities]  Social Connections Interventions Intervention Not Indicated         Goals Addressed             This Visit's Progress    CCM Expected Outcome:  Monitor, Self-Manage and Reduce Symptoms of Heart Failure       Current Barriers:  Knowledge Deficits related to the importance of monitoring for sx and sx of heart failure and to prevent exacerbations Care Coordination needs related to resources needed to maintain health and well being  in a patient with CHF and other chronic conditions Chronic Disease Management support and education needs related to CHF Lacks caregiver support.  Corporate treasurer.   Planned Interventions: Basic overview and discussion of  pathophysiology of Heart Failure reviewed Provided education on low sodium diet Reviewed Heart Failure Action Plan in depth and provided written copy Assessed need for readable accurate scales in home Provided education about placing scale on hard, flat surface Advised patient to weigh each morning after emptying bladder Discussed importance of daily weight and advised patient to weigh and record daily Reviewed role of diuretics in prevention of fluid overload and management of heart failure Discussed the importance of keeping all appointments with provider Referral made to community resources care guide team for assistance with resources in his area to help with healthy food options, and help with financial burdens Advised patient to discuss changes in edema, swelling, or other questions and concerns related to Heart Health with provider Screening for signs and symptoms of depression related to chronic disease state  Assessed social determinant of health barriers The patient is having issues with RLS and the gabapentin is not working per his wife. She states he is all over the bed at night. Collaboration with the pcp and pharm D on recommendations to help with RLS  Symptom Management: Take medications as prescribed   Attend all scheduled provider appointments Call provider office for new concerns or questions  Work with the social worker to address care coordination needs and will continue to work with the clinical team to address health care and disease management related needs call the Suicide and Crisis Lifeline: 988 call the [place] National Suicide Prevention Lifeline: 424-593-8762 or TTY: 717-856-2211 TTY (681) 168-5199) to talk to a trained counselor call 1-800-273-TALK (toll free,  24 hour hotline) if experiencing a Mental Health or Behavioral Health Crisis  call office if I gain more than 2 pounds in one day or 5 pounds in one week keep legs up while sitting track weight in diary use  salt in moderation watch for swelling in feet, ankles and legs every day weigh myself daily begin a heart failure diary bring diary to all appointments develop a rescue plan follow rescue plan if symptoms flare-up track symptoms and what helps feel better or worse dress right for the weather, hot or cold  Follow Up Plan: Telephone follow up appointment with care management team member scheduled for: 05-19-2022 at 1145 am       CCM Expected Outcome:  Monitor, Self-Manage, and Reduce Symptoms of Hypertension       Current Barriers:  Knowledge Deficits related to the importance of normalized blood pressures and monitoring for changes in blood pressures in a patient with HTN and also hypotension at times  Care Coordination needs related to financial needs and resources  in a patient with HTN and other chronic conditions Chronic Disease Management support and education needs related to effective management of HTN Lacks caregiver support.  Corporate treasurer.   Planned Interventions: Evaluation of current treatment plan related to hypertension self management and patient's adherence to plan as established by provider;   Provided education to patient re: stroke prevention, s/s of heart attack and stroke; Reviewed prescribed diet heart healthy Reviewed medications with patient and discussed importance of compliance;  Discussed plans with patient for ongoing care management follow up and provided patient with direct contact information for care management team; Advised patient, providing education and rationale, to monitor blood pressure daily and record, calling PCP for findings outside established parameters. The patients wife states that his blood pressures have been running better at 110's over 60's. She states it has been as low as 89/60. Review of monitoring for falls and safety concerns and light headedness or dizziness. The patient has a history of frequent falls Reviewed  scheduled/upcoming provider appointments including: 05-12-2022 with pcp Advised patient to discuss changes in HTN or heart health with provider; Provided education on prescribed diet hear healthy diet, also a care guide referral placed for assistance with food resources due to financial difficulties ;  Discussed complications of poorly controlled blood pressure such as heart disease, stroke, circulatory complications, vision complications, kidney impairment, sexual dysfunction;  Screening for signs and symptoms of depression related to chronic disease state;  Assessed social determinant of health barriers;   Symptom Management: Take medications as prescribed   Attend all scheduled provider appointments Call provider office for new concerns or questions  Work with the social worker to address care coordination needs and will continue to work with the clinical team to address health care and disease management related needs call the Suicide and Crisis Lifeline: 988 call the [place] National Suicide Prevention Lifeline: 820-151-1141 or TTY: (773)257-8087 TTY (743) 436-2722) to talk to a trained counselor call 1-800-273-TALK (toll free, 24 hour hotline) if experiencing a Mental Health or Behavioral Health Crisis  check blood pressure 3 times per week write blood pressure results in a log or diary learn about high blood pressure keep a blood pressure log take blood pressure log to all doctor appointments call doctor for signs and symptoms of high blood pressure develop an action plan for high blood pressure keep all doctor appointments take medications for blood pressure exactly as prescribed report new symptoms to your doctor  Follow Up  Plan: Telephone follow up appointment with care management team member scheduled for: 05-19-2022 at 1145 am          Plan:Telephone follow up appointment with care management team member scheduled for:  05-19-2022 at 1145 am  Alto Denver RN, MSN, CCM RN Care  Manager  Chronic Care Management Direct Number: [phone]

## 2022-04-17 NOTE — Chronic Care Management (AMB) (Deleted)
Chronic Care Management Provider Comprehensive Care Plan    04/17/2022 Name: Keith Jenkins MRN: [id number] DOB: 12-18-1945  Referral to Chronic Care Management (CCM) services was placed by Provider:  Flonnie Hailstone, NP on Date: 04-17-2022.  Chronic Condition 1: CHF Provider Assessment and Plan continue Amiodarone, Digoxin 0.125 mg daily, Spironolactone 12.5 mg daily, Torsemide 40 mg daily  {Tip this will not be part of the note when signed- Include the provider name, date and note type (Optional):26781}  Expected Outcome/Goals Addressed This Visit (Provider CCM goals/Provider Assessment and plan   Symptom Management Condition 1: Take all medications as prescribed Attend all scheduled provider appointments Call provider office for new concerns or questions  Work with the social worker to address care coordination needs and will continue to work with the clinical team to address health care and disease management related needs call the Suicide and Crisis Lifeline: 988 call the [place] National Suicide Prevention Lifeline: 539-433-5299 or TTY: 330-726-7016 TTY 670 664 6162) to talk to a trained counselor call 1-800-273-TALK (toll free, 24 hour hotline) if experiencing a Mental Health or Behavioral Health Crisis  call office if I gain more than 2 pounds in one day or 5 pounds in one week keep legs up while sitting track weight in diary use salt in moderation watch for swelling in feet, ankles and legs every day weigh myself daily begin a heart failure diary bring diary to all appointments develop a rescue plan follow rescue plan if symptoms flare-up track symptoms and what helps feel better or worse dress right for the weather, hot or cold  Chronic Condition 2: HTN Provider Assessment and Plan -continue Lipitor 80 mg QD and Zetia 10 mg QD -continue heart healthy diet {Tip this will not be part of the note when signed- Include the provider name, date and note type  (Optional):26781}  Expected Outcome/Goals Addressed This Visit (Provider CCM goals/Provider Assessment and plan   CCM (HYPERTENSION)  EXPECTED OUTCOME:  MONITOR,SELF- MANAGE AND REDUCE SYMPTOMS OF HYPERTENSION   Symptom Management Condition 2: Take all medications as prescribed Attend all scheduled provider appointments Call provider office for new concerns or questions  Work with the social worker to address care coordination needs and will continue to work with the clinical team to address health care and disease management related needs call the Suicide and Crisis Lifeline: 988 call the [place] National Suicide Prevention Lifeline: (475) 375-9885 or TTY: 2198269522 TTY 5127266424) to talk to a trained counselor call 1-800-273-TALK (toll free, 24 hour hotline) if experiencing a Mental Health or Behavioral Health Crisis  check blood pressure 3 times per week write blood pressure results in a log or diary learn about high blood pressure keep a blood pressure log take blood pressure log to all doctor appointments call doctor for signs and symptoms of high blood pressure keep all doctor appointments take medications for blood pressure exactly as prescribed report new symptoms to your doctor  Problem List Patient Active Problem List   Diagnosis Date Noted   Prediabetes 04/14/2022   Atherosclerosis of aorta (HCC) 04/14/2022   Abnormal esophagram    Stricture and stenosis of esophagus    Acute on chronic systolic heart failure (HCC) 03/15/2022   Acute CHF (congestive heart failure) (HCC) 03/12/2022   Heat syncope 02/24/2022   Contusion of scalp 02/24/2022   Scalp laceration 02/24/2022   Fall on same level 02/24/2022   Need for influenza vaccination 02/24/2022   Microcytic anemia 02/24/2022   Stage 3b chronic kidney disease (CKD) (HCC) 02/24/2022  Long term (current) use of anticoagulants 11/26/2021   Mitral valve replaced 11/26/2021   Esophageal dysphagia    Mitral  regurgitation 11/01/2021   S/P mitral valve replacement 11/01/2021   Acute on chronic combined systolic (congestive) and diastolic (congestive) heart failure (HCC) 10/24/2021   Pericardial effusion    Severe mitral regurgitation    NSTEMI (non-ST elevated myocardial infarction) (HCC) 10/10/2021   Non-ST elevation (NSTEMI) myocardial infarction (HCC) 10/10/2021   Acute systolic heart failure (HCC) 10/10/2021   Hyperlipidemia 10/10/2021    Medication Management  Current Outpatient Medications:    albuterol (PROVENTIL) (2.5 MG/3ML) 0.083% nebulizer solution, Inhale 3 mLs into the lungs every 6 (six) hours as needed for wheezing or shortness of breath., Disp: 90 mL, Rfl: 12   albuterol (VENTOLIN HFA) 108 (90 Base) MCG/ACT inhaler, Inhale 2 puffs into the lungs every 6 (six) hours as needed for wheezing or shortness of breath., Disp: 8 g, Rfl: 0   amiodarone (PACERONE) 200 MG tablet, Take 0.5 tablets (100 mg total) by mouth daily., Disp: 45 tablet, Rfl: 1   aspirin EC 81 MG tablet, Take 1 tablet (81 mg total) by mouth daily. Swallow whole., Disp: 90 tablet, Rfl: 1   atorvastatin (LIPITOR) 80 MG tablet, Take 1 tablet (80 mg total) by mouth daily., Disp: 30 tablet, Rfl: 3   dapagliflozin propanediol (FARXIGA) 10 MG TABS tablet, Take 1 tablet (10 mg total) by mouth daily., Disp: 90 tablet, Rfl: 1   digoxin (LANOXIN) 0.125 MG tablet, Take 0.5 tablets (0.0625 mg total) by mouth daily., Disp: 15 tablet, Rfl: 3   escitalopram (LEXAPRO) 10 MG tablet, TAKE ONE TABLET BY MOUTH ONCE DAILY, Disp: 90 tablet, Rfl: 1   esomeprazole (NEXIUM) 40 MG capsule, Take 40 mg by mouth daily., Disp: , Rfl:    ezetimibe (ZETIA) 10 MG tablet, Take 0.5 tablets (5 mg total) by mouth daily., Disp: 15 tablet, Rfl: 3   Fluticasone-Umeclidin-Vilant (TRELEGY ELLIPTA) 100-62.5-25 MCG/ACT AEPB, Inhale 1 puff into the lungs daily., Disp: 1 each, Rfl: 2   gabapentin (NEURONTIN) 600 MG tablet, Take 1 tablet (600 mg total) by mouth at  bedtime., Disp: 90 tablet, Rfl: 1   latanoprost (XALATAN) 0.005 % ophthalmic solution, Place 1 drop into both eyes at bedtime., Disp: , Rfl:    midodrine (PROAMATINE) 10 MG tablet, Take 1 tablet (10 mg total) by mouth 3 (three) times daily., Disp: 90 tablet, Rfl: 6   nitroGLYCERIN (NITROSTAT) 0.4 MG SL tablet, Place 1 tablet (0.4 mg total) under the tongue every 5 (five) minutes as needed for chest pain., Disp: 25 tablet, Rfl: 3   OVER THE COUNTER MEDICATION, Apply 1 application  topically at bedtime. Medication: Mag Maxx Cream Back and restless leg, Disp: , Rfl:    potassium chloride SA (KLOR-CON M) 20 MEQ tablet, Take 2 tablets (40 mEq total) by mouth daily., Disp: 30 tablet, Rfl: 6   spironolactone (ALDACTONE) 25 MG tablet, Take 1 tablet (25 mg total) by mouth daily., Disp: 60 tablet, Rfl: 3   torsemide (DEMADEX) 20 MG tablet, Take 3 tablets (60 mg total) by mouth daily., Disp: 60 tablet, Rfl: 3   traZODone (DESYREL) 50 MG tablet, Take 1 tablet (50 mg total) by mouth at bedtime., Disp: 30 tablet, Rfl: 2   warfarin (COUMADIN) 1 MG tablet, Take 2 tablets to 3 tablets by mouth daily as directed by [facility] (Patient taking differently: Take 2-3 mg by mouth See admin instructions. Take 3 mg Monday, Wednesday and Friday. All the  other days take 2 mg  in the PM as directed by [facility]), Disp: 200 tablet, Rfl: 0  Cognitive Assessment Identity Confirmed: : Name; DOB Cognitive Status: Normal Other:  : HIPPA verified on behalf of Bonita Quin, who is wife and DRP   Functional Assessment Hearing Difficulty or Deaf: no (had ears cleaned out recently) Wear Glasses or Blind: no (goes to eye care- [facility], right eye therapy- injections) Concentrating, Remembering or Making Decisions Difficulty (CP): no Difficulty Communicating: no Difficulty Eating/Swallowing: no Walking or Climbing Stairs Difficulty: yes Walking or Climbing Stairs: ambulation difficulty, requires equipment Mobility  Management: uses a cane when wallking, high fall risk Dressing/Bathing Difficulty: yes Dressing/Bathing: bathing difficulty, assistance 1 person Dressing/Bathing Management: bathroom not safe and needs help Doing Errands Independently Difficulty (such as shopping) (CP): yes Errands Management: dependent on wife to help with errands Change in Functional Status Since Onset of Current Illness/Injury: no   Caregiver Assessment  Primary Source of Support/Comfort: spouse Name of Support/Comfort Primary Source: Jedi Catalfamo, spouse People in Home: spouse Name(s) of People in Home: Jung Yurchak- wife Family Caregiver if Needed: spouse Family Caregiver Names: Bonita Quin Primary Roles/Responsibilities: retired Expected Impact of Illness/Hospitalization: needs financial assistance and concerned about paying bills and getting things they need Concerns About Impact on Relationships: concerned about his health   Planned Interventions {Tip this will not be part of the note when signed- Include the provider name, date and note type (Optional):26781} Basic overview and discussion of pathophysiology of Heart Failure reviewed Provided education on low sodium diet Reviewed Heart Failure Action Plan in depth and provided written copy Assessed need for readable accurate scales in home Provided education about placing scale on hard, flat surface Advised patient to weigh each morning after emptying bladder Discussed importance of daily weight and advised patient to weigh and record daily Reviewed role of diuretics in prevention of fluid overload and management of heart failure Discussed the importance of keeping all appointments with provider Referral made to community resources care guide team for assistance with resources in his area to help with healthy food options, and help with financial burdens Advised patient to discuss changes in edema, swelling, or other questions and concerns related to Heart Health with  provider Screening for signs and symptoms of depression related to chronic disease state  Assessed social determinant of health barriers The patient is having issues with RLS and the gabapentin is not working per his wife. She states he is all over the bed at night. Collaboration with the pcp and pharm D on recommendations to help with RLS Evaluation of current treatment plan related to hypertension self management and patient's adherence to plan as established by provider;   Provided education to patient re: stroke prevention, s/s of heart attack and stroke; Reviewed prescribed diet heart healthy Reviewed medications with patient and discussed importance of compliance;  Discussed plans with patient for ongoing care management follow up and provided patient with direct contact information for care management team; Advised patient, providing education and rationale, to monitor blood pressure daily and record, calling PCP for findings outside established parameters. The patients wife states that his blood pressures have been running better at 110's over 60's. She states it has been as low as 89/60. Review of monitoring for falls and safety concerns and light headedness or dizziness. The patient has a history of frequent falls Reviewed scheduled/upcoming provider appointments including: 05-12-2022 with pcp Advised patient to discuss changes in HTN or heart health with provider; Provided  education on prescribed diet hear healthy diet, also a care guide referral placed for assistance with food resources due to financial difficulties ;  Discussed complications of poorly controlled blood pressure such as heart disease, stroke, circulatory complications, vision complications, kidney impairment, sexual dysfunction;  Screening for signs and symptoms of depression related to chronic disease state;  Assessed social determinant of health barriers  Interaction and coordination with outside resources,  practitioners, and providers See CCM Referral  Care Plan: {CCMCAREPLAN:28164}

## 2022-04-17 NOTE — Chronic Care Management (AMB) (Deleted)
Chronic Care Management Provider Comprehensive Care Plan    04/17/2022 Name: Keith Jenkins MRN: [id number] DOB: 1945-11-17  Referral to Chronic Care Management (CCM) services was placed by Provider:  Flonnie Hailstone, NP on Date: 04-17-2022.  Chronic Condition 1: CHF Provider Assessment and Plan continue Amiodarone, Digoxin 0.125 mg daily, Spironolactone 12.5 mg daily, Torsemide 40 mg daily    Expected Outcome/Goals Addressed This Visit (Provider CCM goals/Provider Assessment and plan   Symptom Management Condition 1: Take all medications as prescribed Attend all scheduled provider appointments Call provider office for new concerns or questions  Work with the social worker to address care coordination needs and will continue to work with the clinical team to address health care and disease management related needs call the Suicide and Crisis Lifeline: 988 call the [place] National Suicide Prevention Lifeline: (614) 391-0865 or TTY: (347) 841-6863 TTY 623 412 4716) to talk to a trained counselor call 1-800-273-TALK (toll free, 24 hour hotline) if experiencing a Mental Health or Behavioral Health Crisis  call office if I gain more than 2 pounds in one day or 5 pounds in one week keep legs up while sitting track weight in diary use salt in moderation watch for swelling in feet, ankles and legs every day weigh myself daily begin a heart failure diary bring diary to all appointments develop a rescue plan follow rescue plan if symptoms flare-up track symptoms and what helps feel better or worse dress right for the weather, hot or cold  Chronic Condition 2: HTN Provider Assessment and Plan -continue Lipitor 80 mg QD and Zetia 10 mg QD CBC with Differential/Platelet - Comprehensive metabolic panel Continue low-salt diet Continue medications - Lipid panel -continue heart healthy diet   Expected Outcome/Goals Addressed This Visit (Provider CCM goals/Provider Assessment and plan   CCM  (HYPERTENSION)  EXPECTED OUTCOME:  MONITOR,SELF- MANAGE AND REDUCE SYMPTOMS OF HYPERTENSION   Symptom Management Condition 2: Take all medications as prescribed Attend all scheduled provider appointments Call provider office for new concerns or questions  Work with the social worker to address care coordination needs and will continue to work with the clinical team to address health care and disease management related needs call the Suicide and Crisis Lifeline: 988 call the [place] National Suicide Prevention Lifeline: 534 269 6402 or TTY: (409) 836-6313 TTY 815-171-5783) to talk to a trained counselor call 1-800-273-TALK (toll free, 24 hour hotline) if experiencing a Mental Health or Behavioral Health Crisis  check blood pressure 3 times per week write blood pressure results in a log or diary learn about high blood pressure keep a blood pressure log take blood pressure log to all doctor appointments call doctor for signs and symptoms of high blood pressure keep all doctor appointments take medications for blood pressure exactly as prescribed report new symptoms to your doctor  Problem List Patient Active Problem List   Diagnosis Date Noted   Prediabetes 04/14/2022   Atherosclerosis of aorta (HCC) 04/14/2022   Abnormal esophagram    Stricture and stenosis of esophagus    Acute on chronic systolic heart failure (HCC) 03/15/2022   Acute CHF (congestive heart failure) (HCC) 03/12/2022   Heat syncope 02/24/2022   Contusion of scalp 02/24/2022   Scalp laceration 02/24/2022   Fall on same level 02/24/2022   Need for influenza vaccination 02/24/2022   Microcytic anemia 02/24/2022   Stage 3b chronic kidney disease (CKD) (HCC) 02/24/2022   Long term (current) use of anticoagulants 11/26/2021   Mitral valve replaced 11/26/2021   Esophageal dysphagia    Mitral  regurgitation 11/01/2021   S/P mitral valve replacement 11/01/2021   Acute on chronic combined systolic (congestive) and  diastolic (congestive) heart failure (HCC) 10/24/2021   Pericardial effusion    Severe mitral regurgitation    NSTEMI (non-ST elevated myocardial infarction) (HCC) 10/10/2021   Non-ST elevation (NSTEMI) myocardial infarction (HCC) 10/10/2021   Acute systolic heart failure (HCC) 10/10/2021   Hyperlipidemia 10/10/2021    Medication Management  Current Outpatient Medications:    albuterol (PROVENTIL) (2.5 MG/3ML) 0.083% nebulizer solution, Inhale 3 mLs into the lungs every 6 (six) hours as needed for wheezing or shortness of breath., Disp: 90 mL, Rfl: 12   albuterol (VENTOLIN HFA) 108 (90 Base) MCG/ACT inhaler, Inhale 2 puffs into the lungs every 6 (six) hours as needed for wheezing or shortness of breath., Disp: 8 g, Rfl: 0   amiodarone (PACERONE) 200 MG tablet, Take 0.5 tablets (100 mg total) by mouth daily., Disp: 45 tablet, Rfl: 1   aspirin EC 81 MG tablet, Take 1 tablet (81 mg total) by mouth daily. Swallow whole., Disp: 90 tablet, Rfl: 1   atorvastatin (LIPITOR) 80 MG tablet, Take 1 tablet (80 mg total) by mouth daily., Disp: 30 tablet, Rfl: 3   dapagliflozin propanediol (FARXIGA) 10 MG TABS tablet, Take 1 tablet (10 mg total) by mouth daily., Disp: 90 tablet, Rfl: 1   digoxin (LANOXIN) 0.125 MG tablet, Take 0.5 tablets (0.0625 mg total) by mouth daily., Disp: 15 tablet, Rfl: 3   escitalopram (LEXAPRO) 10 MG tablet, TAKE ONE TABLET BY MOUTH ONCE DAILY, Disp: 90 tablet, Rfl: 1   esomeprazole (NEXIUM) 40 MG capsule, Take 40 mg by mouth daily., Disp: , Rfl:    ezetimibe (ZETIA) 10 MG tablet, Take 0.5 tablets (5 mg total) by mouth daily., Disp: 15 tablet, Rfl: 3   Fluticasone-Umeclidin-Vilant (TRELEGY ELLIPTA) 100-62.5-25 MCG/ACT AEPB, Inhale 1 puff into the lungs daily., Disp: 1 each, Rfl: 2   gabapentin (NEURONTIN) 600 MG tablet, Take 1 tablet (600 mg total) by mouth at bedtime., Disp: 90 tablet, Rfl: 1   latanoprost (XALATAN) 0.005 % ophthalmic solution, Place 1 drop into both eyes at  bedtime., Disp: , Rfl:    midodrine (PROAMATINE) 10 MG tablet, Take 1 tablet (10 mg total) by mouth 3 (three) times daily., Disp: 90 tablet, Rfl: 6   nitroGLYCERIN (NITROSTAT) 0.4 MG SL tablet, Place 1 tablet (0.4 mg total) under the tongue every 5 (five) minutes as needed for chest pain., Disp: 25 tablet, Rfl: 3   OVER THE COUNTER MEDICATION, Apply 1 application  topically at bedtime. Medication: Mag Maxx Cream Back and restless leg, Disp: , Rfl:    potassium chloride SA (KLOR-CON M) 20 MEQ tablet, Take 2 tablets (40 mEq total) by mouth daily., Disp: 30 tablet, Rfl: 6   spironolactone (ALDACTONE) 25 MG tablet, Take 1 tablet (25 mg total) by mouth daily., Disp: 60 tablet, Rfl: 3   torsemide (DEMADEX) 20 MG tablet, Take 3 tablets (60 mg total) by mouth daily., Disp: 60 tablet, Rfl: 3   traZODone (DESYREL) 50 MG tablet, Take 1 tablet (50 mg total) by mouth at bedtime., Disp: 30 tablet, Rfl: 2   warfarin (COUMADIN) 1 MG tablet, Take 2 tablets to 3 tablets by mouth daily as directed by [facility] (Patient taking differently: Take 2-3 mg by mouth See admin instructions. Take 3 mg Monday, Wednesday and Friday. All the other days take 2 mg  in the PM as directed by [facility]), Disp: 200 tablet, Rfl: 0  Cognitive  Assessment Identity Confirmed: : Name; DOB Cognitive Status: Normal Other:  : HIPPA verified on behalf of Bonita Quin, who is wife and DRP   Functional Assessment Hearing Difficulty or Deaf: no (had ears cleaned out recently) Wear Glasses or Blind: no (goes to eye care- [facility], right eye therapy- injections) Concentrating, Remembering or Making Decisions Difficulty (CP): no Difficulty Communicating: no Difficulty Eating/Swallowing: no Walking or Climbing Stairs Difficulty: yes Walking or Climbing Stairs: ambulation difficulty, requires equipment Mobility Management: uses a cane when wallking, high fall risk Dressing/Bathing Difficulty: yes Dressing/Bathing: bathing  difficulty, assistance 1 person Dressing/Bathing Management: bathroom not safe and needs help Doing Errands Independently Difficulty (such as shopping) (CP): yes Errands Management: dependent on wife to help with errands Change in Functional Status Since Onset of Current Illness/Injury: no   Caregiver Assessment  Primary Source of Support/Comfort: spouse Name of Support/Comfort Primary Source: Juelz Whittenberg, spouse People in Home: spouse Name(s) of People in Home: Antrone Walla- wife Family Caregiver if Needed: spouse Family Caregiver Names: Bonita Quin Primary Roles/Responsibilities: retired Expected Impact of Illness/Hospitalization: needs financial assistance and concerned about paying bills and getting things they need Concerns About Impact on Relationships: concerned about his health   Planned Interventions  Basic overview and discussion of pathophysiology of Heart Failure reviewed Provided education on low sodium diet Reviewed Heart Failure Action Plan in depth and provided written copy Assessed need for readable accurate scales in home Provided education about placing scale on hard, flat surface Advised patient to weigh each morning after emptying bladder Discussed importance of daily weight and advised patient to weigh and record daily Reviewed role of diuretics in prevention of fluid overload and management of heart failure Discussed the importance of keeping all appointments with provider Referral made to community resources care guide team for assistance with resources in his area to help with healthy food options, and help with financial burdens Advised patient to discuss changes in edema, swelling, or other questions and concerns related to Heart Health with provider Screening for signs and symptoms of depression related to chronic disease state  Assessed social determinant of health barriers The patient is having issues with RLS and the gabapentin is not working per his wife. She  states he is all over the bed at night. Collaboration with the pcp and pharm D on recommendations to help with RLS Evaluation of current treatment plan related to hypertension self management and patient's adherence to plan as established by provider;   Provided education to patient re: stroke prevention, s/s of heart attack and stroke; Reviewed prescribed diet heart healthy Reviewed medications with patient and discussed importance of compliance;  Discussed plans with patient for ongoing care management follow up and provided patient with direct contact information for care management team; Advised patient, providing education and rationale, to monitor blood pressure daily and record, calling PCP for findings outside established parameters. The patients wife states that his blood pressures have been running better at 110's over 60's. She states it has been as low as 89/60. Review of monitoring for falls and safety concerns and light headedness or dizziness. The patient has a history of frequent falls Reviewed scheduled/upcoming provider appointments including: 05-12-2022 with pcp Advised patient to discuss changes in HTN or heart health with provider; Provided education on prescribed diet hear healthy diet, also a care guide referral placed for assistance with food resources due to financial difficulties ;  Discussed complications of poorly controlled blood pressure such as heart disease, stroke, circulatory complications, vision complications,  kidney impairment, sexual dysfunction;  Screening for signs and symptoms of depression related to chronic disease state;  Assessed social determinant of health barriers  Interaction and coordination with outside resources, practitioners, and providers See CCM Referral  Care Plan: Available in MyChart

## 2022-04-17 NOTE — Addendum Note (Signed)
Addended by: Arman Bogus A on: 04/17/2022 09:48 AM   Modules accepted: Orders

## 2022-04-17 NOTE — Patient Instructions (Signed)
Please call the care guide team at [phone] if you need to cancel or reschedule your appointment.   If you are experiencing a Mental Health or Behavioral Health Crisis or need someone to talk to, please call the Suicide and Crisis Lifeline: 988 call the [place] National Suicide Prevention Lifeline: 505-215-4823 or TTY: 520-020-0108 TTY (401)212-9445) to talk to a trained counselor call 1-800-273-TALK (toll free, 24 hour hotline)   Following is a copy of your full provider care plan:   Goals Addressed             This Visit's Progress    CCM (KIDNEY FAILURE)  EXPECTED OUTCOME:  MONITOR, SELF-MANAGE AND REDUCE SYMPTOMS OF KIDNEY FAILURE       Current Barriers:  Knowledge Deficits related to the importance of normalized blood pressures, compliance with medications, and monitoring for changes in the patients urinary and kidney function Care Coordination needs related to financial constraints and the need for assistance with basic needs for health and well being  in a patient with CKD Chronic Disease Management support and education needs related to effective management of CKD Lacks caregiver support.  Corporate treasurer.   Planned Interventions: Assessed the patients wife, Bonita Quin  understanding of chronic kidney disease    Evaluation of current treatment plan related to chronic kidney disease self management and patient's adherence to plan as established by provider      Provided education to patient re: stroke prevention, s/s of heart attack and stroke    Reviewed prescribed diet heart healthy Reviewed medications with patient and discussed importance of compliance    Advised patient, providing education and rationale, to monitor blood pressure daily and record, calling PCP for findings outside established parameters    Discussed complications of poorly controlled blood pressure such as heart disease, stroke, circulatory complications, vision complications, kidney impairment, sexual  dysfunction    Reviewed scheduled/upcoming provider appointments including: 05-12-2022 at 1145 am    Advised patient to discuss changes in kidney function with provider    Discussed plans with patient for ongoing care management follow up and provided patient with direct contact information for care management team    Screening for signs and symptoms of depression related to chronic disease state      Discussed the impact of chronic kidney disease on daily life and mental health and acknowledged and normalized feelings of disempowerment, fear, and frustration    Assessed social determinant of health barriers    Provided education on kidney disease progression    Engage patient in early, proactive and ongoing discussion about goals of care and what matters most to them    Support coping and stress management by recognizing current strategies and assist in developing new strategies such as mindfulness, journaling, relaxation techniques, problem-solving     Symptom Management: Take medications as prescribed   Attend all scheduled provider appointments Call provider office for new concerns or questions  call the Suicide and Crisis Lifeline: 988 call the [place] National Suicide Prevention Lifeline: (256)655-9754 or TTY: 806-037-9386 TTY 602-385-4528) to talk to a trained counselor call 1-800-273-TALK (toll free, 24 hour hotline) if experiencing a Mental Health or Behavioral Health Crisis   Follow Up Plan: Telephone follow up appointment with care management team member scheduled for: 05-19-2022 at 1145 am       CCM Expected Outcome:  Monitor, Self-Manage and Reduce Symptoms of Heart Failure       Current Barriers:  Knowledge Deficits related to the importance of monitoring for sx and  sx of heart failure and to prevent exacerbations Care Coordination needs related to resources needed to maintain health and well being  in a patient with CHF and other chronic conditions Chronic Disease Management  support and education needs related to CHF Lacks caregiver support.  Corporate treasurer.   Planned Interventions: Basic overview and discussion of pathophysiology of Heart Failure reviewed Provided education on low sodium diet Reviewed Heart Failure Action Plan in depth and provided written copy Assessed need for readable accurate scales in home Provided education about placing scale on hard, flat surface Advised patient to weigh each morning after emptying bladder Discussed importance of daily weight and advised patient to weigh and record daily Reviewed role of diuretics in prevention of fluid overload and management of heart failure Discussed the importance of keeping all appointments with provider Referral made to community resources care guide team for assistance with resources in his area to help with healthy food options, and help with financial burdens Advised patient to discuss changes in edema, swelling, or other questions and concerns related to Heart Health with provider Screening for signs and symptoms of depression related to chronic disease state  Assessed social determinant of health barriers The patient is having issues with RLS and the gabapentin is not working per his wife. She states he is all over the bed at night. Collaboration with the pcp and pharm D on recommendations to help with RLS  Symptom Management: Take medications as prescribed   Attend all scheduled provider appointments Call provider office for new concerns or questions  Work with the social worker to address care coordination needs and will continue to work with the clinical team to address health care and disease management related needs call the Suicide and Crisis Lifeline: 988 call the [place] National Suicide Prevention Lifeline: (507)029-4280 or TTY: 650 218 8077 TTY (670) 551-3123) to talk to a trained counselor call 1-800-273-TALK (toll free, 24 hour hotline) if experiencing a Mental Health or  Behavioral Health Crisis  call office if I gain more than 2 pounds in one day or 5 pounds in one week keep legs up while sitting track weight in diary use salt in moderation watch for swelling in feet, ankles and legs every day weigh myself daily begin a heart failure diary bring diary to all appointments develop a rescue plan follow rescue plan if symptoms flare-up track symptoms and what helps feel better or worse dress right for the weather, hot or cold  Follow Up Plan: Telephone follow up appointment with care management team member scheduled for: 05-19-2022 at 1145 am       CCM Expected Outcome:  Monitor, Self-Manage, and Reduce Symptoms of Hypertension       Current Barriers:  Knowledge Deficits related to the importance of normalized blood pressures and monitoring for changes in blood pressures in a patient with HTN and also hypotension at times  Care Coordination needs related to financial needs and resources  in a patient with HTN and other chronic conditions Chronic Disease Management support and education needs related to effective management of HTN Lacks caregiver support.  Corporate treasurer.   Planned Interventions: Evaluation of current treatment plan related to hypertension self management and patient's adherence to plan as established by provider;   Provided education to patient re: stroke prevention, s/s of heart attack and stroke; Reviewed prescribed diet heart healthy Reviewed medications with patient and discussed importance of compliance;  Discussed plans with patient for ongoing care management follow up and provided patient with direct contact  information for care management team; Advised patient, providing education and rationale, to monitor blood pressure daily and record, calling PCP for findings outside established parameters. The patients wife states that his blood pressures have been running better at 110's over 60's. She states it has been as low as  89/60. Review of monitoring for falls and safety concerns and light headedness or dizziness. The patient has a history of frequent falls Reviewed scheduled/upcoming provider appointments including: 05-12-2022 with pcp Advised patient to discuss changes in HTN or heart health with provider; Provided education on prescribed diet hear healthy diet, also a care guide referral placed for assistance with food resources due to financial difficulties ;  Discussed complications of poorly controlled blood pressure such as heart disease, stroke, circulatory complications, vision complications, kidney impairment, sexual dysfunction;  Screening for signs and symptoms of depression related to chronic disease state;  Assessed social determinant of health barriers;   Symptom Management: Take medications as prescribed   Attend all scheduled provider appointments Call provider office for new concerns or questions  Work with the social worker to address care coordination needs and will continue to work with the clinical team to address health care and disease management related needs call the Suicide and Crisis Lifeline: 988 call the [place] National Suicide Prevention Lifeline: 212-491-4253 or TTY: 517-599-6222 TTY (403)810-6837) to talk to a trained counselor call 1-800-273-TALK (toll free, 24 hour hotline) if experiencing a Mental Health or Behavioral Health Crisis  check blood pressure 3 times per week write blood pressure results in a log or diary learn about high blood pressure keep a blood pressure log take blood pressure log to all doctor appointments call doctor for signs and symptoms of high blood pressure develop an action plan for high blood pressure keep all doctor appointments take medications for blood pressure exactly as prescribed report new symptoms to your doctor  Follow Up Plan: Telephone follow up appointment with care management team member scheduled for: 05-19-2022 at 1145 am                 Patient verbalizes understanding of instructions and care plan provided today and agrees to view in MyChart. Active MyChart status and patient understanding of how to access instructions and care plan via MyChart confirmed with patient.     Telephone follow up appointment with care management team member scheduled for: 05-19-2022 at 1145 am

## 2022-04-18 ENCOUNTER — Encounter: Payer: Self-pay | Admitting: *Deleted

## 2022-04-18 ENCOUNTER — Telehealth: Payer: Self-pay

## 2022-04-18 NOTE — Plan of Care (Cosign Needed)
Chronic Care Management Provider Comprehensive Care Plan    04/18/2022 Name: Keith Jenkins MRN: [id number] DOB: 10/10/1945  Referral to Chronic Care Management (CCM) services was placed by Provider:  Flonnie Hailstone on Date: 04-17-2022.  Chronic Condition 1: HF Provider Assessment and Plan -continue Amiodarone, Digoxin 0.125 mg daily, Spironolactone 12.5 mg daily, Torsemide 40 mg daily  s/p late presentation inferior STEMI 05/23 - Echo (5/23): EF 25-30%, RV moderately reduced, moderate to severe MR. - Limited echo on admit (5/23): EF 30-35%, RV moderately reduced, MR not well evaluated, moderate pericardial effusion - S/p CABG + bioprosthetic MVR 11/01/21 - worse NYHA III-IIIb, mildly volume up on exam and ReDS 53%. - Increase torsemide to 40 mg bid w/ extra 20 kcl x 3 days, then back to torsemide 40 mg daily. - Continue digoxin 0.0625.  Check Dig level today.  - Continue Farxiga 10 mg daily.  - Continue spiro 12.5 mg daily.  - Continue midodrine 10 mg tid for now.  - No room for arb/bb while on midodrine, add back as able.  - Labs today. - Repeat echo soon, discussed with Dr. Gala Romney.   Expected Outcome/Goals Addressed This Visit (Provider CCM goals/Provider Assessment and plan   CCM (HEART FAILURE)  EXPECTED OUTCOME:  MONITOR, SELF-MANAGE AND REDUCE SYMPTOMS OF HEART FAILURE   Symptom Management Condition 1: Take all medications as prescribed Attend all scheduled provider appointments Call provider office for new concerns or questions  Work with the social worker to address care coordination needs and will continue to work with the clinical team to address health care and disease management related needs call the Suicide and Crisis Lifeline: 988 call the [place] National Suicide Prevention Lifeline: (551)316-4315 or TTY: 818-598-5472 TTY 808-791-0355) to talk to a trained counselor call 1-800-273-TALK (toll free, 24 hour hotline) if experiencing a Mental Health or Behavioral  Health Crisis  call office if I gain more than 2 pounds in one day or 5 pounds in one week keep legs up while sitting track weight in diary use salt in moderation watch for swelling in feet, ankles and legs every day weigh myself daily begin a heart failure diary bring diary to all appointments develop a rescue plan follow rescue plan if symptoms flare-up track symptoms and what helps feel better or worse dress right for the weather, hot or cold  Chronic Condition 2: CKD Provider Assessment and Plan -continue Amiodarone, Digoxin 0.125 mg daily, Spironolactone 12.5 mg daily, Torsemide 40 mg daily    Expected Outcome/Goals Addressed This Visit (Provider CCM goals/Provider Assessment and plan   CCM (KIDNEY FAILURE)  EXPECTED OUTCOME:  MONITOR, SELF-MANAGE AND REDUCE SYMPTOMS OF KIDNEY FAILURE  Symptom Management Condition 2: Take all medications as prescribed Attend all scheduled provider appointments Call provider office for new concerns or questions  Work with the social worker to address care coordination needs and will continue to work with the clinical team to address health care and disease management related needs call the Suicide and Crisis Lifeline: 988 call the [place] National Suicide Prevention Lifeline: 906-449-5475 or TTY: 670 116 5401 TTY (646) 792-7727) to talk to a trained counselor call 1-800-273-TALK (toll free, 24 hour hotline) if experiencing a Mental Health or Behavioral Health Crisis   Chronic Condition 3: HTN Provider Assessment and Plan continue Lipitor 80 mg QD and Zetia 10 mg QD -continue heart healthy diet   Expected Outcome/Goals Addressed This Visit (Provider CCM goals/Provider Assessment and plan   CCM (HYPERTENSION)  EXPECTED OUTCOME:  MONITOR,SELF- MANAGE AND REDUCE SYMPTOMS  OF HYPERTENSION  Symptom Management Condition 3: Take all medications as prescribed Attend all scheduled provider appointments Call provider office for new concerns or  questions  Work with the social worker to address care coordination needs and will continue to work with the clinical team to address health care and disease management related needs call the Suicide and Crisis Lifeline: 988 call the [place] National Suicide Prevention Lifeline: (712) 206-2319 or TTY: 4142864860 TTY 4704976124) to talk to a trained counselor call 1-800-273-TALK (toll free, 24 hour hotline) if experiencing a Mental Health or Behavioral Health Crisis  check blood pressure daily write blood pressure results in a log or diary learn about high blood pressure keep a blood pressure log take blood pressure log to all doctor appointments call doctor for signs and symptoms of high blood pressure keep all doctor appointments take medications for blood pressure exactly as prescribed report new symptoms to your doctor   Problem List Patient Active Problem List   Diagnosis Date Noted   Prediabetes 04/14/2022   Atherosclerosis of aorta (HCC) 04/14/2022   Abnormal esophagram    Stricture and stenosis of esophagus    Acute on chronic systolic heart failure (HCC) 03/15/2022   Acute CHF (congestive heart failure) (HCC) 03/12/2022   Heat syncope 02/24/2022   Contusion of scalp 02/24/2022   Scalp laceration 02/24/2022   Fall on same level 02/24/2022   Need for influenza vaccination 02/24/2022   Microcytic anemia 02/24/2022   Stage 3b chronic kidney disease (CKD) (HCC) 02/24/2022   Long term (current) use of anticoagulants 11/26/2021   Mitral valve replaced 11/26/2021   Esophageal dysphagia    Mitral regurgitation 11/01/2021   S/P mitral valve replacement 11/01/2021   Acute on chronic combined systolic (congestive) and diastolic (congestive) heart failure (HCC) 10/24/2021   Pericardial effusion    Severe mitral regurgitation    NSTEMI (non-ST elevated myocardial infarction) (HCC) 10/10/2021   Non-ST elevation (NSTEMI) myocardial infarction (HCC) 10/10/2021   Acute systolic  heart failure (HCC) 57/84/6962   Hyperlipidemia 10/10/2021    Medication Management  Current Outpatient Medications:    albuterol (PROVENTIL) (2.5 MG/3ML) 0.083% nebulizer solution, Inhale 3 mLs into the lungs every 6 (six) hours as needed for wheezing or shortness of breath., Disp: 90 mL, Rfl: 12   albuterol (VENTOLIN HFA) 108 (90 Base) MCG/ACT inhaler, Inhale 2 puffs into the lungs every 6 (six) hours as needed for wheezing or shortness of breath., Disp: 8 g, Rfl: 0   amiodarone (PACERONE) 200 MG tablet, Take 0.5 tablets (100 mg total) by mouth daily., Disp: 45 tablet, Rfl: 1   aspirin EC 81 MG tablet, Take 1 tablet (81 mg total) by mouth daily. Swallow whole., Disp: 90 tablet, Rfl: 1   atorvastatin (LIPITOR) 80 MG tablet, Take 1 tablet (80 mg total) by mouth daily., Disp: 30 tablet, Rfl: 3   dapagliflozin propanediol (FARXIGA) 10 MG TABS tablet, Take 1 tablet (10 mg total) by mouth daily., Disp: 90 tablet, Rfl: 1   digoxin (LANOXIN) 0.125 MG tablet, Take 0.5 tablets (0.0625 mg total) by mouth daily., Disp: 15 tablet, Rfl: 3   escitalopram (LEXAPRO) 10 MG tablet, TAKE ONE TABLET BY MOUTH ONCE DAILY, Disp: 90 tablet, Rfl: 1   esomeprazole (NEXIUM) 40 MG capsule, Take 40 mg by mouth daily., Disp: , Rfl:    ezetimibe (ZETIA) 10 MG tablet, Take 0.5 tablets (5 mg total) by mouth daily., Disp: 15 tablet, Rfl: 3   Fluticasone-Umeclidin-Vilant (TRELEGY ELLIPTA) 100-62.5-25 MCG/ACT AEPB, Inhale 1 puff into  the lungs daily., Disp: 1 each, Rfl: 2   gabapentin (NEURONTIN) 600 MG tablet, Take 1 tablet (600 mg total) by mouth at bedtime., Disp: 90 tablet, Rfl: 1   latanoprost (XALATAN) 0.005 % ophthalmic solution, Place 1 drop into both eyes at bedtime., Disp: , Rfl:    midodrine (PROAMATINE) 10 MG tablet, Take 1 tablet (10 mg total) by mouth 3 (three) times daily., Disp: 90 tablet, Rfl: 6   nitroGLYCERIN (NITROSTAT) 0.4 MG SL tablet, Place 1 tablet (0.4 mg total) under the tongue every 5 (five) minutes as  needed for chest pain., Disp: 25 tablet, Rfl: 3   OVER THE COUNTER MEDICATION, Apply 1 application  topically at bedtime. Medication: Mag Maxx Cream Back and restless leg, Disp: , Rfl:    potassium chloride SA (KLOR-CON M) 20 MEQ tablet, Take 2 tablets (40 mEq total) by mouth daily., Disp: 30 tablet, Rfl: 6   spironolactone (ALDACTONE) 25 MG tablet, Take 1 tablet (25 mg total) by mouth daily., Disp: 60 tablet, Rfl: 3   torsemide (DEMADEX) 20 MG tablet, Take 3 tablets (60 mg total) by mouth daily., Disp: 60 tablet, Rfl: 3   traZODone (DESYREL) 50 MG tablet, Take 1 tablet (50 mg total) by mouth at bedtime., Disp: 30 tablet, Rfl: 2   warfarin (COUMADIN) 1 MG tablet, Take 2 tablets to 3 tablets by mouth daily as directed by [facility] (Patient taking differently: Take 2-3 mg by mouth See admin instructions. Take 3 mg Monday, Wednesday and Friday. All the other days take 2 mg  in the PM as directed by [facility]), Disp: 200 tablet, Rfl: 0  Cognitive Assessment Identity Confirmed: : Name; DOB Cognitive Status: Normal Other:  : HIP[employer] verified on behalf of Bonita Quin, who is wife and DRP   Functional Assessment Hearing Difficulty or Deaf: no (had ears cleaned out recently) Wear Glasses or Blind: no (goes to eye care- [facility], right eye therapy- injections) Concentrating, Remembering or Making Decisions Difficulty (CP): no Difficulty Communicating: no Difficulty Eating/Swallowing: no Walking or Climbing Stairs Difficulty: yes Walking or Climbing Stairs: ambulation difficulty, requires equipment Mobility Management: uses a cane when wallking, high fall risk Dressing/Bathing Difficulty: yes Dressing/Bathing: bathing difficulty, assistance 1 person Dressing/Bathing Management: bathroom not safe and needs help Doing Errands Independently Difficulty (such as shopping) (CP): yes Errands Management: dependent on wife to help with errands Change in Functional Status Since Onset of Current  Illness/Injury: no   Caregiver Assessment  Primary Source of Support/Comfort: spouse Name of Support/Comfort Primary Source: Jeffren Dombek, spouse People in Home: spouse Name(s) of People in Home: Jadin Creque- wife Family Caregiver if Needed: spouse Family Caregiver Names: Bonita Quin Primary Roles/Responsibilities: retired Expected Impact of Illness/Hospitalization: needs financial assistance and concerned about paying bills and getting things they need Concerns About Impact on Relationships: concerned about his health   Planned Interventions  Basic overview and discussion of pathophysiology of Heart Failure reviewed Provided education on low sodium diet Reviewed Heart Failure Action Plan in depth and provided written copy Assessed need for readable accurate scales in home Provided education about placing scale on hard, flat surface Advised patient to weigh each morning after emptying bladder Discussed importance of daily weight and advised patient to weigh and record daily Reviewed role of diuretics in prevention of fluid overload and management of heart failure Discussed the importance of keeping all appointments with provider Referral made to community resources care guide team for assistance with resources in his area to help with healthy food  options, and help with financial burdens Advised patient to discuss changes in edema, swelling, or other questions and concerns related to Heart Health with provider Screening for signs and symptoms of depression related to chronic disease state  Assessed social determinant of health barriers The patient is having issues with RLS and the gabapentin is not working per his wife. She states he is all over the bed at night. Collaboration with the pcp and pharm D on recommendations to help with RLS Assessed the patients wife, Bonita Quin  understanding of chronic kidney disease    Evaluation of current treatment plan related to chronic kidney disease self  management and patient's adherence to plan as established by provider      Provided education to patient re: stroke prevention, s/s of heart attack and stroke    Reviewed prescribed diet heart healthy Reviewed medications with patient and discussed importance of compliance    Advised patient, providing education and rationale, to monitor blood pressure daily and record, calling PCP for findings outside established parameters    Discussed complications of poorly controlled blood pressure such as heart disease, stroke, circulatory complications, vision complications, kidney impairment, sexual dysfunction    Reviewed scheduled/upcoming provider appointments including: 05-12-2022 at 1145 am    Advised patient to discuss changes in kidney function with provider    Discussed plans with patient for ongoing care management follow up and provided patient with direct contact information for care management team    Screening for signs and symptoms of depression related to chronic disease state      Discussed the impact of chronic kidney disease on daily life and mental health and acknowledged and normalized feelings of disempowerment, fear, and frustration    Assessed social determinant of health barriers    Provided education on kidney disease progression    Engage patient in early, proactive and ongoing discussion about goals of care and what matters most to them    Support coping and stress management by recognizing current strategies and assist in developing new strategies such as mindfulness, journaling, relaxation techniques, problem-solving     Evaluation of current treatment plan related to hypertension self management and patient's adherence to plan as established by provider;   Provided education to patient re: stroke prevention, s/s of heart attack and stroke; Reviewed prescribed diet heart healthy Reviewed medications with patient and discussed importance of compliance;  Discussed plans with  patient for ongoing care management follow up and provided patient with direct contact information for care management team; Advised patient, providing education and rationale, to monitor blood pressure daily and record, calling PCP for findings outside established parameters. The patients wife states that his blood pressures have been running better at 110's over 60's. She states it has been as low as 89/60. Review of monitoring for falls and safety concerns and light headedness or dizziness. The patient has a history of frequent falls Reviewed scheduled/upcoming provider appointments including: 05-12-2022 with pcp Advised patient to discuss changes in HTN or heart health with provider; Provided education on prescribed diet hear healthy diet, also a care guide referral placed for assistance with food resources due to financial difficulties ;  Discussed complications of poorly controlled blood pressure such as heart disease, stroke, circulatory complications, vision complications, kidney impairment, sexual dysfunction;  Screening for signs and symptoms of depression related to chronic disease state;  Assessed social determinant of health barriers  Interaction and coordination with outside resources, practitioners, and providers See CCM Referral  Care Plan: Available in  MyChart

## 2022-04-18 NOTE — Telephone Encounter (Signed)
   Telephone encounter was:  Successful.  04/18/2022 Name: Keith Jenkins MRN: [id number] DOB: [birth year]  Keith Jenkins is a 76 y.o. year old male who is a primary care patient of [name], NP . The community resource team was consulted for assistance with Food Insecurity, Home Modifications, and Financial Difficulties related to utilities.  Care guide performed the following interventions: Spoke with patient's wife Keith Jenkins about [facility], Independent Living/Vocational Rehab, food pantries and utility assistance.  Keith Jenkins gave consent to send [facility] referral to Independent Living. Verified home address to mail information [address], [place] [address].  Follow Up Plan:  Care guide will follow up with patient by phone over the next 7 days.  Keith Jenkins [facility]  [facility] Population Health Community Resource Care Guide   [email or web address]  ?? [phone]   Website: [email or web address]  [email or web address]

## 2022-04-18 NOTE — Chronic Care Management (AMB) (Signed)
 Chronic Care Management Provider Comprehensive Care Plan    04/18/2022 Name: Keith Jenkins MRN: [id number] DOB: 03/18/1946  Referral to Chronic Care Management (CCM) services was placed by Provider:  Shannon Heaton on Date: 04-17-2022.  Chronic Condition 1: HF Provider Assessment and Plan -continue Amiodarone, Digoxin 0.125 mg daily, Spironolactone 12.5 mg daily, Torsemide 40 mg daily  s/p late presentation inferior STEMI 05/23 - Echo (5/23): EF 25-30%, RV moderately reduced, moderate to severe MR. - Limited echo on admit (5/23): EF 30-35%, RV moderately reduced, MR not well evaluated, moderate pericardial effusion - S/p CABG + bioprosthetic MVR 11/01/21 - worse NYHA III-IIIb, mildly volume up on exam and ReDS 53%. - Increase torsemide to 40 mg bid w/ extra 20 kcl x 3 days, then back to torsemide 40 mg daily. - Continue digoxin 0.0625.  Check Dig level today.  - Continue Farxiga 10 mg daily.  - Continue spiro 12.5 mg daily.  - Continue midodrine 10 mg tid for now.  - No room for arb/bb while on midodrine, add back as able.  - Labs today. - Repeat echo soon, discussed with Dr. Bensimhon.   Expected Outcome/Goals Addressed This Visit (Provider CCM goals/Provider Assessment and plan   CCM (HEART FAILURE)  EXPECTED OUTCOME:  MONITOR, SELF-MANAGE AND REDUCE SYMPTOMS OF HEART FAILURE   Symptom Management Condition 1: Take all medications as prescribed Attend all scheduled provider appointments Call provider office for new concerns or questions  Work with the social worker to address care coordination needs and will continue to work with the clinical team to address health care and disease management related needs call the Suicide and Crisis Lifeline: 988 call the [place] National Suicide Prevention Lifeline: 1-800-273-8255 or TTY: 1-800-799-4 TTY (1-800-799-4889) to talk to a trained counselor call 1-800-273-TALK (toll free, 24 hour hotline) if experiencing a Mental Health or Behavioral  Health Crisis  call office if I gain more than 2 pounds in one day or 5 pounds in one week keep legs up while sitting track weight in diary use salt in moderation watch for swelling in feet, ankles and legs every day weigh myself daily begin a heart failure diary bring diary to all appointments develop a rescue plan follow rescue plan if symptoms flare-up track symptoms and what helps feel better or worse dress right for the weather, hot or cold  Chronic Condition 2: CKD Provider Assessment and Plan -continue Amiodarone, Digoxin 0.125 mg daily, Spironolactone 12.5 mg daily, Torsemide 40 mg daily    Expected Outcome/Goals Addressed This Visit (Provider CCM goals/Provider Assessment and plan   CCM (KIDNEY FAILURE)  EXPECTED OUTCOME:  MONITOR, SELF-MANAGE AND REDUCE SYMPTOMS OF KIDNEY FAILURE  Symptom Management Condition 2: Take all medications as prescribed Attend all scheduled provider appointments Call provider office for new concerns or questions  Work with the social worker to address care coordination needs and will continue to work with the clinical team to address health care and disease management related needs call the Suicide and Crisis Lifeline: 988 call the [place] National Suicide Prevention Lifeline: 1-800-273-8255 or TTY: 1-800-799-4 TTY (1-800-799-4889) to talk to a trained counselor call 1-800-273-TALK (toll free, 24 hour hotline) if experiencing a Mental Health or Behavioral Health Crisis   Chronic Condition 3: HTN Provider Assessment and Plan continue Lipitor 80 mg QD and Zetia 10 mg QD -continue heart healthy diet   Expected Outcome/Goals Addressed This Visit (Provider CCM goals/Provider Assessment and plan   CCM (HYPERTENSION)  EXPECTED OUTCOME:  MONITOR,SELF- MANAGE AND REDUCE SYMPTOMS   OF HYPERTENSION  Symptom Management Condition 3: Take all medications as prescribed Attend all scheduled provider appointments Call provider office for new concerns or  questions  Work with the social worker to address care coordination needs and will continue to work with the clinical team to address health care and disease management related needs call the Suicide and Crisis Lifeline: 988 call the [place] National Suicide Prevention Lifeline: 1-800-273-8255 or TTY: 1-800-799-4 TTY (1-800-799-4889) to talk to a trained counselor call 1-800-273-TALK (toll free, 24 hour hotline) if experiencing a Mental Health or Behavioral Health Crisis  check blood pressure daily write blood pressure results in a log or diary learn about high blood pressure keep a blood pressure log take blood pressure log to all doctor appointments call doctor for signs and symptoms of high blood pressure keep all doctor appointments take medications for blood pressure exactly as prescribed report new symptoms to your doctor   Problem List Patient Active Problem List   Diagnosis Date Noted   Prediabetes 04/14/2022   Atherosclerosis of aorta (HCC) 04/14/2022   Abnormal esophagram    Stricture and stenosis of esophagus    Acute on chronic systolic heart failure (HCC) 03/15/2022   Acute CHF (congestive heart failure) (HCC) 03/12/2022   Heat syncope 02/24/2022   Contusion of scalp 02/24/2022   Scalp laceration 02/24/2022   Fall on same level 02/24/2022   Need for influenza vaccination 02/24/2022   Microcytic anemia 02/24/2022   Stage 3b chronic kidney disease (CKD) (HCC) 02/24/2022   Long term (current) use of anticoagulants 11/26/2021   Mitral valve replaced 11/26/2021   Esophageal dysphagia    Mitral regurgitation 11/01/2021   S/P mitral valve replacement 11/01/2021   Acute on chronic combined systolic (congestive) and diastolic (congestive) heart failure (HCC) 10/24/2021   Pericardial effusion    Severe mitral regurgitation    NSTEMI (non-ST elevated myocardial infarction) (HCC) 10/10/2021   Non-ST elevation (NSTEMI) myocardial infarction (HCC) 10/10/2021   Acute systolic  heart failure (HCC) 10/10/2021   Hyperlipidemia 10/10/2021    Medication Management  Current Outpatient Medications:    albuterol (PROVENTIL) (2.5 MG/3ML) 0.083% nebulizer solution, Inhale 3 mLs into the lungs every 6 (six) hours as needed for wheezing or shortness of breath., Disp: 90 mL, Rfl: 12   albuterol (VENTOLIN HFA) 108 (90 Base) MCG/ACT inhaler, Inhale 2 puffs into the lungs every 6 (six) hours as needed for wheezing or shortness of breath., Disp: 8 g, Rfl: 0   amiodarone (PACERONE) 200 MG tablet, Take 0.5 tablets (100 mg total) by mouth daily., Disp: 45 tablet, Rfl: 1   aspirin EC 81 MG tablet, Take 1 tablet (81 mg total) by mouth daily. Swallow whole., Disp: 90 tablet, Rfl: 1   atorvastatin (LIPITOR) 80 MG tablet, Take 1 tablet (80 mg total) by mouth daily., Disp: 30 tablet, Rfl: 3   dapagliflozin propanediol (FARXIGA) 10 MG TABS tablet, Take 1 tablet (10 mg total) by mouth daily., Disp: 90 tablet, Rfl: 1   digoxin (LANOXIN) 0.125 MG tablet, Take 0.5 tablets (0.0625 mg total) by mouth daily., Disp: 15 tablet, Rfl: 3   escitalopram (LEXAPRO) 10 MG tablet, TAKE ONE TABLET BY MOUTH ONCE DAILY, Disp: 90 tablet, Rfl: 1   esomeprazole (NEXIUM) 40 MG capsule, Take 40 mg by mouth daily., Disp: , Rfl:    ezetimibe (ZETIA) 10 MG tablet, Take 0.5 tablets (5 mg total) by mouth daily., Disp: 15 tablet, Rfl: 3   Fluticasone-Umeclidin-Vilant (TRELEGY ELLIPTA) 100-62.5-25 MCG/ACT AEPB, Inhale 1 puff into   the lungs daily., Disp: 1 each, Rfl: 2   gabapentin (NEURONTIN) 600 MG tablet, Take 1 tablet (600 mg total) by mouth at bedtime., Disp: 90 tablet, Rfl: 1   latanoprost (XALATAN) 0.005 % ophthalmic solution, Place 1 drop into both eyes at bedtime., Disp: , Rfl:    midodrine (PROAMATINE) 10 MG tablet, Take 1 tablet (10 mg total) by mouth 3 (three) times daily., Disp: 90 tablet, Rfl: 6   nitroGLYCERIN (NITROSTAT) 0.4 MG SL tablet, Place 1 tablet (0.4 mg total) under the tongue every 5 (five) minutes as  needed for chest pain., Disp: 25 tablet, Rfl: 3   OVER THE COUNTER MEDICATION, Apply 1 application  topically at bedtime. Medication: Mag Maxx Cream Back and restless leg, Disp: , Rfl:    potassium chloride SA (KLOR-CON M) 20 MEQ tablet, Take 2 tablets (40 mEq total) by mouth daily., Disp: 30 tablet, Rfl: 6   spironolactone (ALDACTONE) 25 MG tablet, Take 1 tablet (25 mg total) by mouth daily., Disp: 60 tablet, Rfl: 3   torsemide (DEMADEX) 20 MG tablet, Take 3 tablets (60 mg total) by mouth daily., Disp: 60 tablet, Rfl: 3   traZODone (DESYREL) 50 MG tablet, Take 1 tablet (50 mg total) by mouth at bedtime., Disp: 30 tablet, Rfl: 2   warfarin (COUMADIN) 1 MG tablet, Take 2 tablets to 3 tablets by mouth daily as directed by [facility] (Patient taking differently: Take 2-3 mg by mouth See admin instructions. Take 3 mg Monday, Wednesday and Friday. All the other days take 2 mg  in the PM as directed by [facility]), Disp: 200 tablet, Rfl: 0  Cognitive Assessment Identity Confirmed: : Name; DOB Cognitive Status: Normal Other:  : HIP[employer] verified on behalf of [name], who is wife and DRP   Functional Assessment Hearing Difficulty or Deaf: no (had ears cleaned out recently) Wear Glasses or Blind: no (goes to eye care- [facility], right eye therapy- injections) Concentrating, Remembering or Making Decisions Difficulty (CP): no Difficulty Communicating: no Difficulty Eating/Swallowing: no Walking or Climbing Stairs Difficulty: yes Walking or Climbing Stairs: ambulation difficulty, requires equipment Mobility Management: uses a cane when wallking, high fall risk Dressing/Bathing Difficulty: yes Dressing/Bathing: bathing difficulty, assistance 1 person Dressing/Bathing Management: bathroom not safe and needs help Doing Errands Independently Difficulty (such as shopping) (CP): yes Errands Management: dependent on wife to help with errands Change in Functional Status Since Onset of Current  Illness/Injury: no   Caregiver Assessment  Primary Source of Support/Comfort: spouse Name of Support/Comfort Primary Source: [name] Moors, spouse People in Home: spouse Name(s) of People in Home: [name] Nunnelley- wife Family Caregiver if Needed: spouse Family Caregiver Names: [name] Primary Roles/Responsibilities: retired Expected Impact of Illness/Hospitalization: needs financial assistance and concerned about paying bills and getting things they need Concerns About Impact on Relationships: concerned about his health   Planned Interventions  Basic overview and discussion of pathophysiology of Heart Failure reviewed Provided education on low sodium diet Reviewed Heart Failure Action Plan in depth and provided written copy Assessed need for readable accurate scales in home Provided education about placing scale on hard, flat surface Advised patient to weigh each morning after emptying bladder Discussed importance of daily weight and advised patient to weigh and record daily Reviewed role of diuretics in prevention of fluid overload and management of heart failure Discussed the importance of keeping all appointments with provider Referral made to community resources care guide team for assistance with resources in his area to help with healthy food   options, and help with financial burdens Advised patient to discuss changes in edema, swelling, or other questions and concerns related to Heart Health with provider Screening for signs and symptoms of depression related to chronic disease state  Assessed social determinant of health barriers The patient is having issues with RLS and the gabapentin is not working per his wife. She states he is all over the bed at night. Collaboration with the pcp and pharm D on recommendations to help with RLS Assessed the patients wife, [name]  understanding of chronic kidney disease    Evaluation of current treatment plan related to chronic kidney disease self  management and patient's adherence to plan as established by provider      Provided education to patient re: stroke prevention, s/s of heart attack and stroke    Reviewed prescribed diet heart healthy Reviewed medications with patient and discussed importance of compliance    Advised patient, providing education and rationale, to monitor blood pressure daily and record, calling PCP for findings outside established parameters    Discussed complications of poorly controlled blood pressure such as heart disease, stroke, circulatory complications, vision complications, kidney impairment, sexual dysfunction    Reviewed scheduled/upcoming provider appointments including: 05-12-2022 at 1145 am    Advised patient to discuss changes in kidney function with provider    Discussed plans with patient for ongoing care management follow up and provided patient with direct contact information for care management team    Screening for signs and symptoms of depression related to chronic disease state      Discussed the impact of chronic kidney disease on daily life and mental health and acknowledged and normalized feelings of disempowerment, fear, and frustration    Assessed social determinant of health barriers    Provided education on kidney disease progression    Engage patient in early, proactive and ongoing discussion about goals of care and what matters most to them    Support coping and stress management by recognizing current strategies and assist in developing new strategies such as mindfulness, journaling, relaxation techniques, problem-solving     Evaluation of current treatment plan related to hypertension self management and patient's adherence to plan as established by provider;   Provided education to patient re: stroke prevention, s/s of heart attack and stroke; Reviewed prescribed diet heart healthy Reviewed medications with patient and discussed importance of compliance;  Discussed plans with  patient for ongoing care management follow up and provided patient with direct contact information for care management team; Advised patient, providing education and rationale, to monitor blood pressure daily and record, calling PCP for findings outside established parameters. The patients wife states that his blood pressures have been running better at 110's over 60's. She states it has been as low as 89/60. Review of monitoring for falls and safety concerns and light headedness or dizziness. The patient has a history of frequent falls Reviewed scheduled/upcoming provider appointments including: 05-12-2022 with pcp Advised patient to discuss changes in HTN or heart health with provider; Provided education on prescribed diet hear healthy diet, also a care guide referral placed for assistance with food resources due to financial difficulties ;  Discussed complications of poorly controlled blood pressure such as heart disease, stroke, circulatory complications, vision complications, kidney impairment, sexual dysfunction;  Screening for signs and symptoms of depression related to chronic disease state;  Assessed social determinant of health barriers  Interaction and coordination with outside resources, practitioners, and providers See CCM Referral  Care Plan: Available in   MyChart      

## 2022-04-18 NOTE — Chronic Care Management (AMB) (Signed)
Chronic Care Management   CCM RN Visit Note  04/18/2022 Name: Keith Jenkins MRN: [id number] DOB: 04/11/1946  Subjective: Keith Jenkins is a 76 y.o. year old male who is a primary care patient of [name], NP. The patient was referred to the Chronic Care Management team for assistance with care management needs subsequent to provider initiation of CCM services and plan of care.    Today's Visit:   spoke to the patients wife Keith Jenkins  for initial visit.     SDOH Interventions Today    Flowsheet Row Most Recent Value  SDOH Interventions   Food Insecurity Interventions Other (Comment)  [states it is getting harder to get food, is receptive to talking to care guides for food resources]  Housing Interventions Other (Comment)  [has repairs that are needed in the home and cannot afford, open to talking to careguides for assistance with resources in their area]  Transportation Interventions Intervention Not Indicated  Utilities Interventions Ambulatory REF2300 Order  [needs assistance with utilities]  Alcohol Usage Interventions Intervention Not Indicated (Score <7)  Financial Strain Interventions Other (Comment)  [careguide referral for resources in the area to help with financial assistance, food, and utilities]  Social Connections Interventions Intervention Not Indicated         Goals Addressed             This Visit's Progress    CCM (KIDNEY FAILURE)  EXPECTED OUTCOME:  MONITOR, SELF-MANAGE AND REDUCE SYMPTOMS OF KIDNEY FAILURE       Current Barriers:  Knowledge Deficits related to the importance of normalized blood pressures, compliance with medications, and monitoring for changes in the patients urinary and kidney function Care Coordination needs related to financial constraints and the need for assistance with basic needs for health and well being  in a patient with CKD Chronic Disease Management support and education needs related to effective management of CKD Lacks  caregiver support.  Corporate treasurer.   Planned Interventions: Assessed the patients wife, Keith Jenkins  understanding of chronic kidney disease    Evaluation of current treatment plan related to chronic kidney disease self management and patient's adherence to plan as established by provider      Provided education to patient re: stroke prevention, s/s of heart attack and stroke    Reviewed prescribed diet heart healthy Reviewed medications with patient and discussed importance of compliance    Advised patient, providing education and rationale, to monitor blood pressure daily and record, calling PCP for findings outside established parameters    Discussed complications of poorly controlled blood pressure such as heart disease, stroke, circulatory complications, vision complications, kidney impairment, sexual dysfunction    Reviewed scheduled/upcoming provider appointments including: 05-12-2022 at 1145 am    Advised patient to discuss changes in kidney function with provider    Discussed plans with patient for ongoing care management follow up and provided patient with direct contact information for care management team    Screening for signs and symptoms of depression related to chronic disease state      Discussed the impact of chronic kidney disease on daily life and mental health and acknowledged and normalized feelings of disempowerment, fear, and frustration    Assessed social determinant of health barriers    Provided education on kidney disease progression    Engage patient in early, proactive and ongoing discussion about goals of care and what matters most to them    Support coping and stress management by recognizing current strategies and assist in  developing new strategies such as mindfulness, journaling, relaxation techniques, problem-solving     Symptom Management: Take medications as prescribed   Attend all scheduled provider appointments Call provider office for new concerns or  questions  call the Suicide and Crisis Lifeline: 988 call the [place] National Suicide Prevention Lifeline: (434)094-9311 or TTY: 724-709-9476 TTY (614)216-3214) to talk to a trained counselor call 1-800-273-TALK (toll free, 24 hour hotline) if experiencing a Mental Health or [place]   Follow Up Plan: Telephone follow up appointment with care management team member scheduled for: 05-19-2022 at 1145 am       CCM Expected Outcome:  Monitor, Self-Manage and Reduce Symptoms of Heart Failure       Current Barriers:  Knowledge Deficits related to the importance of monitoring for sx and sx of heart failure and to prevent exacerbations Care Coordination needs related to resources needed to maintain health and well being  in a patient with CHF and other chronic conditions Chronic Disease Management support and education needs related to CHF Lacks caregiver support.  Film/video editor.   Planned Interventions: Basic overview and discussion of pathophysiology of Heart Failure reviewed Provided education on low sodium diet Reviewed Heart Failure Action Plan in depth and provided written copy Assessed need for readable accurate scales in home Provided education about placing scale on hard, flat surface Advised patient to weigh each morning after emptying bladder Discussed importance of daily weight and advised patient to weigh and record daily Reviewed role of diuretics in prevention of fluid overload and management of heart failure Discussed the importance of keeping all appointments with provider Referral made to community resources care guide team for assistance with resources in his area to help with healthy food options, and help with financial burdens Advised patient to discuss changes in edema, swelling, or other questions and concerns related to [place] with provider Screening for signs and symptoms of depression related to chronic disease state  Assessed social  determinant of health barriers The patient is having issues with RLS and the gabapentin is not working per his wife. She states he is all over the bed at night. Collaboration with the pcp and pharm D on recommendations to help with RLS  Symptom Management: Take medications as prescribed   Attend all scheduled provider appointments Call provider office for new concerns or questions  Work with the social worker to address care coordination needs and will continue to work with the clinical team to address health care and disease management related needs call the Suicide and Crisis Lifeline: 988 call the [place] National Suicide Prevention Lifeline: 347-648-8409 or TTY: (501) 120-2124 TTY (408)722-8318) to talk to a trained counselor call 1-800-273-TALK (toll free, 24 hour hotline) if experiencing a Mental Health or [place]  call office if I gain more than 2 pounds in one day or 5 pounds in one week keep legs up while sitting track weight in diary use salt in moderation watch for swelling in feet, ankles and legs every day weigh myself daily begin a heart failure diary bring diary to all appointments develop a rescue plan follow rescue plan if symptoms flare-up track symptoms and what helps feel better or worse dress right for the weather, hot or cold  Follow Up Plan: Telephone follow up appointment with care management team member scheduled for: 05-19-2022 at 1145 am       CCM Expected Outcome:  Monitor, Self-Manage, and Reduce Symptoms of Hypertension       Current Barriers:  Knowledge Deficits related to the importance of normalized blood pressures and monitoring for changes in blood pressures in a patient with HTN and also hypotension at times  Care Coordination needs related to financial needs and resources  in a patient with HTN and other chronic conditions Chronic Disease Management support and education needs related to effective management of HTN Lacks caregiver  support.  Film/video editor.   Planned Interventions: Evaluation of current treatment plan related to hypertension self management and patient's adherence to plan as established by provider;   Provided education to patient re: stroke prevention, s/s of heart attack and stroke; Reviewed prescribed diet heart healthy Reviewed medications with patient and discussed importance of compliance;  Discussed plans with patient for ongoing care management follow up and provided patient with direct contact information for care management team; Advised patient, providing education and rationale, to monitor blood pressure daily and record, calling PCP for findings outside established parameters. The patients wife states that his blood pressures have been running better at 110's over 60's. She states it has been as low as 89/60. Review of monitoring for falls and safety concerns and light headedness or dizziness. The patient has a history of frequent falls Reviewed scheduled/upcoming provider appointments including: 05-12-2022 with pcp Advised patient to discuss changes in HTN or heart health with provider; Provided education on prescribed diet hear healthy diet, also a care guide referral placed for assistance with food resources due to financial difficulties ;  Discussed complications of poorly controlled blood pressure such as heart disease, stroke, circulatory complications, vision complications, kidney impairment, sexual dysfunction;  Screening for signs and symptoms of depression related to chronic disease state;  Assessed social determinant of health barriers;   Symptom Management: Take medications as prescribed   Attend all scheduled provider appointments Call provider office for new concerns or questions  Work with the social worker to address care coordination needs and will continue to work with the clinical team to address health care and disease management related needs call the Suicide and  Crisis Lifeline: 988 call the [place] National Suicide Prevention Lifeline: (616)285-5386 or TTY: 604-421-5016 TTY (925)594-4845) to talk to a trained counselor call 1-800-273-TALK (toll free, 24 hour hotline) if experiencing a Mental Health or [place]  check blood pressure 3 times per week write blood pressure results in a log or diary learn about high blood pressure keep a blood pressure log take blood pressure log to all doctor appointments call doctor for signs and symptoms of high blood pressure develop an action plan for high blood pressure keep all doctor appointments take medications for blood pressure exactly as prescribed report new symptoms to your doctor  Follow Up Plan: Telephone follow up appointment with care management team member scheduled for: 05-19-2022 at 1145 am          Plan:Telephone follow up appointment with care management team member scheduled for:  05-19-2022 at 1145 am  Noreene Larsson RN, MSN, CCM RN Care Manager  Chronic Care Management Direct Number: [phone]

## 2022-04-18 NOTE — Progress Notes (Signed)
I have scheduled patient for 04/24/2022 @ 8:30am

## 2022-04-21 ENCOUNTER — Other Ambulatory Visit (HOSPITAL_COMMUNITY): Payer: Self-pay | Admitting: Cardiology

## 2022-04-21 ENCOUNTER — Ambulatory Visit: Payer: Self-pay | Admitting: Licensed Clinical Social Worker

## 2022-04-21 NOTE — Patient Outreach (Signed)
  Care Coordination   04/21/2022 Name: Keith Jenkins MRN: [id number] DOB: Jan 23, 1946   Care Coordination Outreach Attempts:  An unsuccessful telephone outreach was attempted today to offer the patient information about available care coordination services as a benefit of their health plan.   Follow Up Plan:  Additional outreach attempts will be made to offer the patient care coordination information and services.   Encounter Outcome:  No Answer  Care Coordination Interventions Activated:  No   Care Coordination Interventions:  No, not indicated    Christen Butter, BSW, MSW, LCSW-A  Social Worker [facility] Care Management  [phone]

## 2022-04-22 ENCOUNTER — Other Ambulatory Visit: Payer: Self-pay

## 2022-04-22 ENCOUNTER — Ambulatory Visit: Payer: Self-pay | Admitting: Licensed Clinical Social Worker

## 2022-04-22 ENCOUNTER — Telehealth: Payer: Self-pay

## 2022-04-22 ENCOUNTER — Encounter: Payer: Self-pay | Admitting: Internal Medicine

## 2022-04-22 ENCOUNTER — Ambulatory Visit: Payer: Medicare HMO | Attending: Cardiology

## 2022-04-22 DIAGNOSIS — Z952 Presence of prosthetic heart valve: Secondary | ICD-10-CM

## 2022-04-22 DIAGNOSIS — Z7901 Long term (current) use of anticoagulants: Secondary | ICD-10-CM | POA: Diagnosis not present

## 2022-04-22 LAB — POCT INR: INR: 1.4 — AB (ref 2.0–3.0)

## 2022-04-22 MED ORDER — WARFARIN SODIUM 1 MG PO TABS: ORAL_TABLET | ORAL | 0 refills | Status: DC

## 2022-04-22 NOTE — Telephone Encounter (Signed)
   Telephone encounter was:  Successful.  04/22/2022 Name: Keith Jenkins MRN: [id number] DOB: [birth year]  Roosevelt Eimers is a 76 y.o. year old male who is a primary care patient of [name], NP . The community resource team was consulted for assistance with Home Modifications  Care guide performed the following interventions: Received message via [email or web address] from [facility] she has spoken to the patient's wife about bathroom modification, repair to kitchen floor and a ramp out of the front door.   Follow Up Plan:  No further follow up planned at this time. The patient has been provided with needed resources.  Khandi Kernes [facility]  [facility] Population Health Community Resource Care Guide   [email or web address]  ?? [phone]   Website: [email or web address]  [email or web address]

## 2022-04-22 NOTE — Telephone Encounter (Signed)
Prescription refill request received for warfarin Lov: 03/31/22 (CHF) Next INR check: 04/29/22 Warfarin tablet strength: 1mg   Appropriate dose and refill sent to requested pharmacy.

## 2022-04-22 NOTE — Patient Instructions (Signed)
Description   Take 3 tablets today and then START taking 3 tablets daily EXCEPT 2 tablets on Tuesdays and Thursdays.  Recheck INR 1 week.  [facility] [phone] Fax Clearance form to [phone]

## 2022-04-22 NOTE — Progress Notes (Signed)
Chronic Care Management Pharmacy Assistant   Name: Keith Jenkins  MRN: [id number] DOB: [birth year]   Reason for Encounter: Chart review for CPP visit on 04-24-2022   Conditions to be addressed/monitored: CHF, HLD, and DMII  Recent office visits:  04-17-2022 [name], RN (CCM).  04-14-2022 Rip Harbour, NP. START Gabapentin 600 mg at bedtime.   02-19-2022 Rochel Brome, MD. Flu vaccine given.   01-06-2022 Rip Harbour, NP. Hemo= 10.3, Hema= 32.2, MCV= 75, MCH= 24.0, RDW= 17.1. Creatinine= 1.32, eGFR= 57, Chloride= 93. HDL= 36  Recent consult visits:  04-15-2022 Leonidas Romberg, RN (Cardiology). Anti coag visit.  04-08-2022 Jerene Bears, MD. ESOPHAGOGASTRODUODENOSCOPY (EGD) WITH PROPOFOL procedure.   04-01-2022 Leonidas Romberg, RN (Cardiology). Anti coag visit.  03-31-2022 Rafael Bihari, FNP (Vascular). B Natriuretic Peptide= 1,681.5. Sodium= 134, Glucose= 135, BUN= 32, Creatinine= 1.91, Calcium= 8.7, GFR, Estimated= 36. RBC= 4.10, Hemo= 10.2, HCT= 30.7, MCV= 74.9, MCH= 24.9, RDW= 29.9.   03-25-2022 Leonidas Romberg, RN (Cardiology). Anti coag visit.  03-12-2022 Bensimhon, Shaune Pascal, MD  (Cardiology). RIGHT HEART CATH and THORACENTESIS procedures.  03-12-2022 Rafael Bihari, FNP (Vascular). B natriuretic peptide= 1,417.5. RBC= 3.96, Hemo= 8.5, HCT= 26.9, MCV= 67.9, MCH= 21.5, RDW= 19.2. BUN= 34, Creatinine= 1.96, Albumin= 3.2, Egfr= 35.  03-11-2022 Leonidas Romberg, RN (Cardiology). Anti coag visit.  03-04-2022 Leonidas Romberg, RN (Cardiology). Anti coag visit.  02-25-2022 Leonidas Romberg, RN (Cardiology). Anti coag visit.  02-18-2022 Leonidas Romberg, RN (Cardiology). Anti coag visit.  02-14-2022 Rafael Bihari, FNP. B natriuretic peptide= 1, 321.3. Glucose= 100, Creatinine= 1.59, Gfr= 45.  02-11-2022 Hemphill, Candance B, RN (Cardiology). Anti coag visit.  02-04-2022 Hemphill, Myrtis Hopping, RN (Cardiology). Anti coag  visit.  02-04-2022 Pyrtle, Lajuan Lines, MD Gertie Fey). Follow up visit.  01-22-2022 Gaye Pollack, MD (Cardiothoracic Surgery). Follow up.  01-21-2022 Leonidas Romberg, RN (Cardiology). Anti coag visit.  01-14-2022 Leonidas Romberg, RN (Cardiology). Anti coag visit.  01-14-2022 Scarlette Calico, RN (Vascular). DECREASE Midodrine to 5 mg Three times a day and START Zetia 5 mg (1/2 tab) Daily.  01-07-2022 Leonidas Romberg, RN (Cardiology). Anti coag visit.  12-31-2021 Leonidas Romberg, RN (Cardiology). Anti coag visit.  12-24-2021 Leonidas Romberg, RN (Cardiology). Anti coag visit.  12-17-2021 Leonidas Romberg, RN (Cardiology). Anti coag visit.  12-11-2021 Leonidas Romberg, RN (Cardiology). Anti coag visit.  12-05-2021 Gaye Pollack, MD (Cardiothoracic). Follow up visit.  12-04-2021 Rafael Bihari, FNP (Vascular). B Natriuretic Peptide= 1,268.7. Chloride= 96, Creatinine= 1.36, Calcium= 8.5, Egfr= 55.  12-03-2021 Leonidas Romberg, RN (Cardiology). Anti coag visit.  11-26-2021 Leonidas Romberg, RN (Cardiology). Anti coag visit.  Hospital visits:  Medication Reconciliation was completed by comparing discharge summary, patient's EMR and Pharmacy list, and upon discussion with patient.  Admitted to the hospital on 02-20-2022 due to Fall encounter. Discharge date was 02-20-2022. Discharged from [place]?Medications Started at [facility] Discharge:?? None  Medication Changes at Hospital Discharge: None  Medications Discontinued at Hospital Discharge: None  Medications that remain the same after Hospital Discharge:??  -All other medications will remain the same.    Hospital visits:  Medication Reconciliation was completed by comparing discharge summary, patient's EMR and Pharmacy list, and upon discussion with patient.  Admitted to the hospital on 02-12-2022 due to Scalp laceration. Discharge date was 02-12-2022. Discharged from [place]?Medications Started at [facility] Discharge:??  None  Medication Changes at Hospital Discharge: None  Medications Discontinued at Hospital Discharge: None  Medications that remain the same after Hospital Discharge:??  -All other medications will remain the same.    Hospital visits:  Medication Reconciliation was completed by comparing discharge summary, patient's EMR and Pharmacy list, and upon discussion with patient.  Admitted to the hospital on 01-28-2022 due to Fall encounter. Discharge date was 01-28-2022. Discharged from [place]?Medications Started at [facility] Discharge:?? None  Medication Changes at Hospital Discharge: None  Medications Discontinued at Hospital Discharge: None  Medications that remain the same after Hospital Discharge:??  -All other medications will remain the same.    Medications: Outpatient Encounter Medications as of 04/22/2022  Medication Sig Note   albuterol (PROVENTIL) (2.5 MG/3ML) 0.083% nebulizer solution Inhale 3 mLs into the lungs every 6 (six) hours as needed for wheezing or shortness of breath.    albuterol (VENTOLIN HFA) 108 (90 Base) MCG/ACT inhaler Inhale 2 puffs into the lungs every 6 (six) hours as needed for wheezing or shortness of breath.    amiodarone (PACERONE) 200 MG tablet Take 0.5 tablets (100 mg total) by mouth daily.    aspirin EC 81 MG tablet Take 1 tablet (81 mg total) by mouth daily. Swallow whole.    atorvastatin (LIPITOR) 80 MG tablet Take 1 tablet (80 mg total) by mouth daily.    dapagliflozin propanediol (FARXIGA) 10 MG TABS tablet Take 1 tablet (10 mg total) by mouth daily.    digoxin (LANOXIN) 0.125 MG tablet Take 0.5 tablets (0.0625 mg total) by mouth daily.    escitalopram (LEXAPRO) 10 MG tablet TAKE ONE TABLET BY MOUTH ONCE DAILY    esomeprazole (NEXIUM) 40 MG capsule Take 40 mg by mouth daily.    ezetimibe (ZETIA) 10 MG tablet Take 0.5 tablets (5 mg total) by mouth daily.     Fluticasone-Umeclidin-Vilant (TRELEGY ELLIPTA) 100-62.5-25 MCG/ACT AEPB Inhale 1 puff into the lungs daily.    gabapentin (NEURONTIN) 600 MG tablet Take 1 tablet (600 mg total) by mouth at bedtime.    latanoprost (XALATAN) 0.005 % ophthalmic solution Place 1 drop into both eyes at bedtime.    midodrine (PROAMATINE) 10 MG tablet Take 1 tablet (10 mg total) by mouth 3 (three) times daily.    nitroGLYCERIN (NITROSTAT) 0.4 MG SL tablet Place 1 tablet (0.4 mg total) under the tongue every 5 (five) minutes as needed for chest pain.    OVER THE COUNTER MEDICATION Apply 1 application  topically at bedtime. Medication: Mag Maxx Cream Back and restless leg    potassium chloride SA (KLOR-CON M) 20 MEQ tablet Take 2 tablets (40 mEq total) by mouth daily.    spironolactone (ALDACTONE) 25 MG tablet Take 1 tablet (25 mg total) by mouth daily.    torsemide (DEMADEX) 20 MG tablet Take 3 tablets (60 mg total) by mouth daily.    traZODone (DESYREL) 50 MG tablet Take 1 tablet (50 mg total) by mouth at bedtime.    warfarin (COUMADIN) 1 MG tablet Take 2 tablets to 3 tablets by mouth daily as directed by [facility] (Patient taking differently: Take 2-3 mg by mouth See admin instructions. Take 3 mg Monday, Wednesday and Friday. All the other days take 2 mg  in the PM as directed by [facility]madin Clinic) 04/04/2022: On hold until after procedure   No facility-administered encounter medications on file as of 04/22/2022.   Lab Results  Component Value Date/Time   HGBA1C 6.0 (H)  03/18/2022 05:58 AM   HGBA1C 6.0 (H) 11/01/2021 04:30 AM     BP Readings from Last 3 Encounters:  04/14/22 112/64  04/08/22 95/60  03/31/22 102/64      Have you seen any other providers since your last visit with PCP? No. Patient's wife said no other   What is your top health concern to discuss at your upcoming visit? Patient's wife stated he falls often. Patient's wife is also concerned about hospital/medical bills.  Do you have any  problems getting your medications from the pharmacy? Yes  Financial barriers? Yes Access barriers? No   If yes, please explain: Patient's wife stated amiodirone, farxiga, trellegy copays are expensive. Will start patient assistance application for trelegy, amiodarone and farxiga to mail out.   Chandan Broner was reminded to have all medications, supplements and any blood glucose and/or blood pressure readings available for review with Arizona Constable, Pharm. D, at his telephone visit on 04-24-2022 at 8:30.   Care Gaps: Last Annual Wellness visit? None Last Colonoscopy? None Last Mammogram? None Last Bone Denisty? None AWV never done Shingrix overdue Colonoscopy Covid booster overdue  Last eye exam / retinopathy screening? [facility] this year Last diabetic foot exam? None   Star Rating Drugs: Atorvastatin 80 mg- Last filled 03-28-2022 30 DS. Previous 03-01-2022 30 DS. Farxiga 10 mg- Last filled 04-09-2022 30 DS. Previous 03-11-2022 30 DS  [place] Clinical Pharmacist Assistant [phone]

## 2022-04-22 NOTE — Patient Outreach (Signed)
  Care Coordination   04/22/2022 Name: Keith Jenkins MRN: [id number] DOB: 11/08/1945   Care Coordination Outreach Attempts:  A second unsuccessful outreach was attempted today to offer the patient with information about available care coordination services as a benefit of their health plan.     Follow Up Plan:  Additional outreach attempts will be made to offer the patient care coordination information and services.   Encounter Outcome:  No Answer  Care Coordination Interventions Activated:  No   Care Coordination Interventions:  No, not indicated    Christen Butter, BSW, MSW, LCSW-A  Social Worker [facility] Care Management  [phone]

## 2022-04-23 ENCOUNTER — Other Ambulatory Visit: Payer: Self-pay

## 2022-04-23 DIAGNOSIS — R1319 Other dysphagia: Secondary | ICD-10-CM

## 2022-04-24 ENCOUNTER — Ambulatory Visit: Payer: Medicare HMO

## 2022-04-24 DIAGNOSIS — I252 Old myocardial infarction: Secondary | ICD-10-CM | POA: Diagnosis not present

## 2022-04-24 DIAGNOSIS — D509 Iron deficiency anemia, unspecified: Secondary | ICD-10-CM | POA: Diagnosis not present

## 2022-04-24 DIAGNOSIS — I350 Nonrheumatic aortic (valve) stenosis: Secondary | ICD-10-CM | POA: Diagnosis not present

## 2022-04-24 DIAGNOSIS — I5022 Chronic systolic (congestive) heart failure: Secondary | ICD-10-CM

## 2022-04-24 DIAGNOSIS — E782 Mixed hyperlipidemia: Secondary | ICD-10-CM

## 2022-04-24 DIAGNOSIS — K222 Esophageal obstruction: Secondary | ICD-10-CM | POA: Diagnosis not present

## 2022-04-24 DIAGNOSIS — I480 Paroxysmal atrial fibrillation: Secondary | ICD-10-CM | POA: Diagnosis not present

## 2022-04-24 DIAGNOSIS — H409 Unspecified glaucoma: Secondary | ICD-10-CM | POA: Diagnosis not present

## 2022-04-24 DIAGNOSIS — N1832 Chronic kidney disease, stage 3b: Secondary | ICD-10-CM | POA: Diagnosis not present

## 2022-04-24 DIAGNOSIS — Z954 Presence of other heart-valve replacement: Secondary | ICD-10-CM | POA: Diagnosis not present

## 2022-04-24 DIAGNOSIS — Z87891 Personal history of nicotine dependence: Secondary | ICD-10-CM | POA: Diagnosis not present

## 2022-04-24 DIAGNOSIS — K219 Gastro-esophageal reflux disease without esophagitis: Secondary | ICD-10-CM | POA: Diagnosis not present

## 2022-04-24 DIAGNOSIS — Z951 Presence of aortocoronary bypass graft: Secondary | ICD-10-CM | POA: Diagnosis not present

## 2022-04-24 DIAGNOSIS — Z9181 History of falling: Secondary | ICD-10-CM | POA: Diagnosis not present

## 2022-04-24 DIAGNOSIS — E785 Hyperlipidemia, unspecified: Secondary | ICD-10-CM | POA: Diagnosis not present

## 2022-04-24 DIAGNOSIS — Z7901 Long term (current) use of anticoagulants: Secondary | ICD-10-CM | POA: Diagnosis not present

## 2022-04-24 DIAGNOSIS — I5023 Acute on chronic systolic (congestive) heart failure: Secondary | ICD-10-CM | POA: Diagnosis not present

## 2022-04-24 DIAGNOSIS — I10 Essential (primary) hypertension: Secondary | ICD-10-CM

## 2022-04-24 DIAGNOSIS — M1990 Unspecified osteoarthritis, unspecified site: Secondary | ICD-10-CM | POA: Diagnosis not present

## 2022-04-24 DIAGNOSIS — Z7982 Long term (current) use of aspirin: Secondary | ICD-10-CM | POA: Diagnosis not present

## 2022-04-24 DIAGNOSIS — I2510 Atherosclerotic heart disease of native coronary artery without angina pectoris: Secondary | ICD-10-CM | POA: Diagnosis not present

## 2022-04-24 DIAGNOSIS — R1319 Other dysphagia: Secondary | ICD-10-CM | POA: Diagnosis not present

## 2022-04-24 DIAGNOSIS — J449 Chronic obstructive pulmonary disease, unspecified: Secondary | ICD-10-CM | POA: Diagnosis not present

## 2022-04-24 NOTE — Progress Notes (Signed)
Chronic Care Management Pharmacy Note  04/24/2022 Name:  Keith Jenkins MRN:  [id number] DOB:  June 19, 1945  Summary: -Very very very pleasant couple presents for initial CCM visit. They have been married for 56 years. He worked as a Administrator. [name], his wife, grew up in [place]. Writes poems. Read me one she just finished called "Dust on the Bible"   Recommendations/Changes made from today's visit: -Start PAP for Trelegy and Dapagliflozin -Coordinated with Patient's Pharmacy to get $40 credit for Midodrine -Onboard to Pharmacy -Coordinated with Cardio (See CP) for grant -Due for AWV. Will let PCP know   Subjective: Keith Jenkins is an 76 y.o. year old male who is a primary patient of Rip Harbour, NP.  The CCM team was consulted for assistance with disease management and care coordination needs.    Engaged with patient by telephone for initial visit in response to provider referral for pharmacy case management and/or care coordination services.   Consent to Services:  The patient was given the following information about Chronic Care Management services today, agreed to services, and gave verbal consent: 1. CCM service includes personalized support from designated clinical staff supervised by the primary care provider, including individualized plan of care and coordination with other care providers 2. 24/7 contact phone numbers for assistance for urgent and routine care needs. 3. Service will only be billed when office clinical staff spend 20 minutes or more in a month to coordinate care. 4. Only one practitioner may furnish and bill the service in a calendar month. 5.The patient may stop CCM services at any time (effective at the end of the month) by phone call to the office staff. 6. The patient will be responsible for cost sharing (co-pay) of up to 20% of the service fee (after annual deductible is met). Patient agreed to services and consent obtained.  Patient Care  Team: Rip Harbour, NP as PCP - General (Nurse Practitioner) Haroldine Laws Shaune Pascal, MD as PCP - Advanced Heart Failure (Cardiology) [name], RN as Case Manager ([place]) Lane Hacker, [facility] (Pharmacist)  Recent office visits:  04-17-2022 [name], RN (CCM).   04-14-2022 Rip Harbour, NP. START Gabapentin 600 mg at bedtime.    02-19-2022 Rochel Brome, MD. Flu vaccine given.    01-06-2022 Rip Harbour, NP. Hemo= 10.3, Hema= 32.2, MCV= 75, MCH= 24.0, RDW= 17.1. Creatinine= 1.32, eGFR= 57, Chloride= 93. HDL= 36   Recent consult visits:  04-15-2022 Leonidas Romberg, RN (Cardiology). Anti coag visit.   04-08-2022 Jerene Bears, MD. ESOPHAGOGASTRODUODENOSCOPY (EGD) WITH PROPOFOL procedure.    04-01-2022 Leonidas Romberg, RN (Cardiology). Anti coag visit.   03-31-2022 Rafael Bihari, FNP (Vascular). B Natriuretic Peptide= 1,681.5. Sodium= 134, Glucose= 135, BUN= 32, Creatinine= 1.91, Calcium= 8.7, GFR, Estimated= 36. RBC= 4.10, Hemo= 10.2, HCT= 30.7, MCV= 74.9, MCH= 24.9, RDW= 29.9.    03-25-2022 Leonidas Romberg, RN (Cardiology). Anti coag visit.   03-12-2022 Bensimhon, Shaune Pascal, MD  (Cardiology). RIGHT HEART CATH and THORACENTESIS procedures.   03-12-2022 Rafael Bihari, FNP (Vascular). B natriuretic peptide= 1,417.5. RBC= 3.96, Hemo= 8.5, HCT= 26.9, MCV= 67.9, MCH= 21.5, RDW= 19.2. BUN= 34, Creatinine= 1.96, Albumin= 3.2, Egfr= 35.   03-11-2022 Leonidas Romberg, RN (Cardiology). Anti coag visit.   03-04-2022 Leonidas Romberg, RN (Cardiology). Anti coag visit.   02-25-2022 Leonidas Romberg, RN (Cardiology). Anti coag visit.   02-18-2022 Leonidas Romberg, RN (Cardiology). Anti coag visit.   02-14-2022  [place], Maricela Bo, [place]. B natriuretic peptide= 1, 321.3. Glucose= 100, Creatinine= 1.59, Gfr= 45.   02-11-2022 Hemphill, Candance B, RN (Cardiology). Anti coag visit.   02-04-2022 Hemphill, Myrtis Hopping, RN (Cardiology). Anti coag  visit.   02-04-2022 Pyrtle, Lajuan Lines, MD Gertie Fey). Follow up visit.   01-22-2022 Gaye Pollack, MD (Cardiothoracic Surgery). Follow up.   01-21-2022 Leonidas Romberg, RN (Cardiology). Anti coag visit.   01-14-2022 Leonidas Romberg, RN (Cardiology). Anti coag visit.   01-14-2022 Scarlette Calico, RN (Vascular). DECREASE Midodrine to 5 mg Three times a day and START Zetia 5 mg (1/2 tab) Daily.   01-07-2022 Leonidas Romberg, RN (Cardiology). Anti coag visit.   12-31-2021 Leonidas Romberg, RN (Cardiology). Anti coag visit.   12-24-2021 Leonidas Romberg, RN (Cardiology). Anti coag visit.   12-17-2021 Leonidas Romberg, RN (Cardiology). Anti coag visit.   12-11-2021 Leonidas Romberg, RN (Cardiology). Anti coag visit.   12-05-2021 Gaye Pollack, MD (Cardiothoracic). Follow up visit.   12-04-2021 Rafael Bihari, FNP (Vascular). B Natriuretic Peptide= 1,268.7. Chloride= 96, Creatinine= 1.36, Calcium= 8.5, Egfr= 55.   12-03-2021 Leonidas Romberg, RN (Cardiology). Anti coag visit.   11-26-2021 Leonidas Romberg, RN (Cardiology). Anti coag visit.   Hospital visits:  Medication Reconciliation was completed by comparing discharge summary, patient's EMR and Pharmacy list, and upon discussion with patient.   Admitted to the hospital on 02-20-2022 due to Fall encounter. Discharge date was 02-20-2022. Discharged from [place]?Medications Started at [facility] Discharge:?? None   Medication Changes at Hospital Discharge: None   Medications Discontinued at Hospital Discharge: None   Medications that remain the same after Hospital Discharge:??  -All other medications will remain the same.     Hospital visits:  Medication Reconciliation was completed by comparing discharge summary, patient's EMR and Pharmacy list, and upon discussion with patient.   Admitted to the hospital on 02-12-2022 due to Scalp laceration. Discharge date was 02-12-2022. Discharged from  [place]?Medications Started at [facility] Discharge:?? None   Medication Changes at Hospital Discharge: None   Medications Discontinued at Hospital Discharge: None   Medications that remain the same after Hospital Discharge:??  -All other medications will remain the same.     Hospital visits:  Medication Reconciliation was completed by comparing discharge summary, patient's EMR and Pharmacy list, and upon discussion with patient.   Admitted to the hospital on 01-28-2022 due to Fall encounter. Discharge date was 01-28-2022. Discharged from [place]?Medications Started at [facility] Discharge:?? None   Medication Changes at Hospital Discharge: None   Medications Discontinued at Hospital Discharge: None   Medications that remain the same after Hospital Discharge:??  -All other medications will remain the same.     Objective:  Lab Results  Component Value Date   CREATININE 1.91 (H) 03/31/2022   BUN 32 (H) 03/31/2022   EGFR 57 (L) 01/06/2022   GFRNONAA 36 (L) 03/31/2022   GFRAA 65 03/27/2020   NA 134 (L) 03/31/2022   K 3.6 03/31/2022   CALCIUM 8.7 (L) 03/31/2022   CO2 25 03/31/2022   GLUCOSE 135 (H) 03/31/2022    Lab Results  Component Value Date/Time   HGBA1C 6.0 (H) 03/18/2022 05:58 AM   HGBA1C 6.0 (H) 11/01/2021 04:30 AM    Last diabetic Eye exam: No results found for: "HMDIABEYEEXA"  Last diabetic Foot exam: No results found for: "HMDIABFOOTEX"  Lab Results  Component Value Date   CHOL 75 03/18/2022   HDL 33 (L) 03/18/2022   LDLCALC 29 03/18/2022   TRIG 63 03/18/2022   CHOLHDL 2.3 03/18/2022       Latest Ref Rng & Units 03/18/2022    5:56 AM 03/12/2022   12:20 PM 02/12/2022    5:06 PM  Hepatic Function  Total Protein 6.5 - 8.1 g/dL 6.9  6.7  5.9   Albumin 3.5 - 5.0 g/dL 3.3  3.2  2.7   AST 15 - 41 U/L 33  35  35   ALT 0 - 44 U/L '27  30  24   ' Alk Phosphatase 38 - 126 U/L 89  90  91   Total Bilirubin 0.3 - 1.2  mg/dL 1.2  1.1  1.0   Bilirubin, Direct 0.0 - 0.2 mg/dL 0.2       Lab Results  Component Value Date/Time   TSH 1.049 03/18/2022 05:58 AM   TSH 0.691 03/12/2022 12:14 PM   TSH 1.100 03/27/2020 11:16 AM   FREET4 1.24 (H) 03/18/2022 05:58 AM       Latest Ref Rng & Units 03/31/2022    2:41 PM 03/17/2022    3:25 PM 03/17/2022    3:21 PM  CBC  WBC 4.0 - 10.5 K/uL 5.2     Hemoglobin 13.0 - 17.0 g/dL 10.2  10.2  9.5   Hematocrit 39.0 - 52.0 % 30.7  30.0  28.0   Platelets 150 - 400 K/uL 198       Lab Results  Component Value Date/Time   VITAMINB12 606 03/13/2022 07:56 AM    Clinical ASCVD: Yes  The ASCVD Risk score (Arnett DK, et al., 2019) failed to calculate for the following reasons:   The patient has a prior MI or stroke diagnosis       04/14/2022   10:06 AM 10/23/2021   11:56 AM 07/09/2021   10:05 AM  Depression screen PHQ 2/9  Decreased Interest 0 0 0  Down, Depressed, Hopeless 0 1 0  PHQ - 2 Score 0 1 0  Altered sleeping 0 1   Tired, decreased energy 0 1   Change in appetite 0 1   Feeling bad or failure about yourself  0 1   Trouble concentrating 0 0   Moving slowly or fidgety/restless 0 0   Suicidal thoughts 0 0   PHQ-9 Score 0 5   Difficult doing work/chores Not difficult at all Not difficult at all      Other: (CHADS2VASc if Afib, MMRC or CAT for COPD, ACT, DEXA)  Social History   Tobacco Use  Smoking Status Former   Types: Cigarettes   Quit date: 1970   Years since quitting: 53.9  Smokeless Tobacco Never   BP Readings from Last 3 Encounters:  04/14/22 112/64  04/08/22 95/60  03/31/22 102/64   Pulse Readings from Last 3 Encounters:  04/14/22 61  04/08/22 (!) 55  03/31/22 65   Wt Readings from Last 3 Encounters:  04/14/22 188 lb (85.3 kg)  04/08/22 177 lb (80.3 kg)  03/31/22 189 lb 12.8 oz (86.1 kg)   BMI Readings from Last 3 Encounters:  04/14/22 24.80 kg/m  04/08/22 23.35 kg/m  03/31/22 25.04 kg/m    Assessment/Interventions: Review  of patient past medical history, allergies, medications, health status, including review of consultants reports, laboratory and other test data, was performed as part of comprehensive evaluation and provision of chronic care management services.   SDOH:  (  Social Determinants of Health) assessments and interventions performed: Yes SDOH Interventions    Flowsheet Row Chronic Care Management from 04/24/2022 in [place] Telephone from 04/18/2022 in [place] Management from 04/17/2022 in [place] Office Visit from 04/14/2022 in [place] Telephone from 03/19/2022 in [place] Coordination Admission (Discharged) from 03/12/2022 in [place] Interventions        Food Insecurity Interventions -- Other (Comment)  [Mailed food pantry list] Other (Comment)  [states it is getting harder to get food, is receptive to talking to care guides for food resources] -- Intervention Not Indicated --  Housing Interventions -- -- Other (Comment)  [has repairs that are needed in the home and cannot afford, open to talking to careguides for assistance with resources in their area] -- Intervention Not Indicated  [resides with spouse] --  Transportation Interventions Intervention Not Indicated -- Intervention Not Indicated -- Intervention Not Indicated  [spouse/ family provides transportation] --  Utilities Interventions -- Other (Comment)  [Mailed utility assistance resources] Ambulatory [id number] Order  [needs assistance with utilities] -- -- --  Alcohol Usage Interventions -- -- Intervention Not Indicated (Score <7) -- -- Intervention Not Indicated (Score <7)  Financial Strain Interventions Other (Comment)  [PAP] -- Other (Comment)  [careguide referral for resources in the area to help with financial assistance, food, and utilities] Intervention Not Indicated -- --  Physical Activity  Interventions -- -- -- Intervention Not Indicated -- --  Stress Interventions -- -- -- Intervention Not Indicated -- --  Social Connections Interventions -- -- Intervention Not Indicated -- -- --      SDOH Screenings   Food Insecurity: No Food Insecurity (04/18/2022)  Housing: Low Risk  (04/17/2022)  Transportation Needs: No Transportation Needs (04/24/2022)  Utilities: Not At Risk (04/18/2022)  Alcohol Screen: Low Risk  (04/17/2022)  Depression (PHQ2-9): Low Risk  (04/14/2022)  Financial Resource Strain: High Risk (04/24/2022)  Physical Activity: Sufficiently Active (04/14/2022)  Social Connections: Moderately Integrated (04/17/2022)  Stress: No Stress Concern Present (04/14/2022)  Tobacco Use: Medium Risk (04/14/2022)    CCM Care Plan  No Known Allergies  Medications Reviewed Today     Reviewed by Lane Hacker, [facility] (Pharmacist) on 04/24/22 at 1155  Med List Status: <None>   Medication Order Taking? Sig Documenting Provider Last Dose Status Informant  albuterol (PROVENTIL) (2.5 MG/3ML) 0.083% nebulizer solution [id number] No Inhale 3 mLs into the lungs every 6 (six) hours as needed for wheezing or shortness of breath. Earnie Larsson, NP 04/07/2022 Active Spouse/Significant Other  albuterol (VENTOLIN HFA) 108 (90 Base) MCG/ACT inhaler [id number]  Inhale 2 puffs into the lungs every 6 (six) hours as needed for wheezing or shortness of breath. Rip Harbour, NP  Active   amiodarone (PACERONE) 200 MG tablet [id number] No Take 0.5 tablets (100 mg total) by mouth daily. [place], [place] 04/07/2022 Active Spouse/Significant Other  aspirin EC 81 MG tablet [id number] No Take 1 tablet (81 mg total) by mouth daily. Swallow whole. [place], [place] 04/07/2022 Active Spouse/Significant Other  atorvastatin (LIPITOR) 80 MG tablet [id number] No Take 1 tablet (80 mg total) by mouth daily. BensimhonShaune Pascal, MD 04/07/2022 Active Spouse/Significant Other  dapagliflozin propanediol  (FARXIGA) 10 MG TABS tablet [id number] No Take 1 tablet (10 mg total) by mouth daily. BensimhonShaune Pascal, MD 04/07/2022 Active Spouse/Significant Other  digoxin (LANOXIN) 0.125 MG tablet [id number] No Take  0.5 tablets (0.0625 mg total) by mouth daily. Lyda Jester M, PA-C 04/07/2022 Active Spouse/Significant Other  escitalopram (LEXAPRO) 10 MG tablet [id number] No TAKE ONE TABLET BY MOUTH ONCE DAILY Cox, Kirsten, MD 04/07/2022 Active Spouse/Significant Other  esomeprazole (NEXIUM) 40 MG capsule [id number] No Take 40 mg by mouth daily. [provider] 04/07/2022 Active Spouse/Significant Other  ezetimibe (ZETIA) 10 MG tablet [id number] No Take 0.5 tablets (5 mg total) by mouth daily. Lyda Jester M, PA-C 04/07/2022 Active Spouse/Significant Other  Fluticasone-Umeclidin-Vilant (TRELEGY ELLIPTA) 100-62.5-25 MCG/ACT AEPB [id number]  Inhale 1 puff into the lungs daily. Rip Harbour, NP  Active   gabapentin (NEURONTIN) 600 MG tablet [id number]  Take 1 tablet (600 mg total) by mouth at bedtime. Rip Harbour, NP  Active   latanoprost (XALATAN) 0.005 % ophthalmic solution [id number] No Place 1 drop into both eyes at bedtime. [provider] 04/07/2022 Active Spouse/Significant Other  midodrine (PROAMATINE) 10 MG tablet [id number] No Take 1 tablet (10 mg total) by mouth 3 (three) times daily. Allena Katz Leigh, [place] 04/07/2022 Active Spouse/Significant Other  nitroGLYCERIN (NITROSTAT) 0.4 MG SL tablet [id number] No Place 1 tablet (0.4 mg total) under the tongue every 5 (five) minutes as needed for chest pain. [place], [place], FNP More than a month Active Spouse/Significant Other  OVER THE COUNTER MEDICATION [id number] No Apply 1 application  topically at bedtime. Medication: Mag Maxx Cream Back and restless leg [provider] 04/07/2022 Active Spouse/Significant Other  potassium chloride SA (KLOR-CON M) 20 MEQ tablet [id number]  Take 2 tablets (40 mEq total) by mouth  daily. Lyda Jester M, PA-C  Active   spironolactone (ALDACTONE) 25 MG tablet [id number] No Take 1 tablet (25 mg total) by mouth daily. Earnie Larsson, NP 04/07/2022 Active Spouse/Significant Other  torsemide (DEMADEX) 20 MG tablet [id number] No Take 3 tablets (60 mg total) by mouth daily. Earnie Larsson, NP 04/07/2022 Active Spouse/Significant Other  traZODone (DESYREL) 50 MG tablet [id number] No Take 1 tablet (50 mg total) by mouth at bedtime. Lillard Anes, MD 04/07/2022 Active Spouse/Significant Other  warfarin (COUMADIN) 1 MG tablet [id number]  Take 2 tablets to 3 tablets by mouth daily as directed by [facility] Bensimhon, Shaune Pascal, MD  Active             Patient Active Problem List   Diagnosis Date Noted   Prediabetes 04/14/2022   Atherosclerosis of aorta ([place]) 04/14/2022   Abnormal esophagram    Stricture and stenosis of esophagus    Acute on chronic systolic heart failure ([place]) 03/15/2022   Acute CHF (congestive heart failure) ([place]) 03/12/2022   Heat syncope 02/24/2022   Contusion of scalp 02/24/2022   Scalp laceration 02/24/2022   Fall on same level 02/24/2022   Need for influenza vaccination 02/24/2022   Microcytic anemia 02/24/2022   Stage 3b chronic kidney disease (CKD) ([place]) 02/24/2022   Long term (current) use of anticoagulants 11/26/2021   Mitral valve replaced 11/26/2021   Esophageal dysphagia    Mitral regurgitation 11/01/2021   S/P mitral valve replacement 11/01/2021   Acute on chronic combined systolic (congestive) and diastolic (congestive) heart failure ([place]) 10/24/2021   Pericardial effusion    Severe mitral regurgitation    NSTEMI (non-ST elevated myocardial infarction) ([place]) 10/10/2021   Non-ST elevation (NSTEMI) myocardial infarction ([place]) 80/16/5537   Acute systolic heart failure ([place]) 10/10/2021   Hyperlipidemia 10/10/2021    Immunization History  Administered Date(s) Administered   COVID-19, mRNA, vaccine(Comirnaty)12 years and older  04/14/2022  Fluad Quad(high Dose 65+) 05/20/2021, 02/19/2022   PNEUMOCOCCAL CONJUGATE-20 07/09/2021   Pfizer Covid-19 Vaccine Bivalent Booster 43yr & up 05/20/2021   Tdap 01/28/2022    Conditions to be addressed/monitored:  Hypertension, Hyperlipidemia, and Diabetes  Care Plan : [place] Updates made by KLane Hacker RMineralsince 04/24/2022 12:00 AM     Problem: Disease State Management   Priority: High  Onset Date: 04/24/2022     Long-Range Goal: DM, HF, Lipids   Start Date: 04/24/2022  Expected End Date: 04/24/2023  This Visit's Progress: On track  Priority: High  Note:   Current Barriers:  Does not contact provider office for questions/concerns  Pharmacist Clinical Goal(s):  Patient will achieve adherence to monitoring guidelines and medication adherence to achieve therapeutic efficacy through collaboration with PharmD and provider.   Interventions: 1:1 collaboration with HRip Harbour NP regarding development and update of comprehensive plan of care as evidenced by provider attestation and co-signature Inter-disciplinary care team collaboration (see longitudinal plan of care) Comprehensive medication review performed; medication list updated in electronic medical record  CAD: (LDL goal < 70) - LHC 05/23: 100% m RCA and 90% m LAD.  - Echo (5/23) EF 25%, RV moderately reduced, moderate to severe MR - s/p CABG x 2 (LIMA-LAD, SVG-[place] INT)  (11/01/21) The ASCVD Risk score (Arnett DK, et al., 2019) failed to calculate for the following reasons:   The patient has a prior MI or stroke diagnosis Lab Results  Component Value Date   CHOL 75 03/18/2022   CHOL 131 01/06/2022   CHOL 194 07/09/2021   Lab Results  Component Value Date   HDL 33 (L) 03/18/2022   HDL 36 (L) 01/06/2022   HDL 55 07/09/2021   Lab Results  Component Value Date   LDLCALC 29 03/18/2022   LDLCALC 79 01/06/2022   LDLCALC 129 (H) 07/09/2021   Lab Results  Component Value  Date   TRIG 63 03/18/2022   TRIG 79 01/06/2022   TRIG 55 07/09/2021   Lab Results  Component Value Date   CHOLHDL 2.3 03/18/2022   CHOLHDL 3.6 01/06/2022   CHOLHDL 3.5 07/09/2021  No results found for: "LDLDIRECT" Last vitamin D No results found for: "25OHVITD2", "25OHVITD3", "VD25OH" Lab Results  Component Value Date   TSH 1.049 03/18/2022  -Controlled -Current treatment: Atorvastatin 850mAppropriate, Effective, Safe, Accessible ASA 8154mppropriate, Effective, Safe, Accessible Ezetimibe 40m8mpropriate, Effective, Safe, Accessible -Medications previously tried: N/A  -Current dietary patterns: "Tries to eat healthy" -Current exercise habits: N/A -Educated on Cholesterol goals;  -Recommended to continue current medication  Pre-Diabetes (A1c goal <6.5%) Lab Results  Component Value Date   HGBA1C 6.0 (H) 03/18/2022   HGBA1C 6.0 (H) 11/01/2021   HGBA1C 5.8 (H) 07/09/2021   Lab Results  Component Value Date   LDLCALC 29 03/18/2022   CREATININE 1.91 (H) 03/31/2022   Lab Results  Component Value Date   NA 134 (L) 03/31/2022   K 3.6 03/31/2022   CREATININE 1.91 (H) 03/31/2022   EGFR 57 (L) 01/06/2022   GFRNONAA 36 (L) 03/31/2022   GLUCOSE 135 (H) 03/31/2022   Lab Results  Component Value Date   WBC 5.2 03/31/2022   HGB 10.2 (L) 03/31/2022   HCT 30.7 (L) 03/31/2022   MCV 74.9 (L) 03/31/2022   PLT 198 03/31/2022   No results found for: "LABMICR", "MICROALBUR" -Controlled -Current medications: Dapagliflozin Appropriate, Effective, Safe, Query accessible PAP Started for 2023 on 04/22/22 -Medications previously tried: N/A  -  Current home glucose readings fasting glucose: Doesn't test -Denies hypoglycemic/hyperglycemic symptoms -Educated on A1c and blood sugar goals; -Counseled to check feet daily and get yearly eye exams -November 2023: Start PAP for Dapagliflozin  Atrial Fibrillation (Goal: prevent stroke and major bleeding) -Controlled -CHADSVASC:  6 -Current treatment: Rhythm control:  Amiodarone 165m QD Appropriate, Effective, Safe, Accessible Digoxin 0.06260mAppropriate, Effective, Safe, Accessible Anticoagulation:  Warfarin UD Appropriate, Effective, Safe, Accessible -Medications previously tried: N/A -Home BP and HR readings: Doesn't test  -Counseled on increased risk of stroke due to Afib and benefits of anticoagulation for stroke prevention; November 2023: Will have CCM team look into potential of a DOAC and see if patient has spent 3% on meds. If so, will contact Cardio  Heart Failure (Goal: manage symptoms and prevent exacerbations) -Controlled -Last ejection fraction:  EF 25-30 % per ECHO, attends HF clinic s/p late presentation inferior STEMI 05/23 Echo (5/23): EF 25-30%, RV moderately reduced, moderate to severe MR. 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 Echo 03/12/22 showed EF 20-25%,  -HF type: HFrEF (EF < 40%) -NYHA Class: IV (significant limitation of activity / dyspnea at rest) -AHA HF Stage: D (Advanced disease requiring aggressive medical therapy) -Current treatment: Dapagliflozin 1056mppropriate, Effective, Safe, Query accessible PAP started on 04/22/22 for 2023 Digoxin 0.625m22mpropriate, Effective, Safe, Accessible Midrodine 10mg37m Appropriate, Effective, Safe, Query accessible $100 at pharmacy KCl Appropriate, Effective, Safe, Accessible Spironolactone 25mg 29mopriate, Effective, Safe, Accessible Torsemide 20mg A74mpriate, Effective, Safe, Accessible -Medications previously tried: N/A  -Current home BP/HR readings: N/A -Current dietary habits: "Tries to eat healthy" -Current exercise habits: N/A -Educated on Benefits of medications for managing symptoms and prolonging life November 2023: Star Farxiga PAP. Called [place] with Jerry, Sonia Sidegoing to credit patient $40  (They accidentally messed up charging for patient. Unsure of details). Coordinated with ElizabeCharlann Boxerng to work on a grant for Spironolactone and Digoxin via direct message. Will get a month's worth of samples to last  COPD (Goal: control symptoms and prevent exacerbations) -Controlled Tobacco Use: Medium Risk (04/14/2022)   Patient History    Smoking Tobacco Use: Former    Smokeless Tobacco Use: Never    Passive Exposure: Not on file    -Current treatment  Albuterol Appropriate, Effective, Safe, Accessible Trelegy Appropriate, Effective, Safe, Query accessible PAP Started for 2023 on 04/22/22 -Medications previously tried: N/A  -Gold Grade: Unknown -Current COPD Classification:  Unknown -MMRC/CAT score:      No data to display        -Pulmonary function testing:  PF Readings from Last 3 Encounters:  No data found for PF  Pulmonary Functions Testing Results: No results found for: "FEV1", "FVC", "FEV1FVC", "TLC", "DLCO"  -Patient denies consistent use of maintenance inhaler -Counseled on Proper inhaler technique; November 2023: Stared PAP for Trelegy  Cost of meds: [place]ine: $99.85 -$40 next time. Was credited Trelegy: 174 -PAP started Farxiga: 152 -PAP started Amiodarone: 14.88 Torsemide: 13.75 Warfarin: 10.75 Digoxin: 10.17 KCl: 7.17 Albuterol: 9.49   Patient Goals/Self-Care Activities Patient will:  - take medications as prescribed as evidenced by patient report and record review  Follow Up Plan: The patient has been provided with contact information for the care management team and has been advised to call with any health related questions or concerns.   CPP F/U December 2023  Divit Stipp [place] Constable.DSherian Rein36-3- [phone]  Medication Assistance:  -See CP  Compliance/Adherence/Medication fill history: Care Gaps: Last Annual Wellness visit? None Last Colonoscopy? None Last Mammogram? None Last Bone Denisty? None AWV never  done Shingrix overdue Colonoscopy Covid booster overdue   Last eye exam / retinopathy screening? [facility] this year Last diabetic foot exam? None     Star Rating Drugs: Atorvastatin 80 mg- Last filled 03-28-2022 30 DS. Previous 03-01-2022 30 DS. Farxiga 10 mg- Last filled 04-09-2022 30 DS. Previous 03-11-2022 30 DS  Patient's preferred pharmacy is:  [place], [place] [address] Phone: [phone] Fax: [phone]  Uses pill box? No -   Pt endorses 100% compliance  We discussed: Benefits of medication synchronization, packaging and delivery as well as enhanced pharmacist oversight with [facility]. Patient decided to: Utilize [facility] for medication synchronization, packaging and delivery -Verbal consent obtained for [facility] enhanced pharmacy services (medication synchronization, adherence packaging, delivery coordination). A medication sync plan was created to allow patient to get all medications delivered once every 30 to 90 days per patient preference. Patient understands they have freedom to choose pharmacy and clinical pharmacist will coordinate care between all prescribers and [facility]. -Patient wants meds delivered to her in person, not left on door. They have trouble paying for gas and would like help  Care Plan and Follow Up Patient Decision:  Patient agrees to Care Plan and Follow-up.  Plan: The patient has been provided with contact information for the care management team and has been advised to call with any health related questions or concerns.   CPP F/U December 2023  [place] Constable, Sherian Rein.D. - [phone]

## 2022-04-24 NOTE — Patient Instructions (Signed)
Visit Information   Goals Addressed   None    Patient Care Plan: CCM Pharmacy Care Plan     Problem Identified: Disease State Management   Priority: High  Onset Date: 04/24/2022     Long-Range Goal: DM, HF, Lipids   Start Date: 04/24/2022  Expected End Date: 04/24/2023  This Visit's Progress: On track  Priority: High  Note:   Current Barriers:  Does not contact provider office for questions/concerns  Pharmacist Clinical Goal(s):  Patient will achieve adherence to monitoring guidelines and medication adherence to achieve therapeutic efficacy through collaboration with PharmD and provider.   Interventions: 1:1 collaboration with Rip Harbour, NP regarding development and update of comprehensive plan of care as evidenced by provider attestation and co-signature Inter-disciplinary care team collaboration (see longitudinal plan of care) Comprehensive medication review performed; medication list updated in electronic medical record  CAD: (LDL goal < 70) - LHC 05/23: 100% m RCA and 90% m LAD.  - Echo (5/23) EF 25%, RV moderately reduced, moderate to severe MR - s/p CABG x 2 (LIMA-LAD, SVG-Ramus INT)  (11/01/21) The ASCVD Risk score (Arnett DK, et al., 2019) failed to calculate for the following reasons:   The patient has a prior MI or stroke diagnosis Lab Results  Component Value Date   CHOL 75 03/18/2022   CHOL 131 01/06/2022   CHOL 194 07/09/2021   Lab Results  Component Value Date   HDL 33 (L) 03/18/2022   HDL 36 (L) 01/06/2022   HDL 55 07/09/2021   Lab Results  Component Value Date   LDLCALC 29 03/18/2022   LDLCALC 79 01/06/2022   LDLCALC 129 (H) 07/09/2021   Lab Results  Component Value Date   TRIG 63 03/18/2022   TRIG 79 01/06/2022   TRIG 55 07/09/2021   Lab Results  Component Value Date   CHOLHDL 2.3 03/18/2022   CHOLHDL 3.6 01/06/2022   CHOLHDL 3.5 07/09/2021  No results found for: "LDLDIRECT" Last vitamin D No results found for: "25OHVITD2",  "25OHVITD3", "VD25OH" Lab Results  Component Value Date   TSH 1.049 03/18/2022  -Controlled -Current treatment: Atorvastatin 26m Appropriate, Effective, Safe, Accessible ASA 843mAppropriate, Effective, Safe, Accessible Ezetimibe 1052mppropriate, Effective, Safe, Accessible -Medications previously tried: N/A  -Current dietary patterns: "Tries to eat healthy" -Current exercise habits: N/A -Educated on Cholesterol goals;  -Recommended to continue current medication  Pre-Diabetes (A1c goal <6.5%) Lab Results  Component Value Date   HGBA1C 6.0 (H) 03/18/2022   HGBA1C 6.0 (H) 11/01/2021   HGBA1C 5.8 (H) 07/09/2021   Lab Results  Component Value Date   LDLCALC 29 03/18/2022   CREATININE 1.91 (H) 03/31/2022   Lab Results  Component Value Date   NA 134 (L) 03/31/2022   K 3.6 03/31/2022   CREATININE 1.91 (H) 03/31/2022   EGFR 57 (L) 01/06/2022   GFRNONAA 36 (L) 03/31/2022   GLUCOSE 135 (H) 03/31/2022   Lab Results  Component Value Date   WBC 5.2 03/31/2022   HGB 10.2 (L) 03/31/2022   HCT 30.7 (L) 03/31/2022   MCV 74.9 (L) 03/31/2022   PLT 198 03/31/2022   No results found for: "LABMICR", "MICROALBUR" -Controlled -Current medications: Dapagliflozin Appropriate, Effective, Safe, Query accessible PAP Started for 2023 on 04/22/22 -Medications previously tried: N/A  -Current home glucose readings fasting glucose: Doesn't test -Denies hypoglycemic/hyperglycemic symptoms -Educated on A1c and blood sugar goals; -Counseled to check feet daily and get yearly eye exams -November 2023: Start PAP for Dapagliflozin  Atrial Fibrillation (Goal:  prevent stroke and major bleeding) -Controlled -CHADSVASC: 6 -Current treatment: Rhythm control:  Amiodarone 130m QD Appropriate, Effective, Safe, Accessible Digoxin 0.06232mAppropriate, Effective, Safe, Accessible Anticoagulation:  Warfarin UD Appropriate, Effective, Safe, Accessible -Medications previously tried: N/A -Home BP  and HR readings: Doesn't test  -Counseled on increased risk of stroke due to Afib and benefits of anticoagulation for stroke prevention; November 2023: Will have CCM team look into potential of a DOAC and see if patient has spent 3% on meds. If so, will contact Cardio  Heart Failure (Goal: manage symptoms and prevent exacerbations) -Controlled -Last ejection fraction:  EF 25-30 % per ECHO, attends HF clinic s/p late presentation inferior STEMI 05/23 Echo (5/23): EF 25-30%, RV moderately reduced, moderate to severe MR. 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 Echo 03/12/22 showed EF 20-25%,  -HF type: HFrEF (EF < 40%) -NYHA Class: IV (significant limitation of activity / dyspnea at rest) -AHA HF Stage: D (Advanced disease requiring aggressive medical therapy) -Current treatment: Dapagliflozin 1033mppropriate, Effective, Safe, Query accessible PAP started on 04/22/22 for 2023 Digoxin 0.625m5mpropriate, Effective, Safe, Accessible Midrodine 10mg56m Appropriate, Effective, Safe, Query accessible $100 at pharmacy KCl Appropriate, Effective, Safe, Accessible Spironolactone 25mg 68mopriate, Effective, Safe, Accessible Torsemide 20mg A26mpriate, Effective, Safe, Accessible -Medications previously tried: N/A  -Current home BP/HR readings: N/A -Current dietary habits: "Tries to eat healthy" -Current exercise habits: N/A -Educated on Benefits of medications for managing symptoms and prolonging life November 2023: Star Farxiga PAP. Called [facility] with [employer], [employer] to credit patient $40 (They accidentally messed up charging for patient. Unsure of details). Coordinated with ElizabeCharlann Boxerng to work on a grant for Spironolactone and Digoxin via direct message. Will get a month's worth of samples to last  COPD (Goal: control symptoms and prevent  exacerbations) -Controlled Tobacco Use: Medium Risk (04/14/2022)   Patient History    Smoking Tobacco Use: Former    Smokeless Tobacco Use: Never    Passive Exposure: Not on file    -Current treatment  Albuterol Appropriate, Effective, Safe, Accessible Trelegy Appropriate, Effective, Safe, Query accessible PAP Started for 2023 on 04/22/22 -Medications previously tried: N/A  -Gold Grade: Unknown -Current COPD Classification:  Unknown -MMRC/CAT score:      No data to display        -Pulmonary function testing:  PF Readings from Last 3 Encounters:  No data found for PF  Pulmonary Functions Testing Results: No results found for: "FEV1", "FVC", "FEV1FVC", "TLC", "DLCO"  -Patient denies consistent use of maintenance inhaler -Counseled on Proper inhaler technique; November 2023: Stared PAP for Trelegy  Cost of meds: Midrodine: $99.85 -$40 next time. Was credited Trelegy: 174 -PAP started Farxiga: 152 -PAP started Amiodarone: 14.88 Torsemide: 13.75 Warfarin: 10.75 Digoxin: 10.17 KCl: 7.17 Albuterol: 9.49   Patient Goals/Self-Care Activities Patient will:  - take medications as prescribed as evidenced by patient report and record review  Follow Up Plan: The patient has been provided with contact information for the care management team and has been advised to call with any health related questions or concerns.   CPP F/U December 2023  Annisha Baar [place] Constable.DSherian Rein36-3- [phone]. Ziemann waMcferranven information about Chronic Care Management services today including:  CCM service includes personalized support from designated clinical staff supervised by his physician, including individualized plan of care and coordination with other care  providers 24/7 contact phone numbers for assistance for urgent and routine care needs. Standard insurance, coinsurance, copays and deductibles apply for chronic care management only during months in which we provide at least 20  minutes of these services. Most insurances cover these services at 100%, however patients may be responsible for any copay, coinsurance and/or deductible if applicable. This service may help you avoid the need for more expensive face-to-face services. Only one practitioner may furnish and bill the service in a calendar month. The patient may stop CCM services at any time (effective at the end of the month) by phone call to the office staff.  Patient agreed to services and verbal consent obtained.   The patient verbalized understanding of instructions, educational materials, and care plan provided today and DECLINED offer to receive copy of patient instructions, educational materials, and care plan.  The pharmacy team will reach out to the patient again over the next 60 days.   Lane Hacker, [place]

## 2022-04-25 DIAGNOSIS — R1319 Other dysphagia: Secondary | ICD-10-CM | POA: Diagnosis not present

## 2022-04-25 DIAGNOSIS — Z87891 Personal history of nicotine dependence: Secondary | ICD-10-CM | POA: Diagnosis not present

## 2022-04-25 DIAGNOSIS — Z954 Presence of other heart-valve replacement: Secondary | ICD-10-CM | POA: Diagnosis not present

## 2022-04-25 DIAGNOSIS — Z7901 Long term (current) use of anticoagulants: Secondary | ICD-10-CM | POA: Diagnosis not present

## 2022-04-25 DIAGNOSIS — Z7982 Long term (current) use of aspirin: Secondary | ICD-10-CM | POA: Diagnosis not present

## 2022-04-25 DIAGNOSIS — I2510 Atherosclerotic heart disease of native coronary artery without angina pectoris: Secondary | ICD-10-CM | POA: Diagnosis not present

## 2022-04-25 DIAGNOSIS — Z951 Presence of aortocoronary bypass graft: Secondary | ICD-10-CM | POA: Diagnosis not present

## 2022-04-25 DIAGNOSIS — I350 Nonrheumatic aortic (valve) stenosis: Secondary | ICD-10-CM | POA: Diagnosis not present

## 2022-04-25 DIAGNOSIS — D509 Iron deficiency anemia, unspecified: Secondary | ICD-10-CM | POA: Diagnosis not present

## 2022-04-25 DIAGNOSIS — E785 Hyperlipidemia, unspecified: Secondary | ICD-10-CM | POA: Diagnosis not present

## 2022-04-25 DIAGNOSIS — I252 Old myocardial infarction: Secondary | ICD-10-CM | POA: Diagnosis not present

## 2022-04-25 DIAGNOSIS — J449 Chronic obstructive pulmonary disease, unspecified: Secondary | ICD-10-CM | POA: Diagnosis not present

## 2022-04-25 DIAGNOSIS — K222 Esophageal obstruction: Secondary | ICD-10-CM | POA: Diagnosis not present

## 2022-04-25 DIAGNOSIS — I5023 Acute on chronic systolic (congestive) heart failure: Secondary | ICD-10-CM | POA: Diagnosis not present

## 2022-04-25 DIAGNOSIS — H409 Unspecified glaucoma: Secondary | ICD-10-CM | POA: Diagnosis not present

## 2022-04-25 DIAGNOSIS — K219 Gastro-esophageal reflux disease without esophagitis: Secondary | ICD-10-CM | POA: Diagnosis not present

## 2022-04-25 DIAGNOSIS — I480 Paroxysmal atrial fibrillation: Secondary | ICD-10-CM | POA: Diagnosis not present

## 2022-04-25 DIAGNOSIS — Z9181 History of falling: Secondary | ICD-10-CM | POA: Diagnosis not present

## 2022-04-25 DIAGNOSIS — N1832 Chronic kidney disease, stage 3b: Secondary | ICD-10-CM | POA: Diagnosis not present

## 2022-04-25 DIAGNOSIS — M1990 Unspecified osteoarthritis, unspecified site: Secondary | ICD-10-CM | POA: Diagnosis not present

## 2022-04-28 ENCOUNTER — Telehealth: Payer: Self-pay

## 2022-04-28 NOTE — Telephone Encounter (Signed)
Mark from [facility] called and verbal was given for Physical Therapy for 1 wk 1 and 2 wk 2.

## 2022-04-29 ENCOUNTER — Telehealth: Payer: Self-pay

## 2022-04-29 ENCOUNTER — Ambulatory Visit: Payer: Medicare HMO | Attending: Cardiology

## 2022-04-29 ENCOUNTER — Other Ambulatory Visit: Payer: Self-pay | Admitting: Nurse Practitioner

## 2022-04-29 DIAGNOSIS — N1832 Chronic kidney disease, stage 3b: Secondary | ICD-10-CM | POA: Diagnosis not present

## 2022-04-29 DIAGNOSIS — I5023 Acute on chronic systolic (congestive) heart failure: Secondary | ICD-10-CM | POA: Diagnosis not present

## 2022-04-29 DIAGNOSIS — Z9181 History of falling: Secondary | ICD-10-CM | POA: Diagnosis not present

## 2022-04-29 DIAGNOSIS — Z952 Presence of prosthetic heart valve: Secondary | ICD-10-CM | POA: Diagnosis not present

## 2022-04-29 DIAGNOSIS — M1990 Unspecified osteoarthritis, unspecified site: Secondary | ICD-10-CM | POA: Diagnosis not present

## 2022-04-29 DIAGNOSIS — I2510 Atherosclerotic heart disease of native coronary artery without angina pectoris: Secondary | ICD-10-CM | POA: Diagnosis not present

## 2022-04-29 DIAGNOSIS — I480 Paroxysmal atrial fibrillation: Secondary | ICD-10-CM | POA: Diagnosis not present

## 2022-04-29 DIAGNOSIS — Z7901 Long term (current) use of anticoagulants: Secondary | ICD-10-CM

## 2022-04-29 DIAGNOSIS — D509 Iron deficiency anemia, unspecified: Secondary | ICD-10-CM | POA: Diagnosis not present

## 2022-04-29 DIAGNOSIS — H409 Unspecified glaucoma: Secondary | ICD-10-CM | POA: Diagnosis not present

## 2022-04-29 DIAGNOSIS — Z951 Presence of aortocoronary bypass graft: Secondary | ICD-10-CM | POA: Diagnosis not present

## 2022-04-29 DIAGNOSIS — E785 Hyperlipidemia, unspecified: Secondary | ICD-10-CM | POA: Diagnosis not present

## 2022-04-29 DIAGNOSIS — J449 Chronic obstructive pulmonary disease, unspecified: Secondary | ICD-10-CM | POA: Diagnosis not present

## 2022-04-29 DIAGNOSIS — I252 Old myocardial infarction: Secondary | ICD-10-CM | POA: Diagnosis not present

## 2022-04-29 DIAGNOSIS — K219 Gastro-esophageal reflux disease without esophagitis: Secondary | ICD-10-CM | POA: Diagnosis not present

## 2022-04-29 DIAGNOSIS — N3000 Acute cystitis without hematuria: Secondary | ICD-10-CM

## 2022-04-29 DIAGNOSIS — Z7982 Long term (current) use of aspirin: Secondary | ICD-10-CM | POA: Diagnosis not present

## 2022-04-29 DIAGNOSIS — Z87891 Personal history of nicotine dependence: Secondary | ICD-10-CM | POA: Diagnosis not present

## 2022-04-29 DIAGNOSIS — K222 Esophageal obstruction: Secondary | ICD-10-CM | POA: Diagnosis not present

## 2022-04-29 DIAGNOSIS — I350 Nonrheumatic aortic (valve) stenosis: Secondary | ICD-10-CM | POA: Diagnosis not present

## 2022-04-29 DIAGNOSIS — Z954 Presence of other heart-valve replacement: Secondary | ICD-10-CM | POA: Diagnosis not present

## 2022-04-29 DIAGNOSIS — R1319 Other dysphagia: Secondary | ICD-10-CM | POA: Diagnosis not present

## 2022-04-29 DIAGNOSIS — N390 Urinary tract infection, site not specified: Secondary | ICD-10-CM | POA: Diagnosis not present

## 2022-04-29 LAB — POCT INR: INR: 1.7 — AB (ref 2.0–3.0)

## 2022-04-29 MED ORDER — NITROFURANTOIN MONOHYD MACRO 100 MG PO CAPS: 100.0000 mg | ORAL_CAPSULE | Freq: Two times a day (BID) | ORAL | 0 refills | Status: DC

## 2022-04-29 NOTE — Telephone Encounter (Signed)
Pt scheduled for ESOPHAGOGASTRODUODENOSCOPY (EGD) with possible dilation on 06/19/22. On Warfarin and needs cardiac clearance.

## 2022-04-29 NOTE — Telephone Encounter (Signed)
Returned call to [place] after speaking with Hermenia Bers advising we do not do urine samples. RN verbalized understanding.

## 2022-04-29 NOTE — Telephone Encounter (Signed)
[name] from [facility] called to report that Keith Jenkins is experiencing burning and discomfort with urination.  They are requesting an order for UA and urine culture.  He also is somewhat confused.  Flonnie Hailstone, NP gave the verbal order and is going to go ahead and treat with an antibiotic.

## 2022-04-29 NOTE — Patient Instructions (Signed)
Description   START taking 3 tablets daily.  Recheck INR 1 week.  [facility] [phone] Fax Clearance form to [phone]

## 2022-04-29 NOTE — Telephone Encounter (Signed)
   Pre-operative Risk Assessment    Patient Name: Keith Jenkins  DOB: [birth year] MRN: [id number]      Request for Surgical Clearance    Procedure:   EGD WITH POSSIBLE DILATION  Date of Surgery:  Clearance 06/19/22                                 Surgeon:  DR. Zenovia Jarred Surgeon's Group or Practice Name:  [facility] Phone number:  [phone] Fax number:  [phone]   Type of Clearance Requested:   - Medical  - Pharmacy:  Hold Warfarin (Coumadin)     Type of Anesthesia:  MAC   Additional requests/questions:    Jiles Prows   04/29/2022, 1:36 PM

## 2022-04-29 NOTE — Telephone Encounter (Signed)
[name] the home health nurse of the patient is calling to see if a urine sample can be taken at his coumadin appt today due to the patient showing symptoms of a UTI. Please advise.

## 2022-04-30 ENCOUNTER — Other Ambulatory Visit: Payer: Self-pay | Admitting: Legal Medicine

## 2022-04-30 NOTE — Telephone Encounter (Signed)
Patient with diagnosis of bioprosthetic MVR > 3 months ago and afib on warfarin for anticoagulation.    Procedure: EGD with possible dilation Date of procedure: 06/19/22  CHA2DS2-VASc Score = 3  This indicates a 3.2% annual risk of stroke. The patient's score is based upon: CHF History: 1 HTN History: 0 Diabetes History: 0 Stroke History: 0 Vascular Disease History: 1 Age Score: 1 Gender Score: 0   CrCl 77mL/min Platelet count 198K  Per office protocol, patient can hold warfarin for 5 days prior to procedure. Patient will not need bridging with Lovenox (enoxaparin) around procedure.  **This guidance is not considered finalized until pre-operative APP has relayed final recommendations.**

## 2022-05-05 ENCOUNTER — Other Ambulatory Visit: Payer: Self-pay

## 2022-05-05 DIAGNOSIS — G2581 Restless legs syndrome: Secondary | ICD-10-CM

## 2022-05-05 DIAGNOSIS — J680 Bronchitis and pneumonitis due to chemicals, gases, fumes and vapors: Secondary | ICD-10-CM

## 2022-05-05 DIAGNOSIS — F320 Major depressive disorder, single episode, mild: Secondary | ICD-10-CM

## 2022-05-05 MED ORDER — GABAPENTIN 600 MG PO TABS: 600.0000 mg | ORAL_TABLET | Freq: Every day | ORAL | 1 refills | Status: DC

## 2022-05-05 MED ORDER — ALBUTEROL SULFATE (2.5 MG/3ML) 0.083% IN NEBU: 3.0000 mL | INHALATION_SOLUTION | Freq: Four times a day (QID) | RESPIRATORY_TRACT | 12 refills | Status: DC | PRN

## 2022-05-05 MED ORDER — ESCITALOPRAM OXALATE 10 MG PO TABS: 10.0000 mg | ORAL_TABLET | Freq: Every day | ORAL | 1 refills | Status: DC

## 2022-05-05 MED ORDER — ALBUTEROL SULFATE HFA 108 (90 BASE) MCG/ACT IN AERS: 2.0000 | INHALATION_SPRAY | Freq: Four times a day (QID) | RESPIRATORY_TRACT | 0 refills | Status: DC | PRN

## 2022-05-05 NOTE — Telephone Encounter (Signed)
   Name: Keith Jenkins  DOB: 1945-07-29  MRN: [id number]   Primary Cardiologist: Arvilla Meres, MD  Chart reviewed as part of pre-operative protocol coverage.  [name] was last seen on 03/31/22 by Prince Rome NP following a hospitalization. During that visit, Dr. Gala Romney agreed he may proceed with EGD with dilation as planned.   Per our pharmD: Patient with diagnosis of bioprosthetic MVR > 3 months ago and afib on warfarin for anticoagulation.     Procedure: EGD with possible dilation Date of procedure: 06/19/22   CHA2DS2-VASc Score = 3  This indicates a 3.2% annual risk of stroke. The patient's score is based upon: CHF History: 1 HTN History: 0 Diabetes History: 0 Stroke History: 0 Vascular Disease History: 1 Age Score: 1 Gender Score: 0   CrCl 30mL/min Platelet count 198K   Per office protocol, patient can hold warfarin for 5 days prior to procedure. Patient will not need bridging with Lovenox (enoxaparin) around procedure.  Therefore, based on ACC/AHA guidelines, the patient would be at acceptable risk for the planned procedure without further cardiovascular testing.   I will route this recommendation to the requesting party via Epic fax function and remove from pre-op pool. Please call with questions.   Roe Rutherford Ramie Erman, PA 05/05/2022, 12:48 PM

## 2022-05-06 ENCOUNTER — Telehealth: Payer: Self-pay

## 2022-05-06 ENCOUNTER — Ambulatory Visit: Payer: Medicare HMO | Attending: Cardiology

## 2022-05-06 DIAGNOSIS — Z952 Presence of prosthetic heart valve: Secondary | ICD-10-CM

## 2022-05-06 DIAGNOSIS — Z7901 Long term (current) use of anticoagulants: Secondary | ICD-10-CM | POA: Diagnosis not present

## 2022-05-06 LAB — POCT INR
INR: 1.9 — AB (ref 2.0–3.0)
INR: 1.9 — AB (ref 2.0–3.0)

## 2022-05-06 NOTE — Patient Instructions (Addendum)
Description   Take 3.5 tablets today and then continue taking 3 tablets daily.  Recheck INR in 2 weeks.  Coumadin Clinic [phone] Fax Clearance form to [phone]

## 2022-05-06 NOTE — Progress Notes (Addendum)
Chronic Care Management Pharmacy Assistant   Name: Keith Jenkins  MRN: [id number] DOB: 08-Sep-1945  Reason for Encounter: [facility]   Albuterol inhaler PRN filled by PCP. Last filled 04-14-2022 25 DS. Next fill 05-14-2022 Amiodarone 200 mg Filled by specialist 04-09-2022 90 DS. Next fill 07-08-2022 Atorvastatin 80 mg filled by specialist on 04-30-2022 30 DS. Next fill 05-30-2022 Digoxin 0.125 mg filled by specialist on 04-30-2022 30 DS. Next fill 05-30-2022 Lexapro 10 mg filled by PCP 04-09-2022. Next fill 05-09-2022 Zetia 10 mg filled by specialist 04-09-2022. Next fill 05-09-2022 Albuterol nebulizer PRN filled by PCP on 03-28-2022 Gabapentin 600 mg filled by PCP on 04-14-2022 90 DS. Next fill 07-13-2021 Midodrine 10 mg filled by specialist on 04-30-2022 30 DS next fill 05-30-2022. Copay $99.85. Cash price $40. Potassium Chloride 20 meq filled by specialist 05-03-2022 15 DS. Next fill 05-16-2022 Spironolactone 25 mg filled by specialist 03-18-2022 60 DS. Next fill 05-16-2022 Torsemide 20 mg filled specialist 04-01-2022 20 DS. Next fill 05-16-2022 Trazodone 50 mg filled by PCP 04-30-2022. Next fill 05-30-2022 Warfarin 1 mg filled by specialist 04-22-2022 30 DS. Next fill 05-22-2022  05-05-2022: contacted Dr. Gala Romney and left a VM with request of transfer  05-08-2022: Shantell called back from Dr. Traci Sermon office and stated she will send medications over. Medications: Outpatient Encounter Medications as of 05/06/2022  Medication Sig   albuterol (PROVENTIL) (2.5 MG/3ML) 0.083% nebulizer solution Inhale 3 mLs into the lungs every 6 (six) hours as needed for wheezing or shortness of breath.   albuterol (VENTOLIN HFA) 108 (90 Base) MCG/ACT inhaler Inhale 2 puffs into the lungs every 6 (six) hours as needed for wheezing or shortness of breath.   amiodarone (PACERONE) 200 MG tablet Take 0.5 tablets (100 mg total) by mouth daily.   aspirin EC 81 MG tablet Take 1 tablet (81 mg  total) by mouth daily. Swallow whole.   atorvastatin (LIPITOR) 80 MG tablet Take 1 tablet (80 mg total) by mouth daily.   dapagliflozin propanediol (FARXIGA) 10 MG TABS tablet Take 1 tablet (10 mg total) by mouth daily.   digoxin (LANOXIN) 0.125 MG tablet Take 0.5 tablets (0.0625 mg total) by mouth daily.   escitalopram (LEXAPRO) 10 MG tablet Take 1 tablet (10 mg total) by mouth daily.   esomeprazole (NEXIUM) 40 MG capsule Take 40 mg by mouth daily.   ezetimibe (ZETIA) 10 MG tablet Take 0.5 tablets (5 mg total) by mouth daily.   Fluticasone-Umeclidin-Vilant (TRELEGY ELLIPTA) 100-62.5-25 MCG/ACT AEPB Inhale 1 puff into the lungs daily.   gabapentin (NEURONTIN) 600 MG tablet Take 1 tablet (600 mg total) by mouth at bedtime.   latanoprost (XALATAN) 0.005 % ophthalmic solution Place 1 drop into both eyes at bedtime.   midodrine (PROAMATINE) 10 MG tablet Take 1 tablet (10 mg total) by mouth 3 (three) times daily.   nitrofurantoin, macrocrystal-monohydrate, (MACROBID) 100 MG capsule Take 1 capsule (100 mg total) by mouth 2 (two) times daily.   nitroGLYCERIN (NITROSTAT) 0.4 MG SL tablet Place 1 tablet (0.4 mg total) under the tongue every 5 (five) minutes as needed for chest pain.   OVER THE COUNTER MEDICATION Apply 1 application  topically at bedtime. Medication: Mag Maxx Cream Back and restless leg   potassium chloride SA (KLOR-CON M) 20 MEQ tablet Take 2 tablets (40 mEq total) by mouth daily.   spironolactone (ALDACTONE) 25 MG tablet Take 1 tablet (25 mg total) by mouth daily.   torsemide (DEMADEX) 20 MG tablet Take 3 tablets (60 mg  total) by mouth daily.   traZODone (DESYREL) 50 MG tablet TAKE ONE TABLET BY MOUTH AT BEDTIME   warfarin (COUMADIN) 1 MG tablet Take 2 tablets to 3 tablets by mouth daily as directed by Coumadin Clinic   No facility-administered encounter medications on file as of 05/06/2022.   Huey Romans [facility] Clinical Pharmacist Assistant [phone]

## 2022-05-08 ENCOUNTER — Telehealth: Payer: Self-pay

## 2022-05-08 ENCOUNTER — Other Ambulatory Visit (HOSPITAL_COMMUNITY): Payer: Self-pay | Admitting: Cardiology

## 2022-05-08 DIAGNOSIS — E785 Hyperlipidemia, unspecified: Secondary | ICD-10-CM | POA: Diagnosis not present

## 2022-05-08 DIAGNOSIS — R1319 Other dysphagia: Secondary | ICD-10-CM | POA: Diagnosis not present

## 2022-05-08 DIAGNOSIS — I2510 Atherosclerotic heart disease of native coronary artery without angina pectoris: Secondary | ICD-10-CM | POA: Diagnosis not present

## 2022-05-08 DIAGNOSIS — Z7982 Long term (current) use of aspirin: Secondary | ICD-10-CM | POA: Diagnosis not present

## 2022-05-08 DIAGNOSIS — I5023 Acute on chronic systolic (congestive) heart failure: Secondary | ICD-10-CM | POA: Diagnosis not present

## 2022-05-08 DIAGNOSIS — K222 Esophageal obstruction: Secondary | ICD-10-CM | POA: Diagnosis not present

## 2022-05-08 DIAGNOSIS — I5043 Acute on chronic combined systolic (congestive) and diastolic (congestive) heart failure: Secondary | ICD-10-CM

## 2022-05-08 DIAGNOSIS — Z9181 History of falling: Secondary | ICD-10-CM | POA: Diagnosis not present

## 2022-05-08 DIAGNOSIS — I252 Old myocardial infarction: Secondary | ICD-10-CM | POA: Diagnosis not present

## 2022-05-08 DIAGNOSIS — N1832 Chronic kidney disease, stage 3b: Secondary | ICD-10-CM | POA: Diagnosis not present

## 2022-05-08 DIAGNOSIS — I10 Essential (primary) hypertension: Secondary | ICD-10-CM

## 2022-05-08 DIAGNOSIS — Z7901 Long term (current) use of anticoagulants: Secondary | ICD-10-CM | POA: Diagnosis not present

## 2022-05-08 DIAGNOSIS — Z951 Presence of aortocoronary bypass graft: Secondary | ICD-10-CM | POA: Diagnosis not present

## 2022-05-08 DIAGNOSIS — Z87891 Personal history of nicotine dependence: Secondary | ICD-10-CM | POA: Diagnosis not present

## 2022-05-08 DIAGNOSIS — I350 Nonrheumatic aortic (valve) stenosis: Secondary | ICD-10-CM | POA: Diagnosis not present

## 2022-05-08 DIAGNOSIS — M1990 Unspecified osteoarthritis, unspecified site: Secondary | ICD-10-CM | POA: Diagnosis not present

## 2022-05-08 DIAGNOSIS — J449 Chronic obstructive pulmonary disease, unspecified: Secondary | ICD-10-CM | POA: Diagnosis not present

## 2022-05-08 DIAGNOSIS — H409 Unspecified glaucoma: Secondary | ICD-10-CM | POA: Diagnosis not present

## 2022-05-08 DIAGNOSIS — D509 Iron deficiency anemia, unspecified: Secondary | ICD-10-CM | POA: Diagnosis not present

## 2022-05-08 DIAGNOSIS — K219 Gastro-esophageal reflux disease without esophagitis: Secondary | ICD-10-CM | POA: Diagnosis not present

## 2022-05-08 DIAGNOSIS — I5022 Chronic systolic (congestive) heart failure: Secondary | ICD-10-CM

## 2022-05-08 DIAGNOSIS — Z954 Presence of other heart-valve replacement: Secondary | ICD-10-CM | POA: Diagnosis not present

## 2022-05-08 DIAGNOSIS — I480 Paroxysmal atrial fibrillation: Secondary | ICD-10-CM | POA: Diagnosis not present

## 2022-05-08 DIAGNOSIS — E782 Mixed hyperlipidemia: Secondary | ICD-10-CM

## 2022-05-08 MED ORDER — DAPAGLIFLOZIN PROPANEDIOL 10 MG PO TABS: 10.0000 mg | ORAL_TABLET | Freq: Every day | ORAL | 1 refills | Status: DC

## 2022-05-08 MED ORDER — ASPIRIN 81 MG PO TBEC: 81.0000 mg | DELAYED_RELEASE_TABLET | Freq: Every day | ORAL | 1 refills | Status: DC

## 2022-05-08 MED ORDER — ATORVASTATIN CALCIUM 80 MG PO TABS: 80.0000 mg | ORAL_TABLET | Freq: Every day | ORAL | 3 refills | Status: DC

## 2022-05-08 MED ORDER — POTASSIUM CHLORIDE CRYS ER 20 MEQ PO TBCR: 40.0000 meq | EXTENDED_RELEASE_TABLET | Freq: Every day | ORAL | 6 refills | Status: DC

## 2022-05-08 MED ORDER — EZETIMIBE 10 MG PO TABS: 5.0000 mg | ORAL_TABLET | Freq: Every day | ORAL | 3 refills | Status: DC

## 2022-05-08 MED ORDER — AMIODARONE HCL 200 MG PO TABS: 100.0000 mg | ORAL_TABLET | Freq: Every day | ORAL | 1 refills | Status: DC

## 2022-05-08 MED ORDER — MIDODRINE HCL 10 MG PO TABS: 10.0000 mg | ORAL_TABLET | Freq: Three times a day (TID) | ORAL | 6 refills | Status: DC

## 2022-05-08 MED ORDER — SPIRONOLACTONE 25 MG PO TABS: 25.0000 mg | ORAL_TABLET | Freq: Every day | ORAL | 3 refills | Status: DC

## 2022-05-08 MED ORDER — DIGOXIN 125 MCG PO TABS: 0.0625 mg | ORAL_TABLET | Freq: Every day | ORAL | 3 refills | Status: DC

## 2022-05-08 MED ORDER — TORSEMIDE 20 MG PO TABS: 60.0000 mg | ORAL_TABLET | Freq: Every day | ORAL | 3 refills | Status: DC

## 2022-05-08 NOTE — Telephone Encounter (Signed)
[facility] family practice representative called to request all cardiac meds be [facility] as patient is changing pharmacies

## 2022-05-08 NOTE — Telephone Encounter (Signed)
Keith Jenkins, home nurse from [facility] sent urine to [facility] to check UA and urine culture last week. Have you received these results? Please advice.

## 2022-05-09 DIAGNOSIS — H409 Unspecified glaucoma: Secondary | ICD-10-CM | POA: Diagnosis not present

## 2022-05-09 DIAGNOSIS — Z7982 Long term (current) use of aspirin: Secondary | ICD-10-CM | POA: Diagnosis not present

## 2022-05-09 DIAGNOSIS — I252 Old myocardial infarction: Secondary | ICD-10-CM | POA: Diagnosis not present

## 2022-05-09 DIAGNOSIS — N1832 Chronic kidney disease, stage 3b: Secondary | ICD-10-CM | POA: Diagnosis not present

## 2022-05-09 DIAGNOSIS — J449 Chronic obstructive pulmonary disease, unspecified: Secondary | ICD-10-CM | POA: Diagnosis not present

## 2022-05-09 DIAGNOSIS — M1990 Unspecified osteoarthritis, unspecified site: Secondary | ICD-10-CM | POA: Diagnosis not present

## 2022-05-09 DIAGNOSIS — I2510 Atherosclerotic heart disease of native coronary artery without angina pectoris: Secondary | ICD-10-CM | POA: Diagnosis not present

## 2022-05-09 DIAGNOSIS — I5023 Acute on chronic systolic (congestive) heart failure: Secondary | ICD-10-CM | POA: Diagnosis not present

## 2022-05-09 DIAGNOSIS — Z954 Presence of other heart-valve replacement: Secondary | ICD-10-CM | POA: Diagnosis not present

## 2022-05-09 DIAGNOSIS — D509 Iron deficiency anemia, unspecified: Secondary | ICD-10-CM | POA: Diagnosis not present

## 2022-05-09 DIAGNOSIS — E785 Hyperlipidemia, unspecified: Secondary | ICD-10-CM | POA: Diagnosis not present

## 2022-05-09 DIAGNOSIS — Z951 Presence of aortocoronary bypass graft: Secondary | ICD-10-CM | POA: Diagnosis not present

## 2022-05-09 DIAGNOSIS — I480 Paroxysmal atrial fibrillation: Secondary | ICD-10-CM | POA: Diagnosis not present

## 2022-05-09 DIAGNOSIS — Z7901 Long term (current) use of anticoagulants: Secondary | ICD-10-CM | POA: Diagnosis not present

## 2022-05-09 DIAGNOSIS — I350 Nonrheumatic aortic (valve) stenosis: Secondary | ICD-10-CM | POA: Diagnosis not present

## 2022-05-09 DIAGNOSIS — K222 Esophageal obstruction: Secondary | ICD-10-CM | POA: Diagnosis not present

## 2022-05-09 DIAGNOSIS — Z9181 History of falling: Secondary | ICD-10-CM | POA: Diagnosis not present

## 2022-05-09 DIAGNOSIS — K219 Gastro-esophageal reflux disease without esophagitis: Secondary | ICD-10-CM | POA: Diagnosis not present

## 2022-05-09 DIAGNOSIS — Z87891 Personal history of nicotine dependence: Secondary | ICD-10-CM | POA: Diagnosis not present

## 2022-05-09 DIAGNOSIS — R1319 Other dysphagia: Secondary | ICD-10-CM | POA: Diagnosis not present

## 2022-05-12 ENCOUNTER — Ambulatory Visit: Payer: Medicare HMO | Admitting: Nurse Practitioner

## 2022-05-13 ENCOUNTER — Telehealth: Payer: Self-pay

## 2022-05-13 DIAGNOSIS — I252 Old myocardial infarction: Secondary | ICD-10-CM | POA: Diagnosis not present

## 2022-05-13 DIAGNOSIS — M1990 Unspecified osteoarthritis, unspecified site: Secondary | ICD-10-CM | POA: Diagnosis not present

## 2022-05-13 DIAGNOSIS — J449 Chronic obstructive pulmonary disease, unspecified: Secondary | ICD-10-CM | POA: Diagnosis not present

## 2022-05-13 DIAGNOSIS — D509 Iron deficiency anemia, unspecified: Secondary | ICD-10-CM | POA: Diagnosis not present

## 2022-05-13 DIAGNOSIS — Z7982 Long term (current) use of aspirin: Secondary | ICD-10-CM | POA: Diagnosis not present

## 2022-05-13 DIAGNOSIS — I350 Nonrheumatic aortic (valve) stenosis: Secondary | ICD-10-CM | POA: Diagnosis not present

## 2022-05-13 DIAGNOSIS — I2510 Atherosclerotic heart disease of native coronary artery without angina pectoris: Secondary | ICD-10-CM | POA: Diagnosis not present

## 2022-05-13 DIAGNOSIS — Z9181 History of falling: Secondary | ICD-10-CM | POA: Diagnosis not present

## 2022-05-13 DIAGNOSIS — K219 Gastro-esophageal reflux disease without esophagitis: Secondary | ICD-10-CM | POA: Diagnosis not present

## 2022-05-13 DIAGNOSIS — R1319 Other dysphagia: Secondary | ICD-10-CM | POA: Diagnosis not present

## 2022-05-13 DIAGNOSIS — K222 Esophageal obstruction: Secondary | ICD-10-CM | POA: Diagnosis not present

## 2022-05-13 DIAGNOSIS — H409 Unspecified glaucoma: Secondary | ICD-10-CM | POA: Diagnosis not present

## 2022-05-13 DIAGNOSIS — N1832 Chronic kidney disease, stage 3b: Secondary | ICD-10-CM | POA: Diagnosis not present

## 2022-05-13 DIAGNOSIS — Z87891 Personal history of nicotine dependence: Secondary | ICD-10-CM | POA: Diagnosis not present

## 2022-05-13 DIAGNOSIS — I480 Paroxysmal atrial fibrillation: Secondary | ICD-10-CM | POA: Diagnosis not present

## 2022-05-13 DIAGNOSIS — E785 Hyperlipidemia, unspecified: Secondary | ICD-10-CM | POA: Diagnosis not present

## 2022-05-13 DIAGNOSIS — Z951 Presence of aortocoronary bypass graft: Secondary | ICD-10-CM | POA: Diagnosis not present

## 2022-05-13 DIAGNOSIS — I5023 Acute on chronic systolic (congestive) heart failure: Secondary | ICD-10-CM | POA: Diagnosis not present

## 2022-05-13 DIAGNOSIS — Z954 Presence of other heart-valve replacement: Secondary | ICD-10-CM | POA: Diagnosis not present

## 2022-05-13 DIAGNOSIS — Z7901 Long term (current) use of anticoagulants: Secondary | ICD-10-CM | POA: Diagnosis not present

## 2022-05-13 NOTE — Telephone Encounter (Signed)
Returned call. Clarified correct Warfarin dose. Pt and pt's wife verbalized understanding.
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
## 2022-05-13 NOTE — Telephone Encounter (Signed)
Pt wife calling because she has a question about how much coumadin pt should be taking and if pt needs an INR check or not

## 2022-05-14 ENCOUNTER — Encounter: Payer: Self-pay | Admitting: Nurse Practitioner

## 2022-05-14 ENCOUNTER — Ambulatory Visit (INDEPENDENT_AMBULATORY_CARE_PROVIDER_SITE_OTHER): Payer: Medicare HMO | Admitting: Nurse Practitioner

## 2022-05-14 ENCOUNTER — Other Ambulatory Visit: Payer: Self-pay | Admitting: Nurse Practitioner

## 2022-05-14 VITALS — BP 110/72 | HR 65 | Temp 97.6°F | Ht 74.0 in | Wt 195.0 lb

## 2022-05-14 DIAGNOSIS — F5101 Primary insomnia: Secondary | ICD-10-CM

## 2022-05-14 DIAGNOSIS — G2581 Restless legs syndrome: Secondary | ICD-10-CM

## 2022-05-14 DIAGNOSIS — R69 Illness, unspecified: Secondary | ICD-10-CM | POA: Diagnosis not present

## 2022-05-14 MED ORDER — TRELEGY ELLIPTA 100-62.5-25 MCG/ACT IN AEPB: 1.0000 | INHALATION_SPRAY | Freq: Every day | RESPIRATORY_TRACT | 11 refills | Status: DC

## 2022-05-14 MED ORDER — CYANOCOBALAMIN 1000 MCG/ML IJ SOLN: 1000.0000 ug | Freq: Once | INTRAMUSCULAR | 0 refills | Status: DC

## 2022-05-14 MED ORDER — ZOLPIDEM TARTRATE 5 MG PO TABS: 5.0000 mg | ORAL_TABLET | Freq: Every evening | ORAL | 1 refills | Status: DC | PRN

## 2022-05-14 MED ORDER — BELSOMRA 5 MG PO TABS: 5.0000 mg | ORAL_TABLET | Freq: Every evening | ORAL | 0 refills | Status: DC | PRN

## 2022-05-14 MED ORDER — CYANOCOBALAMIN 1000 MCG/ML IJ SOLN: 1000.0000 ug | Freq: Once | INTRAMUSCULAR | 0 refills | Status: AC

## 2022-05-14 NOTE — Patient Instructions (Addendum)
Stop Trazadone Begin Belsomra 5 mg as needed at night for insomnia Avoid caffeine and napping during the day Begin B12 injection weekly for 4 weeks We will call you lab results  Restless Legs Syndrome Restless legs syndrome is a condition that causes uncomfortable feelings or sensations in the legs, especially while sitting or lying down. The sensations usually cause an overwhelming urge to move the legs. The arms can also sometimes be affected. The condition can range from mild to severe. The symptoms often interfere with a person's ability to sleep. What are the causes? The cause of this condition is not known. What increases the risk? The following factors may make you more likely to develop this condition: Being older than 50. Pregnancy. Being a woman. In general, the condition is more common in women than in men. A family history of the condition. Having iron deficiency. Overuse of caffeine, nicotine, or alcohol. Certain medical conditions, such as kidney disease, Parkinson's disease, or nerve damage. Certain medicines, such as those for high blood pressure, nausea, colds, allergies, depression, and some heart conditions. What are the signs or symptoms? The main symptom of this condition is uncomfortable sensations in the legs, such as: Pulling. Tingling. Prickling. Throbbing. Crawling. Burning. Usually, the sensations: Affect both sides of the body. Are worse when you sit or lie down. Are worse at night. These may make it difficult to fall asleep. Make you have a strong urge to move your legs. Are temporarily relieved by moving your legs or standing. The arms can also be affected, but this is rare. People who have this condition often have tiredness during the day because of their lack of sleep at night. How is this diagnosed? This condition may be diagnosed based on: Your symptoms. Blood tests. In some cases, you may be monitored in a sleep lab by a specialist (a sleep  study). This can detect any disruptions in your sleep. How is this treated? This condition is treated by managing the symptoms. This may include: Lifestyle changes, such as exercising, using relaxation techniques, and avoiding caffeine, alcohol, or tobacco. Iron supplements. Medicines. Parkinson's medications may be tried first. Anti-seizure medications can also be helpful. Follow these instructions at home: General instructions Take over-the-counter and prescription medicines only as told by your health care provider. Use methods to help relieve the uncomfortable sensations, such as: Massaging your legs. Walking or stretching. Taking a cold or hot bath. Keep all follow-up visits. This is important. Lifestyle     Practice good sleep habits. For example, go to bed and get up at the same time every day. Most adults should get 7-9 hours of sleep each night. Exercise regularly. Try to get at least 30 minutes of exercise most days of the week. Practice ways of relaxing, such as yoga or meditation. Avoid caffeine and alcohol. Do not use any products that contain nicotine or tobacco. These products include cigarettes, chewing tobacco, and vaping devices, such as e-cigarettes. If you need help quitting, ask your health care provider. Where to find more information [employer] of Neurological Disorders and Stroke: [employer] Contact a health care provider if: Your symptoms get worse or they do not improve with treatment. Summary Restless legs syndrome is a condition that causes uncomfortable feelings or sensations in the legs, especially while sitting or lying down. The symptoms often interfere with your ability to sleep. This condition is treated by managing the symptoms. You may need to make lifestyle changes or take medicines. This information is not intended  to replace advice given to you by your health care provider. Make sure you discuss any questions you have with your  health care provider. Document Revised: 01/06/2021 Document Reviewed: 01/06/2021 Elsevier Patient Education  2023 Elsevier Inc.   Quality Sleep Information, Adult Quality sleep is important for your mental and physical health. It also improves your quality of life. Quality sleep means you: Are asleep for most of the time you are in bed. Fall asleep within 30 minutes. Wake up no more than once a night. Are awake for no longer than 20 minutes if you do wake up during the night. Most adults need 7-8 hours of quality sleep each night. How can poor sleep affect me? If you do not get enough quality sleep, you may have: Mood swings. Daytime sleepiness. Decreased alertness, reaction time, and concentration. Sleep disorders, such as insomnia and sleep apnea. Difficulty with: Solving problems. Coping with stress. Paying attention. These issues may affect your performance and productivity at work, school, and home. Lack of sleep may also put you at higher risk for accidents, suicide, and risky behaviors. If you do not get quality sleep, you may also be at higher risk for several health problems, including: Infections. Type 2 diabetes. Heart disease. High blood pressure. Obesity. Worsening of long-term conditions, like arthritis, kidney disease, depression, Parkinson's disease, and epilepsy. What actions can I take to get more quality sleep? Sleep schedule and routine Stick to a sleep schedule. Go to sleep and wake up at about the same time each day. Do not try to sleep less on weekdays and make up for lost sleep on weekends. This does not work. Limit naps during the day to 30 minutes or less. Do not take naps in the late afternoon. Make time to relax before bed. Reading, listening to music, or taking a hot bath promotes quality sleep. Make your bedroom a place that promotes quality sleep. Keep your bedroom dark, quiet, and at a comfortable room temperature. Make sure your bed is  comfortable. Avoid using electronic devices that give off bright blue light for 30 minutes before bedtime. Your brain perceives bright blue light as sunlight. This includes television, phones, and computers. If you are lying awake in bed for longer than 20 minutes, get up and do a relaxing activity until you feel sleepy. Lifestyle     Try to get at least 30 minutes of exercise on most days. Do not exercise 2-3 hours before going to bed. Do not use any products that contain nicotine or tobacco. These products include cigarettes, chewing tobacco, and vaping devices, such as e-cigarettes. If you need help quitting, ask your health care provider. Do not drink caffeinated beverages for at least 8 hours before going to bed. Coffee, tea, and some sodas contain caffeine. Do not drink alcohol or eat large meals close to bedtime. Try to get at least 30 minutes of sunlight every day. Morning sunlight is best. Medical concerns Work with your health care provider to treat medical conditions that may affect sleeping, such as: Nasal obstruction. Snoring. Sleep apnea and other sleep disorders. Talk to your health care provider if you think any of your prescription medicines may cause you to have difficulty falling or staying asleep. If you have sleep problems, talk with a sleep consultant. If you think you have a sleep disorder, talk with your health care provider about getting evaluated by a specialist. Where to find more information Sleep Foundation: sleepfoundation.org American Academy of Sleep Medicine: aasm.org Centers for Disease  Control and Prevention (CDC): [employer] Contact a health care provider if: You have trouble getting to sleep or staying asleep. You often wake up very early in the morning and cannot get back to sleep. You have daytime sleepiness. You have daytime sleep attacks of suddenly falling asleep and sudden muscle weakness (narcolepsy). You have a tingling sensation in your legs with  a strong urge to move your legs (restless legs syndrome). You stop breathing briefly during sleep (sleep apnea). You think you have a sleep disorder or are taking a medicine that is affecting your quality of sleep. Summary Most adults need 7-8 hours of quality sleep each night. Getting enough quality sleep is important for your mental and physical health. Make your bedroom a place that promotes quality sleep, and avoid things that may cause you to have poor sleep, such as alcohol, caffeine, smoking, or large meals. Talk to your health care provider if you have trouble falling asleep or staying asleep. This information is not intended to replace advice given to you by your health care provider. Make sure you discuss any questions you have with your health care provider. Document Revised: 09/18/2021 Document Reviewed: 09/18/2021 Elsevier Patient Education  2023 Elsevier Inc. Insomnia Insomnia is a sleep disorder that makes it difficult to fall asleep or stay asleep. Insomnia can cause fatigue, low energy, difficulty concentrating, mood swings, and poor performance at work or school. There are three different ways to classify insomnia: Difficulty falling asleep. Difficulty staying asleep. Waking up too early in the morning. Any type of insomnia can be long-term (chronic) or short-term (acute). Both are common. Short-term insomnia usually lasts for 3 months or less. Chronic insomnia occurs at least three times a week for longer than 3 months. What are the causes? Insomnia may be caused by another condition, situation, or substance, such as: Having certain mental health conditions, such as anxiety and depression. Using caffeine, alcohol, tobacco, or drugs. Having gastrointestinal conditions, such as gastroesophageal reflux disease (GERD). Having certain medical conditions. These include: Asthma. Alzheimer's disease. Stroke. Chronic pain. An overactive thyroid gland (hyperthyroidism). Other  sleep disorders, such as restless legs syndrome and sleep apnea. Menopause. Sometimes, the cause of insomnia may not be known. What increases the risk? Risk factors for insomnia include: Gender. Females are affected more often than males. Age. Insomnia is more common as people get older. Stress and certain medical and mental health conditions. Lack of exercise. Having an irregular work schedule. This may include working night shifts and traveling between different time zones. What are the signs or symptoms? If you have insomnia, the main symptom is having trouble falling asleep or having trouble staying asleep. This may lead to other symptoms, such as: Feeling tired or having low energy. Feeling nervous about going to sleep. Not feeling rested in the morning. Having trouble concentrating. Feeling irritable, anxious, or depressed. How is this diagnosed? This condition may be diagnosed based on: Your symptoms and medical history. Your health care provider may ask about: Your sleep habits. Any medical conditions you have. Your mental health. A physical exam. How is this treated? Treatment for insomnia depends on the cause. Treatment may focus on treating an underlying condition that is causing the insomnia. Treatment may also include: Medicines to help you sleep. Counseling or therapy. Lifestyle adjustments to help you sleep better. Follow these instructions at home: Eating and drinking  Limit or avoid alcohol, caffeinated beverages, and products that contain nicotine and tobacco, especially close to bedtime. These can disrupt your  sleep. Do not eat a large meal or eat spicy foods right before bedtime. This can lead to digestive discomfort that can make it hard for you to sleep. Sleep habits  Keep a sleep diary to help you and your health care provider figure out what could be causing your insomnia. Write down: When you sleep. When you wake up during the night. How well you sleep  and how rested you feel the next day. Any side effects of medicines you are taking. What you eat and drink. Make your bedroom a dark, comfortable place where it is easy to fall asleep. Put up shades or blackout curtains to block light from outside. Use a white noise machine to block noise. Keep the temperature cool. Limit screen use before bedtime. This includes: Not watching TV. Not using your smartphone, tablet, or computer. Stick to a routine that includes going to bed and waking up at the same times every day and night. This can help you fall asleep faster. Consider making a quiet activity, such as reading, part of your nighttime routine. Try to avoid taking naps during the day so that you sleep better at night. Get out of bed if you are still awake after 15 minutes of trying to sleep. Keep the lights down, but try reading or doing a quiet activity. When you feel sleepy, go back to bed. General instructions Take over-the-counter and prescription medicines only as told by your health care provider. Exercise regularly as told by your health care provider. However, avoid exercising in the hours right before bedtime. Use relaxation techniques to manage stress. Ask your health care provider to suggest some techniques that may work well for you. These may include: Breathing exercises. Routines to release muscle tension. Visualizing peaceful scenes. Make sure that you drive carefully. Do not drive if you feel very sleepy. Keep all follow-up visits. This is important. Contact a health care provider if: You are tired throughout the day. You have trouble in your daily routine due to sleepiness. You continue to have sleep problems, or your sleep problems get worse. Get help right away if: You have thoughts about hurting yourself or someone else. Get help right away if you feel like you may hurt yourself or others, or have thoughts about taking your own life. Go to your nearest emergency room  or: Call 911. Call the National Suicide Prevention Lifeline at (518)467-5090 or 988. This is open 24 hours a day. Text the Crisis Text Line at (262)628-7632. Summary Insomnia is a sleep disorder that makes it difficult to fall asleep or stay asleep. Insomnia can be long-term (chronic) or short-term (acute). Treatment for insomnia depends on the cause. Treatment may focus on treating an underlying condition that is causing the insomnia. Keep a sleep diary to help you and your health care provider figure out what could be causing your insomnia. This information is not intended to replace advice given to you by your health care provider. Make sure you discuss any questions you have with your health care provider. Document Revised: 05/06/2021 Document Reviewed: 05/06/2021 Elsevier Patient Education  2023 [employer].

## 2022-05-15 ENCOUNTER — Other Ambulatory Visit (INDEPENDENT_AMBULATORY_CARE_PROVIDER_SITE_OTHER): Payer: Medicare HMO

## 2022-05-15 ENCOUNTER — Other Ambulatory Visit: Payer: Self-pay

## 2022-05-15 ENCOUNTER — Other Ambulatory Visit: Payer: Self-pay | Admitting: Nurse Practitioner

## 2022-05-15 ENCOUNTER — Encounter (HOSPITAL_COMMUNITY): Payer: Self-pay

## 2022-05-15 ENCOUNTER — Telehealth: Payer: Self-pay

## 2022-05-15 ENCOUNTER — Telehealth (HOSPITAL_COMMUNITY): Payer: Self-pay

## 2022-05-15 ENCOUNTER — Encounter: Payer: Self-pay | Admitting: Nurse Practitioner

## 2022-05-15 ENCOUNTER — Ambulatory Visit (HOSPITAL_COMMUNITY)
Admission: RE | Admit: 2022-05-15 | Discharge: 2022-05-15 | Disposition: A | Discharge status: Home / Self Care | Payer: Medicare HMO | Source: Ambulatory Visit | Attending: Cardiology | Admitting: Cardiology

## 2022-05-15 VITALS — BP 108/68 | HR 64 | Wt 193.0 lb

## 2022-05-15 DIAGNOSIS — I2510 Atherosclerotic heart disease of native coronary artery without angina pectoris: Secondary | ICD-10-CM | POA: Diagnosis not present

## 2022-05-15 DIAGNOSIS — I252 Old myocardial infarction: Secondary | ICD-10-CM | POA: Insufficient documentation

## 2022-05-15 DIAGNOSIS — Z7901 Long term (current) use of anticoagulants: Secondary | ICD-10-CM | POA: Insufficient documentation

## 2022-05-15 DIAGNOSIS — J449 Chronic obstructive pulmonary disease, unspecified: Secondary | ICD-10-CM | POA: Insufficient documentation

## 2022-05-15 DIAGNOSIS — J90 Pleural effusion, not elsewhere classified: Secondary | ICD-10-CM | POA: Diagnosis not present

## 2022-05-15 DIAGNOSIS — Z953 Presence of xenogenic heart valve: Secondary | ICD-10-CM | POA: Insufficient documentation

## 2022-05-15 DIAGNOSIS — D509 Iron deficiency anemia, unspecified: Secondary | ICD-10-CM

## 2022-05-15 DIAGNOSIS — I5022 Chronic systolic (congestive) heart failure: Secondary | ICD-10-CM

## 2022-05-15 DIAGNOSIS — I480 Paroxysmal atrial fibrillation: Secondary | ICD-10-CM | POA: Insufficient documentation

## 2022-05-15 DIAGNOSIS — I080 Rheumatic disorders of both mitral and aortic valves: Secondary | ICD-10-CM | POA: Diagnosis not present

## 2022-05-15 DIAGNOSIS — E538 Deficiency of other specified B group vitamins: Secondary | ICD-10-CM

## 2022-05-15 DIAGNOSIS — Z951 Presence of aortocoronary bypass graft: Secondary | ICD-10-CM | POA: Insufficient documentation

## 2022-05-15 DIAGNOSIS — I309 Acute pericarditis, unspecified: Secondary | ICD-10-CM | POA: Insufficient documentation

## 2022-05-15 DIAGNOSIS — Z79899 Other long term (current) drug therapy: Secondary | ICD-10-CM | POA: Insufficient documentation

## 2022-05-15 DIAGNOSIS — K219 Gastro-esophageal reflux disease without esophagitis: Secondary | ICD-10-CM | POA: Diagnosis not present

## 2022-05-15 DIAGNOSIS — E785 Hyperlipidemia, unspecified: Secondary | ICD-10-CM | POA: Insufficient documentation

## 2022-05-15 LAB — BASIC METABOLIC PANEL
Anion gap: 9 (ref 5–15)
BUN: 48 mg/dL — ABNORMAL HIGH (ref 8–23)
CO2: 26 mmol/L (ref 22–32)
Calcium: 8.8 mg/dL — ABNORMAL LOW (ref 8.9–10.3)
Chloride: 101 mmol/L (ref 98–111)
Creatinine, Ser: 2.15 mg/dL — ABNORMAL HIGH (ref 0.61–1.24)
GFR, Estimated: 32 mL/min — ABNORMAL LOW (ref >60)
Glucose, Bld: 108 mg/dL — ABNORMAL HIGH (ref 70–99)
Potassium: 3.6 mmol/L (ref 3.5–5.1)
Sodium: 136 mmol/L (ref 135–145)

## 2022-05-15 LAB — BRAIN NATRIURETIC PEPTIDE: B Natriuretic Peptide: 1676.9 pg/mL — ABNORMAL HIGH (ref 0.0–100.0)

## 2022-05-15 LAB — B12 AND FOLATE PANEL
Folate: 10.6 ng/mL (ref >3.0)
Vitamin B-12: 812 pg/mL (ref 232–1245)

## 2022-05-15 LAB — IRON,TIBC AND FERRITIN PANEL
Ferritin: 64 ng/mL (ref 30–400)
Iron Saturation: 7 % — CL (ref 15–55)
Iron: 26 ug/dL — ABNORMAL LOW (ref 38–169)
Total Iron Binding Capacity: 378 ug/dL (ref 250–450)
UIBC: 352 ug/dL — ABNORMAL HIGH (ref 111–343)

## 2022-05-15 LAB — DIGOXIN LEVEL: Digoxin Level: 0.8 ng/mL (ref 0.8–2.0)

## 2022-05-15 MED ORDER — IRON (FERROUS SULFATE) 325 (65 FE) MG PO TABS: 325.0000 mg | ORAL_TABLET | Freq: Every day | ORAL | 1 refills | Status: DC

## 2022-05-15 MED ORDER — CYANOCOBALAMIN 1000 MCG/ML IJ SOLN
1000.0000 ug | Freq: Once | INTRAMUSCULAR | Status: AC
  Administered 2022-05-15: 1000 ug via INTRAMUSCULAR

## 2022-05-15 NOTE — Telephone Encounter (Signed)
Patients wife advised and verbalized understanding. She asked if we could send it via mychart so her daughter could read it. Patient has coumadin clinic appt next week, will draw labs at that appointment.  Orders Placed This Encounter  Procedures   Basic metabolic panel    Standing Status:   Future    Standing Expiration Date:   05/16/2023    Order Specific Question:   Release to patient    Answer:   Immediate    Order Specific Question:   Release to patient    Answer:   Immediate [1]   B Nat Peptide    Standing Status:   Future    Standing Expiration Date:   05/16/2023    Order Specific Question:   Release to patient    Answer:   Immediate [1]

## 2022-05-15 NOTE — Patient Instructions (Addendum)
Thank you for coming in today  Labs were done today, if any labs are abnormal the clinic will call you No news is good news     Do the following things EVERYDAY: Weigh yourself in the morning before breakfast. Write it down and keep it in a log. Take your medicines as prescribed Eat low salt foods--Limit salt (sodium) to 2000 mg per day.  Stay as active as you can everyday Limit all fluids for the day to less than 2 liters  At [facility], you and your health needs are our priority. As part of our continuing mission to provide you with exceptional heart care, we have created designated Provider Care Teams. These Care Teams include your primary Cardiologist (physician) and Advanced Practice Providers (APPs- Physician Assistants and Nurse Practitioners) who all work together to provide you with the care you need, when you need it.   You may see any of the following providers on your designated Care Team at your next follow up: Dr Arvilla Meres Dr Marca Ancona Dr. Marcos Eke, NP Robbie Lis, [place] [facility] [place], [place] Brynda Peon, NP Karle Plumber, PharmD   Please be sure to bring in all your medications bottles to every appointment.    If you have any questions or concerns before your next appointment please send [place] a message through [place] or call our office at [phone].    TO LEAVE A MESSAGE FOR THE NURSE SELECT OPTION 2, PLEASE LEAVE A MESSAGE INCLUDING: YOUR NAME DATE OF BIRTH CALL BACK NUMBER REASON FOR CALL**this is important as we prioritize the call backs  YOU WILL RECEIVE A CALL BACK THE SAME DAY AS LONG AS YOU CALL BEFORE 4:00 PM

## 2022-05-15 NOTE — Progress Notes (Signed)
ReDS Vest / Clip - 05/15/22 0954       ReDS Vest / Clip   Station Marker C    Ruler Value 30    ReDS Value Range High volume overload    ReDS Actual Value 46    Anatomical Comments sitting

## 2022-05-15 NOTE — Progress Notes (Signed)
ADVANCED HF CLINIC NOTE   Primary Care: [name], NP HF Cardiologist: Dr. Gala Romney   HPI: Keith Jenkins is a 76 y.o. male with history of COPD, prediabetes, GERD/dysphagia, HLD, and new diagnosis of CAD and systolic heart failure.  Admitted 4/23 with chest pain. Ruled in for inferior STEMI, late presenting. LHC showed total occlusion mRCA and 90% mLAD. RCA managed medically d/t completed infarct.  Plan to optimize medical therapy and arrange LAD intervention at later time. Echo showed EF 20-25%, RWMA, RV moderately reduced, moderate to severe ischemic MR, aortic root 4.3 cm, dilated IVC with estimated RAP 8 mmHg. AHF consulted and GDMT titrated, but limited due to soft blood pressure. CVTS consulted for CABG/MVR, instead of PCI to LAD, due to moderate to severe ischemic MR. Plan to optimize medically and improve strength, then arrange surgery a few weeks later. Discharged home, weight 217 lbs.   Post hospital follow up 5/23, mildly volume up, REDs 44%. Lasix 20 mg daily restarted. Plan to repeat echo in 1-2 weeks and coordinate timing of surgery.  Admitted 10/25/21 with a/c CHF. Stat echo showed EF 30-35%, RV moderately reduced, moderate pericardial effusion with no tamponade. Started on milrinone. Developed AFL and amiodarone gtt started. Underwent CABG x 2 (LIMA-LAD, SVG-RAMUS INT) +MVR, MAZE, LAA Clip. Hospitalization c/b distal esophageal stricture seen on barium swallow. GI consulted and plan for outpatient EGD/dilation. Diuresed with IV lasix. Drips weaned off, GDMT limited due to hypotension and need for midodrine. Discharged home, 202 lbs.  Seen in ED 02/12/22 with fall. Given IVF, CT head negative.   Follow up 9/23, continued to feel weak with LE swelling. He was unable to complete CR due to low BP. ReDs 53%, torsemide increased to 40 bid x 3 days then back to 40 daily. Arranged for repeat echo.  Seen in ED 02/20/22 for fall after becoming light-headed. CT head and neck negative  for acute abnormality.  Echo 03/12/22 EF 20-25%, severe LV dysfunction with global HK, + large Left pleural effusion, RV moderately down, moderate aortic stenosis, suspect component of low gradient AS, dimensionless index = 0.31. Reviewed by Dr. Gala Romney. Volume overloaded and NYHA IV at follow up 03/12/22.. Direct admit from clinic for IV diuresis and VAD work up. Diuresed with IV lasix. Underwent RHC showing elevated filling pressures with moderately decreased cardiac output, Fick CO/CI 5.7/2.7 Thermo CO/CI 4.7/2.2. CVTS and Dr. Gala Romney reviewed case and felt patient not a good candidate for VAD. Patient also declined VAD. Underwent bilateral thoracentesis before discharge. Discharged home, weight 172 lbs.  At last post hospital f/u visit, he was doing fairly well. NYHA II-early III. Volume status ok. No med changes.   He presents back today for 6wk f/u. Here w/ wife and daughter. Doing fairly well from HF standpoint, symptom wise. 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 No CP. No syncope/near syncope. He has had some fatigue but recently found by PCP to have IDA and Vit B deficiency. Just prescribed supplements yesterday. He denies gross bleeding. Has never had colonoscopy. Not interested in this. Also notes recent poor sleep but no orthopnea/PND.   BP today 108/68. Mildly fluid overloaded on exam. ReDs is elevated at 46%. Wt up 5 lb since last OV. BNP 1600.   Cardiac Studies  - RHC (10/23): RA = 8 RV = 45/10 PA = 48/16 (28) PCW = 22 (v = 30)  Fick cardiac output/index = 5.7/2.7 Thermo CO/CI = 4.7/2.2 PVR = 0.8  WU Ao sat = 94% PA sat = 54%, 57% PAPI = 4.0   - Echo (10/23): EF 20-25%, severe LV dysfunction with global HK, + large Left pleural effusion, RV moderately down, moderate aortic stenosis, suspect component of low gradient AS, dimensionless index = 0.31  - Echo (5/23): EF 25%, RV moderately reduced, moderate to severe MR  - Echo (4/23): EF 20-25%,  RWMA, RV moderately reduced, mioderate to severe ischemic MR, aortic root 4.3 cm, dilated IVC with estimated RAP 8 mmHg  - LHC (5/23): 100% m RCA and 90% m LAD. RCA managed medically d/t completed infarct.   ROS: All systems reviewed and negative except as per HPI.   Past Medical History:  Diagnosis Date   Aortic atherosclerosis (HCC)    Arthritis    CHF (congestive heart failure) (HCC)    Esophageal stricture    Glaucoma    MI (myocardial infarction) (HCC)    Osteoarthritis    Current Outpatient Medications  Medication Sig Dispense Refill   albuterol (PROVENTIL) (2.5 MG/3ML) 0.083% nebulizer solution Inhale 3 mLs into the lungs every 6 (six) hours as needed for wheezing or shortness of breath. 90 mL 12   amiodarone (PACERONE) 200 MG tablet Take 0.5 tablets (100 mg total) by mouth daily. 45 tablet 1   aspirin EC 81 MG tablet Take 1 tablet (81 mg total) by mouth daily. Swallow whole. 90 tablet 1   atorvastatin (LIPITOR) 80 MG tablet Take 1 tablet (80 mg total) by mouth daily. 30 tablet 3   dapagliflozin propanediol (FARXIGA) 10 MG TABS tablet Take 1 tablet (10 mg total) by mouth daily. 90 tablet 1   digoxin (LANOXIN) 0.125 MG tablet Take 0.5 tablets (0.0625 mg total) by mouth daily. 15 tablet 3   escitalopram (LEXAPRO) 10 MG tablet TAKE ONE TABLET BY MOUTH ONCE DAILY 90 tablet 1   esomeprazole (NEXIUM) 40 MG capsule Take 40 mg by mouth daily.     ezetimibe (ZETIA) 10 MG tablet Take 0.5 tablets (5 mg total) by mouth daily. 15 tablet 3   Fluticasone-Umeclidin-Vilant (TRELEGY ELLIPTA) 100-62.5-25 MCG/ACT AEPB Inhale 1 puff into the lungs daily. 1 each 2   latanoprost (XALATAN) 0.005 % ophthalmic solution Place 1 drop into both eyes at bedtime.     midodrine (PROAMATINE) 10 MG tablet Take 1 tablet (10 mg total) by mouth 3 (three) times daily. 90 tablet 6   nitroGLYCERIN (NITROSTAT) 0.4 MG SL tablet Place 1 tablet (0.4 mg total) under the tongue every 5 (five) minutes as needed for chest  pain. 25 tablet 3   OVER THE COUNTER MEDICATION Apply 1 application  topically daily as needed (For back pain). Medication: Mag Maxx Cream     potassium chloride SA (KLOR-CON M) 20 MEQ tablet Take 2 tablets (40 mEq total) by mouth daily. 30 tablet 6   spironolactone (ALDACTONE) 25 MG tablet Take 1 tablet (25 mg total) by mouth daily. 60 tablet 3   torsemide (DEMADEX) 20 MG tablet Take 3 tablets (60 mg total) by mouth daily. 60 tablet 3   traZODone (DESYREL) 50 MG tablet Take 1 tablet (50 mg total) by mouth at bedtime. 30 tablet 2   warfarin (COUMADIN) 1 MG tablet Take 2 tablets to 3 tablets by mouth daily as directed by [facility] 200 tablet 0   No current facility-administered medications for this encounter.   No Known Allergies  Social History   Socioeconomic History   Marital status: Married    Spouse name: Not on  file   Number of children: Not on file   Years of education: Not on file   Highest education level: Not on file  Occupational History   Not on file  Tobacco Use   Smoking status: Former    Types: Cigarettes    Quit date: 4    Years since quitting: 53.8   Smokeless tobacco: Never  Vaping Use   Vaping Use: Never used  Substance and Sexual Activity   Alcohol use: Not Currently   Drug use: Never   Sexual activity: Not Currently    Birth control/protection: None  Other Topics Concern   Not on file  Social History Narrative   Not on file   Social Determinants of Health   Financial Resource Strain: Not on file  Food Insecurity: No Food Insecurity (03/19/2022)   Hunger Vital Sign    Worried About Running Out of Food in the Last Year: Never true    Ran Out of Food in the Last Year: Never true  Transportation Needs: No Transportation Needs (03/19/2022)   PRAPARE - Administrator, Civil Service (Medical): No    Lack of Transportation (Non-Medical): No  Physical Activity: Not on file  Stress: Not on file  Social Connections: Not on file   Intimate Partner Violence: Not At Risk (03/13/2022)   Humiliation, Afraid, Rape, and Kick questionnaire    Fear of Current or Ex-Partner: No    Emotionally Abused: No    Physically Abused: No    Sexually Abused: No   Family History  Problem Relation Age of Onset   Heart attack Mother    Hypertension Mother    Heart attack Father    Hypertension Father    Stomach cancer Sister    Brain cancer Sister    BP 102/64   Pulse 65   Wt 86.1 kg (189 lb 12.8 oz)   SpO2 98%   BMI 25.04 kg/m   Wt Readings from Last 3 Encounters:  03/31/22 86.1 kg (189 lb 12.8 oz)  03/18/22 78.4 kg (172 lb 13.5 oz)  03/12/22 91.3 kg (201 lb 3.2 oz)    PHYSICAL EXAM: ReDs 46%  General:  Well appearing. No respiratory difficulty HEENT: normal Neck: supple.  JVD 9 cm. Carotids 2+ bilat; no bruits. No lymphadenopathy or thyromegaly appreciated. Cor: PMI nondisplaced. Regular rate & rhythm. 2/6 AS murmur  Lungs: clear Abdomen: soft, nontender, nondistended. No hepatosplenomegaly. No bruits or masses. Good bowel sounds. Extremities: no cyanosis, clubbing, rash, edema Neuro: alert & oriented x 3, cranial nerves grossly intact. moves all 4 extremities w/o difficulty. Affect pleasant.  ECG Not performed   ASSESSMENT & PLAN: 1. Chronic systolic CHF /ICM - s/p late presentation inferior STEMI 05/23 - Echo (5/23): EF 25-30%, RV moderately reduced, moderate to severe MR. - S/p CABG + bioprosthetic MVR 11/01/21 - Echo 03/12/22 showed EF 20-25%, severe LV dysfunction with global HK, + large Left pleural effusion, RV moderately down, moderate aortic stenosis, suspect component of low gradient AS. - RHC (10/23): Elevated filling pressures with moderately decreased cardiac output. RA 8, PA 48/16, PCWP 22, CO/CI (Fick) 5.7/2.7. - Felt not to be a suitable VAD candidate (Drs. Gala Romney & Bartle discussed). Patient declined VAD as well. - Will focus on aggressive medical therapy with tight volume control. (Potentially  candidate for ADI volume management trial?). - Improved NYHA II-early III, limited by deconditioning.  - Volume overload on exam and by ReDS 46%, BNP elevated 1,676.   - Increase torsemide  to 60 mg bid x 3 days, then back to 60 mg once daily thereafter. BMP today. F/u BNP and BMP in 1 wk  - Continue digoxin 0.0625 mg daily. Check Digoxin level  - Continue Farxiga 10 mg daily.  - Continue spiro 25 mg daily - Continue midodrine 10 mg tid. - No beta blocker yet, still requiring BP support w/ midodrine.   2. CAD/recent late presentation inferior STEMI - LHC 05/23: 100% m RCA and 90% m LAD.  - RCA managed medically d/t completed infarct.  - Echo (5/23) EF 25%, RV moderately reduced, moderate to severe MR - Echo (10/23): EF 20-25%, RV moderately down. - s/p CABG x 2 (LIMA-LAD, SVG-Ramus INT)  (11/01/21) - denies CP  - Continue ASA + high intensity statin.    3. Mitral regurgitation - Moderate to severe ischemic MR on TEE.  - S/p bioprosthetic MVR 11/02/21. - On warfarin per TCTS (3 months post valve). INR followed by [facility].    4. LFLG Aortic stenosis - Moderate on echo 10/23 - May also have to think about TAVR at some point if he is a candidate. - will repeat echo 4/24    5. H/o Pericardial effusion/Acute Pericarditis  - Moderate in size on past echo, w/o tamponade. - Acute pericarditis/ adhensions noted at time of CABG. - Resolved on echo 10/23    6. PAF - s/p MAZE w/ CABG  - RRR on EKG  - Continue amiodarone 100 mg daily.  - Continue warfarin. - INR followed in [facility].     9. Anemia, iron-deficiency - followed by PCP, recently started on PO Fe - he is not interested in having a colonoscopy   F/u w/ APP in 1 month  Torien Ramroop Rosita Fire, [place] 05/15/2022

## 2022-05-15 NOTE — Telephone Encounter (Signed)
-----   Message from Allayne Butcher, [place] sent at 05/15/2022  1:16 PM EST ----- BNP (fluid marker) elevated. Increase torsemide to 60 mg bid and KCl to 60 meq daily x 3 days, then return to torsemide 60 mg once daily + KCl 40 mEq daily. Get repeat BMP and BNP in 1 wk.

## 2022-05-15 NOTE — Chronic Care Management (AMB) (Signed)
Faxed new application to [employer] patient assistance program for [place].  Faxed new application to [employer] assistance program for Midodrine.   Billee Cashing, CMA Clinical Pharmacist Assistant [phone]

## 2022-05-16 ENCOUNTER — Telehealth: Payer: Self-pay

## 2022-05-16 DIAGNOSIS — J449 Chronic obstructive pulmonary disease, unspecified: Secondary | ICD-10-CM | POA: Diagnosis not present

## 2022-05-16 DIAGNOSIS — Z9181 History of falling: Secondary | ICD-10-CM | POA: Diagnosis not present

## 2022-05-16 DIAGNOSIS — Z7982 Long term (current) use of aspirin: Secondary | ICD-10-CM | POA: Diagnosis not present

## 2022-05-16 DIAGNOSIS — Z87891 Personal history of nicotine dependence: Secondary | ICD-10-CM | POA: Diagnosis not present

## 2022-05-16 DIAGNOSIS — I252 Old myocardial infarction: Secondary | ICD-10-CM | POA: Diagnosis not present

## 2022-05-16 DIAGNOSIS — K219 Gastro-esophageal reflux disease without esophagitis: Secondary | ICD-10-CM | POA: Diagnosis not present

## 2022-05-16 DIAGNOSIS — R1319 Other dysphagia: Secondary | ICD-10-CM | POA: Diagnosis not present

## 2022-05-16 DIAGNOSIS — I350 Nonrheumatic aortic (valve) stenosis: Secondary | ICD-10-CM | POA: Diagnosis not present

## 2022-05-16 DIAGNOSIS — Z7901 Long term (current) use of anticoagulants: Secondary | ICD-10-CM | POA: Diagnosis not present

## 2022-05-16 DIAGNOSIS — I5023 Acute on chronic systolic (congestive) heart failure: Secondary | ICD-10-CM | POA: Diagnosis not present

## 2022-05-16 DIAGNOSIS — D509 Iron deficiency anemia, unspecified: Secondary | ICD-10-CM | POA: Diagnosis not present

## 2022-05-16 DIAGNOSIS — Z951 Presence of aortocoronary bypass graft: Secondary | ICD-10-CM | POA: Diagnosis not present

## 2022-05-16 DIAGNOSIS — I480 Paroxysmal atrial fibrillation: Secondary | ICD-10-CM | POA: Diagnosis not present

## 2022-05-16 DIAGNOSIS — M1990 Unspecified osteoarthritis, unspecified site: Secondary | ICD-10-CM | POA: Diagnosis not present

## 2022-05-16 DIAGNOSIS — N1832 Chronic kidney disease, stage 3b: Secondary | ICD-10-CM | POA: Diagnosis not present

## 2022-05-16 DIAGNOSIS — E785 Hyperlipidemia, unspecified: Secondary | ICD-10-CM | POA: Diagnosis not present

## 2022-05-16 DIAGNOSIS — H409 Unspecified glaucoma: Secondary | ICD-10-CM | POA: Diagnosis not present

## 2022-05-16 DIAGNOSIS — Z954 Presence of other heart-valve replacement: Secondary | ICD-10-CM | POA: Diagnosis not present

## 2022-05-16 DIAGNOSIS — K222 Esophageal obstruction: Secondary | ICD-10-CM | POA: Diagnosis not present

## 2022-05-16 DIAGNOSIS — I2510 Atherosclerotic heart disease of native coronary artery without angina pectoris: Secondary | ICD-10-CM | POA: Diagnosis not present

## 2022-05-16 LAB — CBC WITH DIFFERENTIAL/PLATELET
Basophils Absolute: 0.1 10*3/uL (ref 0.0–0.2)
Basos: 2 %
EOS (ABSOLUTE): 0.1 10*3/uL (ref 0.0–0.4)
Eos: 3 %
Hematocrit: 33 % — ABNORMAL LOW (ref 37.5–51.0)
Hemoglobin: 10.5 g/dL — ABNORMAL LOW (ref 13.0–17.7)
Immature Grans (Abs): 0 10*3/uL (ref 0.0–0.1)
Immature Granulocytes: 0 %
Lymphocytes Absolute: 0.6 10*3/uL — ABNORMAL LOW (ref 0.7–3.1)
Lymphs: 12 %
MCH: 25.2 pg — ABNORMAL LOW (ref 26.6–33.0)
MCHC: 31.8 g/dL (ref 31.5–35.7)
MCV: 79 fL (ref 79–97)
Monocytes Absolute: 0.9 10*3/uL (ref 0.1–0.9)
Monocytes: 18 %
Neutrophils Absolute: 3.4 10*3/uL (ref 1.4–7.0)
Neutrophils: 65 %
Platelets: 148 10*3/uL — ABNORMAL LOW (ref 150–450)
RBC: 4.16 x10E6/uL (ref 4.14–5.80)
RDW: 25 % — ABNORMAL HIGH (ref 11.6–15.4)
WBC: 5.2 10*3/uL (ref 3.4–10.8)

## 2022-05-16 NOTE — Chronic Care Management (AMB) (Signed)
05-16-2022: Informed patient's wife that a out of pocket expense report is needed from patient's pharmacy. Patient's wife stated she will go by [facility] next week and drop report off to PCP.  Huey Romans [facility] Clinical Pharmacist Assistant [phone]

## 2022-05-19 ENCOUNTER — Telehealth (HOSPITAL_COMMUNITY): Payer: Self-pay

## 2022-05-19 ENCOUNTER — Telehealth: Payer: Medicare HMO

## 2022-05-19 ENCOUNTER — Ambulatory Visit (INDEPENDENT_AMBULATORY_CARE_PROVIDER_SITE_OTHER): Payer: Medicare HMO

## 2022-05-19 DIAGNOSIS — I10 Essential (primary) hypertension: Secondary | ICD-10-CM

## 2022-05-19 DIAGNOSIS — N1832 Chronic kidney disease, stage 3b: Secondary | ICD-10-CM

## 2022-05-19 DIAGNOSIS — I5022 Chronic systolic (congestive) heart failure: Secondary | ICD-10-CM

## 2022-05-19 MED ORDER — DIGOXIN 125 MCG PO TABS: 0.0625 mg | ORAL_TABLET | ORAL | 2 refills | Status: DC

## 2022-05-19 NOTE — Chronic Care Management (AMB) (Signed)
Chronic Care Management   CCM RN Visit Note  05/19/2022 Name: Keith Jenkins MRN: [id number] DOB: 01-Feb-1946  Subjective: Keith Jenkins is a 76 y.o. year old male who is a primary care patient of Rip Harbour, NP. The patient was referred to the Chronic Care Management team for assistance with care management needs subsequent to provider initiation of CCM services and plan of care.    Today's Visit:  Engaged with patient by telephone for follow up visit.        Goals Addressed             This Visit's Progress    CCM (KIDNEY FAILURE)  EXPECTED OUTCOME:  MONITOR, SELF-MANAGE AND REDUCE SYMPTOMS OF KIDNEY FAILURE       Current Barriers:  Knowledge Deficits related to the importance of normalized blood pressures, compliance with medications, and monitoring for changes in the patients urinary and kidney function Care Coordination needs related to financial constraints and the need for assistance with basic needs for health and well being  in a patient with CKD Chronic Disease Management support and education needs related to effective management of CKD Lacks caregiver support.  Film/video editor.   Planned Interventions: Assessed the patients wife, [name]  understanding of chronic kidney disease    Evaluation of current treatment plan related to chronic kidney disease self management and patient's adherence to plan as established by provider. The patient is compliant with the plan of care. Sees providers on a regular basis.      Provided education to patient re: stroke prevention, s/s of heart attack and stroke    Reviewed prescribed diet heart healthy. The patient is compliant with dietary restrictions. States that he has a good appetite. Is eating a product called "luna life" a broth that has 10 grams of protein in it. Education on monitoring sodium content of foods like broth.  Reviewed medications with patient and discussed importance of compliance.     Advised patient,  providing education and rationale, to monitor blood pressure daily and record, calling PCP for findings outside established parameters    Discussed complications of poorly controlled blood pressure such as heart disease, stroke, circulatory complications, vision complications, kidney impairment, sexual dysfunction    Reviewed scheduled/upcoming provider appointments including: 06-11-2022 with pcp to follow up on insomnia. The patient is still having issues with insomnia.    Advised patient to discuss changes in kidney function with provider    Discussed plans with patient for ongoing care management follow up and provided patient with direct contact information for care management team    Screening for signs and symptoms of depression related to chronic disease state      Discussed the impact of chronic kidney disease on daily life and mental health and acknowledged and normalized feelings of disempowerment, fear, and frustration    Assessed social determinant of health barriers    Provided education on kidney disease progression    Engage patient in early, proactive and ongoing discussion about goals of care and what matters most to them    Support coping and stress management by recognizing current strategies and assist in developing new strategies such as mindfulness, journaling, relaxation techniques, problem-solving     Symptom Management: Take medications as prescribed   Attend all scheduled provider appointments Call provider office for new concerns or questions  call the Suicide and Crisis Lifeline: 988 call the [place] National Suicide Prevention Lifeline: 272 823 4978 or TTY: 321-247-0256 TTY 504 481 2804) to talk to a trained counselor  call 1-800-273-TALK (toll free, 24 hour hotline) if experiencing a Mental Health or Behavioral Health Crisis   Follow Up Plan: Telephone follow up appointment with care management team member scheduled for: 07-01-2022 at 1145 am       CCM Expected  Outcome:  Monitor, Self-Manage and Reduce Symptoms of Heart Failure       Current Barriers:  Knowledge Deficits related to the importance of monitoring for sx and sx of heart failure and to prevent exacerbations Care Coordination needs related to resources needed to maintain health and well being  in a patient with CHF and other chronic conditions Chronic Disease Management support and education needs related to CHF Lacks caregiver support.  Corporate treasurer.   Planned Interventions: Basic overview and discussion of pathophysiology of Heart Failure reviewed Provided education on low sodium diet. Review and education provided about watching hidden sodium in foods. Education on reading labels.  Reviewed Heart Failure Action Plan in depth and provided written copy Assessed need for readable accurate scales in home Provided education about placing scale on hard, flat surface Advised patient to weigh each morning after emptying bladder Discussed importance of daily weight and advised patient to weigh and record daily Reviewed role of diuretics in prevention of fluid overload and management of heart failure Discussed the importance of keeping all appointments with provider. The patient states compliance with appointments and seeing specialist and pcp Referral made to community resources care guide team for assistance with resources in his area to help with healthy food options, and help with financial burdens Advised patient to discuss changes in edema, swelling, or other questions and concerns related to Heart Health with provider Screening for signs and symptoms of depression related to chronic disease state  Assessed social determinant of health barriers The patient is having issues with RLS and the gabapentin is not working per his wife. She states he is all over the bed at night. Collaboration with the pcp and pharm D on recommendations to help with RLS. The patient has stopped taking the  gabapentin because could not see a difference and did not want to take "too much" medications for insomnia and RLS. Education provided. The patient also has a follow up with the pharm D this week and will send an inbasket message to the phram D for follow up and education needs related to medications.  The patient is not having success with the [place] either. Has not gotten the [place]. Education provided. Will let the pcp and phram D know. The patients wife is concerned about him taking too much medications. Advised it would be okay for him to take the gabapentin and belsomra.  Advised the wife to write down questions to ask the pharm D. Also she was concerned as the Ambien was $114.00 so she has not gotten it yet.   Symptom Management: Take medications as prescribed   Attend all scheduled provider appointments Call provider office for new concerns or questions  Work with the social worker to address care coordination needs and will continue to work with the clinical team to address health care and disease management related needs call the Suicide and Crisis Lifeline: 988 call the [place] National Suicide Prevention Lifeline: 272-568-2135 or TTY: 218-242-2428 TTY (781)269-0003) to talk to a trained counselor call 1-800-273-TALK (toll free, 24 hour hotline) if experiencing a Mental Health or Behavioral Health Crisis  call office if I gain more than 2 pounds in one day or 5 pounds in one week keep legs up  while sitting track weight in diary use salt in moderation watch for swelling in feet, ankles and legs every day weigh myself daily begin a heart failure diary bring diary to all appointments develop a rescue plan follow rescue plan if symptoms flare-up track symptoms and what helps feel better or worse dress right for the weather, hot or cold  Follow Up Plan: Telephone follow up appointment with care management team member scheduled for: 07-01-2022 at 1145 am       CCM Expected Outcome:   Monitor, Self-Manage, and Reduce Symptoms of Hypertension       Current Barriers:  Knowledge Deficits related to the importance of normalized blood pressures and monitoring for changes in blood pressures in a patient with HTN and also hypotension at times  Care Coordination needs related to financial needs and resources  in a patient with HTN and other chronic conditions Chronic Disease Management support and education needs related to effective management of HTN Lacks caregiver support.  Film/video editor.   BP Readings from Last 3 Encounters:  05/15/22 108/68  05/14/22 110/72  04/14/22 112/64   Self reported blood pressure this am was 110/60 Planned Interventions: Evaluation of current treatment plan related to hypertension self management and patient's adherence to plan as established by provider. The patients blood pressures have been good. Review of changing position slowly to prevent orthostatic hypotension. Review of safety and falls prevention;   Provided education to patient re: stroke prevention, s/s of heart attack and stroke; Reviewed prescribed diet heart healthy Reviewed medications with patient and discussed importance of compliance. States compliance with medications;  Discussed plans with patient for ongoing care management follow up and provided patient with direct contact information for care management team; Advised patient, providing education and rationale, to monitor blood pressure daily and record, calling PCP for findings outside established parameters. The patients wife states that his blood pressures have been running better at 110's over 60's. She states it has been as low as 89/60. Review of monitoring for falls and safety concerns and light headedness or dizziness. The patient has a history of frequent falls Reviewed scheduled/upcoming provider appointments including: 06-11-2022 with pcp Advised patient to discuss changes in HTN or heart health with  provider; Provided education on prescribed diet hear healthy diet, also a care guide referral placed for assistance with food resources due to financial difficulties. Care guides have been in touch with the patient and his wife. They have resource information ;  Discussed complications of poorly controlled blood pressure such as heart disease, stroke, circulatory complications, vision complications, kidney impairment, sexual dysfunction;  Screening for signs and symptoms of depression related to chronic disease state;  Assessed social determinant of health barriers;   Symptom Management: Take medications as prescribed   Attend all scheduled provider appointments Call provider office for new concerns or questions  Work with the social worker to address care coordination needs and will continue to work with the clinical team to address health care and disease management related needs call the Suicide and Crisis Lifeline: 988 call the [place] National Suicide Prevention Lifeline: (318)156-3931 or TTY: (581)693-0653 TTY 360-440-1585) to talk to a trained counselor call 1-800-273-TALK (toll free, 24 hour hotline) if experiencing a Mental Health or [place]  check blood pressure 3 times per week write blood pressure results in a log or diary learn about high blood pressure keep a blood pressure log take blood pressure log to all doctor appointments call doctor for signs and symptoms  of high blood pressure develop an action plan for high blood pressure keep all doctor appointments take medications for blood pressure exactly as prescribed report new symptoms to your doctor  Follow Up Plan: Telephone follow up appointment with care management team member scheduled for: 07-01-2022 at 1145 am          Plan:Telephone follow up appointment with care management team member scheduled for:  07-01-2022 at 1145 am  Noreene Larsson RN, MSN, CCM RN Care Manager  Chronic Care Management Direct  Number: [phone]

## 2022-05-19 NOTE — Patient Instructions (Signed)
Please call the care guide team at [phone] if you need to cancel or reschedule your appointment.   If you are experiencing a Mental Health or Behavioral Health Crisis or need someone to talk to, please call the Suicide and Crisis Lifeline: 988 call the [place] National Suicide Prevention Lifeline: 505-382-6813 or TTY: (737)816-2942 TTY (681)002-1646) to talk to a trained counselor call 1-800-273-TALK (toll free, 24 hour hotline)   Following is a copy of your full provider care plan:   Goals Addressed             This Visit's Progress    CCM (KIDNEY FAILURE)  EXPECTED OUTCOME:  MONITOR, SELF-MANAGE AND REDUCE SYMPTOMS OF KIDNEY FAILURE       Current Barriers:  Knowledge Deficits related to the importance of normalized blood pressures, compliance with medications, and monitoring for changes in the patients urinary and kidney function Care Coordination needs related to financial constraints and the need for assistance with basic needs for health and well being  in a patient with CKD Chronic Disease Management support and education needs related to effective management of CKD Lacks caregiver support.  Corporate treasurer.   Planned Interventions: Assessed the patients wife, Bonita Quin  understanding of chronic kidney disease    Evaluation of current treatment plan related to chronic kidney disease self management and patient's adherence to plan as established by provider. The patient is compliant with the plan of care. Sees providers on a regular basis.      Provided education to patient re: stroke prevention, s/s of heart attack and stroke    Reviewed prescribed diet heart healthy. The patient is compliant with dietary restrictions. States that he has a good appetite. Is eating a product called "luna life" a broth that has 10 grams of protein in it. Education on monitoring sodium content of foods like broth.  Reviewed medications with patient and discussed importance of compliance.      Advised patient, providing education and rationale, to monitor blood pressure daily and record, calling PCP for findings outside established parameters    Discussed complications of poorly controlled blood pressure such as heart disease, stroke, circulatory complications, vision complications, kidney impairment, sexual dysfunction    Reviewed scheduled/upcoming provider appointments including: 06-11-2022 with pcp to follow up on insomnia. The patient is still having issues with insomnia.    Advised patient to discuss changes in kidney function with provider    Discussed plans with patient for ongoing care management follow up and provided patient with direct contact information for care management team    Screening for signs and symptoms of depression related to chronic disease state      Discussed the impact of chronic kidney disease on daily life and mental health and acknowledged and normalized feelings of disempowerment, fear, and frustration    Assessed social determinant of health barriers    Provided education on kidney disease progression    Engage patient in early, proactive and ongoing discussion about goals of care and what matters most to them    Support coping and stress management by recognizing current strategies and assist in developing new strategies such as mindfulness, journaling, relaxation techniques, problem-solving     Symptom Management: Take medications as prescribed   Attend all scheduled provider appointments Call provider office for new concerns or questions  call the Suicide and Crisis Lifeline: 988 call the [place] National Suicide Prevention Lifeline: (650)828-0446 or TTY: 4053523303 TTY 470-372-8892) to talk to a trained counselor call 1-800-273-TALK (toll free, 24  hour hotline) if experiencing a Mental Health or Behavioral Health Crisis   Follow Up Plan: Telephone follow up appointment with care management team member scheduled for: 07-01-2022 at 1145 am        CCM Expected Outcome:  Monitor, Self-Manage and Reduce Symptoms of Heart Failure       Current Barriers:  Knowledge Deficits related to the importance of monitoring for sx and sx of heart failure and to prevent exacerbations Care Coordination needs related to resources needed to maintain health and well being  in a patient with CHF and other chronic conditions Chronic Disease Management support and education needs related to CHF Lacks caregiver support.  Corporate treasurer.   Planned Interventions: Basic overview and discussion of pathophysiology of Heart Failure reviewed Provided education on low sodium diet. Review and education provided about watching hidden sodium in foods. Education on reading labels.  Reviewed Heart Failure Action Plan in depth and provided written copy Assessed need for readable accurate scales in home Provided education about placing scale on hard, flat surface Advised patient to weigh each morning after emptying bladder Discussed importance of daily weight and advised patient to weigh and record daily Reviewed role of diuretics in prevention of fluid overload and management of heart failure Discussed the importance of keeping all appointments with provider. The patient states compliance with appointments and seeing specialist and pcp Referral made to community resources care guide team for assistance with resources in his area to help with healthy food options, and help with financial burdens Advised patient to discuss changes in edema, swelling, or other questions and concerns related to Heart Health with provider Screening for signs and symptoms of depression related to chronic disease state  Assessed social determinant of health barriers The patient is having issues with RLS and the gabapentin is not working per his wife. She states he is all over the bed at night. Collaboration with the pcp and pharm D on recommendations to help with RLS. The patient has stopped  taking the gabapentin because could not see a difference and did not want to take "too much" medications for insomnia and RLS. Education provided. The patient also has a follow up with the pharm D this week and will send an inbasket message to the phram D for follow up and education needs related to medications.  The patient is not having success with the [place] either. Has not gotten the [place]. Education provided. Will let the pcp and phram D know. The patients wife is concerned about him taking too much medications. Advised it would be okay for him to take the gabapentin and belsomra.  Advised the wife to write down questions to ask the pharm D. Also she was concerned as the Ambien was $114.00 so she has not gotten it yet.   Symptom Management: Take medications as prescribed   Attend all scheduled provider appointments Call provider office for new concerns or questions  Work with the social worker to address care coordination needs and will continue to work with the clinical team to address health care and disease management related needs call the Suicide and Crisis Lifeline: 988 call the [place] National Suicide Prevention Lifeline: 2088329127 or TTY: (623)851-5937 TTY 978-036-2850) to talk to a trained counselor call 1-800-273-TALK (toll free, 24 hour hotline) if experiencing a Mental Health or Behavioral Health Crisis  call office if I gain more than 2 pounds in one day or 5 pounds in one week keep legs up while sitting track weight in  diary use salt in moderation watch for swelling in feet, ankles and legs every day weigh myself daily begin a heart failure diary bring diary to all appointments develop a rescue plan follow rescue plan if symptoms flare-up track symptoms and what helps feel better or worse dress right for the weather, hot or cold  Follow Up Plan: Telephone follow up appointment with care management team member scheduled for: 07-01-2022 at 1145 am       CCM Expected  Outcome:  Monitor, Self-Manage, and Reduce Symptoms of Hypertension       Current Barriers:  Knowledge Deficits related to the importance of normalized blood pressures and monitoring for changes in blood pressures in a patient with HTN and also hypotension at times  Care Coordination needs related to financial needs and resources  in a patient with HTN and other chronic conditions Chronic Disease Management support and education needs related to effective management of HTN Lacks caregiver support.  Corporate treasurer.   BP Readings from Last 3 Encounters:  05/15/22 108/68  05/14/22 110/72  04/14/22 112/64   Self reported blood pressure this am was 110/60 Planned Interventions: Evaluation of current treatment plan related to hypertension self management and patient's adherence to plan as established by provider. The patients blood pressures have been good. Review of changing position slowly to prevent orthostatic hypotension. Review of safety and falls prevention;   Provided education to patient re: stroke prevention, s/s of heart attack and stroke; Reviewed prescribed diet heart healthy Reviewed medications with patient and discussed importance of compliance. States compliance with medications;  Discussed plans with patient for ongoing care management follow up and provided patient with direct contact information for care management team; Advised patient, providing education and rationale, to monitor blood pressure daily and record, calling PCP for findings outside established parameters. The patients wife states that his blood pressures have been running better at 110's over 60's. She states it has been as low as 89/60. Review of monitoring for falls and safety concerns and light headedness or dizziness. The patient has a history of frequent falls Reviewed scheduled/upcoming provider appointments including: 06-11-2022 with pcp Advised patient to discuss changes in HTN or heart health with  provider; Provided education on prescribed diet hear healthy diet, also a care guide referral placed for assistance with food resources due to financial difficulties. Care guides have been in touch with the patient and his wife. They have resource information ;  Discussed complications of poorly controlled blood pressure such as heart disease, stroke, circulatory complications, vision complications, kidney impairment, sexual dysfunction;  Screening for signs and symptoms of depression related to chronic disease state;  Assessed social determinant of health barriers;   Symptom Management: Take medications as prescribed   Attend all scheduled provider appointments Call provider office for new concerns or questions  Work with the social worker to address care coordination needs and will continue to work with the clinical team to address health care and disease management related needs call the Suicide and Crisis Lifeline: 988 call the [place] National Suicide Prevention Lifeline: (708)260-3158 or TTY: 858-071-4004 TTY (503) 825-6995) to talk to a trained counselor call 1-800-273-TALK (toll free, 24 hour hotline) if experiencing a Mental Health or Behavioral Health Crisis  check blood pressure 3 times per week write blood pressure results in a log or diary learn about high blood pressure keep a blood pressure log take blood pressure log to all doctor appointments call doctor for signs and symptoms of high blood pressure develop  an action plan for high blood pressure keep all doctor appointments take medications for blood pressure exactly as prescribed report new symptoms to your doctor  Follow Up Plan: Telephone follow up appointment with care management team member scheduled for: 07-01-2022 at 1145 am          Patient verbalizes understanding of instructions and care plan provided today and agrees to view in MyChart. Active MyChart status and patient understanding of how to access  instructions and care plan via MyChart confirmed with patient.     Telephone follow up appointment with care management team member scheduled for: 07-01-2022 at 1145 am

## 2022-05-19 NOTE — Telephone Encounter (Signed)
-----   Message from Allayne Butcher, [place] sent at 05/16/2022  3:01 PM EST ----- Dig level slightly elevated. Reduce to 0.0625 mg QOD. Repeat dig level and BMP in 1 wk

## 2022-05-19 NOTE — Telephone Encounter (Signed)
Patient and wife advised and verbalized understanding. Patient will have repeat labs drawn in  [place], med list updated to reflect changes.

## 2022-05-20 ENCOUNTER — Ambulatory Visit: Payer: Medicare HMO | Attending: Cardiology

## 2022-05-20 ENCOUNTER — Encounter (HOSPITAL_COMMUNITY): Payer: Self-pay

## 2022-05-20 DIAGNOSIS — I5021 Acute systolic (congestive) heart failure: Secondary | ICD-10-CM | POA: Diagnosis not present

## 2022-05-20 DIAGNOSIS — I5022 Chronic systolic (congestive) heart failure: Secondary | ICD-10-CM

## 2022-05-20 DIAGNOSIS — Z7901 Long term (current) use of anticoagulants: Secondary | ICD-10-CM | POA: Diagnosis not present

## 2022-05-20 DIAGNOSIS — Z952 Presence of prosthetic heart valve: Secondary | ICD-10-CM | POA: Diagnosis not present

## 2022-05-20 LAB — POCT INR: INR: 2.8 (ref 2.0–3.0)

## 2022-05-20 NOTE — Patient Instructions (Signed)
Description   Continue taking 3 tablets daily.  Recheck INR in 3 weeks.  [facility] [phone] Fax Clearance form to [phone]

## 2022-05-20 NOTE — Telephone Encounter (Signed)
MEDICATION CONCERNS COMPLETED WITH PTS DAUGHTER MED LIST UPDATED AND DAUGHTER WILL CONTACT OFFICE FOR LABS

## 2022-05-22 ENCOUNTER — Other Ambulatory Visit (HOSPITAL_COMMUNITY): Payer: Self-pay

## 2022-05-22 ENCOUNTER — Telehealth: Payer: Self-pay | Admitting: Cardiology

## 2022-05-22 ENCOUNTER — Ambulatory Visit: Payer: Medicare HMO

## 2022-05-22 ENCOUNTER — Telehealth: Payer: Self-pay

## 2022-05-22 DIAGNOSIS — Z954 Presence of other heart-valve replacement: Secondary | ICD-10-CM | POA: Diagnosis not present

## 2022-05-22 DIAGNOSIS — Z7901 Long term (current) use of anticoagulants: Secondary | ICD-10-CM | POA: Diagnosis not present

## 2022-05-22 DIAGNOSIS — I350 Nonrheumatic aortic (valve) stenosis: Secondary | ICD-10-CM | POA: Diagnosis not present

## 2022-05-22 DIAGNOSIS — K222 Esophageal obstruction: Secondary | ICD-10-CM | POA: Diagnosis not present

## 2022-05-22 DIAGNOSIS — Z951 Presence of aortocoronary bypass graft: Secondary | ICD-10-CM | POA: Diagnosis not present

## 2022-05-22 DIAGNOSIS — J449 Chronic obstructive pulmonary disease, unspecified: Secondary | ICD-10-CM | POA: Diagnosis not present

## 2022-05-22 DIAGNOSIS — I480 Paroxysmal atrial fibrillation: Secondary | ICD-10-CM | POA: Diagnosis not present

## 2022-05-22 DIAGNOSIS — I5022 Chronic systolic (congestive) heart failure: Secondary | ICD-10-CM

## 2022-05-22 DIAGNOSIS — Z87891 Personal history of nicotine dependence: Secondary | ICD-10-CM | POA: Diagnosis not present

## 2022-05-22 DIAGNOSIS — I252 Old myocardial infarction: Secondary | ICD-10-CM | POA: Diagnosis not present

## 2022-05-22 DIAGNOSIS — Z7982 Long term (current) use of aspirin: Secondary | ICD-10-CM | POA: Diagnosis not present

## 2022-05-22 DIAGNOSIS — I10 Essential (primary) hypertension: Secondary | ICD-10-CM

## 2022-05-22 DIAGNOSIS — E782 Mixed hyperlipidemia: Secondary | ICD-10-CM

## 2022-05-22 DIAGNOSIS — M1990 Unspecified osteoarthritis, unspecified site: Secondary | ICD-10-CM | POA: Diagnosis not present

## 2022-05-22 DIAGNOSIS — K219 Gastro-esophageal reflux disease without esophagitis: Secondary | ICD-10-CM | POA: Diagnosis not present

## 2022-05-22 DIAGNOSIS — N1832 Chronic kidney disease, stage 3b: Secondary | ICD-10-CM | POA: Diagnosis not present

## 2022-05-22 DIAGNOSIS — Z9181 History of falling: Secondary | ICD-10-CM | POA: Diagnosis not present

## 2022-05-22 DIAGNOSIS — E785 Hyperlipidemia, unspecified: Secondary | ICD-10-CM | POA: Diagnosis not present

## 2022-05-22 DIAGNOSIS — I2510 Atherosclerotic heart disease of native coronary artery without angina pectoris: Secondary | ICD-10-CM | POA: Diagnosis not present

## 2022-05-22 DIAGNOSIS — R1319 Other dysphagia: Secondary | ICD-10-CM | POA: Diagnosis not present

## 2022-05-22 DIAGNOSIS — H409 Unspecified glaucoma: Secondary | ICD-10-CM | POA: Diagnosis not present

## 2022-05-22 DIAGNOSIS — I5023 Acute on chronic systolic (congestive) heart failure: Secondary | ICD-10-CM | POA: Diagnosis not present

## 2022-05-22 DIAGNOSIS — D509 Iron deficiency anemia, unspecified: Secondary | ICD-10-CM | POA: Diagnosis not present

## 2022-05-22 DIAGNOSIS — R7303 Prediabetes: Secondary | ICD-10-CM

## 2022-05-22 NOTE — Telephone Encounter (Signed)
New Message:     Keith Jenkins says she need a verbal order for patient to have his INR drawn at home. Please.

## 2022-05-22 NOTE — Progress Notes (Signed)
Chronic Care Management Pharmacy Note  05/22/2022 Name:  Keith Jenkins MRN:  [id number] DOB:  1945-08-03  Summary: -Very very very pleasant couple presents for f/u CCM visit. They have been married for 56 years. He worked as a Administrator. Keith Jenkins, his wife, grew up in [place]. Writes poems. Read me one she just finished called "Dust on the Bible"   Recommendations/Changes made from today's visit: -F/U PAP for Trelegy and Dapagliflozin -Coordinated with Cardio (See CP) for grant in November. Sent direct msg to them today to check status -Asked [facility] for Trelegy samples  Subjective: Keith Jenkins is an 76 y.o. year old male who is a primary patient of Rip Harbour, NP.  The CCM team was consulted for assistance with disease management and care coordination needs.    Engaged with patient by telephone for follow up visit in response to provider referral for pharmacy case management and/or care coordination services.   Consent to Services:  The patient was given the following information about Chronic Care Management services today, agreed to services, and gave verbal consent: 1. CCM service includes personalized support from designated clinical staff supervised by the primary care provider, including individualized plan of care and coordination with other care providers 2. 24/7 contact phone numbers for assistance for urgent and routine care needs. 3. Service will only be billed when office clinical staff spend 20 minutes or more in a month to coordinate care. 4. Only one practitioner may furnish and bill the service in a calendar month. 5.The patient may stop CCM services at any time (effective at the end of the month) by phone call to the office staff. 6. The patient will be responsible for cost sharing (co-pay) of up to 20% of the service fee (after annual deductible is met). Patient agreed to services and consent obtained.  Patient Care Team: Rip Harbour, NP as PCP -  General (Nurse Practitioner) Haroldine Laws Shaune Pascal, MD as PCP - Advanced Heart Failure (Cardiology) Bensimhon, Shaune Pascal, MD as PCP - Cardiology (Cardiology) [name], RN as Case Manager ([name]) Lane Hacker, [facility] (Pharmacist)  Recent office visits:  04-17-2022 [name], RN (CCM).   04-14-2022 Rip Harbour, NP. START Gabapentin 600 mg at bedtime.    02-19-2022 Rochel Brome, MD. Flu vaccine given.    01-06-2022 Rip Harbour, NP. Hemo= 10.3, Hema= 32.2, MCV= 75, MCH= 24.0, RDW= 17.1. Creatinine= 1.32, eGFR= 57, Chloride= 93. HDL= 36   Recent consult visits:  04-15-2022 Leonidas Romberg, RN (Cardiology). Anti coag visit.   04-08-2022 Jerene Bears, MD. ESOPHAGOGASTRODUODENOSCOPY (EGD) WITH PROPOFOL procedure.    04-01-2022 Leonidas Romberg, RN (Cardiology). Anti coag visit.   03-31-2022 Rafael Bihari, FNP (Vascular). B Natriuretic Peptide= 1,681.5. Sodium= 134, Glucose= 135, BUN= 32, Creatinine= 1.91, Calcium= 8.7, GFR, Estimated= 36. RBC= 4.10, Hemo= 10.2, HCT= 30.7, MCV= 74.9, MCH= 24.9, RDW= 29.9.    03-25-2022 Leonidas Romberg, RN (Cardiology). Anti coag visit.   03-12-2022 Bensimhon, Shaune Pascal, MD  (Cardiology). RIGHT HEART CATH and THORACENTESIS procedures.   03-12-2022 Rafael Bihari, FNP (Vascular). B natriuretic peptide= 1,417.5. RBC= 3.96, Hemo= 8.5, HCT= 26.9, MCV= 67.9, MCH= 21.5, RDW= 19.2. BUN= 34, Creatinine= 1.96, Albumin= 3.2, Egfr= 35.   03-11-2022 Leonidas Romberg, RN (Cardiology). Anti coag visit.   03-04-2022 Leonidas Romberg, RN (Cardiology). Anti coag visit.   02-25-2022 Leonidas Romberg, RN (Cardiology). Anti coag visit.   02-18-2022 Leonidas Romberg, RN (Cardiology).  Anti coag visit.   02-14-2022 Rafael Bihari, FNP. B natriuretic peptide= 1, 321.3. Glucose= 100, Creatinine= 1.59, Gfr= 45.   02-11-2022 Hemphill, Candance B, RN (Cardiology). Anti coag visit.   02-04-2022 Hemphill, Myrtis Hopping, RN  (Cardiology). Anti coag visit.   02-04-2022 Pyrtle, Lajuan Lines, MD Gertie Fey). Follow up visit.   01-22-2022 Gaye Pollack, MD (Cardiothoracic Surgery). Follow up.   01-21-2022 Leonidas Romberg, RN (Cardiology). Anti coag visit.   01-14-2022 Leonidas Romberg, RN (Cardiology). Anti coag visit.   01-14-2022 Scarlette Calico, RN (Vascular). DECREASE Midodrine to 5 mg Three times a day and START Zetia 5 mg (1/2 tab) Daily.   01-07-2022 Leonidas Romberg, RN (Cardiology). Anti coag visit.   12-31-2021 Leonidas Romberg, RN (Cardiology). Anti coag visit.   12-24-2021 Leonidas Romberg, RN (Cardiology). Anti coag visit.   12-17-2021 Leonidas Romberg, RN (Cardiology). Anti coag visit.   12-11-2021 Leonidas Romberg, RN (Cardiology). Anti coag visit.   12-05-2021 Gaye Pollack, MD (Cardiothoracic). Follow up visit.   12-04-2021 Rafael Bihari, FNP (Vascular). B Natriuretic Peptide= 1,268.7. Chloride= 96, Creatinine= 1.36, Calcium= 8.5, Egfr= 55.   12-03-2021 Leonidas Romberg, RN (Cardiology). Anti coag visit.   11-26-2021 Leonidas Romberg, RN (Cardiology). Anti coag visit.   Hospital visits:  Medication Reconciliation was completed by comparing discharge summary, patient's EMR and Pharmacy list, and upon discussion with patient.   Admitted to the hospital on 02-20-2022 due to Fall encounter. Discharge date was 02-20-2022. Discharged from [place]?Medications Started at [facility] Discharge:?? None   Medication Changes at Hospital Discharge: None   Medications Discontinued at Hospital Discharge: None   Medications that remain the same after Hospital Discharge:??  -All other medications will remain the same.     Hospital visits:  Medication Reconciliation was completed by comparing discharge summary, patient's EMR and Pharmacy list, and upon discussion with patient.   Admitted to the hospital on 02-12-2022 due to Scalp laceration. Discharge date was  02-12-2022. Discharged from [place]?Medications Started at [facility] Discharge:?? None   Medication Changes at Hospital Discharge: None   Medications Discontinued at Hospital Discharge: None   Medications that remain the same after Hospital Discharge:??  -All other medications will remain the same.     Hospital visits:  Medication Reconciliation was completed by comparing discharge summary, patient's EMR and Pharmacy list, and upon discussion with patient.   Admitted to the hospital on 01-28-2022 due to Fall encounter. Discharge date was 01-28-2022. Discharged from [place]?Medications Started at [facility] Discharge:?? None   Medication Changes at Hospital Discharge: None   Medications Discontinued at Hospital Discharge: None   Medications that remain the same after Hospital Discharge:??  -All other medications will remain the same.     Objective:  Lab Results  Component Value Date   CREATININE 2.15 (H) 05/15/2022   BUN 48 (H) 05/15/2022   EGFR 57 (L) 01/06/2022   GFRNONAA 32 (L) 05/15/2022   GFRAA 65 03/27/2020   NA 136 05/15/2022   K 3.6 05/15/2022   CALCIUM 8.8 (L) 05/15/2022   CO2 26 05/15/2022   GLUCOSE 108 (H) 05/15/2022    Lab Results  Component Value Date/Time   HGBA1C 6.0 (H) 03/18/2022 05:58 AM   HGBA1C 6.0 (H) 11/01/2021 04:30 AM    Last diabetic Eye exam: No results found for: "HMDIABEYEEXA"  Last diabetic Foot exam:  No results found for: "HMDIABFOOTEX"   Lab Results  Component Value Date   CHOL 75 03/18/2022   HDL 33 (L) 03/18/2022   LDLCALC 29 03/18/2022   TRIG 63 03/18/2022   CHOLHDL 2.3 03/18/2022       Latest Ref Rng & Units 03/18/2022    5:56 AM 03/12/2022   12:20 PM 02/12/2022    5:06 PM  Hepatic Function  Total Protein 6.5 - 8.1 g/dL 6.9  6.7  5.9   Albumin 3.5 - 5.0 g/dL 3.3  3.2  2.7   AST 15 - 41 U/L 33  35  35   ALT 0 - 44 U/L '27  30  24   ' Alk Phosphatase 38 - 126 U/L 89  90  91    Total Bilirubin 0.3 - 1.2 mg/dL 1.2  1.1  1.0   Bilirubin, Direct 0.0 - 0.2 mg/dL 0.2       Lab Results  Component Value Date/Time   TSH 1.049 03/18/2022 05:58 AM   TSH 0.691 03/12/2022 12:14 PM   TSH 1.100 03/27/2020 11:16 AM   FREET4 1.24 (H) 03/18/2022 05:58 AM       Latest Ref Rng & Units 05/15/2022    2:03 PM 03/31/2022    2:41 PM 03/17/2022    3:25 PM  CBC  WBC 3.4 - 10.8 x10E3/uL 5.2  5.2    Hemoglobin 13.0 - 17.7 g/dL 10.5  10.2  10.2   Hematocrit 37.5 - 51.0 % 33.0  30.7  30.0   Platelets 150 - 450 x10E3/uL 148  198      Lab Results  Component Value Date/Time   VITAMINB12 812 05/14/2022 09:18 AM   VITAMINB12 606 03/13/2022 07:56 AM    Clinical ASCVD: Yes  The ASCVD Risk score (Arnett DK, et al., 2019) failed to calculate for the following reasons:   The patient has a prior MI or stroke diagnosis       04/14/2022   10:06 AM 10/23/2021   11:56 AM 07/09/2021   10:05 AM  Depression screen PHQ 2/9  Decreased Interest 0 0 0  Down, Depressed, Hopeless 0 1 0  PHQ - 2 Score 0 1 0  Altered sleeping 0 1   Tired, decreased energy 0 1   Change in appetite 0 1   Feeling bad or failure about yourself  0 1   Trouble concentrating 0 0   Moving slowly or fidgety/restless 0 0   Suicidal thoughts 0 0   PHQ-9 Score 0 5   Difficult doing work/chores Not difficult at all Not difficult at all      Other: (CHADS2VASc if Afib, MMRC or CAT for COPD, ACT, DEXA)  Social History   Tobacco Use  Smoking Status Former   Types: Cigarettes   Quit date: 1970   Years since quitting: 53.9  Smokeless Tobacco Never   BP Readings from Last 3 Encounters:  05/15/22 108/68  05/14/22 110/72  04/14/22 112/64   Pulse Readings from Last 3 Encounters:  05/15/22 64  05/14/22 65  04/14/22 61   Wt Readings from Last 3 Encounters:  05/15/22 193 lb (87.5 kg)  05/14/22 195 lb (88.5 kg)  04/14/22 188 lb (85.3 kg)   BMI Readings from Last 3 Encounters:  05/15/22 24.78 kg/m  05/14/22  25.04 kg/m  04/14/22 24.80 kg/m    Assessment/Interventions: Review of patient past medical history, allergies, medications, health status, including review of consultants reports, laboratory and other test data, was performed as part  of comprehensive evaluation and provision of chronic care management services.   SDOH:  (Social Determinants of Health) assessments and interventions performed: Yes SDOH Interventions    Flowsheet Row Chronic Care Management from 05/22/2022 in [place] Management from 04/24/2022 in [place] Telephone from 04/18/2022 in [place] Management from 04/17/2022 in [place] Office Visit from 04/14/2022 in [place] Telephone from 03/19/2022 in [place] Interventions -- -- Other (Comment)  [Mailed food pantry list] Other (Comment)  [states it is getting harder to get food, is receptive to talking to care guides for food resources] -- Intervention Not Indicated  Housing Interventions -- -- -- Other (Comment)  [has repairs that are needed in the home and cannot afford, open to talking to careguides for assistance with resources in their area] -- Intervention Not Indicated  [resides with spouse]  Transportation Interventions Other (Comment)  [Pharmacy delivering meds now] Intervention Not Indicated -- Intervention Not Indicated -- Intervention Not Indicated  [spouse/ family provides transportation]  Utilities Interventions -- -- Other (Comment)  [Mailed utility assistance resources] Ambulatory [id number] Order  [needs assistance with utilities] -- --  Alcohol Usage Interventions -- -- -- Intervention Not Indicated (Score <7) -- --  Financial Strain Interventions Other (Comment)  [PAP's] Other (Comment)  [PAP] -- Other (Comment)  [careguide referral for resources in the area to help with  financial assistance, food, and utilities] Intervention Not Indicated --  Physical Activity Interventions -- -- -- -- Intervention Not Indicated --  Stress Interventions -- -- -- -- Intervention Not Indicated --  Social Connections Interventions -- -- -- Intervention Not Indicated -- --      SDOH Screenings   Food Insecurity: No Food Insecurity (04/18/2022)  Housing: Low Risk  (04/17/2022)  Transportation Needs: Unmet Transportation Needs (05/22/2022)  Utilities: Not At Risk (04/18/2022)  Alcohol Screen: Low Risk  (04/17/2022)  Depression (PHQ2-9): Low Risk  (04/14/2022)  Financial Resource Strain: High Risk (05/22/2022)  Physical Activity: Sufficiently Active (04/14/2022)  Social Connections: Moderately Integrated (04/17/2022)  Stress: No Stress Concern Present (04/14/2022)  Tobacco Use: Medium Risk (05/15/2022)    CCM Care Plan  No Known Allergies  Medications Reviewed Today     Reviewed by Lane Hacker, [facility] (Pharmacist) on 05/22/22 at 0926  Med List Status: <None>   Medication Order Taking? Sig Documenting Provider Last Dose Status Informant  albuterol (PROVENTIL) (2.5 MG/3ML) 0.083% nebulizer solution [id number] No Inhale 3 mLs into the lungs every 6 (six) hours as needed for wheezing or shortness of breath. Rip Harbour, NP Taking Active   albuterol (VENTOLIN HFA) 108 (90 Base) MCG/ACT inhaler [id number] No Inhale 2 puffs into the lungs every 6 (six) hours as needed for wheezing or shortness of breath. Rip Harbour, NP Taking Active   amiodarone (PACERONE) 200 MG tablet [id number] No Take 0.5 tablets (100 mg total) by mouth daily. Bensimhon, Shaune Pascal, MD Taking Active   aspirin EC 81 MG tablet [id number] No Take 1 tablet (81 mg total) by mouth daily. Swallow whole. Bensimhon, Shaune Pascal, MD Taking Active   atorvastatin (LIPITOR) 80 MG tablet [id number] No Take 1 tablet (80 mg total) by mouth daily. Bensimhon, Shaune Pascal, MD Taking Active   dapagliflozin propanediol (FARXIGA)  10 MG TABS tablet [id number] No Take 1 tablet (10 mg total) by mouth daily. Bensimhon, Shaune Pascal, MD  Taking Active   digoxin (LANOXIN) 0.125 MG tablet [id number]  Take 0.5 tablets (0.0625 mg total) by mouth every other day. Lyda Jester M, PA-C  Active   escitalopram (LEXAPRO) 10 MG tablet [id number] No Take 1 tablet (10 mg total) by mouth daily. Rip Harbour, NP Taking Active   esomeprazole (NEXIUM) 40 MG capsule [id number] No Take 40 mg by mouth daily. [provider] Taking Active Spouse/Significant Other  ezetimibe (ZETIA) 10 MG tablet [id number] No Take 0.5 tablets (5 mg total) by mouth daily. Bensimhon, Shaune Pascal, MD Taking Active   Fluticasone-Umeclidin-Vilant (TRELEGY ELLIPTA) 100-62.5-25 MCG/ACT AEPB [id number] No Inhale 1 puff into the lungs daily. Rip Harbour, NP Taking Active   Fluticasone-Umeclidin-Vilant (TRELEGY ELLIPTA) 100-62.5-25 MCG/ACT AEPB [id number] No Inhale 1 puff into the lungs daily. Rip Harbour, NP Taking Active   gabapentin (NEURONTIN) 600 MG tablet [id number] No Take 1 tablet (600 mg total) by mouth at bedtime. Rip Harbour, NP Taking Active   Iron, Ferrous Sulfate, 325 (65 Fe) MG TABS [id number] No Take 325 mg by mouth daily. Rip Harbour, NP Taking Active   latanoprost (XALATAN) 0.005 % ophthalmic solution [id number] No Place 1 drop into both eyes at bedtime. [provider] Taking Active Spouse/Significant Other  midodrine (PROAMATINE) 10 MG tablet [id number] No Take 1 tablet (10 mg total) by mouth 3 (three) times daily. Bensimhon, Shaune Pascal, MD Taking Active   nitroGLYCERIN (NITROSTAT) 0.4 MG SL tablet [id number] No Place 1 tablet (0.4 mg total) under the tongue every 5 (five) minutes as needed for chest pain. [place], Maricela Bo, FNP Taking Active Spouse/Significant Other  OVER THE COUNTER MEDICATION [id number] No Apply 1 application  topically at bedtime. Medication: Mag Maxx Cream Back and restless leg [provider] Taking  Active Spouse/Significant Other  potassium chloride SA (KLOR-CON M) 20 MEQ tablet [id number] No Take 2 tablets (40 mEq total) by mouth daily. Bensimhon, Shaune Pascal, MD Taking Active   spironolactone (ALDACTONE) 25 MG tablet [id number] No Take 1 tablet (25 mg total) by mouth daily. Bensimhon, Shaune Pascal, MD Taking Active   Suvorexant [facility]) 5 MG TABS [id number] No Take 5 mg by mouth at bedtime as needed (insomnia).  Patient not taking: Reported on 05/15/2022   Rip Harbour, NP Not Taking Active   torsemide (DEMADEX) 20 MG tablet [id number] No Take 3 tablets (60 mg total) by mouth daily. Bensimhon, Shaune Pascal, MD Taking Active   warfarin (COUMADIN) 1 MG tablet [id number] No Take 2 tablets to 3 tablets by mouth daily as directed by [facility] Bensimhon, Shaune Pascal, MD Taking Active   zolpidem (AMBIEN) 5 MG tablet [id number]  Take 5 mg by mouth at bedtime as needed. [provider]  Active             Patient Active Problem List   Diagnosis Date Noted   Prediabetes 04/14/2022   Atherosclerosis of aorta ([place]) 04/14/2022   Abnormal esophagram    Stricture and stenosis of esophagus    Acute on chronic systolic heart failure ([place]) 03/15/2022   Acute CHF (congestive heart failure) ([place]) 03/12/2022   Heat syncope 02/24/2022   Contusion of scalp 02/24/2022   Scalp laceration 02/24/2022   Fall on same level 02/24/2022   Need for influenza vaccination 02/24/2022   Microcytic anemia 02/24/2022   Stage 3b chronic kidney disease (CKD) ([place]) 02/24/2022   Long term (current) use of anticoagulants 11/26/2021   Mitral valve replaced 11/26/2021   Esophageal dysphagia  Mitral regurgitation 11/01/2021   S/P mitral valve replacement 11/01/2021   Acute on chronic combined systolic (congestive) and diastolic (congestive) heart failure ([place]) 10/24/2021   Pericardial effusion    Severe mitral regurgitation    NSTEMI (non-ST elevated myocardial infarction) ([place]) 10/10/2021   Non-ST elevation  (NSTEMI) myocardial infarction ([place]) 42/35/3614   Acute systolic heart failure ([place]) 10/10/2021   Hyperlipidemia 10/10/2021    Immunization History  Administered Date(s) Administered   COVID-19, mRNA, vaccine(Comirnaty)12 years and older 04/14/2022   Fluad Quad(high Dose 65+) 05/20/2021, 02/19/2022   PNEUMOCOCCAL CONJUGATE-20 07/09/2021   Pfizer Covid-19 Vaccine Bivalent Booster 62yr & up 05/20/2021   Tdap 01/28/2022    Conditions to be addressed/monitored:  Hypertension, Hyperlipidemia, and Diabetes  Care Plan : CCombs Updates made by KLane Hacker RRed [place] 05/22/2022 12:00 AM     Problem: Disease State Management   Priority: High  Onset Date: 04/24/2022     Long-Range Goal: DM, HF, Lipids   Start Date: 04/24/2022  Expected End Date: 04/24/2023  Recent Progress: On track  Priority: High  Note:   Current Barriers:  Does not contact provider office for questions/concerns  Pharmacist Clinical Goal(s):  Patient will achieve adherence to monitoring guidelines and medication adherence to achieve therapeutic efficacy through collaboration with PharmD and provider.   Interventions: 1:1 collaboration with HRip Harbour NP regarding development and update of comprehensive plan of care as evidenced by provider attestation and co-signature Inter-disciplinary care team collaboration (see longitudinal plan of care) Comprehensive medication review performed; medication list updated in electronic medical record  CAD: (LDL goal < 70) - LHC 05/23: 100% m RCA and 90% m LAD.  - Echo (5/23) EF 25%, RV moderately reduced, moderate to severe MR - s/p CABG x 2 (LIMA-LAD, SVG-[place] INT)  (11/01/21) The ASCVD Risk score (Arnett DK, et al., 2019) failed to calculate for the following reasons:   The patient has a prior MI or stroke diagnosis Lab Results  Component Value Date   CHOL 75 03/18/2022   CHOL 131 01/06/2022   CHOL 194 07/09/2021   Lab Results   Component Value Date   HDL 33 (L) 03/18/2022   HDL 36 (L) 01/06/2022   HDL 55 07/09/2021   Lab Results  Component Value Date   LDLCALC 29 03/18/2022   LDLCALC 79 01/06/2022   LDLCALC 129 (H) 07/09/2021   Lab Results  Component Value Date   TRIG 63 03/18/2022   TRIG 79 01/06/2022   TRIG 55 07/09/2021   Lab Results  Component Value Date   CHOLHDL 2.3 03/18/2022   CHOLHDL 3.6 01/06/2022   CHOLHDL 3.5 07/09/2021  No results found for: "LDLDIRECT" Last vitamin D No results found for: "25OHVITD2", "25OHVITD3", "VD25OH" Lab Results  Component Value Date   TSH 1.049 03/18/2022  -Controlled -Current treatment: Atorvastatin 826mAppropriate, Effective, Safe, Accessible ASA 8194mppropriate, Effective, Safe, Accessible Ezetimibe 41m12mpropriate, Effective, Safe, Accessible -Medications previously tried: N/A  -Current dietary patterns: "Tries to eat healthy" -Current exercise habits: N/A -Educated on Cholesterol goals;  -Recommended to continue current medication  Pre-Diabetes (A1c goal <6.5%) Lab Results  Component Value Date   HGBA1C 6.0 (H) 03/18/2022   HGBA1C 6.0 (H) 11/01/2021   HGBA1C 5.8 (H) 07/09/2021   Lab Results  Component Value Date   LDLCALC 29 03/18/2022   CREATININE 2.15 (H) 05/15/2022   Lab Results  Component Value Date   NA 136 05/15/2022   K 3.6 05/15/2022   CREATININE  2.15 (H) 05/15/2022   EGFR 57 (L) 01/06/2022   GFRNONAA 32 (L) 05/15/2022   GLUCOSE 108 (H) 05/15/2022   Lab Results  Component Value Date   WBC 5.2 05/15/2022   HGB 10.5 (L) 05/15/2022   HCT 33.0 (L) 05/15/2022   MCV 79 05/15/2022   PLT 148 (L) 05/15/2022   No results found for: "LABMICR", "MICROALBUR" -Controlled -Current medications: Dapagliflozin Appropriate, Effective, Safe, Query accessible PAP Started for 2023 on 04/22/22 -Medications previously tried: N/A  -Current home glucose readings fasting glucose: Doesn't test -Denies hypoglycemic/hyperglycemic  symptoms -Educated on A1c and blood sugar goals; -Counseled to check feet daily and get yearly eye exams -November 2023: Start PAP for Dapagliflozin December 2023: Dapagliflozin PAP submitted at beginning of month. Sent msg to [employer] via teams to call company to check on status  Atrial Fibrillation (Goal: prevent stroke and major bleeding) -Controlled -CHADSVASC: 6 -Current treatment: Rhythm control:  Amiodarone 173m QD Appropriate, Effective, Safe, Accessible Digoxin 0.06252mAppropriate, Effective, Safe, Accessible Anticoagulation:  Warfarin UD Appropriate, Effective, Safe, Accessible -Medications previously tried: N/A -Home BP and HR readings: Doesn't test  -Counseled on increased risk of stroke due to Afib and benefits of anticoagulation for stroke prevention; November 2023: Will have CCM team look into potential of a DOAC and see if patient has spent 3% on meds. If so, will contact Cardio  Heart Failure (Goal: manage symptoms and prevent exacerbations) -Controlled -Last ejection fraction:  EF 25-30 % per ECHO, attends HF clinic s/p late presentation inferior STEMI 05/23 Echo (5/23): EF 25-30%, RV moderately reduced, moderate to severe MR. 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 Echo 03/12/22 showed EF 20-25%,  -HF type: HFrEF (EF < 40%) -NYHA Class: IV (significant limitation of activity / dyspnea at rest) -AHA HF Stage: D (Advanced disease requiring aggressive medical therapy) -Current treatment: Dapagliflozin 1045mppropriate, Effective, Safe, Query accessible PAP started on 04/22/22 for 2023 Digoxin 0.625m75mpropriate, Effective, Safe, Accessible [place]ine 10mg46m Appropriate, Effective, Safe, Query accessible $100 at pharmacy KCl Appropriate, Effective, Safe, Accessible Spironolactone 25mg 53mopriate, Effective, Safe, Accessible Torsemide 20mg  37mopriate, Effective, Safe, Accessible -Medications previously tried: N/A  -Current home BP/HR readings: N/A -Current dietary habits: "Tries to eat healthy" -Current exercise habits: N/A -Educated on Benefits of medications for managing symptoms and prolonging life November 2023: Star Farxiga PAP. Called Elizabeth with [employer], [employer] to credit patient $40 (They accidentally messed up charging for patient. Unsure of details). Coordinated with ElizabeCharlann Boxerng to work on a grant for Spironolactone and Digoxin via direct message. Will get a month's worth of samples to last December 2023: Sent [place]owing direct msg, "Howdy! I believe you and I spoke on 04/24/22 about this patient and a grant. I didn't see a note and wanted to check on the status of things. Thank you and I hope you're having a wonderful day!" -Asked CCM team about [employer] to use   COPD (Goal: control symptoms and prevent exacerbations) -Controlled Tobacco Use: Medium Risk (05/15/2022)   Patient History    Smoking Tobacco Use: Former    Smokeless Tobacco Use: Never    Passive Exposure: Not on file    -Current treatment  Albuterol Appropriate, Effective, Safe, Accessible Trelegy Appropriate, Effective, Safe, Query accessible PAP Started for 2023 on 04/22/22 -Medications previously tried: N/A  -Gold Grade: Unknown -Current COPD Classification:  Unknown -MMRC/CAT score:      No data to display        -Pulmonary function testing:  PF Readings from Last 3 Encounters:  No data found for PF  Pulmonary Functions Testing Results: No results found for: "FEV1", "FVC", "FEV1FVC", "TLC", "DLCO"  -Patient denies consistent use of maintenance inhaler -Counseled on Proper inhaler technique; November 2023: Stared PAP for Trelegy December 2023: Dapagliflozin PAP submitted at beginning of month. Sent msg to [employer] via teams to call company to check on  status  Cost of meds: Midrodine: $99.85 -$40 next time. Was credited Trelegy: 174 -PAP started Farxiga: 152 -PAP started Amiodarone: 14.88 Torsemide: 13.75 Warfarin: 10.75 Digoxin: 10.17 KCl: 7.17 Albuterol: 9.49   Patient Goals/Self-Care Activities Patient will:  - take medications as prescribed as evidenced by patient report and record review  Follow Up Plan: The patient has been provided with contact information for the care management team and has been advised to call with any health related questions or concerns.   CPP F/U Jan 2024  Ari[place] Constable, Pharm.D. - [phone]        Medication Assistance:  -See CP  Compliance/Adherence/Medication fill history: Care Gaps: Last Annual Wellness visit? None Last Colonoscopy? None Last Mammogram? None Last Bone Denisty? None AWV never done Shingrix overdue Colonoscopy Covid booster overdue   Last eye exam / retinopathy screening? [facility] this year Last diabetic foot exam? None     Star Rating Drugs: Atorvastatin 80 mg- Last filled 03-28-2022 30 DS. Previous 03-01-2022 30 DS. Farxiga 10 mg- Last filled 04-09-2022 30 DS. Previous 03-11-2022 30 DS  Patient's preferred pharmacy is:  [facility], [place] [address] [place] [address] Phone: [phone] Fax: [phone]  [facility] - [place], [place] - [address] [place] [address] Phone: [phone] Fax: [phone]  Uses pill box? No -   Pt endorses 100% compliance  We discussed: Benefits of medication synchronization, packaging and delivery as well as enhanced pharmacist oversight with [facility]. Patient decided to: Utilize [facility] for medication synchronization, packaging and delivery -Verbal consent obtained for [facility] enhanced pharmacy services (medication synchronization, adherence packaging, delivery  coordination). A medication sync plan was created to allow patient to get all medications delivered once every 30 to 90 days per patient preference. Patient understands they have freedom to choose pharmacy and clinical pharmacist will coordinate care between all prescribers and [facility]. -Patient wants meds delivered to her in person, not left on door. They have trouble paying for gas and would like help  Care Plan and Follow Up Patient Decision:  Patient agrees to Care Plan and Follow-up.  Plan: The patient has been provided with contact information for the care management team and has been advised to call with any health related questions or concerns.   CPP F/U Jan 2024  Ari[place] Constable, Pharm.D. - [phone]

## 2022-05-22 NOTE — Telephone Encounter (Signed)
Returned call and spoke with [name] with [facility]. Gave verbal order to check INR on 06/12/22. Pt was scheduled to come to [facility] in [place] on 06/10/22; however, home health nurse is going to pt's home on 06/12/22. Appt canceled, pt aware.

## 2022-05-22 NOTE — Progress Notes (Addendum)
05-22-2022: Contacted [place]&ME to follow up on farxiga application and it was approved on 05-20-22 through 05-21-23. Medication is still being processed but once processed will ship out in 10-14 days. Contacted RX outreach to follow up on midodrine application and was told application was received and prescription needs to be faxed by PCP. After receiving prescription they will reach out to patient. Sent a message to the pool to fax prescription.  Huey Romans [facility] Clinical Pharmacist Assistant [phone]

## 2022-05-22 NOTE — Patient Instructions (Signed)
Visit Information   Goals Addressed   None    Patient Care Plan: CCM Pharmacy Care Plan     Problem Identified: Disease State Management   Priority: High  Onset Date: 04/24/2022     Long-Range Goal: DM, HF, Lipids   Start Date: 04/24/2022  Expected End Date: 04/24/2023  Recent Progress: On track  Priority: High  Note:   Current Barriers:  Does not contact provider office for questions/concerns  Pharmacist Clinical Goal(s):  Patient will achieve adherence to monitoring guidelines and medication adherence to achieve therapeutic efficacy through collaboration with PharmD and provider.   Interventions: 1:1 collaboration with Rip Harbour, NP regarding development and update of comprehensive plan of care as evidenced by provider attestation and co-signature Inter-disciplinary care team collaboration (see longitudinal plan of care) Comprehensive medication review performed; medication list updated in electronic medical record  CAD: (LDL goal < 70) - LHC 05/23: 100% m RCA and 90% m LAD.  - Echo (5/23) EF 25%, RV moderately reduced, moderate to severe MR - s/p CABG x 2 (LIMA-LAD, SVG-Ramus INT)  (11/01/21) The ASCVD Risk score (Arnett DK, et al., 2019) failed to calculate for the following reasons:   The patient has a prior MI or stroke diagnosis Lab Results  Component Value Date   CHOL 75 03/18/2022   CHOL 131 01/06/2022   CHOL 194 07/09/2021   Lab Results  Component Value Date   HDL 33 (L) 03/18/2022   HDL 36 (L) 01/06/2022   HDL 55 07/09/2021   Lab Results  Component Value Date   LDLCALC 29 03/18/2022   LDLCALC 79 01/06/2022   LDLCALC 129 (H) 07/09/2021   Lab Results  Component Value Date   TRIG 63 03/18/2022   TRIG 79 01/06/2022   TRIG 55 07/09/2021   Lab Results  Component Value Date   CHOLHDL 2.3 03/18/2022   CHOLHDL 3.6 01/06/2022   CHOLHDL 3.5 07/09/2021  No results found for: "LDLDIRECT" Last vitamin D No results found for: "25OHVITD2",  "25OHVITD3", "VD25OH" Lab Results  Component Value Date   TSH 1.049 03/18/2022  -Controlled -Current treatment: Atorvastatin 37m Appropriate, Effective, Safe, Accessible ASA 870mAppropriate, Effective, Safe, Accessible Ezetimibe 1075mppropriate, Effective, Safe, Accessible -Medications previously tried: N/A  -Current dietary patterns: "Tries to eat healthy" -Current exercise habits: N/A -Educated on Cholesterol goals;  -Recommended to continue current medication  Pre-Diabetes (A1c goal <6.5%) Lab Results  Component Value Date   HGBA1C 6.0 (H) 03/18/2022   HGBA1C 6.0 (H) 11/01/2021   HGBA1C 5.8 (H) 07/09/2021   Lab Results  Component Value Date   LDLCALC 29 03/18/2022   CREATININE 2.15 (H) 05/15/2022   Lab Results  Component Value Date   NA 136 05/15/2022   K 3.6 05/15/2022   CREATININE 2.15 (H) 05/15/2022   EGFR 57 (L) 01/06/2022   GFRNONAA 32 (L) 05/15/2022   GLUCOSE 108 (H) 05/15/2022   Lab Results  Component Value Date   WBC 5.2 05/15/2022   HGB 10.5 (L) 05/15/2022   HCT 33.0 (L) 05/15/2022   MCV 79 05/15/2022   PLT 148 (L) 05/15/2022   No results found for: "LABMICR", "MICROALBUR" -Controlled -Current medications: Dapagliflozin Appropriate, Effective, Safe, Query accessible PAP Started for 2023 on 04/22/22 -Medications previously tried: N/A  -Current home glucose readings fasting glucose: Doesn't test -Denies hypoglycemic/hyperglycemic symptoms -Educated on A1c and blood sugar goals; -Counseled to check feet daily and get yearly eye exams -November 2023: Start PAP for Dapagliflozin December 2023: Dapagliflozin PAP submitted at  beginning of month. Sent msg to [employer] via teams to call company to check on status  Atrial Fibrillation (Goal: prevent stroke and major bleeding) -Controlled -CHADSVASC: 6 -Current treatment: Rhythm control:  Amiodarone 186m QD Appropriate, Effective, Safe, Accessible Digoxin 0.06239mAppropriate, Effective, Safe,  Accessible Anticoagulation:  Warfarin UD Appropriate, Effective, Safe, Accessible -Medications previously tried: N/A -Home BP and HR readings: Doesn't test  -Counseled on increased risk of stroke due to Afib and benefits of anticoagulation for stroke prevention; November 2023: Will have CCM team look into potential of a DOAC and see if patient has spent 3% on meds. If so, will contact Cardio  Heart Failure (Goal: manage symptoms and prevent exacerbations) -Controlled -Last ejection fraction:  EF 25-30 % per ECHO, attends HF clinic s/p late presentation inferior STEMI 05/23 Echo (5/23): EF 25-30%, RV moderately reduced, moderate to severe MR. 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 Echo 03/12/22 showed EF 20-25%,  -HF type: HFrEF (EF < 40%) -NYHA Class: IV (significant limitation of activity / dyspnea at rest) -AHA HF Stage: D (Advanced disease requiring aggressive medical therapy) -Current treatment: Dapagliflozin 1062mppropriate, Effective, Safe, Query accessible PAP started on 04/22/22 for 2023 Digoxin 0.625m53mpropriate, Effective, Safe, Accessible Midrodine 10mg36m Appropriate, Effective, Safe, Query accessible $100 at pharmacy KCl Appropriate, Effective, Safe, Accessible Spironolactone 25mg 91mopriate, Effective, Safe, Accessible Torsemide 20mg A1mpriate, Effective, Safe, Accessible -Medications previously tried: N/A  -Current home BP/HR readings: N/A -Current dietary habits: "Tries to eat healthy" -Current exercise habits: N/A -Educated on Benefits of medications for managing symptoms and prolonging life November 2023: Star Farxiga PAP. Called [place] with [employer] to credit patient $40 (They accidentally messed up charging for patient. Unsure of details). Coordinated with ElizabeCharlann Boxerng to work on a grant for  Spironolactone and Digoxin via direct message. Will get a month's worth of samples to last December 2023: Sent ElAikenllowing direct msg, "Howdy! I believe you and I spoke on 04/24/22 about this patient and a grant. I didn't see a note and wanted to check on the status of things. Thank you and I hope you're having a wonderful day!" -Asked CCM team about [employer] to use   COPD (Goal: control symptoms and prevent exacerbations) -Controlled Tobacco Use: Medium Risk (05/15/2022)   Patient History    Smoking Tobacco Use: Former    Smokeless Tobacco Use: Never    Passive Exposure: Not on file    -Current treatment  Albuterol Appropriate, Effective, Safe, Accessible Trelegy Appropriate, Effective, Safe, Query accessible PAP Started for 2023 on 04/22/22 -Medications previously tried: N/A  -Gold Grade: Unknown -Current COPD Classification:  Unknown -MMRC/CAT score:      No data to display        -Pulmonary function testing:  PF Readings from Last 3 Encounters:  No data found for PF  Pulmonary Functions Testing Results: No results found for: "FEV1", "FVC", "FEV1FVC", "TLC", "DLCO"  -Patient denies consistent use of maintenance inhaler -Counseled on Proper inhaler technique; November 2023: Stared PAP for Trelegy December 2023: Dapagliflozin PAP submitted at beginning of month. Sent msg to [employer] to call company to check on status  Cost of meds: Midrodine: $99.85 -$40 next time. Was credited Trelegy: 174 -PAP started Farxiga: 152 -PAP started Amiodarone: 14.88 Torsemide: 13.75 Warfarin: 10.75 Digoxin: 10.17 KCl: 7.17 Albuterol: 9.49   Patient  Goals/Self-Care Activities Patient will:  - take medications as prescribed as evidenced by patient report and record review  Follow Up Plan: The patient has been provided with contact information for the care management team and has been advised to call with any health related questions or  concerns.   CPP F/U Jan 2024  Arizona Constable, Pharm.D. - [phone]       Keith Jenkins was given information about Chronic Care Management services today including:  CCM service includes personalized support from designated clinical staff supervised by his physician, including individualized plan of care and coordination with other care providers 24/7 contact phone numbers for assistance for urgent and routine care needs. Standard insurance, coinsurance, copays and deductibles apply for chronic care management only during months in which we provide at least 20 minutes of these services. Most insurances cover these services at 100%, however patients may be responsible for any copay, coinsurance and/or deductible if applicable. This service may help you avoid the need for more expensive face-to-face services. Only one practitioner may furnish and bill the service in a calendar month. The patient may stop CCM services at any time (effective at the end of the month) by phone call to the office staff.  Patient agreed to services and verbal consent obtained.   The patient verbalized understanding of instructions, educational materials, and care plan provided today and DECLINED offer to receive copy of patient instructions, educational materials, and care plan.  The pharmacy team will reach out to the patient again over the next 30 days.   Lane Hacker, [place]

## 2022-05-23 DIAGNOSIS — N1832 Chronic kidney disease, stage 3b: Secondary | ICD-10-CM | POA: Diagnosis not present

## 2022-05-23 DIAGNOSIS — E785 Hyperlipidemia, unspecified: Secondary | ICD-10-CM | POA: Diagnosis not present

## 2022-05-23 DIAGNOSIS — R1319 Other dysphagia: Secondary | ICD-10-CM | POA: Diagnosis not present

## 2022-05-23 DIAGNOSIS — I350 Nonrheumatic aortic (valve) stenosis: Secondary | ICD-10-CM | POA: Diagnosis not present

## 2022-05-23 DIAGNOSIS — I480 Paroxysmal atrial fibrillation: Secondary | ICD-10-CM | POA: Diagnosis not present

## 2022-05-23 DIAGNOSIS — K222 Esophageal obstruction: Secondary | ICD-10-CM | POA: Diagnosis not present

## 2022-05-23 DIAGNOSIS — D509 Iron deficiency anemia, unspecified: Secondary | ICD-10-CM | POA: Diagnosis not present

## 2022-05-23 DIAGNOSIS — K219 Gastro-esophageal reflux disease without esophagitis: Secondary | ICD-10-CM | POA: Diagnosis not present

## 2022-05-23 DIAGNOSIS — Z7901 Long term (current) use of anticoagulants: Secondary | ICD-10-CM | POA: Diagnosis not present

## 2022-05-23 DIAGNOSIS — J449 Chronic obstructive pulmonary disease, unspecified: Secondary | ICD-10-CM | POA: Diagnosis not present

## 2022-05-23 DIAGNOSIS — M1990 Unspecified osteoarthritis, unspecified site: Secondary | ICD-10-CM | POA: Diagnosis not present

## 2022-05-23 DIAGNOSIS — Z954 Presence of other heart-valve replacement: Secondary | ICD-10-CM | POA: Diagnosis not present

## 2022-05-23 DIAGNOSIS — Z9181 History of falling: Secondary | ICD-10-CM | POA: Diagnosis not present

## 2022-05-23 DIAGNOSIS — I252 Old myocardial infarction: Secondary | ICD-10-CM | POA: Diagnosis not present

## 2022-05-23 DIAGNOSIS — H409 Unspecified glaucoma: Secondary | ICD-10-CM | POA: Diagnosis not present

## 2022-05-23 DIAGNOSIS — Z87891 Personal history of nicotine dependence: Secondary | ICD-10-CM | POA: Diagnosis not present

## 2022-05-23 DIAGNOSIS — I5023 Acute on chronic systolic (congestive) heart failure: Secondary | ICD-10-CM | POA: Diagnosis not present

## 2022-05-23 DIAGNOSIS — Z7982 Long term (current) use of aspirin: Secondary | ICD-10-CM | POA: Diagnosis not present

## 2022-05-23 DIAGNOSIS — Z951 Presence of aortocoronary bypass graft: Secondary | ICD-10-CM | POA: Diagnosis not present

## 2022-05-23 DIAGNOSIS — I2510 Atherosclerotic heart disease of native coronary artery without angina pectoris: Secondary | ICD-10-CM | POA: Diagnosis not present

## 2022-05-26 ENCOUNTER — Other Ambulatory Visit: Payer: Self-pay

## 2022-05-26 DIAGNOSIS — I5022 Chronic systolic (congestive) heart failure: Secondary | ICD-10-CM | POA: Diagnosis not present

## 2022-05-26 MED ORDER — MIDODRINE HCL 10 MG PO TABS: 10.0000 mg | ORAL_TABLET | Freq: Three times a day (TID) | ORAL | 6 refills | Status: DC

## 2022-05-27 LAB — BASIC METABOLIC PANEL
BUN/Creatinine Ratio: 19 (ref 10–24)
BUN: 42 mg/dL — ABNORMAL HIGH (ref 8–27)
CO2: 22 mmol/L (ref 20–29)
Calcium: 8.8 mg/dL (ref 8.6–10.2)
Chloride: 94 mmol/L — ABNORMAL LOW (ref 96–106)
Creatinine, Ser: 2.17 mg/dL — ABNORMAL HIGH (ref 0.76–1.27)
Glucose: 96 mg/dL (ref 70–99)
Potassium: 4 mmol/L (ref 3.5–5.2)
Sodium: 133 mmol/L — ABNORMAL LOW (ref 134–144)
eGFR: 31 mL/min/{1.73_m2} — ABNORMAL LOW (ref >59)

## 2022-05-27 LAB — DIGOXIN LEVEL: Digoxin, Serum: 0.7 ng/mL (ref 0.5–0.9)

## 2022-05-27 LAB — BRAIN NATRIURETIC PEPTIDE: BNP: 1785.8 pg/mL — ABNORMAL HIGH (ref 0.0–100.0)

## 2022-05-28 ENCOUNTER — Telehealth: Payer: Self-pay

## 2022-05-28 NOTE — Chronic Care Management (AMB) (Signed)
AZ&Me Notification:  Patient Approved 05/20/2022 - 06/09/2023 for Keith Jenkins.    Billee Cashing, CMA Clinical Pharmacist Assistant [phone]

## 2022-05-29 ENCOUNTER — Other Ambulatory Visit (HOSPITAL_COMMUNITY): Payer: Self-pay | Admitting: Internal Medicine

## 2022-05-29 DIAGNOSIS — D509 Iron deficiency anemia, unspecified: Secondary | ICD-10-CM | POA: Diagnosis not present

## 2022-05-29 DIAGNOSIS — H409 Unspecified glaucoma: Secondary | ICD-10-CM | POA: Diagnosis not present

## 2022-05-29 DIAGNOSIS — R1319 Other dysphagia: Secondary | ICD-10-CM | POA: Diagnosis not present

## 2022-05-29 DIAGNOSIS — Z7901 Long term (current) use of anticoagulants: Secondary | ICD-10-CM | POA: Diagnosis not present

## 2022-05-29 DIAGNOSIS — K219 Gastro-esophageal reflux disease without esophagitis: Secondary | ICD-10-CM | POA: Diagnosis not present

## 2022-05-29 DIAGNOSIS — I252 Old myocardial infarction: Secondary | ICD-10-CM | POA: Diagnosis not present

## 2022-05-29 DIAGNOSIS — I5023 Acute on chronic systolic (congestive) heart failure: Secondary | ICD-10-CM | POA: Diagnosis not present

## 2022-05-29 DIAGNOSIS — I350 Nonrheumatic aortic (valve) stenosis: Secondary | ICD-10-CM | POA: Diagnosis not present

## 2022-05-29 DIAGNOSIS — E785 Hyperlipidemia, unspecified: Secondary | ICD-10-CM | POA: Diagnosis not present

## 2022-05-29 DIAGNOSIS — Z9181 History of falling: Secondary | ICD-10-CM | POA: Diagnosis not present

## 2022-05-29 DIAGNOSIS — K222 Esophageal obstruction: Secondary | ICD-10-CM | POA: Diagnosis not present

## 2022-05-29 DIAGNOSIS — Z87891 Personal history of nicotine dependence: Secondary | ICD-10-CM | POA: Diagnosis not present

## 2022-05-29 DIAGNOSIS — I480 Paroxysmal atrial fibrillation: Secondary | ICD-10-CM | POA: Diagnosis not present

## 2022-05-29 DIAGNOSIS — N1832 Chronic kidney disease, stage 3b: Secondary | ICD-10-CM | POA: Diagnosis not present

## 2022-05-29 DIAGNOSIS — J449 Chronic obstructive pulmonary disease, unspecified: Secondary | ICD-10-CM | POA: Diagnosis not present

## 2022-05-29 DIAGNOSIS — Z951 Presence of aortocoronary bypass graft: Secondary | ICD-10-CM | POA: Diagnosis not present

## 2022-05-29 DIAGNOSIS — I2510 Atherosclerotic heart disease of native coronary artery without angina pectoris: Secondary | ICD-10-CM | POA: Diagnosis not present

## 2022-05-29 DIAGNOSIS — Z954 Presence of other heart-valve replacement: Secondary | ICD-10-CM | POA: Diagnosis not present

## 2022-05-29 DIAGNOSIS — M1990 Unspecified osteoarthritis, unspecified site: Secondary | ICD-10-CM | POA: Diagnosis not present

## 2022-05-29 DIAGNOSIS — Z7982 Long term (current) use of aspirin: Secondary | ICD-10-CM | POA: Diagnosis not present

## 2022-05-30 DIAGNOSIS — K219 Gastro-esophageal reflux disease without esophagitis: Secondary | ICD-10-CM | POA: Diagnosis not present

## 2022-05-30 DIAGNOSIS — E785 Hyperlipidemia, unspecified: Secondary | ICD-10-CM | POA: Diagnosis not present

## 2022-05-30 DIAGNOSIS — I2510 Atherosclerotic heart disease of native coronary artery without angina pectoris: Secondary | ICD-10-CM | POA: Diagnosis not present

## 2022-05-30 DIAGNOSIS — I480 Paroxysmal atrial fibrillation: Secondary | ICD-10-CM | POA: Diagnosis not present

## 2022-05-30 DIAGNOSIS — I350 Nonrheumatic aortic (valve) stenosis: Secondary | ICD-10-CM | POA: Diagnosis not present

## 2022-05-30 DIAGNOSIS — I5023 Acute on chronic systolic (congestive) heart failure: Secondary | ICD-10-CM | POA: Diagnosis not present

## 2022-05-30 DIAGNOSIS — D509 Iron deficiency anemia, unspecified: Secondary | ICD-10-CM | POA: Diagnosis not present

## 2022-05-30 DIAGNOSIS — M1990 Unspecified osteoarthritis, unspecified site: Secondary | ICD-10-CM | POA: Diagnosis not present

## 2022-05-30 DIAGNOSIS — J449 Chronic obstructive pulmonary disease, unspecified: Secondary | ICD-10-CM | POA: Diagnosis not present

## 2022-05-30 DIAGNOSIS — Z9181 History of falling: Secondary | ICD-10-CM | POA: Diagnosis not present

## 2022-05-30 DIAGNOSIS — H409 Unspecified glaucoma: Secondary | ICD-10-CM | POA: Diagnosis not present

## 2022-05-30 DIAGNOSIS — N1832 Chronic kidney disease, stage 3b: Secondary | ICD-10-CM | POA: Diagnosis not present

## 2022-05-30 DIAGNOSIS — Z7901 Long term (current) use of anticoagulants: Secondary | ICD-10-CM | POA: Diagnosis not present

## 2022-05-30 DIAGNOSIS — Z951 Presence of aortocoronary bypass graft: Secondary | ICD-10-CM | POA: Diagnosis not present

## 2022-05-30 DIAGNOSIS — I252 Old myocardial infarction: Secondary | ICD-10-CM | POA: Diagnosis not present

## 2022-05-30 DIAGNOSIS — Z954 Presence of other heart-valve replacement: Secondary | ICD-10-CM | POA: Diagnosis not present

## 2022-05-30 DIAGNOSIS — K222 Esophageal obstruction: Secondary | ICD-10-CM | POA: Diagnosis not present

## 2022-05-30 DIAGNOSIS — Z87891 Personal history of nicotine dependence: Secondary | ICD-10-CM | POA: Diagnosis not present

## 2022-05-30 DIAGNOSIS — R1319 Other dysphagia: Secondary | ICD-10-CM | POA: Diagnosis not present

## 2022-05-30 DIAGNOSIS — Z7982 Long term (current) use of aspirin: Secondary | ICD-10-CM | POA: Diagnosis not present

## 2022-06-05 DIAGNOSIS — Z951 Presence of aortocoronary bypass graft: Secondary | ICD-10-CM | POA: Diagnosis not present

## 2022-06-05 DIAGNOSIS — I480 Paroxysmal atrial fibrillation: Secondary | ICD-10-CM | POA: Diagnosis not present

## 2022-06-05 DIAGNOSIS — I350 Nonrheumatic aortic (valve) stenosis: Secondary | ICD-10-CM | POA: Diagnosis not present

## 2022-06-05 DIAGNOSIS — Z9181 History of falling: Secondary | ICD-10-CM | POA: Diagnosis not present

## 2022-06-05 DIAGNOSIS — J449 Chronic obstructive pulmonary disease, unspecified: Secondary | ICD-10-CM | POA: Diagnosis not present

## 2022-06-05 DIAGNOSIS — M1990 Unspecified osteoarthritis, unspecified site: Secondary | ICD-10-CM | POA: Diagnosis not present

## 2022-06-05 DIAGNOSIS — N1832 Chronic kidney disease, stage 3b: Secondary | ICD-10-CM | POA: Diagnosis not present

## 2022-06-05 DIAGNOSIS — K222 Esophageal obstruction: Secondary | ICD-10-CM | POA: Diagnosis not present

## 2022-06-05 DIAGNOSIS — Z87891 Personal history of nicotine dependence: Secondary | ICD-10-CM | POA: Diagnosis not present

## 2022-06-05 DIAGNOSIS — I252 Old myocardial infarction: Secondary | ICD-10-CM | POA: Diagnosis not present

## 2022-06-05 DIAGNOSIS — Z7982 Long term (current) use of aspirin: Secondary | ICD-10-CM | POA: Diagnosis not present

## 2022-06-05 DIAGNOSIS — R1319 Other dysphagia: Secondary | ICD-10-CM | POA: Diagnosis not present

## 2022-06-05 DIAGNOSIS — Z954 Presence of other heart-valve replacement: Secondary | ICD-10-CM | POA: Diagnosis not present

## 2022-06-05 DIAGNOSIS — I2510 Atherosclerotic heart disease of native coronary artery without angina pectoris: Secondary | ICD-10-CM | POA: Diagnosis not present

## 2022-06-05 DIAGNOSIS — E785 Hyperlipidemia, unspecified: Secondary | ICD-10-CM | POA: Diagnosis not present

## 2022-06-05 DIAGNOSIS — I5023 Acute on chronic systolic (congestive) heart failure: Secondary | ICD-10-CM | POA: Diagnosis not present

## 2022-06-05 DIAGNOSIS — Z7901 Long term (current) use of anticoagulants: Secondary | ICD-10-CM | POA: Diagnosis not present

## 2022-06-05 DIAGNOSIS — K219 Gastro-esophageal reflux disease without esophagitis: Secondary | ICD-10-CM | POA: Diagnosis not present

## 2022-06-05 DIAGNOSIS — D509 Iron deficiency anemia, unspecified: Secondary | ICD-10-CM | POA: Diagnosis not present

## 2022-06-05 DIAGNOSIS — H409 Unspecified glaucoma: Secondary | ICD-10-CM | POA: Diagnosis not present

## 2022-06-06 DIAGNOSIS — Z9181 History of falling: Secondary | ICD-10-CM | POA: Diagnosis not present

## 2022-06-06 DIAGNOSIS — R1319 Other dysphagia: Secondary | ICD-10-CM | POA: Diagnosis not present

## 2022-06-06 DIAGNOSIS — I480 Paroxysmal atrial fibrillation: Secondary | ICD-10-CM | POA: Diagnosis not present

## 2022-06-06 DIAGNOSIS — I2510 Atherosclerotic heart disease of native coronary artery without angina pectoris: Secondary | ICD-10-CM | POA: Diagnosis not present

## 2022-06-06 DIAGNOSIS — M1990 Unspecified osteoarthritis, unspecified site: Secondary | ICD-10-CM | POA: Diagnosis not present

## 2022-06-06 DIAGNOSIS — I252 Old myocardial infarction: Secondary | ICD-10-CM | POA: Diagnosis not present

## 2022-06-06 DIAGNOSIS — N1832 Chronic kidney disease, stage 3b: Secondary | ICD-10-CM | POA: Diagnosis not present

## 2022-06-06 DIAGNOSIS — Z951 Presence of aortocoronary bypass graft: Secondary | ICD-10-CM | POA: Diagnosis not present

## 2022-06-06 DIAGNOSIS — I5023 Acute on chronic systolic (congestive) heart failure: Secondary | ICD-10-CM | POA: Diagnosis not present

## 2022-06-06 DIAGNOSIS — Z87891 Personal history of nicotine dependence: Secondary | ICD-10-CM | POA: Diagnosis not present

## 2022-06-06 DIAGNOSIS — Z954 Presence of other heart-valve replacement: Secondary | ICD-10-CM | POA: Diagnosis not present

## 2022-06-06 DIAGNOSIS — D509 Iron deficiency anemia, unspecified: Secondary | ICD-10-CM | POA: Diagnosis not present

## 2022-06-06 DIAGNOSIS — E785 Hyperlipidemia, unspecified: Secondary | ICD-10-CM | POA: Diagnosis not present

## 2022-06-06 DIAGNOSIS — K222 Esophageal obstruction: Secondary | ICD-10-CM | POA: Diagnosis not present

## 2022-06-06 DIAGNOSIS — Z7982 Long term (current) use of aspirin: Secondary | ICD-10-CM | POA: Diagnosis not present

## 2022-06-06 DIAGNOSIS — I350 Nonrheumatic aortic (valve) stenosis: Secondary | ICD-10-CM | POA: Diagnosis not present

## 2022-06-06 DIAGNOSIS — H409 Unspecified glaucoma: Secondary | ICD-10-CM | POA: Diagnosis not present

## 2022-06-06 DIAGNOSIS — J449 Chronic obstructive pulmonary disease, unspecified: Secondary | ICD-10-CM | POA: Diagnosis not present

## 2022-06-06 DIAGNOSIS — Z7901 Long term (current) use of anticoagulants: Secondary | ICD-10-CM | POA: Diagnosis not present

## 2022-06-06 DIAGNOSIS — K219 Gastro-esophageal reflux disease without esophagitis: Secondary | ICD-10-CM | POA: Diagnosis not present

## 2022-06-08 DIAGNOSIS — I10 Essential (primary) hypertension: Secondary | ICD-10-CM

## 2022-06-08 DIAGNOSIS — I5022 Chronic systolic (congestive) heart failure: Secondary | ICD-10-CM

## 2022-06-08 DIAGNOSIS — E782 Mixed hyperlipidemia: Secondary | ICD-10-CM

## 2022-06-09 ENCOUNTER — Encounter: Payer: Self-pay | Admitting: Nurse Practitioner

## 2022-06-10 ENCOUNTER — Encounter (HOSPITAL_COMMUNITY): Payer: Self-pay | Admitting: Internal Medicine

## 2022-06-11 ENCOUNTER — Encounter: Payer: Self-pay | Admitting: Nurse Practitioner

## 2022-06-11 ENCOUNTER — Ambulatory Visit (INDEPENDENT_AMBULATORY_CARE_PROVIDER_SITE_OTHER): Payer: Medicare HMO | Admitting: Nurse Practitioner

## 2022-06-11 VITALS — BP 140/90 | HR 74 | Temp 97.7°F | Resp 14 | Ht 74.0 in | Wt 205.0 lb

## 2022-06-11 DIAGNOSIS — R296 Repeated falls: Secondary | ICD-10-CM | POA: Diagnosis not present

## 2022-06-11 DIAGNOSIS — I5043 Acute on chronic combined systolic (congestive) and diastolic (congestive) heart failure: Secondary | ICD-10-CM | POA: Diagnosis not present

## 2022-06-11 DIAGNOSIS — M25512 Pain in left shoulder: Secondary | ICD-10-CM

## 2022-06-11 DIAGNOSIS — Z7901 Long term (current) use of anticoagulants: Secondary | ICD-10-CM

## 2022-06-11 DIAGNOSIS — M65341 Trigger finger, right ring finger: Secondary | ICD-10-CM

## 2022-06-11 DIAGNOSIS — G8929 Other chronic pain: Secondary | ICD-10-CM | POA: Diagnosis not present

## 2022-06-11 NOTE — Patient Instructions (Addendum)
We will call you with lab results and orthopedic referral Return for labs next week Continue medications   Trigger Finger  Trigger finger, also called stenosing tenosynovitis,  is a condition that causes a finger to get stuck in a bent position. Each finger has a tendon, which is a tough, cord-like tissue that connects muscle to bone, and each tendon passes through a tunnel of tissue called a tendon sheath. To move your finger, your tendon needs to glide freely through the sheath. Trigger finger happens when the tendon or the sheath thickens, making it difficult to move your finger. Trigger finger can affect any finger or a thumb. It may affect more than one finger. Mild cases may clear up with rest and medicine. Severe cases require more treatment. What are the causes? Trigger finger is caused by a thickened finger tendon or tendon sheath. The cause of this thickening is not known. What increases the risk? The following factors may make you more likely to develop this condition: Doing activities that require a strong grip. Having rheumatoid arthritis, gout, or diabetes. Being 20-71 years old. Being male. What are the signs or symptoms? Symptoms of this condition include: Pain when bending or straightening your finger. Tenderness or swelling where your finger attaches to the palm of your hand. A lump in the palm of your hand or on the inside of your finger. Hearing a noise like a pop or a snap when you try to straighten your finger. Feeling a catching or locking sensation when you try to straighten your finger. Being unable to straighten your finger. How is this diagnosed? This condition is diagnosed based on your symptoms and a physical exam. How is this treated? This condition may be treated by: Resting your finger and avoiding activities that make symptoms worse. Wearing a finger splint to keep your finger extended. Taking NSAIDs, such as ibuprofen, to relieve pain and  swelling. Doing gentle exercises to stretch the finger as told by your health care provider. Having medicine that reduces swelling and inflammation (steroids) injected into the tendon sheath. Injections may need to be repeated. Having surgery to open the tendon sheath. This may be done if other treatments do not work and you cannot straighten your finger. You may need physical therapy after surgery. Follow these instructions at home: If you have a splint: Wear the splint as told by your health care provider. Remove it only as told by your health care provider. Loosen it if your fingers tingle, become numb, or turn cold and blue. Keep it clean. If the splint is not waterproof: Do not let it get wet. Cover it with a watertight covering when you take a bath or shower. Managing pain, stiffness, and swelling     If directed, apply heat to the affected area as often as told by your health care provider. Use the heat source that your health care provider recommends, such as a moist heat pack or a heating pad. Place a towel between your skin and the heat source. Leave the heat on for 20-30 minutes. Remove the heat if your skin turns bright red. This is especially important if you are unable to feel pain, heat, or cold. You may have a greater risk of getting burned. If directed, put ice on the painful area. To do this: If you have a removable splint, remove it as told by your health care provider. Put ice in a plastic bag. Place a towel between your skin and the bag or between  your splint and the bag. Leave the ice on for 20 minutes, 2-3 times a day.  Activity Rest your finger as told by your health care provider. Avoid activities that make the pain worse. Return to your normal activities as told by your health care provider. Ask your health care provider what activities are safe for you. Do exercises as told by your health care provider. Ask your health care provider when it is safe to drive if  you have a splint on your hand. General instructions Take over-the-counter and prescription medicines only as told by your health care provider. Keep all follow-up visits as told by your health care provider. This is important. Contact a health care provider if: Your symptoms are not improving with home care. Summary Trigger finger, also called stenosing tenosynovitis, causes your finger to get stuck in a bent position. This can make it difficult and painful to straighten your finger. This condition develops when a finger tendon or tendon sheath thickens. Treatment may include resting your finger, wearing a splint, and taking medicines. In severe cases, surgery to open the tendon sheath may be needed. This information is not intended to replace advice given to you by your health care provider. Make sure you discuss any questions you have with your health care provider. Document Revised: 10/11/2018 Document Reviewed: 10/11/2018 Elsevier Patient Education  [place].   Restless Legs Syndrome Restless legs syndrome is a condition that causes uncomfortable feelings or sensations in the legs, especially while sitting or lying down. The sensations usually cause an overwhelming urge to move the legs. The arms can also sometimes be affected. The condition can range from mild to severe. The symptoms often interfere with a person's ability to sleep. What are the causes? The cause of this condition is not known. What increases the risk? The following factors may make you more likely to develop this condition: Being older than 50. Pregnancy. Being a woman. In general, the condition is more common in women than in men. A family history of the condition. Having iron deficiency. Overuse of caffeine, nicotine, or alcohol. Certain medical conditions, such as kidney disease, Parkinson's disease, or nerve damage. Certain medicines, such as those for high blood pressure, nausea, colds, allergies,  depression, and some heart conditions. What are the signs or symptoms? The main symptom of this condition is uncomfortable sensations in the legs, such as: Pulling. Tingling. Prickling. Throbbing. Crawling. Burning. Usually, the sensations: Affect both sides of the body. Are worse when you sit or lie down. Are worse at night. These may make it difficult to fall asleep. Make you have a strong urge to move your legs. Are temporarily relieved by moving your legs or standing. The arms can also be affected, but this is rare. People who have this condition often have tiredness during the day because of their lack of sleep at night. How is this diagnosed? This condition may be diagnosed based on: Your symptoms. Blood tests. In some cases, you may be monitored in a sleep lab by a specialist (a sleep study). This can detect any disruptions in your sleep. How is this treated? This condition is treated by managing the symptoms. This may include: Lifestyle changes, such as exercising, using relaxation techniques, and avoiding caffeine, alcohol, or tobacco. Iron supplements. Medicines. Parkinson's medications may be tried first. Anti-seizure medications can also be helpful. Follow these instructions at home: General instructions Take over-the-counter and prescription medicines only as told by your health care provider. Use methods to  help relieve the uncomfortable sensations, such as: Massaging your legs. Walking or stretching. Taking a cold or hot bath. Keep all follow-up visits. This is important. Lifestyle     Practice good sleep habits. For example, go to bed and get up at the same time every day. Most adults should get 7-9 hours of sleep each night. Exercise regularly. Try to get at least 30 minutes of exercise most days of the week. Practice ways of relaxing, such as yoga or meditation. Avoid caffeine and alcohol. Do not use any products that contain nicotine or tobacco. These  products include cigarettes, chewing tobacco, and vaping devices, such as e-cigarettes. If you need help quitting, ask your health care provider. Where to find more information [employer] of Neurological Disorders and Stroke: [employer] Contact a health care provider if: Your symptoms get worse or they do not improve with treatment. Summary Restless legs syndrome is a condition that causes uncomfortable feelings or sensations in the legs, especially while sitting or lying down. The symptoms often interfere with your ability to sleep. This condition is treated by managing the symptoms. You may need to make lifestyle changes or take medicines. This information is not intended to replace advice given to you by your health care provider. Make sure you discuss any questions you have with your health care provider. Document Revised: 01/06/2021 Document Reviewed: 01/06/2021 Elsevier Patient Education  [place].
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
## 2022-06-12 ENCOUNTER — Telehealth (HOSPITAL_COMMUNITY): Payer: Self-pay | Admitting: Cardiology

## 2022-06-12 ENCOUNTER — Ambulatory Visit (INDEPENDENT_AMBULATORY_CARE_PROVIDER_SITE_OTHER): Payer: Medicare HMO | Admitting: *Deleted

## 2022-06-12 ENCOUNTER — Telehealth: Payer: Self-pay | Admitting: Nurse Practitioner

## 2022-06-12 ENCOUNTER — Telehealth: Payer: Self-pay | Admitting: *Deleted

## 2022-06-12 ENCOUNTER — Encounter: Payer: Self-pay | Admitting: Internal Medicine

## 2022-06-12 DIAGNOSIS — J449 Chronic obstructive pulmonary disease, unspecified: Secondary | ICD-10-CM | POA: Diagnosis not present

## 2022-06-12 DIAGNOSIS — R1319 Other dysphagia: Secondary | ICD-10-CM | POA: Diagnosis not present

## 2022-06-12 DIAGNOSIS — K219 Gastro-esophageal reflux disease without esophagitis: Secondary | ICD-10-CM | POA: Diagnosis not present

## 2022-06-12 DIAGNOSIS — Z952 Presence of prosthetic heart valve: Secondary | ICD-10-CM | POA: Diagnosis not present

## 2022-06-12 DIAGNOSIS — I252 Old myocardial infarction: Secondary | ICD-10-CM | POA: Diagnosis not present

## 2022-06-12 DIAGNOSIS — Z7901 Long term (current) use of anticoagulants: Secondary | ICD-10-CM

## 2022-06-12 DIAGNOSIS — Z954 Presence of other heart-valve replacement: Secondary | ICD-10-CM | POA: Diagnosis not present

## 2022-06-12 DIAGNOSIS — Z9181 History of falling: Secondary | ICD-10-CM | POA: Diagnosis not present

## 2022-06-12 DIAGNOSIS — K222 Esophageal obstruction: Secondary | ICD-10-CM | POA: Diagnosis not present

## 2022-06-12 DIAGNOSIS — Z7982 Long term (current) use of aspirin: Secondary | ICD-10-CM | POA: Diagnosis not present

## 2022-06-12 DIAGNOSIS — E785 Hyperlipidemia, unspecified: Secondary | ICD-10-CM | POA: Diagnosis not present

## 2022-06-12 DIAGNOSIS — I480 Paroxysmal atrial fibrillation: Secondary | ICD-10-CM | POA: Diagnosis not present

## 2022-06-12 DIAGNOSIS — M1990 Unspecified osteoarthritis, unspecified site: Secondary | ICD-10-CM | POA: Diagnosis not present

## 2022-06-12 DIAGNOSIS — Z87891 Personal history of nicotine dependence: Secondary | ICD-10-CM | POA: Diagnosis not present

## 2022-06-12 DIAGNOSIS — N1832 Chronic kidney disease, stage 3b: Secondary | ICD-10-CM | POA: Diagnosis not present

## 2022-06-12 DIAGNOSIS — D509 Iron deficiency anemia, unspecified: Secondary | ICD-10-CM | POA: Diagnosis not present

## 2022-06-12 DIAGNOSIS — I2510 Atherosclerotic heart disease of native coronary artery without angina pectoris: Secondary | ICD-10-CM | POA: Diagnosis not present

## 2022-06-12 DIAGNOSIS — H409 Unspecified glaucoma: Secondary | ICD-10-CM | POA: Diagnosis not present

## 2022-06-12 DIAGNOSIS — Z951 Presence of aortocoronary bypass graft: Secondary | ICD-10-CM | POA: Diagnosis not present

## 2022-06-12 DIAGNOSIS — I5023 Acute on chronic systolic (congestive) heart failure: Secondary | ICD-10-CM | POA: Diagnosis not present

## 2022-06-12 DIAGNOSIS — I350 Nonrheumatic aortic (valve) stenosis: Secondary | ICD-10-CM | POA: Diagnosis not present

## 2022-06-12 LAB — POCT INR: INR: 2.1 (ref 2.0–3.0)

## 2022-06-12 NOTE — Telephone Encounter (Signed)
Called pt no answer °

## 2022-06-12 NOTE — Progress Notes (Signed)
This encounter was created in error - please disregard.

## 2022-06-12 NOTE — Telephone Encounter (Signed)
Spoke with patient spouse [name] to schedule patient AWV.  She stated patient is retaining fluid.  She stated Damita Dunnings from [facility] came down to evaluate him and would be sending report to pcp and cardiology  Spouse (linda) was very upset and crying.  I wanted to transfer her to office, but [name] declined

## 2022-06-12 NOTE — Telephone Encounter (Addendum)
Received a voicemail from the pt's daughter. Spoke with her as she stated she was currently at work and advised for the pt to continue warfarin 3mg  daily and she verbalized understanding. Also, asked if the pt will have his procedure on next week and she stated that "daddy is not in the best shape at this time and will more than likely have to hold off on this". She stated he has been sleeping a lot and having fluid issues. She will update [place] if pt does not have procedure. She is aware that if pt has the procedure the last dose would be 06/13/22 and hold 1/6 through 1/10 then resume post procedure or when MD states to do so. Will await a call from her regarding this.   Spoke with [place] RN and gave orders to continue  warfarin 3mg  daily and recheck INR & call results on 06/26/22. Also, advised we are unsure if the pt will have his EGD procedure on 06/19/22 and the daughter Keith Jenkins will update [place].

## 2022-06-12 NOTE — Patient Instructions (Signed)
Description   Take 3.5 tablets today and then continue taking 3 tablets daily.  On 1/6 start HOLDING Warfarin for EGD on 06/19/22. Resume Warfarin post procedure on 06/19/22 or as directed by provider. Recheck INR on 06/26/22 Recheck INR 1 week post procedure.  Coumadin Clinic [phone] Fax Clearance form to [phone]

## 2022-06-12 NOTE — Telephone Encounter (Signed)
[facility]N called during pt home visit to report increased concern Pt was asleep during visit-normal fully awake and responsive +fall earlier in the week + for bruising seen by PCP 06/11/22\ Weight is up x 13 lbs by her records 191 (dry weight) Reports they did increase the torsemide x 2 days ago Pt still has severe issues with sleeping (managed by pcp)  Overall ill appearing since last home visit a few weeks ago Please advise further if needed

## 2022-06-12 NOTE — Telephone Encounter (Signed)
Spoke with patients daughter she is aware.

## 2022-06-13 ENCOUNTER — Other Ambulatory Visit (HOSPITAL_COMMUNITY): Payer: Self-pay

## 2022-06-13 DIAGNOSIS — I5023 Acute on chronic systolic (congestive) heart failure: Secondary | ICD-10-CM | POA: Diagnosis not present

## 2022-06-13 DIAGNOSIS — Z87891 Personal history of nicotine dependence: Secondary | ICD-10-CM | POA: Diagnosis not present

## 2022-06-13 DIAGNOSIS — K219 Gastro-esophageal reflux disease without esophagitis: Secondary | ICD-10-CM | POA: Diagnosis not present

## 2022-06-13 DIAGNOSIS — I2510 Atherosclerotic heart disease of native coronary artery without angina pectoris: Secondary | ICD-10-CM | POA: Diagnosis not present

## 2022-06-13 DIAGNOSIS — M1990 Unspecified osteoarthritis, unspecified site: Secondary | ICD-10-CM | POA: Diagnosis not present

## 2022-06-13 DIAGNOSIS — Z7982 Long term (current) use of aspirin: Secondary | ICD-10-CM | POA: Diagnosis not present

## 2022-06-13 DIAGNOSIS — H409 Unspecified glaucoma: Secondary | ICD-10-CM | POA: Diagnosis not present

## 2022-06-13 DIAGNOSIS — Z9181 History of falling: Secondary | ICD-10-CM | POA: Diagnosis not present

## 2022-06-13 DIAGNOSIS — Z7901 Long term (current) use of anticoagulants: Secondary | ICD-10-CM | POA: Diagnosis not present

## 2022-06-13 DIAGNOSIS — E785 Hyperlipidemia, unspecified: Secondary | ICD-10-CM | POA: Diagnosis not present

## 2022-06-13 DIAGNOSIS — R1319 Other dysphagia: Secondary | ICD-10-CM | POA: Diagnosis not present

## 2022-06-13 DIAGNOSIS — I480 Paroxysmal atrial fibrillation: Secondary | ICD-10-CM | POA: Diagnosis not present

## 2022-06-13 DIAGNOSIS — J449 Chronic obstructive pulmonary disease, unspecified: Secondary | ICD-10-CM | POA: Diagnosis not present

## 2022-06-13 DIAGNOSIS — Z954 Presence of other heart-valve replacement: Secondary | ICD-10-CM | POA: Diagnosis not present

## 2022-06-13 DIAGNOSIS — N1832 Chronic kidney disease, stage 3b: Secondary | ICD-10-CM | POA: Diagnosis not present

## 2022-06-13 DIAGNOSIS — K222 Esophageal obstruction: Secondary | ICD-10-CM | POA: Diagnosis not present

## 2022-06-13 DIAGNOSIS — Z951 Presence of aortocoronary bypass graft: Secondary | ICD-10-CM | POA: Diagnosis not present

## 2022-06-13 DIAGNOSIS — I350 Nonrheumatic aortic (valve) stenosis: Secondary | ICD-10-CM | POA: Diagnosis not present

## 2022-06-13 DIAGNOSIS — D509 Iron deficiency anemia, unspecified: Secondary | ICD-10-CM | POA: Diagnosis not present

## 2022-06-13 DIAGNOSIS — I252 Old myocardial infarction: Secondary | ICD-10-CM | POA: Diagnosis not present

## 2022-06-13 LAB — CBC WITH DIFFERENTIAL/PLATELET
Basophils Absolute: 0.1 10*3/uL (ref 0.0–0.2)
Basos: 1 %
EOS (ABSOLUTE): 0.1 10*3/uL (ref 0.0–0.4)
Eos: 2 %
Hematocrit: 36 % — ABNORMAL LOW (ref 37.5–51.0)
Hemoglobin: 11.5 g/dL — ABNORMAL LOW (ref 13.0–17.7)
Immature Grans (Abs): 0 10*3/uL (ref 0.0–0.1)
Immature Granulocytes: 0 %
Lymphocytes Absolute: 0.8 10*3/uL (ref 0.7–3.1)
Lymphs: 13 %
MCH: 26.6 pg (ref 26.6–33.0)
MCHC: 31.9 g/dL (ref 31.5–35.7)
MCV: 83 fL (ref 79–97)
Monocytes Absolute: 0.9 10*3/uL (ref 0.1–0.9)
Monocytes: 14 %
Neutrophils Absolute: 4.1 10*3/uL (ref 1.4–7.0)
Neutrophils: 70 %
Platelets: 111 10*3/uL — ABNORMAL LOW (ref 150–450)
RBC: 4.32 x10E6/uL (ref 4.14–5.80)
RDW: 22.2 % — ABNORMAL HIGH (ref 11.6–15.4)
WBC: 5.9 10*3/uL (ref 3.4–10.8)

## 2022-06-13 LAB — COMPREHENSIVE METABOLIC PANEL
ALT: 40 IU/L (ref 0–44)
AST: 61 IU/L — ABNORMAL HIGH (ref 0–40)
Albumin/Globulin Ratio: 1.3 (ref 1.2–2.2)
Albumin: 3.9 g/dL (ref 3.8–4.8)
Alkaline Phosphatase: 181 IU/L — ABNORMAL HIGH (ref 44–121)
BUN/Creatinine Ratio: 27 — ABNORMAL HIGH (ref 10–24)
BUN: 52 mg/dL — ABNORMAL HIGH (ref 8–27)
Bilirubin Total: 1.2 mg/dL (ref 0.0–1.2)
CO2: 24 mmol/L (ref 20–29)
Calcium: 9 mg/dL (ref 8.6–10.2)
Chloride: 94 mmol/L — ABNORMAL LOW (ref 96–106)
Creatinine, Ser: 1.93 mg/dL — ABNORMAL HIGH (ref 0.76–1.27)
Globulin, Total: 3.1 g/dL (ref 1.5–4.5)
Glucose: 84 mg/dL (ref 70–99)
Potassium: 4.1 mmol/L (ref 3.5–5.2)
Sodium: 134 mmol/L (ref 134–144)
Total Protein: 7 g/dL (ref 6.0–8.5)
eGFR: 36 mL/min/{1.73_m2} — ABNORMAL LOW (ref >59)

## 2022-06-13 MED ORDER — ASPIRIN 81 MG PO TBEC: 81.0000 mg | DELAYED_RELEASE_TABLET | Freq: Every day | ORAL | 1 refills | Status: DC

## 2022-06-17 DIAGNOSIS — Z7982 Long term (current) use of aspirin: Secondary | ICD-10-CM | POA: Diagnosis not present

## 2022-06-17 DIAGNOSIS — J449 Chronic obstructive pulmonary disease, unspecified: Secondary | ICD-10-CM | POA: Diagnosis not present

## 2022-06-17 DIAGNOSIS — I480 Paroxysmal atrial fibrillation: Secondary | ICD-10-CM | POA: Diagnosis not present

## 2022-06-17 DIAGNOSIS — Z951 Presence of aortocoronary bypass graft: Secondary | ICD-10-CM | POA: Diagnosis not present

## 2022-06-17 DIAGNOSIS — I2510 Atherosclerotic heart disease of native coronary artery without angina pectoris: Secondary | ICD-10-CM | POA: Diagnosis not present

## 2022-06-17 DIAGNOSIS — E785 Hyperlipidemia, unspecified: Secondary | ICD-10-CM | POA: Diagnosis not present

## 2022-06-17 DIAGNOSIS — K219 Gastro-esophageal reflux disease without esophagitis: Secondary | ICD-10-CM | POA: Diagnosis not present

## 2022-06-17 DIAGNOSIS — D509 Iron deficiency anemia, unspecified: Secondary | ICD-10-CM | POA: Diagnosis not present

## 2022-06-17 DIAGNOSIS — Z954 Presence of other heart-valve replacement: Secondary | ICD-10-CM | POA: Diagnosis not present

## 2022-06-17 DIAGNOSIS — I252 Old myocardial infarction: Secondary | ICD-10-CM | POA: Diagnosis not present

## 2022-06-17 DIAGNOSIS — I5023 Acute on chronic systolic (congestive) heart failure: Secondary | ICD-10-CM | POA: Diagnosis not present

## 2022-06-17 DIAGNOSIS — H409 Unspecified glaucoma: Secondary | ICD-10-CM | POA: Diagnosis not present

## 2022-06-17 DIAGNOSIS — N1832 Chronic kidney disease, stage 3b: Secondary | ICD-10-CM | POA: Diagnosis not present

## 2022-06-17 DIAGNOSIS — Z7901 Long term (current) use of anticoagulants: Secondary | ICD-10-CM | POA: Diagnosis not present

## 2022-06-17 DIAGNOSIS — Z87891 Personal history of nicotine dependence: Secondary | ICD-10-CM | POA: Diagnosis not present

## 2022-06-17 DIAGNOSIS — R1319 Other dysphagia: Secondary | ICD-10-CM | POA: Diagnosis not present

## 2022-06-17 DIAGNOSIS — Z9181 History of falling: Secondary | ICD-10-CM | POA: Diagnosis not present

## 2022-06-17 DIAGNOSIS — K222 Esophageal obstruction: Secondary | ICD-10-CM | POA: Diagnosis not present

## 2022-06-17 DIAGNOSIS — M1990 Unspecified osteoarthritis, unspecified site: Secondary | ICD-10-CM | POA: Diagnosis not present

## 2022-06-17 DIAGNOSIS — I350 Nonrheumatic aortic (valve) stenosis: Secondary | ICD-10-CM | POA: Diagnosis not present

## 2022-06-18 ENCOUNTER — Telehealth: Payer: Self-pay | Admitting: Internal Medicine

## 2022-06-18 NOTE — Telephone Encounter (Signed)
Patient's daughter called, stated her father has a procedure tomorrow morning. Patient lost his instructions, they are unaware of what medications he should hold and also the instructions in regards to his procedure. Please advise.

## 2022-06-19 ENCOUNTER — Ambulatory Visit (HOSPITAL_BASED_OUTPATIENT_CLINIC_OR_DEPARTMENT_OTHER): Payer: Medicare HMO | Admitting: Certified Registered Nurse Anesthetist

## 2022-06-19 ENCOUNTER — Ambulatory Visit (HOSPITAL_COMMUNITY)
Admission: RE | Admit: 2022-06-19 | Discharge: 2022-06-19 | Disposition: A | Discharge status: Home / Self Care | Payer: Medicare HMO | Attending: Internal Medicine | Admitting: Internal Medicine

## 2022-06-19 ENCOUNTER — Ambulatory Visit (HOSPITAL_COMMUNITY): Payer: Medicare HMO | Admitting: Certified Registered Nurse Anesthetist

## 2022-06-19 ENCOUNTER — Other Ambulatory Visit: Payer: Self-pay

## 2022-06-19 ENCOUNTER — Encounter (HOSPITAL_COMMUNITY)
Admission: RE | Disposition: A | Discharge status: Home / Self Care | Payer: Self-pay | Source: Home / Self Care | Attending: Internal Medicine

## 2022-06-19 ENCOUNTER — Telehealth: Payer: Self-pay

## 2022-06-19 ENCOUNTER — Encounter: Payer: Self-pay | Admitting: Nurse Practitioner

## 2022-06-19 ENCOUNTER — Encounter (HOSPITAL_COMMUNITY): Payer: Self-pay | Admitting: Internal Medicine

## 2022-06-19 DIAGNOSIS — Z87891 Personal history of nicotine dependence: Secondary | ICD-10-CM | POA: Diagnosis not present

## 2022-06-19 DIAGNOSIS — K449 Diaphragmatic hernia without obstruction or gangrene: Secondary | ICD-10-CM | POA: Diagnosis not present

## 2022-06-19 DIAGNOSIS — K2970 Gastritis, unspecified, without bleeding: Secondary | ICD-10-CM

## 2022-06-19 DIAGNOSIS — I509 Heart failure, unspecified: Secondary | ICD-10-CM | POA: Diagnosis not present

## 2022-06-19 DIAGNOSIS — R1310 Dysphagia, unspecified: Secondary | ICD-10-CM | POA: Diagnosis not present

## 2022-06-19 DIAGNOSIS — Z951 Presence of aortocoronary bypass graft: Secondary | ICD-10-CM | POA: Diagnosis not present

## 2022-06-19 DIAGNOSIS — K222 Esophageal obstruction: Secondary | ICD-10-CM | POA: Insufficient documentation

## 2022-06-19 DIAGNOSIS — Z8249 Family history of ischemic heart disease and other diseases of the circulatory system: Secondary | ICD-10-CM | POA: Diagnosis not present

## 2022-06-19 DIAGNOSIS — I252 Old myocardial infarction: Secondary | ICD-10-CM

## 2022-06-19 DIAGNOSIS — K319 Disease of stomach and duodenum, unspecified: Secondary | ICD-10-CM | POA: Insufficient documentation

## 2022-06-19 DIAGNOSIS — Z5986 Financial insecurity: Secondary | ICD-10-CM | POA: Insufficient documentation

## 2022-06-19 DIAGNOSIS — R1319 Other dysphagia: Secondary | ICD-10-CM

## 2022-06-19 DIAGNOSIS — Z952 Presence of prosthetic heart valve: Secondary | ICD-10-CM | POA: Insufficient documentation

## 2022-06-19 DIAGNOSIS — K2900 Acute gastritis without bleeding: Secondary | ICD-10-CM

## 2022-06-19 HISTORY — PX: ESOPHAGOGASTRODUODENOSCOPY: SHX5428

## 2022-06-19 HISTORY — PX: BIOPSY: SHX5522

## 2022-06-19 HISTORY — PX: BALLOON DILATION: SHX5330

## 2022-06-19 SURGERY — ESOPHAGOGASTRODUODENOSCOPY (EGD) WITH PROPOFOL
Anesthesia: Monitor Anesthesia Care

## 2022-06-19 SURGERY — EGD (ESOPHAGOGASTRODUODENOSCOPY)
Anesthesia: Monitor Anesthesia Care

## 2022-06-19 MED ORDER — PROPOFOL 500 MG/50ML IV EMUL
INTRAVENOUS | Status: DC | PRN
  Administered 2022-06-19: 100 ug/kg/min via INTRAVENOUS

## 2022-06-19 MED ORDER — LACTATED RINGERS IV SOLN: INTRAVENOUS | Status: DC

## 2022-06-19 MED ORDER — LIDOCAINE 2% (20 MG/ML) 5 ML SYRINGE
INTRAMUSCULAR | Status: DC | PRN
  Administered 2022-06-19: 40 mg via INTRAVENOUS

## 2022-06-19 MED ORDER — SODIUM CHLORIDE 0.9 % IV SOLN: INTRAVENOUS | Status: DC

## 2022-06-19 MED ORDER — PROPOFOL 10 MG/ML IV BOLUS
INTRAVENOUS | Status: DC | PRN
  Administered 2022-06-19: 15 mg via INTRAVENOUS
  Administered 2022-06-19: 20 mg via INTRAVENOUS

## 2022-06-19 MED ORDER — PHENYLEPHRINE 80 MCG/ML (10ML) SYRINGE FOR IV PUSH (FOR BLOOD PRESSURE SUPPORT)
PREFILLED_SYRINGE | INTRAVENOUS | Status: DC | PRN
  Administered 2022-06-19: 80 ug via INTRAVENOUS

## 2022-06-19 MED ORDER — EPHEDRINE SULFATE-NACL 50-0.9 MG/10ML-% IV SOSY
PREFILLED_SYRINGE | INTRAVENOUS | Status: DC | PRN
  Administered 2022-06-19 (×2): 5 mg via INTRAVENOUS

## 2022-06-19 NOTE — Transfer of Care (Signed)
Immediate Anesthesia Transfer of Care Note  Patient: Keith Jenkins  Procedure(s) Performed: ESOPHAGOGASTRODUODENOSCOPY (EGD) with possible dilation  Patient Location: PACU  Anesthesia Type:MAC  Level of Consciousness: drowsy  Airway & Oxygen Therapy: Patient Spontanous Breathing and Patient connected to nasal cannula oxygen  Post-op Assessment: Report given to RN and Post -op Vital signs reviewed and stable  Post vital signs: Reviewed and stable  Last Vitals:  Vitals Value Taken Time  BP 115/82 06/19/22 1110  Temp 35.9 C 06/19/22 1110  Pulse 57 06/19/22 1112  Resp 17 06/19/22 1112  SpO2 94 % 06/19/22 1112  Vitals shown include unvalidated device data.  Last Pain:  Vitals:   06/19/22 1008  TempSrc: Temporal  PainSc: 0-No pain         Complications: No notable events documented.

## 2022-06-19 NOTE — Op Note (Addendum)
[facility] Patient Name: Keith Jenkins Procedure Date : 06/19/2022 MRN: [id number] Attending MD: Beverley Fiedler , MD, [id number] Date of Birth: 1946/03/12 CSN: [id number] Age: 77 Admit Type: Inpatient Procedure:                Upper GI endoscopy Indications:              Dysphagia, For therapy of esophageal stenosis;                            improvement after EGD on 04/08/2022 with dilation                            to 11 mm Providers:                Carie Caddy. Rhea Belton, MD, Dartha Lodge, RN, Kandice Robinsons, Technician Referring MD:             [name] Medicines:                Monitored Anesthesia Care Complications:            No immediate complications. Estimated Blood Loss:     Estimated blood loss was minimal. Procedure:                Pre-Anesthesia Assessment:                           - Prior to the procedure, a History and Physical                            was performed, and patient medications and                            allergies were reviewed. The patient's tolerance of                            previous anesthesia was also reviewed. The risks                            and benefits of the procedure and the sedation                            options and risks were discussed with the patient.                            All questions were answered, and informed consent                            was obtained. Prior Anticoagulants: The patient has                            taken Coumadin (warfarin), last dose was 5 days  prior to procedure. ASA Grade Assessment: III - A                            patient with severe systemic disease. After                            reviewing the risks and benefits, the patient was                            deemed in satisfactory condition to undergo the                            procedure.                           After obtaining informed consent, the  endoscope was                            passed under direct vision. Throughout the                            procedure, the patient's blood pressure, pulse, and                            oxygen saturations were monitored continuously. The                            [id number]-[id number] ([id number]) Olympus endoscope was introduced                            through the mouth, and advanced to the second part                            of duodenum. The upper GI endoscopy was                            accomplished without difficulty. The patient                            tolerated the procedure well. Scope In: Scope Out: Findings:      Two benign-appearing, intrinsic moderate (circumferential scarring or       stenosis; an endoscope may pass) stenoses were found 40 to 41 cm from       the incisors. The narrowest stenosis measured 1.1 cm (inner diameter) x       1 cm (in length), which is improved compared to last exam. The stenoses       were traversed without resistance. A TTS dilator was passed through the       scope. Dilation with a 12-13.5-15 mm balloon dilator was performed to 12       mm without change, to 13.5 mm with minimal change and then 15.0 mm. The       dilation site was examined and showed moderate mucosal disruption.      A 1 to 2 cm hiatal hernia was present.  Patchy mild inflammation characterized by erosions, erythema and       granularity was found in the gastric body and in the gastric antrum.       Biopsies were taken with a cold forceps for histology and Helicobacter       pylori testing.      The examined duodenum was normal. Impression:               - Benign-appearing esophageal stenoses. Dilated.                           - Small hiatal hernia.                           - Patchy gastritis. Biopsied to exclude H. Pylori                            infection.                           - Normal examined duodenum. Moderate Sedation:      N/A Recommendation:           -  Patient has a contact number available for                            emergencies. The signs and symptoms of potential                            delayed complications were discussed with the                            patient. Return to normal activities tomorrow.                            Written discharge instructions were provided to the                            patient.                           - Post-dilation diet (clear liquids only x 2                            hours). If tolerating clear liquids without issues                            then slowly advance diet as tolerated. Would                            maintain soft diet today and then advance tomorrow                            as tolerated.                           - Continue present medications.                           -  Resume Coumadin (warfarin) at prior dose                            tomorrow. Refer to managing physician for further                            adjustment of therapy.                           - Await pathology results.                           - Repeat upper endoscopy PRN for retreatment. Procedure Code(s):        --- Professional ---                           [phone], Esophagogastroduodenoscopy, flexible,                            transoral; with transendoscopic balloon dilation of                            esophagus (less than 30 mm diameter)                           43239, 59, Esophagogastroduodenoscopy, flexible,                            transoral; with biopsy, single or multiple Diagnosis Code(s):        --- Professional ---                           K22.2, Esophageal obstruction                           K29.70, Gastritis, unspecified, without bleeding                           K44.9, Diaphragmatic hernia without obstruction or                            gangrene                           R13.10, Dysphagia, unspecified CPT copyright 2022 American Medical Association. All rights  reserved. The codes documented in this report are preliminary and upon coder review may  be revised to meet current compliance requirements. Beverley Fiedler, MD 06/19/2022 11:14:11 AM This report has been signed electronically. Number of Addenda: 0

## 2022-06-19 NOTE — Anesthesia Postprocedure Evaluation (Signed)
Anesthesia Post Note  Patient: Keith Jenkins  Procedure(s) Performed: ESOPHAGOGASTRODUODENOSCOPY (EGD) with possible dilation     Patient location during evaluation: PACU Anesthesia Type: MAC Level of consciousness: awake and alert Pain management: pain level controlled Vital Signs Assessment: post-procedure vital signs reviewed and stable Respiratory status: spontaneous breathing, nonlabored ventilation, respiratory function stable and patient connected to nasal cannula oxygen Cardiovascular status: stable and blood pressure returned to baseline Postop Assessment: no apparent nausea or vomiting Anesthetic complications: no  No notable events documented.  Last Vitals:  Vitals:   06/19/22 1115 06/19/22 1130  BP: 110/76 95/71  Pulse: (!) 56 (!) 58  Resp: 13 13  Temp:    SpO2: 95% 92%    Last Pain:  Vitals:   06/19/22 1130  TempSrc:   PainSc: 0-No pain                 Effie Berkshire

## 2022-06-19 NOTE — Progress Notes (Cosign Needed)
06-19-2022: Left patient's wife a VM following up with out of pocket expense report that was requested in December.  [place] Pharmacist Assistant [phone]

## 2022-06-19 NOTE — Telephone Encounter (Signed)
The pt has already arrived for the procedure today.

## 2022-06-19 NOTE — Anesthesia Procedure Notes (Signed)
Procedure Name: MAC Date/Time: 06/19/2022 10:49 AM  Performed by: Colin Benton, CRNAPre-anesthesia Checklist: Patient identified, Emergency Drugs available, Suction available and Patient being monitored Patient Re-evaluated:Patient Re-evaluated prior to induction Oxygen Delivery Method: Nasal cannula Induction Type: IV induction Airway Equipment and Method: Bite block Placement Confirmation: positive ETCO2 Dental Injury: Teeth and Oropharynx as per pre-operative assessment

## 2022-06-19 NOTE — Progress Notes (Cosign Needed)
AZ&ME Notification:  Patient approved for 2024 Enrollment with [employer] Patient Assistance Program for Farxiga, enrollment will end on June 09, 2023.   [name] Melvin, CMA Clinical Pharmacist Assistant [phone]  
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
## 2022-06-19 NOTE — Anesthesia Preprocedure Evaluation (Addendum)
Anesthesia Evaluation  Patient identified by MRN, date of birth, ID band Patient awake    Reviewed: Allergy & Precautions, NPO status , Patient's Chart, lab work & pertinent test results  Airway Mallampati: II  TM Distance: >3 FB Neck ROM: Full    Dental  (+) Edentulous Upper, Edentulous Lower   Pulmonary former smoker   breath sounds clear to auscultation       Cardiovascular + Past MI, + CABG and +CHF   Rhythm:Regular Rate:Normal  - s/p MVR  Echo:  1. Left ventricular ejection fraction, by estimation, is 20 to 25%. The  left ventricle has severely decreased function. The left ventricle  demonstrates global hypokinesis. The left ventricular internal cavity size  was mildly to moderately dilated. Left  ventricular diastolic parameters are indeterminate.   2. Right ventricular systolic function is moderately reduced. The right  ventricular size is mildly enlarged. There is normal pulmonary artery  systolic pressure.   3. Left atrial size was moderately dilated.   4. Right atrial size was moderately dilated.   5. Large pleural effusion in the left lateral region.   6. The mitral valve has been repaired/replaced. No evidence of mitral  valve regurgitation. Mild mitral stenosis. The mean mitral valve gradient  is 4.0 mmHg. There is a MITRAL MITRIS RESILIA 27 present in the mitral  position. Procedure Date: 11/01/21.   7. Suspect component of low gradient AS. Likely moderate AS.  Dimensionless index = 0.31. The aortic valve is tricuspid. There is  moderate calcification of the aortic valve. Aortic valve regurgitation is  mild. Moderate aortic valve stenosis. Aortic  regurgitation PHT measures 497 msec. Aortic valve area, by VTI measures  1.27 cm. Aortic valve mean gradient measures 13.2 mmHg. Aortic valve Vmax  measures 2.52 m/s.   8. Aortic dilatation noted. There is mild dilatation of the ascending  aorta, measuring 41 mm.    9. The inferior vena cava is dilated in size with >50% respiratory  variability, suggesting right atrial pressure of 8 mmHg.    Neuro/Psych negative neurological ROS  negative psych ROS   GI/Hepatic negative GI ROS, Neg liver ROS,,,  Endo/Other  negative endocrine ROS    Renal/GU Renal disease     Musculoskeletal  (+) Arthritis ,    Abdominal Normal abdominal exam  (+)   Peds  Hematology negative hematology ROS (+)   Anesthesia Other Findings   Reproductive/Obstetrics                              Anesthesia Physical Anesthesia Plan  ASA: 4  Anesthesia Plan: MAC   Post-op Pain Management:    Induction: Intravenous  PONV Risk Score and Plan: 0 and Propofol infusion  Airway Management Planned: Natural Airway and Simple Face Mask  Additional Equipment: None  Intra-op Plan:   Post-operative Plan:   Informed Consent: I have reviewed the patients History and Physical, chart, labs and discussed the procedure including the risks, benefits and alternatives for the proposed anesthesia with the patient or authorized representative who has indicated his/her understanding and acceptance.       Plan Discussed with: CRNA  Anesthesia Plan Comments:          Anesthesia Quick Evaluation

## 2022-06-19 NOTE — Procedures (Addendum)
I spoke to the patient's daughter by phone after checking on pt in PACU. We reviewed the EGD findings, treatment and recommendations. Time provided for questions and answers and she thanked me for the call.

## 2022-06-20 ENCOUNTER — Telehealth: Payer: Medicare HMO

## 2022-06-20 ENCOUNTER — Other Ambulatory Visit: Payer: Self-pay

## 2022-06-20 ENCOUNTER — Ambulatory Visit: Payer: Medicare HMO

## 2022-06-20 DIAGNOSIS — Z9181 History of falling: Secondary | ICD-10-CM | POA: Diagnosis not present

## 2022-06-20 DIAGNOSIS — E785 Hyperlipidemia, unspecified: Secondary | ICD-10-CM | POA: Diagnosis not present

## 2022-06-20 DIAGNOSIS — Z87891 Personal history of nicotine dependence: Secondary | ICD-10-CM | POA: Diagnosis not present

## 2022-06-20 DIAGNOSIS — Z7901 Long term (current) use of anticoagulants: Secondary | ICD-10-CM | POA: Diagnosis not present

## 2022-06-20 DIAGNOSIS — I480 Paroxysmal atrial fibrillation: Secondary | ICD-10-CM | POA: Diagnosis not present

## 2022-06-20 DIAGNOSIS — H409 Unspecified glaucoma: Secondary | ICD-10-CM | POA: Diagnosis not present

## 2022-06-20 DIAGNOSIS — K219 Gastro-esophageal reflux disease without esophagitis: Secondary | ICD-10-CM | POA: Diagnosis not present

## 2022-06-20 DIAGNOSIS — D509 Iron deficiency anemia, unspecified: Secondary | ICD-10-CM | POA: Diagnosis not present

## 2022-06-20 DIAGNOSIS — R1319 Other dysphagia: Secondary | ICD-10-CM | POA: Diagnosis not present

## 2022-06-20 DIAGNOSIS — I5023 Acute on chronic systolic (congestive) heart failure: Secondary | ICD-10-CM | POA: Diagnosis not present

## 2022-06-20 DIAGNOSIS — Z954 Presence of other heart-valve replacement: Secondary | ICD-10-CM | POA: Diagnosis not present

## 2022-06-20 DIAGNOSIS — I350 Nonrheumatic aortic (valve) stenosis: Secondary | ICD-10-CM | POA: Diagnosis not present

## 2022-06-20 DIAGNOSIS — M1990 Unspecified osteoarthritis, unspecified site: Secondary | ICD-10-CM | POA: Diagnosis not present

## 2022-06-20 DIAGNOSIS — J449 Chronic obstructive pulmonary disease, unspecified: Secondary | ICD-10-CM | POA: Diagnosis not present

## 2022-06-20 DIAGNOSIS — K222 Esophageal obstruction: Secondary | ICD-10-CM | POA: Diagnosis not present

## 2022-06-20 DIAGNOSIS — Z951 Presence of aortocoronary bypass graft: Secondary | ICD-10-CM | POA: Diagnosis not present

## 2022-06-20 DIAGNOSIS — I252 Old myocardial infarction: Secondary | ICD-10-CM | POA: Diagnosis not present

## 2022-06-20 DIAGNOSIS — Z7982 Long term (current) use of aspirin: Secondary | ICD-10-CM | POA: Diagnosis not present

## 2022-06-20 DIAGNOSIS — I2510 Atherosclerotic heart disease of native coronary artery without angina pectoris: Secondary | ICD-10-CM | POA: Diagnosis not present

## 2022-06-20 DIAGNOSIS — F320 Major depressive disorder, single episode, mild: Secondary | ICD-10-CM

## 2022-06-20 DIAGNOSIS — N1832 Chronic kidney disease, stage 3b: Secondary | ICD-10-CM | POA: Diagnosis not present

## 2022-06-20 LAB — SURGICAL PATHOLOGY

## 2022-06-20 MED ORDER — ESCITALOPRAM OXALATE 10 MG PO TABS: 10.0000 mg | ORAL_TABLET | Freq: Every day | ORAL | 1 refills | Status: DC

## 2022-06-20 MED ORDER — EZETIMIBE 10 MG PO TABS: 5.0000 mg | ORAL_TABLET | Freq: Every day | ORAL | 1 refills | Status: DC

## 2022-06-20 NOTE — Progress Notes (Cosign Needed)
Was able to request OOP expense for 2023 to be faxed to Dr. Alyse Low office then trelegy app can be faxed. Felicity Coyer stated for [employer] grant "Its depending on medication and diagnosis is how the grants work, I did not see any diagnosis for him that were open". Midodrine rx was received but they left a VM with patient's wife to call and set up for payments. Eliquis/Xarelto scripts will have to be sent into [facility] to determine cost.

## 2022-06-20 NOTE — Patient Outreach (Signed)
Care Management & Coordination Services Pharmacy Note  06/20/2022 Name:  Keith Jenkins MRN:  [id number] DOB:  06/06/1946  Summary: -Very very very pleasant couple presents for f/u CCM visit. They have been married for 56 years. He worked as a Administrator. Keith Jenkins, his wife, grew up in [place]. Writes poems. Read me one she just finished called "Dust on the Bible"  Recommendations/Changes made from today's visit: -Says she doesn't have any money. Seeing pam on the 23rd. Living off of Credit Cards. No plumbing in bathroom. Let Pam know -Will call and check on status of PAP's that haven't been approved     Subjective: Keith Jenkins is an 77 y.o. year old male who is a primary patient of Rip Harbour, NP.  The care coordination team was consulted for assistance with disease management and care coordination needs.    Engaged with patient by telephone for follow up visit.  Recent office visits:  04-17-2022 [name], RN (CCM).   04-14-2022 Rip Harbour, NP. START Gabapentin 600 mg at bedtime.    02-19-2022 Rochel Brome, MD. Flu vaccine given.    01-06-2022 Rip Harbour, NP. Hemo= 10.3, Hema= 32.2, MCV= 75, MCH= 24.0, RDW= 17.1. Creatinine= 1.32, eGFR= 57, Chloride= 93. HDL= 36   Recent consult visits:  04-15-2022 Leonidas Romberg, RN (Cardiology). Anti coag visit.   04-08-2022 Jerene Bears, MD. ESOPHAGOGASTRODUODENOSCOPY (EGD) WITH PROPOFOL procedure.    04-01-2022 Leonidas Romberg, RN (Cardiology). Anti coag visit.   03-31-2022 Rafael Bihari, FNP (Vascular). B Natriuretic Peptide= 1,681.5. Sodium= 134, Glucose= 135, BUN= 32, Creatinine= 1.91, Calcium= 8.7, GFR, Estimated= 36. RBC= 4.10, Hemo= 10.2, HCT= 30.7, MCV= 74.9, MCH= 24.9, RDW= 29.9.    03-25-2022 Leonidas Romberg, RN (Cardiology). Anti coag visit.   03-12-2022 Bensimhon, Shaune Pascal, MD  (Cardiology). RIGHT HEART CATH and THORACENTESIS procedures.   03-12-2022 Rafael Bihari, FNP  (Vascular). B natriuretic peptide= 1,417.5. RBC= 3.96, Hemo= 8.5, HCT= 26.9, MCV= 67.9, MCH= 21.5, RDW= 19.2. BUN= 34, Creatinine= 1.96, Albumin= 3.2, Egfr= 35.   03-11-2022 Leonidas Romberg, RN (Cardiology). Anti coag visit.   03-04-2022 Leonidas Romberg, RN (Cardiology). Anti coag visit.   02-25-2022 Leonidas Romberg, RN (Cardiology). Anti coag visit.   02-18-2022 Leonidas Romberg, RN (Cardiology). Anti coag visit.   02-14-2022 Rafael Bihari, FNP. B natriuretic peptide= 1, 321.3. Glucose= 100, Creatinine= 1.59, Gfr= 45.   02-11-2022 Hemphill, Candance B, RN (Cardiology). Anti coag visit.   02-04-2022 Hemphill, Myrtis Hopping, RN (Cardiology). Anti coag visit.   02-04-2022 Pyrtle, Lajuan Lines, MD Gertie Fey). Follow up visit.   01-22-2022 Gaye Pollack, MD (Cardiothoracic Surgery). Follow up.   01-21-2022 Leonidas Romberg, RN (Cardiology). Anti coag visit.   01-14-2022 Leonidas Romberg, RN (Cardiology). Anti coag visit.   01-14-2022 Scarlette Calico, RN (Vascular). DECREASE Midodrine to 5 mg Three times a day and START Zetia 5 mg (1/2 tab) Daily.   01-07-2022 Leonidas Romberg, RN (Cardiology). Anti coag visit.   12-31-2021 Leonidas Romberg, RN (Cardiology). Anti coag visit.   12-24-2021 Leonidas Romberg, RN (Cardiology). Anti coag visit.   12-17-2021 Leonidas Romberg, RN (Cardiology). Anti coag visit.   12-11-2021 Leonidas Romberg, RN (Cardiology). Anti coag visit.   12-05-2021 Gaye Pollack, MD (Cardiothoracic). Follow up visit.   12-04-2021 Rafael Bihari, FNP (Vascular). B Natriuretic Peptide= 1,268.7. Chloride= 96, Creatinine= 1.36, Calcium= 8.5, Egfr= 55.   12-03-2021 Leonidas Romberg, RN (  Cardiology). Anti coag visit.   11-26-2021 Memory Dance, RN (Cardiology). Anti coag visit.   Hospital visits:  Medication Reconciliation was completed by comparing discharge summary, patient's EMR and Pharmacy list, and upon discussion with patient.   Admitted  to the hospital on 02-20-2022 due to Fall encounter. Discharge date was 02-20-2022. Discharged from [facility].     New?Medications Started at [facility] Discharge:?? None   Medication Changes at Hospital Discharge: None   Medications Discontinued at Hospital Discharge: None   Medications that remain the same after Hospital Discharge:??  -All other medications will remain the same.     Hospital visits:  Medication Reconciliation was completed by comparing discharge summary, patient's EMR and Pharmacy list, and upon discussion with patient.   Admitted to the hospital on 02-12-2022 due to Scalp laceration. Discharge date was 02-12-2022. Discharged from [facility].     New?Medications Started at [facility] Discharge:?? None   Medication Changes at Hospital Discharge: None   Medications Discontinued at Hospital Discharge: None   Medications that remain the same after Hospital Discharge:??  -All other medications will remain the same.     Hospital visits:  Medication Reconciliation was completed by comparing discharge summary, patient's EMR and Pharmacy list, and upon discussion with patient.   Admitted to the hospital on 01-28-2022 due to Fall encounter. Discharge date was 01-28-2022. Discharged from [facility].     New?Medications Started at [facility] Discharge:?? None   Medication Changes at Hospital Discharge: None   Medications Discontinued at Hospital Discharge: None   Medications that remain the same after Hospital Discharge:??  -All other medications will remain the same.     Objective:  Lab Results  Component Value Date   CREATININE 1.93 (H) 06/11/2022   BUN 52 (H) 06/11/2022   EGFR 36 (L) 06/11/2022   GFRNONAA 32 (L) 05/15/2022   GFRAA 65 03/27/2020   NA 134 06/11/2022   K 4.1 06/11/2022   CALCIUM 9.0 06/11/2022   CO2 24 06/11/2022   GLUCOSE 84 06/11/2022    Lab Results  Component Value Date/Time   HGBA1C 6.0 (H) 03/18/2022  05:58 AM   HGBA1C 6.0 (H) 11/01/2021 04:30 AM    Last diabetic Eye exam: No results found for: "HMDIABEYEEXA"  Last diabetic Foot exam: No results found for: "HMDIABFOOTEX"   Lab Results  Component Value Date   CHOL 75 03/18/2022   HDL 33 (L) 03/18/2022   LDLCALC 29 03/18/2022   TRIG 63 03/18/2022   CHOLHDL 2.3 03/18/2022       Latest Ref Rng & Units 06/11/2022   11:07 AM 03/18/2022    5:56 AM 03/12/2022   12:20 PM  Hepatic Function  Total Protein 6.0 - 8.5 g/dL 7.0  6.9  6.7   Albumin 3.8 - 4.8 g/dL 3.9  3.3  3.2   AST 0 - 40 IU/L 61  33  35   ALT 0 - 44 IU/L 40  27  30   Alk Phosphatase 44 - 121 IU/L 181  89  90   Total Bilirubin 0.0 - 1.2 mg/dL 1.2  1.2  1.1   Bilirubin, Direct 0.0 - 0.2 mg/dL  0.2      Lab Results  Component Value Date/Time   TSH 1.049 03/18/2022 05:58 AM   TSH 0.691 03/12/2022 12:14 PM   TSH 1.100 03/27/2020 11:16 AM   FREET4 1.24 (H) 03/18/2022 05:58 AM       Latest Ref Rng & Units 06/11/2022  11:07 AM 05/15/2022    2:03 PM 03/31/2022    2:41 PM  CBC  WBC 3.4 - 10.8 x10E3/uL 5.9  5.2  5.2   Hemoglobin 13.0 - 17.7 g/dL 50.5  39.7  67.3   Hematocrit 37.5 - 51.0 % 36.0  33.0  30.7   Platelets 150 - 450 x10E3/uL 111  148  198     Lab Results  Component Value Date/Time   VITAMINB12 812 05/14/2022 09:18 AM   VITAMINB12 606 03/13/2022 07:56 AM    Clinical ASCVD: Yes  The ASCVD Risk score (Arnett DK, et al., 2019) failed to calculate for the following reasons:   The patient has a prior MI or stroke diagnosis    Other: (CHADS2VASc if Afib, MMRC or CAT for COPD, ACT, DEXA)     04/14/2022   10:06 AM 10/23/2021   11:56 AM 07/09/2021   10:05 AM  Depression screen PHQ 2/9  Decreased Interest 0 0 0  Down, Depressed, Hopeless 0 1 0  PHQ - 2 Score 0 1 0  Altered sleeping 0 1   Tired, decreased energy 0 1   Change in appetite 0 1   Feeling bad or failure about yourself  0 1   Trouble concentrating 0 0   Moving slowly or fidgety/restless 0 0    Suicidal thoughts 0 0   PHQ-9 Score 0 5   Difficult doing work/chores Not difficult at all Not difficult at all      Social History   Tobacco Use  Smoking Status Former   Types: Cigarettes   Quit date: 1970   Years since quitting: 54.0  Smokeless Tobacco Never   BP Readings from Last 3 Encounters:  06/19/22 95/71  06/11/22 (!) 140/90  05/15/22 108/68   Pulse Readings from Last 3 Encounters:  06/19/22 (!) 58  06/11/22 74  05/15/22 64   Wt Readings from Last 3 Encounters:  06/19/22 205 lb 0.4 oz (93 kg)  06/11/22 205 lb (93 kg)  05/15/22 193 lb (87.5 kg)   BMI Readings from Last 3 Encounters:  06/19/22 26.32 kg/m  06/11/22 26.32 kg/m  05/15/22 24.78 kg/m    No Known Allergies  Medications Reviewed Today     Reviewed by Jewel Baize, RN (Registered Nurse) on 06/19/22 at 1038  Med List Status: Complete   Medication Order Taking? Sig Documenting Provider Last Dose Status Informant  albuterol (PROVENTIL) (2.5 MG/3ML) 0.083% nebulizer solution [id number] Yes Inhale 3 mLs into the lungs every 6 (six) hours as needed for wheezing or shortness of breath. [name], NP  Active Family Member  albuterol (VENTOLIN HFA) 108 (90 Base) MCG/ACT inhaler [id number] Yes Inhale 2 puffs into the lungs every 6 (six) hours as needed for wheezing or shortness of breath. [name], NP  Active Family Member  amiodarone (PACERONE) 200 MG tablet [id number] Yes Take 0.5 tablets (100 mg total) by mouth daily. Bensimhon, Bevelyn Buckles, MD 06/19/2022 Active Family Member  aspirin EC 81 MG tablet [id number] Yes Take 1 tablet (81 mg total) by mouth daily. Swallow whole. [place], [place] 06/19/2022 Active Family Member  atorvastatin (LIPITOR) 80 MG tablet [id number] Yes Take 1 tablet (80 mg total) by mouth daily. Bensimhon, Bevelyn Buckles, MD 06/19/2022 Active Family Member  cyanocobalamin (VITAMIN B12) 1000 MCG/ML injection [id number] Yes Inject 1,000 mcg into the muscle every 30 (thirty)  days. [provider]  Active Family Member  dapagliflozin propanediol (FARXIGA) 10 MG TABS tablet [id number] Yes Take 1  tablet (10 mg total) by mouth daily. Bensimhon, Shaune Pascal, MD 06/19/2022 Active Family Member  digoxin (LANOXIN) 0.125 MG tablet [id number] Yes Take 0.5 tablets (0.0625 mg total) by mouth every other day.  Patient taking differently: Take 0.125 mg by mouth daily.   Lyda Jester M, PA-C 06/19/2022 Active Family Member  escitalopram (LEXAPRO) 10 MG tablet [id number] Yes Take 1 tablet (10 mg total) by mouth daily. Rip Harbour, NP 06/19/2022 Active Family Member  esomeprazole (NEXIUM) 40 MG capsule [id number] Yes Take 40 mg by mouth daily. [provider] 06/19/2022 Active Family Member  ezetimibe (ZETIA) 10 MG tablet [id number] Yes Take 0.5 tablets (5 mg total) by mouth daily. Bensimhon, Shaune Pascal, MD 06/19/2022 Active Family Member  Fluticasone-Umeclidin-Vilant (TRELEGY ELLIPTA) 100-62.5-25 MCG/ACT AEPB [id number] Yes Inhale 1 puff into the lungs daily. Rip Harbour, NP  Active Family Member  gabapentin (NEURONTIN) 600 MG tablet [id number] Yes Take 1 tablet (600 mg total) by mouth at bedtime.  Patient taking differently: Take 600 mg by mouth at bedtime as needed (pain).   Rip Harbour, NP 06/18/2022 Active Family Member  Iron, Ferrous Sulfate, 325 (65 Fe) MG TABS [id number] Yes Take 325 mg by mouth daily. Rip Harbour, NP 06/19/2022 Active Family Member  latanoprost (XALATAN) 0.005 % ophthalmic solution [id number] Yes Place 1 drop into both eyes at bedtime. [provider]  Active Family Member  Melatonin 3 MG CAPS [id number] Yes Take 3 mg by mouth at bedtime. [provider] 06/18/2022 Active Family Member  midodrine (PROAMATINE) 10 MG tablet [id number] Yes Take 1 tablet (10 mg total) by mouth 3 (three) times daily. Rip Harbour, NP 06/19/2022 Active Family Member  nitroGLYCERIN (NITROSTAT) 0.4 MG SL tablet [id number] Yes Place 1 tablet  (0.4 mg total) under the tongue every 5 (five) minutes as needed for chest pain. Rafael Bihari, [place]  Active Family Member  OVER THE COUNTER MEDICATION [id number] Yes Apply 1 application  topically at bedtime. Medication: Mag Maxx Cream Back and restless leg [provider]  Active Family Member  potassium chloride SA (KLOR-CON M) 20 MEQ tablet [id number] Yes Take 2 tablets (40 mEq total) by mouth daily. Bensimhon, Shaune Pascal, MD 06/19/2022 Active Family Member  spironolactone (ALDACTONE) 25 MG tablet [id number] Yes Take 1 tablet (25 mg total) by mouth daily. Bensimhon, Shaune Pascal, MD 06/19/2022 Active Family Member  torsemide (DEMADEX) 20 MG tablet [id number] Yes Take 3 tablets (60 mg total) by mouth daily.  Patient taking differently: Take 80 mg by mouth daily. Taking 80 mg until further notice of lab work   [place], Shaune Pascal, MD 06/18/2022 Active Family Member           Med Note Wilmon Pali, MELISSA R   Tue Jun 17, 2022 10:31 AM)    warfarin (COUMADIN) 1 MG tablet [id number] No Take 2 tablets to 3 tablets by mouth daily as directed by [facility] Bensimhon, Shaune Pascal, MD 06/14/2022 Active Family Member           Med Note Wilmon Pali, MELISSA R   Tue Jun 17, 2022 10:28 AM) Med on Hold for procedure 06/19/22            SDOH:  (Social Determinants of Health) assessments and interventions performed: Yes SDOH Interventions    Flowsheet Row Care Coordination from 06/20/2022 in [place] Management from 05/22/2022 in [place] Management from 04/24/2022 in [facility] Telephone from 04/18/2022 in [place]  Care Coordination Chronic Care Management from 04/17/2022 in [facility] Office Visit from 04/14/2022 in [place] Interventions        Food Insecurity Interventions -- -- -- Other (Comment)  [Mailed food pantry list] Other (Comment)  [states it is getting harder to get food, is receptive to  talking to care guides for food resources] --  Housing Interventions -- -- -- -- Other (Comment)  [has repairs that are needed in the home and cannot afford, open to talking to careguides for assistance with resources in their area] --  Transportation Interventions Other (Comment)  [Meds delivered] Other (Comment)  [Pharmacy delivering meds now] Intervention Not Indicated -- Intervention Not Indicated --  Utilities Interventions -- -- -- Other (Comment)  [Mailed utility assistance resources] Ambulatory [id number] Order  [needs assistance with utilities] --  Alcohol Usage Interventions -- -- -- -- Intervention Not Indicated (Score <7) --  Financial Strain Interventions Other (Comment)  [PAP] Other (Comment)  [PAP's] Other (Comment)  [PAP] -- Other (Comment)  [careguide referral for resources in the area to help with financial assistance, food, and utilities] Intervention Not Indicated  Physical Activity Interventions -- -- -- -- -- Intervention Not Indicated  Stress Interventions -- -- -- -- -- Intervention Not Indicated  Social Connections Interventions -- -- -- -- Intervention Not Indicated --      SDOH Screenings   Food Insecurity: No Food Insecurity (04/18/2022)  Housing: Low Risk  (04/17/2022)  Transportation Needs: Unmet Transportation Needs (06/20/2022)  Utilities: Not At Risk (04/18/2022)  Alcohol Screen: Low Risk  (04/17/2022)  Depression (PHQ2-9): Low Risk  (04/14/2022)  Financial Resource Strain: High Risk (06/20/2022)  Physical Activity: Sufficiently Active (04/14/2022)  Social Connections: Moderately Integrated (04/17/2022)  Stress: No Stress Concern Present (04/14/2022)  Tobacco Use: Medium Risk (06/19/2022)    Medication Assistance:  See CP   Name and location of Current pharmacy:  [place], [place] [address] Phone: [phone] Fax: [phone]  [facility] - [place], [place] - [address]  [address] [place] [address] Phone: [phone] Fax: [phone]    Compliance/Adherence/Medication fill history: Care Gaps: Last Annual Wellness visit? None Last Colonoscopy? None Last Mammogram? None Last Bone Denisty? None AWV never done Shingrix overdue Colonoscopy Covid booster overdue   Last eye exam / retinopathy screening? [facility] this [facility] Last diabetic foot exam? None   Assessment/Plan   CAD: (LDL goal < 70) - LHC 05/23: 100% m RCA and 90% m LAD.  - Echo (5/23) EF 25%, RV moderately reduced, moderate to severe MR - s/p CABG x 2 (LIMA-LAD, SVG-Ramus INT)  (11/01/21) The ASCVD Risk score (Arnett DK, et al., 2019) failed to calculate for the following reasons:   The patient has a prior MI or stroke diagnosis Lab Results  Component Value Date   CHOL 75 03/18/2022   CHOL 131 01/06/2022   CHOL 194 07/09/2021   Lab Results  Component Value Date   HDL 33 (L) 03/18/2022   HDL 36 (L) 01/06/2022   HDL 55 07/09/2021   Lab Results  Component Value Date   LDLCALC 29 03/18/2022   LDLCALC 79 01/06/2022   LDLCALC 129 (H) 07/09/2021   Lab Results  Component Value Date   TRIG 63 03/18/2022   TRIG 79 01/06/2022   TRIG 55 07/09/2021   Lab Results  Component Value Date  CHOLHDL 2.3 03/18/2022   CHOLHDL 3.6 01/06/2022   CHOLHDL 3.5 07/09/2021   No results found for: "LDLDIRECT" Last vitamin D No results found for: "25OHVITD2", "25OHVITD3", "VD25OH" Lab Results  Component Value Date   TSH 1.049 03/18/2022   -Controlled -Current treatment: Atorvastatin 80mg  Appropriate, Effective, Safe, Accessible ASA 81mg  Appropriate, Effective, Safe, Accessible Ezetimibe 10mg  Appropriate, Effective, Safe, Accessible -Medications previously tried: N/A  -Current dietary patterns: "Tries to eat healthy" -Current exercise habits: N/A -Educated on Cholesterol goals;  -Recommended to continue current medication   Pre-Diabetes  (A1c goal <6.5%) Lab Results  Component Value Date   HGBA1C 6.0 (H) 03/18/2022   HGBA1C 6.0 (H) 11/01/2021   HGBA1C 5.8 (H) 07/09/2021   Lab Results  Component Value Date   LDLCALC 29 03/18/2022   CREATININE 1.93 (H) 06/11/2022    Lab Results  Component Value Date   NA 134 06/11/2022   K 4.1 06/11/2022   CREATININE 1.93 (H) 06/11/2022   EGFR 36 (L) 06/11/2022   GFRNONAA 32 (L) 05/15/2022   GLUCOSE 84 06/11/2022    Lab Results  Component Value Date   WBC 5.9 06/11/2022   HGB 11.5 (L) 06/11/2022   HCT 36.0 (L) 06/11/2022   MCV 83 06/11/2022   PLT 111 (L) 06/11/2022    No results found for: "LABMICR", "MICROALBUR"  No results found for: "LABMICR", "MICROALBUR" -Controlled -Current medications: Dapagliflozin Appropriate, Effective, Safe, Query accessible PAP Approved 2024 -Medications previously tried: N/A  -Current home glucose readings fasting glucose: Doesn't test -Denies hypoglycemic/hyperglycemic symptoms -Educated on A1c and blood sugar goals; -Counseled to check feet daily and get yearly eye exams -November 2023: Start PAP for Dapagliflozin December 2023: Dapagliflozin PAP submitted at beginning of month. Sent msg to [employer] via teams to call company to check on status   Atrial Fibrillation (Goal: prevent stroke and major bleeding) -Controlled -CHADSVASC: 6 -Current treatment: Rhythm control:  Amiodarone 100mg  QD Appropriate, Effective, Safe, Accessible Digoxin 0.0625mg  Appropriate, Effective, Safe, Accessible Anticoagulation:  Warfarin UD Appropriate, Effective, Safe, Accessible -Medications previously tried: N/A -Home BP and HR readings: Doesn't test  -Counseled on increased risk of stroke due to Afib and benefits of anticoagulation for stroke prevention; November 2023: Will have CCM team look into potential of a DOAC and see if patient has spent 3% on meds. If so, will contact Cardio Jan 2024: Patient still hasn't brought in cost statement for DOAC.  Sent msg to my team to call Pharmacy ourselves   Heart Failure (Goal: manage symptoms and prevent exacerbations) -Controlled -Last ejection fraction:  EF 25-30 % per ECHO, attends HF clinic s/p late presentation inferior STEMI 05/23 Echo (5/23): EF 25-30%, RV moderately reduced, moderate to severe MR. 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 Echo 03/12/22 showed EF 20-25%,  -HF type: HFrEF (EF < 40%) -NYHA Class: IV (significant limitation of activity / dyspnea at rest) -AHA HF Stage: D (Advanced disease requiring aggressive medical therapy) -Current treatment: Dapagliflozin 10mg  Appropriate, Effective, Safe, Query accessible PAP Approved 2024 Digoxin 0.625mg  Appropriate, Effective, Safe, Accessible Midrodine 10mg  TID Appropriate, Effective, Safe, Query accessible $100 at pharmacy Jan 2024: Will have team call to check on status of PAP KCl Appropriate, Effective, Safe, Accessible Spironolactone 25mg  Appropriate, Effective, Safe, Accessible Torsemide 20mg  Appropriate, Effective, Safe, Accessible -Medications previously tried: N/A  -Current home BP/HR readings: N/A -Current dietary habits: "Tries to eat healthy" -Current exercise habits: N/A -Educated on Benefits  of medications for managing symptoms and prolonging life November 2023: Star Farxiga PAP. Called [employer], spoke with Dorene Sorrow, he's going to credit patient $40 (They accidentally messed up charging for patient. Unsure of details). Coordinated with Archer Asa is going to work on a grant for Spironolactone and Digoxin via direct message. Will get a month's worth of samples to last December 2023: Sent Archer Asa the following direct msg, "Howdy! I believe you and I spoke on 04/24/22 about this patient and a grant. I didn't see a note and wanted to check on the status of things. Thank you and I hope  you're having a wonderful day!" -Asked CCM team about [employer] grant and ability to use    COPD (Goal: control symptoms and prevent exacerbations) -Controlled     No data to display         Tobacco Use: Medium Risk (06/19/2022)   Patient History    Smoking Tobacco Use: Former    Smokeless Tobacco Use: Never    Passive Exposure: Not on file   -Current treatment  Albuterol Appropriate, Effective, Safe, Accessible Trelegy Appropriate, Effective, Safe, Query accessible PAP Started for 2023 on 04/22/22 -Medications previously tried: N/A  -Gold Grade: Unknown -Current COPD Classification:  Unknown -MMRC/CAT score:      No data to display         -Patient denies consistent use of maintenance inhaler -Counseled on [place] inhaler technique; November 2023: Stared PAP for Trelegy December 2023: Dapagliflozin PAP submitted at beginning of month. Sent msg to [facility] via teams to call company to check on status Jan 2024: Will have team call company to check on status   Cost of meds: Midrodine: $99.85 -$40 next time. Was credited Trelegy: 174 -PAP started Farxiga: 152 -PAP started Amiodarone: 14.88 Torsemide: 13.75 Warfarin: 10.75 Digoxin: 10.17 KCl: 7.17 Albuterol: 9.49  Artelia Laroche, Pharm.D. - [phone]

## 2022-06-21 ENCOUNTER — Encounter (HOSPITAL_COMMUNITY): Payer: Self-pay | Admitting: Internal Medicine

## 2022-06-22 ENCOUNTER — Other Ambulatory Visit (HOSPITAL_COMMUNITY): Payer: Self-pay | Admitting: Internal Medicine

## 2022-06-24 ENCOUNTER — Encounter: Payer: Self-pay | Admitting: Internal Medicine

## 2022-06-25 ENCOUNTER — Telehealth: Payer: Self-pay

## 2022-06-25 NOTE — Progress Notes (Signed)
Care Management & Coordination Services Pharmacy Team  Reason for Encounter: Medication coordination and delivery  Contacted patient on 06/25/2022 to discuss medications   Recent office visits:  None  Recent consult visits:  None  Hospital visits:  Medication Reconciliation was completed by comparing discharge summary, patient's EMR and Pharmacy list, and upon discussion with patient.   Admitted to the hospital on 02-20-2022 due to Fall encounter. Discharge date was 02-20-2022. Discharged from [place]?Medications Started at [facility] Discharge:?? None   Medication Changes at Hospital Discharge: None   Medications Discontinued at Hospital Discharge: None   Medications that remain the same after Hospital Discharge:??  -All other medications will remain the same.     Hospital visits:  Medication Reconciliation was completed by comparing discharge summary, patient's EMR and Pharmacy list, and upon discussion with patient.   Admitted to the hospital on 02-12-2022 due to Scalp laceration. Discharge date was 02-12-2022. Discharged from [place]?Medications Started at [facility] Discharge:?? None   Medication Changes at Hospital Discharge: None   Medications Discontinued at Hospital Discharge: None   Medications that remain the same after Hospital Discharge:??  -All other medications will remain the same.     Hospital visits:  Medication Reconciliation was completed by comparing discharge summary, patient's EMR and Pharmacy list, and upon discussion with patient.   Admitted to the hospital on 01-28-2022 due to Fall encounter. Discharge date was 01-28-2022. Discharged from [place]?Medications Started at [facility] Discharge:?? None   Medication Changes at Hospital Discharge: None   Medications Discontinued at Hospital Discharge: None   Medications that remain the same after Hospital Discharge:??  -All other  medications will remain the same.      Medications: Outpatient Encounter Medications as of 06/25/2022  Medication Sig Note   albuterol (PROVENTIL) (2.5 MG/3ML) 0.083% nebulizer solution Inhale 3 mLs into the lungs every 6 (six) hours as needed for wheezing or shortness of breath.    albuterol (VENTOLIN HFA) 108 (90 Base) MCG/ACT inhaler Inhale 2 puffs into the lungs every 6 (six) hours as needed for wheezing or shortness of breath.    amiodarone (PACERONE) 200 MG tablet Take 0.5 tablets (100 mg total) by mouth daily.    aspirin EC 81 MG tablet Take 1 tablet (81 mg total) by mouth daily. Swallow whole.    atorvastatin (LIPITOR) 80 MG tablet Take 1 tablet (80 mg total) by mouth daily.    cyanocobalamin (VITAMIN B12) 1000 MCG/ML injection Inject 1,000 mcg into the muscle every 30 (thirty) days.    dapagliflozin propanediol (FARXIGA) 10 MG TABS tablet Take 1 tablet (10 mg total) by mouth daily.    digoxin (LANOXIN) 0.125 MG tablet Take 0.5 tablets (0.0625 mg total) by mouth every other day. (Patient taking differently: Take 0.125 mg by mouth daily.)    escitalopram (LEXAPRO) 10 MG tablet Take 1 tablet (10 mg total) by mouth daily.    esomeprazole (NEXIUM) 40 MG capsule Take 40 mg by mouth daily.    ezetimibe (ZETIA) 10 MG tablet Take 0.5 tablets (5 mg total) by mouth daily.    Fluticasone-Umeclidin-Vilant (TRELEGY ELLIPTA) 100-62.5-25 MCG/ACT AEPB Inhale 1 puff into the lungs daily.    gabapentin (NEURONTIN) 600 MG tablet Take 1 tablet (600 mg total) by mouth at bedtime. (Patient taking differently: Take 600 mg by mouth at bedtime as needed (pain).)    Iron, Ferrous Sulfate, 325 (65 Fe) MG  TABS Take 325 mg by mouth daily.    latanoprost (XALATAN) 0.005 % ophthalmic solution Place 1 drop into both eyes at bedtime.    Melatonin 3 MG CAPS Take 3 mg by mouth at bedtime.    midodrine (PROAMATINE) 10 MG tablet Take 1 tablet (10 mg total) by mouth 3 (three) times daily.    nitroGLYCERIN (NITROSTAT) 0.4 MG  SL tablet Place 1 tablet (0.4 mg total) under the tongue every 5 (five) minutes as needed for chest pain.    OVER THE COUNTER MEDICATION Apply 1 application  topically at bedtime. Medication: Mag Maxx Cream Back and restless leg    potassium chloride SA (KLOR-CON M) 20 MEQ tablet TAKE TWO TABLETS BY MOUTH ONCE DAILY    spironolactone (ALDACTONE) 25 MG tablet Take 1 tablet (25 mg total) by mouth daily.    torsemide (DEMADEX) 20 MG tablet TAKE THREE TABLETS BY MOUTH ONCE DAILY    warfarin (COUMADIN) 1 MG tablet Take 2 tablets to 3 tablets by mouth daily as directed by [facility] 06/17/2022: Med on Hold for procedure 06/19/22   No facility-administered encounter medications on file as of 06/25/2022.   BP Readings from Last 3 Encounters:  06/19/22 95/71  06/11/22 (!) 140/90  05/15/22 108/68    Pulse Readings from Last 3 Encounters:  06/19/22 (!) 58  06/11/22 74  05/15/22 64    Lab Results  Component Value Date/Time   HGBA1C 6.0 (H) 03/18/2022 05:58 AM   HGBA1C 6.0 (H) 11/01/2021 04:30 AM   Lab Results  Component Value Date   CREATININE 1.93 (H) 06/11/2022   BUN 52 (H) 06/11/2022   GFRNONAA 32 (L) 05/15/2022   GFRAA 65 03/27/2020   NA 134 06/11/2022   K 4.1 06/11/2022   CALCIUM 9.0 06/11/2022   CO2 24 06/11/2022     Last adherence delivery date: 05-21-2022  Patient is due for next adherence delivery on: 07-07-2022  Spoke with patient on 06-25-2022 reviewed medications and coordinated delivery.  This delivery to include: Vials  30 Days  Amiodarone 200 mg Atorvastatin 80 mg Digoxin 0.125 mg Lexapro 10 mg Zetia 10 mg Gabapentin 600 mg Potassium chloride 20 meq Spironolactone 25 mg Torsemide 20 mg Warfarin 1 mg- No RX Aspirin 81 mg   Patient declined the following medications this month: Midodrine- PAP Proventil- Plenty supply Ventolin- plenty supply Trelegy- PAP in process Farxiga- PAP  Prescriptions requested: Warfarin- Sent message to [employer] that [facility] requested a call to transfer medication.  Confirmed delivery date of 07-07-2022, advised patient that pharmacy will contact them the morning of delivery.   Any concerns about your medications? No  How often do you forget or accidentally miss a dose? Never  Do you use a pillbox? Yes  Is patient in packaging No  Any concerns or issues with your packaging? No   Recent blood pressure readings are as follows: 95/71 06-19-2022  Recent blood glucose readings are as follows: 6.0 03-18-2022   Cycle dispensing form sent to Pattricia Boss for review.   Malecca Ishmael Holter

## 2022-06-25 NOTE — Telephone Encounter (Signed)
   Please call and increase torsemide to 60 mg twice a day x 3 days. Take an additional KDUR  x3 days.    Set up for HF follow up next week.    Aylinn Rydberg NP-C  5:19 PM

## 2022-06-26 ENCOUNTER — Ambulatory Visit (INDEPENDENT_AMBULATORY_CARE_PROVIDER_SITE_OTHER): Payer: Medicare HMO

## 2022-06-26 DIAGNOSIS — I480 Paroxysmal atrial fibrillation: Secondary | ICD-10-CM | POA: Diagnosis not present

## 2022-06-26 DIAGNOSIS — R1319 Other dysphagia: Secondary | ICD-10-CM | POA: Diagnosis not present

## 2022-06-26 DIAGNOSIS — Z7982 Long term (current) use of aspirin: Secondary | ICD-10-CM | POA: Diagnosis not present

## 2022-06-26 DIAGNOSIS — I252 Old myocardial infarction: Secondary | ICD-10-CM | POA: Diagnosis not present

## 2022-06-26 DIAGNOSIS — H409 Unspecified glaucoma: Secondary | ICD-10-CM | POA: Diagnosis not present

## 2022-06-26 DIAGNOSIS — I5023 Acute on chronic systolic (congestive) heart failure: Secondary | ICD-10-CM | POA: Diagnosis not present

## 2022-06-26 DIAGNOSIS — K222 Esophageal obstruction: Secondary | ICD-10-CM | POA: Diagnosis not present

## 2022-06-26 DIAGNOSIS — Z9181 History of falling: Secondary | ICD-10-CM | POA: Diagnosis not present

## 2022-06-26 DIAGNOSIS — Z952 Presence of prosthetic heart valve: Secondary | ICD-10-CM | POA: Diagnosis not present

## 2022-06-26 DIAGNOSIS — Z7901 Long term (current) use of anticoagulants: Secondary | ICD-10-CM

## 2022-06-26 DIAGNOSIS — N1832 Chronic kidney disease, stage 3b: Secondary | ICD-10-CM | POA: Diagnosis not present

## 2022-06-26 DIAGNOSIS — Z951 Presence of aortocoronary bypass graft: Secondary | ICD-10-CM | POA: Diagnosis not present

## 2022-06-26 DIAGNOSIS — J449 Chronic obstructive pulmonary disease, unspecified: Secondary | ICD-10-CM | POA: Diagnosis not present

## 2022-06-26 DIAGNOSIS — M1990 Unspecified osteoarthritis, unspecified site: Secondary | ICD-10-CM | POA: Diagnosis not present

## 2022-06-26 DIAGNOSIS — Z954 Presence of other heart-valve replacement: Secondary | ICD-10-CM | POA: Diagnosis not present

## 2022-06-26 DIAGNOSIS — Z87891 Personal history of nicotine dependence: Secondary | ICD-10-CM | POA: Diagnosis not present

## 2022-06-26 DIAGNOSIS — I2510 Atherosclerotic heart disease of native coronary artery without angina pectoris: Secondary | ICD-10-CM | POA: Diagnosis not present

## 2022-06-26 DIAGNOSIS — I350 Nonrheumatic aortic (valve) stenosis: Secondary | ICD-10-CM | POA: Diagnosis not present

## 2022-06-26 DIAGNOSIS — K219 Gastro-esophageal reflux disease without esophagitis: Secondary | ICD-10-CM | POA: Diagnosis not present

## 2022-06-26 DIAGNOSIS — E785 Hyperlipidemia, unspecified: Secondary | ICD-10-CM | POA: Diagnosis not present

## 2022-06-26 DIAGNOSIS — D509 Iron deficiency anemia, unspecified: Secondary | ICD-10-CM | POA: Diagnosis not present

## 2022-06-26 LAB — POCT INR: INR: 1.4 — AB (ref 2.0–3.0)

## 2022-06-26 NOTE — Progress Notes (Cosign Needed)
Not sure about the OOP expense but I will have [name] look today while she is at [facility]. Patient called yesterday for midodrine and they are processing shipment the copayment is 30 for 90 DS

## 2022-06-26 NOTE — Patient Instructions (Signed)
Description   Spoke with Colletta Maryland, RN and Keith Jenkins. Instructed for Keith Jenkins to take 3.5 tablets today and 3.5 tablets tomorrow and then continue taking 3 tablets daily.  Recheck INR in 1 week.  [facility] [phone] Fax Clearance form to [phone]

## 2022-07-01 ENCOUNTER — Ambulatory Visit (INDEPENDENT_AMBULATORY_CARE_PROVIDER_SITE_OTHER): Payer: Medicare HMO

## 2022-07-01 ENCOUNTER — Telehealth: Payer: Self-pay

## 2022-07-01 ENCOUNTER — Telehealth: Payer: Medicare HMO

## 2022-07-01 DIAGNOSIS — I5043 Acute on chronic combined systolic (congestive) and diastolic (congestive) heart failure: Secondary | ICD-10-CM

## 2022-07-01 DIAGNOSIS — I10 Essential (primary) hypertension: Secondary | ICD-10-CM

## 2022-07-01 DIAGNOSIS — I5021 Acute systolic (congestive) heart failure: Secondary | ICD-10-CM

## 2022-07-01 DIAGNOSIS — N1832 Chronic kidney disease, stage 3b: Secondary | ICD-10-CM

## 2022-07-01 NOTE — Telephone Encounter (Signed)
   Telephone encounter was:  Unsuccessful.  07/01/2022 Name: Keith Jenkins MRN: [id number] DOB: [birth year]  Unsuccessful outbound call made today to assist with:  Food Insecurity  Outreach Attempt:  1st Attempt  Unable to [employer]. Someone picked up but never would answer   [place]  [phone] [address], [place], [place] [address] Phone: [phone] Email: [email or web address]

## 2022-07-01 NOTE — Chronic Care Management (AMB) (Signed)
Chronic Care Management   CCM RN Visit Note  07/01/2022 Name: Keith Jenkins MRN: [id number] DOB: 03-04-1946  Subjective: Keith Jenkins is a 77 y.o. year old male who is a primary care patient of Rip Harbour, NP. The patient was referred to the Chronic Care Management team for assistance with care management needs subsequent to provider initiation of CCM services and plan of care.    Today's Visit:  Engaged with patient by telephone for follow up visit.        Goals Addressed             This Visit's Progress    CCM (KIDNEY FAILURE)  EXPECTED OUTCOME:  MONITOR, SELF-MANAGE AND REDUCE SYMPTOMS OF KIDNEY FAILURE       Current Barriers:  Knowledge Deficits related to the importance of normalized blood pressures, compliance with medications, and monitoring for changes in the patients urinary and kidney function Care Coordination needs related to financial constraints and the need for assistance with basic needs for health and well being  in a patient with CKD Chronic Disease Management support and education needs related to effective management of CKD Lacks caregiver support.  Film/video editor.   Planned Interventions: Assessed the patients wife, Keith Jenkins  understanding of chronic kidney disease    Evaluation of current treatment plan related to chronic kidney disease self management and patient's adherence to plan as established by provider. The patient is compliant with the plan of care. Sees providers on a regular basis.  Saw pcp on 06-11-2022    Provided education to patient re: stroke prevention, s/s of heart attack and stroke    Reviewed prescribed diet heart healthy. The patient is compliant with dietary restrictions. States that he has a good appetite. Is eating a product called "luna life" a broth that has 10 grams of protein in it. Education on monitoring sodium content of foods like broth. The patient has had an improvement in his appetite and eating habits since having his  procedure recently. States he can swallow a lot better now. Reviewed medications with patient and discussed importance of compliance. The patient is compliant with medications and works with the pharm D on a regular basis for help with medications cost.     Advised patient, providing education and rationale, to monitor blood pressure daily and record, calling PCP for findings outside established parameters    Discussed complications of poorly controlled blood pressure such as heart disease, stroke, circulatory complications, vision complications, kidney impairment, sexual dysfunction    Reviewed scheduled/upcoming provider appointments including: 09-12-2022 at 27 am.    Advised patient to discuss changes in kidney function with provider    Discussed plans with patient for ongoing care management follow up and provided patient with direct contact information for care management team    Screening for signs and symptoms of depression related to chronic disease state      Discussed the impact of chronic kidney disease on daily life and mental health and acknowledged and normalized feelings of disempowerment, fear, and frustration    Assessed social determinant of health barriers    Provided education on kidney disease progression    Engage patient in early, proactive and ongoing discussion about goals of care and what matters most to them    Support coping and stress management by recognizing current strategies and assist in developing new strategies such as mindfulness, journaling, relaxation techniques, problem-solving     Symptom Management: Take medications as prescribed   Attend all scheduled provider  appointments Call provider office for new concerns or questions  call the Suicide and Crisis Lifeline: 988 call the [place] National Suicide Prevention Lifeline: 515-376-2819 or TTY: (832)626-6976 TTY 231-701-1799) to talk to a trained counselor call 1-800-273-TALK (toll free, 24 hour hotline) if  experiencing a Mental Health or Behavioral Health Crisis   Follow Up Plan: Telephone follow up appointment with care management team member scheduled for: 09-03-2022 at 1145 am       CCM Expected Outcome:  Monitor, Self-Manage and Reduce Symptoms of Heart Failure       Current Barriers:  Knowledge Deficits related to the importance of monitoring for sx and sx of heart failure and to prevent exacerbations Care Coordination needs related to resources needed to maintain health and well being  in a patient with CHF and other chronic conditions Chronic Disease Management support and education needs related to CHF Lacks caregiver support.  Corporate treasurer.   Planned Interventions: Basic overview and discussion of pathophysiology of Heart Failure reviewed Provided education on low sodium diet. Review and education provided about watching hidden sodium in foods. Education on reading labels.  Reviewed Heart Failure Action Plan in depth and provided written copy Assessed need for readable accurate scales in home Provided education about placing scale on hard, flat surface Advised patient to weigh each morning after emptying bladder.  Discussed importance of daily weight and advised patient to weigh and record daily. The patients wife states he is at his dry weight of 178.  Reviewed role of diuretics in prevention of fluid overload and management of heart failure. The patient is compliant with his medications Discussed the importance of keeping all appointments with provider. Next follow up with the pcp 09-12-2022 at 1020 am New referral made to community resources care guide team for assistance with resources in his area to help with healthy food options, and help with financial burdens. Also an appointment scheduled with the BSW for assistance with resources to help with financial constraints.  Advised patient to discuss changes in edema, swelling, or other questions and concerns related to Heart  Health with provider Screening for signs and symptoms of depression related to chronic disease state  Assessed social determinant of health barriers The patient is sleeping better at night. He states last night was not a good night but that he is sleeping much better and feeling much better.   Symptom Management: Take medications as prescribed   Attend all scheduled provider appointments Call provider office for new concerns or questions  Work with the social worker to address care coordination needs and will continue to work with the clinical team to address health care and disease management related needs call the Suicide and Crisis Lifeline: 988 call the [place] National Suicide Prevention Lifeline: 215 560 3203 or TTY: 520-069-6964 TTY (530) 861-0547) to talk to a trained counselor call 1-800-273-TALK (toll free, 24 hour hotline) if experiencing a Mental Health or Behavioral Health Crisis  call office if I gain more than 2 pounds in one day or 5 pounds in one week keep legs up while sitting track weight in diary use salt in moderation watch for swelling in feet, ankles and legs every day weigh myself daily begin a heart failure diary bring diary to all appointments develop a rescue plan follow rescue plan if symptoms flare-up track symptoms and what helps feel better or worse dress right for the weather, hot or cold  Follow Up Plan: Telephone follow up appointment with care management team member scheduled for: 09-03-2022 at  1145 am       CCM Expected Outcome:  Monitor, Self-Manage, and Reduce Symptoms of Hypertension       Current Barriers:  Knowledge Deficits related to the importance of normalized blood pressures and monitoring for changes in blood pressures in a patient with HTN and also hypotension at times  Care Coordination needs related to financial needs and resources  in a patient with HTN and other chronic conditions Chronic Disease Management support and education needs  related to effective management of HTN Lacks caregiver support.  Corporate treasurer.   BP Readings from Last 3 Encounters:  06/19/22 95/71  06/11/22 (!) 140/90  05/15/22 108/68   Self reported blood pressure this am was 110/60 Planned Interventions: Evaluation of current treatment plan related to hypertension self management and patient's adherence to plan as established by provider. The patients blood pressures have been good. Review of changing position slowly to prevent orthostatic hypotension. Review of safety and falls prevention;   Provided education to patient re: stroke prevention, s/s of heart attack and stroke; Reviewed prescribed diet heart healthy. The patient is eating better since having his procedure and he can swallow better, education and support given. Reviewed medications with patient and discussed importance of compliance. States compliance with medications;  Discussed plans with patient for ongoing care management follow up and provided patient with direct contact information for care management team; Advised patient, providing education and rationale, to monitor blood pressure daily and record, calling PCP for findings outside established parameters. The patients wife states that his blood pressures have been running better at 110's over 60's. She states it has been as low as 89/60. Review of monitoring for falls and safety concerns and light headedness or dizziness. The patient has a history of frequent falls Reviewed scheduled/upcoming provider appointments including: 09-12-2022 at 1020 am with pcp Advised patient to discuss changes in HTN or heart health with provider; Provided education on prescribed diet hear healthy diet, also a care guide referral placed for assistance with food resources due to financial difficulties. New care guide referral placed due to the patient needing additional resources for food and food banks in the area ;  Discussed complications of poorly  controlled blood pressure such as heart disease, stroke, circulatory complications, vision complications, kidney impairment, sexual dysfunction;  Screening for signs and symptoms of depression related to chronic disease state;  Assessed social determinant of health barriers;   Symptom Management: Take medications as prescribed   Attend all scheduled provider appointments Call provider office for new concerns or questions  Work with the social worker to address care coordination needs and will continue to work with the clinical team to address health care and disease management related needs call the Suicide and Crisis Lifeline: 988 call the [place] National Suicide Prevention Lifeline: (318) 523-6585 or TTY: 2103741776 TTY 253 213 6086) to talk to a trained counselor call 1-800-273-TALK (toll free, 24 hour hotline) if experiencing a Mental Health or Behavioral Health Crisis  check blood pressure 3 times per week write blood pressure results in a log or diary learn about high blood pressure keep a blood pressure log take blood pressure log to all doctor appointments call doctor for signs and symptoms of high blood pressure develop an action plan for high blood pressure keep all doctor appointments take medications for blood pressure exactly as prescribed report new symptoms to your doctor  Follow Up Plan: Telephone follow up appointment with care management team member scheduled for: 09-03-2022 at 1145 am  Plan:Telephone follow up appointment with care management team member scheduled for:  09-03-2022 at 20 am  Noreene Larsson RN, MSN, CCM RN Care Manager  Chronic Care Management Direct Number: [phone]

## 2022-07-01 NOTE — Patient Instructions (Signed)
Please call the care guide team at [phone] if you need to cancel or reschedule your appointment.   If you are experiencing a Mental Health or [place] or need someone to talk to, please call the Suicide and Crisis Lifeline: 988 call the [place] National Suicide Prevention Lifeline: 517-213-5208 or TTY: 657-616-7056 TTY 2487409159) to talk to a trained counselor call 1-800-273-TALK (toll free, 24 hour hotline)   Following is a copy of the CCM Program Consent:  CCM service includes personalized support from designated clinical staff supervised by the physician, including individualized plan of care and coordination with other care providers 24/7 contact phone numbers for assistance for urgent and routine care needs. Service will only be billed when office clinical staff spend 20 minutes or more in a month to coordinate care. Only one practitioner may furnish and bill the service in a calendar month. The patient may stop CCM services at amy time (effective at the end of the month) by phone call to the office staff. The patient will be responsible for cost sharing (co-pay) or up to 20% of the service fee (after annual deductible is met)  Following is a copy of your full provider care plan:   Goals Addressed             This Visit's Progress    CCM (KIDNEY FAILURE)  EXPECTED OUTCOME:  MONITOR, SELF-MANAGE AND REDUCE SYMPTOMS OF KIDNEY FAILURE       Current Barriers:  Knowledge Deficits related to the importance of normalized blood pressures, compliance with medications, and monitoring for changes in the patients urinary and kidney function Care Coordination needs related to financial constraints and the need for assistance with basic needs for health and well being  in a patient with CKD Chronic Disease Management support and education needs related to effective management of CKD Lacks caregiver support.  Film/video editor.   Planned Interventions: Assessed the  patients wife, [name]  understanding of chronic kidney disease    Evaluation of current treatment plan related to chronic kidney disease self management and patient's adherence to plan as established by provider. The patient is compliant with the plan of care. Sees providers on a regular basis.  Saw pcp on 06-11-2022    Provided education to patient re: stroke prevention, s/s of heart attack and stroke    Reviewed prescribed diet heart healthy. The patient is compliant with dietary restrictions. States that he has a good appetite. Is eating a product called "luna life" a broth that has 10 grams of protein in it. Education on monitoring sodium content of foods like broth. The patient has had an improvement in his appetite and eating habits since having his procedure recently. States he can swallow a lot better now. Reviewed medications with patient and discussed importance of compliance. The patient is compliant with medications and works with the pharm D on a regular basis for help with medications cost.     Advised patient, providing education and rationale, to monitor blood pressure daily and record, calling PCP for findings outside established parameters    Discussed complications of poorly controlled blood pressure such as heart disease, stroke, circulatory complications, vision complications, kidney impairment, sexual dysfunction    Reviewed scheduled/upcoming provider appointments including: 09-12-2022 at 26 am.    Advised patient to discuss changes in kidney function with provider    Discussed plans with patient for ongoing care management follow up and provided patient with direct contact information for care management team  Screening for signs and symptoms of depression related to chronic disease state      Discussed the impact of chronic kidney disease on daily life and mental health and acknowledged and normalized feelings of disempowerment, fear, and frustration    Assessed social determinant  of health barriers    Provided education on kidney disease progression    Engage patient in early, proactive and ongoing discussion about goals of care and what matters most to them    Support coping and stress management by recognizing current strategies and assist in developing new strategies such as mindfulness, journaling, relaxation techniques, problem-solving     Symptom Management: Take medications as prescribed   Attend all scheduled provider appointments Call provider office for new concerns or questions  call the Suicide and Crisis Lifeline: 988 call the [place] National Suicide Prevention Lifeline: 587-746-6670 or TTY: 770-502-3361 TTY (479)046-1697) to talk to a trained counselor call 1-800-273-TALK (toll free, 24 hour hotline) if experiencing a Mental Health or Behavioral Health Crisis   Follow Up Plan: Telephone follow up appointment with care management team member scheduled for: 09-03-2022 at 1145 am       CCM Expected Outcome:  Monitor, Self-Manage and Reduce Symptoms of Heart Failure       Current Barriers:  Knowledge Deficits related to the importance of monitoring for sx and sx of heart failure and to prevent exacerbations Care Coordination needs related to resources needed to maintain health and well being  in a patient with CHF and other chronic conditions Chronic Disease Management support and education needs related to CHF Lacks caregiver support.  Corporate treasurer.   Planned Interventions: Basic overview and discussion of pathophysiology of Heart Failure reviewed Provided education on low sodium diet. Review and education provided about watching hidden sodium in foods. Education on reading labels.  Reviewed Heart Failure Action Plan in depth and provided written copy Assessed need for readable accurate scales in home Provided education about placing scale on hard, flat surface Advised patient to weigh each morning after emptying bladder.  Discussed  importance of daily weight and advised patient to weigh and record daily. The patients wife states he is at his dry weight of 178.  Reviewed role of diuretics in prevention of fluid overload and management of heart failure. The patient is compliant with his medications Discussed the importance of keeping all appointments with provider. Next follow up with the pcp 09-12-2022 at 1020 am New referral made to community resources care guide team for assistance with resources in his area to help with healthy food options, and help with financial burdens. Also an appointment scheduled with the BSW for assistance with resources to help with financial constraints.  Advised patient to discuss changes in edema, swelling, or other questions and concerns related to Heart Health with provider Screening for signs and symptoms of depression related to chronic disease state  Assessed social determinant of health barriers The patient is sleeping better at night. He states last night was not a good night but that he is sleeping much better and feeling much better.   Symptom Management: Take medications as prescribed   Attend all scheduled provider appointments Call provider office for new concerns or questions  Work with the social worker to address care coordination needs and will continue to work with the clinical team to address health care and disease management related needs call the Suicide and Crisis Lifeline: 988 call the [place] National Suicide Prevention Lifeline: 902-145-5032 or TTY: 786-560-5946 TTY 939-841-0613) to  talk to a trained counselor call 1-800-273-TALK (toll free, 24 hour hotline) if experiencing a Mental Health or Behavioral Health Crisis  call office if I gain more than 2 pounds in one day or 5 pounds in one week keep legs up while sitting track weight in diary use salt in moderation watch for swelling in feet, ankles and legs every day weigh myself daily begin a heart failure  diary bring diary to all appointments develop a rescue plan follow rescue plan if symptoms flare-up track symptoms and what helps feel better or worse dress right for the weather, hot or cold  Follow Up Plan: Telephone follow up appointment with care management team member scheduled for: 09-03-2022 at 1145 am       CCM Expected Outcome:  Monitor, Self-Manage, and Reduce Symptoms of Hypertension       Current Barriers:  Knowledge Deficits related to the importance of normalized blood pressures and monitoring for changes in blood pressures in a patient with HTN and also hypotension at times  Care Coordination needs related to financial needs and resources  in a patient with HTN and other chronic conditions Chronic Disease Management support and education needs related to effective management of HTN Lacks caregiver support.  Corporate treasurer.   BP Readings from Last 3 Encounters:  06/19/22 95/71  06/11/22 (!) 140/90  05/15/22 108/68   Self reported blood pressure this am was 110/60 Planned Interventions: Evaluation of current treatment plan related to hypertension self management and patient's adherence to plan as established by provider. The patients blood pressures have been good. Review of changing position slowly to prevent orthostatic hypotension. Review of safety and falls prevention;   Provided education to patient re: stroke prevention, s/s of heart attack and stroke; Reviewed prescribed diet heart healthy. The patient is eating better since having his procedure and he can swallow better, education and support given. Reviewed medications with patient and discussed importance of compliance. States compliance with medications;  Discussed plans with patient for ongoing care management follow up and provided patient with direct contact information for care management team; Advised patient, providing education and rationale, to monitor blood pressure daily and record, calling PCP for  findings outside established parameters. The patients wife states that his blood pressures have been running better at 110's over 60's. She states it has been as low as 89/60. Review of monitoring for falls and safety concerns and light headedness or dizziness. The patient has a history of frequent falls Reviewed scheduled/upcoming provider appointments including: 09-12-2022 at 1020 am with pcp Advised patient to discuss changes in HTN or heart health with provider; Provided education on prescribed diet hear healthy diet, also a care guide referral placed for assistance with food resources due to financial difficulties. New care guide referral placed due to the patient needing additional resources for food and food banks in the area ;  Discussed complications of poorly controlled blood pressure such as heart disease, stroke, circulatory complications, vision complications, kidney impairment, sexual dysfunction;  Screening for signs and symptoms of depression related to chronic disease state;  Assessed social determinant of health barriers;   Symptom Management: Take medications as prescribed   Attend all scheduled provider appointments Call provider office for new concerns or questions  Work with the social worker to address care coordination needs and will continue to work with the clinical team to address health care and disease management related needs call the Suicide and Crisis Lifeline: 988 call the [place] National Suicide Prevention Lifeline:  (906)247-9316 or TTY: 706-268-9100 TTY (475) 467-1949) to talk to a trained counselor call 1-800-273-TALK (toll free, 24 hour hotline) if experiencing a Mental Health or [place]  check blood pressure 3 times per week write blood pressure results in a log or diary learn about high blood pressure keep a blood pressure log take blood pressure log to all doctor appointments call doctor for signs and symptoms of high blood pressure develop  an action plan for high blood pressure keep all doctor appointments take medications for blood pressure exactly as prescribed report new symptoms to your doctor  Follow Up Plan: Telephone follow up appointment with care management team member scheduled for: 09-03-2022 at 1145 am          Patient verbalizes understanding of instructions and care plan provided today and agrees to view in [place]. Active MyChart status and patient understanding of how to access instructions and care plan via MyChart confirmed with patient.     Telephone follow up appointment with care management team member scheduled for: 09-03-2022 at 1145 am

## 2022-07-03 ENCOUNTER — Ambulatory Visit (INDEPENDENT_AMBULATORY_CARE_PROVIDER_SITE_OTHER): Payer: Medicare HMO | Admitting: *Deleted

## 2022-07-03 ENCOUNTER — Telehealth: Payer: Self-pay

## 2022-07-03 ENCOUNTER — Telehealth: Payer: Medicare HMO | Admitting: Family Medicine

## 2022-07-03 DIAGNOSIS — I480 Paroxysmal atrial fibrillation: Secondary | ICD-10-CM | POA: Diagnosis not present

## 2022-07-03 DIAGNOSIS — Z952 Presence of prosthetic heart valve: Secondary | ICD-10-CM | POA: Diagnosis not present

## 2022-07-03 DIAGNOSIS — Z9181 History of falling: Secondary | ICD-10-CM | POA: Diagnosis not present

## 2022-07-03 DIAGNOSIS — Z951 Presence of aortocoronary bypass graft: Secondary | ICD-10-CM | POA: Diagnosis not present

## 2022-07-03 DIAGNOSIS — I2510 Atherosclerotic heart disease of native coronary artery without angina pectoris: Secondary | ICD-10-CM | POA: Diagnosis not present

## 2022-07-03 DIAGNOSIS — K222 Esophageal obstruction: Secondary | ICD-10-CM | POA: Diagnosis not present

## 2022-07-03 DIAGNOSIS — I252 Old myocardial infarction: Secondary | ICD-10-CM | POA: Diagnosis not present

## 2022-07-03 DIAGNOSIS — Z954 Presence of other heart-valve replacement: Secondary | ICD-10-CM | POA: Diagnosis not present

## 2022-07-03 DIAGNOSIS — Z87891 Personal history of nicotine dependence: Secondary | ICD-10-CM | POA: Diagnosis not present

## 2022-07-03 DIAGNOSIS — R1319 Other dysphagia: Secondary | ICD-10-CM | POA: Diagnosis not present

## 2022-07-03 DIAGNOSIS — Z7901 Long term (current) use of anticoagulants: Secondary | ICD-10-CM

## 2022-07-03 DIAGNOSIS — M5412 Radiculopathy, cervical region: Secondary | ICD-10-CM | POA: Diagnosis not present

## 2022-07-03 DIAGNOSIS — Z7982 Long term (current) use of aspirin: Secondary | ICD-10-CM | POA: Diagnosis not present

## 2022-07-03 DIAGNOSIS — D509 Iron deficiency anemia, unspecified: Secondary | ICD-10-CM | POA: Diagnosis not present

## 2022-07-03 DIAGNOSIS — I5023 Acute on chronic systolic (congestive) heart failure: Secondary | ICD-10-CM | POA: Diagnosis not present

## 2022-07-03 DIAGNOSIS — J449 Chronic obstructive pulmonary disease, unspecified: Secondary | ICD-10-CM | POA: Diagnosis not present

## 2022-07-03 DIAGNOSIS — I350 Nonrheumatic aortic (valve) stenosis: Secondary | ICD-10-CM | POA: Diagnosis not present

## 2022-07-03 DIAGNOSIS — H409 Unspecified glaucoma: Secondary | ICD-10-CM | POA: Diagnosis not present

## 2022-07-03 DIAGNOSIS — E785 Hyperlipidemia, unspecified: Secondary | ICD-10-CM | POA: Diagnosis not present

## 2022-07-03 DIAGNOSIS — N1832 Chronic kidney disease, stage 3b: Secondary | ICD-10-CM | POA: Diagnosis not present

## 2022-07-03 DIAGNOSIS — K219 Gastro-esophageal reflux disease without esophagitis: Secondary | ICD-10-CM | POA: Diagnosis not present

## 2022-07-03 DIAGNOSIS — M1990 Unspecified osteoarthritis, unspecified site: Secondary | ICD-10-CM | POA: Diagnosis not present

## 2022-07-03 LAB — POCT INR: INR: 2.1 (ref 2.0–3.0)

## 2022-07-03 NOTE — Telephone Encounter (Signed)
   Telephone encounter was:  Successful.  07/03/2022 Name: Keith Jenkins MRN: [id number] DOB: 04-27-1946  Keith Jenkins is a 77 y.o. year old male who is a primary care patient of Rip Harbour, NP . The community resource team was consulted for assistance with [place] guide performed the following interventions: Patient provided with information about care guide support team and interviewed to confirm resource needs. Daughter stated Patient needs food and utility assistance. They are having financial strain and cant afford food and getting behind on utilities   Follow Up Plan:  No further follow up planned at this time. The patient has been provided with needed resources.    [place], Care Management  [phone] [address], [place], [place] [address] Phone: [phone] Email: [email or web address]

## 2022-07-03 NOTE — Patient Instructions (Signed)
Description   Spoke with Colletta [place], [facility] and pt to instructed for pt to continue taking 3 tablets daily.  Recheck INR in 1 week.  [facility] [phone] Fax Clearance form to [phone]

## 2022-07-04 ENCOUNTER — Ambulatory Visit: Payer: Self-pay

## 2022-07-04 DIAGNOSIS — I350 Nonrheumatic aortic (valve) stenosis: Secondary | ICD-10-CM | POA: Diagnosis not present

## 2022-07-04 DIAGNOSIS — Z7982 Long term (current) use of aspirin: Secondary | ICD-10-CM | POA: Diagnosis not present

## 2022-07-04 DIAGNOSIS — H409 Unspecified glaucoma: Secondary | ICD-10-CM | POA: Diagnosis not present

## 2022-07-04 DIAGNOSIS — K219 Gastro-esophageal reflux disease without esophagitis: Secondary | ICD-10-CM | POA: Diagnosis not present

## 2022-07-04 DIAGNOSIS — Z951 Presence of aortocoronary bypass graft: Secondary | ICD-10-CM | POA: Diagnosis not present

## 2022-07-04 DIAGNOSIS — I480 Paroxysmal atrial fibrillation: Secondary | ICD-10-CM | POA: Diagnosis not present

## 2022-07-04 DIAGNOSIS — N1832 Chronic kidney disease, stage 3b: Secondary | ICD-10-CM | POA: Diagnosis not present

## 2022-07-04 DIAGNOSIS — K222 Esophageal obstruction: Secondary | ICD-10-CM | POA: Diagnosis not present

## 2022-07-04 DIAGNOSIS — I2510 Atherosclerotic heart disease of native coronary artery without angina pectoris: Secondary | ICD-10-CM | POA: Diagnosis not present

## 2022-07-04 DIAGNOSIS — Z87891 Personal history of nicotine dependence: Secondary | ICD-10-CM | POA: Diagnosis not present

## 2022-07-04 DIAGNOSIS — J449 Chronic obstructive pulmonary disease, unspecified: Secondary | ICD-10-CM | POA: Diagnosis not present

## 2022-07-04 DIAGNOSIS — M1990 Unspecified osteoarthritis, unspecified site: Secondary | ICD-10-CM | POA: Diagnosis not present

## 2022-07-04 DIAGNOSIS — H34831 Tributary (branch) retinal vein occlusion, right eye, with macular edema: Secondary | ICD-10-CM | POA: Diagnosis not present

## 2022-07-04 DIAGNOSIS — H401131 Primary open-angle glaucoma, bilateral, mild stage: Secondary | ICD-10-CM | POA: Diagnosis not present

## 2022-07-04 DIAGNOSIS — I252 Old myocardial infarction: Secondary | ICD-10-CM | POA: Diagnosis not present

## 2022-07-04 DIAGNOSIS — E785 Hyperlipidemia, unspecified: Secondary | ICD-10-CM | POA: Diagnosis not present

## 2022-07-04 DIAGNOSIS — R1319 Other dysphagia: Secondary | ICD-10-CM | POA: Diagnosis not present

## 2022-07-04 DIAGNOSIS — Z954 Presence of other heart-valve replacement: Secondary | ICD-10-CM | POA: Diagnosis not present

## 2022-07-04 DIAGNOSIS — Z9181 History of falling: Secondary | ICD-10-CM | POA: Diagnosis not present

## 2022-07-04 DIAGNOSIS — Z7901 Long term (current) use of anticoagulants: Secondary | ICD-10-CM | POA: Diagnosis not present

## 2022-07-04 DIAGNOSIS — I5023 Acute on chronic systolic (congestive) heart failure: Secondary | ICD-10-CM | POA: Diagnosis not present

## 2022-07-04 DIAGNOSIS — D509 Iron deficiency anemia, unspecified: Secondary | ICD-10-CM | POA: Diagnosis not present

## 2022-07-04 NOTE — Patient Instructions (Signed)
Visit Information  Thank you for taking time to visit with me today. Please don't hesitate to contact me if I can be of assistance to you.   Following are the goals we discussed today:   Goals Addressed             This Visit's Progress    Care Coordination Activities       Care Coordination Interventions: SDoH screening completed - identified challenges with food insecurity, home modification needs, and financial strain Home Modification Needs Discussed patient is unable to safely access his bathtub due to extreme high fall risk and inability to step into bathtub. There also seems to be plumbing concerns with the faucet Reported sagging floor in kitchen which patients spouse is fearful may lead to another fall for the patient Performed chart review to note patient was referred to [place] program in November via [id number]. Patient referral was accepted Determined Mrs. Huynh did speak with someone from [facility] by phone but never heard back Voice message left for Donnamae Jude program director with [facility] program requesting a return call to discuss the status of patients referral Financial Strain Discussed the patient has been in and out of the hospital several times this past year which has resulted in a large amount of medical debt Performed chart review to note record of 5 inpatients stays and 3 ED visits. One inpatient stay resulted in a 25 day hospitalization Determined patient has applied for Medicaid but was declined due to combined spousal income Confirmed patient is working with [facility] team to assist with patient assistance with medication costs Advised SW will look into available resource options  Food Insecurity Determined that due to financial strain, patient is having difficulty affording the cost of food; spouse confirms there to be a loaf of bread and eggs in the home Discussed plan for SW to work on  linking to resources to address food insecurity in the home [place] on Wheels program - voice message left requesting a return call in order to place patient on waiting list Referral sent to [facility] to request Moms Meals be delivered to the home. Requested 14 heart health meals per month over the next 6 months Received confirmation referral approved and meals will be delivered in 1-3 business days; contact spouse to advise to expect this meal delivery Seeking further resources to assist; advised spouse will outreach again over the next week          If you are experiencing a [place] or [place] or need someone to talk to, please call 911  Patient verbalizes understanding of instructions and care plan provided today and agrees to view in [place]. Active MyChart status and patient understanding of how to access instructions and care plan via MyChart confirmed with patient.     [name], BSW, CDP Social Worker, Certified Dementia Practitioner [place] [facility]  Care Coordination [phone]

## 2022-07-04 NOTE — Patient Outreach (Signed)
  Care Coordination   Follow Up Visit Note   07/04/2022 Name: Keith Jenkins MRN: [id number] DOB: 08/04/1945  Keith Jenkins is a 77 y.o. year old male who sees [place], Thornell Mule, NP for primary care. I  spoke with patients spouse and caregiver Arbie Cookey by phone.  What matters to the patients health and wellness today?  Resource linking to assist with home repairs and food insecurity.    Goals Addressed             This Visit's Progress    Care Coordination Activities       Care Coordination Interventions: SDoH screening completed - identified challenges with food insecurity, home modification needs, and financial strain Home Modification Needs Discussed patient is unable to safely access his bathtub due to extreme high fall risk and inability to step into bathtub. There also seems to be plumbing concerns with the faucet Reported sagging floor in kitchen which patients spouse is fearful may lead to another fall for the patient Performed chart review to note patient was referred to [place] program in November via [id number]. Patient referral was accepted Determined Mrs. Faircloth did speak with someone from [facility] by phone but never heard back Voice message left for Donnamae Jude program director with [facility] program requesting a return call to discuss the status of patients referral Financial Strain Discussed the patient has been in and out of the hospital several times this past year which has resulted in a large amount of medical debt Performed chart review to note record of 5 inpatients stays and 3 ED visits. One inpatient stay resulted in a 25 day hospitalization Determined patient has applied for Medicaid but was declined due to combined spousal income Confirmed patient is working with [facility] team to assist with patient assistance with medication costs Advised SW will look into available resource options  Food  Insecurity Determined that due to financial strain, patient is having difficulty affording the cost of food; spouse confirms there to be a loaf of bread and eggs in the home Discussed plan for SW to work on linking to resources to address food insecurity in the home [place] on Wheels program - voice message left requesting a return call in order to place patient on waiting list Referral sent to [facility] to request Moms Meals be delivered to the home. Requested 14 heart health meals per month over the next 6 months Received confirmation referral approved and meals will be delivered in 1-3 business days; contact spouse to advise to expect this meal delivery Seeking further resources to assist; advised spouse will outreach again over the next week         SDOH assessments and interventions completed:  Yes  SDOH Interventions Today    Flowsheet Row Most Recent Value  SDOH Interventions   Food Insecurity Interventions Other (Comment)  [Moms Meals, referral to meals on wheels]  Housing Interventions Other (Comment)  [Bathroom modifications and floor repairs needed - referred to [facility] in Nov 2023, vm left requesting status of referral]  Financial Strain Interventions Other (Comment)  [Request assistance with [place] Coordination Interventions:  Yes, provided   Follow up plan:  SW will continue to follow    Encounter Outcome:  Pt. Visit Completed   [name], Arita Miss, CDP Social Worker, Certified Dementia Practitioner [facility] Care Management  Care Coordination [phone]

## 2022-07-07 ENCOUNTER — Ambulatory Visit: Payer: Self-pay

## 2022-07-07 NOTE — Patient Instructions (Signed)
Visit Information  Thank you for taking time to visit with me today. Please don't hesitate to contact me if I can be of assistance to you.   Following are the goals we discussed today:   Goals Addressed             This Visit's Progress    Care Coordination Activities       Care Coordination Interventions: Unsuccessful outbound call placed to [place] to place patient on the wait list for meals on wheels Secure email sent to Horton Chin, Nutrition Director at [email or web address] referring patient to the program Obtain $50 gift card from [facility] which will be mailed to the patients home address Voice message left for [address] with [place] program requesting a return call to discuss the status of patients previous referral for home modifications        If you are experiencing a Mental Health or [place] or need someone to talk to, please call [place], BSW, CDP Social Worker, Certified Dementia Practitioner [place] [phone]

## 2022-07-07 NOTE — Patient Outreach (Signed)
  Care Coordination   Follow Up Visit Note   07/07/2022 Name: Keith Jenkins MRN: [id number] DOB: 24-Jan-1946  Keith Jenkins is a 77 y.o. year old male who sees Keith Jenkins, Keith Mule, NP for primary care. I  collaborated with community agencies on the patients behalf     Goals Addressed             This [place] Coordination Interventions: Unsuccessful outbound call placed to [place] to place patient on the wait list for meals on wheels Secure email sent to Horton Chin, Nutrition Director at [email or web address] referring patient to the program Obtain $50 gift card from [facility] which will be mailed to the patients home address Voice message left for [facility] with [facility] program requesting a return call to discuss the status of patients previous referral for home modifications        SDOH assessments and interventions completed:  No     Care Coordination Interventions:  Yes, provided   Follow up plan:  SW will continue to follow    Encounter Outcome:  Pt. Visit Completed   Donnamae Jude, CDP Social Worker, Certified Dementia Practitioner [facility] Care Management  Care Coordination [phone]

## 2022-07-09 ENCOUNTER — Other Ambulatory Visit: Payer: Self-pay

## 2022-07-09 ENCOUNTER — Ambulatory Visit: Payer: Self-pay

## 2022-07-09 DIAGNOSIS — I110 Hypertensive heart disease with heart failure: Secondary | ICD-10-CM | POA: Diagnosis not present

## 2022-07-09 DIAGNOSIS — I5040 Unspecified combined systolic (congestive) and diastolic (congestive) heart failure: Secondary | ICD-10-CM

## 2022-07-09 MED ORDER — CYANOCOBALAMIN 1000 MCG/ML IJ SOLN: 1000.0000 ug | INTRAMUSCULAR | 3 refills | Status: DC

## 2022-07-09 NOTE — Patient Outreach (Signed)
  Care Coordination   Follow Up Visit Note   07/09/2022 Name: Keith Jenkins MRN: [id number] DOB: 03-11-1946  Keith Jenkins is a 77 y.o. year old male who sees Keith Jenkins, Keith Mule, NP (Inactive) for primary care. I spoke with  Keith Jenkins by phone today.  What matters to Keith patients health and wellness today?  Assistance with meals    Goals Addressed             This Visit's Progress    Care Coordination Activities   On track    Care Coordination Interventions: Informed patient to expect a $50 gift card via mail to assist with food costs Determined Keith patient has been in contact with Keith Jenkins on Wheels and was advised he will begin receiving one hot meal per day on Monday 2/5. Patient is unsure if his spouse will be approved for a caregiver meal Collaboration with [facility] Director Keith Jenkins who indicates she is checking on caregiver meal eligibility and will reach back out to Keith patient once confirmed Discussed SW has been unable to get in touch with [facility] regarding Keith status of previous home modification referral. New referral submitted via Keith Jenkins to assist with home repair needs including installation of wheel chair ramp, bathroom plumbing repair, and changing out tub to walk in shower Placed a referral to Keith Jenkins via Keith Jenkins as well to assist with above mentioned home modification needs Assessed for financial hardship needs - reviewed patient and his spouse have difficulty covering gas and utility costs Advised SW plans to submit a request to determine if there are any financial grant programs which may assist with utility costs to alleviate financial burdens patient is facing Education provided to Keith patient on Keith [employer] health financial assistance program - patient spouse reports she has attempted to call for financial assistance and has left messages but has yet to receive a return call Secure email sent to  Keith Jenkins, Nutrition Director at [email or web address] referring patient to Keith program Obtain $50 gift card from Keith Jenkins which will be mailed to Keith patients home address Voice message left for [address] with Keith Jenkins program requesting a return call to discuss Keith status of patients previous referral for home modifications        Keith Jenkins assessments and interventions completed:  No     Care Coordination Interventions:  Yes, provided   Follow up plan:  SW will continue to follow    Encounter Outcome:  Pt. Visit Completed   Keith Jenkins, Keith Jenkins, CDP Social Worker, Certified Dementia Practitioner Keith [facility]  Care Coordination [phone]

## 2022-07-09 NOTE — Patient Instructions (Signed)
Visit Information  Thank you for taking time to visit with me today. Please don't hesitate to contact me if I can be of assistance to you.   Following are the goals we discussed today:   Goals Addressed             This Visit's Progress    Care Coordination Activities   On track    Care Coordination Interventions: Informed patient to expect a $50 gift card via mail to assist with food costs Determined the patient has been in contact with [facility] on Wheels and was advised he will begin receiving one hot meal per day on Monday 2/5. Patient is unsure if his spouse will be approved for a caregiver meal Collaboration with [facility] Director Kari Baars who indicates she is checking on caregiver meal eligibility and will reach back out to the patient once confirmed Discussed SW has been unable to get in touch with [facility] regarding the status of previous home modification referral. New referral submitted via [email or web address] to assist with home repair needs including installation of wheel chair ramp, bathroom plumbing repair, and changing out tub to walk in shower Placed a referral to [employer] via [place] as well to assist with above mentioned home modification needs Assessed for financial hardship needs - reviewed patient and his spouse have difficulty covering gas and utility costs Advised SW plans to submit a request to determine if there are any financial grant programs which may assist with utility costs to alleviate financial burdens patient is facing Education provided to the patient on the [employer] health financial assistance program - patient spouse reports she has attempted to call for financial assistance and has left messages but has yet to receive a return call Secure email sent to Horton Chin, Nutrition Director at [email or web address] referring patient to the program Obtain $50 gift card from [place] which will be  mailed to the patients home address Voice message left for Donnamae Jude with [facility] program requesting a return call to discuss the status of patients previous referral for home modifications        If you are experiencing a Mental Health or [place] or need someone to talk to, please call 911  Patient verbalizes understanding of instructions and care plan provided today and agrees to view in [place]. Active MyChart status and patient understanding of how to access instructions and care plan via MyChart confirmed with patient.     [name], BSW, CDP Social Worker, Certified Dementia Practitioner [place] [facility]  Care Coordination [phone]

## 2022-07-10 ENCOUNTER — Ambulatory Visit (INDEPENDENT_AMBULATORY_CARE_PROVIDER_SITE_OTHER): Payer: Medicare HMO

## 2022-07-10 DIAGNOSIS — N1832 Chronic kidney disease, stage 3b: Secondary | ICD-10-CM | POA: Diagnosis not present

## 2022-07-10 DIAGNOSIS — I350 Nonrheumatic aortic (valve) stenosis: Secondary | ICD-10-CM | POA: Diagnosis not present

## 2022-07-10 DIAGNOSIS — E785 Hyperlipidemia, unspecified: Secondary | ICD-10-CM | POA: Diagnosis not present

## 2022-07-10 DIAGNOSIS — Z87891 Personal history of nicotine dependence: Secondary | ICD-10-CM | POA: Diagnosis not present

## 2022-07-10 DIAGNOSIS — Z5181 Encounter for therapeutic drug level monitoring: Secondary | ICD-10-CM | POA: Diagnosis not present

## 2022-07-10 DIAGNOSIS — Z951 Presence of aortocoronary bypass graft: Secondary | ICD-10-CM | POA: Diagnosis not present

## 2022-07-10 DIAGNOSIS — I2510 Atherosclerotic heart disease of native coronary artery without angina pectoris: Secondary | ICD-10-CM | POA: Diagnosis not present

## 2022-07-10 DIAGNOSIS — K222 Esophageal obstruction: Secondary | ICD-10-CM | POA: Diagnosis not present

## 2022-07-10 DIAGNOSIS — R1319 Other dysphagia: Secondary | ICD-10-CM | POA: Diagnosis not present

## 2022-07-10 DIAGNOSIS — K219 Gastro-esophageal reflux disease without esophagitis: Secondary | ICD-10-CM | POA: Diagnosis not present

## 2022-07-10 DIAGNOSIS — Z7901 Long term (current) use of anticoagulants: Secondary | ICD-10-CM | POA: Diagnosis not present

## 2022-07-10 DIAGNOSIS — I480 Paroxysmal atrial fibrillation: Secondary | ICD-10-CM | POA: Diagnosis not present

## 2022-07-10 DIAGNOSIS — I252 Old myocardial infarction: Secondary | ICD-10-CM | POA: Diagnosis not present

## 2022-07-10 DIAGNOSIS — M1990 Unspecified osteoarthritis, unspecified site: Secondary | ICD-10-CM | POA: Diagnosis not present

## 2022-07-10 DIAGNOSIS — J449 Chronic obstructive pulmonary disease, unspecified: Secondary | ICD-10-CM | POA: Diagnosis not present

## 2022-07-10 DIAGNOSIS — Z954 Presence of other heart-valve replacement: Secondary | ICD-10-CM | POA: Diagnosis not present

## 2022-07-10 DIAGNOSIS — Z7982 Long term (current) use of aspirin: Secondary | ICD-10-CM | POA: Diagnosis not present

## 2022-07-10 DIAGNOSIS — H409 Unspecified glaucoma: Secondary | ICD-10-CM | POA: Diagnosis not present

## 2022-07-10 DIAGNOSIS — Z9181 History of falling: Secondary | ICD-10-CM | POA: Diagnosis not present

## 2022-07-10 DIAGNOSIS — D509 Iron deficiency anemia, unspecified: Secondary | ICD-10-CM | POA: Diagnosis not present

## 2022-07-10 DIAGNOSIS — I5023 Acute on chronic systolic (congestive) heart failure: Secondary | ICD-10-CM | POA: Diagnosis not present

## 2022-07-10 LAB — POCT INR: INR: 2.1 (ref 2.0–3.0)

## 2022-07-10 NOTE — Patient Instructions (Signed)
Description   Spoke with Colletta Maryland, [facility] and pt to instructed for pt to tke 3.5 tablets today and then continue taking 3 tablets daily.  Recheck INR in 1 week.  [facility] [phone] Fax Clearance form to [phone]

## 2022-07-16 ENCOUNTER — Ambulatory Visit: Payer: Self-pay

## 2022-07-16 NOTE — Patient Instructions (Signed)
Visit Information  Thank you for taking time to visit with me today. Please don't hesitate to contact me if I can be of assistance to you.   Following are the goals we discussed today:   Goals Addressed             This Visit's Progress    Care Coordination Activities       Care Coordination Interventions: Confirmed receipt of $50 gift card to be used on groceries Discussed both patient and spouse have been approved to receive Meals on Wheels; meals began this week with patient to receive a hot meal for lunch Monday - Friday Reviewed [id number] to note patients referral to [place] for home repair has been accepted  Advised patient has been approved for assistance through the [employer] for utility assistance - SW obtained account numbers and will move forward with assisting with gas and Engineer, structural with Designer, jewellery regarding next steps for payment of utility costs; SW to continue to follow        If you are experiencing a Mental Health or [place] or need someone to talk to, please call 911  Patient verbalizes understanding of instructions and care plan provided today and agrees to view in [place]. Active MyChart status and patient understanding of how to access instructions and care plan via MyChart confirmed with patient.     [name], BSW, CDP Social Worker, Certified Dementia Practitioner [place] [facility]  Care Coordination [phone]

## 2022-07-16 NOTE — Patient Outreach (Signed)
  Care Coordination   Follow Up Visit Note   07/16/2022 Name: Keith Jenkins MRN: [id number] DOB: 02-16-1946  Keith Jenkins is a 77 y.o. year old male who sees Heaton, Thornell Mule, NP (Inactive) for primary care. I spoke with  Keith Jenkins by phone today.  What matters to the patients health and wellness today?  Assistance with financial strain    Goals Addressed             This Visit's Progress    Care Coordination Activities       Care Coordination Interventions: Confirmed receipt of $50 gift card to be used on groceries Discussed both patient and spouse have been approved to receive Meals on Wheels; meals began this week with patient to receive a hot meal for lunch Monday - Friday Reviewed [id number] to note patients referral to [facility] for home repair has been accepted  Advised patient has been approved for assistance through the [employer] for utility assistance - SW obtained account numbers and will move forward with assisting with gas and Engineer, structural with Designer, jewellery regarding next steps for payment of utility costs; SW to continue to follow        SDOH assessments and interventions completed:  No     Care Coordination Interventions:  Yes, provided   Interventions Today    Flowsheet Row Most Recent Value  Chronic Disease Discussed/Reviewed   Chronic disease discussed/reviewed during today's visit Congestive Heart Failure (CHF), Chronic Kidney Disease/End Stage Renal Disease (ESRD)  General Interventions   General Interventions Discussed/Reviewed General Interventions Reviewed, Community Resources  Nutrition Interventions   Nutrition Discussed/Reviewed Nutrition Reviewed  [Now receiving meals on wheels]        Follow up plan:  SW will continue to follow.    Encounter Outcome:  Pt. Visit Completed   [name], BSW, CDP Social Worker, Certified Dementia Practitioner [place] [facility]  Care  Coordination [phone]

## 2022-07-17 ENCOUNTER — Ambulatory Visit (INDEPENDENT_AMBULATORY_CARE_PROVIDER_SITE_OTHER): Payer: Medicare HMO

## 2022-07-17 DIAGNOSIS — J449 Chronic obstructive pulmonary disease, unspecified: Secondary | ICD-10-CM | POA: Diagnosis not present

## 2022-07-17 DIAGNOSIS — Z951 Presence of aortocoronary bypass graft: Secondary | ICD-10-CM | POA: Diagnosis not present

## 2022-07-17 DIAGNOSIS — I350 Nonrheumatic aortic (valve) stenosis: Secondary | ICD-10-CM | POA: Diagnosis not present

## 2022-07-17 DIAGNOSIS — I480 Paroxysmal atrial fibrillation: Secondary | ICD-10-CM | POA: Diagnosis not present

## 2022-07-17 DIAGNOSIS — D509 Iron deficiency anemia, unspecified: Secondary | ICD-10-CM | POA: Diagnosis not present

## 2022-07-17 DIAGNOSIS — Z5181 Encounter for therapeutic drug level monitoring: Secondary | ICD-10-CM | POA: Diagnosis not present

## 2022-07-17 DIAGNOSIS — Z7982 Long term (current) use of aspirin: Secondary | ICD-10-CM | POA: Diagnosis not present

## 2022-07-17 DIAGNOSIS — I5023 Acute on chronic systolic (congestive) heart failure: Secondary | ICD-10-CM | POA: Diagnosis not present

## 2022-07-17 DIAGNOSIS — Z87891 Personal history of nicotine dependence: Secondary | ICD-10-CM | POA: Diagnosis not present

## 2022-07-17 DIAGNOSIS — H409 Unspecified glaucoma: Secondary | ICD-10-CM | POA: Diagnosis not present

## 2022-07-17 DIAGNOSIS — Z7901 Long term (current) use of anticoagulants: Secondary | ICD-10-CM | POA: Diagnosis not present

## 2022-07-17 DIAGNOSIS — N1832 Chronic kidney disease, stage 3b: Secondary | ICD-10-CM | POA: Diagnosis not present

## 2022-07-17 DIAGNOSIS — K222 Esophageal obstruction: Secondary | ICD-10-CM | POA: Diagnosis not present

## 2022-07-17 DIAGNOSIS — Z9181 History of falling: Secondary | ICD-10-CM | POA: Diagnosis not present

## 2022-07-17 DIAGNOSIS — I252 Old myocardial infarction: Secondary | ICD-10-CM | POA: Diagnosis not present

## 2022-07-17 DIAGNOSIS — I2510 Atherosclerotic heart disease of native coronary artery without angina pectoris: Secondary | ICD-10-CM | POA: Diagnosis not present

## 2022-07-17 DIAGNOSIS — M1990 Unspecified osteoarthritis, unspecified site: Secondary | ICD-10-CM | POA: Diagnosis not present

## 2022-07-17 DIAGNOSIS — E785 Hyperlipidemia, unspecified: Secondary | ICD-10-CM | POA: Diagnosis not present

## 2022-07-17 DIAGNOSIS — K219 Gastro-esophageal reflux disease without esophagitis: Secondary | ICD-10-CM | POA: Diagnosis not present

## 2022-07-17 DIAGNOSIS — Z954 Presence of other heart-valve replacement: Secondary | ICD-10-CM | POA: Diagnosis not present

## 2022-07-17 DIAGNOSIS — R1319 Other dysphagia: Secondary | ICD-10-CM | POA: Diagnosis not present

## 2022-07-17 LAB — POCT INR: INR: 2.5 (ref 2.0–3.0)

## 2022-07-17 NOTE — Patient Instructions (Signed)
Description   Spoke with Colletta Maryland, [facility] and pt. Instructed for pt to continue taking 3 tablets daily.  Recheck INR in 2 weeks.  Coumadin Clinic [phone] Fax Clearance form to [phone]

## 2022-07-18 ENCOUNTER — Ambulatory Visit: Payer: Self-pay

## 2022-07-18 NOTE — Patient Outreach (Signed)
  Care Coordination   Consult  Visit Note   07/18/2022 Name: Keith Jenkins MRN: [id number] DOB: Mar 29, 1946  Keith Jenkins is a 77 y.o. year old male who sees Heaton, Thornell Mule, NP (Inactive) for primary care. I  consulted with leadership regarding next steps for funds assistance.     Goals Addressed             This Visit's Progress    Care Coordination Activities       Care Coordination Interventions: Consult with leadership who requests W9 for both [employer] and [employer] in order to move forward with funds request Attempted to contact both companies to request a W9        SDOH assessments and interventions completed:  No     Care Coordination Interventions:  Yes, provided   Interventions Today    Flowsheet Row Most Recent Value  Chronic Disease Discussed/Reviewed   Chronic disease discussed/reviewed during today's visit Congestive Heart Failure (CHF), Chronic Kidney Disease/End Stage Renal Disease (ESRD)  General Interventions   General Interventions Discussed/Reviewed [employer] Assistance Consult]        Follow up plan:  SW will continue to follow.    Encounter Outcome:  Pt. Visit Completed   [name], BSW, CDP Social Worker, Certified Dementia Practitioner [place] [facility]  Care Coordination [phone]

## 2022-07-21 ENCOUNTER — Encounter (HOSPITAL_COMMUNITY): Payer: Self-pay | Admitting: Internal Medicine

## 2022-07-21 ENCOUNTER — Ambulatory Visit (HOSPITAL_COMMUNITY)
Admission: RE | Admit: 2022-07-21 | Discharge: 2022-07-21 | Disposition: A | Discharge status: Home / Self Care | Payer: Medicare HMO | Source: Ambulatory Visit | Attending: Internal Medicine | Admitting: Internal Medicine

## 2022-07-21 VITALS — BP 112/70 | HR 65 | Wt 187.4 lb

## 2022-07-21 DIAGNOSIS — Z953 Presence of xenogenic heart valve: Secondary | ICD-10-CM | POA: Diagnosis not present

## 2022-07-21 DIAGNOSIS — Z79899 Other long term (current) drug therapy: Secondary | ICD-10-CM | POA: Insufficient documentation

## 2022-07-21 DIAGNOSIS — Z7984 Long term (current) use of oral hypoglycemic drugs: Secondary | ICD-10-CM | POA: Insufficient documentation

## 2022-07-21 DIAGNOSIS — I480 Paroxysmal atrial fibrillation: Secondary | ICD-10-CM | POA: Diagnosis not present

## 2022-07-21 DIAGNOSIS — J449 Chronic obstructive pulmonary disease, unspecified: Secondary | ICD-10-CM | POA: Insufficient documentation

## 2022-07-21 DIAGNOSIS — I110 Hypertensive heart disease with heart failure: Secondary | ICD-10-CM | POA: Insufficient documentation

## 2022-07-21 DIAGNOSIS — I252 Old myocardial infarction: Secondary | ICD-10-CM | POA: Insufficient documentation

## 2022-07-21 DIAGNOSIS — Z951 Presence of aortocoronary bypass graft: Secondary | ICD-10-CM | POA: Diagnosis not present

## 2022-07-21 DIAGNOSIS — Z7901 Long term (current) use of anticoagulants: Secondary | ICD-10-CM | POA: Insufficient documentation

## 2022-07-21 DIAGNOSIS — Z952 Presence of prosthetic heart valve: Secondary | ICD-10-CM

## 2022-07-21 DIAGNOSIS — K219 Gastro-esophageal reflux disease without esophagitis: Secondary | ICD-10-CM | POA: Diagnosis not present

## 2022-07-21 DIAGNOSIS — I255 Ischemic cardiomyopathy: Secondary | ICD-10-CM | POA: Insufficient documentation

## 2022-07-21 DIAGNOSIS — I5022 Chronic systolic (congestive) heart failure: Secondary | ICD-10-CM | POA: Diagnosis not present

## 2022-07-21 DIAGNOSIS — E785 Hyperlipidemia, unspecified: Secondary | ICD-10-CM | POA: Diagnosis not present

## 2022-07-21 DIAGNOSIS — I080 Rheumatic disorders of both mitral and aortic valves: Secondary | ICD-10-CM | POA: Insufficient documentation

## 2022-07-21 DIAGNOSIS — I2510 Atherosclerotic heart disease of native coronary artery without angina pectoris: Secondary | ICD-10-CM | POA: Diagnosis not present

## 2022-07-21 LAB — T4, FREE: Free T4: 1.03 ng/dL (ref 0.61–1.12)

## 2022-07-21 LAB — COMPREHENSIVE METABOLIC PANEL
ALT: 36 U/L (ref 0–44)
AST: 51 U/L — ABNORMAL HIGH (ref 15–41)
Albumin: 3.2 g/dL — ABNORMAL LOW (ref 3.5–5.0)
Alkaline Phosphatase: 115 U/L (ref 38–126)
Anion gap: 13 (ref 5–15)
BUN: 43 mg/dL — ABNORMAL HIGH (ref 8–23)
CO2: 21 mmol/L — ABNORMAL LOW (ref 22–32)
Calcium: 8.6 mg/dL — ABNORMAL LOW (ref 8.9–10.3)
Chloride: 97 mmol/L — ABNORMAL LOW (ref 98–111)
Creatinine, Ser: 1.98 mg/dL — ABNORMAL HIGH (ref 0.61–1.24)
GFR, Estimated: 35 mL/min — ABNORMAL LOW (ref >60)
Glucose, Bld: 112 mg/dL — ABNORMAL HIGH (ref 70–99)
Potassium: 4.1 mmol/L (ref 3.5–5.1)
Sodium: 131 mmol/L — ABNORMAL LOW (ref 135–145)
Total Bilirubin: 1.1 mg/dL (ref 0.3–1.2)
Total Protein: 7.5 g/dL (ref 6.5–8.1)

## 2022-07-21 LAB — CBC
HCT: 38.9 % — ABNORMAL LOW (ref 39.0–52.0)
Hemoglobin: 13.1 g/dL (ref 13.0–17.0)
MCH: 27.9 pg (ref 26.0–34.0)
MCHC: 33.7 g/dL (ref 30.0–36.0)
MCV: 82.8 fL (ref 80.0–100.0)
Platelets: UNDETERMINED 10*3/uL (ref 150–400)
RBC: 4.7 MIL/uL (ref 4.22–5.81)
RDW: 18.9 % — ABNORMAL HIGH (ref 11.5–15.5)
WBC: 5 10*3/uL (ref 4.0–10.5)
nRBC: 0 % (ref 0.0–0.2)

## 2022-07-21 LAB — TSH: TSH: 1.66 u[IU]/mL (ref 0.350–4.500)

## 2022-07-21 LAB — BRAIN NATRIURETIC PEPTIDE: B Natriuretic Peptide: 1703.8 pg/mL — ABNORMAL HIGH (ref 0.0–100.0)

## 2022-07-21 MED ORDER — TORSEMIDE 20 MG PO TABS: 80.0000 mg | ORAL_TABLET | Freq: Every day | ORAL | 3 refills | Status: DC

## 2022-07-21 NOTE — Progress Notes (Signed)
ADVANCED HF CLINIC NOTE   Primary Care: Rip Harbour, NP (Inactive) HF Cardiologist: Dr. Haroldine Laws   HPI: Keith Jenkins is a 77 y.o. male with history of COPD, prediabetes, GERD/dysphagia, HLD, and new diagnosis of CAD and systolic heart failure.  Admitted 4/23 with late presenting inferior STEMI. LHC showed total occlusion mRCA and 90% mLAD. RCA managed medically d/t completed infarct.  Plan to optimize medical therapy and arrange LAD intervention at later time. Echo showed EF 20-25%, RWMA, RV moderately reduced, moderate to severe ischemic MR, aortic root 4.3 cm, dilated IVC with estimated RAP 8 mmHg. AHF consulted and GDMT titrated, but limited due to soft blood pressure. CVTS consulted for CABG/MVR, instead of PCI to LAD, due to moderate to severe ischemic MR.    Admitted 5/23 with a/c CHF Echo EF 30-35%, RV moderately reduced, moderate pericardial effusion with no tamponade. Started on milrinone. Developed AFL and amiodarone gtt started. Underwent CABG x 2 (LIMA-LAD, SVG-RAMUS INT) +MVR, MAZE, LAA Clip. Hospitalization c/b distal esophageal stricture seen on barium swallow. GI consulted and plan for outpatient EGD/dilation.  Seen in ED 02/20/22 for fall after becoming light-headed. CT head and neck negative for acute abnormality.  Echo 03/12/22 EF 20-25%, severe LV dysfunction with global HK, + large Left pleural effusion, RV moderately down, moderate aortic stenosis, suspect component of low gradient AS, dimensionless index = 0.31. Reviewed by Dr. Haroldine Laws. Volume overloaded and NYHA IV at follow up 03/12/22.. Direct admit from clinic for IV diuresis and VAD work up. Diuresed with IV lasix. Underwent RHC showing elevated filling pressures with moderately decreased cardiac output, Fick CO/CI 5.7/2.7 Thermo CO/CI 4.7/2.2. CVTS and Dr. Haroldine Laws reviewed case and felt patient not a good candidate for VAD. Patient also declined VAD. Underwent bilateral thoracentesis before discharge.  Discharged home, weight 172 lbs.  He presents back today for f/u with his daughter and wife. Says he is doing pretty good. About once a week has trouble sleeping and has orthopnea and takes an extra torsemide. No CP.  No lightheadedness or low BP.  Getting on TM most days for 10-15 mins.     Cardiac Studies  - RHC (10/23): RA = 8 RV = 45/10 PA = 48/16 (28) PCW = 22 (v = 30)  Fick cardiac output/index = 5.7/2.7 Thermo CO/CI = 4.7/2.2 PVR = 0.8 WU Ao sat = 94% PA sat = 54%, 57% PAPI = 4.0   - Echo (10/23): EF 20-25%, severe LV dysfunction with global HK, + large Left pleural effusion, RV moderately down, moderate aortic stenosis, suspect component of low gradient AS, dimensionless index = 0.31  - Echo (5/23): EF 25%, RV moderately reduced, moderate to severe MR  - Echo (4/23): EF 20-25%, RWMA, RV moderately reduced, mioderate to severe ischemic MR, aortic root 4.3 cm, dilated IVC with estimated RAP 8 mmHg  - LHC (5/23): 100% m RCA and 90% m LAD. RCA managed medically d/t completed infarct.   ROS: All systems reviewed and negative except as per HPI.   Past Medical History:  Diagnosis Date   Aortic atherosclerosis (HCC)    Arthritis    CHF (congestive heart failure) (HCC)    Esophageal stricture    Glaucoma    MI (myocardial infarction) (HCC)    Osteoarthritis    Current Outpatient Medications  Medication Sig Dispense Refill   albuterol (PROVENTIL) (2.5 MG/3ML) 0.083% nebulizer solution Inhale 3 mLs into the lungs every 6 (six) hours as needed for wheezing or shortness of breath. [place]  mL 12   albuterol (VENTOLIN HFA) 108 (90 Base) MCG/ACT inhaler Inhale 2 puffs into the lungs every 6 (six) hours as needed for wheezing or shortness of breath. 8 g 0   amiodarone (PACERONE) 200 MG tablet Take 0.5 tablets (100 mg total) by mouth daily. 45 tablet 1   aspirin EC 81 MG tablet Take 1 tablet (81 mg total) by mouth daily. Swallow whole. 90 tablet 1   atorvastatin (LIPITOR) 80 MG tablet  Take 1 tablet (80 mg total) by mouth daily. 30 tablet 3   cyanocobalamin (VITAMIN B12) 1000 MCG/ML injection Inject 1 mL (1,000 mcg total) into the muscle every 30 (thirty) days. 1 mL 3   dapagliflozin propanediol (FARXIGA) 10 MG TABS tablet Take 1 tablet (10 mg total) by mouth daily. 90 tablet 1   digoxin (LANOXIN) 0.125 MG tablet Take 0.0625 mg by mouth daily.     escitalopram (LEXAPRO) 10 MG tablet Take 1 tablet (10 mg total) by mouth daily. 90 tablet 1   esomeprazole (NEXIUM) 40 MG capsule Take 40 mg by mouth daily.     ezetimibe (ZETIA) 10 MG tablet Take 0.5 tablets (5 mg total) by mouth daily. 90 tablet 1   Fluticasone-Umeclidin-Vilant (TRELEGY ELLIPTA) 100-62.5-25 MCG/ACT AEPB Inhale 1 puff into the lungs daily. 1 each 11   gabapentin (NEURONTIN) 600 MG tablet Take 1 tablet (600 mg total) by mouth at bedtime. 90 tablet 1   Iron, Ferrous Sulfate, 325 (65 Fe) MG TABS Take 325 mg by mouth daily. 30 tablet 1   latanoprost (XALATAN) 0.005 % ophthalmic solution Place 1 drop into both eyes at bedtime.     Melatonin 3 MG CAPS Take 3 mg by mouth at bedtime.     midodrine (PROAMATINE) 10 MG tablet Take 1 tablet (10 mg total) by mouth 3 (three) times daily. 90 tablet 6   nitroGLYCERIN (NITROSTAT) 0.4 MG SL tablet Place 1 tablet (0.4 mg total) under the tongue every 5 (five) minutes as needed for chest pain. 25 tablet 3   OVER THE COUNTER MEDICATION Apply 1 application  topically at bedtime. Medication: Mag Maxx Cream Back and restless leg     potassium chloride SA (KLOR-CON M) 20 MEQ tablet TAKE TWO TABLETS BY MOUTH ONCE DAILY 30 tablet 6   spironolactone (ALDACTONE) 25 MG tablet Take 1 tablet (25 mg total) by mouth daily. 60 tablet 3   torsemide (DEMADEX) 20 MG tablet TAKE THREE TABLETS BY MOUTH ONCE DAILY 60 tablet 3   warfarin (COUMADIN) 1 MG tablet Take 2 tablets to 3 tablets by mouth daily as directed by Coumadin Clinic 250 tablet 0   No current facility-administered medications for this  encounter.   No Known Allergies  Social History   Socioeconomic History   Marital status: Married    Spouse name: Not on file   Number of children: Not on file   Years of education: Not on file   Highest education level: Not on file  Occupational History   Not on file  Tobacco Use   Smoking status: Former    Types: Cigarettes    Quit date: 1970    Years since quitting: 54.1   Smokeless tobacco: Never  Vaping Use   Vaping Use: Never used  Substance and Sexual Activity   Alcohol use: Not Currently   Drug use: Never   Sexual activity: Not Currently    Birth control/protection: None  Other Topics Concern   Not on file  Social History Narrative  Not on file   Social Determinants of Health   Financial Resource Strain: High Risk (07/04/2022)   Overall Financial Resource Strain (CARDIA)    Difficulty of Paying Living Expenses: Very hard  Food Insecurity: Food Insecurity Present (07/04/2022)   Hunger Vital Sign    Worried About Running Out of Food in the Last Year: Often true    Ran Out of Food in the Last Year: Sometimes true  Transportation Needs: Unmet Transportation Needs (06/20/2022)   PRAPARE - Hydrologist (Medical): Yes    Lack of Transportation (Non-Medical): Yes  Physical Activity: Sufficiently Active (04/14/2022)   Exercise Vital Sign    Days of Exercise per Week: 5 days    Minutes of Exercise per Session: 30 min  Stress: No Stress Concern Present (04/14/2022)   [place]    Feeling of Stress : Not at all  Social Connections: Moderately Integrated (04/17/2022)   Social Connection and Isolation Panel [NHANES]    Frequency of Communication with Friends and Family: More than three times a week    Frequency of Social Gatherings with Friends and Family: More than three times a week    Attends Religious Services: More than 4 times per year    Active Member of [employer] or  Organizations: No    Attends Archivist Meetings: Never    Marital Status: Married  Human resources officer Violence: Not At Risk (04/17/2022)   Humiliation, Afraid, Rape, and Kick questionnaire    Fear of Current or Ex-Partner: No    Emotionally Abused: No    Physically Abused: No    Sexually Abused: No   Family History  Problem Relation Age of Onset   Heart attack Mother    Hypertension Mother    Heart attack Father    Hypertension Father    Stomach cancer Sister    Brain cancer Sister    BP 112/70   Pulse 65   Wt 85 kg (187 lb 6.4 oz)   SpO2 95%   BMI 24.06 kg/m   Wt Readings from Last 3 Encounters:  07/21/22 85 kg (187 lb 6.4 oz)  06/19/22 93 kg (205 lb 0.4 oz)  06/11/22 93 kg (205 lb)    PHYSICAL EXAM: General:  Well appearing. No resp difficulty HEENT: normal Neck: supple. JVP 7-8 Carotids 2+ bilat; no bruits. No lymphadenopathy or thryomegaly appreciated. Cor: PMI nondisplaced. Regular rate & rhythm. 2/6 SEM RSBB  Lungs: mild basilar crackles  Abdomen: soft, nontender, +distended. No hepatosplenomegaly. No bruits or masses. Good bowel sounds. Extremities: no cyanosis, clubbing, rash, edema Neuro: alert & orientedx3, cranial nerves grossly intact. moves all 4 extremities w/o difficulty. Affect pleasant   ECG Not performed   ASSESSMENT & PLAN: 1. Chronic systolic CHF /ICM - s/p late presentation inferior STEMI 05/23 - Echo (5/23): EF 25-30%, RV moderately reduced, moderate to severe MR. - S/p CABG + bioprosthetic MVR 11/01/21 - Echo 03/12/22 showed EF 20-25%, severe LV dysfunction with global HK, + large Left pleural effusion, RV moderately down, moderate aortic stenosis, suspect component of low gradient AS. - RHC (10/23): Elevated filling pressures with moderately decreased cardiac output. RA 8, PA 48/16, PCWP 22, CO/CI (Fick) 5.7/2.7. - Felt not to be a suitable VAD candidate (Drs. Haroldine Laws & Bartle discussed). Patient declined VAD as well. - Will focus  on aggressive medical therapy with tight volume control. (Potentially candidate for ADI volume management trial?). - Improved NYHA II-early  III - Volume elevated RedS 48% - Increase torsemide 80 daily. Recheck labs next week.  - Continue digoxin 0.0625 mg daily. Check Digoxin level  - Continue Farxiga 10 mg daily.  - Continue spiro 25 mg daily - Continue midodrine 10 mg tid. - No beta blocker yet, still requiring BP support w/ midodrine.   2. CAD/recent late presentation inferior STEMI - LHC 05/23: 100% m RCA and 90% m LAD.  - RCA managed medically d/t completed infarct.  - Echo (5/23) EF 25%, RV moderately reduced, moderate to severe MR - Echo (10/23): EF 20-25%, RV moderately down. - s/p CABG x 2 (LIMA-LAD, SVG-Ramus INT)  (11/01/21) - Denies CP - Continue ASA + high intensity statin.    3. Mitral regurgitation - Moderate to severe ischemic MR on TEE.  - S/p bioprosthetic MVR 11/02/21. - On warfarin per TCTS (3 months post valve). INR followed by [facility].    4. LFLG Aortic stenosis - Moderate on echo 10/23 - May also have to think about TAVR at some point if he is a candidate. - will repeat echo at next visit   5. H/o Pericardial effusion/Acute Pericarditis  - Moderate in size on past echo, w/o tamponade. - Acute pericarditis/ adhensions noted at time of CABG. - Resolved on echo 10/23    6. PAF - s/p MAZE w/ CABG  - in sinus today - Continue amiodarone 100 mg daily.  - Continue warfarin. - INR followed in [facility]madin Clinic.      Glori Bickers, MD  10:26 AM

## 2022-07-21 NOTE — Patient Instructions (Addendum)
INCREASE Torsemide to 13m (4 Tabs) Daily  Labs done today, your results will be available in MyChart, we will contact you for abnormal readings.  Repeat blood work next Friday   Your physician has requested that you have an echocardiogram. Echocardiography is a painless test that uses sound waves to create images of your heart. It provides your doctor with information about the size and shape of your heart and how well your heart's chambers and valves are working. This procedure takes approximately one hour. There are no restrictions for this procedure. Please do NOT wear cologne, perfume, aftershave, or lotions (deodorant is allowed). Please arrive 15 minutes prior to your appointment time.  Your physician recommends that you schedule a follow-up appointment in: 4 months (June 2024) with an echocardiogram ** please call the office in April to arrange your follow up appointment. **  If you have any questions or concerns before your next appointment please send [place] message through [place] call our office at [phone]    TO LEAVE A MESSAGE FOR THE NURSE SELECT OPTION 2, PLEASE LEAVE A MESSAGE INCLUDING: YOUR NAME DATE OF BIRTH CALL BACK NUMBER REASON FOR CALL**this is important as we prioritize the call backs  YOU WILL RECEIVE A CALL BACK THE SAME DAY AS LONG AS YOU CALL BEFORE 4:00 PM  At the [facility] you and your health needs are our priority. As part of our continuing mission to provide you with exceptional heart care, we have created designated Provider Care Teams. These Care Teams include your primary Cardiologist (physician) and Advanced Practice Providers (APPs- Physician Assistants and Nurse Practitioners) who all work together to provide you with the care you need, when you need it.   You may see any of the following providers on your designated Care Team at your next follow up: Dr DGlori BickersDr DLoralie ChampagneDr. ARoxana Hires  NP BLyda Jester PUtahJSouth [facility] Surgical [facility] LLCLRawlings PUtahAForestine Na NP LAudry Riles PharmD   Please be sure to bring in all your medications bottles to every appointment.    [facility] you for choosing [facility]

## 2022-07-21 NOTE — Progress Notes (Signed)
ReDS Vest / Clip - 07/21/22 1000       ReDS Vest / Clip   Station Marker C    Ruler Value 28    ReDS Value Range High volume overload    ReDS Actual Value 48

## 2022-07-22 DIAGNOSIS — Z87891 Personal history of nicotine dependence: Secondary | ICD-10-CM | POA: Diagnosis not present

## 2022-07-22 DIAGNOSIS — R1319 Other dysphagia: Secondary | ICD-10-CM | POA: Diagnosis not present

## 2022-07-22 DIAGNOSIS — I480 Paroxysmal atrial fibrillation: Secondary | ICD-10-CM | POA: Diagnosis not present

## 2022-07-22 DIAGNOSIS — I350 Nonrheumatic aortic (valve) stenosis: Secondary | ICD-10-CM | POA: Diagnosis not present

## 2022-07-22 DIAGNOSIS — N1832 Chronic kidney disease, stage 3b: Secondary | ICD-10-CM | POA: Diagnosis not present

## 2022-07-22 DIAGNOSIS — H409 Unspecified glaucoma: Secondary | ICD-10-CM | POA: Diagnosis not present

## 2022-07-22 DIAGNOSIS — I2510 Atherosclerotic heart disease of native coronary artery without angina pectoris: Secondary | ICD-10-CM | POA: Diagnosis not present

## 2022-07-22 DIAGNOSIS — M1990 Unspecified osteoarthritis, unspecified site: Secondary | ICD-10-CM | POA: Diagnosis not present

## 2022-07-22 DIAGNOSIS — D509 Iron deficiency anemia, unspecified: Secondary | ICD-10-CM | POA: Diagnosis not present

## 2022-07-22 DIAGNOSIS — Z951 Presence of aortocoronary bypass graft: Secondary | ICD-10-CM | POA: Diagnosis not present

## 2022-07-22 DIAGNOSIS — Z9181 History of falling: Secondary | ICD-10-CM | POA: Diagnosis not present

## 2022-07-22 DIAGNOSIS — Z954 Presence of other heart-valve replacement: Secondary | ICD-10-CM | POA: Diagnosis not present

## 2022-07-22 DIAGNOSIS — I252 Old myocardial infarction: Secondary | ICD-10-CM | POA: Diagnosis not present

## 2022-07-22 DIAGNOSIS — K222 Esophageal obstruction: Secondary | ICD-10-CM | POA: Diagnosis not present

## 2022-07-22 DIAGNOSIS — Z7901 Long term (current) use of anticoagulants: Secondary | ICD-10-CM | POA: Diagnosis not present

## 2022-07-22 DIAGNOSIS — J449 Chronic obstructive pulmonary disease, unspecified: Secondary | ICD-10-CM | POA: Diagnosis not present

## 2022-07-22 DIAGNOSIS — K219 Gastro-esophageal reflux disease without esophagitis: Secondary | ICD-10-CM | POA: Diagnosis not present

## 2022-07-22 DIAGNOSIS — I5023 Acute on chronic systolic (congestive) heart failure: Secondary | ICD-10-CM | POA: Diagnosis not present

## 2022-07-22 DIAGNOSIS — Z7982 Long term (current) use of aspirin: Secondary | ICD-10-CM | POA: Diagnosis not present

## 2022-07-22 DIAGNOSIS — E785 Hyperlipidemia, unspecified: Secondary | ICD-10-CM | POA: Diagnosis not present

## 2022-07-23 ENCOUNTER — Ambulatory Visit: Payer: Self-pay

## 2022-07-23 LAB — T3, FREE: T3, Free: 2.1 pg/mL (ref 2.0–4.4)

## 2022-07-23 NOTE — Patient Outreach (Signed)
  Care Coordination   Follow Up Visit Note   07/23/2022 Name: Claudie Rathbone MRN: [id number] DOB: [birth year]  Zaron Zwiefelhofer is a 77 y.o. year old male who sees Heaton, Thornell Mule, NP (Inactive) for primary care. I spoke with  Mechele Collin by phone today.  What matters to the patients health and wellness today?  Financial strain    Goals Addressed             This Visit's Progress    Care Coordination Activities       Care Coordination Interventions: Obtained and submitted W9 from both [employer] and [employer] Advised patient SW has submitted Icard forms and will update on status of grant as needed Education provided to patient on [employer] benefit including over the counter and extra benefit card Patient has $105 quarterly to spend on over the counter catalog Patient has #180 quarterly to spend on "extra benefits card" which may cover the cost of healthy food, utilities (electric, gas, phone, internet), transportation (including gas or ride share), personal care items, and rent/mortgage Written education mailed to the patients home for patient to review with his spouse and daughter        SDOH assessments and interventions completed:  No     Care Coordination Interventions:  Yes, provided   Interventions Today    Flowsheet Row Most Recent Value  Chronic Disease   Chronic disease during today's visit Congestive Heart Failure (CHF), Chronic Kidney Disease/End Stage Renal Disease (ESRD)  General Interventions   General Interventions Discussed/Reviewed General Interventions Reviewed, Geologist, engineering plan benefits]  Education Interventions   Education Provided Provided Engineer, site, Provided Education  Provided Verbal Education On Insurance Plans       Follow up plan: Follow up call scheduled for 2/28    Encounter Outcome:  Pt. Visit Completed   [name], Arita Miss, CDP Social Worker, Certified Dementia Practitioner [place] [facility]  Care  Coordination [phone]

## 2022-07-23 NOTE — Patient Instructions (Signed)
Visit Information  Thank you for taking time to visit with me today. Please don't hesitate to contact me if I can be of assistance to you.   Following are the goals we discussed today:   Goals Addressed             This Visit's Progress    Care Coordination Activities       Care Coordination Interventions: Obtained and submitted W9 from both [employer] and [employer] Advised patient SW has submitted W9 forms and will update on status of grant as needed Education provided to patient on [employer] benefit including over the counter and extra benefit card Patient has $105 quarterly to spend on over the counter catalog Patient has #180 quarterly to spend on "extra benefits card" which may cover the cost of healthy food, utilities (electric, gas, phone, internet), transportation (including gas or ride share), personal care items, and rent/mortgage Written education mailed to the patients home for patient to review with his spouse and daughter        Our next appointment is by telephone on 2/28 at 1:00 pm  Please call the care guide team at [phone] if you need to cancel or reschedule your appointment.   If you are experiencing a Mental Health or [place] or need someone to talk to, please call [phone]  Patient verbalizes understanding of instructions and care plan provided today and agrees to view in [place]. Active MyChart status and patient understanding of how to access instructions and care plan via MyChart confirmed with patient.     Telephone follow up appointment with care management team member scheduled for:2/28  [name], Arita Miss, CDP Social Worker, Certified Dementia Practitioner [place] [facility]  Care Coordination [phone]

## 2022-07-24 ENCOUNTER — Telehealth: Payer: Self-pay

## 2022-07-24 NOTE — Progress Notes (Signed)
Care Management & Coordination Services Pharmacy Team  Reason for Encounter: Medication coordination and delivery  Contacted patient to discuss medications and coordinate delivery from [facility]. Spoke with family on 07/24/2022  Cycle dispensing form sent to Keith Jenkins for review.   Last adherence delivery date: 07-07-2022  Patient is due for next adherence delivery on: 08-05-2022  This delivery to include: Vials  30 Days (After going over meds wife decided to switch to vial Amiodarone 200 mg half tablet at breakfast Atorvastatin 80 mg at BT Digoxin 0.125 mg half tablet at breakfast Lexapro 10 mg at B Zetia 10 mg half tablet at breakfast Gabapentin 600 mg at BT Potassium chloride 20 meq 2 at B Spironolactone 25 mg at breakfast Warfarin 1 mg 2-3 tabs daily as directed (Vial) Aspirin 81 mg at B  Patient declined the following medications this month: Torsemide- Dose was increased and was sent to [facility]. Patient's wife will request to [facility] for delivery next month when she picks medication up. Midodrine- PAP Proventil- Plenty supply Ventolin- plenty supply Trelegy- PAP in process Farxiga- PAP  No refill request needed.  Confirmed delivery date of 08-05-2022, advised patient that pharmacy will contact them the morning of delivery.-   Any concerns about your medications? No  How often do you forget or accidentally miss a dose? Never  Do you use a pillbox? Yes  Is patient in packaging No  Recent blood pressure readings are as follows: Patient stated a nurse comes in and checks so she doesn't have readings but will start documenting for next month.  Recent blood glucose readings are as follows: Patient stated a nurse comes in and checks so she doesn't have readings but will start documenting for next month.  Chart review: Recent office visits:  07-24-2022 [name] (CCM).  07-18-2022 [name] (CCM).  07-16-2022 [name] (CCM).  07-09-2022 [name] (CCM).  07-07-2022 [name] (CCM).  07-04-2022 [name] (CCM).  07-01-2022 [name], RN (CCM).  Recent consult visits:  07-21-2022 Bensimhon, Shaune Pascal, MD (Cardiology). B Natriuretic Peptide= 1,703.8. HCT= 38.9, RDW= 18.9. Sodium= 131, Chloride= 97. CO2=21, Glucose= 112, BUN= 43, Creatinine= 1.98, Calcium= 8.6, Albumin= 3.2, AST= 51, eGFR= 35  07-17-2022 Leonidas Romberg, RN (Cardiology). Anti coag visit.  07-10-2022 Leonidas Romberg, RN (Cardiology). Anti coag visit.  07-03-2022 Leonidas Romberg, RN (Cardiology). Anti coag visit.  06-26-2022 Leonidas Romberg, RN (Cardiology). Anti coag visit.  Hospital visits:  Medication Reconciliation was completed by comparing discharge summary, patient's EMR and Pharmacy list, and upon discussion with patient.   Admitted to the hospital on 02-20-2022 due to Fall encounter. Discharge date was 02-20-2022. Discharged from [place]?Medications Started at [facility] Discharge:?? None   Medication Changes at Hospital Discharge: None   Medications Discontinued at Hospital Discharge: None   Medications that remain the same after Hospital Discharge:??  -All other medications will remain the same.     Hospital visits:  Medication Reconciliation was completed by comparing discharge summary, patient's EMR and Pharmacy list, and upon discussion with patient.   Admitted to the hospital on 02-12-2022 due to Scalp laceration. Discharge date was 02-12-2022. Discharged from [place]?Medications Started at [facility] Discharge:?? None   Medication Changes at Hospital Discharge: None   Medications Discontinued at Hospital Discharge: None   Medications that remain the same after Hospital Discharge:??  -All other medications will remain the same.  Hospital visits:  Medication Reconciliation was completed by comparing discharge summary,  patient's EMR and Pharmacy list, and upon discussion with patient.   Admitted to the hospital on 01-28-2022 due to Fall encounter. Discharge date was 01-28-2022. Discharged from [place]?Medications Started at [facility] Discharge:?? None   Medication Changes at Hospital Discharge: None   Medications Discontinued at Hospital Discharge: None   Medications that remain the same after Hospital Discharge:??  -All other medications will remain the same.     Medications: Outpatient Encounter Medications as of 07/24/2022  Medication Sig   albuterol (PROVENTIL) (2.5 MG/3ML) 0.083% nebulizer solution Inhale 3 mLs into the lungs every 6 (six) hours as needed for wheezing or shortness of breath.   albuterol (VENTOLIN HFA) 108 (90 Base) MCG/ACT inhaler Inhale 2 puffs into the lungs every 6 (six) hours as needed for wheezing or shortness of breath.   amiodarone (PACERONE) 200 MG tablet Take 0.5 tablets (100 mg total) by mouth daily.   aspirin EC 81 MG tablet Take 1 tablet (81 mg total) by mouth daily. Swallow whole.   atorvastatin (LIPITOR) 80 MG tablet Take 1 tablet (80 mg total) by mouth daily.   cyanocobalamin (VITAMIN B12) 1000 MCG/ML injection Inject 1 mL (1,000 mcg total) into the muscle every 30 (thirty) days.   dapagliflozin propanediol (FARXIGA) 10 MG TABS tablet Take 1 tablet (10 mg total) by mouth daily.   digoxin (LANOXIN) 0.125 MG tablet Take 0.0625 mg by mouth daily.   escitalopram (LEXAPRO) 10 MG tablet Take 1 tablet (10 mg total) by mouth daily.   esomeprazole (NEXIUM) 40 MG capsule Take 40 mg by mouth daily.   ezetimibe (ZETIA) 10 MG tablet Take 0.5 tablets (5 mg total) by mouth daily.   Fluticasone-Umeclidin-Vilant (TRELEGY ELLIPTA) 100-62.5-25 MCG/ACT AEPB Inhale 1 puff into the lungs daily.   gabapentin (NEURONTIN) 600 MG tablet Take 1 tablet (600 mg total) by mouth at bedtime.   Iron, Ferrous Sulfate, 325 (65 Fe) MG TABS Take 325 mg by mouth daily.    latanoprost (XALATAN) 0.005 % ophthalmic solution Place 1 drop into both eyes at bedtime.   Melatonin 3 MG CAPS Take 3 mg by mouth at bedtime.   midodrine (PROAMATINE) 10 MG tablet Take 1 tablet (10 mg total) by mouth 3 (three) times daily.   nitroGLYCERIN (NITROSTAT) 0.4 MG SL tablet Place 1 tablet (0.4 mg total) under the tongue every 5 (five) minutes as needed for chest pain.   OVER THE COUNTER MEDICATION Apply 1 application  topically at bedtime. Medication: Mag Maxx Cream Back and restless leg   potassium chloride SA (KLOR-CON M) 20 MEQ tablet TAKE TWO TABLETS BY MOUTH ONCE DAILY   spironolactone (ALDACTONE) 25 MG tablet Take 1 tablet (25 mg total) by mouth daily.   torsemide (DEMADEX) 20 MG tablet Take 4 tablets (80 mg total) by mouth daily.   warfarin (COUMADIN) 1 MG tablet Take 2 tablets to 3 tablets by mouth daily as directed by [facility]   No facility-administered encounter medications on file as of 07/24/2022.   BP Readings from Last 3 Encounters:  07/21/22 112/70  06/19/22 95/71  06/11/22 (!) 140/90    Pulse Readings from Last 3 Encounters:  07/21/22 65  06/19/22 (!) 58  06/11/22 74    Lab Results  Component Value Date/Time   HGBA1C 6.0 (H) 03/18/2022 05:58 AM   HGBA1C 6.0 (H) 11/01/2021 04:30 AM   Lab Results  Component Value Date  CREATININE 1.98 (H) 07/21/2022   BUN 43 (H) 07/21/2022   GFRNONAA 35 (L) 07/21/2022   GFRAA 65 03/27/2020   NA 131 (L) 07/21/2022   K 4.1 07/21/2022   CALCIUM 8.6 (L) 07/21/2022   CO2 21 (L) 07/21/2022    [place] Pharmacist Assistant [phone]

## 2022-07-30 ENCOUNTER — Telehealth: Payer: Self-pay

## 2022-07-30 NOTE — Progress Notes (Signed)
Care Management & Coordination Services Pharmacy Team  Reason for Encounter: Appointment Reminder  Contacted patient to confirm telephone appointment with Arizona Constable, PharmD on 08/01/22 at 9:00 am.  Spoke with patient on 07/30/2022   Do you have any problems getting your medications? No  What is your top health concern you would like to discuss at your upcoming visit? No top health  concerns   Have you seen any other providers since your last visit with PCP? No   Chart review:  Recent office visits:  None  Recent consult visits:  None  Hospital visits:  None   Star Rating Drugs:  Medication:  Last Fill: Day Supply Atorvastatin   07/07/22-05/21/22 30/40ds Dapagliflozin (PAP) Received in January for [name]: Annual wellness visit in last year? No   Keith Jenkins, [facility] manager  [phone]

## 2022-07-31 ENCOUNTER — Telehealth: Payer: Self-pay

## 2022-07-31 ENCOUNTER — Ambulatory Visit: Payer: Self-pay

## 2022-07-31 NOTE — Patient Instructions (Signed)
Visit Information  Thank you for taking time to visit with me today. Please don't hesitate to contact me if I can be of assistance to you.   Following are the goals we discussed today:   Goals Addressed             This Visit's Progress    Care Coordination Activities       Care Coordination Interventions: Obtained verbal consent and submitted Patient Assistance Acknowledgment Form Determined patient and his spouse have been sick in bed for several days with the stomach bug Encouraged patient to contact his primary care provider as needed Collaboration with RN Care Manager to advise patient is very sick with stomach bug         Our next appointment is by telephone on 2/28 at 3:00 pm  Please call the care guide team at [phone] if you need to cancel or reschedule your appointment.   If you are experiencing a Mental Health or [place] or need someone to talk to, please call [phone]  Patient verbalizes understanding of instructions and care plan provided today and agrees to view in [place]. Active MyChart status and patient understanding of how to access instructions and care plan via MyChart confirmed with patient.     Telephone follow up appointment with care management team member scheduled for:2/28  [name], Arita Miss, CDP Social Worker, Certified Dementia Practitioner [place] [facility]  Care Coordination [phone]

## 2022-07-31 NOTE — Telephone Encounter (Signed)
Called Stephanie with [facility] concerning pt's INR due today. Colletta [place] stated pt has a "stomach bug" and declined home health visit today. [facility] rescheduled home visit to Monday, 08/04/22 and will check INR at that visit.

## 2022-07-31 NOTE — Patient Outreach (Signed)
  Care Coordination   Follow Up Visit Note   07/31/2022 Name: Keith Jenkins MRN: [id number] DOB: 1945-12-22  Keith Jenkins is a 77 y.o. year old male who sees Cox, Kirsten, MD for primary care. I  spoke with patients spouse and caregiver by phone.  What matters to the patients health and wellness today?  Patient is sick in bed with stomach bug    Goals Addressed             This Visit's Progress    Care Coordination Activities       Care Coordination Interventions: Obtained verbal consent and submitted Patient Assistance Acknowledgment Form Determined patient and his spouse have been sick in bed for several days with the stomach bug Encouraged patient to contact his primary care provider as needed Collaboration with RN Care Manager to advise patient is very sick with stomach bug         SDOH assessments and interventions completed:  No     Care Coordination Interventions:  Yes, provided   Interventions Today    Flowsheet Row Most Recent Value  Chronic Disease   Chronic disease during today's visit Congestive Heart Failure (CHF), Chronic Kidney Disease/End Stage Renal Disease (ESRD)  General Interventions   General Interventions Discussed/Reviewed General Interventions Reviewed, Communication with  Communication with RN        Follow up plan:  SW will follow up with patient over the next week    Encounter Outcome:  Pt. Visit Completed   Nadara Eaton, CDP Social Worker, Certified Dementia Practitioner [facility] Care Management  Care Coordination [phone]

## 2022-08-01 ENCOUNTER — Ambulatory Visit: Payer: Medicare HMO

## 2022-08-01 ENCOUNTER — Other Ambulatory Visit (HOSPITAL_COMMUNITY): Payer: Medicare HMO

## 2022-08-01 NOTE — Patient Outreach (Signed)
Care Management & Coordination Services Pharmacy Note  08/01/2022 Name:  Keith Jenkins MRN:  [id number] DOB:  [birth year]  Summary: -Very very very pleasant couple presents for f/u CCM visit. They have been married for 56 years. He worked as a Administrator. [name], his wife, grew up in [place]. Writes poems. Read me one she just finished called "Dust on the Bible"  Recommendations/Changes made from today's visit: -Social Worker Tillie Rung has patient approved for both Meals On Wheels AND a grant for heating/electric!! -Patient states she's getting her PAP's for free   Subjective: Keith Jenkins is an 77 y.o. year old male who is a primary patient of Cox, Kirsten, MD.  The care coordination team was consulted for assistance with disease management and care coordination needs.    Engaged with patient by telephone for follow up visit.  Recent office visits:  04-17-2022 [name], RN (CCM).   04-14-2022 Rip Harbour, NP. START Gabapentin 600 mg at bedtime.    02-19-2022 Rochel Brome, MD. Flu vaccine given.    01-06-2022 Rip Harbour, NP. Hemo= 10.3, Hema= 32.2, MCV= 75, MCH= 24.0, RDW= 17.1. Creatinine= 1.32, eGFR= 57, Chloride= 93. HDL= 36   Recent consult visits:  04-15-2022 Leonidas Romberg, RN (Cardiology). Anti coag visit.   04-08-2022 Jerene Bears, MD. ESOPHAGOGASTRODUODENOSCOPY (EGD) WITH PROPOFOL procedure.    04-01-2022 Leonidas Romberg, RN (Cardiology). Anti coag visit.   03-31-2022 Rafael Bihari, FNP (Vascular). B Natriuretic Peptide= 1,681.5. Sodium= 134, Glucose= 135, BUN= 32, Creatinine= 1.91, Calcium= 8.7, GFR, Estimated= 36. RBC= 4.10, Hemo= 10.2, HCT= 30.7, MCV= 74.9, MCH= 24.9, RDW= 29.9.    03-25-2022 Leonidas Romberg, RN (Cardiology). Anti coag visit.   03-12-2022 Bensimhon, Shaune Pascal, MD  (Cardiology). RIGHT HEART CATH and THORACENTESIS procedures.   03-12-2022 Rafael Bihari, FNP (Vascular). B natriuretic peptide= 1,417.5. RBC= 3.96,  Hemo= 8.5, HCT= 26.9, MCV= 67.9, MCH= 21.5, RDW= 19.2. BUN= 34, Creatinine= 1.96, Albumin= 3.2, Egfr= 35.   03-11-2022 Leonidas Romberg, RN (Cardiology). Anti coag visit.   03-04-2022 Leonidas Romberg, RN (Cardiology). Anti coag visit.   02-25-2022 Leonidas Romberg, RN (Cardiology). Anti coag visit.   02-18-2022 Leonidas Romberg, RN (Cardiology). Anti coag visit.   02-14-2022 Rafael Bihari, FNP. B natriuretic peptide= 1, 321.3. Glucose= 100, Creatinine= 1.59, Gfr= 45.   02-11-2022 Hemphill, Candance B, RN (Cardiology). Anti coag visit.   02-04-2022 Hemphill, Myrtis Hopping, RN (Cardiology). Anti coag visit.   02-04-2022 Pyrtle, Lajuan Lines, MD Gertie Fey). Follow up visit.   01-22-2022 Gaye Pollack, MD (Cardiothoracic Surgery). Follow up.   01-21-2022 Leonidas Romberg, RN (Cardiology). Anti coag visit.   01-14-2022 Leonidas Romberg, RN (Cardiology). Anti coag visit.   01-14-2022 Scarlette Calico, RN (Vascular). DECREASE Midodrine to 5 mg Three times a day and START Zetia 5 mg (1/2 tab) Daily.   01-07-2022 Leonidas Romberg, RN (Cardiology). Anti coag visit.   12-31-2021 Leonidas Romberg, RN (Cardiology). Anti coag visit.   12-24-2021 Leonidas Romberg, RN (Cardiology). Anti coag visit.   12-17-2021 Leonidas Romberg, RN (Cardiology). Anti coag visit.   12-11-2021 Leonidas Romberg, RN (Cardiology). Anti coag visit.   12-05-2021 Gaye Pollack, MD (Cardiothoracic). Follow up visit.   12-04-2021 Rafael Bihari, FNP (Vascular). B Natriuretic Peptide= 1,268.7. Chloride= 96, Creatinine= 1.36, Calcium= 8.5, Egfr= 55.   12-03-2021 Leonidas Romberg, RN (Cardiology). Anti coag visit.   11-26-2021 Leonidas Romberg, RN (Cardiology). Anti coag  visit.   Hospital visits:  Medication Reconciliation was completed by comparing discharge summary, patient's EMR and Pharmacy list, and upon discussion with patient.   Admitted to the hospital on 02-20-2022 due to Fall encounter.  Discharge date was 02-20-2022. Discharged from [place]?Medications Started at [facility] Discharge:?? None   Medication Changes at Hospital Discharge: None   Medications Discontinued at Hospital Discharge: None   Medications that remain the same after Hospital Discharge:??  -All other medications will remain the same.     Hospital visits:  Medication Reconciliation was completed by comparing discharge summary, patient's EMR and Pharmacy list, and upon discussion with patient.   Admitted to the hospital on 02-12-2022 due to Scalp laceration. Discharge date was 02-12-2022. Discharged from [place]?Medications Started at [facility] Discharge:?? None   Medication Changes at Hospital Discharge: None   Medications Discontinued at Hospital Discharge: None   Medications that remain the same after Hospital Discharge:??  -All other medications will remain the same.     Hospital visits:  Medication Reconciliation was completed by comparing discharge summary, patient's EMR and Pharmacy list, and upon discussion with patient.   Admitted to the hospital on 01-28-2022 due to Fall encounter. Discharge date was 01-28-2022. Discharged from [place]?Medications Started at [facility] Discharge:?? None   Medication Changes at Hospital Discharge: None   Medications Discontinued at Hospital Discharge: None   Medications that remain the same after Hospital Discharge:??  -All other medications will remain the same.     Objective:  Lab Results  Component Value Date   CREATININE 1.98 (H) 07/21/2022   BUN 43 (H) 07/21/2022   EGFR 36 (L) 06/11/2022   GFRNONAA 35 (L) 07/21/2022   GFRAA 65 03/27/2020   NA 131 (L) 07/21/2022   K 4.1 07/21/2022   CALCIUM 8.6 (L) 07/21/2022   CO2 21 (L) 07/21/2022   GLUCOSE 112 (H) 07/21/2022    Lab Results  Component Value Date/Time   HGBA1C 6.0 (H) 03/18/2022 05:58 AM   HGBA1C 6.0 (H)  11/01/2021 04:30 AM    Last diabetic Eye exam: No results found for: "HMDIABEYEEXA"  Last diabetic Foot exam: No results found for: "HMDIABFOOTEX"   Lab Results  Component Value Date   CHOL 75 03/18/2022   HDL 33 (L) 03/18/2022   LDLCALC 29 03/18/2022   TRIG 63 03/18/2022   CHOLHDL 2.3 03/18/2022       Latest Ref Rng & Units 07/21/2022   10:37 AM 06/11/2022   11:07 AM 03/18/2022    5:56 AM  Hepatic Function  Total Protein 6.5 - 8.1 g/dL 7.5  7.0  6.9   Albumin 3.5 - 5.0 g/dL 3.2  3.9  3.3   AST 15 - 41 U/L 51  61  33   ALT 0 - 44 U/L 36  40  27   Alk Phosphatase 38 - 126 U/L 115  181  89   Total Bilirubin 0.3 - 1.2 mg/dL 1.1  1.2  1.2   Bilirubin, Direct 0.0 - 0.2 mg/dL   0.2     Lab Results  Component Value Date/Time   TSH 1.660 07/21/2022 10:37 AM   TSH 1.049 03/18/2022 05:58 AM   TSH 1.100 03/27/2020 11:16 AM   FREET4 1.03 07/21/2022 10:37 AM   FREET4 1.24 (H) 03/18/2022 05:58 AM       Latest Ref Rng & Units 07/21/2022   10:37 AM 06/11/2022  11:07 AM 05/15/2022    2:03 PM  CBC  WBC 4.0 - 10.5 K/uL 5.0  5.9  5.2   Hemoglobin 13.0 - 17.0 g/dL 13.1  11.5  10.5   Hematocrit 39.0 - 52.0 % 38.9  36.0  33.0   Platelets 150 - 400 K/uL PLATELET CLUMPS NOTED ON SMEAR, UNABLE TO ESTIMATE  111  148     Lab Results  Component Value Date/Time   VITAMINB12 812 05/14/2022 09:18 AM   VITAMINB12 606 03/13/2022 07:56 AM    Clinical ASCVD: Yes  The ASCVD Risk score (Arnett DK, et al., 2019) failed to calculate for the following reasons:   The patient has a prior MI or stroke diagnosis    Other: (CHADS2VASc if Afib, MMRC or CAT for COPD, ACT, DEXA)     04/14/2022   10:06 AM 10/23/2021   11:56 AM 07/09/2021   10:05 AM  Depression screen PHQ 2/9  Decreased Interest 0 0 0  Down, Depressed, Hopeless 0 1 0  PHQ - 2 Score 0 1 0  Altered sleeping 0 1   Tired, decreased energy 0 1   Change in appetite 0 1   Feeling bad or failure about yourself  0 1   Trouble concentrating 0 0    Moving slowly or fidgety/restless 0 0   Suicidal thoughts 0 0   PHQ-9 Score 0 5   Difficult doing work/chores Not difficult at all Not difficult at all      Social History   Tobacco Use  Smoking Status Former   Types: Cigarettes   Quit date: 1970   Years since quitting: 54.1  Smokeless Tobacco Never   BP Readings from Last 3 Encounters:  07/21/22 112/70  06/19/22 95/71  06/11/22 (!) 140/90   Pulse Readings from Last 3 Encounters:  07/21/22 65  06/19/22 (!) 58  06/11/22 74   Wt Readings from Last 3 Encounters:  07/21/22 187 lb 6.4 oz (85 kg)  06/19/22 205 lb 0.4 oz (93 kg)  06/11/22 205 lb (93 kg)   BMI Readings from Last 3 Encounters:  07/21/22 24.06 kg/m  06/19/22 26.32 kg/m  06/11/22 26.32 kg/m    No Known Allergies  Medications Reviewed Today     Reviewed by Stanford Scotland, RN (Registered Nurse) on 07/21/22 at [phone]  Med List Status: <None>   Medication Order Taking? Sig Documenting Provider Last Dose Status Informant  albuterol (PROVENTIL) (2.5 MG/3ML) 0.083% nebulizer solution [id number] Yes Inhale 3 mLs into the lungs every 6 (six) hours as needed for wheezing or shortness of breath. Rip Harbour, NP Taking Active Family Member  albuterol (VENTOLIN HFA) 108 (90 Base) MCG/ACT inhaler [id number] Yes Inhale 2 puffs into the lungs every 6 (six) hours as needed for wheezing or shortness of breath. Rip Harbour, NP Taking Active Family Member  amiodarone (PACERONE) 200 MG tablet [id number] Yes Take 0.5 tablets (100 mg total) by mouth daily. Bensimhon, Shaune Pascal, MD Taking Active Family Member  aspirin EC 81 MG tablet [id number] Yes Take 1 tablet (81 mg total) by mouth daily. Swallow whole. Deerwood, Maricela Bo, FNP Taking Active Family Member  atorvastatin (LIPITOR) 80 MG tablet [id number] Yes Take 1 tablet (80 mg total) by mouth daily. Bensimhon, Shaune Pascal, MD Taking Active Family Member  cyanocobalamin (VITAMIN B12) 1000 MCG/ML injection [id number] Yes  Inject 1 mL (1,000 mcg total) into the muscle every 30 (thirty) days. Cox, Kirsten, MD Taking Active   dapagliflozin propanediol (FARXIGA) 10 MG TABS  tablet [id number] Yes Take 1 tablet (10 mg total) by mouth daily. Bensimhon, Shaune Pascal, MD Taking Active Family Member  digoxin (LANOXIN) 0.125 MG tablet [id number] Yes Take 0.0625 mg by mouth daily. [provider] Taking Active   escitalopram (LEXAPRO) 10 MG tablet [id number] Yes Take 1 tablet (10 mg total) by mouth daily. Rip Harbour, NP Taking Active   esomeprazole (NEXIUM) 40 MG capsule [id number] Yes Take 40 mg by mouth daily. [provider] Taking Active Family Member  ezetimibe (ZETIA) 10 MG tablet [id number] Yes Take 0.5 tablets (5 mg total) by mouth daily. Rip Harbour, NP Taking Active   Fluticasone-Umeclidin-Vilant (TRELEGY ELLIPTA) 100-62.5-25 MCG/ACT AEPB [id number] Yes Inhale 1 puff into the lungs daily. Rip Harbour, NP Taking Active Family Member  gabapentin (NEURONTIN) 600 MG tablet [id number] Yes Take 1 tablet (600 mg total) by mouth at bedtime. Rip Harbour, NP Taking Active Family Member  Iron, Ferrous Sulfate, 325 (65 Fe) MG TABS [id number] Yes Take 325 mg by mouth daily. Rip Harbour, NP Taking Active Family Member  latanoprost (XALATAN) 0.005 % ophthalmic solution [id number] Yes Place 1 drop into both eyes at bedtime. [provider] Taking Active Family Member  Melatonin 3 MG CAPS [id number] Yes Take 3 mg by mouth at bedtime. [provider] Taking Active Family Member  midodrine (PROAMATINE) 10 MG tablet [id number] Yes Take 1 tablet (10 mg total) by mouth 3 (three) times daily. Rip Harbour, NP Taking Active Family Member  nitroGLYCERIN (NITROSTAT) 0.4 MG SL tablet [id number] Yes Place 1 tablet (0.4 mg total) under the tongue every 5 (five) minutes as needed for chest pain. Rafael Bihari, FNP Taking Active Family Member  OVER THE COUNTER MEDICATION [id number] Yes Apply 1  application  topically at bedtime. Medication: Mag Maxx Cream Back and restless leg [provider] Taking Active Family Member  potassium chloride SA (KLOR-CON M) 20 MEQ tablet [id number] Yes TAKE TWO TABLETS BY MOUTH ONCE DAILY Bensimhon, Shaune Pascal, MD Taking Active   spironolactone (ALDACTONE) 25 MG tablet [id number] Yes Take 1 tablet (25 mg total) by mouth daily. Bensimhon, Shaune Pascal, MD Taking Active Family Member  torsemide (DEMADEX) 20 MG tablet [id number] Yes TAKE THREE TABLETS BY MOUTH ONCE DAILY Bensimhon, Shaune Pascal, MD Taking Active   warfarin (COUMADIN) 1 MG tablet [id number] Yes Take 2 tablets to 3 tablets by mouth daily as directed by [facility] Bensimhon, Shaune Pascal, MD Taking Active Family Member           Med Note Cyndra Numbers Jul 21, 2022  9:54 AM)              SDOH:  (Social Determinants of Health) assessments and interventions performed: Yes SDOH Interventions    [place] Coordination from 07/04/2022 in [place] Coordination from 06/20/2022 in [place] Management from 05/22/2022 in [place] Management from 04/24/2022 in [place] Telephone from 04/18/2022 in [place] Management from 04/17/2022 in [place]  SDOH Interventions        Food Insecurity Interventions Other (Comment)  [Moms Meals, referral to meals on wheels] -- -- -- Other (Comment)  [Mailed food pantry list] Other (Comment)  [states it is getting harder to get food, is receptive to talking to care guides for food resources]  Housing Interventions Other (Comment)  [  Bathroom modifications and floor repairs needed - referred to [facility] in Nov 2023, vm left requesting status of referral] -- -- -- -- Other (Comment)  [has repairs that are needed in the home and cannot afford, open  to talking to careguides for assistance with resources in their area]  Transportation Interventions -- Other (Comment)  [Meds delivered] Other (Comment)  [Pharmacy delivering meds now] Intervention Not Indicated -- Intervention Not Indicated  Utilities Interventions -- -- -- -- Other (Comment)  [Mailed utility assistance resources] Ambulatory [id number] Order  [needs assistance with utilities]  Alcohol Usage Interventions -- -- -- -- -- Intervention Not Indicated (Score <7)  Financial Strain Interventions Other (Comment)  [Request assistance with [employer]] Other (Comment)  [PAP] Other (Comment)  [PAP's] Other (Comment)  [PAP] -- Other (Comment)  [careguide referral for resources in the area to help with financial assistance, food, and utilities]  Social Connections Interventions -- -- -- -- -- Intervention Not Indicated      SDOH Screenings   Food Insecurity: Food Insecurity Present (07/04/2022)  Housing: Low Risk  (07/04/2022)  Transportation Needs: Unmet Transportation Needs (06/20/2022)  Utilities: Not At Risk (04/18/2022)  Alcohol Screen: Low Risk  (04/17/2022)  Depression (PHQ2-9): Low Risk  (04/14/2022)  Financial Resource Strain: High Risk (07/04/2022)  Physical Activity: Sufficiently Active (04/14/2022)  Social Connections: Moderately Integrated (04/17/2022)  Stress: No Stress Concern Present (04/14/2022)  Tobacco Use: Medium Risk (07/21/2022)    Medication Assistance:  See CP   Name and location of Current pharmacy:  [facility], [address] [place] [address] Phone: [phone] Fax: [phone]  [facility] - [place], [place] - [address] [place] [address] Phone: [phone] Fax: [phone]    Compliance/Adherence/Medication fill history: Care Gaps: Last Annual Wellness visit? None Last Colonoscopy? None Last Mammogram? None Last Bone Denisty?  None AWV never done Shingrix overdue Colonoscopy Covid booster overdue   Last eye exam / retinopathy screening? [facility] this year Last diabetic foot exam? None   Assessment/Plan   CAD: (LDL goal < 70) - LHC 05/23: 100% m RCA and 90% m LAD.  - Echo (5/23) EF 25%, RV moderately reduced, moderate to severe MR - s/p CABG x 2 (LIMA-LAD, SVG-[place]amus INT)  (11/01/21) The ASCVD Risk score (Arnett DK, et al., 2019) failed to calculate for the following reasons:   The patient has a prior MI or stroke diagnosis Lab Results  Component Value Date   CHOL 75 03/18/2022   CHOL 131 01/06/2022   CHOL 194 07/09/2021   Lab Results  Component Value Date   HDL 33 (L) 03/18/2022   HDL 36 (L) 01/06/2022   HDL 55 07/09/2021   Lab Results  Component Value Date   LDLCALC 29 03/18/2022   LDLCALC 79 01/06/2022   LDLCALC 129 (H) 07/09/2021   Lab Results  Component Value Date   TRIG 63 03/18/2022   TRIG 79 01/06/2022   TRIG 55 07/09/2021   Lab Results  Component Value Date   CHOLHDL 2.3 03/18/2022   CHOLHDL 3.6 01/06/2022   CHOLHDL 3.5 07/09/2021   No results found for: "LDLDIRECT" Last vitamin D No results found for: "25OHVITD2", "25OHVITD3", "VD25OH" Lab Results  Component Value Date   TSH 1.660 07/21/2022   -Controlled -Current treatment: Atorvastatin '80mg'$  Appropriate, Effective, Safe, Accessible ASA '81mg'$  Appropriate, Effective, Safe, Accessible Ezetimibe '10mg'$  Appropriate, Effective, Safe, Accessible -Medications  previously tried: N/A  -Current dietary patterns: "Tries to eat healthy" -Current exercise habits: N/A -Educated on Cholesterol goals;  -Recommended to continue current medication   Pre-Diabetes (A1c goal <6.5%) Lab Results  Component Value Date   HGBA1C 6.0 (H) 03/18/2022   HGBA1C 6.0 (H) 11/01/2021   HGBA1C 5.8 (H) 07/09/2021   Lab Results  Component Value Date   LDLCALC 29 03/18/2022   CREATININE 1.98 (H) 07/21/2022    Lab Results  Component  Value Date   NA 131 (L) 07/21/2022   K 4.1 07/21/2022   CREATININE 1.98 (H) 07/21/2022   EGFR 36 (L) 06/11/2022   GFRNONAA 35 (L) 07/21/2022   GLUCOSE 112 (H) 07/21/2022    Lab Results  Component Value Date   WBC 5.0 07/21/2022   HGB 13.1 07/21/2022   HCT 38.9 (L) 07/21/2022   MCV 82.8 07/21/2022   PLT PLATELET CLUMPS NOTED ON SMEAR, UNABLE TO ESTIMATE 07/21/2022    No results found for: "LABMICR", "MICROALBUR"  No results found for: "LABMICR", "MICROALBUR" -Controlled -Current medications: Dapagliflozin Appropriate, Effective, Safe, Query accessible PAP Approved 2024 -Medications previously tried: N/A  -Current home glucose readings fasting glucose: Doesn't test -Denies hypoglycemic/hyperglycemic symptoms -Educated on A1c and blood sugar goals; -Counseled to check feet daily and get yearly eye exams -November 2023: Start PAP for Dapagliflozin December 2023: Dapagliflozin PAP submitted at beginning of month. Sent msg to [employer] via teams to call company to check on status   Atrial Fibrillation (Goal: prevent stroke and major bleeding) -Controlled -CHADSVASC: 6 -Current treatment: Rhythm control:  Amiodarone '100mg'$  QD Appropriate, Effective, Safe, Accessible Digoxin 0.'0625mg'$  Appropriate, Effective, Safe, Accessible Anticoagulation:  Warfarin UD Appropriate, Effective, Safe, Accessible -Medications previously tried: N/A -Home BP and HR readings: Doesn't test  -Counseled on increased risk of stroke due to Afib and benefits of anticoagulation for stroke prevention; November 2023: Will have CCM team look into potential of a DOAC and see if patient has spent 3% on meds. If so, will contact Cardio Jan 2024: Patient still hasn't brought in cost statement for DOAC. Sent msg to my team to call Pharmacy ourselves   Heart Failure (Goal: manage symptoms and prevent exacerbations) -Controlled -Last ejection fraction:  EF 25-30 % per ECHO, attends HF clinic s/p late presentation  inferior STEMI 05/23 Echo (5/23): EF 25-30%, RV moderately reduced, moderate to severe MR. 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 Echo 03/12/22 showed EF 20-25%,  -HF type: HFrEF (EF < 40%) -NYHA Class: IV (significant limitation of activity / dyspnea at rest) -AHA HF Stage: D (Advanced disease requiring aggressive medical therapy) -Current treatment: Dapagliflozin '10mg'$  Appropriate, Effective, Safe, Query accessible PAP Approved 2024 Digoxin 0.'625mg'$  Appropriate, Effective, Safe, Accessible Midrodine '10mg'$  TID Appropriate, Effective, Safe, Query accessible $100 at pharmacy Jan 2024: Will have team call to check on status of PAP KCl Appropriate, Effective, Safe, Accessible Spironolactone '25mg'$  Appropriate, Effective, Safe, Accessible Torsemide '80mg'$  Appropriate, Effective, Safe, Accessible -Medications previously tried: N/A  -Current home BP/HR readings: N/A -Current dietary habits: "Tries to eat healthy" -Current exercise habits: N/A -Educated on Benefits of medications for managing symptoms and prolonging life November 2023: Star Farxiga PAP. [place], spoke with Sonia Side, he's going to credit patient $40 (They accidentally messed up charging for patient. Unsure of details). Coordinated with Charlann Boxer is going to work on a grant for Spironolactone and Digoxin via direct message. Will get a month's  worth of samples to last December 2023: [place] the following direct msg, "Howdy! I believe you and I spoke on 04/24/22 about this patient and a grant. I didn't see a note and wanted to check on the status of things. Thank you and I hope you're having a wonderful day!" -Asked CCM team about [place] and ability to use    COPD (Goal: control symptoms and prevent exacerbations) -Controlled     No data to display         Tobacco Use:  Medium Risk (07/21/2022)   Patient History    Smoking Tobacco Use: Former    Smokeless Tobacco Use: Never    Passive Exposure: Not on file   -Current treatment  Albuterol Appropriate, Effective, Safe, Accessible Trelegy Appropriate, Effective, Safe, Query accessible PAP Started for 2023 on 04/22/22 -Medications previously tried: N/A  -Gold Grade: Unknown -Current COPD Classification:  Unknown -MMRC/CAT score:      No data to display         -Patient denies consistent use of maintenance inhaler -Counseled on [place] inhaler technique; November 2023: Stared PAP for Trelegy December 2023: Dapagliflozin PAP submitted at beginning of month. Sent msg to [facility] via teams to call company to check on status Jan 2024: Will have team call company to check on status   Cost of meds: [place]ine: $99.85 -$40 next time. Was credited Trelegy: 174 -PAP started Farxiga: 152 -PAP started Amiodarone: 14.88 Torsemide: 13.75 Warfarin: 10.75 Digoxin: 10.17 KCl: 7.17 Albuterol: 9.49  [place] Constable, Pharm.D. - [phone]

## 2022-08-04 ENCOUNTER — Ambulatory Visit: Payer: Self-pay

## 2022-08-04 ENCOUNTER — Telehealth: Payer: Self-pay

## 2022-08-04 NOTE — Patient Outreach (Signed)
  Care Coordination   Consult  Visit Note   08/04/2022 Name: Keith Jenkins MRN: [id number] DOB: Apr 08, 1946  Keith Jenkins is a 77 y.o. year old male who sees Cox, Kirsten, MD for primary care. I  spoke with [employer].    Goals Addressed             This Visit's Progress    Care Coordination Activities       Care Coordination Interventions: Inbound call from Butch Penny with [employer] to obtain instructions on how to obtain patients bill history in order to submit for grant SW to call Customer Service at [phone] after the next billing cycle to obtain         SDOH assessments and interventions completed:  No     Care Coordination Interventions:  Yes, provided   Interventions Today    Flowsheet Row Most Recent Value  Chronic Disease   Chronic disease during today's visit Congestive Heart Failure (CHF), Chronic Kidney Disease/End Stage Renal Disease (ESRD)  General Interventions   General Interventions Discussed/Reviewed Communication with  [[employer] to address [facility]DoH needs]        Follow up plan:  SW will follow up with patient as previously planned.    Encounter Outcome:  Pt. Visit Completed   [name], BSW, CDP Social Worker, Certified Dementia Practitioner [facility]  Care Coordination [phone]

## 2022-08-04 NOTE — Telephone Encounter (Signed)
INR due. [facility] nurse, [place]. No answer. Left detailed message on voicemail with anticoagulation clinic call back number.

## 2022-08-06 ENCOUNTER — Ambulatory Visit: Payer: Self-pay

## 2022-08-06 NOTE — Patient Outreach (Signed)
  Care Coordination   Follow Up Visit Note   08/06/2022 Name: Keith Jenkins MRN: [id number] DOB: 07/30/1945  Keith Jenkins is a 77 y.o. year old male who sees Cox, Kirsten, MD for primary care. I spoke with  Keith Jenkins by phone today.  What matters to the patients health and wellness today?  Navigation of benefits    Goals Addressed             This Visit's Progress    Care Coordination Activities       Care Coordination Interventions: Education provided on [employer] health plan benefits including over the counter benefit and extra benefits spending card Assisted the patient in contacted [employer] health plan to determine the patient may be eligible for an extra spending card of $180 per quarter if he qualifies for Extra Help Discussed plan for SW to collaborate with Pharmacist to determine patients eligibility for Extra Help Collaboration with Pharmacist Keith Jenkins to inquire if patient is eligible for Extra Help Advised the patient SW will follow up over the next month        SDOH assessments and interventions completed:  No     Care Coordination Interventions:  Yes, provided   Interventions Today    Flowsheet Row Most Recent Value  Chronic Disease   Chronic disease during today's visit Congestive Heart Failure (CHF), Chronic Kidney Disease/End Stage Renal Disease (ESRD)  General Interventions   General Interventions Discussed/Reviewed Communication with  Communication with Pharmacists  Education Interventions   Education Provided Provided Education  Provided Verbal Education On Insurance Plans        Follow up plan:  SW will collaborate with pharmacist and follow up with patient as appropriate    Encounter Outcome:  Pt. Visit Completed   Keith Jenkins, Keith Jenkins, CDP Social Worker, Certified Dementia Practitioner [facility] Care Management  Care Coordination [phone]

## 2022-08-06 NOTE — Patient Instructions (Signed)
Visit Information  Thank you for taking time to visit with me today. Please don't hesitate to contact me if I can be of assistance to you.   Following are the goals we discussed today:   Goals Addressed             This Visit's Progress    Care Coordination Activities       Care Coordination Interventions: Education provided on [employer] health plan benefits including over the counter benefit and extra benefits spending card Assisted the patient in contacted [employer] health plan to determine the patient may be eligible for an extra spending card of $180 per quarter if he qualifies for Extra Help Discussed plan for SW to collaborate with Pharmacist to determine patients eligibility for Extra Help Collaboration with Pharmacist Arizona Constable to inquire if patient is eligible for Extra Help Advised the patient SW will follow up over the next month        If you are experiencing a Mental Health or [place] or need someone to talk to, please call 911  Patient verbalizes understanding of instructions and care plan provided today and agrees to view in [place]. Active MyChart status and patient understanding of how to access instructions and care plan via MyChart confirmed with patient.     [name], BSW, CDP Social Worker, Certified Dementia Practitioner [place] [facility]  Care Coordination [phone]

## 2022-08-07 ENCOUNTER — Ambulatory Visit: Payer: Medicare HMO

## 2022-08-08 ENCOUNTER — Ambulatory Visit (HOSPITAL_COMMUNITY)
Admission: RE | Admit: 2022-08-08 | Discharge: 2022-08-08 | Disposition: A | Discharge status: Home / Self Care | Payer: Medicare HMO | Source: Ambulatory Visit | Attending: Cardiology | Admitting: Cardiology

## 2022-08-08 DIAGNOSIS — I5022 Chronic systolic (congestive) heart failure: Secondary | ICD-10-CM | POA: Diagnosis not present

## 2022-08-08 LAB — BASIC METABOLIC PANEL
Anion gap: 11 (ref 5–15)
BUN: 39 mg/dL — ABNORMAL HIGH (ref 8–23)
CO2: 27 mmol/L (ref 22–32)
Calcium: 9 mg/dL (ref 8.9–10.3)
Chloride: 93 mmol/L — ABNORMAL LOW (ref 98–111)
Creatinine, Ser: 2.28 mg/dL — ABNORMAL HIGH (ref 0.61–1.24)
GFR, Estimated: 29 mL/min — ABNORMAL LOW (ref >60)
Glucose, Bld: 93 mg/dL (ref 70–99)
Potassium: 4.2 mmol/L (ref 3.5–5.1)
Sodium: 131 mmol/L — ABNORMAL LOW (ref 135–145)

## 2022-08-08 LAB — BRAIN NATRIURETIC PEPTIDE: B Natriuretic Peptide: 1638.3 pg/mL — ABNORMAL HIGH (ref 0.0–100.0)

## 2022-08-08 NOTE — Telephone Encounter (Signed)
[facility] Nurse called and confirmed that she will obtain a POC INR on Monday, March 4th.

## 2022-08-11 ENCOUNTER — Ambulatory Visit (INDEPENDENT_AMBULATORY_CARE_PROVIDER_SITE_OTHER): Payer: Medicare HMO | Admitting: *Deleted

## 2022-08-11 DIAGNOSIS — Z7901 Long term (current) use of anticoagulants: Secondary | ICD-10-CM

## 2022-08-11 DIAGNOSIS — I5023 Acute on chronic systolic (congestive) heart failure: Secondary | ICD-10-CM | POA: Diagnosis not present

## 2022-08-11 DIAGNOSIS — N1832 Chronic kidney disease, stage 3b: Secondary | ICD-10-CM | POA: Diagnosis not present

## 2022-08-11 DIAGNOSIS — E785 Hyperlipidemia, unspecified: Secondary | ICD-10-CM | POA: Diagnosis not present

## 2022-08-11 DIAGNOSIS — Z952 Presence of prosthetic heart valve: Secondary | ICD-10-CM | POA: Diagnosis not present

## 2022-08-11 DIAGNOSIS — D509 Iron deficiency anemia, unspecified: Secondary | ICD-10-CM | POA: Diagnosis not present

## 2022-08-11 DIAGNOSIS — J449 Chronic obstructive pulmonary disease, unspecified: Secondary | ICD-10-CM | POA: Diagnosis not present

## 2022-08-11 DIAGNOSIS — Z7982 Long term (current) use of aspirin: Secondary | ICD-10-CM | POA: Diagnosis not present

## 2022-08-11 DIAGNOSIS — K222 Esophageal obstruction: Secondary | ICD-10-CM | POA: Diagnosis not present

## 2022-08-11 DIAGNOSIS — Z954 Presence of other heart-valve replacement: Secondary | ICD-10-CM | POA: Diagnosis not present

## 2022-08-11 DIAGNOSIS — I252 Old myocardial infarction: Secondary | ICD-10-CM | POA: Diagnosis not present

## 2022-08-11 DIAGNOSIS — R1319 Other dysphagia: Secondary | ICD-10-CM | POA: Diagnosis not present

## 2022-08-11 DIAGNOSIS — K219 Gastro-esophageal reflux disease without esophagitis: Secondary | ICD-10-CM | POA: Diagnosis not present

## 2022-08-11 DIAGNOSIS — Z87891 Personal history of nicotine dependence: Secondary | ICD-10-CM | POA: Diagnosis not present

## 2022-08-11 DIAGNOSIS — I2510 Atherosclerotic heart disease of native coronary artery without angina pectoris: Secondary | ICD-10-CM | POA: Diagnosis not present

## 2022-08-11 DIAGNOSIS — I480 Paroxysmal atrial fibrillation: Secondary | ICD-10-CM | POA: Diagnosis not present

## 2022-08-11 DIAGNOSIS — M1990 Unspecified osteoarthritis, unspecified site: Secondary | ICD-10-CM | POA: Diagnosis not present

## 2022-08-11 DIAGNOSIS — Z951 Presence of aortocoronary bypass graft: Secondary | ICD-10-CM | POA: Diagnosis not present

## 2022-08-11 DIAGNOSIS — H409 Unspecified glaucoma: Secondary | ICD-10-CM | POA: Diagnosis not present

## 2022-08-11 DIAGNOSIS — Z9181 History of falling: Secondary | ICD-10-CM | POA: Diagnosis not present

## 2022-08-11 DIAGNOSIS — I350 Nonrheumatic aortic (valve) stenosis: Secondary | ICD-10-CM | POA: Diagnosis not present

## 2022-08-11 LAB — POCT INR: INR: 2.5 (ref 2.0–3.0)

## 2022-08-11 NOTE — Patient Instructions (Signed)
Description   Spoke with Keith Jenkins), [facility] and pt. Instructed for pt to continue taking 3 tablets daily. Recheck INR in 3 weeks.  [facility] [phone] Fax Clearance form to [phone]

## 2022-08-14 DIAGNOSIS — K222 Esophageal obstruction: Secondary | ICD-10-CM | POA: Diagnosis not present

## 2022-08-14 DIAGNOSIS — Z7901 Long term (current) use of anticoagulants: Secondary | ICD-10-CM | POA: Diagnosis not present

## 2022-08-14 DIAGNOSIS — H409 Unspecified glaucoma: Secondary | ICD-10-CM | POA: Diagnosis not present

## 2022-08-14 DIAGNOSIS — R1319 Other dysphagia: Secondary | ICD-10-CM | POA: Diagnosis not present

## 2022-08-14 DIAGNOSIS — Z87891 Personal history of nicotine dependence: Secondary | ICD-10-CM | POA: Diagnosis not present

## 2022-08-14 DIAGNOSIS — Z9181 History of falling: Secondary | ICD-10-CM | POA: Diagnosis not present

## 2022-08-14 DIAGNOSIS — J449 Chronic obstructive pulmonary disease, unspecified: Secondary | ICD-10-CM | POA: Diagnosis not present

## 2022-08-14 DIAGNOSIS — D509 Iron deficiency anemia, unspecified: Secondary | ICD-10-CM | POA: Diagnosis not present

## 2022-08-14 DIAGNOSIS — M1990 Unspecified osteoarthritis, unspecified site: Secondary | ICD-10-CM | POA: Diagnosis not present

## 2022-08-14 DIAGNOSIS — I2510 Atherosclerotic heart disease of native coronary artery without angina pectoris: Secondary | ICD-10-CM | POA: Diagnosis not present

## 2022-08-14 DIAGNOSIS — K219 Gastro-esophageal reflux disease without esophagitis: Secondary | ICD-10-CM | POA: Diagnosis not present

## 2022-08-14 DIAGNOSIS — Z951 Presence of aortocoronary bypass graft: Secondary | ICD-10-CM | POA: Diagnosis not present

## 2022-08-14 DIAGNOSIS — I252 Old myocardial infarction: Secondary | ICD-10-CM | POA: Diagnosis not present

## 2022-08-14 DIAGNOSIS — I5023 Acute on chronic systolic (congestive) heart failure: Secondary | ICD-10-CM | POA: Diagnosis not present

## 2022-08-14 DIAGNOSIS — Z7982 Long term (current) use of aspirin: Secondary | ICD-10-CM | POA: Diagnosis not present

## 2022-08-14 DIAGNOSIS — I350 Nonrheumatic aortic (valve) stenosis: Secondary | ICD-10-CM | POA: Diagnosis not present

## 2022-08-14 DIAGNOSIS — N1832 Chronic kidney disease, stage 3b: Secondary | ICD-10-CM | POA: Diagnosis not present

## 2022-08-14 DIAGNOSIS — Z954 Presence of other heart-valve replacement: Secondary | ICD-10-CM | POA: Diagnosis not present

## 2022-08-14 DIAGNOSIS — I480 Paroxysmal atrial fibrillation: Secondary | ICD-10-CM | POA: Diagnosis not present

## 2022-08-14 DIAGNOSIS — E785 Hyperlipidemia, unspecified: Secondary | ICD-10-CM | POA: Diagnosis not present

## 2022-08-15 ENCOUNTER — Other Ambulatory Visit (HOSPITAL_COMMUNITY): Payer: Self-pay

## 2022-08-15 ENCOUNTER — Telehealth (HOSPITAL_COMMUNITY): Payer: Self-pay

## 2022-08-15 NOTE — Telephone Encounter (Signed)
HH orders signed and successfully faxed to [facility].

## 2022-08-19 ENCOUNTER — Telehealth: Payer: Self-pay | Admitting: Pharmacist

## 2022-08-19 ENCOUNTER — Ambulatory Visit: Payer: Self-pay

## 2022-08-19 NOTE — Progress Notes (Signed)
[facility])  [place] Team  08/19/2022  Deklin Kilmartin 02-18-1946 [id number]  Reason for referral:  Apply for the Medicare Extra Help Program  Telephone call placed to Mr. Crew Ocana today, spoke with Mr. Michalski spouse, Sajan Maloof. HIPAA identifiers were confirmed.   Discussed the Medicare Extra Help Program with Mrs. Landry Mellow and proceeded with application for both Mr. And Mrs. Seaberg for the Extra Help Program over the telephone. Received a successful submission from the Social Security administration on completion of the application.  Informed Mrs. Duecker that the [employer] will notify them by mail of the determination typically within 30 to 45 days. Mrs. Pezza was appreciative of my call.    [facility] is happy to assist the patient/family in the future for clinical pharmacy needs, following a discussion from your team about [place] outreach.    Thank you for allowing [facility] pharmacy to be a part of this patient's care.         Loretha Brasil, PharmD [place] Clinical Pharmacist Direct Dial: [phone]

## 2022-08-19 NOTE — Patient Instructions (Signed)
Visit Information  Thank you for taking time to visit with me today. Please don't hesitate to contact me if I can be of assistance to you.   Following are the goals we discussed today:   Goals Addressed             This Visit's Progress    Care Coordination Activities       Care Coordination Interventions: Collaboration with [place] pharmacsit Tiffany Benfield who is available to assist patient with extra help application if desired Spoke with patient and his spouse who are interested in speaking with PharmD; referral placed        If you are experiencing a [place] or [place] or need someone to talk to, please call 911  Patient verbalizes understanding of instructions and care plan provided today and agrees to view in [place]. Active MyChart status and patient understanding of how to access instructions and care plan via MyChart confirmed with patient.     [name], BSW, CDP Social Worker, Certified Dementia Practitioner [place] [facility]  Care Coordination [phone]

## 2022-08-19 NOTE — Patient Outreach (Signed)
  Care Coordination   Follow Up Visit Note   08/19/2022 Name: Joneric Streight MRN: [id number] DOB: [birth year]  Alexandr Yaworski is a 77 y.o. year old male who sees Cox, Kirsten, MD for primary care. I spoke with  Mechele Collin by phone today.  What matters to the patients health and wellness today?  Patient would like to apply for extra help    Goals Addressed             This Visit's Progress    Care Coordination Activities       Care Coordination Interventions: Collaboration with [facility] pharmacsit Tiffany Benfield who is available to assist patient with extra help application if desired Spoke with patient and his spouse who are interested in speaking with PharmD; referral placed        SDOH assessments and interventions completed:  No     Care Coordination Interventions:  Yes, provided   Interventions Today    Flowsheet Row Most Recent Value  Chronic Disease   Chronic disease during today's visit Congestive Heart Failure (CHF), Chronic Kidney Disease/End Stage Renal Disease (ESRD)  General Interventions   General Interventions Discussed/Reviewed General Interventions Reviewed  Pharmacy Interventions   Pharmacy Dicussed/Reviewed Referral to Pharmacist  Referral to Pharmacist Cannot afford medications        Follow up plan:  SW will continue to follow    Encounter Outcome:  Pt. Visit Completed   [name], Arita Miss, CDP Social Worker, Certified Dementia Practitioner [facility] Care Management  Care Coordination [phone]

## 2022-08-19 NOTE — Patient Outreach (Signed)
  Care Coordination   Documentation  Visit Note   08/19/2022 Name: Keith Jenkins MRN: [id number] DOB: 1946/05/24  Keith Jenkins is a 77 y.o. year old male who sees Cox, Kirsten, MD for primary care. I  followed up with utility companies to confirm payment received.   Goals Addressed             This Visit's Progress    Care Coordination Activities       Care Coordination Interventions: Collaboration with pharmacist Arizona Constable who indicates he is unsure how to determine if the patient is eligible for extra help Collaboration with [place] manager to request feedback on how to assess for patient eligibility Communication with both [employer] and [employer] to confirm payment received from [employer], payments have been applied to patients account        SDOH assessments and interventions completed:  No     Care Coordination Interventions:  Yes, provided   Interventions Today    Flowsheet Row Most Recent Value  Chronic Disease   Chronic disease during today's visit Congestive Heart Failure (CHF), Chronic Kidney Disease/End Stage Renal Disease (ESRD)  General Interventions   General Interventions Discussed/Reviewed Communication with  Communication with Pharmacists  [Pharmacist re: extra help and Utility Company to confirm receipt of payment]       Follow up plan:  SW will continue to follow to confirm patient engaged with community resource to assist with bathroom repair.    Encounter Outcome:  Pt. Visit Completed   [name], BSW, CDP Social Worker, Certified Dementia Practitioner [place] Management  Care Coordination [phone]

## 2022-08-20 ENCOUNTER — Other Ambulatory Visit (HOSPITAL_COMMUNITY): Payer: Self-pay | Admitting: Internal Medicine

## 2022-08-21 DIAGNOSIS — Z954 Presence of other heart-valve replacement: Secondary | ICD-10-CM | POA: Diagnosis not present

## 2022-08-21 DIAGNOSIS — K222 Esophageal obstruction: Secondary | ICD-10-CM | POA: Diagnosis not present

## 2022-08-21 DIAGNOSIS — Z951 Presence of aortocoronary bypass graft: Secondary | ICD-10-CM | POA: Diagnosis not present

## 2022-08-21 DIAGNOSIS — E785 Hyperlipidemia, unspecified: Secondary | ICD-10-CM | POA: Diagnosis not present

## 2022-08-21 DIAGNOSIS — Z7982 Long term (current) use of aspirin: Secondary | ICD-10-CM | POA: Diagnosis not present

## 2022-08-21 DIAGNOSIS — N1832 Chronic kidney disease, stage 3b: Secondary | ICD-10-CM | POA: Diagnosis not present

## 2022-08-21 DIAGNOSIS — I480 Paroxysmal atrial fibrillation: Secondary | ICD-10-CM | POA: Diagnosis not present

## 2022-08-21 DIAGNOSIS — J449 Chronic obstructive pulmonary disease, unspecified: Secondary | ICD-10-CM | POA: Diagnosis not present

## 2022-08-21 DIAGNOSIS — H409 Unspecified glaucoma: Secondary | ICD-10-CM | POA: Diagnosis not present

## 2022-08-21 DIAGNOSIS — I5023 Acute on chronic systolic (congestive) heart failure: Secondary | ICD-10-CM | POA: Diagnosis not present

## 2022-08-21 DIAGNOSIS — Z87891 Personal history of nicotine dependence: Secondary | ICD-10-CM | POA: Diagnosis not present

## 2022-08-21 DIAGNOSIS — M1990 Unspecified osteoarthritis, unspecified site: Secondary | ICD-10-CM | POA: Diagnosis not present

## 2022-08-21 DIAGNOSIS — I2510 Atherosclerotic heart disease of native coronary artery without angina pectoris: Secondary | ICD-10-CM | POA: Diagnosis not present

## 2022-08-21 DIAGNOSIS — I252 Old myocardial infarction: Secondary | ICD-10-CM | POA: Diagnosis not present

## 2022-08-21 DIAGNOSIS — K219 Gastro-esophageal reflux disease without esophagitis: Secondary | ICD-10-CM | POA: Diagnosis not present

## 2022-08-21 DIAGNOSIS — D509 Iron deficiency anemia, unspecified: Secondary | ICD-10-CM | POA: Diagnosis not present

## 2022-08-21 DIAGNOSIS — Z9181 History of falling: Secondary | ICD-10-CM | POA: Diagnosis not present

## 2022-08-21 DIAGNOSIS — Z7901 Long term (current) use of anticoagulants: Secondary | ICD-10-CM | POA: Diagnosis not present

## 2022-08-21 DIAGNOSIS — I350 Nonrheumatic aortic (valve) stenosis: Secondary | ICD-10-CM | POA: Diagnosis not present

## 2022-08-21 DIAGNOSIS — R1319 Other dysphagia: Secondary | ICD-10-CM | POA: Diagnosis not present

## 2022-08-22 ENCOUNTER — Telehealth: Payer: Self-pay

## 2022-08-22 NOTE — Progress Notes (Signed)
Care Management & Coordination Services Pharmacy Team  Reason for Encounter: Medication coordination and delivery  Contacted patient to discuss medications and coordinate delivery from [facility]. Spoke with family on 08/22/2022  [facility] dispensing form sent to Pattricia Boss for review.   Last adherence delivery date: 08-05-2022  Patient is due for next adherence delivery on: 09-02-2022  This delivery to include: Vials  30 Days  Amiodarone 100 mg daily Atorvastatin 80 mg at Bedtime Digoxin 0.125 mg half tablet daily Lexapro 10 mg daily Zetia 10 mg half tablet daily Gabapentin 600 mg at Bedtime Potassium chloride 20 meq 2 tablets daily Spironolactone 25 mg daily Warfarin 1 mg 2-3 tabs daily as directed  Aspirin 81 mg daily Torsemide 20 mg 4 tablets daily  Patient declined the following medications this month: Midodrine- PAP Proventil- Plenty supply Ventolin- plenty supply Trelegy- PAP  Farxiga- PAP  No refill request needed.  Confirmed delivery date of 09-02-2022, advised patient that pharmacy will contact them the morning of delivery.   Any concerns about your medications? No  How often do you forget or accidentally miss a dose? Never  Do you use a pillbox? Yes  Is patient in packaging No  Recent blood pressure readings are as follows: Patient's wife stated she hasn't started documenting blood pressure readings from nurse due to a new issue that's going on with her personal health.  Recent blood glucose readings are as follows: None at this time   Chart review: Recent office visits:  08-19-2022 Wilber, [place], [facility])  08-19-2022 [name] [facility])  08-06-2022 [name] [facility])  08-04-2022 [name] [facility])  Recent consult visits:  08-11-2022 Marcos Eke, RN (Cardiology). Anti Coag visit  08-08-2022 Larey Dresser, MD (Cardiology). Sodium= 131, Chloride= 93, BUN= 39, Creatinine= 2.28, eGFR= 29. B Natriuretic Peptide=  1,638.3.  Hospital visits:  None in previous 6 months  Medications: Outpatient Encounter Medications as of 08/22/2022  Medication Sig   albuterol (PROVENTIL) (2.5 MG/3ML) 0.083% nebulizer solution Inhale 3 mLs into the lungs every 6 (six) hours as needed for wheezing or shortness of breath.   albuterol (VENTOLIN HFA) 108 (90 Base) MCG/ACT inhaler Inhale 2 puffs into the lungs every 6 (six) hours as needed for wheezing or shortness of breath.   amiodarone (PACERONE) 200 MG tablet Take 0.5 tablets (100 mg total) by mouth daily.   aspirin EC 81 MG tablet Take 1 tablet (81 mg total) by mouth daily. Swallow whole.   atorvastatin (LIPITOR) 80 MG tablet TAKE ONE TABLET BY MOUTH EVERYDAY AT BEDTIME   cyanocobalamin (VITAMIN B12) 1000 MCG/ML injection Inject 1 mL (1,000 mcg total) into the muscle every 30 (thirty) days.   dapagliflozin propanediol (FARXIGA) 10 MG TABS tablet Take 1 tablet (10 mg total) by mouth daily.   digoxin (LANOXIN) 0.125 MG tablet TAKE 1/2 TABLET BY MOUTH ONCE DAILY   escitalopram (LEXAPRO) 10 MG tablet Take 1 tablet (10 mg total) by mouth daily.   esomeprazole (NEXIUM) 40 MG capsule Take 40 mg by mouth daily.   ezetimibe (ZETIA) 10 MG tablet Take 0.5 tablets (5 mg total) by mouth daily.   Fluticasone-Umeclidin-Vilant (TRELEGY ELLIPTA) 100-62.5-25 MCG/ACT AEPB Inhale 1 puff into the lungs daily.   gabapentin (NEURONTIN) 600 MG tablet Take 1 tablet (600 mg total) by mouth at bedtime.   Iron, Ferrous Sulfate, 325 (65 Fe) MG TABS Take 325 mg by mouth daily.   latanoprost (XALATAN) 0.005 % ophthalmic solution Place 1 drop into both eyes at bedtime.  Melatonin 3 MG CAPS Take 3 mg by mouth at bedtime.   midodrine (PROAMATINE) 10 MG tablet Take 1 tablet (10 mg total) by mouth 3 (three) times daily.   nitroGLYCERIN (NITROSTAT) 0.4 MG SL tablet Place 1 tablet (0.4 mg total) under the tongue every 5 (five) minutes as needed for chest pain.   OVER THE COUNTER MEDICATION Apply 1  application  topically at bedtime. Medication: Mag Maxx Cream Back and restless leg   potassium chloride SA (KLOR-CON M) 20 MEQ tablet TAKE TWO TABLETS BY MOUTH ONCE DAILY   spironolactone (ALDACTONE) 25 MG tablet Take 1 tablet (25 mg total) by mouth daily.   torsemide (DEMADEX) 20 MG tablet Take 4 tablets (80 mg total) by mouth daily.   warfarin (COUMADIN) 1 MG tablet Take 2 tablets to 3 tablets by mouth daily as directed by Coumadin Clinic   No facility-administered encounter medications on file as of 08/22/2022.   BP Readings from Last 3 Encounters:  07/21/22 112/70  06/19/22 95/71  06/11/22 (!) 140/90    Pulse Readings from Last 3 Encounters:  07/21/22 65  06/19/22 (!) 58  06/11/22 74    Lab Results  Component Value Date/Time   HGBA1C 6.0 (H) 03/18/2022 05:58 AM   HGBA1C 6.0 (H) 11/01/2021 04:30 AM   Lab Results  Component Value Date   CREATININE 2.28 (H) 08/08/2022   BUN 39 (H) 08/08/2022   GFRNONAA 29 (L) 08/08/2022   GFRAA 65 03/27/2020   NA 131 (L) 08/08/2022   K 4.2 08/08/2022   CALCIUM 9.0 08/08/2022   CO2 27 08/08/2022    [place] Clinical Pharmacist Assistant [phone]

## 2022-08-26 ENCOUNTER — Telehealth: Payer: Self-pay

## 2022-08-26 NOTE — Progress Notes (Signed)
Care Management & Coordination Services Pharmacy Team  Reason for Encounter: Appointment Reminder  Contacted patient to confirm telephone appointment with Arizona Constable, PharmD on 08/28/22 at 2:00pm.  Spoke with family on 08/26/2022   Do you have any problems getting your medications? No  What is your top health concern you would like to discuss at your upcoming visit?  No top health concern  Have you seen any other providers since your last visit with PCP? No   Chart review:  Recent office visits:  None  Recent consult visits:  None  Hospital visits:  None   Star Rating Drugs:  Medication:  Last Fill: Day Supply Atorvastatin   07/31/22-07/07/22 30ds Farxiga (PAP)  LF 08/05/22 90ds  Care Gaps: Annual wellness visit in last year? No  If Diabetic:None noted  Last eye exam / retinopathy screening: Last diabetic foot exam:   Elray Mcgregor, [place] Pharmacist Assistant  [phone]

## 2022-08-27 ENCOUNTER — Ambulatory Visit: Payer: Self-pay

## 2022-08-27 NOTE — Patient Instructions (Signed)
Visit Information  Thank you for taking time to visit with me today. Please don't hesitate to contact me if I can be of assistance to you.   Following are the goals we discussed today:   Goals Addressed             This Visit's Progress    Care Coordination Activities       Care Coordination Interventions: Performed chart review to note patients referral to [facility] for bathroom modifications was accepted by both agencies Outbound call placed to the patient to confirm if either agency has been in contact with him; spoke with patients spouse who is unable to complete today's call due to not feeling well Discussed plan for SW to follow up over the next two weeks        If you are experiencing a Mental Health or [place] or need someone to talk to, please call 911  Patient verbalizes understanding of instructions and care plan provided today and agrees to view in [place]. Active MyChart status and patient understanding of how to access instructions and care plan via MyChart confirmed with patient.     [name], BSW, CDP Social Worker, Certified Dementia Practitioner [place] Management  Care Coordination [phone]

## 2022-08-27 NOTE — Patient Outreach (Signed)
  Care Coordination   Follow Up Visit Note   08/27/2022 Name: Keith Jenkins MRN: [id number] DOB: [birth year]  Keith Jenkins is a 77 y.o. year old male who sees Cox, Kirsten, MD for primary care. I  spoke with patients spouse by phone.   Goals Addressed             This Visit's Progress    Care Coordination Activities       Care Coordination Interventions: Performed chart review to note patients referral to [facility] for bathroom modifications was accepted by both agencies Outbound call placed to the patient to confirm if either agency has been in contact with him; spoke with patients spouse who is unable to complete today's call due to not feeling well Discussed plan for SW to follow up over the next two weeks        SDOH assessments and interventions completed:  No   Care Coordination Interventions:  Yes, provided   Interventions Today    Flowsheet Row Most Recent Value  Chronic Disease   Chronic disease during today's visit Congestive Heart Failure (CHF), Chronic Kidney Disease/End Stage Renal Disease (ESRD)  General Interventions   General Interventions Discussed/Reviewed General Interventions Reviewed        Follow up plan:  SW will continue to follow.    Encounter Outcome:  Pt. Visit Completed   [name], BSW, CDP Social Worker, Certified Dementia Practitioner [place] Management  Care Coordination [phone]

## 2022-08-28 ENCOUNTER — Ambulatory Visit: Payer: Medicare HMO

## 2022-08-28 ENCOUNTER — Telehealth: Payer: Self-pay

## 2022-08-28 ENCOUNTER — Other Ambulatory Visit (HOSPITAL_COMMUNITY): Payer: Self-pay | Admitting: Internal Medicine

## 2022-08-28 ENCOUNTER — Telehealth (HOSPITAL_COMMUNITY): Payer: Self-pay

## 2022-08-28 DIAGNOSIS — Z952 Presence of prosthetic heart valve: Secondary | ICD-10-CM

## 2022-08-28 MED ORDER — WARFARIN SODIUM 1 MG PO TABS: ORAL_TABLET | ORAL | 0 refills | Status: DC

## 2022-08-28 MED ORDER — WARFARIN SODIUM 1 MG PO TABS: ORAL_TABLET | ORAL | 1 refills | Status: DC

## 2022-08-28 NOTE — Addendum Note (Signed)
Addended by: Leonidas Romberg on: 08/28/2022 10:19 AM   Modules accepted: Orders

## 2022-08-28 NOTE — Patient Outreach (Addendum)
Care Management & Coordination Services Pharmacy Note  08/28/2022 Name:  Keith Jenkins MRN:  [id number] DOB:  25-Aug-1945  Summary: -Very very very pleasant couple presents for f/u CCM visit. They have been married for 56 years. He worked as a Administrator. [name], his wife, grew up in [place]. Writes poems. Read me one she just finished called "Dust on the Bible"  Recommendations/Changes made from today's visit: -Recommend PFT's, no recent labs for patient -He does state he gets a little dizzy in the morning but it goes away after 5 minutes. He thinks it's due to Gabapentin. Will co-sign PCP (Please see note for BP readings, states it does get low sometimes). Patient has Hx of falls -Patient states his weight past week is 170-171 lbs but weight was 187 in office, will send msg to Cardio.  Wt Readings from Last 3 Encounters:  07/21/22 187 lb 6.4 oz (85 kg)  06/19/22 205 lb 0.4 oz (93 kg)  06/11/22 205 lb (93 kg)      Plan Updates: -Social Worker Tillie Rung has patient approved for both Meals On Wheels AND a grant for heating/electric!! -MARCH UPDATE: Patient bill was covered for last month and getting Meals on Wheels daily and Mom's Meal's every 30 days. States no longer concerned about when next meal is  -Bathroom is next per SW   Subjective: Keith Jenkins is an 77 y.o. year old male who is a primary patient of Cox, Kirsten, MD.  The care coordination team was consulted for assistance with disease management and care coordination needs.    Engaged with patient by telephone for follow up visit.  Recent office visits:  04-17-2022 [name], RN (CCM).   04-14-2022 Rip Harbour, NP. START Gabapentin 600 mg at bedtime.    02-19-2022 Rochel Brome, MD. Flu vaccine given.    01-06-2022 Rip Harbour, NP. Hemo= 10.3, Hema= 32.2, MCV= 75, MCH= 24.0, RDW= 17.1. Creatinine= 1.32, eGFR= 57, Chloride= 93. HDL= 36   Recent consult visits:  04-15-2022 Leonidas Romberg, RN  (Cardiology). Anti coag visit.   04-08-2022 Jerene Bears, MD. ESOPHAGOGASTRODUODENOSCOPY (EGD) WITH PROPOFOL procedure.    04-01-2022 Leonidas Romberg, RN (Cardiology). Anti coag visit.   03-31-2022 Rafael Bihari, FNP (Vascular). B Natriuretic Peptide= 1,681.5. Sodium= 134, Glucose= 135, BUN= 32, Creatinine= 1.91, Calcium= 8.7, GFR, Estimated= 36. RBC= 4.10, Hemo= 10.2, HCT= 30.7, MCV= 74.9, MCH= 24.9, RDW= 29.9.    03-25-2022 Leonidas Romberg, RN (Cardiology). Anti coag visit.   03-12-2022 Bensimhon, Shaune Pascal, MD  (Cardiology). RIGHT HEART CATH and THORACENTESIS procedures.   03-12-2022 Rafael Bihari, FNP (Vascular). B natriuretic peptide= 1,417.5. RBC= 3.96, Hemo= 8.5, HCT= 26.9, MCV= 67.9, MCH= 21.5, RDW= 19.2. BUN= 34, Creatinine= 1.96, Albumin= 3.2, Egfr= 35.   03-11-2022 Leonidas Romberg, RN (Cardiology). Anti coag visit.   03-04-2022 Leonidas Romberg, RN (Cardiology). Anti coag visit.   02-25-2022 Leonidas Romberg, RN (Cardiology). Anti coag visit.   02-18-2022 Leonidas Romberg, RN (Cardiology). Anti coag visit.   02-14-2022 Rafael Bihari, FNP. B natriuretic peptide= 1, 321.3. Glucose= 100, Creatinine= 1.59, Gfr= 45.   02-11-2022 Hemphill, Candance B, RN (Cardiology). Anti coag visit.   02-04-2022 Hemphill, Myrtis Hopping, RN (Cardiology). Anti coag visit.   02-04-2022 Pyrtle, Lajuan Lines, MD Gertie Fey). Follow up visit.   01-22-2022 Gaye Pollack, MD (Cardiothoracic Surgery). Follow up.   01-21-2022 Leonidas Romberg, RN (Cardiology). Anti coag visit.   01-14-2022 Leonidas Romberg, RN (Cardiology).  Anti coag visit.   01-14-2022 Scarlette Calico, RN (Vascular). DECREASE Midodrine to 5 mg Three times a day and START Zetia 5 mg (1/2 tab) Daily.   01-07-2022 Leonidas Romberg, RN (Cardiology). Anti coag visit.   12-31-2021 Leonidas Romberg, RN (Cardiology). Anti coag visit.   12-24-2021 Leonidas Romberg, RN (Cardiology). Anti coag visit.   12-17-2021  Leonidas Romberg, RN (Cardiology). Anti coag visit.   12-11-2021 Leonidas Romberg, RN (Cardiology). Anti coag visit.   12-05-2021 Gaye Pollack, MD (Cardiothoracic). Follow up visit.   12-04-2021 Rafael Bihari, FNP (Vascular). B Natriuretic Peptide= 1,268.7. Chloride= 96, Creatinine= 1.36, Calcium= 8.5, Egfr= 55.   12-03-2021 Leonidas Romberg, RN (Cardiology). Anti coag visit.   11-26-2021 Leonidas Romberg, RN (Cardiology). Anti coag visit.   Hospital visits:  Medication Reconciliation was completed by comparing discharge summary, patient's EMR and Pharmacy list, and upon discussion with patient.   Admitted to the hospital on 02-20-2022 due to Fall encounter. Discharge date was 02-20-2022. Discharged from [facility]?Medications Started at [facility] Discharge:?? None   Medication Changes at Hospital Discharge: None   Medications Discontinued at Hospital Discharge: None   Medications that remain the same after Hospital Discharge:??  -All other medications will remain the same.     Hospital visits:  Medication Reconciliation was completed by comparing discharge summary, patient's EMR and Pharmacy list, and upon discussion with patient.   Admitted to the hospital on 02-12-2022 due to Scalp laceration. Discharge date was 02-12-2022. Discharged from [place]?Medications Started at [facility] Discharge:?? None   Medication Changes at Hospital Discharge: None   Medications Discontinued at Hospital Discharge: None   Medications that remain the same after Hospital Discharge:??  -All other medications will remain the same.     Hospital visits:  Medication Reconciliation was completed by comparing discharge summary, patient's EMR and Pharmacy list, and upon discussion with patient.   Admitted to the hospital on 01-28-2022 due to Fall encounter. Discharge date was 01-28-2022. Discharged from [place]?Medications  Started at [facility] Discharge:?? None   Medication Changes at Hospital Discharge: None   Medications Discontinued at Hospital Discharge: None   Medications that remain the same after Hospital Discharge:??  -All other medications will remain the same.     Objective:  Lab Results  Component Value Date   CREATININE 2.28 (H) 08/08/2022   BUN 39 (H) 08/08/2022   EGFR 36 (L) 06/11/2022   GFRNONAA 29 (L) 08/08/2022   GFRAA 65 03/27/2020   NA 131 (L) 08/08/2022   K 4.2 08/08/2022   CALCIUM 9.0 08/08/2022   CO2 27 08/08/2022   GLUCOSE 93 08/08/2022    Lab Results  Component Value Date/Time   HGBA1C 6.0 (H) 03/18/2022 05:58 AM   HGBA1C 6.0 (H) 11/01/2021 04:30 AM    Last diabetic Eye exam: No results found for: "HMDIABEYEEXA"  Last diabetic Foot exam: No results found for: "HMDIABFOOTEX"   Lab Results  Component Value Date   CHOL 75 03/18/2022   HDL 33 (L) 03/18/2022   LDLCALC 29 03/18/2022   TRIG 63 03/18/2022   CHOLHDL 2.3 03/18/2022       Latest Ref Rng & Units 07/21/2022   10:37 AM 06/11/2022   11:07 AM 03/18/2022    5:56 AM  Hepatic Function  Total Protein 6.5 - 8.1 g/dL 7.5  7.0  6.9   Albumin 3.5 -  5.0 g/dL 3.2  3.9  3.3   AST 15 - 41 U/L 51  61  33   ALT 0 - 44 U/L 36  40  27   Alk Phosphatase 38 - 126 U/L 115  181  89   Total Bilirubin 0.3 - 1.2 mg/dL 1.1  1.2  1.2   Bilirubin, Direct 0.0 - 0.2 mg/dL   0.2     Lab Results  Component Value Date/Time   TSH 1.660 07/21/2022 10:37 AM   TSH 1.049 03/18/2022 05:58 AM   TSH 1.100 03/27/2020 11:16 AM   FREET4 1.03 07/21/2022 10:37 AM   FREET4 1.24 (H) 03/18/2022 05:58 AM       Latest Ref Rng & Units 07/21/2022   10:37 AM 06/11/2022   11:07 AM 05/15/2022    2:03 PM  CBC  WBC 4.0 - 10.5 K/uL 5.0  5.9  5.2   Hemoglobin 13.0 - 17.0 g/dL 13.1  11.5  10.5   Hematocrit 39.0 - 52.0 % 38.9  36.0  33.0   Platelets 150 - 400 K/uL PLATELET CLUMPS NOTED ON SMEAR, UNABLE TO ESTIMATE  111  148     Lab Results   Component Value Date/Time   VITAMINB12 812 05/14/2022 09:18 AM   VITAMINB12 606 03/13/2022 07:56 AM    Clinical ASCVD: Yes  The ASCVD Risk score (Arnett DK, et al., 2019) failed to calculate for the following reasons:   The patient has a prior MI or stroke diagnosis    Other: (CHADS2VASc if Afib, MMRC or CAT for COPD, ACT, DEXA)     04/14/2022   10:06 AM 10/23/2021   11:56 AM 07/09/2021   10:05 AM  Depression screen PHQ 2/9  Decreased Interest 0 0 0  Down, Depressed, Hopeless 0 1 0  PHQ - 2 Score 0 1 0  Altered sleeping 0 1   Tired, decreased energy 0 1   Change in appetite 0 1   Feeling bad or failure about yourself  0 1   Trouble concentrating 0 0   Moving slowly or fidgety/restless 0 0   Suicidal thoughts 0 0   PHQ-9 Score 0 5   Difficult doing work/chores Not difficult at all Not difficult at all      Social History   Tobacco Use  Smoking Status Former   Types: Cigarettes   Quit date: 1970   Years since quitting: 54.2  Smokeless Tobacco Never   BP Readings from Last 3 Encounters:  07/21/22 112/70  06/19/22 95/71  06/11/22 (!) 140/90   Pulse Readings from Last 3 Encounters:  07/21/22 65  06/19/22 (!) 58  06/11/22 74   Wt Readings from Last 3 Encounters:  07/21/22 187 lb 6.4 oz (85 kg)  06/19/22 205 lb 0.4 oz (93 kg)  06/11/22 205 lb (93 kg)   BMI Readings from Last 3 Encounters:  07/21/22 24.06 kg/m  06/19/22 26.32 kg/m  06/11/22 26.32 kg/m    No Known Allergies  Medications Reviewed Today     Reviewed by Stanford Scotland, RN (Registered Nurse) on 07/21/22 at [phone]  Med List Status: <None>   Medication Order Taking? Sig Documenting Provider Last Dose Status Informant  albuterol (PROVENTIL) (2.5 MG/3ML) 0.083% nebulizer solution [id number] Yes Inhale 3 mLs into the lungs every 6 (six) hours as needed for wheezing or shortness of breath. Rip Harbour, NP Taking Active Family Member  albuterol (VENTOLIN HFA) 108 (90 Base) MCG/ACT inhaler  [id number] Yes Inhale 2 puffs into the  lungs every 6 (six) hours as needed for wheezing or shortness of breath. Rip Harbour, NP Taking Active Family Member  amiodarone (PACERONE) 200 MG tablet [id number] Yes Take 0.5 tablets (100 mg total) by mouth daily. Bensimhon, Shaune Pascal, MD Taking Active Family Member  aspirin EC 81 MG tablet [id number] Yes Take 1 tablet (81 mg total) by mouth daily. Swallow whole. [place], Maricela Bo, FNP Taking Active Family Member  atorvastatin (LIPITOR) 80 MG tablet [id number] Yes Take 1 tablet (80 mg total) by mouth daily. Bensimhon, Shaune Pascal, MD Taking Active Family Member  cyanocobalamin (VITAMIN B12) 1000 MCG/ML injection [id number] Yes Inject 1 mL (1,000 mcg total) into the muscle every 30 (thirty) days. Cox, Kirsten, MD Taking Active   dapagliflozin propanediol (FARXIGA) 10 MG TABS tablet [id number] Yes Take 1 tablet (10 mg total) by mouth daily. Bensimhon, Shaune Pascal, MD Taking Active Family Member  digoxin (LANOXIN) 0.125 MG tablet [id number] Yes Take 0.0625 mg by mouth daily. [provider] Taking Active   escitalopram (LEXAPRO) 10 MG tablet [id number] Yes Take 1 tablet (10 mg total) by mouth daily. Rip Harbour, NP Taking Active   esomeprazole (NEXIUM) 40 MG capsule [id number] Yes Take 40 mg by mouth daily. [provider] Taking Active Family Member  ezetimibe (ZETIA) 10 MG tablet [id number] Yes Take 0.5 tablets (5 mg total) by mouth daily. Rip Harbour, NP Taking Active   Fluticasone-Umeclidin-Vilant (TRELEGY ELLIPTA) 100-62.5-25 MCG/ACT AEPB [id number] Yes Inhale 1 puff into the lungs daily. Rip Harbour, NP Taking Active Family Member  gabapentin (NEURONTIN) 600 MG tablet [id number] Yes Take 1 tablet (600 mg total) by mouth at bedtime. Rip Harbour, NP Taking Active Family Member  Iron, Ferrous Sulfate, 325 (65 Fe) MG TABS [id number] Yes Take 325 mg by mouth daily. Rip Harbour, NP Taking Active Family Member  latanoprost  (XALATAN) 0.005 % ophthalmic solution [id number] Yes Place 1 drop into both eyes at bedtime. [provider] Taking Active Family Member  Melatonin 3 MG CAPS [id number] Yes Take 3 mg by mouth at bedtime. [provider] Taking Active Family Member  midodrine (PROAMATINE) 10 MG tablet [id number] Yes Take 1 tablet (10 mg total) by mouth 3 (three) times daily. Rip Harbour, NP Taking Active Family Member  nitroGLYCERIN (NITROSTAT) 0.4 MG SL tablet [id number] Yes Place 1 tablet (0.4 mg total) under the tongue every 5 (five) minutes as needed for chest pain. Rafael Bihari, FNP Taking Active Family Member  OVER THE COUNTER MEDICATION [id number] Yes Apply 1 application  topically at bedtime. Medication: Mag Maxx Cream Back and restless leg [provider] Taking Active Family Member  potassium chloride SA (KLOR-CON M) 20 MEQ tablet [id number] Yes TAKE TWO TABLETS BY MOUTH ONCE DAILY Bensimhon, Shaune Pascal, MD Taking Active   spironolactone (ALDACTONE) 25 MG tablet [id number] Yes Take 1 tablet (25 mg total) by mouth daily. Bensimhon, Shaune Pascal, MD Taking Active Family Member  torsemide (DEMADEX) 20 MG tablet [id number] Yes TAKE THREE TABLETS BY MOUTH ONCE DAILY Bensimhon, Shaune Pascal, MD Taking Active   warfarin (COUMADIN) 1 MG tablet [id number] Yes Take 2 tablets to 3 tablets by mouth daily as directed by [facility] Bensimhon, Shaune Pascal, MD Taking Active Family Member           Med Note Cyndra Numbers Jul 21, 2022  9:54 AM)              SDOH:  (Social Determinants of Health)  assessments and interventions performed: Yes SDOH Interventions    Flowsheet Row Care Coordination from 08/28/2022 in [place] Coordination from 07/04/2022 in [place] Coordination from 06/20/2022 in [place] Management from 05/22/2022 in [place] Management from  04/24/2022 in [place] Telephone from 04/18/2022 in [place] Interventions -- Other (Comment)  [Moms Meals, referral to meals on wheels] -- -- -- Other (Comment)  [Mailed food pantry list]  Housing Interventions -- Other (Comment)  [Bathroom modifications and floor repairs needed - referred to [facility] in Nov 2023, vm left requesting status of referral] -- -- -- --  Transportation Interventions Other (Comment)  [Meds delivered and SW involved] -- Other (Comment)  [Meds delivered] Other (Comment)  [Pharmacy delivering meds now] Intervention Not Indicated --  Utilities Interventions -- -- -- -- -- Other (Comment)  [Mailed utility assistance resources]  Financial Strain Interventions Other (Comment)  [PAP and LIS completed] Other (Comment)  [Request assistance with [employer] Fund] Other (Comment)  [PAP] Other (Comment)  [PAP's] Other (Comment)  [PAP] --      SDOH Screenings   Food Insecurity: Food Insecurity Present (07/04/2022)  Housing: Low Risk  (07/04/2022)  Transportation Needs: Unmet Transportation Needs (08/28/2022)  Utilities: Not At Risk (04/18/2022)  Alcohol Screen: Low Risk  (04/17/2022)  Depression (PHQ2-9): Low Risk  (04/14/2022)  Financial Resource Strain: High Risk (08/28/2022)  Physical Activity: Sufficiently Active (04/14/2022)  Social Connections: Moderately Integrated (04/17/2022)  Stress: No Stress Concern Present (04/14/2022)  Tobacco Use: Medium Risk (07/21/2022)    Medication Assistance:  See CP   Name and location of Current pharmacy:  [place], [place] [address] [place] [address] Phone: [phone] Fax: [phone]  [facility] - [place], [place] - [address] [place] [address] Phone: [phone] Fax:  [phone]    Compliance/Adherence/Medication fill history: Care Gaps: Last Annual Wellness visit? None Last Colonoscopy? None Last Mammogram? None Last Bone Denisty? None AWV never done Shingrix overdue Colonoscopy Covid booster overdue   Last eye exam / retinopathy screening? [facility] this year Last diabetic foot exam? None   Assessment/Plan   CAD: (LDL goal < 70) - LHC 05/23: 100% m RCA and 90% m LAD.  - Echo (5/23) EF 25%, RV moderately reduced, moderate to severe MR - s/p CABG x 2 (LIMA-LAD, SVG-[place]amus INT)  (11/01/21) The ASCVD Risk score (Arnett DK, et al., 2019) failed to calculate for the following reasons:   The patient has a prior MI or stroke diagnosis Lab Results  Component Value Date   CHOL 75 03/18/2022   CHOL 131 01/06/2022   CHOL 194 07/09/2021   Lab Results  Component Value Date   HDL 33 (L) 03/18/2022   HDL 36 (L) 01/06/2022   HDL 55 07/09/2021   Lab Results  Component Value Date   LDLCALC 29 03/18/2022   LDLCALC 79 01/06/2022   LDLCALC 129 (H) 07/09/2021   Lab Results  Component Value Date   TRIG 63 03/18/2022   TRIG 79 01/06/2022   TRIG 55 07/09/2021   Lab Results  Component Value Date   CHOLHDL 2.3 03/18/2022   CHOLHDL 3.6 01/06/2022   CHOLHDL 3.5 07/09/2021   No results found  for: "LDLDIRECT" Last vitamin D No results found for: "25OHVITD2", "25OHVITD3", "VD25OH" Lab Results  Component Value Date   TSH 1.660 07/21/2022   -Controlled -Current treatment: Atorvastatin 80mg  Appropriate, Effective, Safe, Accessible ASA 81mg  Appropriate, Effective, Safe, Accessible Ezetimibe 10mg  Appropriate, Effective, Safe, Accessible -Medications previously tried: N/A  -Current dietary patterns: "Tries to eat healthy" -Current exercise habits: N/A -Educated on Cholesterol goals;  -Recommended to continue current medication   Pre-Diabetes (A1c goal <6.5%) Lab Results  Component Value Date   HGBA1C 6.0 (H) 03/18/2022   HGBA1C  6.0 (H) 11/01/2021   HGBA1C 5.8 (H) 07/09/2021   Lab Results  Component Value Date   LDLCALC 29 03/18/2022   CREATININE 2.28 (H) 08/08/2022    Lab Results  Component Value Date   NA 131 (L) 08/08/2022   K 4.2 08/08/2022   CREATININE 2.28 (H) 08/08/2022   EGFR 36 (L) 06/11/2022   GFRNONAA 29 (L) 08/08/2022   GLUCOSE 93 08/08/2022    Lab Results  Component Value Date   WBC 5.0 07/21/2022   HGB 13.1 07/21/2022   HCT 38.9 (L) 07/21/2022   MCV 82.8 07/21/2022   PLT PLATELET CLUMPS NOTED ON SMEAR, UNABLE TO ESTIMATE 07/21/2022    No results found for: "LABMICR", "MICROALBUR" -Controlled -Current medications: Dapagliflozin Appropriate, Effective, Safe, Accessible PAP Approved 2024 -Medications previously tried: N/A  -Current home glucose readings fasting glucose:  March 2024: 130-140's -Denies hypoglycemic/hyperglycemic symptoms -Educated on A1c and blood sugar goals; -Counseled to check feet daily and get yearly eye exams -November 2023: Start PAP for Dapagliflozin December 2023: Dapagliflozin PAP submitted at beginning of month. Sent msg to [facility] via teams to call company to check on status March 2024: Patient's wife, who manages sugars, was very sick and in the ER last night. When I asked for sugars/BP, they gave me a range. Will have Malecca call in 7 days to get sugars/BP so as to not make [name] have to get up. Goal sugars is <140.   Atrial Fibrillation (Goal: prevent stroke and major bleeding) BP Readings from Last 3 Encounters:  07/21/22 112/70  06/19/22 95/71  06/11/22 (!) 140/90    Pulse Readings from Last 3 Encounters:  07/21/22 65  06/19/22 (!) 58  06/11/22 74   Lab Results  Component Value Date   DIGOXIN 0.8 05/15/2022  -Controlled -CHADSVASC: 6 -Current treatment: Rhythm control:  Amiodarone 100mg  QD Appropriate, Effective, Safe, Accessible Digoxin 0.0625mg  Appropriate, Effective, Safe, Accessible Anticoagulation:  Warfarin UD Appropriate,  Effective, Safe, Accessible -Medications previously tried: N/A -Home BP and HR readings: Doesn't test  -Counseled on increased risk of stroke due to Afib and benefits of anticoagulation for stroke prevention; November 2023: Will have CCM team look into potential of a DOAC and see if patient has spent 3% on meds. If so, will contact Cardio Jan 2024: Patient still hasn't brought in cost statement for DOAC. Sent msg to my team to call Pharmacy ourselves March 2024: LIS was completed last month for patient. Coordinated with Kayla at [facility] to see if it went through. Doesn't look like it yet but it normally takes 30 days and we're around 2 weeks. Will run another script at f/u. Goal is to get patient on DOAC's if LIS approved -Patient's wife, who manages sugars, was very sick and in the ER last night. When I asked for sugars/BP, they gave me a range. Will have Malecca call in 7 days to get sugars/BP so as to not make [name] have to get up. Goal  Systolic [id number]   Heart Failure (Goal: manage symptoms and prevent exacerbations) Lab Results  Component Value Date   DIGOXIN 0.8 05/15/2022   Lab Results  Component Value Date   CREATININE 2.28 (H) 08/08/2022   BUN 39 (H) 08/08/2022   NA 131 (L) 08/08/2022   K 4.2 08/08/2022   CL 93 (L) 08/08/2022   CO2 27 08/08/2022   -Controlled -Last ejection fraction:  EF 25-30 % per ECHO, attends HF clinic s/p late presentation inferior STEMI 05/23 Echo (5/23): EF 25-30%, RV moderately reduced, moderate to severe MR. 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 Echo 03/12/22 showed EF 20-25%,  -HF type: HFrEF (EF < 40%) -NYHA Class: IV (significant limitation of activity / dyspnea at rest) -AHA HF Stage: D (Advanced disease requiring aggressive medical therapy) -Current treatment: Dapagliflozin 10mg  Appropriate, Effective, Safe, Accessible PAP  Approved 2024 Digoxin 0.625mg  Appropriate, Effective, Safe, Accessible [place]rodine 10mg  TID Appropriate, Effective, Safe, Accessible KCl Appropriate, Effective, Safe, Accessible Spironolactone 25mg  Appropriate, Effective, Safe, Accessible Torsemide 80mg  Appropriate, Effective, Safe, Accessible -Medications previously tried: N/A  -Current home BP/HR readings: March [id number]: [id number] systolic is normal for patient -Current home weight readings: Wt Readings from Last 3 Encounters:  07/21/22 187 lb 6.4 oz (85 kg)  06/19/22 205 lb 0.4 oz (93 kg)  06/11/22 205 lb (93 kg)  March 2024: 170-171 pounds per patient States his legs are looking healthy -Current dietary habits: "Tries to eat healthy" -Current exercise habits: N/A -Educated on Benefits of medications for managing symptoms and prolonging life November 2023: Star Farxiga PAP. Susan Moore, spoke with Sonia Side, he's going to credit patient $40 (They accidentally messed up charging for patient. Unsure of details). Coordinated with Charlann Boxer is going to work on a grant for Spironolactone and Digoxin via direct message. Will get a month's worth of samples to last December 2023: [place] the following direct msg, "Howdy! I believe you and I spoke on 04/24/22 about this patient and a grant. I didn't see a note and wanted to check on the status of things. Thank you and I hope you're having a wonderful day!" -Asked CCM team about [place] and ability to use  March 2024: PAP's are approved and patient's BP is running good. He does state he gets a little dizzy in the morning but it goes away after 5 minutes. He thinks it's due to Gabapentin. Will let PCP know -Patient states his weight past week is 170-171 lbs but weight was 187 in office, will send msg to Cardio about concerns for over-diuresis or losing too much weight for alternative reason   COPD (Goal: control symptoms and prevent exacerbations) -Controlled      No data to display         Tobacco Use: Medium Risk (07/21/2022)   Patient History    Smoking Tobacco Use: Former    Smokeless Tobacco Use: Never    Passive Exposure: Not on file   -Current treatment  Albuterol Appropriate, Effective, Safe, Accessible Trelegy Appropriate, Effective, Safe, Accessible 2023: Approved 2024: Approved -Medications previously tried: N/A  -Gold Grade: Unknown -Current COPD Classification:  Unknown -MMRC/CAT score:      No data to display         -Patient denies consistent use of maintenance inhaler -Counseled on [place] inhaler technique; November 2023: Stared PAP for Trelegy December 2023: Dapagliflozin PAP submitted at beginning of  month. Sent msg to [facility] via teams to call company to check on status Jan 2024: Will have team call company to check on status March 2024: Will have Malecca reach out for CAT score next week when she calls for BP/Sugars. Unable to complete CAT score in the past since I normally deal with patient's wife  CPP F/U April 2024  [place] Con[place], [place].D. - [phone]

## 2022-08-28 NOTE — Progress Notes (Cosign Needed)
08-28-2022: Received message form Rashonda at [facility] that patient needs new prescription for warfarin. Left VM with Dr. Haroldine Laws nurse for request.  Jeannette How CMA Clinical Pharmacist Assistant [phone]

## 2022-08-28 NOTE — Telephone Encounter (Signed)
Prescription refill request received for warfarin Lov: 07/21/22 ([facility])  Next INR check: 09/04/22 Warfarin tablet strength: 3mg   Appropriate dose. Refill sent.

## 2022-08-28 NOTE — Addendum Note (Signed)
Addended by: Leonidas Romberg on: 08/28/2022 10:22 AM   Modules accepted: Orders

## 2022-08-28 NOTE — Telephone Encounter (Signed)
Pharmacy called and stated they need anew prescription sent to them for upcoming delivery of his Coumadin.

## 2022-09-03 ENCOUNTER — Telehealth: Payer: Medicare HMO

## 2022-09-03 ENCOUNTER — Ambulatory Visit (INDEPENDENT_AMBULATORY_CARE_PROVIDER_SITE_OTHER): Payer: Medicare HMO

## 2022-09-03 DIAGNOSIS — I5043 Acute on chronic combined systolic (congestive) and diastolic (congestive) heart failure: Secondary | ICD-10-CM

## 2022-09-03 DIAGNOSIS — I10 Essential (primary) hypertension: Secondary | ICD-10-CM

## 2022-09-03 DIAGNOSIS — N1832 Chronic kidney disease, stage 3b: Secondary | ICD-10-CM

## 2022-09-03 NOTE — Patient Instructions (Signed)
Please call the care guide team at [phone] if you need to cancel or reschedule your appointment.   If you are experiencing a Mental Health or [place] or need someone to talk to, please call the Suicide and Crisis Lifeline: 988 call the [place] National Suicide Prevention Lifeline: (516)098-3164 or TTY: (636) 695-7553 TTY (213) 392-7553) to talk to a trained counselor call 1-800-273-TALK (toll free, 24 hour hotline) go to [facility] [place] [phone])   Following is a copy of the CCM Program Consent:  CCM service includes personalized support from designated clinical staff supervised by the physician, including individualized plan of care and coordination with other care providers 24/7 contact phone numbers for assistance for urgent and routine care needs. Service will only be billed when office clinical staff spend 20 minutes or more in a month to coordinate care. Only one practitioner may furnish and bill the service in a calendar month. The patient may stop CCM services at amy time (effective at the end of the month) by phone call to the office staff. The patient will be responsible for cost sharing (co-pay) or up to 20% of the service fee (after annual deductible is met)  Following is a copy of your full provider care plan:   Goals Addressed             This Visit's Progress    CCM (KIDNEY FAILURE)  EXPECTED OUTCOME:  MONITOR, SELF-MANAGE AND REDUCE SYMPTOMS OF KIDNEY FAILURE       Current Barriers:  Knowledge Deficits related to the importance of normalized blood pressures, compliance with medications, and monitoring for changes in the patients urinary and kidney function Care Coordination needs related to financial constraints and the need for assistance with basic needs for health and well being  in a patient with CKD Chronic Disease Management support and education needs related to effective management of  CKD Lacks caregiver support.  Film/video editor.   Planned Interventions: Assessed the patients wife, [name]  understanding of chronic kidney disease. The patient states he is doing very well right now. Denies any acute changes in his kidney function    Evaluation of current treatment plan related to chronic kidney disease self management and patient's adherence to plan as established by provider. The patient is compliant with the plan of care. Sees providers on a regular basis.  Next appointment is 09-12-2022 with the pcp Provided education to patient re: stroke prevention, s/s of heart attack and stroke    Reviewed prescribed diet heart healthy. The patient is compliant with dietary restrictions. States that he has a good appetite. Is eating a product called "luna life" a broth that has 10 grams of protein in it. Education on monitoring sodium content of foods like broth. The patient has had an improvement in his appetite and eating habits since having his procedure recently. States he can swallow a lot better now. Reviewed medications with patient and discussed importance of compliance. The patient is compliant with medications and works with the pharm D on a regular basis for help with medications cost.     Advised patient, providing education and rationale, to monitor blood pressure daily and record, calling PCP for findings outside established parameters    Discussed complications of poorly controlled blood pressure such as heart disease, stroke, circulatory complications, vision complications, kidney impairment, sexual dysfunction    Reviewed scheduled/upcoming provider appointments including: 09-12-2022 at 1020 am.    Advised patient to discuss changes in  kidney function with provider    Discussed plans with patient for ongoing care management follow up and provided patient with direct contact information for care management team    Screening for signs and symptoms of depression related to  chronic disease state      Discussed the impact of chronic kidney disease on daily life and mental health and acknowledged and normalized feelings of disempowerment, fear, and frustration    Assessed social determinant of health barriers    Provided education on kidney disease progression    Engage patient in early, proactive and ongoing discussion about goals of care and what matters most to them    Support coping and stress management by recognizing current strategies and assist in developing new strategies such as mindfulness, journaling, relaxation techniques, problem-solving     Symptom Management: Take medications as prescribed   Attend all scheduled provider appointments Call provider office for new concerns or questions  call the Suicide and Crisis Lifeline: 988 call the [place] National Suicide Prevention Lifeline: (732)246-1717 or TTY: (814) 118-7316 TTY 912-203-5864) to talk to a trained counselor call 1-800-273-TALK (toll free, 24 hour hotline) if experiencing a Mental Health or [place]   Follow Up Plan: Telephone follow up appointment with care management team member scheduled for: 11-12-2022 at 1145 am       CCM Expected Outcome:  Monitor, Self-Manage and Reduce Symptoms of Heart Failure       Current Barriers:  Knowledge Deficits related to the importance of monitoring for sx and sx of heart failure and to prevent exacerbations Care Coordination needs related to resources needed to maintain health and well being  in a patient with CHF and other chronic conditions Chronic Disease Management support and education needs related to CHF Lacks caregiver support.  Film/video editor.   Planned Interventions: Basic overview and discussion of pathophysiology of Heart Failure reviewed. The patient denies any issues with swelling or edema. The patients wife states they have to get a new set of scales. The ones they have are >57 years old and they are off and she cannot  get them calibrated. She says his weight is 172 but they are weighing him 10 pounds heavier.  Provided education on low sodium diet. Review and education provided about watching hidden sodium in foods. Education on reading labels. Is getting MOW and is thankful for this. Education and support given. Reviewed Heart Failure Action Plan in depth and provided written copy Assessed need for readable accurate scales in home. The wife is looking into getting a new scales because the current scales they have is not accurate.  Provided education about placing scale on hard, flat surface Advised patient to weigh each morning after emptying bladder.  Discussed importance of daily weight and advised patient to weigh and record daily. The patients wife states he is at his dry weight of 172.  Reviewed role of diuretics in prevention of fluid overload and management of heart failure. The patient is compliant with his medications Discussed the importance of keeping all appointments with provider. Next follow up with the pcp 09-12-2022 at 1020 am New referral made to community resources care guide team for assistance with resources in his area to help with healthy food options, and help with financial burdens. The patient continues to work with the patient and his wife for resources in the area to help with expressed needs.   Advised patient to discuss changes in edema, swelling, or other questions and concerns related to Heart  Health with provider Screening for signs and symptoms of depression related to chronic disease state  Assessed social determinant of health barriers The patient is sleeping better at night. He states last night was not a good night but that he is sleeping much better and feeling much better.   Symptom Management: Take medications as prescribed   Attend all scheduled provider appointments Call provider office for new concerns or questions  Work with the social worker to address care  coordination needs and will continue to work with the clinical team to address health care and disease management related needs call the Suicide and Crisis Lifeline: 988 call the [place] National Suicide Prevention Lifeline: 260-697-8175 or TTY: (704) 488-9320 TTY 762-616-1666) to talk to a trained counselor call 1-800-273-TALK (toll free, 24 hour hotline) if experiencing a Mental Health or [place]  call office if I gain more than 2 pounds in one day or 5 pounds in one week keep legs up while sitting track weight in diary use salt in moderation watch for swelling in feet, ankles and legs every day weigh myself daily begin a heart failure diary bring diary to all appointments develop a rescue plan follow rescue plan if symptoms flare-up track symptoms and what helps feel better or worse dress right for the weather, hot or cold  Follow Up Plan: Telephone follow up appointment with care management team member scheduled for: 11-12-2022 at 1145 am       CCM Expected Outcome:  Monitor, Self-Manage, and Reduce Symptoms of Hypertension       Current Barriers:  Knowledge Deficits related to the importance of normalized blood pressures and monitoring for changes in blood pressures in a patient with HTN and also hypotension at times  Care Coordination needs related to financial needs and resources  in a patient with HTN and other chronic conditions Chronic Disease Management support and education needs related to effective management of HTN Lacks caregiver support.  Film/video editor.   BP Readings from Last 3 Encounters:  07/21/22 112/70  06/19/22 95/71  06/11/22 (!) 140/90   Self reported blood pressure this am was 110/60 Planned Interventions: Evaluation of current treatment plan related to hypertension self management and patient's adherence to plan as established by provider. The patients blood pressures have been good. Review of changing position slowly to prevent  orthostatic hypotension. Review of safety and falls prevention. The patient denies any issues with dizziness or light headedness recently;   Provided education to patient re: stroke prevention, s/s of heart attack and stroke; Reviewed prescribed diet heart healthy. The patient is eating better since having his procedure and he can swallow better, education and support given. Reviewed medications with patient and discussed importance of compliance. States compliance with medications;  Discussed plans with patient for ongoing care management follow up and provided patient with direct contact information for care management team; Advised patient, providing education and rationale, to monitor blood pressure daily and record, calling PCP for findings outside established parameters. The patient and wife state the patient are doing very well currently. Review of monitoring for falls and safety concerns and light headedness or dizziness. The patient has a history of frequent falls Reviewed scheduled/upcoming provider appointments including: 09-12-2022 at 1020 am with pcp Advised patient to discuss changes in HTN or heart health with provider; Provided education on prescribed diet hear healthy diet,The patient is getting MOW and other meals that are helping out a lot. The SW is working with the patient and his wife  and got a grant to help with electricity/heating bill. She is also working with them on getting repairs to their bathroom. Reflective listening as the patients wife has shingles now and she is not feeling well. Education on monitoring for changes and the contagious stage of shingles so the patient will be protected.  Discussed complications of poorly controlled blood pressure such as heart disease, stroke, circulatory complications, vision complications, kidney impairment, sexual dysfunction;  Screening for signs and symptoms of depression related to chronic disease state;  Assessed social determinant of  health barriers;   Symptom Management: Take medications as prescribed   Attend all scheduled provider appointments Call provider office for new concerns or questions  Work with the social worker to address care coordination needs and will continue to work with the clinical team to address health care and disease management related needs call the Suicide and Crisis Lifeline: 988 call the [place] National Suicide Prevention Lifeline: 838-368-2254 or TTY: 403 144 4137 TTY (205) 492-1643) to talk to a trained counselor call 1-800-273-TALK (toll free, 24 hour hotline) if experiencing a Mental Health or [place]  check blood pressure 3 times per week write blood pressure results in a log or diary learn about high blood pressure keep a blood pressure log take blood pressure log to all doctor appointments call doctor for signs and symptoms of high blood pressure develop an action plan for high blood pressure keep all doctor appointments take medications for blood pressure exactly as prescribed report new symptoms to your doctor  Follow Up Plan: Telephone follow up appointment with care management team member scheduled for: 11-12-2022 at 1145 am          Patient verbalizes understanding of instructions and care plan provided today and agrees to view in [place]. Active MyChart status and patient understanding of how to access instructions and care plan via MyChart confirmed with patient.     Telephone follow up appointment with care management team member scheduled for: 11-12-2022 at 1145 am

## 2022-09-03 NOTE — Chronic Care Management (AMB) (Signed)
Chronic Care Management   CCM RN Visit Note  09/03/2022 Name: Keith Jenkins MRN: [id number] DOB: Jun 06, 1946  Subjective: Keith Jenkins is a 77 y.o. year old male who is a primary care patient of Cox, Kirsten, MD. The patient was referred to the Chronic Care Management team for assistance with care management needs subsequent to provider initiation of CCM services and plan of care.    Today's Visit:  Engaged with patient by telephone for follow up visit.        Goals Addressed             This Visit's Progress    CCM (KIDNEY FAILURE)  EXPECTED OUTCOME:  MONITOR, SELF-MANAGE AND REDUCE SYMPTOMS OF KIDNEY FAILURE       Current Barriers:  Knowledge Deficits related to the importance of normalized blood pressures, compliance with medications, and monitoring for changes in the patients urinary and kidney function Care Coordination needs related to financial constraints and the need for assistance with basic needs for health and well being  in a patient with CKD Chronic Disease Management support and education needs related to effective management of CKD Lacks caregiver support.  Film/video editor.   Planned Interventions: Assessed the patients wife, Keith Jenkins  understanding of chronic kidney disease. The patient states he is doing very well right now. Denies any acute changes in his kidney function    Evaluation of current treatment plan related to chronic kidney disease self management and patient's adherence to plan as established by provider. The patient is compliant with the plan of care. Sees providers on a regular basis.  Next appointment is 09-12-2022 with the pcp Provided education to patient re: stroke prevention, s/s of heart attack and stroke    Reviewed prescribed diet heart healthy. The patient is compliant with dietary restrictions. States that he has a good appetite. Is eating a product called "luna life" a broth that has 10 grams of protein in it. Education on monitoring sodium  content of foods like broth. The patient has had an improvement in his appetite and eating habits since having his procedure recently. States he can swallow a lot better now. Reviewed medications with patient and discussed importance of compliance. The patient is compliant with medications and works with the pharm D on a regular basis for help with medications cost.     Advised patient, providing education and rationale, to monitor blood pressure daily and record, calling PCP for findings outside established parameters    Discussed complications of poorly controlled blood pressure such as heart disease, stroke, circulatory complications, vision complications, kidney impairment, sexual dysfunction    Reviewed scheduled/upcoming provider appointments including: 09-12-2022 at 92 am.    Advised patient to discuss changes in kidney function with provider    Discussed plans with patient for ongoing care management follow up and provided patient with direct contact information for care management team    Screening for signs and symptoms of depression related to chronic disease state      Discussed the impact of chronic kidney disease on daily life and mental health and acknowledged and normalized feelings of disempowerment, fear, and frustration    Assessed social determinant of health barriers    Provided education on kidney disease progression    Engage patient in early, proactive and ongoing discussion about goals of care and what matters most to them    Support coping and stress management by recognizing current strategies and assist in developing new strategies such as mindfulness, journaling, relaxation techniques,  problem-solving     Symptom Management: Take medications as prescribed   Attend all scheduled provider appointments Call provider office for new concerns or questions  call the Suicide and Crisis Lifeline: 988 call the [place] National Suicide Prevention Lifeline: 843-619-9174 or TTY:  724-365-8363 TTY 309-160-9289) to talk to a trained counselor call 1-800-273-TALK (toll free, 24 hour hotline) if experiencing a Mental Health or [place]   Follow Up Plan: Telephone follow up appointment with care management team member scheduled for: 11-12-2022 at 1145 am       CCM Expected Outcome:  Monitor, Self-Manage and Reduce Symptoms of Heart Failure       Current Barriers:  Knowledge Deficits related to the importance of monitoring for sx and sx of heart failure and to prevent exacerbations Care Coordination needs related to resources needed to maintain health and well being  in a patient with CHF and other chronic conditions Chronic Disease Management support and education needs related to CHF Lacks caregiver support.  Film/video editor.   Planned Interventions: Basic overview and discussion of pathophysiology of Heart Failure reviewed. The patient denies any issues with swelling or edema. The patients wife states they have to get a new set of scales. The ones they have are >52 years old and they are off and she cannot get them calibrated. She says his weight is 172 but they are weighing him 10 pounds heavier.  Provided education on low sodium diet. Review and education provided about watching hidden sodium in foods. Education on reading labels. Is getting MOW and is thankful for this. Education and support given. Reviewed Heart Failure Action Plan in depth and provided written copy Assessed need for readable accurate scales in home. The wife is looking into getting a new scales because the current scales they have is not accurate.  Provided education about placing scale on hard, flat surface Advised patient to weigh each morning after emptying bladder.  Discussed importance of daily weight and advised patient to weigh and record daily. The patients wife states he is at his dry weight of 172.  Reviewed role of diuretics in prevention of fluid overload and  management of heart failure. The patient is compliant with his medications Discussed the importance of keeping all appointments with provider. Next follow up with the pcp 09-12-2022 at 1020 am New referral made to community resources care guide team for assistance with resources in his area to help with healthy food options, and help with financial burdens. The patient continues to work with the patient and his wife for resources in the area to help with expressed needs.   Advised patient to discuss changes in edema, swelling, or other questions and concerns related to Heart Health with provider Screening for signs and symptoms of depression related to chronic disease state  Assessed social determinant of health barriers The patient is sleeping better at night. He states last night was not a good night but that he is sleeping much better and feeling much better.   Symptom Management: Take medications as prescribed   Attend all scheduled provider appointments Call provider office for new concerns or questions  Work with the social worker to address care coordination needs and will continue to work with the clinical team to address health care and disease management related needs call the Suicide and Crisis Lifeline: 988 call the [place] National Suicide Prevention Lifeline: 251-365-6809 or TTY: 240 112 9862 TTY 229-020-8561) to talk to a trained counselor call 1-800-273-TALK (toll free, 24 hour hotline)  if experiencing a Mental Health or [place]  call office if I gain more than 2 pounds in one day or 5 pounds in one week keep legs up while sitting track weight in diary use salt in moderation watch for swelling in feet, ankles and legs every day weigh myself daily begin a heart failure diary bring diary to all appointments develop a rescue plan follow rescue plan if symptoms flare-up track symptoms and what helps feel better or worse dress right for the weather, hot or  cold  Follow Up Plan: Telephone follow up appointment with care management team member scheduled for: 11-12-2022 at 1145 am       CCM Expected Outcome:  Monitor, Self-Manage, and Reduce Symptoms of Hypertension       Current Barriers:  Knowledge Deficits related to the importance of normalized blood pressures and monitoring for changes in blood pressures in a patient with HTN and also hypotension at times  Care Coordination needs related to financial needs and resources  in a patient with HTN and other chronic conditions Chronic Disease Management support and education needs related to effective management of HTN Lacks caregiver support.  Film/video editor.   BP Readings from Last 3 Encounters:  07/21/22 112/70  06/19/22 95/71  06/11/22 (!) 140/90   Self reported blood pressure this am was 110/60 Planned Interventions: Evaluation of current treatment plan related to hypertension self management and patient's adherence to plan as established by provider. The patients blood pressures have been good. Review of changing position slowly to prevent orthostatic hypotension. Review of safety and falls prevention. The patient denies any issues with dizziness or light headedness recently;   Provided education to patient re: stroke prevention, s/s of heart attack and stroke; Reviewed prescribed diet heart healthy. The patient is eating better since having his procedure and he can swallow better, education and support given. Reviewed medications with patient and discussed importance of compliance. States compliance with medications;  Discussed plans with patient for ongoing care management follow up and provided patient with direct contact information for care management team; Advised patient, providing education and rationale, to monitor blood pressure daily and record, calling PCP for findings outside established parameters. The patient and wife state the patient are doing very well currently. Review  of monitoring for falls and safety concerns and light headedness or dizziness. The patient has a history of frequent falls Reviewed scheduled/upcoming provider appointments including: 09-12-2022 at 1020 am with pcp Advised patient to discuss changes in HTN or heart health with provider; Provided education on prescribed diet hear healthy diet,The patient is getting MOW and other meals that are helping out a lot. The SW is working with the patient and his wife and got a grant to help with Arts administrator. She is also working with them on getting repairs to their bathroom. Reflective listening as the patients wife has shingles now and she is not feeling well. Education on monitoring for changes and the contagious stage of shingles so the patient will be protected.  Discussed complications of poorly controlled blood pressure such as heart disease, stroke, circulatory complications, vision complications, kidney impairment, sexual dysfunction;  Screening for signs and symptoms of depression related to chronic disease state;  Assessed social determinant of health barriers;   Symptom Management: Take medications as prescribed   Attend all scheduled provider appointments Call provider office for new concerns or questions  Work with the social worker to address care coordination needs and will continue to work with  the clinical team to address health care and disease management related needs call the Suicide and Crisis Lifeline: 988 call the [place] National Suicide Prevention Lifeline: 249-724-5484 or TTY: 207-250-9575 TTY (669) 108-2751) to talk to a trained counselor call 1-800-273-TALK (toll free, 24 hour hotline) if experiencing a Mental Health or [place]  check blood pressure 3 times per week write blood pressure results in a log or diary learn about high blood pressure keep a blood pressure log take blood pressure log to all doctor appointments call doctor for signs and  symptoms of high blood pressure develop an action plan for high blood pressure keep all doctor appointments take medications for blood pressure exactly as prescribed report new symptoms to your doctor  Follow Up Plan: Telephone follow up appointment with care management team member scheduled for: 11-12-2022 at 1145 am          Plan:Telephone follow up appointment with care management team member scheduled for:  11-12-2022 at 1145 am  Noreene Larsson RN, MSN, CCM RN Care Manager  Chronic Care Management Direct Number: [phone]

## 2022-09-04 ENCOUNTER — Ambulatory Visit (INDEPENDENT_AMBULATORY_CARE_PROVIDER_SITE_OTHER): Payer: Medicare HMO

## 2022-09-04 DIAGNOSIS — M1990 Unspecified osteoarthritis, unspecified site: Secondary | ICD-10-CM | POA: Diagnosis not present

## 2022-09-04 DIAGNOSIS — K222 Esophageal obstruction: Secondary | ICD-10-CM | POA: Diagnosis not present

## 2022-09-04 DIAGNOSIS — I2510 Atherosclerotic heart disease of native coronary artery without angina pectoris: Secondary | ICD-10-CM | POA: Diagnosis not present

## 2022-09-04 DIAGNOSIS — Z951 Presence of aortocoronary bypass graft: Secondary | ICD-10-CM | POA: Diagnosis not present

## 2022-09-04 DIAGNOSIS — I5023 Acute on chronic systolic (congestive) heart failure: Secondary | ICD-10-CM | POA: Diagnosis not present

## 2022-09-04 DIAGNOSIS — H409 Unspecified glaucoma: Secondary | ICD-10-CM | POA: Diagnosis not present

## 2022-09-04 DIAGNOSIS — Z87891 Personal history of nicotine dependence: Secondary | ICD-10-CM | POA: Diagnosis not present

## 2022-09-04 DIAGNOSIS — J449 Chronic obstructive pulmonary disease, unspecified: Secondary | ICD-10-CM | POA: Diagnosis not present

## 2022-09-04 DIAGNOSIS — Z7982 Long term (current) use of aspirin: Secondary | ICD-10-CM | POA: Diagnosis not present

## 2022-09-04 DIAGNOSIS — E785 Hyperlipidemia, unspecified: Secondary | ICD-10-CM | POA: Diagnosis not present

## 2022-09-04 DIAGNOSIS — I252 Old myocardial infarction: Secondary | ICD-10-CM | POA: Diagnosis not present

## 2022-09-04 DIAGNOSIS — Z9181 History of falling: Secondary | ICD-10-CM | POA: Diagnosis not present

## 2022-09-04 DIAGNOSIS — D509 Iron deficiency anemia, unspecified: Secondary | ICD-10-CM | POA: Diagnosis not present

## 2022-09-04 DIAGNOSIS — I480 Paroxysmal atrial fibrillation: Secondary | ICD-10-CM | POA: Diagnosis not present

## 2022-09-04 DIAGNOSIS — Z5181 Encounter for therapeutic drug level monitoring: Secondary | ICD-10-CM

## 2022-09-04 DIAGNOSIS — Z954 Presence of other heart-valve replacement: Secondary | ICD-10-CM | POA: Diagnosis not present

## 2022-09-04 DIAGNOSIS — R1319 Other dysphagia: Secondary | ICD-10-CM | POA: Diagnosis not present

## 2022-09-04 DIAGNOSIS — I350 Nonrheumatic aortic (valve) stenosis: Secondary | ICD-10-CM | POA: Diagnosis not present

## 2022-09-04 DIAGNOSIS — Z7901 Long term (current) use of anticoagulants: Secondary | ICD-10-CM | POA: Diagnosis not present

## 2022-09-04 DIAGNOSIS — N1832 Chronic kidney disease, stage 3b: Secondary | ICD-10-CM | POA: Diagnosis not present

## 2022-09-04 DIAGNOSIS — K219 Gastro-esophageal reflux disease without esophagitis: Secondary | ICD-10-CM | POA: Diagnosis not present

## 2022-09-04 LAB — POCT INR: INR: 2.5 (ref 2.0–3.0)

## 2022-09-04 NOTE — Patient Instructions (Signed)
Description   Spoke with Colletta [place], [facility] and pt and instructed for pt to continue taking 3 tablets daily. Recheck INR in 3 weeks.  [facility] [phone] Fax Clearance form to [phone]

## 2022-09-07 DIAGNOSIS — I1310 Hypertensive heart and chronic kidney disease without heart failure, with stage 1 through stage 4 chronic kidney disease, or unspecified chronic kidney disease: Secondary | ICD-10-CM

## 2022-09-07 DIAGNOSIS — N183 Chronic kidney disease, stage 3 unspecified: Secondary | ICD-10-CM

## 2022-09-07 DIAGNOSIS — I5040 Unspecified combined systolic (congestive) and diastolic (congestive) heart failure: Secondary | ICD-10-CM | POA: Diagnosis not present

## 2022-09-08 ENCOUNTER — Encounter (HOSPITAL_COMMUNITY): Payer: Self-pay | Admitting: Internal Medicine

## 2022-09-08 NOTE — Patient Outreach (Addendum)
  Care Coordination   Follow Up Visit Note   09/08/2022 Name: Kunio Brackens MRN: [id number] DOB: 11/02/1945  Rockey Entwisle is a 77 y.o. year old male who sees Cox, Kirsten, MD for primary care. I  attempted to contact the patient today to follow up on home modification needs.   Goals Addressed             This Visit's Progress    Care Coordination Activities       Care Coordination Interventions: [facility]ed [facility] to determine the status of patients referral Spoke with Aram Beecham who advised the patients intake was completed on 3/5 by Sue Lush Determined the next step in the referral process if for [facility] to receive patients medical and financial records Attempted to contact the patient to follow up on application process, voice message left requesting a return call         SDOH assessments and interventions completed:  No     Care Coordination Interventions:  Yes, provided   Interventions Today    Flowsheet Row Most Recent Value  Chronic Disease   Chronic disease during today's visit Chronic Kidney Disease/End Stage Renal Disease (ESRD), Congestive Heart Failure (CHF)  General Interventions   General Interventions Discussed/Reviewed Communication with  Communication with --  [[facility]]        Follow up plan:  SW will continue to follow    Encounter Outcome:  Pt. Visit Completed   Mammie Russian, CDP Social Worker, Certified Dementia Practitioner [facility] Care Management  Care Coordination [phone]

## 2022-09-11 DIAGNOSIS — M1990 Unspecified osteoarthritis, unspecified site: Secondary | ICD-10-CM | POA: Diagnosis not present

## 2022-09-11 DIAGNOSIS — I350 Nonrheumatic aortic (valve) stenosis: Secondary | ICD-10-CM | POA: Diagnosis not present

## 2022-09-11 DIAGNOSIS — Z87891 Personal history of nicotine dependence: Secondary | ICD-10-CM | POA: Diagnosis not present

## 2022-09-11 DIAGNOSIS — E785 Hyperlipidemia, unspecified: Secondary | ICD-10-CM | POA: Diagnosis not present

## 2022-09-11 DIAGNOSIS — J449 Chronic obstructive pulmonary disease, unspecified: Secondary | ICD-10-CM | POA: Diagnosis not present

## 2022-09-11 DIAGNOSIS — D509 Iron deficiency anemia, unspecified: Secondary | ICD-10-CM | POA: Diagnosis not present

## 2022-09-11 DIAGNOSIS — I480 Paroxysmal atrial fibrillation: Secondary | ICD-10-CM | POA: Diagnosis not present

## 2022-09-11 DIAGNOSIS — Z7901 Long term (current) use of anticoagulants: Secondary | ICD-10-CM | POA: Diagnosis not present

## 2022-09-11 DIAGNOSIS — Z954 Presence of other heart-valve replacement: Secondary | ICD-10-CM | POA: Diagnosis not present

## 2022-09-11 DIAGNOSIS — N1832 Chronic kidney disease, stage 3b: Secondary | ICD-10-CM | POA: Diagnosis not present

## 2022-09-11 DIAGNOSIS — I252 Old myocardial infarction: Secondary | ICD-10-CM | POA: Diagnosis not present

## 2022-09-11 DIAGNOSIS — Z951 Presence of aortocoronary bypass graft: Secondary | ICD-10-CM | POA: Diagnosis not present

## 2022-09-11 DIAGNOSIS — I2510 Atherosclerotic heart disease of native coronary artery without angina pectoris: Secondary | ICD-10-CM | POA: Diagnosis not present

## 2022-09-11 DIAGNOSIS — I5023 Acute on chronic systolic (congestive) heart failure: Secondary | ICD-10-CM | POA: Diagnosis not present

## 2022-09-11 DIAGNOSIS — R1319 Other dysphagia: Secondary | ICD-10-CM | POA: Diagnosis not present

## 2022-09-11 DIAGNOSIS — H409 Unspecified glaucoma: Secondary | ICD-10-CM | POA: Diagnosis not present

## 2022-09-11 DIAGNOSIS — K222 Esophageal obstruction: Secondary | ICD-10-CM | POA: Diagnosis not present

## 2022-09-11 DIAGNOSIS — Z7982 Long term (current) use of aspirin: Secondary | ICD-10-CM | POA: Diagnosis not present

## 2022-09-11 DIAGNOSIS — Z9181 History of falling: Secondary | ICD-10-CM | POA: Diagnosis not present

## 2022-09-11 DIAGNOSIS — K219 Gastro-esophageal reflux disease without esophagitis: Secondary | ICD-10-CM | POA: Diagnosis not present

## 2022-09-12 ENCOUNTER — Ambulatory Visit (INDEPENDENT_AMBULATORY_CARE_PROVIDER_SITE_OTHER): Payer: Medicare HMO | Admitting: Family Medicine

## 2022-09-12 ENCOUNTER — Telehealth: Payer: Self-pay

## 2022-09-12 VITALS — BP 100/68 | HR 60 | Temp 97.1°F | Resp 16 | Ht 73.0 in | Wt 188.0 lb

## 2022-09-12 DIAGNOSIS — N1832 Chronic kidney disease, stage 3b: Secondary | ICD-10-CM | POA: Diagnosis not present

## 2022-09-12 DIAGNOSIS — I5022 Chronic systolic (congestive) heart failure: Secondary | ICD-10-CM | POA: Insufficient documentation

## 2022-09-12 DIAGNOSIS — I25810 Atherosclerosis of coronary artery bypass graft(s) without angina pectoris: Secondary | ICD-10-CM

## 2022-09-12 DIAGNOSIS — E782 Mixed hyperlipidemia: Secondary | ICD-10-CM

## 2022-09-12 DIAGNOSIS — R7303 Prediabetes: Secondary | ICD-10-CM | POA: Diagnosis not present

## 2022-09-12 DIAGNOSIS — I9589 Other hypotension: Secondary | ICD-10-CM | POA: Diagnosis not present

## 2022-09-12 DIAGNOSIS — E538 Deficiency of other specified B group vitamins: Secondary | ICD-10-CM | POA: Diagnosis not present

## 2022-09-12 DIAGNOSIS — I700 Atherosclerosis of aorta: Secondary | ICD-10-CM

## 2022-09-12 DIAGNOSIS — Z952 Presence of prosthetic heart valve: Secondary | ICD-10-CM

## 2022-09-12 DIAGNOSIS — I480 Paroxysmal atrial fibrillation: Secondary | ICD-10-CM | POA: Diagnosis not present

## 2022-09-12 NOTE — Assessment & Plan Note (Signed)
The current medical regimen is effective;  continue present plan and medications.  Follow up with HF clinic.  Continue digoxin, farxiga, torsemide Check Labs

## 2022-09-12 NOTE — Assessment & Plan Note (Signed)
Hemoglobin A1c 6.0%, 3 month avg of blood sugars, is in prediabetic range.  In order to prevent progression to diabetes, recommend low carb diet and regular exercise 
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
## 2022-09-12 NOTE — Assessment & Plan Note (Signed)
Stable

## 2022-09-12 NOTE — Assessment & Plan Note (Signed)
Well controlled.  No changes to medicines. Atorvastatin 80 mg daily. Continue to work on eating a healthy diet and exercise.  Labs drawn today.  

## 2022-09-12 NOTE — Progress Notes (Signed)
09-12-2022: CAT assessment  How often do I cough: 1 Muss in chest: 1 Tightness in chest: 0 Walking up a hill or flight of stairs am I breathless: 1 Activity limitations: 1 Confident in leaving the house: 1 I sleep soundly: 1 I have lots of energy: 1  Score:7   [facility] CMA Clinical Pharmacist Assistant [phone]

## 2022-09-14 ENCOUNTER — Encounter: Payer: Self-pay | Admitting: Family Medicine

## 2022-09-14 DIAGNOSIS — I959 Hypotension, unspecified: Secondary | ICD-10-CM | POA: Insufficient documentation

## 2022-09-14 DIAGNOSIS — E538 Deficiency of other specified B group vitamins: Secondary | ICD-10-CM | POA: Insufficient documentation

## 2022-09-14 DIAGNOSIS — I480 Paroxysmal atrial fibrillation: Secondary | ICD-10-CM | POA: Insufficient documentation

## 2022-09-14 DIAGNOSIS — I2510 Atherosclerotic heart disease of native coronary artery without angina pectoris: Secondary | ICD-10-CM | POA: Insufficient documentation

## 2022-09-14 NOTE — Assessment & Plan Note (Signed)
Continue aspirin 81 mg daily, zetia 10 mg before bed,  and atorvastatin 80 mg daily.

## 2022-09-14 NOTE — Assessment & Plan Note (Signed)
Continue atorvastatin 80 mg daily, ntg, aspirin 81 mg daily.

## 2022-09-14 NOTE — Assessment & Plan Note (Signed)
Continue b12 shots monthly. °

## 2022-09-14 NOTE — Assessment & Plan Note (Deleted)
Continue coumadin.  

## 2022-09-14 NOTE — Assessment & Plan Note (Signed)
On midodrine 10 mg three times a day.

## 2022-09-14 NOTE — Assessment & Plan Note (Signed)
Continue coumadin. Goes to coumadin clinic.  Continue amiodarone.

## 2022-09-15 ENCOUNTER — Ambulatory Visit: Payer: Self-pay

## 2022-09-15 LAB — HEMOGLOBIN A1C
Est. average glucose Bld gHb Est-mCnc: 134 mg/dL
Hgb A1c MFr Bld: 6.3 % — ABNORMAL HIGH (ref 4.8–5.6)

## 2022-09-15 LAB — COMPREHENSIVE METABOLIC PANEL
ALT: 48 IU/L — ABNORMAL HIGH (ref 0–44)
AST: 59 IU/L — ABNORMAL HIGH (ref 0–40)
Albumin/Globulin Ratio: 1.2 (ref 1.2–2.2)
Albumin: 3.8 g/dL (ref 3.8–4.8)
Alkaline Phosphatase: 188 IU/L — ABNORMAL HIGH (ref 44–121)
BUN/Creatinine Ratio: 18 (ref 10–24)
BUN: 39 mg/dL — ABNORMAL HIGH (ref 8–27)
Bilirubin Total: 1.1 mg/dL (ref 0.0–1.2)
CO2: 24 mmol/L (ref 20–29)
Calcium: 9.2 mg/dL (ref 8.6–10.2)
Chloride: 93 mmol/L — ABNORMAL LOW (ref 96–106)
Creatinine, Ser: 2.17 mg/dL — ABNORMAL HIGH (ref 0.76–1.27)
Globulin, Total: 3.3 g/dL (ref 1.5–4.5)
Glucose: 81 mg/dL (ref 70–99)
Potassium: 3.8 mmol/L (ref 3.5–5.2)
Sodium: 135 mmol/L (ref 134–144)
Total Protein: 7.1 g/dL (ref 6.0–8.5)
eGFR: 31 mL/min/{1.73_m2} — ABNORMAL LOW (ref >59)

## 2022-09-15 LAB — CBC WITH DIFFERENTIAL/PLATELET
Basophils Absolute: 0.1 10*3/uL (ref 0.0–0.2)
Basos: 1 %
EOS (ABSOLUTE): 0.2 10*3/uL (ref 0.0–0.4)
Eos: 4 %
Hematocrit: 41.2 % (ref 37.5–51.0)
Hemoglobin: 13.8 g/dL (ref 13.0–17.7)
Immature Grans (Abs): 0 10*3/uL (ref 0.0–0.1)
Immature Granulocytes: 0 %
Lymphocytes Absolute: 0.8 10*3/uL (ref 0.7–3.1)
Lymphs: 15 %
MCH: 28.6 pg (ref 26.6–33.0)
MCHC: 33.5 g/dL (ref 31.5–35.7)
MCV: 86 fL (ref 79–97)
Monocytes Absolute: 0.8 10*3/uL (ref 0.1–0.9)
Monocytes: 15 %
Neutrophils Absolute: 3.3 10*3/uL (ref 1.4–7.0)
Neutrophils: 65 %
Platelets: 139 10*3/uL — ABNORMAL LOW (ref 150–450)
RBC: 4.82 x10E6/uL (ref 4.14–5.80)
RDW: 18 % — ABNORMAL HIGH (ref 11.6–15.4)
WBC: 5.1 10*3/uL (ref 3.4–10.8)

## 2022-09-15 NOTE — Patient Instructions (Signed)
Visit Information  Thank you for taking time to visit with me today. Please don't hesitate to contact me if I can be of assistance to you.   Following are the goals we discussed today:   Goals Addressed             This Visit's Progress    Care Coordination Activities       Care Coordination Interventions: Advised patients referral to Vocational Rehab is still open with the agency needing to review patients medical records to confirm he is eligible Patients spouse is dealing with her own medical condition and unable to stay on the call today for an extended period Scheduled follow up call later in the month         Our next appointment is by telephone on 4/29 at 1:30  Please call the care guide team at [phone] if you need to cancel or reschedule your appointment.   If you are experiencing a Mental Health or Behavioral Health Crisis or need someone to talk to, please call 911  Patient verbalizes understanding of instructions and care plan provided today and agrees to view in MyChart. Active MyChart status and patient understanding of how to access instructions and care plan via MyChart confirmed with patient.     Bevelyn Ngo, BSW, CDP Social Worker, Certified Dementia Practitioner [facility] Care Management  Care Coordination [phone]

## 2022-09-15 NOTE — Patient Outreach (Signed)
  Care Coordination   Follow Up Visit Note   09/15/2022 Name: Bauer Malinsky MRN: [id number] DOB: Oct 16, 1945  Jubei Herwick is a 77 y.o. year old male who sees Cox, Kirsten, MD for primary care. I  spoke with patients spouse Brondon Braz by phone.  What matters to the patients health and wellness today?  Resource linking    Goals Addressed             This Visit's Progress    Care Coordination Activities       Care Coordination Interventions: Advised patients referral to Vocational Rehab is still open with the agency needing to review patients medical records to confirm he is eligible Patients spouse is dealing with her own medical condition and unable to stay on the call today for an extended period Scheduled follow up call later in the month         SDOH assessments and interventions completed:  No     Care Coordination Interventions:  Yes, provided   Interventions Today    Flowsheet Row Most Recent Value  Chronic Disease   Chronic disease during today's visit Congestive Heart Failure (CHF), Chronic Kidney Disease/End Stage Renal Disease (ESRD)  General Interventions   General Interventions Discussed/Reviewed General Interventions Reviewed, Community Resources        Follow up plan: Follow up call scheduled for 4/29    Encounter Outcome:  Pt. Visit Completed   Bevelyn Ngo, Kenard Gower, CDP Social Worker, Certified Dementia Practitioner [facility] Care Management  Care Coordination [phone]

## 2022-09-17 ENCOUNTER — Other Ambulatory Visit (HOSPITAL_COMMUNITY): Payer: Self-pay | Admitting: Internal Medicine

## 2022-09-18 DIAGNOSIS — D509 Iron deficiency anemia, unspecified: Secondary | ICD-10-CM | POA: Diagnosis not present

## 2022-09-18 DIAGNOSIS — Z954 Presence of other heart-valve replacement: Secondary | ICD-10-CM | POA: Diagnosis not present

## 2022-09-18 DIAGNOSIS — I350 Nonrheumatic aortic (valve) stenosis: Secondary | ICD-10-CM | POA: Diagnosis not present

## 2022-09-18 DIAGNOSIS — Z87891 Personal history of nicotine dependence: Secondary | ICD-10-CM | POA: Diagnosis not present

## 2022-09-18 DIAGNOSIS — R1319 Other dysphagia: Secondary | ICD-10-CM | POA: Diagnosis not present

## 2022-09-18 DIAGNOSIS — M1990 Unspecified osteoarthritis, unspecified site: Secondary | ICD-10-CM | POA: Diagnosis not present

## 2022-09-18 DIAGNOSIS — I480 Paroxysmal atrial fibrillation: Secondary | ICD-10-CM | POA: Diagnosis not present

## 2022-09-18 DIAGNOSIS — I252 Old myocardial infarction: Secondary | ICD-10-CM | POA: Diagnosis not present

## 2022-09-18 DIAGNOSIS — N1832 Chronic kidney disease, stage 3b: Secondary | ICD-10-CM | POA: Diagnosis not present

## 2022-09-18 DIAGNOSIS — J449 Chronic obstructive pulmonary disease, unspecified: Secondary | ICD-10-CM | POA: Diagnosis not present

## 2022-09-18 DIAGNOSIS — Z7982 Long term (current) use of aspirin: Secondary | ICD-10-CM | POA: Diagnosis not present

## 2022-09-18 DIAGNOSIS — Z9181 History of falling: Secondary | ICD-10-CM | POA: Diagnosis not present

## 2022-09-18 DIAGNOSIS — H409 Unspecified glaucoma: Secondary | ICD-10-CM | POA: Diagnosis not present

## 2022-09-18 DIAGNOSIS — K222 Esophageal obstruction: Secondary | ICD-10-CM | POA: Diagnosis not present

## 2022-09-18 DIAGNOSIS — I5023 Acute on chronic systolic (congestive) heart failure: Secondary | ICD-10-CM | POA: Diagnosis not present

## 2022-09-18 DIAGNOSIS — I2510 Atherosclerotic heart disease of native coronary artery without angina pectoris: Secondary | ICD-10-CM | POA: Diagnosis not present

## 2022-09-18 DIAGNOSIS — Z7901 Long term (current) use of anticoagulants: Secondary | ICD-10-CM | POA: Diagnosis not present

## 2022-09-18 DIAGNOSIS — Z951 Presence of aortocoronary bypass graft: Secondary | ICD-10-CM | POA: Diagnosis not present

## 2022-09-18 DIAGNOSIS — K219 Gastro-esophageal reflux disease without esophagitis: Secondary | ICD-10-CM | POA: Diagnosis not present

## 2022-09-18 DIAGNOSIS — E785 Hyperlipidemia, unspecified: Secondary | ICD-10-CM | POA: Diagnosis not present

## 2022-09-22 ENCOUNTER — Telehealth: Payer: Self-pay

## 2022-09-22 NOTE — Progress Notes (Signed)
Care Management & Coordination Services Pharmacy Team  Reason for Encounter: Medication coordination and delivery  Contacted patient to discuss medications and coordinate delivery from [facility]. {US HC Outreach:28874} Cycle dispensing form sent to *** for review.   Last adherence delivery date: 09-02-2022   Patient is due for next adherence delivery on: 10-01-2022  This delivery to include: Vials  30 Days  Amiodarone 100 mg daily Atorvastatin 80 mg at Bedtime Digoxin 0.125 mg half tablet daily Lexapro 10 mg daily Zetia 10 mg half tablet daily Gabapentin 600 mg at Bedtime Potassium chloride 20 meq 2 tablets daily Spironolactone 25 mg daily Warfarin 1 mg 2-3 tabs daily as directed  Torsemide 20 mg 4 tablets daily  Aspirin 81 mg daily   Patient declined the following medications this month: ***  No refill request needed.  {Delivery AOZH:08657}   Any concerns about your medications? {yes/no:20286}  How often do you forget or accidentally miss a dose? {Missed doses:25554}  Do you use a pillbox? {yes/no:20286}  Is patient in packaging {yes/no:20286}  If yes  What is the date on your next pill pack?  Any concerns or issues with your packaging?   Recent blood pressure readings are as follows:***  Recent blood glucose readings are as follows:***   Chart review: Recent office visits:  09-15-2022 Bevelyn Ngo (CC).  09-12-2022 Blane Ohara, MD. RDW= 18.0, platelets= 139. A1C= 6.3. Finished melatonin.  09-03-2022 Marlowe Sax, RN (CCM).  Recent consult visits:  09-04-2022 Memory Dance, RN (Cardiology). Anti-coag visit.  Hospital visits:  None in previous 6 months  Medications: Outpatient Encounter Medications as of 09/22/2022  Medication Sig   albuterol (PROVENTIL) (2.5 MG/3ML) 0.083% nebulizer solution Inhale 3 mLs into the lungs every 6 (six) hours as needed for wheezing or shortness of breath.   albuterol (VENTOLIN HFA) 108 (90 Base)  MCG/ACT inhaler Inhale 2 puffs into the lungs every 6 (six) hours as needed for wheezing or shortness of breath.   amiodarone (PACERONE) 200 MG tablet Take 0.5 tablets (100 mg total) by mouth daily.   aspirin EC 81 MG tablet Take 1 tablet (81 mg total) by mouth daily. Swallow whole.   atorvastatin (LIPITOR) 80 MG tablet TAKE ONE TABLET BY MOUTH EVERYDAY AT BEDTIME   cyanocobalamin (VITAMIN B12) 1000 MCG/ML injection Inject 1 mL (1,000 mcg total) into the muscle every 30 (thirty) days.   dapagliflozin propanediol (FARXIGA) 10 MG TABS tablet Take 1 tablet (10 mg total) by mouth daily.   digoxin (LANOXIN) 0.125 MG tablet TAKE 1/2 TABLET BY MOUTH ONCE DAILY   escitalopram (LEXAPRO) 10 MG tablet Take 1 tablet (10 mg total) by mouth daily.   esomeprazole (NEXIUM) 40 MG capsule Take 40 mg by mouth daily.   ezetimibe (ZETIA) 10 MG tablet Take 0.5 tablets (5 mg total) by mouth daily.   Fluticasone-Umeclidin-Vilant (TRELEGY ELLIPTA) 100-62.5-25 MCG/ACT AEPB Inhale 1 puff into the lungs daily. (Patient not taking: Reported on 09/12/2022)   gabapentin (NEURONTIN) 600 MG tablet Take 1 tablet (600 mg total) by mouth at bedtime.   Iron, Ferrous Sulfate, 325 (65 Fe) MG TABS Take 325 mg by mouth daily.   latanoprost (XALATAN) 0.005 % ophthalmic solution Place 1 drop into both eyes at bedtime.   midodrine (PROAMATINE) 10 MG tablet Take 1 tablet (10 mg total) by mouth 3 (three) times daily.   nitroGLYCERIN (NITROSTAT) 0.4 MG SL tablet Place 1 tablet (0.4 mg total) under the tongue every 5 (five) minutes as needed for chest pain.  OVER THE COUNTER MEDICATION Apply 1 application  topically at bedtime. Medication: Mag Maxx Cream Back and restless leg   potassium chloride SA (KLOR-CON M) 20 MEQ tablet TAKE TWO TABLETS BY MOUTH ONCE DAILY   spironolactone (ALDACTONE) 25 MG tablet Take 1 tablet (25 mg total) by mouth daily.   torsemide (DEMADEX) 20 MG tablet Take 1 tablet (20 mg total) by mouth daily.   warfarin  (COUMADIN) 1 MG tablet Take 2-3 tablets BY MOUTH daily AS DIRECTED by coumadin clinic   No facility-administered encounter medications on file as of 09/22/2022.   BP Readings from Last 3 Encounters:  09/12/22 100/68  07/21/22 112/70  06/19/22 95/71    Pulse Readings from Last 3 Encounters:  09/12/22 60  07/21/22 65  06/19/22 (!) 58    Lab Results  Component Value Date/Time   HGBA1C 6.3 (H) 09/12/2022 11:25 AM   HGBA1C 6.0 (H) 03/18/2022 05:58 AM   Lab Results  Component Value Date   CREATININE 2.17 (H) 09/12/2022   BUN 39 (H) 09/12/2022   GFRNONAA 29 (L) 08/08/2022   GFRAA 65 03/27/2020   NA 135 09/12/2022   K 3.8 09/12/2022   CALCIUM 9.2 09/12/2022   CO2 24 09/12/2022  09-22-2022: wife will like a call back tomorrow morning   Huey Romans [facility] Clinical Pharmacist Assistant [phone]

## 2022-09-25 ENCOUNTER — Ambulatory Visit (INDEPENDENT_AMBULATORY_CARE_PROVIDER_SITE_OTHER): Payer: Medicare HMO | Admitting: Cardiology

## 2022-09-25 DIAGNOSIS — I252 Old myocardial infarction: Secondary | ICD-10-CM | POA: Diagnosis not present

## 2022-09-25 DIAGNOSIS — M1990 Unspecified osteoarthritis, unspecified site: Secondary | ICD-10-CM | POA: Diagnosis not present

## 2022-09-25 DIAGNOSIS — Z7982 Long term (current) use of aspirin: Secondary | ICD-10-CM | POA: Diagnosis not present

## 2022-09-25 DIAGNOSIS — K222 Esophageal obstruction: Secondary | ICD-10-CM | POA: Diagnosis not present

## 2022-09-25 DIAGNOSIS — Z7901 Long term (current) use of anticoagulants: Secondary | ICD-10-CM | POA: Diagnosis not present

## 2022-09-25 DIAGNOSIS — Z5181 Encounter for therapeutic drug level monitoring: Secondary | ICD-10-CM | POA: Diagnosis not present

## 2022-09-25 DIAGNOSIS — I350 Nonrheumatic aortic (valve) stenosis: Secondary | ICD-10-CM | POA: Diagnosis not present

## 2022-09-25 DIAGNOSIS — I5022 Chronic systolic (congestive) heart failure: Secondary | ICD-10-CM | POA: Diagnosis not present

## 2022-09-25 DIAGNOSIS — D509 Iron deficiency anemia, unspecified: Secondary | ICD-10-CM | POA: Diagnosis not present

## 2022-09-25 DIAGNOSIS — J449 Chronic obstructive pulmonary disease, unspecified: Secondary | ICD-10-CM | POA: Diagnosis not present

## 2022-09-25 DIAGNOSIS — R1319 Other dysphagia: Secondary | ICD-10-CM | POA: Diagnosis not present

## 2022-09-25 DIAGNOSIS — I480 Paroxysmal atrial fibrillation: Secondary | ICD-10-CM | POA: Diagnosis not present

## 2022-09-25 DIAGNOSIS — N1832 Chronic kidney disease, stage 3b: Secondary | ICD-10-CM | POA: Diagnosis not present

## 2022-09-25 DIAGNOSIS — E785 Hyperlipidemia, unspecified: Secondary | ICD-10-CM | POA: Diagnosis not present

## 2022-09-25 DIAGNOSIS — Z954 Presence of other heart-valve replacement: Secondary | ICD-10-CM | POA: Diagnosis not present

## 2022-09-25 DIAGNOSIS — Z951 Presence of aortocoronary bypass graft: Secondary | ICD-10-CM | POA: Diagnosis not present

## 2022-09-25 DIAGNOSIS — Z87891 Personal history of nicotine dependence: Secondary | ICD-10-CM | POA: Diagnosis not present

## 2022-09-25 DIAGNOSIS — H409 Unspecified glaucoma: Secondary | ICD-10-CM | POA: Diagnosis not present

## 2022-09-25 DIAGNOSIS — Z9181 History of falling: Secondary | ICD-10-CM | POA: Diagnosis not present

## 2022-09-25 DIAGNOSIS — K219 Gastro-esophageal reflux disease without esophagitis: Secondary | ICD-10-CM | POA: Diagnosis not present

## 2022-09-25 DIAGNOSIS — I2510 Atherosclerotic heart disease of native coronary artery without angina pectoris: Secondary | ICD-10-CM | POA: Diagnosis not present

## 2022-09-25 LAB — POCT INR: INR: 2 (ref 2.0–3.0)

## 2022-09-25 NOTE — Patient Instructions (Signed)
Description   Spoke with [name], RN [facility] and instructed for pt to continue taking warfarin 3 tablets daily (3mg ). Recheck INR in 3 weeks by home health.  Coumadin Clinic [phone] Fax Clearance form to [phone]

## 2022-09-29 DIAGNOSIS — M1990 Unspecified osteoarthritis, unspecified site: Secondary | ICD-10-CM | POA: Diagnosis not present

## 2022-09-29 DIAGNOSIS — I5022 Chronic systolic (congestive) heart failure: Secondary | ICD-10-CM | POA: Diagnosis not present

## 2022-09-29 DIAGNOSIS — H409 Unspecified glaucoma: Secondary | ICD-10-CM

## 2022-09-29 DIAGNOSIS — R1319 Other dysphagia: Secondary | ICD-10-CM

## 2022-09-29 DIAGNOSIS — E785 Hyperlipidemia, unspecified: Secondary | ICD-10-CM | POA: Diagnosis not present

## 2022-09-29 DIAGNOSIS — Z9181 History of falling: Secondary | ICD-10-CM

## 2022-09-29 DIAGNOSIS — I480 Paroxysmal atrial fibrillation: Secondary | ICD-10-CM | POA: Diagnosis not present

## 2022-09-29 DIAGNOSIS — J449 Chronic obstructive pulmonary disease, unspecified: Secondary | ICD-10-CM | POA: Diagnosis not present

## 2022-09-29 DIAGNOSIS — D509 Iron deficiency anemia, unspecified: Secondary | ICD-10-CM | POA: Diagnosis not present

## 2022-09-29 DIAGNOSIS — N1832 Chronic kidney disease, stage 3b: Secondary | ICD-10-CM | POA: Diagnosis not present

## 2022-09-29 DIAGNOSIS — K222 Esophageal obstruction: Secondary | ICD-10-CM | POA: Diagnosis not present

## 2022-09-29 DIAGNOSIS — I252 Old myocardial infarction: Secondary | ICD-10-CM | POA: Diagnosis not present

## 2022-09-29 DIAGNOSIS — I350 Nonrheumatic aortic (valve) stenosis: Secondary | ICD-10-CM | POA: Diagnosis not present

## 2022-09-29 DIAGNOSIS — K219 Gastro-esophageal reflux disease without esophagitis: Secondary | ICD-10-CM | POA: Diagnosis not present

## 2022-09-29 DIAGNOSIS — I2510 Atherosclerotic heart disease of native coronary artery without angina pectoris: Secondary | ICD-10-CM | POA: Diagnosis not present

## 2022-09-30 ENCOUNTER — Encounter (HOSPITAL_COMMUNITY): Payer: Self-pay | Admitting: Internal Medicine

## 2022-09-30 ENCOUNTER — Telehealth: Payer: Self-pay

## 2022-09-30 NOTE — Progress Notes (Signed)
Care Management & Coordination Services Pharmacy Team  Reason for Encounter: Appointment Reminder  Contacted patient to confirm telephone appointment with Artelia Laroche, PharmD on 10/02/22 at 3:00 pm.  Spoke with family on 09/30/2022   Do you have any problems getting your medications? No  What is your top health concern you would like to discuss at your upcoming visit? No top concerns   Have you seen any other providers since your last visit with PCP? No   Chart review:  Recent office visits:  None  Recent consult visits:  09/25/22 (Anti-Coag Visit) [place], Peter MD. No changes to Warfarin dose.   Hospital visits:  None   Star Rating Drugs:  Medication:  Last Fill: Day Supply Atorvastatin   09/29/22-08/28/22 30ds Farxiga (PAP)  Receiving shipments now  Care Gaps: Annual wellness visit in last year? No  If Diabetic:None noted  Last eye exam / retinopathy screening: Last diabetic foot exam:   Roxana Hires, [facility] Clinical Pharmacist Assistant  [phone]

## 2022-10-02 ENCOUNTER — Ambulatory Visit: Payer: Medicare HMO

## 2022-10-02 NOTE — Patient Outreach (Signed)
Care Management & Coordination Services Pharmacy Note  10/02/2022 Name:  Keith Jenkins MRN:  [id number] DOB:  [birth year]  Summary: -Very very very pleasant couple presents for f/u CCM visit. They have been married for 56 years. He worked as a Naval architect. Bonita Quin, his wife, grew up in [place]. Writes poems. Read me one she just finished called "Dust on the Bible" -Patient's dog is sick and had to be [facility]  Recommendations/Changes made from today's visit: -Daughter had questions that accidentally got routed to Cardio. Sent msg to PCP asking [place] to call her answer her question. [phone]     Plan Updates: -Social Worker Enrique Sack has patient approved for both Meals On Wheels AND a grant for heating/electric!! -MARCH UPDATE: Patient bill was covered for last month and getting Meals on Wheels daily and Mom's Meal's every 30 days. States no longer concerned about when next meal is  -Bathroom is next per SW   Subjective: Keith Jenkins is an 77 y.o. year old male who is a primary patient of Cox, Kirsten, MD.  The care coordination team was consulted for assistance with disease management and care coordination needs.    Engaged with patient by telephone for follow up visit.  Recent office visits:  04-17-2022 Marlowe Sax, RN (CCM).   04-14-2022 [name], NP. START Gabapentin 600 mg at bedtime.    02-19-2022 Blane Ohara, MD. Flu vaccine given.    01-06-2022 [name], NP. Hemo= 10.3, Hema= 32.2, MCV= 75, MCH= 24.0, RDW= 17.1. Creatinine= 1.32, eGFR= 57, Chloride= 93. HDL= 36   Recent consult visits:  04-15-2022 Memory Dance, RN (Cardiology). Anti coag visit.   04-08-2022 Beverley Fiedler, MD. ESOPHAGOGASTRODUODENOSCOPY (EGD) WITH PROPOFOL procedure.    04-01-2022 Memory Dance, RN (Cardiology). Anti coag visit.   03-31-2022 Jacklynn Ganong, FNP (Vascular). B Natriuretic Peptide= 1,681.5. Sodium= 134, Glucose= 135,  BUN= 32, Creatinine= 1.91, Calcium= 8.7, GFR, Estimated= 36. RBC= 4.10, Hemo= 10.2, HCT= 30.7, MCV= 74.9, MCH= 24.9, RDW= 29.9.    03-25-2022 Memory Dance, RN (Cardiology). Anti coag visit.   03-12-2022 Bensimhon, Bevelyn Buckles, MD  (Cardiology). RIGHT HEART CATH and THORACENTESIS procedures.   03-12-2022 Jacklynn Ganong, FNP (Vascular). B natriuretic peptide= 1,417.5. RBC= 3.96, Hemo= 8.5, HCT= 26.9, MCV= 67.9, MCH= 21.5, RDW= 19.2. BUN= 34, Creatinine= 1.96, Albumin= 3.2, Egfr= 35.   03-11-2022 Memory Dance, RN (Cardiology). Anti coag visit.   03-04-2022 Memory Dance, RN (Cardiology). Anti coag visit.   02-25-2022 Memory Dance, RN (Cardiology). Anti coag visit.   02-18-2022 Memory Dance, RN (Cardiology). Anti coag visit.   02-14-2022 Jacklynn Ganong, FNP. B natriuretic peptide= 1, 321.3. Glucose= 100, Creatinine= 1.59, Gfr= 45.   02-11-2022 Hemphill, Candance B, RN (Cardiology). Anti coag visit.   02-04-2022 Hemphill, Verl Bangs, RN (Cardiology). Anti coag visit.   02-04-2022 Pyrtle, Carie Caddy, MD Laurette Schimke). Follow up visit.   01-22-2022 [name], MD (Cardiothoracic Surgery). Follow up.   01-21-2022 Memory Dance, RN (Cardiology). Anti coag visit.   01-14-2022 Memory Dance, RN (Cardiology). Anti coag visit.   01-14-2022 Noralee Space, RN (Vascular). DECREASE Midodrine to 5 mg Three times a day and START Zetia 5 mg (1/2 tab) Daily.   01-07-2022 Memory Dance, RN (Cardiology). Anti coag visit.   12-31-2021 Memory Dance, RN (Cardiology). Anti coag visit.   12-24-2021 Memory Dance, RN (Cardiology). Anti coag visit.  12-17-2021 Memory Dance, RN (Cardiology). Anti coag visit.   12-11-2021 Memory Dance, RN (Cardiology). Anti coag visit.   12-05-2021 [name], MD (Cardiothoracic). Follow up visit.   12-04-2021 Jacklynn Ganong, FNP (Vascular). B Natriuretic Peptide= 1,268.7. Chloride= 96, Creatinine= 1.36,  Calcium= 8.5, Egfr= 55.   12-03-2021 Memory Dance, RN (Cardiology). Anti coag visit.   11-26-2021 Memory Dance, RN (Cardiology). Anti coag visit.   Hospital visits:  Medication Reconciliation was completed by comparing discharge summary, patient's EMR and Pharmacy list, and upon discussion with patient.   Admitted to the hospital on 02-20-2022 due to Fall encounter. Discharge date was 02-20-2022. Discharged from [facility].     New?Medications Started at [facility] Discharge:?? None   Medication Changes at Hospital Discharge: None   Medications Discontinued at Hospital Discharge: None   Medications that remain the same after Hospital Discharge:??  -All other medications will remain the same.     Hospital visits:  Medication Reconciliation was completed by comparing discharge summary, patient's EMR and Pharmacy list, and upon discussion with patient.   Admitted to the hospital on 02-12-2022 due to Scalp laceration. Discharge date was 02-12-2022. Discharged from [facility]     New?Medications Started at [facility] Discharge:?? None   Medication Changes at Hospital Discharge: None   Medications Discontinued at Hospital Discharge: None   Medications that remain the same after Hospital Discharge:??  -All other medications will remain the same.     Hospital visits:  Medication Reconciliation was completed by comparing discharge summary, patient's EMR and Pharmacy list, and upon discussion with patient.   Admitted to the hospital on 01-28-2022 due to Fall encounter. Discharge date was 01-28-2022. Discharged from [facility].     New?Medications Started at [facility] Discharge:?? None   Medication Changes at Hospital Discharge: None   Medications Discontinued at Hospital Discharge: None   Medications that remain the same after Hospital Discharge:??  -All other medications will remain the same.     Objective:  Lab Results  Component  Value Date   CREATININE 2.17 (H) 09/12/2022   BUN 39 (H) 09/12/2022   EGFR 31 (L) 09/12/2022   GFRNONAA 29 (L) 08/08/2022   GFRAA 65 03/27/2020   NA 135 09/12/2022   K 3.8 09/12/2022   CALCIUM 9.2 09/12/2022   CO2 24 09/12/2022   GLUCOSE 81 09/12/2022    Lab Results  Component Value Date/Time   HGBA1C 6.3 (H) 09/12/2022 11:25 AM   HGBA1C 6.0 (H) 03/18/2022 05:58 AM    Last diabetic Eye exam: No results found for: "HMDIABEYEEXA"  Last diabetic Foot exam: No results found for: "HMDIABFOOTEX"   Lab Results  Component Value Date   CHOL 75 03/18/2022   HDL 33 (L) 03/18/2022   LDLCALC 29 03/18/2022   TRIG 63 03/18/2022   CHOLHDL 2.3 03/18/2022       Latest Ref Rng & Units 09/12/2022   11:25 AM 07/21/2022   10:37 AM 06/11/2022   11:07 AM  Hepatic Function  Total Protein 6.0 - 8.5 g/dL 7.1  7.5  7.0   Albumin 3.8 - 4.8 g/dL 3.8  3.2  3.9   AST 0 - 40 IU/L 59  51  61   ALT 0 - 44 IU/L 48  36  40   Alk Phosphatase 44 - 121 IU/L 188  115  181   Total Bilirubin 0.0 - 1.2 mg/dL 1.1  1.1  1.2     Lab Results  Component Value Date/Time   TSH 1.660 07/21/2022 10:37 AM   TSH 1.049 03/18/2022 05:58 AM   TSH 1.100 03/27/2020 11:16 AM   FREET4 1.03 07/21/2022 10:37 AM   FREET4 1.24 (H) 03/18/2022 05:58 AM       Latest Ref Rng & Units 09/12/2022   11:25 AM 07/21/2022   10:37 AM 06/11/2022   11:07 AM  CBC  WBC 3.4 - 10.8 x10E3/uL 5.1  5.0  5.9   Hemoglobin 13.0 - 17.7 g/dL 16.1  09.6  04.5   Hematocrit 37.5 - 51.0 % 41.2  38.9  36.0   Platelets 150 - 450 x10E3/uL 139  PLATELET CLUMPS NOTED ON SMEAR, UNABLE TO ESTIMATE  111     Lab Results  Component Value Date/Time   VITAMINB12 812 05/14/2022 09:18 AM   VITAMINB12 606 03/13/2022 07:56 AM    Clinical ASCVD: Yes  The ASCVD Risk score (Arnett DK, et al., 2019) failed to calculate for the following reasons:   The patient has a prior MI or stroke diagnosis    Other: (CHADS2VASc if Afib, MMRC or CAT for COPD, ACT, DEXA)      04/14/2022   10:06 AM 10/23/2021   11:56 AM 07/09/2021   10:05 AM  Depression screen PHQ 2/9  Decreased Interest 0 0 0  Down, Depressed, Hopeless 0 1 0  PHQ - 2 Score 0 1 0  Altered sleeping 0 1   Tired, decreased energy 0 1   Change in appetite 0 1   Feeling bad or failure about yourself  0 1   Trouble concentrating 0 0   Moving slowly or fidgety/restless 0 0   Suicidal thoughts 0 0   PHQ-9 Score 0 5   Difficult doing work/chores Not difficult at all Not difficult at all      Social History   Tobacco Use  Smoking Status Former   Types: Cigarettes   Quit date: 1970   Years since quitting: 54.3  Smokeless Tobacco Never   BP Readings from Last 3 Encounters:  09/12/22 100/68  07/21/22 112/70  06/19/22 95/71   Pulse Readings from Last 3 Encounters:  09/12/22 60  07/21/22 65  06/19/22 (!) 58   Wt Readings from Last 3 Encounters:  09/12/22 188 lb (85.3 kg)  07/21/22 187 lb 6.4 oz (85 kg)  06/19/22 205 lb 0.4 oz (93 kg)   BMI Readings from Last 3 Encounters:  09/12/22 24.80 kg/m  07/21/22 24.06 kg/m  06/19/22 26.32 kg/m    No Known Allergies  Medications Reviewed Today     Reviewed by Blane Ohara, MD (Physician) on 09/14/22 at 1739  Med List Status: <None>   Medication Order Taking? Sig Documenting Provider Last Dose Status Informant  albuterol (PROVENTIL) (2.5 MG/3ML) 0.083% nebulizer solution [id number] Yes Inhale 3 mLs into the lungs every 6 (six) hours as needed for wheezing or shortness of breath. [name], NP Taking Active Family Member  albuterol (VENTOLIN HFA) 108 (90 Base) MCG/ACT inhaler [id number] Yes Inhale 2 puffs into the lungs every 6 (six) hours as needed for wheezing or shortness of breath. [name], NP Taking Active Family Member  amiodarone (PACERONE) 200 MG tablet [id number] Yes Take 0.5 tablets (100 mg total) by mouth daily. Bensimhon, Bevelyn Buckles, MD Taking Active Family Member  aspirin EC 81 MG tablet [id number] Yes Take 1 tablet  (81 mg total) by mouth daily. Swallow whole. McCook, Anderson Malta, FNP Taking Active Family Member  atorvastatin (LIPITOR) 80 MG tablet  [id number] Yes TAKE ONE TABLET BY MOUTH EVERYDAY AT BEDTIME Bensimhon, Bevelyn Buckles, MD Taking Active   cyanocobalamin (VITAMIN B12) 1000 MCG/ML injection [id number] Yes Inject 1 mL (1,000 mcg total) into the muscle every 30 (thirty) days. Cox, Kirsten, MD Taking Active   dapagliflozin propanediol (FARXIGA) 10 MG TABS tablet [id number] Yes Take 1 tablet (10 mg total) by mouth daily. Bensimhon, Bevelyn Buckles, MD Taking Active Family Member  digoxin (LANOXIN) 0.125 MG tablet [id number] Yes TAKE 1/2 TABLET BY MOUTH ONCE DAILY Bensimhon, Bevelyn Buckles, MD Taking Active   escitalopram (LEXAPRO) 10 MG tablet [id number] Yes Take 1 tablet (10 mg total) by mouth daily. [name], NP Taking Active   esomeprazole (NEXIUM) 40 MG capsule [id number] Yes Take 40 mg by mouth daily. [provider] Taking Active Family Member  ezetimibe (ZETIA) 10 MG tablet [id number] Yes Take 0.5 tablets (5 mg total) by mouth daily. [name], NP Taking Active   Fluticasone-Umeclidin-Vilant (TRELEGY ELLIPTA) 100-62.5-25 MCG/ACT AEPB [id number] No Inhale 1 puff into the lungs daily.  Patient not taking: Reported on 09/12/2022   [name], NP Not Taking Active Family Member  gabapentin (NEURONTIN) 600 MG tablet [id number] Yes Take 1 tablet (600 mg total) by mouth at bedtime. [name], NP Taking Active Family Member  Iron, Ferrous Sulfate, 325 (65 Fe) MG TABS [id number] Yes Take 325 mg by mouth daily. [name], NP Taking Active Family Member  latanoprost (XALATAN) 0.005 % ophthalmic solution [id number] Yes Place 1 drop into both eyes at bedtime. [provider] Taking Active Family Member  midodrine (PROAMATINE) 10 MG tablet [id number] Yes Take 1 tablet (10 mg total) by mouth 3 (three) times daily. [name], NP Taking Active Family Member  nitroGLYCERIN (NITROSTAT)  0.4 MG SL tablet [id number] Yes Place 1 tablet (0.4 mg total) under the tongue every 5 (five) minutes as needed for chest pain. Jacklynn Ganong, FNP Taking Active Family Member  OVER THE COUNTER MEDICATION [id number] Yes Apply 1 application  topically at bedtime. Medication: Mag Maxx Cream Back and restless leg [provider] Taking Active Family Member  potassium chloride SA (KLOR-CON M) 20 MEQ tablet [id number] Yes TAKE TWO TABLETS BY MOUTH ONCE DAILY Bensimhon, Bevelyn Buckles, MD Taking Active   spironolactone (ALDACTONE) 25 MG tablet [id number] Yes Take 1 tablet (25 mg total) by mouth daily. Bensimhon, Bevelyn Buckles, MD Taking Active Family Member  torsemide (DEMADEX) 20 MG tablet [id number] Yes Take 4 tablets (80 mg total) by mouth daily. Bensimhon, Bevelyn Buckles, MD Taking Active   warfarin (COUMADIN) 1 MG tablet [id number] Yes Take 2-3 tablets BY MOUTH daily AS DIRECTED by coumadin clinic Bensimhon, Bevelyn Buckles, MD Taking Active             SDOH:  (Social Determinants of Health) assessments and interventions performed: Yes SDOH Interventions    Flowsheet Row Care Coordination from 08/28/2022 in [facility] Care Coordination from 07/04/2022 in [facility] Care Coordination from 06/20/2022 in [facility] Chronic Care Management from 05/22/2022 in [facility] Chronic Care Management from 04/24/2022 in [facility] Telephone from 04/18/2022 in [facility] Care Coordination  SDOH Interventions        Food Insecurity Interventions -- Other (Comment)  [Moms Meals, referral to meals on wheels] -- -- -- Other (Comment)  [Mailed food pantry list]  Housing Interventions -- Other (Comment)  [Bathroom modifications and floor repairs needed -  referred to [facility] in Nov 2023, vm left requesting status of referral] -- -- -- --  Transportation Interventions Other (Comment)  [Meds delivered  and SW involved] -- Other (Comment)  [Meds delivered] Other (Comment)  [Pharmacy delivering meds now] Intervention Not Indicated --  Utilities Interventions -- -- -- -- -- Other (Comment)  [Mailed utility assistance resources]  Financial Strain Interventions Other (Comment)  [PAP and LIS completed] Other (Comment)  [Request assistance with [employer]] Other (Comment)  [PAP] Other (Comment)  [PAP's] Other (Comment)  [PAP] --      SDOH Screenings   Food Insecurity: Food Insecurity Present (07/04/2022)  Housing: Low Risk  (07/04/2022)  Transportation Needs: Unmet Transportation Needs (08/28/2022)  Utilities: Not At Risk (04/18/2022)  Alcohol Screen: Low Risk  (04/17/2022)  Depression (PHQ2-9): Low Risk  (04/14/2022)  Financial Resource Strain: High Risk (08/28/2022)  Physical Activity: Sufficiently Active (04/14/2022)  Social Connections: Moderately Integrated (04/17/2022)  Stress: No Stress Concern Present (04/14/2022)  Tobacco Use: Medium Risk (09/14/2022)    Medication Assistance:  See CP   Name and location of Current pharmacy:  [facility] [place], [place] - [address] [place] [address] Phone: [phone] Fax: [phone]  [facility] - [place], [place] - [address] [place] [address] Phone: [phone] Fax: [phone]    Compliance/Adherence/Medication fill history: Care Gaps: Last Annual Wellness visit? None Last Colonoscopy? None Last Mammogram? None Last Bone Denisty? None AWV never done Shingrix overdue Colonoscopy Covid booster overdue   Last eye exam / retinopathy screening? [facility] this year Last diabetic foot exam? None   Assessment/Plan   CAD: (LDL goal < 70) - [facility] 05/23: 100% m RCA and 90% m LAD.  - Echo (5/23) EF 25%, RV moderately reduced, moderate to severe MR - s/p CABG x 2 (LIMA-LAD, SVG-Ramus INT)  (11/01/21) The ASCVD Risk score  (Arnett DK, et al., 2019) failed to calculate for the following reasons:   The patient has a prior MI or stroke diagnosis Lab Results  Component Value Date   CHOL 75 03/18/2022   CHOL 131 01/06/2022   CHOL 194 07/09/2021   Lab Results  Component Value Date   HDL 33 (L) 03/18/2022   HDL 36 (L) 01/06/2022   HDL 55 07/09/2021   Lab Results  Component Value Date   LDLCALC 29 03/18/2022   LDLCALC 79 01/06/2022   LDLCALC 129 (H) 07/09/2021   Lab Results  Component Value Date   TRIG 63 03/18/2022   TRIG 79 01/06/2022   TRIG 55 07/09/2021   Lab Results  Component Value Date   CHOLHDL 2.3 03/18/2022   CHOLHDL 3.6 01/06/2022   CHOLHDL 3.5 07/09/2021   No results found for: "LDLDIRECT" Last vitamin D No results found for: "25OHVITD2", "25OHVITD3", "VD25OH" Lab Results  Component Value Date   TSH 1.660 07/21/2022   -Controlled -Current treatment: Atorvastatin  Appropriate, Effective, Safe, Accessible ASA  Appropriate, Effective, Safe, Accessible Ezetimibe  Appropriate, Effective, Safe, Accessible -Medications previously tried: N/A  -Current dietary patterns: "Tries to eat healthy" -Current exercise habits: N/A -Educated on Cholesterol goals;  -Recommended to continue current medication   Pre-Diabetes (A1c goal <6.5%) Lab Results  Component Value Date   HGBA1C 6.3 (H) 09/12/2022   HGBA1C 6.0 (H) 03/18/2022   HGBA1C 6.0 (H) 11/01/2021   Lab Results  Component Value Date   LDLCALC 29 03/18/2022   CREATININE 2.17 (  H) 09/12/2022    Lab Results  Component Value Date   NA 135 09/12/2022   K 3.8 09/12/2022   CREATININE 2.17 (H) 09/12/2022   EGFR 31 (L) 09/12/2022   GFRNONAA 29 (L) 08/08/2022   GLUCOSE 81 09/12/2022    Lab Results  Component Value Date   WBC 5.1 09/12/2022   HGB 13.8 09/12/2022   HCT 41.2 09/12/2022   MCV 86 09/12/2022   PLT 139 (L) 09/12/2022    No results found for: "LABMICR", "MICROALBUR" -Controlled -Current  medications: Dapagliflozin Appropriate, Effective, Safe, Accessible PAP Approved 2024 -Medications previously tried: N/A  -Current home glucose readings fasting glucose:  March 2024: 130-140's -Denies hypoglycemic/hyperglycemic symptoms -Educated on A1c and blood sugar goals; -Counseled to check feet daily and get yearly eye exams -November 2023: Start PAP for Dapagliflozin December 2023: Dapagliflozin PAP submitted at beginning of month. Sent msg to [facility] via teams to call company to check on status March 2024: Patient's wife, who manages sugars, was very sick and in the ER last night. When I asked for sugars/BP, they gave me a range. Will have Malecca call in 7 days to get sugars/BP so as to not make Bonita Quin have to get up. Goal sugars is <140. April 2024: Recommend maintain therapy   Atrial Fibrillation (Goal: prevent stroke and major bleeding) BP Readings from Last 3 Encounters:  09/12/22 100/68  07/21/22 112/70  06/19/22 95/71    Pulse Readings from Last 3 Encounters:  09/12/22 60  07/21/22 65  06/19/22 (!) 58   Lab Results  Component Value Date   DIGOXIN 0.8 05/15/2022  -Controlled -CHADSVASC: 6 -Current treatment: Rhythm control:  Amiodarone 100mg  QD Appropriate, Effective, Safe, Accessible Digoxin 0.0625mg  Appropriate, Effective, Safe, Accessible Anticoagulation:  Warfarin UD Appropriate, Effective, Safe, Accessible -Medications previously tried: N/A -Home BP and HR readings: Doesn't test  -Counseled on increased risk of stroke due to Afib and benefits of anticoagulation for stroke prevention; November 2023: Will have CCM team look into potential of a DOAC and see if patient has spent 3% on meds. If so, will contact Cardio Jan 2024: Patient still hasn't brought in cost statement for DOAC. Sent msg to my team to call Pharmacy ourselves March 2024: LIS was completed last month for patient. Coordinated with Kayla at [facility] to see if it went through. Doesn't look like  it yet but it normally takes 30 days and we're around 2 weeks. Will run another script at f/u. Goal is to get patient on DOAC's if LIS approved -Patient's wife, who manages sugars, was very sick and in the ER last night. When I asked for sugars/BP, they gave me a range. Will have Malecca call in 7 days to get sugars/BP so as to not make Bonita Quin have to get up. Goal Systolic <140 April 2024: Recommend maintain therapy   Heart Failure (Goal: manage symptoms and prevent exacerbations) Lab Results  Component Value Date   DIGOXIN 0.8 05/15/2022   Lab Results  Component Value Date   CREATININE 2.17 (H) 09/12/2022   BUN 39 (H) 09/12/2022   NA 135 09/12/2022   K 3.8 09/12/2022   CL 93 (L) 09/12/2022   CO2 24 09/12/2022   -Controlled -Last ejection fraction:  EF 25-30 % per ECHO, attends HF clinic s/p late presentation inferior STEMI 05/23 Echo (5/23): EF 25-30%, RV moderately reduced, moderate to severe MR. Limited echo on admit (5/23): EF 30-35%, RV moderately reduced, MR not well evaluated, moderate pericardial effusion S/p CABG + bioprosthetic MVR  11/01/21 Echo 03/12/22 showed EF 20-25%,  -HF type: HFrEF (EF < 40%) -NYHA Class: IV (significant limitation of activity / dyspnea at rest) -AHA HF Stage: D (Advanced disease requiring aggressive medical therapy) -Current treatment: Dapagliflozin 10mg  Appropriate, Effective, Safe, Accessible PAP Approved 2024 Digoxin 0.625mg  Appropriate, Effective, Safe, Accessible Midrodine 10mg  TID Appropriate, Effective, Safe, Accessible KCl Appropriate, Effective, Safe, Accessible Spironolactone 25mg  Appropriate, Effective, Safe, Accessible Torsemide 80mg  Appropriate, Effective, Safe, Accessible -Medications previously tried: N/A  -Current home BP/HR readings: March 2024: 108-125 systolic is normal for patient -Current home weight readings: Wt Readings from Last 3 Encounters:  09/12/22 188 lb (85.3 kg)  07/21/22 187 lb 6.4 oz (85 kg)  06/19/22 205 lb  0.4 oz (93 kg)  March 2024: 170-171 pounds per patient States his legs are looking healthy -Current dietary habits: "Tries to eat healthy" -Current exercise habits: N/A -Educated on Benefits of medications for managing symptoms and prolonging life November 2023: Star Farxiga PAP. Called [employer], spoke with Dorene Sorrow, he's going to credit patient $40 (They accidentally messed up charging for patient. Unsure of details). Coordinated with Archer Asa is going to work on a grant for Spironolactone and Digoxin via direct message. Will get a month's worth of samples to last December 2023: Sent Archer Asa the following direct msg, "Howdy! I believe you and I spoke on 04/24/22 about this patient and a grant. I didn't see a note and wanted to check on the status of things. Thank you and I hope you're having a wonderful day!" -Asked CCM team about [employer] grant and ability to use  March 2024: PAP's are approved and patient's BP is running good. He does state he gets a little dizzy in the morning but it goes away after 5 minutes. He thinks it's due to Gabapentin. Will let PCP know -Patient states his weight past week is 170-171 lbs but weight was 187 in office, will send msg to Cardio about concerns for over-diuresis or losing too much weight for alternative reason April 2024: Recommend maintain therapy   COPD (Goal: control symptoms and prevent exacerbations) -Controlled     No data to display         Tobacco Use: Medium Risk (09/14/2022)   Patient History    Smoking Tobacco Use: Former    Smokeless Tobacco Use: Never    Passive Exposure: Not on file   -Current treatment  Albuterol Appropriate, Effective, Safe, Accessible Trelegy Appropriate, Effective, Safe, Accessible 2023: Approved 2024: Approved -Medications previously tried: N/A  -Gold Grade: Unknown -Current COPD Classification:  Unknown -MMRC/CAT score:      No data to display         -Patient denies  consistent use of maintenance inhaler -Counseled on Proper inhaler technique; November 2023: Stared PAP for Trelegy December 2023: Dapagliflozin PAP submitted at beginning of month. Sent msg to [facility] via teams to call company to check on status Jan 2024: Will have team call company to check on status March 2024: Will have Malecca reach out for CAT score next week when she calls for BP/Sugars. Unable to complete CAT score in the past since I normally deal with patient's wife  CPP F/U July 2024  Artelia Laroche, [place].D. - [phone]

## 2022-10-06 ENCOUNTER — Ambulatory Visit: Payer: Self-pay

## 2022-10-06 NOTE — Patient Outreach (Signed)
  Care Coordination   Follow Up Visit Note   10/06/2022 Name: Ryosuke Ericksen MRN: [id number] DOB: 1946-01-25  Suresh Audi is a 77 y.o. year old male who sees Cox, Kirsten, MD for primary care. I  spoke with patients spouse and caregiver Yul Diana by phone.  What matters to the patients health and wellness today?  Patient would like to move forward with bathroom modifications    Goals Addressed             This Visit's Progress    Care Coordination Activities       Care Coordination Interventions: Determined the patient received a letter in the mail indicating he is not eligible for Medicare Extra Help Discussed patient is doing well at home, still awaiting bathroom modifications from [facility]. Patient reports he has not heard from the agency in several weeks Advised SW will outreach to ensure there is not anything the patient is supposed to be working on in order to move forward Outbound call placed to [employer]; voice message left for [employer] requesting a return call         SDOH assessments and interventions completed:  No     Care Coordination Interventions:  Yes, provided   Interventions Today    Flowsheet Row Most Recent Value  Chronic Disease   Chronic disease during today's visit Chronic Kidney Disease/End Stage Renal Disease (ESRD), Congestive Heart Failure (CHF)  General Interventions   General Interventions Discussed/Reviewed General Interventions Reviewed, Community Resources        Follow up plan:  SW will continue to follow    Encounter Outcome:  Pt. Visit Completed   Bevelyn Ngo, Kenard Gower, CDP Social Worker, Certified Dementia Practitioner [facility] Care Management  Care Coordination [phone]

## 2022-10-06 NOTE — Patient Instructions (Signed)
Visit Information  Thank you for taking time to visit with me today. Please don't hesitate to contact me if I can be of assistance to you.   Following are the goals we discussed today:  -SW will follow up with [facility] to assess status of your referral -Contact your primary care provider as needed   If you are experiencing a Mental Health or Behavioral Health Crisis or need someone to talk to, please call 911  Patient verbalizes understanding of instructions and care plan provided today and agrees to view in MyChart. Active MyChart status and patient understanding of how to access instructions and care plan via MyChart confirmed with patient.     Bevelyn Ngo, BSW, CDP Social Worker, Certified Dementia Practitioner [facility] Care Management  Care Coordination [phone]

## 2022-10-10 ENCOUNTER — Encounter: Payer: Self-pay | Admitting: Family Medicine

## 2022-10-13 NOTE — Patient Outreach (Signed)
  Care Coordination    10/13/2022 Name: Vi Kanan MRN: [id number] DOB: [birth year]  Hyrum Canter is a 77 y.o. year old male who sees Cox, Kirsten, MD for primary care. I  collaborated with Trixie Dredge with [facility] to follow up on the status of patients referral. Ms. Herold Harms indicates she mailed an intake packet to the patients home in February. She will follow up with him to determine if there are any barriers with completion.   SDOH assessments and interventions completed:  No     Care Coordination Interventions:  Yes, provided   Interventions Today    Flowsheet Row Most Recent Value  Chronic Disease   Chronic disease during today's visit Chronic Kidney Disease/End Stage Renal Disease (ESRD), Congestive Heart Failure (CHF)  General Interventions   General Interventions Discussed/Reviewed [employer], Communication with  Communication with --  [[facility]]        Follow up plan:  SW will continue to follow    Encounter Outcome:  Pt. Visit Completed   Bevelyn Ngo, Kenard Gower, CDP Social Worker, Certified Dementia Practitioner [facility] Care Management  Care Coordination [phone]

## 2022-10-14 ENCOUNTER — Other Ambulatory Visit: Payer: Self-pay | Admitting: Family Medicine

## 2022-10-15 ENCOUNTER — Ambulatory Visit: Payer: Self-pay

## 2022-10-15 NOTE — Patient Instructions (Signed)
Visit Information  Thank you for taking time to visit with me today. Please don't hesitate to contact me if I can be of assistance to you.   Following are the goals we discussed today:  -Gather financial documents needed to submit to [facility] for bathroom repairs -Engage with home health RN during home visit on 5/9 -Follow up with [facility] if you do not receive your medication  -Call your primary care provider as needed   If you are experiencing a Mental Health or Behavioral Health Crisis or need someone to talk to, please call 911  Patient verbalizes understanding of instructions and care plan provided today and agrees to view in MyChart. Active MyChart status and patient understanding of how to access instructions and care plan via MyChart confirmed with patient.     Bevelyn Ngo, BSW, CDP Social Worker, Certified Dementia Practitioner [facility] Care Management  Care Coordination [phone]

## 2022-10-15 NOTE — Patient Outreach (Signed)
  Care Coordination   Follow Up Visit Note   10/15/2022 Name: Keith Jenkins MRN: [id number] DOB: Jun 29, 1945  Keith Jenkins is a 77 y.o. year old male who sees Cox, Kirsten, MD for primary care. I  spoke with patients spouse and caregiver by phone to follow up on care coordination needs.  What matters to the patients health and wellness today?  Patients Torsemide dose has increased and he is in need of a refill    Goals Addressed             This Visit's Progress    Care Coordination Activities   On track    Care Coordination Interventions: Determined the patient has been in contact with [facility] regarding referral for bathroom modifications Patients wife and his daughter are working to gather financial documents to submit for the program to review and move forward with next steps Discussed the patients Torsemide has been increased to 4 per day and he is in need of a refill; communication sent to [facility] requesting medication delivery Mrs. Strough reports she is concerned the patient may be retaining fluid in his lungs again due to difficulty sleeping which she reports happened last time he was retaining fluid. Patient continues to weigh daily and has not noticed any weight gain Determined the patients home health nurse is scheduled to visit the home on 5/9 Collaboration with RN Care Manager Alto [place] to advise of concern with fluid retention Discussed the patient continues to receive meals on wheels daily; SW will follow up over the next month regarding social work resource needs        SDOH assessments and interventions completed:  No     Care Coordination Interventions:  Yes, provided   Interventions Today    Flowsheet Row Most Recent Value  Chronic Disease   Chronic disease during today's visit Congestive Heart Failure (CHF), Chronic Kidney Disease/End Stage Renal Disease (ESRD)  General Interventions   General Interventions  Discussed/Reviewed Communication with, [facility], General Interventions Discussed  Communication with Pharmacists, RN        Follow up plan:  SW will continue to follow    Encounter Outcome:  Pt. Visit Completed   Bevelyn Ngo, Kenard Gower, CDP Social Worker, Certified Dementia Practitioner [facility] Care Management  Care Coordination [phone]

## 2022-10-16 ENCOUNTER — Other Ambulatory Visit (HOSPITAL_COMMUNITY): Payer: Self-pay

## 2022-10-16 ENCOUNTER — Ambulatory Visit (INDEPENDENT_AMBULATORY_CARE_PROVIDER_SITE_OTHER): Payer: Medicare HMO | Admitting: *Deleted

## 2022-10-16 ENCOUNTER — Telehealth: Payer: Self-pay

## 2022-10-16 ENCOUNTER — Encounter (HOSPITAL_COMMUNITY): Payer: Self-pay | Admitting: Internal Medicine

## 2022-10-16 DIAGNOSIS — M1990 Unspecified osteoarthritis, unspecified site: Secondary | ICD-10-CM | POA: Diagnosis not present

## 2022-10-16 DIAGNOSIS — E785 Hyperlipidemia, unspecified: Secondary | ICD-10-CM | POA: Diagnosis not present

## 2022-10-16 DIAGNOSIS — Z7982 Long term (current) use of aspirin: Secondary | ICD-10-CM | POA: Diagnosis not present

## 2022-10-16 DIAGNOSIS — Z952 Presence of prosthetic heart valve: Secondary | ICD-10-CM | POA: Diagnosis not present

## 2022-10-16 DIAGNOSIS — D509 Iron deficiency anemia, unspecified: Secondary | ICD-10-CM | POA: Diagnosis not present

## 2022-10-16 DIAGNOSIS — Z5181 Encounter for therapeutic drug level monitoring: Secondary | ICD-10-CM

## 2022-10-16 DIAGNOSIS — N1832 Chronic kidney disease, stage 3b: Secondary | ICD-10-CM | POA: Diagnosis not present

## 2022-10-16 DIAGNOSIS — Z9181 History of falling: Secondary | ICD-10-CM | POA: Diagnosis not present

## 2022-10-16 DIAGNOSIS — Z951 Presence of aortocoronary bypass graft: Secondary | ICD-10-CM | POA: Diagnosis not present

## 2022-10-16 DIAGNOSIS — I480 Paroxysmal atrial fibrillation: Secondary | ICD-10-CM | POA: Diagnosis not present

## 2022-10-16 DIAGNOSIS — I350 Nonrheumatic aortic (valve) stenosis: Secondary | ICD-10-CM | POA: Diagnosis not present

## 2022-10-16 DIAGNOSIS — I5022 Chronic systolic (congestive) heart failure: Secondary | ICD-10-CM | POA: Diagnosis not present

## 2022-10-16 DIAGNOSIS — K219 Gastro-esophageal reflux disease without esophagitis: Secondary | ICD-10-CM | POA: Diagnosis not present

## 2022-10-16 DIAGNOSIS — I2510 Atherosclerotic heart disease of native coronary artery without angina pectoris: Secondary | ICD-10-CM | POA: Diagnosis not present

## 2022-10-16 DIAGNOSIS — R1319 Other dysphagia: Secondary | ICD-10-CM | POA: Diagnosis not present

## 2022-10-16 DIAGNOSIS — Z7901 Long term (current) use of anticoagulants: Secondary | ICD-10-CM | POA: Diagnosis not present

## 2022-10-16 DIAGNOSIS — K222 Esophageal obstruction: Secondary | ICD-10-CM | POA: Diagnosis not present

## 2022-10-16 DIAGNOSIS — I252 Old myocardial infarction: Secondary | ICD-10-CM | POA: Diagnosis not present

## 2022-10-16 DIAGNOSIS — Z954 Presence of other heart-valve replacement: Secondary | ICD-10-CM | POA: Diagnosis not present

## 2022-10-16 DIAGNOSIS — Z87891 Personal history of nicotine dependence: Secondary | ICD-10-CM | POA: Diagnosis not present

## 2022-10-16 DIAGNOSIS — J449 Chronic obstructive pulmonary disease, unspecified: Secondary | ICD-10-CM | POA: Diagnosis not present

## 2022-10-16 DIAGNOSIS — H409 Unspecified glaucoma: Secondary | ICD-10-CM | POA: Diagnosis not present

## 2022-10-16 LAB — POCT INR: INR: 2.1 (ref 2.0–3.0)

## 2022-10-16 MED ORDER — TORSEMIDE 20 MG PO TABS: 80.0000 mg | ORAL_TABLET | Freq: Every day | ORAL | 3 refills | Status: DC

## 2022-10-16 MED ORDER — TORSEMIDE 20 MG PO TABS: 80.0000 mg | ORAL_TABLET | Freq: Every day | ORAL | 1 refills | Status: DC

## 2022-10-16 NOTE — Progress Notes (Addendum)
10-16-2022: Received a message from Bevelyn Ngo that patient's wife stated torsemide 20 mg was increased to 4 times daily so patient is running low on supply.  Left a VM with Dr. Prescott Gum nurse to clarify if patient should be taking torsemide 20 mg 4 times daily or once daily. Contacted patients wife and she stated patient has been taking 4 times daily for a while. Told wife cardiology sent in 20 mg daily in April. Will update patient's wife once cardiology responds and that I am waiting on a response from cardiology to clarify. Rx was sent in for 20 Mg 4 times daily quantity of 90. Noticed daughter sent a mychart message today so I contacted wife to ask daughter to request the correct quantity. I also left another VM on the triage line.  Huey Romans [facility] Clinical Pharmacist Assistant [phone]

## 2022-10-16 NOTE — Telephone Encounter (Signed)
Called and spoke with [name] [facility] nurse. [name] stated she is going to pt's home this afternoon and will call [facility] with results.

## 2022-10-16 NOTE — Patient Instructions (Signed)
Description   Spoke with [name], RN [facility] and instructed for pt to continue taking warfarin 3 tablets daily (3mg ). Recheck INR in 4 weeks by home health.  [facility] [phone] Fax Clearance form to [phone]

## 2022-10-21 ENCOUNTER — Encounter (HOSPITAL_COMMUNITY): Payer: Self-pay | Admitting: Internal Medicine

## 2022-10-21 ENCOUNTER — Telehealth: Payer: Self-pay

## 2022-10-21 NOTE — Progress Notes (Unsigned)
Care Management & Coordination Services Pharmacy Team  Reason for Encounter: Medication coordination and delivery  Contacted patient to discuss medications and coordinate delivery from [facility]. {US HC Outreach:28874} Cycle dispensing form sent to *** for review.   Last adherence delivery date: 10-01-2022  Patient is due for next adherence delivery on: 10-31-2022  This delivery to include: Vials  30 Days  Amiodarone 100 mg daily Atorvastatin 80 mg at Bedtime Digoxin 0.125 mg half tablet daily Lexapro 10 mg daily Zetia 10 mg half tablet daily Gabapentin 600 mg at Bedtime Potassium chloride 20 meq 2 tablets daily Spironolactone 25 mg daily Warfarin 1 mg 2-3 tabs daily as directed  Torsemide 20 mg 4 tablets daily  Aspirin 81 mg daily   Patient declined the following medications this month: Midodrine- PAP Proventil- Plenty supply Ventolin- plenty supply Farxiga- PAP  No refill request needed.  {Delivery WUJW:11914}   Any concerns about your medications? {yes/no:20286}  How often do you forget or accidentally miss a dose? {Missed doses:25554}  Do you use a pillbox? {yes/no:20286}  Is patient in packaging {yes/no:20286}  If yes  What is the date on your next pill pack?  Any concerns or issues with your packaging?   Recent blood pressure readings are as follows:***  Recent blood glucose readings are as follows:***   Chart review: Recent office visits:  10-15-2022 Bevelyn Ngo [facility])  10-06-2022 Bevelyn Ngo [facility])  Recent consult visits:  10-16-2022 Kandace Parkins, RN (Cardiology). Anti-coag visit  Hospital visits:  None in previous 6 months  Medications: Outpatient Encounter Medications as of 10/21/2022  Medication Sig   albuterol (PROVENTIL) (2.5 MG/3ML) 0.083% nebulizer solution Inhale 3 mLs into the lungs every 6 (six) hours as needed for wheezing or shortness of breath.   albuterol (VENTOLIN HFA) 108 (90 Base) MCG/ACT inhaler Inhale  2 puffs into the lungs every 6 (six) hours as needed for wheezing or shortness of breath.   amiodarone (PACERONE) 200 MG tablet Take 0.5 tablets (100 mg total) by mouth daily.   aspirin EC 81 MG tablet Take 1 tablet (81 mg total) by mouth daily. Swallow whole.   atorvastatin (LIPITOR) 80 MG tablet TAKE ONE TABLET BY MOUTH EVERYDAY AT BEDTIME   cyanocobalamin (VITAMIN B12) 1000 MCG/ML injection Inject 1 mL (1,000 mcg total) into the muscle every 30 (thirty) days.   dapagliflozin propanediol (FARXIGA) 10 MG TABS tablet Take 1 tablet (10 mg total) by mouth daily.   digoxin (LANOXIN) 0.125 MG tablet TAKE 1/2 TABLET BY MOUTH ONCE DAILY   escitalopram (LEXAPRO) 10 MG tablet Take 1 tablet (10 mg total) by mouth daily.   esomeprazole (NEXIUM) 40 MG capsule Take 40 mg by mouth daily.   ezetimibe (ZETIA) 10 MG tablet Take 0.5 tablets (5 mg total) by mouth daily.   Fluticasone-Umeclidin-Vilant (TRELEGY ELLIPTA) 100-62.5-25 MCG/ACT AEPB Inhale 1 puff into the lungs daily. (Patient not taking: Reported on 09/12/2022)   gabapentin (NEURONTIN) 600 MG tablet Take 1 tablet (600 mg total) by mouth at bedtime.   Iron, Ferrous Sulfate, 325 (65 Fe) MG TABS Take 325 mg by mouth daily.   latanoprost (XALATAN) 0.005 % ophthalmic solution Place 1 drop into both eyes at bedtime.   midodrine (PROAMATINE) 10 MG tablet Take 1 tablet (10 mg total) by mouth 3 (three) times daily.   nitroGLYCERIN (NITROSTAT) 0.4 MG SL tablet Place 1 tablet (0.4 mg total) under the tongue every 5 (five) minutes as needed for chest pain.   OVER THE COUNTER MEDICATION Apply  1 application  topically at bedtime. Medication: Mag Maxx Cream Back and restless leg   potassium chloride SA (KLOR-CON M) 20 MEQ tablet TAKE TWO TABLETS BY MOUTH ONCE DAILY   spironolactone (ALDACTONE) 25 MG tablet Take 1 tablet (25 mg total) by mouth daily.   torsemide (DEMADEX) 20 MG tablet Take 4 tablets (80 mg total) by mouth daily.   warfarin (COUMADIN) 1 MG tablet Take  2-3 tablets BY MOUTH daily AS DIRECTED by coumadin clinic   No facility-administered encounter medications on file as of 10/21/2022.   BP Readings from Last 3 Encounters:  09/12/22 100/68  07/21/22 112/70  06/19/22 95/71    Pulse Readings from Last 3 Encounters:  09/12/22 60  07/21/22 65  06/19/22 (!) 58    Lab Results  Component Value Date/Time   HGBA1C 6.3 (H) 09/12/2022 11:25 AM   HGBA1C 6.0 (H) 03/18/2022 05:58 AM   Lab Results  Component Value Date   CREATININE 2.17 (H) 09/12/2022   BUN 39 (H) 09/12/2022   GFRNONAA 29 (L) 08/08/2022   GFRAA 65 03/27/2020   NA 135 09/12/2022   K 3.8 09/12/2022   CALCIUM 9.2 09/12/2022   CO2 24 09/12/2022  10-21-2022: 1st attempt left VM   [place] CMA Clinical Pharmacist Assistant [phone]

## 2022-10-28 ENCOUNTER — Telehealth: Payer: Self-pay

## 2022-10-28 NOTE — Progress Notes (Signed)
10-28-2022: Contacted patient's wife to confirm if torsemide is needed before delivery Friday. Patient's wife stated she [facility] this morning and torsemide will be delivered today.  Huey Romans [facility] Clinical Pharmacist Assistant [phone]

## 2022-11-05 ENCOUNTER — Ambulatory Visit: Payer: Self-pay

## 2022-11-05 NOTE — Patient Outreach (Signed)
  Care Coordination   Follow Up Visit Note   11/05/2022 Name: Keith Jenkins MRN: [id number] DOB: 12-18-1945  Keith Jenkins is a 77 y.o. year old male who sees Cox, Kirsten, MD for primary care. I  spoke with patients spouse Keith Jenkins by phone to follow up on care coordination needs.  What matters to the patients health and wellness today?  Patients family is working on obtaining bathroom modifications to make the patients shower more accessible.     Goals Addressed             This Visit's Progress    COMPLETED: Care Coordination Activities       Care Coordination Interventions: Determined the patient has received appropriate medications from [facility] team Discussed Ms. Olcott and her daughter continue to work on gathering necessary documents to submit to [facility] in order to move forward with bathroom modifications Encouraged the patient to contact SW as needed Collaboration with RN Care Manager Alto [place] and PharmD Artelia Laroche advising of plan for SW to sign off as patient has been linked to resources. SW is available to assist with future care coordination needs should they arise        SDOH assessments and interventions completed:  No     Care Coordination Interventions:  Yes, provided   Interventions Today    Flowsheet Row Most Recent Value  Chronic Disease   Chronic disease during today's visit Congestive Heart Failure (CHF), Chronic Kidney Disease/End Stage Renal Disease (ESRD)  General Interventions   General Interventions Discussed/Reviewed General Interventions Reviewed, Communication with  Communication with Pharmacists, RN        Follow up plan: No further intervention required. The patient will remain engaged with RN Care Manager and PharmD to assist with ongoing care management needs.    Encounter Outcome:  Pt. Visit Completed   Bevelyn Ngo, BSW, CDP Social Worker, Certified Dementia Practitioner [facility] Care  Management  Care Coordination [phone]

## 2022-11-05 NOTE — Patient Instructions (Signed)
Visit Information  Thank you for taking time to visit with me today. Please don't hesitate to contact me if I can be of assistance to you.   Following are the goals we discussed today:  -Submit requested documents to [facility] to move forward with bathroom modifications -Call me as needed   If you are experiencing a Mental Health or Behavioral Health Crisis or need someone to talk to, please call 1-800-273-TALK (toll free, 24 hour hotline) call 911  Patient verbalizes understanding of instructions and care plan provided today and agrees to view in MyChart. Active MyChart status and patient understanding of how to access instructions and care plan via MyChart confirmed with patient.     No further follow up required: Please contact your primary care provider as needed.  Bevelyn Ngo, BSW, CDP Social Worker, Certified Dementia Practitioner [facility] Care Management  Care Coordination [phone]

## 2022-11-12 ENCOUNTER — Telehealth: Payer: Medicare HMO

## 2022-11-12 ENCOUNTER — Ambulatory Visit (INDEPENDENT_AMBULATORY_CARE_PROVIDER_SITE_OTHER): Payer: Medicare HMO

## 2022-11-12 DIAGNOSIS — I5022 Chronic systolic (congestive) heart failure: Secondary | ICD-10-CM

## 2022-11-12 DIAGNOSIS — N1832 Chronic kidney disease, stage 3b: Secondary | ICD-10-CM

## 2022-11-12 DIAGNOSIS — I10 Essential (primary) hypertension: Secondary | ICD-10-CM

## 2022-11-12 NOTE — Chronic Care Management (AMB) (Signed)
Chronic Care Management   CCM RN Visit Note  11/12/2022 Name: Keith Jenkins MRN: [id number] DOB: 1946/01/28  Subjective: Keith Jenkins is a 77 y.o. year old male who is a primary care patient of Jenkins, Kirsten, MD. The patient was referred to the Chronic Care Management team for assistance with care management needs subsequent to provider initiation of CCM services and plan of care.    Today's Visit:   spoke to the patients wife and DPR, Keith Jenkins  for follow up visit.        Goals Addressed             This Visit's Progress    CCM (KIDNEY FAILURE)  EXPECTED OUTCOME:  MONITOR, SELF-MANAGE AND REDUCE SYMPTOMS OF KIDNEY FAILURE       Current Barriers:  Knowledge Deficits related to the importance of normalized blood pressures, compliance with medications, and monitoring for changes in the patients urinary and kidney function Care Coordination needs related to financial constraints and the need for assistance with basic needs for health and well being  in a patient with CKD Chronic Disease Management support and education needs related to effective management of CKD Lacks caregiver support.  Corporate treasurer.   Planned Interventions: Assessed the patients wife, Keith Jenkins  understanding of chronic kidney disease. The patient states he is doing very well right now. Denies any acute changes in his kidney function. Is stable with his CKD at this time.    Evaluation of current treatment plan related to chronic kidney disease self management and patient's adherence to plan as established by provider. The patient is compliant with the plan of care. Sees providers on a regular basis.  Next appointment is 01-12-2023 with the pcp Provided education to patient re: stroke prevention, s/s of heart attack and stroke    Reviewed prescribed diet heart healthy. The patient is compliant with dietary restrictions. States that he has a good appetite. Is eating a product called "luna life" a broth that has 10  grams of protein in it. Education on monitoring sodium content of foods like broth. The patient has had an improvement in his appetite and eating habits since having his procedure recently. States he can swallow a lot better now. The patient gets MOW delivered and is eating much better now. Denies any new issues with compliance with diet. Reviewed medications with patient and discussed importance of compliance. The patient is compliant with medications and works with the pharm D on a regular basis for help with medications cost.     Advised patient, providing education and rationale, to monitor blood pressure daily and record, calling PCP for findings outside established parameters    Discussed complications of poorly controlled blood pressure such as heart disease, stroke, circulatory complications, vision complications, kidney impairment, sexual dysfunction    Reviewed scheduled/upcoming provider appointments including: 01-12-2023 at 1020 am.    Advised patient to discuss changes in kidney function with provider    Discussed plans with patient for ongoing care management follow up and provided patient with direct contact information for care management team    Screening for signs and symptoms of depression related to chronic disease state      Discussed the impact of chronic kidney disease on daily life and mental health and acknowledged and normalized feelings of disempowerment, fear, and frustration    Assessed social determinant of health barriers    Provided education on kidney disease progression    Engage patient in early, proactive and ongoing discussion about  goals of care and what matters most to them    Support coping and stress management by recognizing current strategies and assist in developing new strategies such as mindfulness, journaling, relaxation techniques, problem-solving     Symptom Management: Take medications as prescribed   Attend all scheduled provider appointments Call  provider office for new concerns or questions  call the Suicide and Crisis Lifeline: 988 call the [place] National Suicide Prevention Lifeline: 417 755 1735 or TTY: (705)459-4130 TTY 872-410-6738) to talk to a trained counselor call 1-800-273-TALK (toll free, 24 hour hotline) if experiencing a Mental Health or Behavioral Health Crisis   Follow Up Plan: Telephone follow up appointment with care management team member scheduled for: 01-21-2023 at 1145 am       CCM Expected Outcome:  Monitor, Self-Manage and Reduce Symptoms of Heart Failure       Current Barriers:  Knowledge Deficits related to the importance of monitoring for sx and sx of heart failure and to prevent exacerbations Care Coordination needs related to resources needed to maintain health and well being  in a patient with CHF and other chronic conditions Chronic Disease Management support and education needs related to CHF Lacks caregiver support.  Corporate treasurer.   Wt Readings from Last 3 Encounters:  09/12/22 188 lb (85.3 kg)  07/21/22 187 lb 6.4 oz (85 kg)  06/19/22 205 lb 0.4 oz (93 kg)     Planned Interventions: Basic overview and discussion of pathophysiology of Heart Failure reviewed. The patient denies any issues with swelling or edema. The patients wife states they have to get a new set of scales. The patients wife states that his weight is stays between 179-181. Provided education on low sodium diet. Review and education provided about watching hidden sodium in foods. Education on reading labels. Is getting MOW and is thankful for this.  Continues to get MOW. Education and support given. Reviewed Heart Failure Action Plan in depth and provided written copy. Sees specialist on a regular basis. Assessed need for readable accurate scales in home. The wife is looking into getting a new scales because the current scales they have is not accurate.  Provided education about placing scale on hard, flat surface Advised  patient to weigh each morning after emptying bladder.  Discussed importance of daily weight and advised patient to weigh and record daily. The patients wife states he is at his dry weight of 179-181.  Reviewed role of diuretics in prevention of fluid overload and management of heart failure. The patient is compliant with his medications Discussed the importance of keeping all appointments with provider. Next follow up with the pcp 01-12-2023 at 1020 am New referral made to community resources care guide team for assistance with resources in his area to help with healthy food options, and help with financial burdens. The patient continues to work with the patient and his wife for resources in the area to help with expressed needs.   Advised patient to discuss changes in edema, swelling, or other questions and concerns related to Heart Health with provider Screening for signs and symptoms of depression related to chronic disease state  Assessed social determinant of health barriers The patient is sleeping better at night. He states last night was not a good night but that he is sleeping much better and feeling much better.   Symptom Management: Take medications as prescribed   Attend all scheduled provider appointments Call provider office for new concerns or questions  Work with the social worker to address  care coordination needs and will continue to work with the clinical team to address health care and disease management related needs call the Suicide and Crisis Lifeline: 988 call the [place] National Suicide Prevention Lifeline: (734)018-5731 or TTY: 417 829 1924 TTY (604) 154-8871) to talk to a trained counselor call 1-800-273-TALK (toll free, 24 hour hotline) if experiencing a Mental Health or Behavioral Health Crisis  call office if I gain more than 2 pounds in one day or 5 pounds in one week keep legs up while sitting track weight in diary use salt in moderation watch for swelling in feet,  ankles and legs every day weigh myself daily begin a heart failure diary bring diary to all appointments develop a rescue plan follow rescue plan if symptoms flare-up track symptoms and what helps feel better or worse dress right for the weather, hot or cold  Follow Up Plan: Telephone follow up appointment with care management team member scheduled for: 01-12-2023 at 1145 am       CCM Expected Outcome:  Monitor, Self-Manage, and Reduce Symptoms of Hypertension       Current Barriers:  Knowledge Deficits related to the importance of normalized blood pressures and monitoring for changes in blood pressures in a patient with HTN and also hypotension at times  Care Coordination needs related to financial needs and resources  in a patient with HTN and other chronic conditions Chronic Disease Management support and education needs related to effective management of HTN Lacks caregiver support.  Corporate treasurer.   BP Readings from Last 3 Encounters:  09/12/22 100/68  07/21/22 112/70  06/19/22 95/71  Planned Interventions: Evaluation of current treatment plan related to hypertension self management and patient's adherence to plan as established by provider. The patients blood pressures have been good. Review of changing position slowly to prevent orthostatic hypotension. Review of safety and falls prevention. The patient denies any issues with dizziness or light headedness recently;   Provided education to patient re: stroke prevention, s/s of heart attack and stroke; Reviewed prescribed diet heart healthy. The patient is eating better since having his procedure and he can swallow better, education and support given. Reviewed medications with patient and discussed importance of compliance. States compliance with medications;  Discussed plans with patient for ongoing care management follow up and provided patient with direct contact information for care management team; Advised patient,  providing education and rationale, to monitor blood pressure daily and record, calling PCP for findings outside established parameters. The patient and wife state the patient are doing very well currently. Review of monitoring for falls and safety concerns and light headedness or dizziness. The patient has a history of frequent falls Reviewed scheduled/upcoming provider appointments including: 01-12-2023 at 1020 am with pcp Advised patient to discuss changes in HTN or heart health with provider; Provided education on prescribed diet hear healthy diet,The patient is getting MOW and other meals that are helping out a lot. The SW is working with the patient and his wife and got a grant to help with Clinical biochemist. She is also working with them on getting repairs to their bathroom. Reflective listening as the patients wife has shingles now and she is not feeling well. Education on monitoring for changes and the contagious stage of shingles so the patient will be protected. The patients wife states that she has been sick and she is getting the paperwork together. She is so thankful for all the support of the team and helping them meet the patients needs. Will continue to  monitor for changes.  Discussed complications of poorly controlled blood pressure such as heart disease, stroke, circulatory complications, vision complications, kidney impairment, sexual dysfunction;  Screening for signs and symptoms of depression related to chronic disease state;  Assessed social determinant of health barriers;   Symptom Management: Take medications as prescribed   Attend all scheduled provider appointments Call provider office for new concerns or questions  Work with the social worker to address care coordination needs and will continue to work with the clinical team to address health care and disease management related needs call the Suicide and Crisis Lifeline: 988 call the [place] National Suicide Prevention  Lifeline: 786-719-9090 or TTY: 3677214856 TTY 306-113-6328) to talk to a trained counselor call 1-800-273-TALK (toll free, 24 hour hotline) if experiencing a Mental Health or Behavioral Health Crisis  check blood pressure 3 times per week write blood pressure results in a log or diary learn about high blood pressure keep a blood pressure log take blood pressure log to all doctor appointments call doctor for signs and symptoms of high blood pressure develop an action plan for high blood pressure keep all doctor appointments take medications for blood pressure exactly as prescribed report new symptoms to your doctor  Follow Up Plan: Telephone follow up appointment with care management team member scheduled for: 01-21-2023 at 1145 am          Plan:Telephone follow up appointment with care management team member scheduled for:  01-21-2023 at 1145 am  Alto Denver RN, MSN, CCM RN Care Manager  Chronic Care Management Direct Number: [phone]

## 2022-11-12 NOTE — Patient Instructions (Signed)
Please call the care guide team at [phone] if you need to cancel or reschedule your appointment.   If you are experiencing a Mental Health or Behavioral Health Crisis or need someone to talk to, please call the Suicide and Crisis Lifeline: 988 call the [place] National Suicide Prevention Lifeline: (754)762-9683 or TTY: 828 345 2030 TTY 731-646-8499) to talk to a trained counselor call 1-800-273-TALK (toll free, 24 hour hotline) go to [facility] [address] [place] [phone])   Following is a copy of the CCM Program Consent:  CCM service includes personalized support from designated clinical staff supervised by the physician, including individualized plan of care and coordination with other care providers 24/7 contact phone numbers for assistance for urgent and routine care needs. Service will only be billed when office clinical staff spend 20 minutes or more in a month to coordinate care. Only one practitioner may furnish and bill the service in a calendar month. The patient may stop CCM services at amy time (effective at the end of the month) by phone call to the office staff. The patient will be responsible for cost sharing (co-pay) or up to 20% of the service fee (after annual deductible is met)  Following is a copy of your full provider care plan:   Goals Addressed             This Visit's Progress    CCM (KIDNEY FAILURE)  EXPECTED OUTCOME:  MONITOR, SELF-MANAGE AND REDUCE SYMPTOMS OF KIDNEY FAILURE       Current Barriers:  Knowledge Deficits related to the importance of normalized blood pressures, compliance with medications, and monitoring for changes in the patients urinary and kidney function Care Coordination needs related to financial constraints and the need for assistance with basic needs for health and well being  in a patient with CKD Chronic Disease Management support and education needs related to effective management of  CKD Lacks caregiver support.  Corporate treasurer.   Planned Interventions: Assessed the patients wife, Bonita Quin  understanding of chronic kidney disease. The patient states he is doing very well right now. Denies any acute changes in his kidney function. Is stable with his CKD at this time.    Evaluation of current treatment plan related to chronic kidney disease self management and patient's adherence to plan as established by provider. The patient is compliant with the plan of care. Sees providers on a regular basis.  Next appointment is 01-12-2023 with the pcp Provided education to patient re: stroke prevention, s/s of heart attack and stroke    Reviewed prescribed diet heart healthy. The patient is compliant with dietary restrictions. States that he has a good appetite. Is eating a product called "luna life" a broth that has 10 grams of protein in it. Education on monitoring sodium content of foods like broth. The patient has had an improvement in his appetite and eating habits since having his procedure recently. States he can swallow a lot better now. The patient gets MOW delivered and is eating much better now. Denies any new issues with compliance with diet. Reviewed medications with patient and discussed importance of compliance. The patient is compliant with medications and works with the pharm D on a regular basis for help with medications cost.     Advised patient, providing education and rationale, to monitor blood pressure daily and record, calling PCP for findings outside established parameters    Discussed complications of poorly controlled blood pressure such as heart disease, stroke, circulatory complications,  vision complications, kidney impairment, sexual dysfunction    Reviewed scheduled/upcoming provider appointments including: 01-12-2023 at 1020 am.    Advised patient to discuss changes in kidney function with provider    Discussed plans with patient for ongoing care management  follow up and provided patient with direct contact information for care management team    Screening for signs and symptoms of depression related to chronic disease state      Discussed the impact of chronic kidney disease on daily life and mental health and acknowledged and normalized feelings of disempowerment, fear, and frustration    Assessed social determinant of health barriers    Provided education on kidney disease progression    Engage patient in early, proactive and ongoing discussion about goals of care and what matters most to them    Support coping and stress management by recognizing current strategies and assist in developing new strategies such as mindfulness, journaling, relaxation techniques, problem-solving     Symptom Management: Take medications as prescribed   Attend all scheduled provider appointments Call provider office for new concerns or questions  call the Suicide and Crisis Lifeline: 988 call the [place] National Suicide Prevention Lifeline: 9363836722 or TTY: 763-098-6184 TTY (367)649-3303) to talk to a trained counselor call 1-800-273-TALK (toll free, 24 hour hotline) if experiencing a Mental Health or Behavioral Health Crisis   Follow Up Plan: Telephone follow up appointment with care management team member scheduled for: 01-21-2023 at 1145 am       CCM Expected Outcome:  Monitor, Self-Manage and Reduce Symptoms of Heart Failure       Current Barriers:  Knowledge Deficits related to the importance of monitoring for sx and sx of heart failure and to prevent exacerbations Care Coordination needs related to resources needed to maintain health and well being  in a patient with CHF and other chronic conditions Chronic Disease Management support and education needs related to CHF Lacks caregiver support.  Corporate treasurer.   Wt Readings from Last 3 Encounters:  09/12/22 188 lb (85.3 kg)  07/21/22 187 lb 6.4 oz (85 kg)  06/19/22 205 lb 0.4 oz (93 kg)      Planned Interventions: Basic overview and discussion of pathophysiology of Heart Failure reviewed. The patient denies any issues with swelling or edema. The patients wife states they have to get a new set of scales. The patients wife states that his weight is stays between 179-181. Provided education on low sodium diet. Review and education provided about watching hidden sodium in foods. Education on reading labels. Is getting MOW and is thankful for this.  Continues to get MOW. Education and support given. Reviewed Heart Failure Action Plan in depth and provided written copy. Sees specialist on a regular basis. Assessed need for readable accurate scales in home. The wife is looking into getting a new scales because the current scales they have is not accurate.  Provided education about placing scale on hard, flat surface Advised patient to weigh each morning after emptying bladder.  Discussed importance of daily weight and advised patient to weigh and record daily. The patients wife states he is at his dry weight of 179-181.  Reviewed role of diuretics in prevention of fluid overload and management of heart failure. The patient is compliant with his medications Discussed the importance of keeping all appointments with provider. Next follow up with the pcp 01-12-2023 at 1020 am New referral made to community resources care guide team for assistance with resources in his area to help  with healthy food options, and help with financial burdens. The patient continues to work with the patient and his wife for resources in the area to help with expressed needs.   Advised patient to discuss changes in edema, swelling, or other questions and concerns related to Heart Health with provider Screening for signs and symptoms of depression related to chronic disease state  Assessed social determinant of health barriers The patient is sleeping better at night. He states last night was not a good night but that he  is sleeping much better and feeling much better.   Symptom Management: Take medications as prescribed   Attend all scheduled provider appointments Call provider office for new concerns or questions  Work with the social worker to address care coordination needs and will continue to work with the clinical team to address health care and disease management related needs call the Suicide and Crisis Lifeline: 988 call the [place] National Suicide Prevention Lifeline: 786-634-9275 or TTY: 812-112-6966 TTY 437-581-9688) to talk to a trained counselor call 1-800-273-TALK (toll free, 24 hour hotline) if experiencing a Mental Health or Behavioral Health Crisis  call office if I gain more than 2 pounds in one day or 5 pounds in one week keep legs up while sitting track weight in diary use salt in moderation watch for swelling in feet, ankles and legs every day weigh myself daily begin a heart failure diary bring diary to all appointments develop a rescue plan follow rescue plan if symptoms flare-up track symptoms and what helps feel better or worse dress right for the weather, hot or cold  Follow Up Plan: Telephone follow up appointment with care management team member scheduled for: 01-12-2023 at 1145 am       CCM Expected Outcome:  Monitor, Self-Manage, and Reduce Symptoms of Hypertension       Current Barriers:  Knowledge Deficits related to the importance of normalized blood pressures and monitoring for changes in blood pressures in a patient with HTN and also hypotension at times  Care Coordination needs related to financial needs and resources  in a patient with HTN and other chronic conditions Chronic Disease Management support and education needs related to effective management of HTN Lacks caregiver support.  Corporate treasurer.   BP Readings from Last 3 Encounters:  09/12/22 100/68  07/21/22 112/70  06/19/22 95/71  Planned Interventions: Evaluation of current treatment plan  related to hypertension self management and patient's adherence to plan as established by provider. The patients blood pressures have been good. Review of changing position slowly to prevent orthostatic hypotension. Review of safety and falls prevention. The patient denies any issues with dizziness or light headedness recently;   Provided education to patient re: stroke prevention, s/s of heart attack and stroke; Reviewed prescribed diet heart healthy. The patient is eating better since having his procedure and he can swallow better, education and support given. Reviewed medications with patient and discussed importance of compliance. States compliance with medications;  Discussed plans with patient for ongoing care management follow up and provided patient with direct contact information for care management team; Advised patient, providing education and rationale, to monitor blood pressure daily and record, calling PCP for findings outside established parameters. The patient and wife state the patient are doing very well currently. Review of monitoring for falls and safety concerns and light headedness or dizziness. The patient has a history of frequent falls Reviewed scheduled/upcoming provider appointments including: 01-12-2023 at 1020 am with pcp Advised patient to discuss changes  in HTN or heart health with provider; Provided education on prescribed diet hear healthy diet,The patient is getting MOW and other meals that are helping out a lot. The SW is working with the patient and his wife and got a grant to help with Clinical biochemist. She is also working with them on getting repairs to their bathroom. Reflective listening as the patients wife has shingles now and she is not feeling well. Education on monitoring for changes and the contagious stage of shingles so the patient will be protected. The patients wife states that she has been sick and she is getting the paperwork together. She is so  thankful for all the support of the team and helping them meet the patients needs. Will continue to monitor for changes.  Discussed complications of poorly controlled blood pressure such as heart disease, stroke, circulatory complications, vision complications, kidney impairment, sexual dysfunction;  Screening for signs and symptoms of depression related to chronic disease state;  Assessed social determinant of health barriers;   Symptom Management: Take medications as prescribed   Attend all scheduled provider appointments Call provider office for new concerns or questions  Work with the social worker to address care coordination needs and will continue to work with the clinical team to address health care and disease management related needs call the Suicide and Crisis Lifeline: 988 call the [place] National Suicide Prevention Lifeline: (701)467-5441 or TTY: 9178789722 TTY (440) 797-0668) to talk to a trained counselor call 1-800-273-TALK (toll free, 24 hour hotline) if experiencing a Mental Health or Behavioral Health Crisis  check blood pressure 3 times per week write blood pressure results in a log or diary learn about high blood pressure keep a blood pressure log take blood pressure log to all doctor appointments call doctor for signs and symptoms of high blood pressure develop an action plan for high blood pressure keep all doctor appointments take medications for blood pressure exactly as prescribed report new symptoms to your doctor  Follow Up Plan: Telephone follow up appointment with care management team member scheduled for: 01-21-2023 at 1145 am          Patient verbalizes understanding of instructions and care plan provided today and agrees to view in MyChart. Active MyChart status and patient understanding of how to access instructions and care plan via MyChart confirmed with patient.  Telephone follow up appointment with care management team member scheduled for:  01-21-2023 at 1145 am

## 2022-11-13 ENCOUNTER — Ambulatory Visit (INDEPENDENT_AMBULATORY_CARE_PROVIDER_SITE_OTHER): Payer: Medicare HMO

## 2022-11-13 DIAGNOSIS — J449 Chronic obstructive pulmonary disease, unspecified: Secondary | ICD-10-CM | POA: Diagnosis not present

## 2022-11-13 DIAGNOSIS — I5022 Chronic systolic (congestive) heart failure: Secondary | ICD-10-CM | POA: Diagnosis not present

## 2022-11-13 DIAGNOSIS — Z5181 Encounter for therapeutic drug level monitoring: Secondary | ICD-10-CM

## 2022-11-13 DIAGNOSIS — E785 Hyperlipidemia, unspecified: Secondary | ICD-10-CM | POA: Diagnosis not present

## 2022-11-13 DIAGNOSIS — Z951 Presence of aortocoronary bypass graft: Secondary | ICD-10-CM | POA: Diagnosis not present

## 2022-11-13 DIAGNOSIS — Z7982 Long term (current) use of aspirin: Secondary | ICD-10-CM | POA: Diagnosis not present

## 2022-11-13 DIAGNOSIS — I480 Paroxysmal atrial fibrillation: Secondary | ICD-10-CM | POA: Diagnosis not present

## 2022-11-13 DIAGNOSIS — K219 Gastro-esophageal reflux disease without esophagitis: Secondary | ICD-10-CM | POA: Diagnosis not present

## 2022-11-13 DIAGNOSIS — N1832 Chronic kidney disease, stage 3b: Secondary | ICD-10-CM | POA: Diagnosis not present

## 2022-11-13 DIAGNOSIS — R1319 Other dysphagia: Secondary | ICD-10-CM | POA: Diagnosis not present

## 2022-11-13 DIAGNOSIS — K222 Esophageal obstruction: Secondary | ICD-10-CM | POA: Diagnosis not present

## 2022-11-13 DIAGNOSIS — D509 Iron deficiency anemia, unspecified: Secondary | ICD-10-CM | POA: Diagnosis not present

## 2022-11-13 DIAGNOSIS — I252 Old myocardial infarction: Secondary | ICD-10-CM | POA: Diagnosis not present

## 2022-11-13 DIAGNOSIS — Z9181 History of falling: Secondary | ICD-10-CM | POA: Diagnosis not present

## 2022-11-13 DIAGNOSIS — M1990 Unspecified osteoarthritis, unspecified site: Secondary | ICD-10-CM | POA: Diagnosis not present

## 2022-11-13 DIAGNOSIS — Z87891 Personal history of nicotine dependence: Secondary | ICD-10-CM | POA: Diagnosis not present

## 2022-11-13 DIAGNOSIS — I350 Nonrheumatic aortic (valve) stenosis: Secondary | ICD-10-CM | POA: Diagnosis not present

## 2022-11-13 DIAGNOSIS — Z7901 Long term (current) use of anticoagulants: Secondary | ICD-10-CM | POA: Diagnosis not present

## 2022-11-13 DIAGNOSIS — H409 Unspecified glaucoma: Secondary | ICD-10-CM | POA: Diagnosis not present

## 2022-11-13 DIAGNOSIS — I2510 Atherosclerotic heart disease of native coronary artery without angina pectoris: Secondary | ICD-10-CM | POA: Diagnosis not present

## 2022-11-13 DIAGNOSIS — Z954 Presence of other heart-valve replacement: Secondary | ICD-10-CM | POA: Diagnosis not present

## 2022-11-13 LAB — POCT INR: INR: 2.5 (ref 2.0–3.0)

## 2022-11-13 NOTE — Patient Instructions (Signed)
Description   Spoke with [name], RN [facility] and instructed for pt to continue taking warfarin 3 tablets daily (3mg ).  Home Health will be discontinued next week - scheduled appt for Coumadin Clinic in 5 weeks in [place]. Called and spoke with pt's wife to confirm upcoming appt.  Recheck INR in 5 weeks.  Coumadin Clinic [phone] Fax Clearance form to [phone]

## 2022-11-18 ENCOUNTER — Telehealth (HOSPITAL_COMMUNITY): Payer: Self-pay | Admitting: Cardiology

## 2022-11-18 ENCOUNTER — Telehealth: Payer: Self-pay

## 2022-11-18 DIAGNOSIS — I5022 Chronic systolic (congestive) heart failure: Secondary | ICD-10-CM | POA: Diagnosis not present

## 2022-11-18 DIAGNOSIS — K219 Gastro-esophageal reflux disease without esophagitis: Secondary | ICD-10-CM | POA: Diagnosis not present

## 2022-11-18 DIAGNOSIS — Z954 Presence of other heart-valve replacement: Secondary | ICD-10-CM | POA: Diagnosis not present

## 2022-11-18 DIAGNOSIS — I252 Old myocardial infarction: Secondary | ICD-10-CM | POA: Diagnosis not present

## 2022-11-18 DIAGNOSIS — R1319 Other dysphagia: Secondary | ICD-10-CM | POA: Diagnosis not present

## 2022-11-18 DIAGNOSIS — N1832 Chronic kidney disease, stage 3b: Secondary | ICD-10-CM | POA: Diagnosis not present

## 2022-11-18 DIAGNOSIS — M1990 Unspecified osteoarthritis, unspecified site: Secondary | ICD-10-CM | POA: Diagnosis not present

## 2022-11-18 DIAGNOSIS — E785 Hyperlipidemia, unspecified: Secondary | ICD-10-CM | POA: Diagnosis not present

## 2022-11-18 DIAGNOSIS — Z7982 Long term (current) use of aspirin: Secondary | ICD-10-CM | POA: Diagnosis not present

## 2022-11-18 DIAGNOSIS — Z87891 Personal history of nicotine dependence: Secondary | ICD-10-CM | POA: Diagnosis not present

## 2022-11-18 DIAGNOSIS — Z9181 History of falling: Secondary | ICD-10-CM | POA: Diagnosis not present

## 2022-11-18 DIAGNOSIS — I2510 Atherosclerotic heart disease of native coronary artery without angina pectoris: Secondary | ICD-10-CM | POA: Diagnosis not present

## 2022-11-18 DIAGNOSIS — D509 Iron deficiency anemia, unspecified: Secondary | ICD-10-CM | POA: Diagnosis not present

## 2022-11-18 DIAGNOSIS — J449 Chronic obstructive pulmonary disease, unspecified: Secondary | ICD-10-CM | POA: Diagnosis not present

## 2022-11-18 DIAGNOSIS — I480 Paroxysmal atrial fibrillation: Secondary | ICD-10-CM | POA: Diagnosis not present

## 2022-11-18 DIAGNOSIS — I350 Nonrheumatic aortic (valve) stenosis: Secondary | ICD-10-CM | POA: Diagnosis not present

## 2022-11-18 DIAGNOSIS — Z951 Presence of aortocoronary bypass graft: Secondary | ICD-10-CM | POA: Diagnosis not present

## 2022-11-18 DIAGNOSIS — H409 Unspecified glaucoma: Secondary | ICD-10-CM | POA: Diagnosis not present

## 2022-11-18 DIAGNOSIS — K222 Esophageal obstruction: Secondary | ICD-10-CM | POA: Diagnosis not present

## 2022-11-18 DIAGNOSIS — Z7901 Long term (current) use of anticoagulants: Secondary | ICD-10-CM | POA: Diagnosis not present

## 2022-11-18 MED ORDER — ASPIRIN 81 MG PO TBEC: 81.0000 mg | DELAYED_RELEASE_TABLET | Freq: Every day | ORAL | 1 refills | Status: DC

## 2022-11-18 NOTE — Telephone Encounter (Signed)
Stephaine,RN with HH called during pts home visit to report   Diminished lung sound L>R 3lb weight gain overnight (183lb) Increased SOB at night     Please advise if changes are needed

## 2022-11-18 NOTE — Progress Notes (Cosign Needed)
Care Management & Coordination Services Pharmacy Team  Reason for Encounter: Medication coordination and delivery  Contacted patient to discuss medications and coordinate delivery from [facility] pharmacy. Spoke with family on 11/18/2022  Cycle dispensing form sent to [employer] for review.   Last adherence delivery date: 10-31-2022    Patient is due for next adherence delivery on: 12-01-2022  This delivery to include: Vials  30 Days  Amiodarone 100 mg daily Atorvastatin 80 mg at Bedtime Digoxin 0.125 mg half tablet daily Lexapro 10 mg daily Zetia 10 mg half tablet daily Gabapentin 600 mg at Bedtime Potassium chloride 20 meq 2 tablets daily Spironolactone 25 mg daily Warfarin 1 mg 2-3 tabs daily as directed  Torsemide 20 mg 4 tablets daily  Aspirin 81 mg daily   Patient declined the following medications this month: Midodrine- PAP Proventil- Plenty supply Ventolin- plenty supply Farxiga- PAP  No refill request needed.  Confirmed delivery date of 12-01-2022, advised patient that pharmacy will contact them the morning of delivery.  Any concerns about your medications? No  How often do you forget or accidentally miss a dose? Never  Do you use a pillbox? Yes  Is patient in packaging No  Recent blood pressure readings are as follows: 108/64 62 form nurse visit on 11-17-2022  Recent blood glucose readings are as follows: None available   Chart review: Recent office visits:  11-12-2022 Marlowe Sax, RN (CCM).  11-05-2022 Bevelyn Ngo [facility]).  Recent consult visits:  11-13-2022  Memory Dance, RN (Cardiology). Anti-coag visit  Hospital visits:  None in previous 6 months  Medications: Outpatient Encounter Medications as of 11/18/2022  Medication Sig   albuterol (PROVENTIL) (2.5 MG/3ML) 0.083% nebulizer solution Inhale 3 mLs into the lungs every 6 (six) hours as needed for wheezing or shortness of breath.   albuterol (VENTOLIN HFA) 108 (90 Base) MCG/ACT  inhaler Inhale 2 puffs into the lungs every 6 (six) hours as needed for wheezing or shortness of breath.   amiodarone (PACERONE) 200 MG tablet Take 0.5 tablets (100 mg total) by mouth daily.   aspirin EC 81 MG tablet Take 1 tablet (81 mg total) by mouth daily. Swallow whole.   atorvastatin (LIPITOR) 80 MG tablet TAKE ONE TABLET BY MOUTH EVERYDAY AT BEDTIME   cyanocobalamin (VITAMIN B12) 1000 MCG/ML injection Inject 1 mL (1,000 mcg total) into the muscle every 30 (thirty) days.   dapagliflozin propanediol (FARXIGA) 10 MG TABS tablet Take 1 tablet (10 mg total) by mouth daily.   digoxin (LANOXIN) 0.125 MG tablet TAKE 1/2 TABLET BY MOUTH ONCE DAILY   escitalopram (LEXAPRO) 10 MG tablet Take 1 tablet (10 mg total) by mouth daily.   esomeprazole (NEXIUM) 40 MG capsule Take 40 mg by mouth daily.   ezetimibe (ZETIA) 10 MG tablet Take 0.5 tablets (5 mg total) by mouth daily.   Fluticasone-Umeclidin-Vilant (TRELEGY ELLIPTA) 100-62.5-25 MCG/ACT AEPB Inhale 1 puff into the lungs daily. (Patient not taking: Reported on 09/12/2022)   gabapentin (NEURONTIN) 600 MG tablet Take 1 tablet (600 mg total) by mouth at bedtime.   Iron, Ferrous Sulfate, 325 (65 Fe) MG TABS Take 325 mg by mouth daily.   latanoprost (XALATAN) 0.005 % ophthalmic solution Place 1 drop into both eyes at bedtime.   midodrine (PROAMATINE) 10 MG tablet Take 1 tablet (10 mg total) by mouth 3 (three) times daily.   nitroGLYCERIN (NITROSTAT) 0.4 MG SL tablet Place 1 tablet (0.4 mg total) under the tongue every 5 (five) minutes as needed for chest  pain.   OVER THE COUNTER MEDICATION Apply 1 application  topically at bedtime. Medication: Mag Maxx Cream Back and restless leg   potassium chloride SA (KLOR-CON M) 20 MEQ tablet TAKE TWO TABLETS BY MOUTH ONCE DAILY   spironolactone (ALDACTONE) 25 MG tablet Take 1 tablet (25 mg total) by mouth daily.   torsemide (DEMADEX) 20 MG tablet Take 4 tablets (80 mg total) by mouth daily.   warfarin (COUMADIN) 1 MG  tablet Take 2-3 tablets BY MOUTH daily AS DIRECTED by coumadin clinic   No facility-administered encounter medications on file as of 11/18/2022.   BP Readings from Last 3 Encounters:  09/12/22 100/68  07/21/22 112/70  06/19/22 95/71    Pulse Readings from Last 3 Encounters:  09/12/22 60  07/21/22 65  06/19/22 (!) 58    Lab Results  Component Value Date/Time   HGBA1C 6.3 (H) 09/12/2022 11:25 AM   HGBA1C 6.0 (H) 03/18/2022 05:58 AM   Lab Results  Component Value Date   CREATININE 2.17 (H) 09/12/2022   BUN 39 (H) 09/12/2022   GFRNONAA 29 (L) 08/08/2022   GFRAA 65 03/27/2020   NA 135 09/12/2022   K 3.8 09/12/2022   CALCIUM 9.2 09/12/2022   CO2 24 09/12/2022     Malecca Hicks CMA Clinical Pharmacist Assistant [phone]

## 2022-11-18 NOTE — Telephone Encounter (Signed)
Detailed message left for [name] [phone]) and pts daughter aware

## 2022-11-26 ENCOUNTER — Encounter (HOSPITAL_COMMUNITY): Payer: Self-pay | Admitting: Internal Medicine

## 2022-11-27 NOTE — Telephone Encounter (Signed)
  Please continue Torsemide 80 mg in am and 40 mg in pm. Plan to check BMEt next week.   Limit high sodium foods and limit fluid intake to < 2 liters per day.   If no improvement call back next week.   Naveya Ellerman NP-C  12:39 PM

## 2022-11-28 ENCOUNTER — Other Ambulatory Visit (HOSPITAL_COMMUNITY): Payer: Self-pay

## 2022-11-28 MED ORDER — MIDODRINE HCL 10 MG PO TABS: 10.0000 mg | ORAL_TABLET | Freq: Three times a day (TID) | ORAL | 3 refills | Status: DC

## 2022-11-28 NOTE — Telephone Encounter (Signed)
Meds ordered this encounter  Medications   midodrine (PROAMATINE) 10 MG tablet    Sig: Take 1 tablet (10 mg total) by mouth 3 (three) times daily.    Dispense:  270 tablet    Refill:  3

## 2022-12-03 ENCOUNTER — Encounter: Payer: Self-pay | Admitting: Pharmacist

## 2022-12-03 NOTE — Progress Notes (Signed)
Patient previously followed by [facility] pharmacist. Per clinical review, no pharmacist appointment needed at this time. Will make pharmacy patient advocate team aware of medication assistance for Farxiga and Trelegy for renewal in 2025. Care guide directed to contact patient and cancel appointment and notify pharmacy team of any patient concerns.

## 2022-12-07 DIAGNOSIS — I110 Hypertensive heart disease with heart failure: Secondary | ICD-10-CM

## 2022-12-07 DIAGNOSIS — I5020 Unspecified systolic (congestive) heart failure: Secondary | ICD-10-CM | POA: Diagnosis not present

## 2022-12-17 ENCOUNTER — Ambulatory Visit (HOSPITAL_COMMUNITY)
Admission: RE | Admit: 2022-12-17 | Discharge: 2022-12-17 | Disposition: A | Discharge status: Home / Self Care | Payer: Medicare HMO | Source: Ambulatory Visit | Attending: Cardiology | Admitting: Cardiology

## 2022-12-17 ENCOUNTER — Encounter (HOSPITAL_COMMUNITY): Payer: Self-pay

## 2022-12-17 ENCOUNTER — Telehealth: Payer: Self-pay

## 2022-12-17 ENCOUNTER — Encounter (HOSPITAL_COMMUNITY): Payer: Self-pay | Admitting: Internal Medicine

## 2022-12-17 VITALS — BP 116/68 | HR 53 | Wt 198.6 lb

## 2022-12-17 DIAGNOSIS — I340 Nonrheumatic mitral (valve) insufficiency: Secondary | ICD-10-CM | POA: Insufficient documentation

## 2022-12-17 DIAGNOSIS — I252 Old myocardial infarction: Secondary | ICD-10-CM | POA: Diagnosis not present

## 2022-12-17 DIAGNOSIS — Z79899 Other long term (current) drug therapy: Secondary | ICD-10-CM | POA: Insufficient documentation

## 2022-12-17 DIAGNOSIS — I480 Paroxysmal atrial fibrillation: Secondary | ICD-10-CM | POA: Insufficient documentation

## 2022-12-17 DIAGNOSIS — E785 Hyperlipidemia, unspecified: Secondary | ICD-10-CM | POA: Diagnosis not present

## 2022-12-17 DIAGNOSIS — K219 Gastro-esophageal reflux disease without esophagitis: Secondary | ICD-10-CM | POA: Diagnosis not present

## 2022-12-17 DIAGNOSIS — J449 Chronic obstructive pulmonary disease, unspecified: Secondary | ICD-10-CM | POA: Insufficient documentation

## 2022-12-17 DIAGNOSIS — I2510 Atherosclerotic heart disease of native coronary artery without angina pectoris: Secondary | ICD-10-CM | POA: Diagnosis not present

## 2022-12-17 DIAGNOSIS — Z953 Presence of xenogenic heart valve: Secondary | ICD-10-CM | POA: Diagnosis not present

## 2022-12-17 DIAGNOSIS — Z5941 Food insecurity: Secondary | ICD-10-CM | POA: Insufficient documentation

## 2022-12-17 DIAGNOSIS — Z951 Presence of aortocoronary bypass graft: Secondary | ICD-10-CM | POA: Insufficient documentation

## 2022-12-17 DIAGNOSIS — Z7901 Long term (current) use of anticoagulants: Secondary | ICD-10-CM | POA: Diagnosis not present

## 2022-12-17 DIAGNOSIS — R7303 Prediabetes: Secondary | ICD-10-CM | POA: Diagnosis not present

## 2022-12-17 DIAGNOSIS — I5022 Chronic systolic (congestive) heart failure: Secondary | ICD-10-CM | POA: Diagnosis not present

## 2022-12-17 DIAGNOSIS — I350 Nonrheumatic aortic (valve) stenosis: Secondary | ICD-10-CM | POA: Diagnosis not present

## 2022-12-17 DIAGNOSIS — Z8249 Family history of ischemic heart disease and other diseases of the circulatory system: Secondary | ICD-10-CM | POA: Diagnosis not present

## 2022-12-17 DIAGNOSIS — Z5982 Transportation insecurity: Secondary | ICD-10-CM | POA: Insufficient documentation

## 2022-12-17 DIAGNOSIS — Z5986 Financial insecurity: Secondary | ICD-10-CM | POA: Insufficient documentation

## 2022-12-17 DIAGNOSIS — I5023 Acute on chronic systolic (congestive) heart failure: Secondary | ICD-10-CM | POA: Insufficient documentation

## 2022-12-17 LAB — BASIC METABOLIC PANEL
Anion gap: 10 (ref 5–15)
BUN: 36 mg/dL — ABNORMAL HIGH (ref 8–23)
CO2: 26 mmol/L (ref 22–32)
Calcium: 8.8 mg/dL — ABNORMAL LOW (ref 8.9–10.3)
Chloride: 96 mmol/L — ABNORMAL LOW (ref 98–111)
Creatinine, Ser: 1.97 mg/dL — ABNORMAL HIGH (ref 0.61–1.24)
GFR, Estimated: 35 mL/min — ABNORMAL LOW (ref >60)
Glucose, Bld: 84 mg/dL (ref 70–99)
Potassium: 4.9 mmol/L (ref 3.5–5.1)
Sodium: 132 mmol/L — ABNORMAL LOW (ref 135–145)

## 2022-12-17 LAB — DIGOXIN LEVEL: Digoxin Level: 0.9 ng/mL (ref 0.8–2.0)

## 2022-12-17 LAB — BRAIN NATRIURETIC PEPTIDE: B Natriuretic Peptide: 1681 pg/mL — ABNORMAL HIGH (ref 0.0–100.0)

## 2022-12-17 NOTE — Patient Instructions (Addendum)
Take Furoscix as directed.  Labs done today, your results will be available in MyChart, we will contact you for abnormal readings.   Your provider has order Furoscix for you. This is an on-body infuser that gives you a dose of Furosemide.   It will be shipped to your home   [facility] will call you to discuss before shipping so, PLEASE answer unknown calls  For questions regarding the device call [facility] at [phone]  Ensure you write down the time you start your infusion so that if there is a problem you will know how long the infusion lasted  Use Furoscix only AS DIRECTED by our office  Dosing Directions:   Day 1= 12/17/22  1 kit  Day 2= 12/18/22 1 kit  Your physician recommends that you schedule a follow-up appointment in: Friday  If you have any questions or concerns before your next appointment please send [place] a message through [place] or call our office at [phone].    TO LEAVE A MESSAGE FOR THE NURSE SELECT OPTION 2, PLEASE LEAVE A MESSAGE INCLUDING: YOUR NAME DATE OF BIRTH CALL BACK NUMBER REASON FOR CALL**this is important as we prioritize the call backs  YOU WILL RECEIVE A CALL BACK THE SAME DAY AS LONG AS YOU CALL BEFORE 4:00 PM  At the [facility], you and your health needs are our priority. As part of our continuing mission to provide you with exceptional heart care, we have created designated Provider Care Teams. These Care Teams include your primary Cardiologist (physician) and Advanced Practice Providers (APPs- Physician Assistants and Nurse Practitioners) who all work together to provide you with the care you need, when you need it.   You may see any of the following providers on your designated Care Team at your next follow up: Dr Arvilla Meres Dr Marca Ancona Dr. Marcos Eke, NP Robbie Lis, [place] [facility] [place], [place] Brynda Peon, NP Karle Plumber, PharmD   Please be sure to  bring in all your medications bottles to every appointment.    Thank you for choosing [facility]

## 2022-12-17 NOTE — Telephone Encounter (Signed)
Contacted Keith Jenkins to schedule their annual wellness visit. I spoke with the patients daughter, she is going to call back to make the appointment once he is done with his cardiology appointment today.  Ok to schedule with dr. Faylene Kurtz or kim

## 2022-12-17 NOTE — Progress Notes (Signed)
Provided patient education on Furoscix using demo kits and Furoscix video, QR code provided on AVS for further viewing. Furoscix order form completed and signed by Robbie Lis  Order form, ins info, & OV note all faxed into [facility].  Medication Samples have been provided to the patient.  Drug name: Furoscix       Strength: 80mg /31ml        Qty: 2  LOT: [id number]  Exp.Date: 02-07-24  Dosing instructions: use 1 kit today 12/17/22 and the other kit tomorrow 12/18/22   The patient has been instructed regarding the correct time, dose, and frequency of taking this medication, including desired effects and most common side effects.   Roselynn Whitacre M Newel Oien 12:40 PM 12/17/2022

## 2022-12-17 NOTE — Progress Notes (Signed)
ReDS Vest / Clip - 12/17/22 1200       ReDS Vest / Clip   Station Marker D    Ruler Value 26    ReDS Value Range High volume overload    ReDS Actual Value 43

## 2022-12-17 NOTE — Progress Notes (Signed)
ADVANCED HF CLINIC NOTE   Primary Care: Blane Ohara, MD HF Cardiologist: Dr. Gala Romney   HPI: Keith Jenkins is a 77 y.o. male with history of COPD, prediabetes, GERD/dysphagia, HLD, and new diagnosis of CAD and systolic heart failure.  Admitted 4/23 with late presenting inferior STEMI. LHC showed total occlusion mRCA and 90% mLAD. RCA managed medically d/t completed infarct.  Plan to optimize medical therapy and arrange LAD intervention at later time. Echo showed EF 20-25%, RWMA, RV moderately reduced, moderate to severe ischemic MR, aortic root 4.3 cm, dilated IVC with estimated RAP 8 mmHg. AHF consulted and GDMT titrated, but limited due to soft blood pressure. CVTS consulted for CABG/MVR, instead of PCI to LAD, due to moderate to severe ischemic MR.    Admitted 5/23 with a/c CHF Echo EF 30-35%, RV moderately reduced, moderate pericardial effusion with no tamponade. Started on milrinone. Developed AFL and amiodarone gtt started. Underwent CABG x 2 (LIMA-LAD, SVG-RAMUS INT) +MVR, MAZE, LAA Clip. Hospitalization c/b distal esophageal stricture seen on barium swallow. GI consulted and plan for outpatient EGD/dilation.  Seen in ED 02/20/22 for fall after becoming light-headed. CT head and neck negative for acute abnormality.  Echo 03/12/22 EF 20-25%, severe LV dysfunction with global HK, + large Left pleural effusion, RV moderately down, moderate aortic stenosis, suspect component of low gradient AS, dimensionless index = 0.31. Reviewed by Dr. Gala Romney. Volume overloaded and NYHA IV at follow up 03/12/22.. Direct admit from clinic for IV diuresis and VAD work up. Diuresed with IV lasix. Underwent RHC showing elevated filling pressures with moderately decreased cardiac output, Fick CO/CI 5.7/2.7 Thermo CO/CI 4.7/2.2. CVTS and Dr. Gala Romney reviewed case and felt patient not a good candidate for VAD. Patient also declined VAD. Underwent bilateral thoracentesis before discharge. Discharged home, weight  172 lbs.  He presents to clinic today as a work-in. Here w/ wife and daughter. Being seen given complaints of worsening exertional dyspnea and orthopnea. No PND. Reports full med compliance but admits to dietary indiscretion w/ high sodium foods. Eating lots of processed meats and soups. Wt up 10 lb since last visit. ReDs 43%. Has tried increasing torsemide from 80 mg once daily to 80 mg qam + 40 mg qpm but c/w volume overload. No resting dyspnea. Denies CP. EKG today shows sinus bradycardia 58 bpm. BP 116/68. No orthostatic symptoms.     Cardiac Studies  - RHC (10/23): RA = 8 RV = 45/10 PA = 48/16 (28) PCW = 22 (v = 30)  Fick cardiac output/index = 5.7/2.7 Thermo CO/CI = 4.7/2.2 PVR = 0.8 WU Ao sat = 94% PA sat = 54%, 57% PAPI = 4.0   - Echo (10/23): EF 20-25%, severe LV dysfunction with global HK, + large Left pleural effusion, RV moderately down, moderate aortic stenosis, suspect component of low gradient AS, dimensionless index = 0.31  - Echo (5/23): EF 25%, RV moderately reduced, moderate to severe MR  - Echo (4/23): EF 20-25%, RWMA, RV moderately reduced, mioderate to severe ischemic MR, aortic root 4.3 cm, dilated IVC with estimated RAP 8 mmHg  - LHC (5/23): 100% m RCA and 90% m LAD. RCA managed medically d/t completed infarct.   ROS: All systems reviewed and negative except as per HPI.   Past Medical History:  Diagnosis Date   Aortic atherosclerosis (HCC)    Arthritis    CHF (congestive heart failure) (HCC)    Esophageal stricture    Glaucoma    MI (myocardial infarction) (HCC)  Non-ST elevation (NSTEMI) myocardial infarction (HCC) 10/10/2021   NSTEMI (non-ST elevated myocardial infarction) (HCC) 10/10/2021   Osteoarthritis    Severe mitral regurgitation    Stricture and stenosis of esophagus    Current Outpatient Medications  Medication Sig Dispense Refill   albuterol (PROVENTIL) (2.5 MG/3ML) 0.083% nebulizer solution Inhale 3 mLs into the lungs every 6 (six)  hours as needed for wheezing or shortness of breath. 90 mL 12   albuterol (VENTOLIN HFA) 108 (90 Base) MCG/ACT inhaler Inhale 2 puffs into the lungs every 6 (six) hours as needed for wheezing or shortness of breath. 8 g 0   amiodarone (PACERONE) 200 MG tablet Take 0.5 tablets (100 mg total) by mouth daily. 45 tablet 1   aspirin EC 81 MG tablet Take 1 tablet (81 mg total) by mouth daily. Swallow whole. 90 tablet 1   atorvastatin (LIPITOR) 80 MG tablet TAKE ONE TABLET BY MOUTH EVERYDAY AT BEDTIME 30 tablet 3   cyanocobalamin (VITAMIN B12) 1000 MCG/ML injection Inject 1 mL (1,000 mcg total) into the muscle every 30 (thirty) days. 1 mL 3   dapagliflozin propanediol (FARXIGA) 10 MG TABS tablet Take 1 tablet (10 mg total) by mouth daily. 90 tablet 1   digoxin (LANOXIN) 0.125 MG tablet TAKE 1/2 TABLET BY MOUTH ONCE DAILY 15 tablet 3   escitalopram (LEXAPRO) 10 MG tablet Take 1 tablet (10 mg total) by mouth daily. 90 tablet 1   esomeprazole (NEXIUM) 40 MG capsule Take 40 mg by mouth daily.     ezetimibe (ZETIA) 10 MG tablet Take 0.5 tablets (5 mg total) by mouth daily. 90 tablet 1   Fluticasone-Umeclidin-Vilant (TRELEGY ELLIPTA) 100-62.5-25 MCG/ACT AEPB Inhale 1 puff into the lungs daily. (Patient taking differently: Inhale 1 puff into the lungs as needed.) 1 each 11   gabapentin (NEURONTIN) 600 MG tablet Take 1 tablet (600 mg total) by mouth at bedtime. 90 tablet 1   Iron, Ferrous Sulfate, 325 (65 Fe) MG TABS Take 325 mg by mouth daily. 30 tablet 1   latanoprost (XALATAN) 0.005 % ophthalmic solution Place 1 drop into both eyes at bedtime.     midodrine (PROAMATINE) 10 MG tablet Take 1 tablet (10 mg total) by mouth 3 (three) times daily. 270 tablet 3   nitroGLYCERIN (NITROSTAT) 0.4 MG SL tablet Place 1 tablet (0.4 mg total) under the tongue every 5 (five) minutes as needed for chest pain. 25 tablet 3   OVER THE COUNTER MEDICATION Apply 1 application  topically at bedtime. Medication: Mag Maxx Cream Back  and restless leg     potassium chloride SA (KLOR-CON M) 20 MEQ tablet TAKE TWO TABLETS BY MOUTH ONCE DAILY (Patient taking differently: daily. Patient takes 2 tablets in the morning and 1 tablet at night.) 30 tablet 6   spironolactone (ALDACTONE) 25 MG tablet Take 1 tablet (25 mg total) by mouth daily. 60 tablet 3   torsemide (DEMADEX) 20 MG tablet Take 4 tablets (80 mg total) by mouth daily. (Patient taking differently: Patient takes 4 tablets in the morning and 2 tablets at nighttime) 360 tablet 1   warfarin (COUMADIN) 1 MG tablet Take 2-3 tablets BY MOUTH daily AS DIRECTED by coumadin clinic 200 tablet 0   No current facility-administered medications for this encounter.   No Known Allergies  Social History   Socioeconomic History   Marital status: Married    Spouse name: Not on file   Number of children: Not on file   Years of education: Not on file  Highest education level: Not on file  Occupational History   Not on file  Tobacco Use   Smoking status: Former    Types: Cigarettes    Quit date: 1970    Years since quitting: 54.5   Smokeless tobacco: Never  Vaping Use   Vaping Use: Never used  Substance and Sexual Activity   Alcohol use: Not Currently   Drug use: Never   Sexual activity: Not Currently    Birth control/protection: None  Other Topics Concern   Not on file  Social History Narrative   Not on file   Social Determinants of Health   Financial Resource Strain: High Risk (08/28/2022)   Overall Financial Resource Strain (CARDIA)    Difficulty of Paying Living Expenses: Very hard  Food Insecurity: Food Insecurity Present (07/04/2022)   Hunger Vital Sign    Worried About Running Out of Food in the Last Year: Often true    Ran Out of Food in the Last Year: Sometimes true  Transportation Needs: Unmet Transportation Needs (08/28/2022)   PRAPARE - Transportation    Lack of Transportation (Medical): Yes    Lack of Transportation (Non-Medical): Yes  Physical Activity:  Sufficiently Active (04/14/2022)   Exercise Vital Sign    Days of Exercise per Week: 5 days    Minutes of Exercise per Session: 30 min  Stress: No Stress Concern Present (04/14/2022)   [employer] of Occupational Health - Occupational Stress Questionnaire    Feeling of Stress : Not at all  Social Connections: Moderately Integrated (04/17/2022)   Social Connection and Isolation Panel [NHANES]    Frequency of Communication with Friends and Family: More than three times a week    Frequency of Social Gatherings with Friends and Family: More than three times a week    Attends Religious Services: More than 4 times per year    Active Member of [employer] or Organizations: No    Attends Banker Meetings: Never    Marital Status: Married  Catering manager Violence: Not At Risk (04/17/2022)   Humiliation, Afraid, Rape, and Kick questionnaire    Fear of Current or Ex-Partner: No    Emotionally Abused: No    Physically Abused: No    Sexually Abused: No   Family History  Problem Relation Age of Onset   Heart attack Mother    Hypertension Mother    Heart attack Father    Hypertension Father    Stomach cancer Sister    Brain cancer Sister    BP 116/68   Pulse (!) 53   Wt 90.1 kg (198 lb 9.6 oz)   SpO2 97%   BMI 26.20 kg/m   Wt Readings from Last 3 Encounters:  12/17/22 90.1 kg (198 lb 9.6 oz)  09/12/22 85.3 kg (188 lb)  07/21/22 85 kg (187 lb 6.4 oz)   PHYSICAL EXAM: ReDs 43%  General:  Well appearing. No respiratory difficulty HEENT: normal Neck: supple. JVD 12 cm. Carotids 2+ bilat; no bruits. No lymphadenopathy or thyromegaly appreciated. Cor: PMI nondisplaced. Regular rate & rhythm. 3/6 SEM loudest RUSB  Lungs: decreased BS at the bases  Abdomen: soft, nontender, +distended. No hepatosplenomegaly. No bruits or masses. Good bowel sounds. Extremities: no cyanosis, clubbing, rash, trace-1+ b/l LE edema Neuro: alert & oriented x 3, cranial nerves grossly intact. moves  all 4 extremities w/o difficulty. Affect pleasant.    ECG sinus bradycardia 58 bpm   ASSESSMENT & PLAN: 1. Acute on Chronic Systolic CHF /ICM - s/p  late presentation inferior STEMI 05/23 - Echo (5/23): EF 25-30%, RV moderately reduced, moderate to severe MR. - S/p CABG + bioprosthetic MVR 11/01/21 - Echo 03/12/22 showed EF 20-25%, severe LV dysfunction with global HK, + large Left pleural effusion, RV moderately down, moderate aortic stenosis, suspect component of low gradient AS. - RHC (10/23): Elevated filling pressures with moderately decreased cardiac output. RA 8, PA 48/16, PCWP 22, CO/CI (Fick) 5.7/2.7. - Felt not to be a suitable VAD candidate (Drs. Gala Romney & Bartle discussed). Patient declined VAD as well. - Will focus on aggressive medical therapy with tight volume control. (Potentially candidate for ADI volume management trial?). - Volume overloaded today in setting of dietary indiscretion w/ sodium. Wt up 10 lb. ReDs 43%. Little improvement despite escalation of home oral diuretic regimen - will treat w/ Furoscix 80 mg this evening followed by BID dosing tomorrow and return f/u in 2 days   - Hold torsemide while taking Furoscix   - Continue digoxin 0.0625 mg daily. Check Digoxin level  - Continue Farxiga 10 mg daily.  - Continue spiro 25 mg daily - no Entresto given renal fx and need for midodrine for BP support  - Continue midodrine 10 mg tid. - no ? blocker given low BP and bradycardia - encouraged fluid and sodium restriction  - will repeat echo to reassess LV function and severity of Aortic valve stenosis   - 2. CAD/ S/p Late Presentation Inferior STEMI - LHC 05/23: 100% m RCA and 90% m LAD.  - RCA managed medically d/t completed infarct.  - Echo (5/23) EF 25%, RV moderately reduced, moderate to severe MR - Echo (10/23): EF 20-25%, RV moderately down. - s/p CABG x 2 (LIMA-LAD, SVG-Ramus INT)  (11/01/21) - stable w/o CP  - Continue ASA + high intensity statin.    3.  Mitral regurgitation - Moderate to severe ischemic MR on TEE.  - S/p bioprosthetic MVR 11/02/21. - On warfarin per TCTS. INR followed by  [facility].  - update echo    4. LFLG Aortic stenosis - Moderate on echo 10/23 - May also have to think about TAVR at some point if he is a candidate. - will repeat echo to reassess severity in light of worsening HF symptoms    5. H/o Pericardial effusion/Acute Pericarditis  - Moderate in size on past echo, w/o tamponade. - Acute pericarditis/ adhensions noted at time of CABG. - Resolved on echo 10/23  - Repeat echo per above    6. PAF - s/p MAZE w/ CABG  - in NSR on EKG today  - Continue amiodarone 100 mg daily.  - Continue warfarin. - INR followed in [facility]in [facility].    f/u in 2 days w/ APP to reassess volume status   Robbie Lis, PA-C  12:16 PM

## 2022-12-18 ENCOUNTER — Other Ambulatory Visit (HOSPITAL_COMMUNITY): Payer: Self-pay | Admitting: Internal Medicine

## 2022-12-18 ENCOUNTER — Telehealth (HOSPITAL_COMMUNITY): Payer: Self-pay

## 2022-12-18 NOTE — Telephone Encounter (Addendum)
Pt aware, agreeable, and verbalized understanding   ----- Message from Robbie Lis sent at 12/17/2022  5:05 PM EDT ----- Labs stable. Will repeat on Friday at return f/u. Digoxin level is mildly elevated. Instruct pt to hold digoxin Friday morning and we will check trough level on Friday

## 2022-12-18 NOTE — Progress Notes (Addendum)
ADVANCED HF CLINIC NOTE   Primary Care: Blane Ohara, MD HF Cardiologist: Dr. Gala Romney   HPI: Keith Jenkins is a 77 y.o. male with history of COPD, prediabetes, GERD/dysphagia, HLD, and new diagnosis of CAD and systolic heart failure.  Admitted 4/23 with late presenting inferior STEMI. LHC showed total occlusion mRCA and 90% mLAD. RCA managed medically d/t completed infarct.  Plan to optimize medical therapy and arrange LAD intervention at later time. Echo showed EF 20-25%, RWMA, RV moderately reduced, moderate to severe ischemic MR, aortic root 4.3 cm, dilated IVC with estimated RAP 8 mmHg. AHF consulted and GDMT titrated, but limited due to soft blood pressure. CVTS consulted for CABG/MVR, instead of PCI to LAD, due to moderate to severe ischemic MR.    Admitted 5/23 with a/c CHF Echo EF 30-35%, RV moderately reduced, moderate pericardial effusion with no tamponade. Started on milrinone. Developed AFL and amiodarone gtt started. Underwent CABG x 2 (LIMA-LAD, SVG-RAMUS INT) +MVR, MAZE, LAA Clip. Hospitalization c/b distal esophageal stricture seen on barium swallow. GI consulted and plan for outpatient EGD/dilation.  Seen in ED 02/20/22 for fall after becoming light-headed. CT head and neck negative for acute abnormality.  Echo 03/12/22 EF 20-25%, severe LV dysfunction with global HK, + large Left pleural effusion, RV moderately down, moderate aortic stenosis, suspect component of low gradient AS, dimensionless index = 0.31. Volume overloaded and NYHA IV at follow up 03/12/22. Direct admit from clinic for IV diuresis and VAD work up. Diuresed with IV lasix. Underwent RHC showing elevated filling pressures with moderately decreased cardiac output, Fick CO/CI 5.7/2.7 Thermo CO/CI 4.7/2.2. CVTS and Dr. Gala Romney reviewed case and felt patient not a good candidate for VAD. Patient also declined VAD. Underwent bilateral thoracentesis before discharge. Discharged home, weight 172 lbs.  In AHF f/u  12/17/22 he was volume overloaded despite increasing diuretics. Given 2 doses of Furoscix. At f/u 7/12, symptoms slightly improved, weight down 6 lbs but ReDS still elevated at 42%, given 2 more days of Furoscix.   Today he returns for AHF follow up with his wife and daughter. Overall feeling fine just unable to sleep for the past month. Denies palpitations, CP, dizziness, edema, or PND/Orthopnea. No SOB. Appetite pretty good. No fever or chills. Weight at home 175 pounds. Taking all medications. Denies ETOH, smoking. Down 9 lbs since last Thursday with Furoscix.    Cardiac Studies  - RHC (10/23): RA = 8 RV = 45/10 PA = 48/16 (28) PCW = 22 (v = 30)  Fick cardiac output/index = 5.7/2.7 Thermo CO/CI = 4.7/2.2 PVR = 0.8 WU Ao sat = 94% PA sat = 54%, 57% PAPI = 4.0 - Echo (10/23): EF 20-25%, severe LV dysfunction with global HK, + large Left pleural effusion, RV moderately down, moderate aortic stenosis, suspect component of low gradient AS, dimensionless index = 0.31 - Echo (5/23): EF 25%, RV moderately reduced, moderate to severe MR - Echo (4/23): EF 20-25%, RWMA, RV moderately reduced, mioderate to severe ischemic MR, aortic root 4.3 cm, dilated IVC with estimated RAP 8 mmHg - LHC (5/23): 100% m RCA and 90% m LAD. RCA managed medically d/t completed infarct.   ROS: All systems reviewed and negative except as per HPI.   Past Medical History:  Diagnosis Date   Aortic atherosclerosis (HCC)    Arthritis    CHF (congestive heart failure) (HCC)    Esophageal stricture    Glaucoma    MI (myocardial infarction) (HCC)    Non-ST elevation (NSTEMI)  myocardial infarction (HCC) 10/10/2021   NSTEMI (non-ST elevated myocardial infarction) (HCC) 10/10/2021   Osteoarthritis    Severe mitral regurgitation    Stricture and stenosis of esophagus    Current Outpatient Medications  Medication Sig Dispense Refill   albuterol (PROVENTIL) (2.5 MG/3ML) 0.083% nebulizer solution Inhale 3 mLs into the lungs  every 6 (six) hours as needed for wheezing or shortness of breath. 90 mL 12   albuterol (VENTOLIN HFA) 108 (90 Base) MCG/ACT inhaler Inhale 2 puffs into the lungs every 6 (six) hours as needed for wheezing or shortness of breath. 8 g 0   amiodarone (PACERONE) 100 MG tablet TAKE ONE TABLET BY MOUTH ONCE DAILY 90 tablet 3   aspirin EC (ASPIRIN LOW DOSE) 81 MG tablet TAKE ONE TABLET BY MOUTH ONCE DAILY 90 tablet 3   atorvastatin (LIPITOR) 80 MG tablet TAKE ONE TABLET BY MOUTH EVERYDAY AT BEDTIME (Patient taking differently: Patient takes in the morning.) 90 tablet 3   cyanocobalamin (VITAMIN B12) 1000 MCG/ML injection Inject 1 mL (1,000 mcg total) into the muscle every 30 (thirty) days. 1 mL 3   dapagliflozin propanediol (FARXIGA) 10 MG TABS tablet Take 1 tablet (10 mg total) by mouth daily. 90 tablet 1   digoxin (LANOXIN) 0.125 MG tablet TAKE 1/2 TABLET BY MOUTH ONCE DAILY 45 tablet 3   escitalopram (LEXAPRO) 10 MG tablet Take 1 tablet (10 mg total) by mouth daily. 90 tablet 1   esomeprazole (NEXIUM) 40 MG capsule Take 40 mg by mouth daily.     ezetimibe (ZETIA) 10 MG tablet Take 0.5 tablets (5 mg total) by mouth daily. 90 tablet 1   Fluticasone-Umeclidin-Vilant (TRELEGY ELLIPTA) 100-62.5-25 MCG/ACT AEPB Inhale 1 puff into the lungs daily. (Patient taking differently: Inhale 1 puff into the lungs as needed.) 1 each 11   gabapentin (NEURONTIN) 600 MG tablet Take 1 tablet (600 mg total) by mouth at bedtime. 90 tablet 1   Iron, Ferrous Sulfate, 325 (65 Fe) MG TABS Take 325 mg by mouth daily. 30 tablet 1   latanoprost (XALATAN) 0.005 % ophthalmic solution Place 1 drop into both eyes at bedtime.     midodrine (PROAMATINE) 10 MG tablet Take 1 tablet (10 mg total) by mouth 3 (three) times daily. 270 tablet 3   nitroGLYCERIN (NITROSTAT) 0.4 MG SL tablet Place 1 tablet (0.4 mg total) under the tongue every 5 (five) minutes as needed for chest pain. 25 tablet 3   OVER THE COUNTER MEDICATION Apply 1 application   topically at bedtime. Medication: Mag Maxx Cream Back and restless leg     potassium chloride SA (KLOR-CON M) 20 MEQ tablet TAKE TWO TABLETS BY MOUTH ONCE DAILY (Patient taking differently: daily. Patient takes 2 tablets in the morning.) 30 tablet 6   spironolactone (ALDACTONE) 25 MG tablet TAKE ONE TABLET BY MOUTH ONCE DAILY 90 tablet 3   torsemide (DEMADEX) 20 MG tablet Take 5 tablets (100 mg total) by mouth daily. 150 tablet 5   warfarin (COUMADIN) 1 MG tablet Take 2-3 tablets BY MOUTH daily AS DIRECTED by coumadin clinic 200 tablet 0   No current facility-administered medications for this encounter.   No Known Allergies  Social History   Socioeconomic History   Marital status: Married    Spouse name: Not on file   Number of children: Not on file   Years of education: Not on file   Highest education level: Not on file  Occupational History   Not on file  Tobacco Use  Smoking status: Former    Current packs/day: 0.00    Types: Cigarettes    Quit date: 1970    Years since quitting: 54.5   Smokeless tobacco: Never  Vaping Use   Vaping status: Never Used  Substance and Sexual Activity   Alcohol use: Not Currently   Drug use: Never   Sexual activity: Not Currently    Birth control/protection: None  Other Topics Concern   Not on file  Social History Narrative   Not on file   Social Determinants of Health   Financial Resource Strain: High Risk (08/28/2022)   Overall Financial Resource Strain (CARDIA)    Difficulty of Paying Living Expenses: Very hard  Food Insecurity: Food Insecurity Present (07/04/2022)   Hunger Vital Sign    Worried About Running Out of Food in the Last Year: Often true    Ran Out of Food in the Last Year: Sometimes true  Transportation Needs: Unmet Transportation Needs (08/28/2022)   PRAPARE - Transportation    Lack of Transportation (Medical): Yes    Lack of Transportation (Non-Medical): Yes  Physical Activity: Sufficiently Active (04/14/2022)    Exercise Vital Sign    Days of Exercise per Week: 5 days    Minutes of Exercise per Session: 30 min  Stress: No Stress Concern Present (04/14/2022)   [employer] of Occupational Health - Occupational Stress Questionnaire    Feeling of Stress : Not at all  Social Connections: Moderately Integrated (04/17/2022)   Social Connection and Isolation Panel [NHANES]    Frequency of Communication with Friends and Family: More than three times a week    Frequency of Social Gatherings with Friends and Family: More than three times a week    Attends Religious Services: More than 4 times per year    Active Member of [employer] or Organizations: No    Attends Banker Meetings: Never    Marital Status: Married  Catering manager Violence: Not At Risk (04/17/2022)   Humiliation, Afraid, Rape, and Kick questionnaire    Fear of Current or Ex-Partner: No    Emotionally Abused: No    Physically Abused: No    Sexually Abused: No   Family History  Problem Relation Age of Onset   Heart attack Mother    Hypertension Mother    Heart attack Father    Hypertension Father    Stomach cancer Sister    Brain cancer Sister    BP 102/64   Pulse (!) 49   Wt 84.2 kg (185 lb 9.6 oz)   SpO2 97%   BMI 24.49 kg/m   Wt Readings from Last 3 Encounters:  12/25/22 84.2 kg (185 lb 9.6 oz)  12/19/22 88.3 kg (194 lb 9.6 oz)  12/17/22 90.1 kg (198 lb 9.6 oz)   PHYSICAL EXAM: General:  elderly appearing.  No respiratory difficulty. Arrived via WC HEENT: normal Neck: supple. JVD ~9 cm. Carotids 2+ bilat; no bruits. No lymphadenopathy or thyromegaly appreciated. Cor: PMI nondisplaced. Regular rate & rhythm. No rubs, gallops or murmurs. Lungs: coarse bases Abdomen: soft, nontender, nondistended. No hepatosplenomegaly. No bruits or masses. Good bowel sounds. Extremities: no cyanosis, clubbing, rash, trace BLE edema  Neuro: alert & oriented x 3, cranial nerves grossly intact. moves all 4 extremities w/o  difficulty. Affect pleasant.   ReDS 37 %  ECG  none today  ASSESSMENT & PLAN: 1. Chronic Systolic CHF /ICM - s/p late presentation inferior STEMI 05/23 - Echo (5/23): EF 25-30%, RV moderately reduced, moderate to  severe MR. - S/p CABG + bioprosthetic MVR 11/01/21 - Echo 03/12/22 showed EF 20-25%, severe LV dysfunction with global HK, + large Left pleural effusion, RV moderately down, moderate aortic stenosis, suspect component of low gradient AS. - RHC (10/23): Elevated filling pressures with moderately decreased cardiac output. RA 8, PA 48/16, PCWP 22, CO/CI (Fick) 5.7/2.7. - Felt not to be a suitable VAD candidate (Drs. Gala Romney & Bartle discussed). Patient declined VAD as well. - Will focus on aggressive medical therapy with tight volume control. (Potentially candidate for ADI volume management trial?).  - Weight down 9 lbs since last Friday. With Furoscix.  - Continue torsemide at 100 mg daily +40 mEq daily KDUR. ReDS much improved. BMET/BNP today - Stop digoxin, was supposed to stop last week but never did, HR in 40s today. Level at 0.8 12/19/22, can retrial in the future if renal function improves  - Continue Farxiga 10 mg daily.  - Continue spiro 25 mg daily - no Entresto given renal fx and need for midodrine for BP support  - Continue midodrine 10 mg tid. - no ? blocker given low BP and bradycardia - encouraged fluid and sodium restriction  - Echo today official read pending  - 2. CAD/ S/p Late Presentation Inferior STEMI - LHC 05/23: 100% m RCA and 90% m LAD.  - RCA managed medically d/t completed infarct.  - Echo (5/23) EF 25%, RV moderately reduced, moderate to severe MR - Echo (10/23): EF 20-25%, RV moderately down. - s/p CABG x 2 (LIMA-LAD, SVG-Ramus INT)  (11/01/21) - stable w/o CP  - Continue ASA + high intensity statin.    3. Mitral regurgitation - Moderate to severe ischemic MR on TEE.  - S/p bioprosthetic MVR 11/02/21. - On warfarin per TCTS. INR followed by  [facility].  - update echo, read pending   4. LFLG Aortic stenosis - Moderate on echo 10/23 - May also have to think about TAVR at some point if he is a candidate. - repeat echo to reassess severity in light of worsening HF symptoms, read pending   5. H/o Pericardial effusion/Acute Pericarditis  - Moderate in size on past echo, w/o tamponade. - Acute pericarditis/ adhensions noted at time of CABG. - Resolved on echo 10/23  - echo read pending   6. PAF - s/p MAZE w/ CABG  - Continue amiodarone 100 mg daily.  - Continue warfarin. - [facility].  7. Fatigue - check anemia panel - s/p feraheme in October, suspect he will need more  8. Insomnia - Discussed either increasing melatonin 3mg >6mg  or cutting his trazodone in half (full dose made him extra drowsy/unsteady)   Follow up 3 weeks with APP to reassess fluid level and renal function.   Alen Bleacher, NP  10:57 AM

## 2022-12-19 ENCOUNTER — Ambulatory Visit (HOSPITAL_COMMUNITY)
Admission: RE | Admit: 2022-12-19 | Discharge: 2022-12-19 | Disposition: A | Discharge status: Home / Self Care | Payer: Medicare HMO | Source: Ambulatory Visit | Attending: Cardiology | Admitting: Cardiology

## 2022-12-19 ENCOUNTER — Encounter (HOSPITAL_COMMUNITY): Payer: Self-pay

## 2022-12-19 VITALS — BP 124/86 | HR 56 | Wt 194.6 lb

## 2022-12-19 DIAGNOSIS — I309 Acute pericarditis, unspecified: Secondary | ICD-10-CM | POA: Diagnosis not present

## 2022-12-19 DIAGNOSIS — I959 Hypotension, unspecified: Secondary | ICD-10-CM | POA: Diagnosis not present

## 2022-12-19 DIAGNOSIS — J449 Chronic obstructive pulmonary disease, unspecified: Secondary | ICD-10-CM | POA: Diagnosis not present

## 2022-12-19 DIAGNOSIS — I480 Paroxysmal atrial fibrillation: Secondary | ICD-10-CM | POA: Diagnosis not present

## 2022-12-19 DIAGNOSIS — Z79899 Other long term (current) drug therapy: Secondary | ICD-10-CM | POA: Diagnosis not present

## 2022-12-19 DIAGNOSIS — J90 Pleural effusion, not elsewhere classified: Secondary | ICD-10-CM | POA: Diagnosis not present

## 2022-12-19 DIAGNOSIS — I5022 Chronic systolic (congestive) heart failure: Secondary | ICD-10-CM | POA: Insufficient documentation

## 2022-12-19 DIAGNOSIS — Z953 Presence of xenogenic heart valve: Secondary | ICD-10-CM | POA: Diagnosis not present

## 2022-12-19 DIAGNOSIS — Z7901 Long term (current) use of anticoagulants: Secondary | ICD-10-CM | POA: Insufficient documentation

## 2022-12-19 DIAGNOSIS — I2510 Atherosclerotic heart disease of native coronary artery without angina pectoris: Secondary | ICD-10-CM | POA: Insufficient documentation

## 2022-12-19 DIAGNOSIS — I252 Old myocardial infarction: Secondary | ICD-10-CM | POA: Insufficient documentation

## 2022-12-19 DIAGNOSIS — I080 Rheumatic disorders of both mitral and aortic valves: Secondary | ICD-10-CM | POA: Insufficient documentation

## 2022-12-19 DIAGNOSIS — K219 Gastro-esophageal reflux disease without esophagitis: Secondary | ICD-10-CM | POA: Insufficient documentation

## 2022-12-19 DIAGNOSIS — Z951 Presence of aortocoronary bypass graft: Secondary | ICD-10-CM | POA: Diagnosis not present

## 2022-12-19 DIAGNOSIS — E785 Hyperlipidemia, unspecified: Secondary | ICD-10-CM | POA: Insufficient documentation

## 2022-12-19 LAB — BASIC METABOLIC PANEL
Anion gap: 13 (ref 5–15)
BUN: 42 mg/dL — ABNORMAL HIGH (ref 8–23)
CO2: 27 mmol/L (ref 22–32)
Calcium: 9.2 mg/dL (ref 8.9–10.3)
Chloride: 93 mmol/L — ABNORMAL LOW (ref 98–111)
Creatinine, Ser: 1.99 mg/dL — ABNORMAL HIGH (ref 0.61–1.24)
GFR, Estimated: 34 mL/min — ABNORMAL LOW (ref >60)
Glucose, Bld: 88 mg/dL (ref 70–99)
Potassium: 4.4 mmol/L (ref 3.5–5.1)
Sodium: 133 mmol/L — ABNORMAL LOW (ref 135–145)

## 2022-12-19 LAB — BRAIN NATRIURETIC PEPTIDE: B Natriuretic Peptide: 1929.5 pg/mL — ABNORMAL HIGH (ref 0.0–100.0)

## 2022-12-19 LAB — DIGOXIN LEVEL: Digoxin Level: 0.8 ng/mL (ref 0.8–2.0)

## 2022-12-19 MED ORDER — TORSEMIDE 20 MG PO TABS: 100.0000 mg | ORAL_TABLET | Freq: Every day | ORAL | 5 refills | Status: DC

## 2022-12-19 MED ORDER — METOLAZONE 2.5 MG PO TABS: 2.5000 mg | ORAL_TABLET | ORAL | 0 refills | Status: DC

## 2022-12-19 NOTE — Patient Instructions (Addendum)
RedsClip done today.  Labs done today. We will contact you only if your labs are abnormal.  INCREASE Torsemide to 100mg  (5 tablets) by mouth daily STARTING TOMORROW.   TAKE FUROSCIX THIS EVENING.   WE HAVE CALLED IN 1 TABLET OF METOLAZONE WE WILL CALL YOU TO LET YOU KNOW IF YOU NEED TO TAKE THIS BASED ON YOUR LAB RESULTS.   No other medication changes were made. Please continue all current medications as prescribed.  Your physician recommends that you keep your scheduled a follow-up appointment next week.  Please contact [employer] at [email or web address] or [phone].   If you have any questions or concerns before your next appointment please send [place] a message through [place] or call our office at [phone].    TO LEAVE A MESSAGE FOR THE NURSE SELECT OPTION 2, PLEASE LEAVE A MESSAGE INCLUDING: YOUR NAME DATE OF BIRTH CALL BACK NUMBER REASON FOR CALL**this is important as we prioritize the call backs  YOU WILL RECEIVE A CALL BACK THE SAME DAY AS LONG AS YOU CALL BEFORE 4:00 PM   Do the following things EVERYDAY: Weigh yourself in the morning before breakfast. Write it down and keep it in a log. Take your medicines as prescribed Eat low salt foods--Limit salt (sodium) to 2000 mg per day.  Stay as active as you can everyday Limit all fluids for the day to less than 2 liters   At the [facility], you and your health needs are our priority. As part of our continuing mission to provide you with exceptional heart care, we have created designated Provider Care Teams. These Care Teams include your primary Cardiologist (physician) and Advanced Practice Providers (APPs- Physician Assistants and Nurse Practitioners) who all work together to provide you with the care you need, when you need it.   You may see any of the following providers on your designated Care Team at your next follow up: Dr Arvilla Meres Dr Marca Ancona Dr. Marcos Eke, NP Robbie Lis, [place] [facility] [place], [place] Brynda Peon, NP Karle Plumber, PharmD   Please be sure to bring in all your medications bottles to every appointment.    Thank you for choosing [facility]

## 2022-12-19 NOTE — Progress Notes (Signed)
ReDS Vest / Clip - 12/19/22 1200       ReDS Vest / Clip   Station Marker C    Ruler Value 30    ReDS Value Range High volume overload    ReDS Actual Value 74

## 2022-12-19 NOTE — Progress Notes (Signed)
ADVANCED HF CLINIC NOTE   Primary Care: Blane Ohara, MD HF Cardiologist: Dr. Gala Romney   HPI: Keith Jenkins is a 77 y.o. male with history of COPD, prediabetes, GERD/dysphagia, HLD, and new diagnosis of CAD and systolic heart failure.  Admitted 4/23 with late presenting inferior STEMI. LHC showed total occlusion mRCA and 90% mLAD. RCA managed medically d/t completed infarct.  Plan to optimize medical therapy and arrange LAD intervention at later time. Echo showed EF 20-25%, RWMA, RV moderately reduced, moderate to severe ischemic MR, aortic root 4.3 cm, dilated IVC with estimated RAP 8 mmHg. AHF consulted and GDMT titrated, but limited due to soft blood pressure. CVTS consulted for CABG/MVR, instead of PCI to LAD, due to moderate to severe ischemic MR.    Admitted 5/23 with a/c CHF Echo EF 30-35%, RV moderately reduced, moderate pericardial effusion with no tamponade. Started on milrinone. Developed AFL and amiodarone gtt started. Underwent CABG x 2 (LIMA-LAD, SVG-RAMUS INT) +MVR, MAZE, LAA Clip. Hospitalization c/b distal esophageal stricture seen on barium swallow. GI consulted and plan for outpatient EGD/dilation.  Seen in ED 02/20/22 for fall after becoming light-headed. CT head and neck negative for acute abnormality.  Echo 03/12/22 EF 20-25%, severe LV dysfunction with global HK, + large Left pleural effusion, RV moderately down, moderate aortic stenosis, suspect component of low gradient AS, dimensionless index = 0.31. Reviewed by Dr. Gala Romney. Volume overloaded and NYHA IV at follow up 03/12/22.. Direct admit from clinic for IV diuresis and VAD work up. Diuresed with IV lasix. Underwent RHC showing elevated filling pressures with moderately decreased cardiac output, Fick CO/CI 5.7/2.7 Thermo CO/CI 4.7/2.2. CVTS and Dr. Gala Romney reviewed case and felt patient not a good candidate for VAD. Patient also declined VAD. Underwent bilateral thoracentesis before discharge. Discharged home, weight  172 lbs.  Seen in clinic 7/10 as a work-in given complaints of worsening exertional dyspnea and orthopnea. No PND. Reported full med compliance but admitted to dietary indiscretion w/ high sodium foods. Eating lots of processed meats and soups. Wt was up 10 lb and ReDs was elevated at 43%. BNP was 1,600. He tried increasing torsemide from 80 mg once daily to 80 mg qam + 40 mg qpm but c/w volume overload. He was prescribed Furoscix 80 mg daily x 2 days.   He returns today for f/u. Here w/ wife and daughter. Wt down 4 lb. LEE improved. Breathing also improving, slept better last night and was less orthopneic. BP stable 124/86. ReDs remains elevated at 42%, still ~6 lb above b/l wt.     Cardiac Studies  - RHC (10/23): RA = 8 RV = 45/10 PA = 48/16 (28) PCW = 22 (v = 30)  Fick cardiac output/index = 5.7/2.7 Thermo CO/CI = 4.7/2.2 PVR = 0.8 WU Ao sat = 94% PA sat = 54%, 57% PAPI = 4.0   - Echo (10/23): EF 20-25%, severe LV dysfunction with global HK, + large Left pleural effusion, RV moderately down, moderate aortic stenosis, suspect component of low gradient AS, dimensionless index = 0.31  - Echo (5/23): EF 25%, RV moderately reduced, moderate to severe MR  - Echo (4/23): EF 20-25%, RWMA, RV moderately reduced, mioderate to severe ischemic MR, aortic root 4.3 cm, dilated IVC with estimated RAP 8 mmHg  - LHC (5/23): 100% m RCA and 90% m LAD. RCA managed medically d/t completed infarct.   ROS: All systems reviewed and negative except as per HPI.   Past Medical History:  Diagnosis Date  Aortic atherosclerosis (HCC)    Arthritis    CHF (congestive heart failure) (HCC)    Esophageal stricture    Glaucoma    MI (myocardial infarction) (HCC)    Non-ST elevation (NSTEMI) myocardial infarction (HCC) 10/10/2021   NSTEMI (non-ST elevated myocardial infarction) (HCC) 10/10/2021   Osteoarthritis    Severe mitral regurgitation    Stricture and stenosis of esophagus    Current Outpatient  Medications  Medication Sig Dispense Refill   albuterol (PROVENTIL) (2.5 MG/3ML) 0.083% nebulizer solution Inhale 3 mLs into the lungs every 6 (six) hours as needed for wheezing or shortness of breath. 90 mL 12   albuterol (VENTOLIN HFA) 108 (90 Base) MCG/ACT inhaler Inhale 2 puffs into the lungs every 6 (six) hours as needed for wheezing or shortness of breath. 8 g 0   amiodarone (PACERONE) 100 MG tablet TAKE ONE TABLET BY MOUTH ONCE DAILY 90 tablet 3   aspirin EC (ASPIRIN LOW DOSE) 81 MG tablet TAKE ONE TABLET BY MOUTH ONCE DAILY 90 tablet 3   atorvastatin (LIPITOR) 80 MG tablet TAKE ONE TABLET BY MOUTH EVERYDAY AT BEDTIME (Patient taking differently: Patient takes in the morning.) 90 tablet 3   cyanocobalamin (VITAMIN B12) 1000 MCG/ML injection Inject 1 mL (1,000 mcg total) into the muscle every 30 (thirty) days. 1 mL 3   dapagliflozin propanediol (FARXIGA) 10 MG TABS tablet Take 1 tablet (10 mg total) by mouth daily. 90 tablet 1   escitalopram (LEXAPRO) 10 MG tablet Take 1 tablet (10 mg total) by mouth daily. 90 tablet 1   esomeprazole (NEXIUM) 40 MG capsule Take 40 mg by mouth daily.     ezetimibe (ZETIA) 10 MG tablet Take 0.5 tablets (5 mg total) by mouth daily. 90 tablet 1   Fluticasone-Umeclidin-Vilant (TRELEGY ELLIPTA) 100-62.5-25 MCG/ACT AEPB Inhale 1 puff into the lungs daily. (Patient taking differently: Inhale 1 puff into the lungs as needed.) 1 each 11   gabapentin (NEURONTIN) 600 MG tablet Take 1 tablet (600 mg total) by mouth at bedtime. 90 tablet 1   Iron, Ferrous Sulfate, 325 (65 Fe) MG TABS Take 325 mg by mouth daily. 30 tablet 1   latanoprost (XALATAN) 0.005 % ophthalmic solution Place 1 drop into both eyes at bedtime.     midodrine (PROAMATINE) 10 MG tablet Take 1 tablet (10 mg total) by mouth 3 (three) times daily. 270 tablet 3   nitroGLYCERIN (NITROSTAT) 0.4 MG SL tablet Place 1 tablet (0.4 mg total) under the tongue every 5 (five) minutes as needed for chest pain. 25 tablet 3    OVER THE COUNTER MEDICATION Apply 1 application  topically at bedtime. Medication: Mag Maxx Cream Back and restless leg     potassium chloride SA (KLOR-CON M) 20 MEQ tablet TAKE TWO TABLETS BY MOUTH ONCE DAILY (Patient taking differently: daily. Patient takes 2 tablets in the morning and 1 tablet at night.) 30 tablet 6   spironolactone (ALDACTONE) 25 MG tablet TAKE ONE TABLET BY MOUTH ONCE DAILY 90 tablet 3   torsemide (DEMADEX) 20 MG tablet Take 4 tablets (80 mg total) by mouth daily. 360 tablet 1   warfarin (COUMADIN) 1 MG tablet Take 2-3 tablets BY MOUTH daily AS DIRECTED by coumadin clinic 200 tablet 0   digoxin (LANOXIN) 0.125 MG tablet TAKE 1/2 TABLET BY MOUTH ONCE DAILY (Patient not taking: Reported on 12/19/2022) 45 tablet 3   No current facility-administered medications for this encounter.   No Known Allergies  Social History   Socioeconomic History  Marital status: Married    Spouse name: Not on file   Number of children: Not on file   Years of education: Not on file   Highest education level: Not on file  Occupational History   Not on file  Tobacco Use   Smoking status: Former    Current packs/day: 0.00    Types: Cigarettes    Quit date: 1970    Years since quitting: 54.5   Smokeless tobacco: Never  Vaping Use   Vaping status: Never Used  Substance and Sexual Activity   Alcohol use: Not Currently   Drug use: Never   Sexual activity: Not Currently    Birth control/protection: None  Other Topics Concern   Not on file  Social History Narrative   Not on file   Social Determinants of Health   Financial Resource Strain: High Risk (08/28/2022)   Overall Financial Resource Strain (CARDIA)    Difficulty of Paying Living Expenses: Very hard  Food Insecurity: Food Insecurity Present (07/04/2022)   Hunger Vital Sign    Worried About Running Out of Food in the Last Year: Often true    Ran Out of Food in the Last Year: Sometimes true  Transportation Needs: Unmet  Transportation Needs (08/28/2022)   PRAPARE - Transportation    Lack of Transportation (Medical): Yes    Lack of Transportation (Non-Medical): Yes  Physical Activity: Sufficiently Active (04/14/2022)   Exercise Vital Sign    Days of Exercise per Week: 5 days    Minutes of Exercise per Session: 30 min  Stress: No Stress Concern Present (04/14/2022)   [employer] of Occupational Health - Occupational Stress Questionnaire    Feeling of Stress : Not at all  Social Connections: Moderately Integrated (04/17/2022)   Social Connection and Isolation Panel [NHANES]    Frequency of Communication with Friends and Family: More than three times a week    Frequency of Social Gatherings with Friends and Family: More than three times a week    Attends Religious Services: More than 4 times per year    Active Member of [employer] or Organizations: No    Attends Banker Meetings: Never    Marital Status: Married  Catering manager Violence: Not At Risk (04/17/2022)   Humiliation, Afraid, Rape, and Kick questionnaire    Fear of Current or Ex-Partner: No    Emotionally Abused: No    Physically Abused: No    Sexually Abused: No   Family History  Problem Relation Age of Onset   Heart attack Mother    Hypertension Mother    Heart attack Father    Hypertension Father    Stomach cancer Sister    Brain cancer Sister    BP 124/86   Pulse (!) 56   Wt 88.3 kg (194 lb 9.6 oz)   SpO2 97%   BMI 25.67 kg/m   Wt Readings from Last 3 Encounters:  12/19/22 88.3 kg (194 lb 9.6 oz)  12/17/22 90.1 kg (198 lb 9.6 oz)  09/12/22 85.3 kg (188 lb)   PHYSICAL EXAM: ReDs 42%  General:  Well appearing. No respiratory difficulty HEENT: normal Neck: supple. JVD 10-12 cm. Carotids 2+ bilat; no bruits. No lymphadenopathy or thyromegaly appreciated. Cor: PMI nondisplaced. Regular rate & rhythm. 2/6 SEM RUSB  Lungs: decreased BS at the bases  Abdomen: soft, nontender, +distended. No hepatosplenomegaly. No  bruits or masses. Good bowel sounds. Extremities: no cyanosis, clubbing, rash, trace b/l LE edema Neuro: alert & oriented x 3,  cranial nerves grossly intact. moves all 4 extremities w/o difficulty. Affect pleasant.    ECG not performed   ASSESSMENT & PLAN: 1. Acute on Chronic Systolic CHF /ICM - s/p late presentation inferior STEMI 05/23 - Echo (5/23): EF 25-30%, RV moderately reduced, moderate to severe MR. - S/p CABG + bioprosthetic MVR 11/01/21 - Echo 03/12/22 showed EF 20-25%, severe LV dysfunction with global HK, + large Left pleural effusion, RV moderately down, moderate aortic stenosis, suspect component of low gradient AS. - RHC (10/23): Elevated filling pressures with moderately decreased cardiac output. RA 8, PA 48/16, PCWP 22, CO/CI (Fick) 5.7/2.7. - Felt not to be a suitable VAD candidate (Drs. Gala Romney & Bartle discussed). Patient declined VAD as well. - Will focus on aggressive medical therapy with tight volume control. (Potentially candidate for ADI volume management trial?). - Volume overloaded today in setting of dietary indiscretion w/ sodium. Little improvement despite escalation of home oral diuretic regimen. He had positive response to Furoscix. Wt down 4 lb and less orthopneic but still w/ volume overload. Wt still up 6 lb. ReDs 42%. He took 80 mg of PO torsemide this morning.  - Will have him take another Furoscix 80 mg this evening x 1 + 2.5 of metolazone x 1 w/ extra 40 mEq of KCl. BMP and BNP today  - Starting tomorrow, will increase Torsemide to 100 mg daily  - Continue digoxin 0.0625 mg daily. Check Digoxin level  - Continue Farxiga 10 mg daily.  - Continue spiro 25 mg daily - no Entresto given renal fx and need for midodrine for BP support  - Continue midodrine 10 mg tid. - no ? blocker given low BP and bradycardia - encouraged fluid and sodium restriction  - will repeat echo to reassess LV function and severity of Aortic valve stenosis at next visit - keep f/u  next week to reassess volume status and f/u labs - Issues w/ volume management has been on ongoing issue w/ him. We also discussed possible CardioMEMs. This may be beneficial but suspect progression of AS may be contributing to issues w/ volume overload. Given possibility of needed TAVR in the near future, will hold off on this for now.   - 2. CAD/ S/p Late Presentation Inferior STEMI - LHC 05/23: 100% m RCA and 90% m LAD.  - RCA managed medically d/t completed infarct.  - Echo (5/23) EF 25%, RV moderately reduced, moderate to severe MR - Echo (10/23): EF 20-25%, RV moderately down. - s/p CABG x 2 (LIMA-LAD, SVG-Ramus INT)  (11/01/21) - stable w/o CP  - Continue ASA + high intensity statin.    3. Mitral regurgitation - Moderate to severe ischemic MR on TEE.  - S/p bioprosthetic MVR 11/02/21. - On warfarin per TCTS. INR followed by [facility].  - update echo    4. LFLG Aortic stenosis - Moderate on echo 10/23 - May also have to think about TAVR at some point if he is a candidate. - will repeat echo to reassess severity in light of worsening HF symptoms    5. H/o Pericardial effusion/Acute Pericarditis  - Moderate in size on past echo, w/o tamponade. - Acute pericarditis/ adhensions noted at time of CABG. - Resolved on echo 10/23  - Repeat echo per above    6. PAF - s/p MAZE w/ CABG  - in NSR on EKG 7/10. RRR on exam today  - Continue amiodarone 100 mg daily.  - Continue warfarin. - [facility] followed [facility].  f/u in 1 wk to reassess volume status and f/u labs.   Robbie Lis, PA-C  11:47 AM

## 2022-12-23 ENCOUNTER — Encounter (HOSPITAL_COMMUNITY): Payer: Self-pay | Admitting: Internal Medicine

## 2022-12-23 ENCOUNTER — Ambulatory Visit: Payer: Medicare HMO | Attending: Cardiology

## 2022-12-23 DIAGNOSIS — Z952 Presence of prosthetic heart valve: Secondary | ICD-10-CM

## 2022-12-23 DIAGNOSIS — Z7901 Long term (current) use of anticoagulants: Secondary | ICD-10-CM | POA: Diagnosis not present

## 2022-12-23 LAB — POCT INR: INR: 2.9 (ref 2.0–3.0)

## 2022-12-23 NOTE — Patient Instructions (Signed)
Description   Continue taking warfarin 3 tablets daily (3mg ).  Recheck INR in 6 weeks.  [facility] [phone] Fax Clearance form to [phone]

## 2022-12-24 NOTE — Telephone Encounter (Signed)
See message from daughter  Note patient has follow up with echo 7/18 App Provider 7/18- A Trisha Mangle

## 2022-12-25 ENCOUNTER — Encounter (HOSPITAL_COMMUNITY): Payer: Self-pay

## 2022-12-25 ENCOUNTER — Ambulatory Visit (HOSPITAL_COMMUNITY)
Admission: RE | Admit: 2022-12-25 | Discharge: 2022-12-25 | Disposition: A | Discharge status: Home / Self Care | Payer: Medicare HMO | Source: Ambulatory Visit | Attending: Cardiology | Admitting: Cardiology

## 2022-12-25 ENCOUNTER — Ambulatory Visit (HOSPITAL_COMMUNITY)
Admission: RE | Admit: 2022-12-25 | Discharge: 2022-12-25 | Disposition: A | Discharge status: Home / Self Care | Payer: Medicare HMO | Source: Ambulatory Visit

## 2022-12-25 VITALS — BP 102/64 | HR 49 | Wt 185.6 lb

## 2022-12-25 DIAGNOSIS — E785 Hyperlipidemia, unspecified: Secondary | ICD-10-CM | POA: Diagnosis not present

## 2022-12-25 DIAGNOSIS — I083 Combined rheumatic disorders of mitral, aortic and tricuspid valves: Secondary | ICD-10-CM | POA: Diagnosis not present

## 2022-12-25 DIAGNOSIS — J449 Chronic obstructive pulmonary disease, unspecified: Secondary | ICD-10-CM | POA: Insufficient documentation

## 2022-12-25 DIAGNOSIS — Z7901 Long term (current) use of anticoagulants: Secondary | ICD-10-CM | POA: Insufficient documentation

## 2022-12-25 DIAGNOSIS — Z953 Presence of xenogenic heart valve: Secondary | ICD-10-CM | POA: Insufficient documentation

## 2022-12-25 DIAGNOSIS — I309 Acute pericarditis, unspecified: Secondary | ICD-10-CM | POA: Insufficient documentation

## 2022-12-25 DIAGNOSIS — I350 Nonrheumatic aortic (valve) stenosis: Secondary | ICD-10-CM

## 2022-12-25 DIAGNOSIS — J90 Pleural effusion, not elsewhere classified: Secondary | ICD-10-CM | POA: Insufficient documentation

## 2022-12-25 DIAGNOSIS — K219 Gastro-esophageal reflux disease without esophagitis: Secondary | ICD-10-CM | POA: Insufficient documentation

## 2022-12-25 DIAGNOSIS — I428 Other cardiomyopathies: Secondary | ICD-10-CM | POA: Diagnosis not present

## 2022-12-25 DIAGNOSIS — I3139 Other pericardial effusion (noninflammatory): Secondary | ICD-10-CM | POA: Diagnosis not present

## 2022-12-25 DIAGNOSIS — G4700 Insomnia, unspecified: Secondary | ICD-10-CM | POA: Insufficient documentation

## 2022-12-25 DIAGNOSIS — I429 Cardiomyopathy, unspecified: Secondary | ICD-10-CM | POA: Insufficient documentation

## 2022-12-25 DIAGNOSIS — Z951 Presence of aortocoronary bypass graft: Secondary | ICD-10-CM | POA: Insufficient documentation

## 2022-12-25 DIAGNOSIS — I5022 Chronic systolic (congestive) heart failure: Secondary | ICD-10-CM

## 2022-12-25 DIAGNOSIS — R400 Somnolence: Secondary | ICD-10-CM | POA: Diagnosis not present

## 2022-12-25 DIAGNOSIS — I2510 Atherosclerotic heart disease of native coronary artery without angina pectoris: Secondary | ICD-10-CM | POA: Diagnosis not present

## 2022-12-25 DIAGNOSIS — I252 Old myocardial infarction: Secondary | ICD-10-CM | POA: Insufficient documentation

## 2022-12-25 DIAGNOSIS — I340 Nonrheumatic mitral (valve) insufficiency: Secondary | ICD-10-CM | POA: Diagnosis not present

## 2022-12-25 DIAGNOSIS — I480 Paroxysmal atrial fibrillation: Secondary | ICD-10-CM | POA: Diagnosis not present

## 2022-12-25 DIAGNOSIS — Z79899 Other long term (current) drug therapy: Secondary | ICD-10-CM | POA: Diagnosis not present

## 2022-12-25 DIAGNOSIS — I700 Atherosclerosis of aorta: Secondary | ICD-10-CM | POA: Diagnosis not present

## 2022-12-25 DIAGNOSIS — I959 Hypotension, unspecified: Secondary | ICD-10-CM | POA: Insufficient documentation

## 2022-12-25 DIAGNOSIS — R5383 Other fatigue: Secondary | ICD-10-CM

## 2022-12-25 DIAGNOSIS — R7303 Prediabetes: Secondary | ICD-10-CM | POA: Insufficient documentation

## 2022-12-25 LAB — ECHOCARDIOGRAM COMPLETE
AR max vel: 0.73 cm2
AV Area VTI: 0.71 cm2
AV Area mean vel: 0.67 cm2
AV Mean grad: 17 mmHg
AV Peak grad: 24.6 mmHg
Ao pk vel: 2.48 m/s
Area-P 1/2: 2.66 cm2
Calc EF: 24.9 %
MV VTI: 0.81 cm2
P 1/2 time: 766 msec
S' Lateral: 5.9 cm
Single Plane A2C EF: 27.1 %
Single Plane A4C EF: 21.5 %

## 2022-12-25 LAB — IRON AND TIBC
Iron: 77 ug/dL (ref 45–182)
Saturation Ratios: 15 % — ABNORMAL LOW (ref 17.9–39.5)
TIBC: 507 ug/dL — ABNORMAL HIGH (ref 250–450)
UIBC: 430 ug/dL

## 2022-12-25 LAB — BASIC METABOLIC PANEL
Anion gap: 11 (ref 5–15)
BUN: 70 mg/dL — ABNORMAL HIGH (ref 8–23)
CO2: 27 mmol/L (ref 22–32)
Calcium: 8.7 mg/dL — ABNORMAL LOW (ref 8.9–10.3)
Chloride: 87 mmol/L — ABNORMAL LOW (ref 98–111)
Creatinine, Ser: 2.71 mg/dL — ABNORMAL HIGH (ref 0.61–1.24)
GFR, Estimated: 24 mL/min — ABNORMAL LOW (ref >60)
Glucose, Bld: 79 mg/dL (ref 70–99)
Potassium: 4.6 mmol/L (ref 3.5–5.1)
Sodium: 125 mmol/L — ABNORMAL LOW (ref 135–145)

## 2022-12-25 LAB — BRAIN NATRIURETIC PEPTIDE: B Natriuretic Peptide: 1628 pg/mL — ABNORMAL HIGH (ref 0.0–100.0)

## 2022-12-25 LAB — FERRITIN: Ferritin: 108 ng/mL (ref 24–336)

## 2022-12-25 NOTE — Progress Notes (Signed)
ReDS Vest / Clip - 12/25/22 1000       ReDS Vest / Clip   Station Marker D    Ruler Value 32    ReDS Value Range Moderate volume overload    ReDS Actual Value 37

## 2022-12-25 NOTE — Patient Instructions (Signed)
Medication Changes:  STOP TAKING DIGOXIN   *If you need a refill on your cardiac medications before your next appointment, please call your pharmacy*  Lab Work:  Labs done today, your results will be available in MyChart, we will contact you for abnormal readings.   Follow-Up in:   Your physician recommends that you schedule a follow-up appointment in: 3 Weeks with APP (PA/NP)    Do the following things EVERYDAY: Weigh yourself in the morning before breakfast. Write it down and keep it in a log. Take your medicines as prescribed Eat low salt foods--Limit salt (sodium) to 2000 mg per day.  Stay as active as you can everyday Limit all fluids for the day to less than 2 liters    Need to Contact us:  If you have any questions or concerns before your next appointment please send [place] a message through [place] or call our office at [phone].    TO LEAVE A MESSAGE FOR THE NURSE SELECT OPTION 2, PLEASE LEAVE A MESSAGE INCLUDING: YOUR NAME DATE OF BIRTH CALL BACK NUMBER REASON FOR CALL**this is important as we prioritize the call backs  YOU WILL RECEIVE A CALL BACK THE SAME DAY AS LONG AS YOU CALL BEFORE 4:00 PM   At the [facility], you and your health needs are our priority. As part of our continuing mission to provide you with exceptional heart care, we have created designated Provider Care Teams. These Care Teams include your primary Cardiologist (physician) and Advanced Practice Providers (APPs- Physician Assistants and Nurse Practitioners) who all work together to provide you with the care you need, when you need it.   You may see any of the following providers on your designated Care Team at your next follow up: Dr Arvilla Meres Dr Marca Ancona Dr. Marcos Eke, NP Robbie Lis, [place] [facility] [place], [place] Brynda Peon, NP Karle Plumber, PharmD   Please be sure to bring in all your medications bottles to every  appointment.    Thank you for choosing [facility]

## 2022-12-26 ENCOUNTER — Telehealth (HOSPITAL_COMMUNITY): Payer: Self-pay | Admitting: Cardiology

## 2022-12-26 ENCOUNTER — Encounter (HOSPITAL_COMMUNITY): Payer: Self-pay | Admitting: Cardiology

## 2022-12-26 DIAGNOSIS — I5022 Chronic systolic (congestive) heart failure: Secondary | ICD-10-CM

## 2022-12-26 DIAGNOSIS — I700 Atherosclerosis of aorta: Secondary | ICD-10-CM

## 2022-12-26 MED ORDER — TORSEMIDE 20 MG PO TABS: 80.0000 mg | ORAL_TABLET | Freq: Every day | ORAL | 5 refills | Status: DC

## 2022-12-26 NOTE — Telephone Encounter (Signed)
Pt aware.

## 2022-12-26 NOTE — Telephone Encounter (Signed)
Order placed -LMOM to discuss result with patient

## 2022-12-26 NOTE — Telephone Encounter (Signed)
-----   Message from Alen Bleacher sent at 12/25/2022  1:29 PM EDT ----- Sodium low, restrict free water. Renal function elevated, suspect 2/2 recent increased diuretics, repeat BMET 10 days. Continue increased torsemide dose for the next two days then decrease to 80 mg daily.  Iron panel still low, can arrange for more IV iron

## 2022-12-26 NOTE — Telephone Encounter (Signed)
-----   Message from Tiger sent at 12/26/2022  4:20 PM EDT ----- Regarding: Refer to Structural for TAVR Work-Up Echo shows that his aortic stenosis is severe. Please refer to structural heart clinic for possible TAVR

## 2022-12-29 ENCOUNTER — Telehealth (HOSPITAL_COMMUNITY): Payer: Self-pay

## 2022-12-29 NOTE — Telephone Encounter (Signed)
Spoke with daughter and she will talk with patient tonight and see if this is something he wants to do. She will message the office tomorrow and let us know

## 2022-12-29 NOTE — Telephone Encounter (Signed)
Spoke with daughter and she [place] going to discuss it with them to see if he wants it

## 2023-01-01 ENCOUNTER — Encounter (HOSPITAL_COMMUNITY): Payer: Self-pay | Admitting: Emergency Medicine

## 2023-01-01 ENCOUNTER — Emergency Department (HOSPITAL_COMMUNITY): Payer: Medicare HMO

## 2023-01-01 ENCOUNTER — Inpatient Hospital Stay (HOSPITAL_COMMUNITY)
Admission: EM | Admit: 2023-01-01 | Discharge: 2023-01-03 | Disposition: A | Discharge status: Home Health | Payer: Medicare HMO | Attending: Internal Medicine | Admitting: Internal Medicine

## 2023-01-01 ENCOUNTER — Other Ambulatory Visit: Payer: Self-pay

## 2023-01-01 ENCOUNTER — Encounter (HOSPITAL_COMMUNITY): Payer: Self-pay

## 2023-01-01 DIAGNOSIS — W19XXXA Unspecified fall, initial encounter: Secondary | ICD-10-CM

## 2023-01-01 DIAGNOSIS — I252 Old myocardial infarction: Secondary | ICD-10-CM

## 2023-01-01 DIAGNOSIS — W010XXA Fall on same level from slipping, tripping and stumbling without subsequent striking against object, initial encounter: Secondary | ICD-10-CM | POA: Diagnosis not present

## 2023-01-01 DIAGNOSIS — R7303 Prediabetes: Secondary | ICD-10-CM | POA: Diagnosis present

## 2023-01-01 DIAGNOSIS — G2581 Restless legs syndrome: Secondary | ICD-10-CM | POA: Diagnosis not present

## 2023-01-01 DIAGNOSIS — E8809 Other disorders of plasma-protein metabolism, not elsewhere classified: Secondary | ICD-10-CM | POA: Diagnosis not present

## 2023-01-01 DIAGNOSIS — M25522 Pain in left elbow: Secondary | ICD-10-CM | POA: Diagnosis not present

## 2023-01-01 DIAGNOSIS — I5022 Chronic systolic (congestive) heart failure: Secondary | ICD-10-CM | POA: Diagnosis not present

## 2023-01-01 DIAGNOSIS — Z951 Presence of aortocoronary bypass graft: Secondary | ICD-10-CM

## 2023-01-01 DIAGNOSIS — J449 Chronic obstructive pulmonary disease, unspecified: Secondary | ICD-10-CM | POA: Diagnosis not present

## 2023-01-01 DIAGNOSIS — Z79899 Other long term (current) drug therapy: Secondary | ICD-10-CM | POA: Diagnosis not present

## 2023-01-01 DIAGNOSIS — I255 Ischemic cardiomyopathy: Secondary | ICD-10-CM | POA: Diagnosis present

## 2023-01-01 DIAGNOSIS — I25810 Atherosclerosis of coronary artery bypass graft(s) without angina pectoris: Secondary | ICD-10-CM | POA: Diagnosis not present

## 2023-01-01 DIAGNOSIS — Z8249 Family history of ischemic heart disease and other diseases of the circulatory system: Secondary | ICD-10-CM

## 2023-01-01 DIAGNOSIS — I480 Paroxysmal atrial fibrillation: Secondary | ICD-10-CM | POA: Diagnosis not present

## 2023-01-01 DIAGNOSIS — M7989 Other specified soft tissue disorders: Secondary | ICD-10-CM | POA: Diagnosis not present

## 2023-01-01 DIAGNOSIS — M4802 Spinal stenosis, cervical region: Secondary | ICD-10-CM | POA: Diagnosis present

## 2023-01-01 DIAGNOSIS — S199XXA Unspecified injury of neck, initial encounter: Secondary | ICD-10-CM | POA: Diagnosis not present

## 2023-01-01 DIAGNOSIS — N1832 Chronic kidney disease, stage 3b: Secondary | ICD-10-CM | POA: Diagnosis present

## 2023-01-01 DIAGNOSIS — Z952 Presence of prosthetic heart valve: Secondary | ICD-10-CM | POA: Diagnosis not present

## 2023-01-01 DIAGNOSIS — E785 Hyperlipidemia, unspecified: Secondary | ICD-10-CM | POA: Diagnosis present

## 2023-01-01 DIAGNOSIS — R17 Unspecified jaundice: Secondary | ICD-10-CM | POA: Diagnosis not present

## 2023-01-01 DIAGNOSIS — E871 Hypo-osmolality and hyponatremia: Secondary | ICD-10-CM | POA: Diagnosis not present

## 2023-01-01 DIAGNOSIS — I110 Hypertensive heart disease with heart failure: Secondary | ICD-10-CM | POA: Diagnosis not present

## 2023-01-01 DIAGNOSIS — Z7982 Long term (current) use of aspirin: Secondary | ICD-10-CM

## 2023-01-01 DIAGNOSIS — I5021 Acute systolic (congestive) heart failure: Secondary | ICD-10-CM | POA: Diagnosis not present

## 2023-01-01 DIAGNOSIS — I080 Rheumatic disorders of both mitral and aortic valves: Secondary | ICD-10-CM | POA: Diagnosis present

## 2023-01-01 DIAGNOSIS — I5023 Acute on chronic systolic (congestive) heart failure: Secondary | ICD-10-CM | POA: Diagnosis not present

## 2023-01-01 DIAGNOSIS — Y92009 Unspecified place in unspecified non-institutional (private) residence as the place of occurrence of the external cause: Secondary | ICD-10-CM | POA: Diagnosis not present

## 2023-01-01 DIAGNOSIS — I509 Heart failure, unspecified: Secondary | ICD-10-CM | POA: Diagnosis not present

## 2023-01-01 DIAGNOSIS — N183 Chronic kidney disease, stage 3 unspecified: Secondary | ICD-10-CM | POA: Diagnosis not present

## 2023-01-01 DIAGNOSIS — F419 Anxiety disorder, unspecified: Secondary | ICD-10-CM | POA: Diagnosis not present

## 2023-01-01 DIAGNOSIS — S0990XA Unspecified injury of head, initial encounter: Secondary | ICD-10-CM | POA: Diagnosis not present

## 2023-01-01 DIAGNOSIS — I700 Atherosclerosis of aorta: Secondary | ICD-10-CM | POA: Diagnosis not present

## 2023-01-01 DIAGNOSIS — Z87891 Personal history of nicotine dependence: Secondary | ICD-10-CM

## 2023-01-01 DIAGNOSIS — R0989 Other specified symptoms and signs involving the circulatory and respiratory systems: Secondary | ICD-10-CM | POA: Diagnosis not present

## 2023-01-01 DIAGNOSIS — I2510 Atherosclerotic heart disease of native coronary artery without angina pectoris: Secondary | ICD-10-CM | POA: Diagnosis not present

## 2023-01-01 DIAGNOSIS — N179 Acute kidney failure, unspecified: Secondary | ICD-10-CM | POA: Diagnosis not present

## 2023-01-01 DIAGNOSIS — J90 Pleural effusion, not elsewhere classified: Secondary | ICD-10-CM | POA: Diagnosis not present

## 2023-01-01 DIAGNOSIS — Z7951 Long term (current) use of inhaled steroids: Secondary | ICD-10-CM

## 2023-01-01 DIAGNOSIS — D696 Thrombocytopenia, unspecified: Secondary | ICD-10-CM | POA: Diagnosis present

## 2023-01-01 DIAGNOSIS — H409 Unspecified glaucoma: Secondary | ICD-10-CM | POA: Diagnosis present

## 2023-01-01 DIAGNOSIS — K219 Gastro-esophageal reflux disease without esophagitis: Secondary | ICD-10-CM | POA: Diagnosis present

## 2023-01-01 DIAGNOSIS — Z953 Presence of xenogenic heart valve: Secondary | ICD-10-CM | POA: Diagnosis not present

## 2023-01-01 LAB — BASIC METABOLIC PANEL
Anion gap: 14 (ref 5–15)
BUN: 71 mg/dL — ABNORMAL HIGH (ref 8–23)
CO2: 23 mmol/L (ref 22–32)
Calcium: 9.1 mg/dL (ref 8.9–10.3)
Chloride: 92 mmol/L — ABNORMAL LOW (ref 98–111)
Creatinine, Ser: 2.71 mg/dL — ABNORMAL HIGH (ref 0.61–1.24)
GFR, Estimated: 24 mL/min — ABNORMAL LOW (ref >60)
Glucose, Bld: 92 mg/dL (ref 70–99)
Potassium: 4.9 mmol/L (ref 3.5–5.1)
Sodium: 129 mmol/L — ABNORMAL LOW (ref 135–145)

## 2023-01-01 LAB — CBC
HCT: 37.6 % — ABNORMAL LOW (ref 39.0–52.0)
Hemoglobin: 13.1 g/dL (ref 13.0–17.0)
MCH: 30.2 pg (ref 26.0–34.0)
MCHC: 34.8 g/dL (ref 30.0–36.0)
MCV: 86.6 fL (ref 80.0–100.0)
Platelets: 126 10*3/uL — ABNORMAL LOW (ref 150–400)
RBC: 4.34 MIL/uL (ref 4.22–5.81)
RDW: 15.8 % — ABNORMAL HIGH (ref 11.5–15.5)
WBC: 6.7 10*3/uL (ref 4.0–10.5)
nRBC: 0 % (ref 0.0–0.2)

## 2023-01-01 LAB — BRAIN NATRIURETIC PEPTIDE: B Natriuretic Peptide: 1924.8 pg/mL — ABNORMAL HIGH (ref 0.0–100.0)

## 2023-01-01 LAB — TROPONIN I (HIGH SENSITIVITY)
Troponin I (High Sensitivity): 43 ng/L — ABNORMAL HIGH (ref <18)
Troponin I (High Sensitivity): 41 ng/L — ABNORMAL HIGH (ref <18)

## 2023-01-01 LAB — PROTIME-INR
INR: 3.6 — ABNORMAL HIGH (ref 0.8–1.2)
Prothrombin Time: 36.2 seconds — ABNORMAL HIGH (ref 11.4–15.2)

## 2023-01-01 MED ORDER — AMIODARONE HCL 200 MG PO TABS: 100.0000 mg | ORAL_TABLET | Freq: Every day | ORAL | Status: DC

## 2023-01-01 MED ORDER — AMIODARONE HCL 100 MG PO TABS
100.0000 mg | ORAL_TABLET | Freq: Every day | ORAL | Status: DC
  Administered 2023-01-02 – 2023-01-03 (×2): 100 mg via ORAL
  Filled 2023-01-01 (×2): qty 1

## 2023-01-01 MED ORDER — ACETAMINOPHEN 325 MG PO TABS: 650.0000 mg | ORAL_TABLET | ORAL | Status: DC | PRN

## 2023-01-01 MED ORDER — LATANOPROST 0.005 % OP SOLN
1.0000 [drp] | Freq: Every day | OPHTHALMIC | Status: DC
  Administered 2023-01-01 – 2023-01-02 (×2): 1 [drp] via OPHTHALMIC
  Filled 2023-01-01: qty 2.5

## 2023-01-01 MED ORDER — TORSEMIDE 20 MG PO TABS: 80.0000 mg | ORAL_TABLET | Freq: Every day | ORAL | Status: DC

## 2023-01-01 MED ORDER — NITROGLYCERIN 0.4 MG SL SUBL: 0.4000 mg | SUBLINGUAL_TABLET | SUBLINGUAL | Status: DC | PRN

## 2023-01-01 MED ORDER — ESCITALOPRAM OXALATE 10 MG PO TABS
10.0000 mg | ORAL_TABLET | Freq: Every day | ORAL | Status: DC
  Administered 2023-01-02 – 2023-01-03 (×2): 10 mg via ORAL
  Filled 2023-01-01 (×2): qty 1

## 2023-01-01 MED ORDER — GABAPENTIN 300 MG PO CAPS
600.0000 mg | ORAL_CAPSULE | Freq: Every day | ORAL | Status: DC
  Administered 2023-01-01 – 2023-01-02 (×2): 600 mg via ORAL
  Filled 2023-01-01 (×2): qty 2

## 2023-01-01 MED ORDER — DAPAGLIFLOZIN PROPANEDIOL 10 MG PO TABS
10.0000 mg | ORAL_TABLET | Freq: Every day | ORAL | Status: DC
  Administered 2023-01-02 – 2023-01-03 (×2): 10 mg via ORAL
  Filled 2023-01-01 (×2): qty 1

## 2023-01-01 MED ORDER — ASPIRIN 81 MG PO TBEC
81.0000 mg | DELAYED_RELEASE_TABLET | Freq: Every day | ORAL | Status: DC
  Administered 2023-01-02 – 2023-01-03 (×2): 81 mg via ORAL
  Filled 2023-01-01 (×2): qty 1

## 2023-01-01 MED ORDER — WARFARIN - PHARMACIST DOSING INPATIENT: Freq: Every day | Status: DC

## 2023-01-01 MED ORDER — MIDODRINE HCL 5 MG PO TABS
10.0000 mg | ORAL_TABLET | Freq: Three times a day (TID) | ORAL | Status: DC
  Administered 2023-01-01 – 2023-01-03 (×5): 10 mg via ORAL
  Filled 2023-01-01 (×5): qty 2

## 2023-01-01 MED ORDER — UMECLIDINIUM BROMIDE 62.5 MCG/ACT IN AEPB
1.0000 | INHALATION_SPRAY | Freq: Every day | RESPIRATORY_TRACT | Status: DC
  Administered 2023-01-01: 1 via RESPIRATORY_TRACT
  Filled 2023-01-01: qty 7

## 2023-01-01 MED ORDER — SODIUM CHLORIDE 0.9% FLUSH
3.0000 mL | Freq: Two times a day (BID) | INTRAVENOUS | Status: DC
  Administered 2023-01-01 – 2023-01-03 (×4): 3 mL via INTRAVENOUS

## 2023-01-01 MED ORDER — MIDODRINE HCL 5 MG PO TABS: 10.0000 mg | ORAL_TABLET | Freq: Three times a day (TID) | ORAL | Status: DC

## 2023-01-01 MED ORDER — ESCITALOPRAM OXALATE 10 MG PO TABS: 10.0000 mg | ORAL_TABLET | Freq: Every day | ORAL | Status: DC

## 2023-01-01 MED ORDER — SODIUM CHLORIDE 0.9% FLUSH: 3.0000 mL | INTRAVENOUS | Status: DC | PRN

## 2023-01-01 MED ORDER — ASPIRIN 81 MG PO TBEC: 81.0000 mg | DELAYED_RELEASE_TABLET | Freq: Every day | ORAL | Status: DC

## 2023-01-01 MED ORDER — TORSEMIDE 20 MG PO TABS
80.0000 mg | ORAL_TABLET | Freq: Every day | ORAL | Status: DC
  Administered 2023-01-02 – 2023-01-03 (×2): 80 mg via ORAL
  Filled 2023-01-01 (×2): qty 4

## 2023-01-01 MED ORDER — ATORVASTATIN CALCIUM 80 MG PO TABS
80.0000 mg | ORAL_TABLET | Freq: Every day | ORAL | Status: DC
  Administered 2023-01-02 – 2023-01-03 (×2): 80 mg via ORAL
  Filled 2023-01-01 (×2): qty 1

## 2023-01-01 MED ORDER — FERROUS SULFATE 325 (65 FE) MG PO TABS
325.0000 mg | ORAL_TABLET | Freq: Every day | ORAL | Status: DC
  Administered 2023-01-02 – 2023-01-03 (×2): 325 mg via ORAL
  Filled 2023-01-01 (×2): qty 1

## 2023-01-01 MED ORDER — EZETIMIBE 10 MG PO TABS
5.0000 mg | ORAL_TABLET | Freq: Every day | ORAL | Status: DC
  Administered 2023-01-02 – 2023-01-03 (×2): 5 mg via ORAL
  Filled 2023-01-01 (×2): qty 1

## 2023-01-01 MED ORDER — PANTOPRAZOLE SODIUM 40 MG PO TBEC: 40.0000 mg | DELAYED_RELEASE_TABLET | Freq: Every day | ORAL | Status: DC

## 2023-01-01 MED ORDER — PANTOPRAZOLE SODIUM 40 MG PO TBEC
40.0000 mg | DELAYED_RELEASE_TABLET | Freq: Every day | ORAL | Status: DC
  Administered 2023-01-02 – 2023-01-03 (×2): 40 mg via ORAL
  Filled 2023-01-01 (×2): qty 1

## 2023-01-01 MED ORDER — ATORVASTATIN CALCIUM 80 MG PO TABS: 80.0000 mg | ORAL_TABLET | Freq: Every day | ORAL | Status: DC

## 2023-01-01 MED ORDER — MIDODRINE HCL 5 MG PO TABS
10.0000 mg | ORAL_TABLET | Freq: Once | ORAL | Status: AC
  Administered 2023-01-01: 10 mg via ORAL
  Filled 2023-01-01: qty 2

## 2023-01-01 MED ORDER — EZETIMIBE 10 MG PO TABS: 5.0000 mg | ORAL_TABLET | Freq: Every day | ORAL | Status: DC

## 2023-01-01 MED ORDER — CYANOCOBALAMIN 1000 MCG/ML IJ SOLN
1000.0000 ug | INTRAMUSCULAR | Status: DC
  Administered 2023-01-01: 1000 ug via INTRAMUSCULAR
  Filled 2023-01-01: qty 1

## 2023-01-01 MED ORDER — ALBUTEROL SULFATE HFA 108 (90 BASE) MCG/ACT IN AERS: 2.0000 | INHALATION_SPRAY | Freq: Four times a day (QID) | RESPIRATORY_TRACT | Status: DC | PRN

## 2023-01-01 MED ORDER — ALBUTEROL SULFATE (2.5 MG/3ML) 0.083% IN NEBU: 3.0000 mL | INHALATION_SOLUTION | Freq: Four times a day (QID) | RESPIRATORY_TRACT | Status: DC | PRN

## 2023-01-01 MED ORDER — DAPAGLIFLOZIN PROPANEDIOL 10 MG PO TABS: 10.0000 mg | ORAL_TABLET | Freq: Every day | ORAL | Status: DC

## 2023-01-01 MED ORDER — SODIUM CHLORIDE 0.9 % IV SOLN: 250.0000 mL | INTRAVENOUS | Status: DC | PRN

## 2023-01-01 MED ORDER — FUROSEMIDE 10 MG/ML IJ SOLN
40.0000 mg | Freq: Two times a day (BID) | INTRAMUSCULAR | Status: DC
  Administered 2023-01-01 – 2023-01-02 (×3): 40 mg via INTRAVENOUS
  Filled 2023-01-01 (×4): qty 4

## 2023-01-01 MED ORDER — SPIRONOLACTONE 25 MG PO TABS
25.0000 mg | ORAL_TABLET | Freq: Every day | ORAL | Status: DC
  Administered 2023-01-02 – 2023-01-03 (×2): 25 mg via ORAL
  Filled 2023-01-01 (×2): qty 1

## 2023-01-01 MED ORDER — ONDANSETRON HCL 4 MG/2ML IJ SOLN: 4.0000 mg | Freq: Four times a day (QID) | INTRAMUSCULAR | Status: DC | PRN

## 2023-01-01 MED ORDER — SPIRONOLACTONE 25 MG PO TABS: 25.0000 mg | ORAL_TABLET | Freq: Every day | ORAL | Status: DC

## 2023-01-01 MED ORDER — FLUTICASONE FUROATE-VILANTEROL 100-25 MCG/ACT IN AEPB
1.0000 | INHALATION_SPRAY | Freq: Every day | RESPIRATORY_TRACT | Status: DC
  Administered 2023-01-01: 1 via RESPIRATORY_TRACT
  Filled 2023-01-01 (×2): qty 28

## 2023-01-01 NOTE — ED Triage Notes (Signed)
Pt presents POV with family for increased weakness and anxiety. PT was out in the heat and lost balance and feel onto left elbow. Per daughter pt has been abnormally anxious and confused today. Pt did not hit head yesterday but is on blood thinners. PT is alert and oriented x4

## 2023-01-01 NOTE — Telephone Encounter (Signed)
Pre cert pending and team will reach out to patient once arranged.
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
## 2023-01-01 NOTE — ED Notes (Signed)
Pt transferred to ED 42 and received for care at this time.  This RN introduced self to pt and family at bedside.  Bed in lowest position, wheels locked.  Call bell within reach.

## 2023-01-01 NOTE — ED Notes (Signed)
Wound irrigated and dressing placed on skin tear on L elbow.

## 2023-01-01 NOTE — Progress Notes (Signed)
ANTICOAGULATION CONSULT NOTE - Initial Consult  Pharmacy Consult for Warfarin Indication: atrial fibrillation  No Known Allergies  Patient Measurements: Weight: 83.5 kg (184 lb)  Vital Signs: Temp: 98 F (36.7 C) (07/25 1636) Temp Source: Oral (07/25 1636) BP: 116/79 (07/25 1630) Pulse Rate: 57 (07/25 1630)  Labs: Recent Labs    01/01/23 1133 01/01/23 1437 01/01/23 1627  HGB 13.1  --   --   HCT 37.6*  --   --   PLT 126*  --   --   LABPROT  --  36.2*  --   INR  --  3.6*  --   CREATININE 2.71*  --   --   TROPONINIHS  --  43* 41*    Estimated Creatinine Clearance: 26.6 mL/min (A) (by C-G formula based on SCr of 2.71 mg/dL (H)).   Medical History: Past Medical History:  Diagnosis Date   Aortic atherosclerosis (HCC)    Arthritis    CHF (congestive heart failure) (HCC)    Esophageal stricture    Glaucoma    MI (myocardial infarction) (HCC)    Non-ST elevation (NSTEMI) myocardial infarction (HCC) 10/10/2021   NSTEMI (non-ST elevated myocardial infarction) (HCC) 10/10/2021   Osteoarthritis    Severe mitral regurgitation    Stricture and stenosis of esophagus    Assessment: 71 YOM with a history of CHF, aortic atherosclerosis, CABG 2 vessel & mitral valve replacement s/p STEMI, severe mitral regurgitation, esophageal strictures. Patient is presenting with increased weakness, confusion, and anxiety. Warfarin per pharmacy consult placed for atrial fibrillation.  Patient taking warfarin prior to arrival. Home dose is 3 mg (1 mg tablet x3). Last taken 07/24 1800.  PT / INR today is 36.2 / 3.6, which is supratherapeutic Hgb 13.1; plt 126 No signs of bleeding   Goal of Therapy:  INR Goal 2.0-3.0 Monitor platelets by anticoagulation protocol: Yes   Plan:  Hold warfarin home dose today given supratherapuetic INR Check INR daily  Monitor H/H, plts, and for signs of bleeding   Laqueta Jean PharmD Candidate 01/01/2023 7:53 PM

## 2023-01-01 NOTE — ED Notes (Signed)
X-ray at bedside
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
## 2023-01-02 ENCOUNTER — Encounter (HOSPITAL_COMMUNITY): Payer: Self-pay | Admitting: Internal Medicine

## 2023-01-02 DIAGNOSIS — W19XXXA Unspecified fall, initial encounter: Secondary | ICD-10-CM | POA: Diagnosis not present

## 2023-01-02 DIAGNOSIS — E871 Hypo-osmolality and hyponatremia: Secondary | ICD-10-CM

## 2023-01-02 DIAGNOSIS — I5021 Acute systolic (congestive) heart failure: Secondary | ICD-10-CM

## 2023-01-02 DIAGNOSIS — Z952 Presence of prosthetic heart valve: Secondary | ICD-10-CM | POA: Diagnosis not present

## 2023-01-02 DIAGNOSIS — I5022 Chronic systolic (congestive) heart failure: Secondary | ICD-10-CM

## 2023-01-02 DIAGNOSIS — I480 Paroxysmal atrial fibrillation: Secondary | ICD-10-CM | POA: Diagnosis not present

## 2023-01-02 DIAGNOSIS — N1832 Chronic kidney disease, stage 3b: Secondary | ICD-10-CM | POA: Diagnosis not present

## 2023-01-02 DIAGNOSIS — E785 Hyperlipidemia, unspecified: Secondary | ICD-10-CM

## 2023-01-02 DIAGNOSIS — Y92009 Unspecified place in unspecified non-institutional (private) residence as the place of occurrence of the external cause: Secondary | ICD-10-CM

## 2023-01-02 DIAGNOSIS — I25810 Atherosclerosis of coronary artery bypass graft(s) without angina pectoris: Secondary | ICD-10-CM

## 2023-01-02 LAB — URINALYSIS, ROUTINE W REFLEX MICROSCOPIC: Ketones, ur: NEGATIVE mg/dL

## 2023-01-02 MED ORDER — MELATONIN 5 MG PO TABS
5.0000 mg | ORAL_TABLET | Freq: Every evening | ORAL | Status: DC | PRN
  Administered 2023-01-02: 5 mg via ORAL
  Filled 2023-01-02: qty 1

## 2023-01-02 NOTE — Evaluation (Signed)
Occupational Therapy Evaluation Patient Details Name: Keith Jenkins MRN: [id number] DOB: [birth year] Today's Date: 01/02/2023   History of Present Illness Pt is a 77 y.o. male admitted 01/01/23 after a fall and increased confusion. Dx with increased pulmonary edema and appears to have worsening CHF/exacerbation. PMH Aortic atherosclerosis, Arthritis, CHF, Esophageal stricture, Glaucoma, MI (10/10/2021), NSTEMI (10/10/2021), Osteoarthritis, Severe mitral regurgitation, and Stricture and stenosis of esophagus. Facial fracture surgery; L heart cath (10/10/2021); Mitral valve replacement (11/01/2021); CABG (11/01/2021); MAZE (11/01/2021); TEE without cardioversion (N/A, 5/8, 11/01/2021); RIGHT HEART CATH (03/17/2022); Thoracentesis (Bilateral, 03/18/2022); EGD (04/08/2022, 06/19/22)   Clinical Impression   Pt is typically mod I for mobility and ADL - he uses a SPC when out of the house. He does his own ADL but wife does most of the IADL. 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 Today he is overall min guard for UB and LB ADL from both seated and standing positions. He did get SOB with standing grooming at sink, but SpO2 was >90% on RA. He reports that overall his breathing has improved. OT will follow acutely to maximize independence in ADL and functional transfer but at this time no follow up is recommended. Next session bring energy conservation handout and continue OOB activity.       Recommendations for follow up therapy are one component of a multi-disciplinary discharge planning process, led by the attending physician.  Recommendations may be updated based on patient status, additional functional criteria and insurance authorization.   Assistance Recommended at Discharge Intermittent Supervision/Assistance  Patient can return home with the following A little help with walking and/or transfers;A little help with bathing/dressing/bathroom;Assistance with cooking/housework;Direct  supervision/assist for medications management;Direct supervision/assist for financial management;Help with stairs or ramp for entrance    Functional Status Assessment  Patient has had a recent decline in their functional status and demonstrates the ability to make significant improvements in function in a reasonable and predictable amount of time.  Equipment Recommendations  Tub/shower seat    Recommendations for Other Services PT consult     Precautions / Restrictions Restrictions Weight Bearing Restrictions: No      Mobility Bed Mobility Overal bed mobility: Needs Assistance Bed Mobility: Supine to Sit     Supine to sit: Supervision, HOB elevated          Transfers Overall transfer level: Needs assistance Equipment used: None Transfers: Sit to/from Stand Sit to Stand: Min guard           General transfer comment: guard for safety      Balance Overall balance assessment: History of Falls                                         ADL either performed or assessed with clinical judgement   ADL Overall ADL's : Needs assistance/impaired Eating/Feeding: Modified independent   Grooming: Oral care;Wash/dry face;Wash/dry hands;Min guard;Standing Grooming Details (indicate cue type and reason): sink level Upper Body Bathing: Min guard;Standing   Lower Body Bathing: Min guard;Sitting/lateral leans   Upper Body Dressing : Sitting;Supervision/safety   Lower Body Dressing: Min guard;Sitting/lateral leans;Sit to/from stand Lower Body Dressing Details (indicate cue type and reason): donning socks and managing briefs Toilet Transfer: Min guard;Ambulation   Toileting- Clothing Manipulation and Hygiene: Min guard;Sit to/from stand       Functional mobility during ADLs: Min guard;Cueing for safety (reaches for environmental supports)  General ADL Comments: suspect close to baseline     Vision Patient Visual Report: No change from baseline        Perception     Praxis      Pertinent Vitals/Pain Pain Assessment Pain Assessment: Faces Faces Pain Scale: Hurts a little bit Pain Location: L elbow Pain Descriptors / Indicators: Discomfort, Sore Pain Intervention(s): Monitored during session     Hand Dominance Right   Extremity/Trunk Assessment Upper Extremity Assessment Upper Extremity Assessment: LUE deficits/detail LUE Deficits / Details: bandaged elbow - saturated through and RN aware LUE Coordination: decreased gross motor   Lower Extremity Assessment Lower Extremity Assessment: Defer to PT evaluation   Cervical / Trunk Assessment Cervical / Trunk Assessment: Kyphotic   Communication Communication Communication: No difficulties   Cognition Arousal/Alertness: Awake/alert Behavior During Therapy: WFL for tasks assessed/performed Overall Cognitive Status: Impaired/Different from baseline Area of Impairment: Safety/judgement                         Safety/Judgement: Decreased awareness of safety, Decreased awareness of deficits     General Comments: wife present throughout session     General Comments  slight SOB while standing at the sink for ADL - SpO2 was >90% throughout on RA    Exercises     Shoulder Instructions      Home Living Family/patient expects to be discharged to:: Private residence Living Arrangements: Spouse/significant other Available Help at Discharge: Family;Available 24 hours/day Type of Home: House Home Access: Stairs to enter [employer] of Steps: 3 Entrance Stairs-Rails: None Home Layout: Two level;Able to live on main level with bedroom/bathroom Alternate Level Stairs-Number of Steps: 14 Alternate Level Stairs-Rails: Left Bathroom Shower/Tub: Chief Strategy Officer: Standard     Home Equipment: Cane - single point;Rollator (4 wheels)   Additional Comments: Pt retired TEFL teacher, 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 sweet tea      Prior  Functioning/Environment Prior Level of Function : Independent/Modified Independent             Mobility Comments: uses SPC for out in the yard, nothing in the house ADLs Comments: Performs ADLs and IADLs        OT Problem List: Decreased activity tolerance;Impaired balance (sitting and/or standing);Decreased safety awareness;Cardiopulmonary status limiting activity;Pain      OT Treatment/Interventions: Self-care/ADL training;Therapeutic exercise;Energy conservation;DME and/or AE instruction;Therapeutic activities;Patient/family education;Balance training    OT Goals(Current goals can be found in the care plan section) Acute Rehab OT Goals Patient Stated Goal: get home to his dog OT Goal Formulation: With patient Time For Goal Achievement: 01/16/23 Potential to Achieve Goals: Good ADL Goals Pt Will Perform Upper Body Dressing: with modified independence;standing Pt Will Perform Lower Body Dressing: with modified independence;sit to/from stand Pt Will Transfer to Toilet: with modified independence;ambulating Pt Will Perform Toileting - Clothing Manipulation and hygiene: with modified independence;sit to/from stand Additional ADL Goal #1: Pt will verbalize at least 3 ways of conserving energy during ADL with no cues  OT Frequency: Min 1X/week    Co-evaluation              AM-PAC OT "6 Clicks" Daily Activity     Outcome Measure Help from another person eating meals?: None Help from another person taking care of personal grooming?: A Little Help from another person toileting, which includes using toliet, bedpan, or urinal?: A Little Help from another person bathing (including washing, rinsing, drying)?: A Little Help from  another person to put on and taking off regular upper body clothing?: A Little Help from another person to put on and taking off regular lower body clothing?: A Little 6 Click Score: 19   End of Session Equipment Utilized During Treatment: Gait belt Nurse  Communication: Mobility status  Activity Tolerance: Patient tolerated treatment well Patient left: in chair;with call bell/phone within reach;with chair alarm set;with family/visitor present (MD in room)  OT Visit Diagnosis: Unsteadiness on feet (R26.81);Muscle weakness (generalized) (M62.81);History of falling (Z91.81);Pain Pain - Right/Left: Left Pain - part of body: Arm                Time: 1205-1228 OT Time Calculation (min): 23 min Charges:  OT Evaluation $OT Eval Low Complexity: 1 Low OT Treatments $Self Care/Home Management : 8-22 mins Nyoka Cowden OTR/L Acute Rehabilitation Services Office: [phone]   [facility] 01/02/2023, 2:43 PM

## 2023-01-02 NOTE — Progress Notes (Signed)
ANTICOAGULATION CONSULT NOTE - Initial Consult  Pharmacy Consult for Warfarin Indication: atrial fibrillation  No Known Allergies  Patient Measurements: Weight: 83.5 kg (184 lb)  Vital Signs: Temp: 97.6 F (36.4 C) (07/26 0650) Temp Source: Oral (07/26 0650) BP: 123/74 (07/26 0650) Pulse Rate: 61 (07/26 0650)  Labs: Recent Labs    01/01/23 1133 01/01/23 1437 01/01/23 1627 01/02/23 0219  HGB 13.1  --   --   --   HCT 37.6*  --   --   --   PLT 126*  --   --   --   LABPROT  --  36.2*  --  36.9*  INR  --  3.6*  --  3.7*  CREATININE 2.71*  --   --  2.28*  TROPONINIHS  --  43* 41*  --     Estimated Creatinine Clearance: 31.6 mL/min (A) (by C-G formula based on SCr of 2.28 mg/dL (H)).   Medical History: Past Medical History:  Diagnosis Date   Aortic atherosclerosis (HCC)    Arthritis    CHF (congestive heart failure) (HCC)    Esophageal stricture    Glaucoma    MI (myocardial infarction) (HCC)    Non-ST elevation (NSTEMI) myocardial infarction (HCC) 10/10/2021   NSTEMI (non-ST elevated myocardial infarction) (HCC) 10/10/2021   Osteoarthritis    Severe mitral regurgitation    Stricture and stenosis of esophagus    Assessment: 25 YOM with a history of CHF, aortic atherosclerosis, CABG 2 vessel & mitral valve replacement s/p STEMI, severe mitral regurgitation, esophageal strictures. Patient is presenting with increased weakness, confusion, and anxiety. Warfarin per pharmacy consult placed for atrial fibrillation.  Patient taking warfarin prior to arrival. Home dose is 3 mg (1 mg tablet x3). Last taken 07/24 1800.  INR this AM remains supratherapeutic at 3.7  Goal of Therapy:  INR Goal 2.0-3.0 Monitor platelets by anticoagulation protocol: Yes   Plan:  Hold warfarin today Daily INR, s/s bleeding  Daylene Posey, PharmD, [facility] Clinical Pharmacist ED Pharmacist Phone # [phone] 01/02/2023 8:04 AM

## 2023-01-02 NOTE — Hospital Course (Signed)
HPI per Dr. Earlie Lou on 01/01/23 Keith Jenkins is a 77 y.o. male with medical history significant of non-ST elevation MI, severe mitral regurgitation with repair, esophageal stricture, osteoarthritis, COPD, prediabetes, GERD, severe systolic dysfunction with last EF of 20 to 25% on December 25, 2022, history of hyperlipidemia who presented to the ER complaining of a fall.  Family said patient had a fall yesterday.  His sandals got caught on a rug and he fell forward.  He injured his left elbow.  Patient did not hit his head and no loss of consciousness.  Patient has been on warfarin.  Since then he felt dizzy.  He also has had increased confusion.  Patient was noted to have mild increase in shortness of breath.  Also confusion.  In the ER he was seen and evaluated.  Patient noted to have increased pulmonary edema and appears to have worsening CHF.  Patient is being admitted for further evaluation and treatment.   **Interim History Cardiology was consulted for further evaluation recommending continue diuresis and likely will change to oral diuresis in the AM.  PT OT recommending home health.  Assessment and Plan:  Status post fall -Appears mechanical.  Cannot rule out arrhythmias.   -Patient is EF of 20 to 25% makes him more susceptible to arrhythmias.  -No evidence of head injury or bleed as Head and Cervical Spine CT done and showed "No CT evidence of intracranial injury. No acute fracture or traumatic malalignment of the cervical spine. Severe bilateral neural foraminal stenosis at C5-C6, C6-C7." -Continue his warfarin and therefore.  -Urinalysis was unremarkable -PT OT to further evaluate and treat and they are recommending home health   Acute on chronic systolic dysfunction -Patient seems to be in CHF exacerbation.   -BP was elevated at 1924.8 and will repeat in the a.m. and continue diuresis.   -Continue other home regimen.  Change his diuretics to IV route. -Strict I's and O's and daily  weights  Intake/Output Summary (Last 24 hours) at 01/02/2023 2011 Last data filed at 01/02/2023 1856 Gross per 24 hour  Intake 711 ml  Output 1025 ml  Net -314 ml  -Cardiology consulted and diuresis per them; continue to monitor for signs and symptoms of volume overload   Hypervolemic hyponatremia -Probably secondary to CHF and fluid overload.   -C/w Diuresis and continue to Monitor Na+ Trend: Recent Labs  Lab 12/17/22 1245 12/19/22 1232 12/25/22 1118 01/01/23 1133 01/02/23 0219  NA 132* 133* 125* 129* 129*  -Repeat CMP in the AM   AKI on chronic kidney disease stage IIIb  -BUN/Cr worse than Baseline -BUN/Cr Trend: Recent Labs  Lab 12/17/22 1245 12/19/22 1232 12/25/22 1118 01/01/23 1133 01/02/23 0219  BUN 36* 42* 70* 71* 67*  CREATININE 1.97* 1.99* 2.71* 2.71* 2.28*  -C/w Diuresis  -Avoid Nephrotoxic Medications, Contrast Dyes, Hypotension and Dehydration to Ensure Adequate Renal Perfusion and will need to Renally Adjust Meds -Continue to Monitor and Trend Renal Function carefully and repeat CMP in the AM   Status post mitral valve replacement -Patient is on warfarin.   -Will continue warfarin per pharmacy protocol.  INR at 3.7 today   Paroxysmal atrial fibrillation -Rate is controlled.  Continue warfarin -Cardiology consulted and will continue to Monitor on Cardiac Telemetry   Hyperlipidemia -Continue statin

## 2023-01-02 NOTE — ED Notes (Signed)
This RN rounded on pt and noticed pt lying on floor asleep.  Pt woken up by this RN and asked if he had fallen.  Pt denies falling and states he intentionally laid on floor to help his back.  Pt assisted back onto stretcher and offered additional pillows for comfort.  No injuries noted.    Pt reminded to use call bell for assistance getting up.  Both side rails up; bed in lowest position with wheels locked.  Call bell within reach.
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
## 2023-01-02 NOTE — Consult Note (Addendum)
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 joints with joint space loss, subchondral sclerosis and osteophytosis. No definite elbow joint effusion. Potential minimal amount of soft tissue swelling about the posterior aspect of the elbow. No radiopaque foreign body. IMPRESSION: 1. Peripherally corticated ossicle adjacent to the tip of the olecranon process, favored to represent the sequela of remote avulsive injury though age-indeterminate  in the absence of prior examinations. Correlation for point tenderness at this location is advised. 2. Mild degenerative change involving the radial humeral and ulnar humeral joints. Electronically Signed   By: Simonne Come M.D.   On: 01/01/2023 14:46     Assessment and Plan:   Chronic Systolic Heart Failure  Ischemic cardiomyopathy  - Echocardiogram from 12/25/22 showed EF 20-25% with regional wall motion abnormalities, mildly reduced RV systolic function, severe low flow/low gradient AS - Patient is followed by advanced heart failure- not a candidate for VAD. Also, patient declined VAD and other advanced therapies in the past  - Now presented after a mechanical  fall and there were concerns for CHF exacerbation. BNP elevated to 1924. CXR with unchanged cardiomegaly and chronic pulmonary venous congestion without frank evidence of edema  - Hospitalist started IV lasix 40 mg BID- put out 0.63 L urine so far today, output not recorded yesterday. His weight was 184 lbs on admission and is down to 179.68 today. Creatinine was 2.7 yesterday, improved to 2.28 today  - He appears close to euvolemic on exam today. Plan to give one more dose of IV lasix this evening and then resume home torsemide tomorrow  - BP stable- last recorded as 113/75  - Continue farxiga 10 mg daily, midodrine 10 mg TID, spironolactone 25 mg daily. Note, digoxin recently stopped as an outpatient due to to elevated digoxin levels.  - Patient has not been on entreso due to renal function and need for midodrine for BP support. Not on BB given low BP and bradycardia   Elevated Troponin  - hsTn 43>41. Not consistent with ACS - Patient denies chest pain  - Suspect this is demand ischemia in the setting of volume overload. No plans for ischemic evaluation at this time   CAD  - in 10/2021, patient had a late presenting inferior STEMI. Cath showed 100% stenosis in mid RCA and 90% stenosis in mid LAD. RCA was managed medically because he had completed an infarct.  - He underwent CAGBx2 (LIMA-LAD, SVG-Ramus INT) in 10/2021 - He is chest pain free  - Continue ASA, atorvastatin 80 mg daily   Severe low flow/low gradient AS - Noted on echocardiogram from 12/25/22  - Patient was referred to structural heart for TAVR discussion. Has an appointment with Dr. Clifton James on 7/29. Patient is open to discussing TAVR   Mitral Regurgitation  - S/p biprosthetic MVR in 11/02/21 - On coumadin- followed by [facility] as an outpatient. Coumadin dosed per pharmacy as an inpatient   PAF - S/p MAZE with CABG in 2023 - Continue amiodarone 100 mg daily  - On coumadin per pharmcy - INR goal  2.0-3.0  Fatigue  - Patient and his family report that he has been not sleeping well for the past month and has been very tired.  - He is scheduled for an iron transfusion as an outpatient   Risk Assessment/Risk Scores:   New York Heart Association (NYHA) Functional Class NYHA Class IV      For questions or updates, please contact [facility] Please consult [email or web address] for contact info under    Signed, Jonita Albee, PA-C  01/02/2023 12:29 PM  I have examined the patient and reviewed assessment and plan and discussed with patient.  Agree with above as stated.    He has responded well to diuresis.  His leg swelling has improved significantly.  Creatinine is improving.  Hopefully, these trends will continue.  1 more  dose of IV diuretic tonight.  Switch back to oral tomorrow with likely discharge.  He has follow-up with Dr. Clifton James to discuss aortic valve issues on Monday.  Lance Muss

## 2023-01-02 NOTE — ED Notes (Signed)
Pt ambulatory to restroom using cane with this RN.

## 2023-01-02 NOTE — Progress Notes (Signed)
PROGRESS NOTE    Keith Jenkins  HYQ:[id number] DOB: 08-04-1945 DOA: 01/01/2023 PCP: Blane Ohara, MD   Brief Narrative:  HPI per Dr. Earlie Lou on 01/01/23 Keith Jenkins is a 77 y.o. male with medical history significant of non-ST elevation MI, severe mitral regurgitation with repair, esophageal stricture, osteoarthritis, COPD, prediabetes, GERD, severe systolic dysfunction with last EF of 20 to 25% on December 25, 2022, history of hyperlipidemia who presented to the ER complaining of a fall.  Family said patient had a fall yesterday.  His sandals got caught on a rug and he fell forward.  He injured his left elbow.  Patient did not hit his head and no loss of consciousness.  Patient has been on warfarin.  Since then he felt dizzy.  He also has had increased confusion.  Patient was noted to have mild increase in shortness of breath.  Also confusion.  In the ER he was seen and evaluated.  Patient noted to have increased pulmonary edema and appears to have worsening CHF.  Patient is being admitted for further evaluation and treatment.   **Interim History Cardiology was consulted for further evaluation recommending continue diuresis and likely will change to oral diuresis in the AM.  PT OT recommending home health.  Assessment and Plan:  Status post fall -Appears mechanical.  Cannot rule out arrhythmias.   -Patient is EF of 20 to 25% makes him more susceptible to arrhythmias.  -No evidence of head injury or bleed as Head and Cervical Spine CT done and showed "No CT evidence of intracranial injury. No acute fracture or traumatic malalignment of the cervical spine. Severe bilateral neural foraminal stenosis at C5-C6, C6-C7." -Continue his warfarin and therefore.  -Urinalysis was unremarkable -PT OT to further evaluate and treat and they are recommending home health   Acute on chronic systolic dysfunction -Patient seems to be in CHF exacerbation.   -BP was elevated at 1924.8 and will repeat in the a.m.  and continue diuresis.   -Continue other home regimen.  Change his diuretics to IV route. -Strict I's and O's and daily weights  Intake/Output Summary (Last 24 hours) at 01/02/2023 2011 Last data filed at 01/02/2023 1856 Gross per 24 hour  Intake 711 ml  Output 1025 ml  Net -314 ml  -Cardiology consulted and diuresis per them; continue to monitor for signs and symptoms of volume overload   Hypervolemic hyponatremia -Probably secondary to CHF and fluid overload.   -C/w Diuresis and continue to Monitor Na+ Trend: Recent Labs  Lab 12/17/22 1245 12/19/22 1232 12/25/22 1118 01/01/23 1133 01/02/23 0219  NA 132* 133* 125* 129* 129*  -Repeat CMP in the AM   AKI on chronic kidney disease stage IIIb  -BUN/Cr worse than Baseline -BUN/Cr Trend: Recent Labs  Lab 12/17/22 1245 12/19/22 1232 12/25/22 1118 01/01/23 1133 01/02/23 0219  BUN 36* 42* 70* 71* 67*  CREATININE 1.97* 1.99* 2.71* 2.71* 2.28*  -C/w Diuresis  -Avoid Nephrotoxic Medications, Contrast Dyes, Hypotension and Dehydration to Ensure Adequate Renal Perfusion and will need to Renally Adjust Meds -Continue to Monitor and Trend Renal Function carefully and repeat CMP in the AM   Status post mitral valve replacement -Patient is on warfarin.   -Will continue warfarin per pharmacy protocol.  INR at 3.7 today   Paroxysmal atrial fibrillation -Rate is controlled.  Continue warfarin -Cardiology consulted and will continue to Monitor on Cardiac Telemetry   Hyperlipidemia -Continue statin   DVT prophylaxis: Anticoagulated with Warfarin    Code Status: Full  Code Family Communication: No family present at bedside  Disposition Plan:  Level of care: Telemetry Cardiac Status is: Inpatient Remains inpatient appropriate because: Will continue diuresis to dry weight and follow-up on cardiology recommendations and discharge once closer to being euvolemic   Consultants:  Cardiology  Procedures:  As delineated as  above  Antimicrobials:  Anti-infectives (From admission, onward)    None       Subjective: Seen and examined at bedside and he denies being short of breath and states that he fell.  Denies any nausea or vomiting.  No other concerns requested at this time but states that he needs to urinate very quickly  Objective: Vitals:   01/02/23 0857 01/02/23 0907 01/02/23 1120 01/02/23 1645  BP:  120/88 113/75 (!) 101/58  Pulse:  (!) 54 (!) 49 (!) 52  Resp:  19 18 18   Temp:  97.6 F (36.4 C) (!) 97.5 F (36.4 C) (!) 97.5 F (36.4 C)  TempSrc:  Oral Oral Oral  SpO2:  100% 98% 98%  Weight: 81.5 kg     Height: 6\' 1"  (1.854 m)       Intake/Output Summary (Last 24 hours) at 01/02/2023 2014 Last data filed at 01/02/2023 1856 Gross per 24 hour  Intake 711 ml  Output 1025 ml  Net -314 ml   Filed Weights   01/01/23 1131 01/02/23 0857  Weight: 83.5 kg 81.5 kg   Examination: Physical Exam:  Constitutional: Chronically ill-appearing elderly Caucasian male who appears in no acute distress Respiratory: Diminished to auscultation bilaterally, no wheezing, rales, rhonchi or crackles. Normal respiratory effort and patient is not tachypenic. No accessory muscle use.  Unlabored breathing Cardiovascular: RRR, no murmurs / rubs / gallops. S1 and S2 auscultated.  Has mild 1+ extremity edema Abdomen: Soft, non-tender, non-distended. Bowel sounds positive.  GU: Deferred. Musculoskeletal: No clubbing / cyanosis of digits/nails. No joint deformity upper and lower extremities.  Skin: No rashes, lesions, ulcers on limited skin evaluation. No induration; Warm and dry.  Neurologic: CN 2-12 grossly intact with no focal deficits. Romberg sign and cerebellar reflexes not assessed.  Psychiatric: Normal judgment and insight. Alert and oriented x 3. Normal mood and appropriate affect.   Data Reviewed: I have personally reviewed following labs and imaging studies  CBC: Recent Labs  Lab 01/01/23 1133  WBC 6.7   HGB 13.1  HCT 37.6*  MCV 86.6  PLT 126*   Basic Metabolic Panel: Recent Labs  Lab 01/01/23 1133 01/02/23 0219  NA 129* 129*  K 4.9 4.1  CL 92* 95*  CO2 23 22  GLUCOSE 92 88  BUN 71* 67*  CREATININE 2.71* 2.28*  CALCIUM 9.1 9.1   GFR: Estimated Creatinine Clearance: 31.6 mL/min (A) (by C-G formula based on SCr of 2.28 mg/dL (H)). Liver Function Tests: No results for input(s): "AST", "ALT", "ALKPHOS", "BILITOT", "PROT", "ALBUMIN" in the last 168 hours. No results for input(s): "LIPASE", "AMYLASE" in the last 168 hours. No results for input(s): "AMMONIA" in the last 168 hours. Coagulation Profile: Recent Labs  Lab 01/01/23 1437 01/02/23 0219  INR 3.6* 3.7*   Cardiac Enzymes: No results for input(s): "CKTOTAL", "CKMB", "CKMBINDEX", "TROPONINI" in the last 168 hours. BNP (last 3 results) No results for input(s): "PROBNP" in the last 8760 hours. HbA1C: No results for input(s): "HGBA1C" in the last 72 hours. CBG: No results for input(s): "GLUCAP" in the last 168 hours. Lipid Profile: No results for input(s): "CHOL", "HDL", "LDLCALC", "TRIG", "CHOLHDL", "LDLDIRECT" in the last 72 hours.  Thyroid Function Tests: No results for input(s): "TSH", "T4TOTAL", "FREET4", "T3FREE", "THYROIDAB" in the last 72 hours. Anemia Panel: No results for input(s): "VITAMINB12", "FOLATE", "FERRITIN", "TIBC", "IRON", "RETICCTPCT" in the last 72 hours. Sepsis Labs: No results for input(s): "PROCALCITON", "LATICACIDVEN" in the last 168 hours.  No results found for this or any previous visit (from the past 240 hour(s)).   Radiology Studies: CT Head Wo Contrast  Result Date: 01/01/2023 CLINICAL DATA:  Head trauma, minor (Age >= 65y); Neck trauma (Age >= 65y) EXAM: CT HEAD WITHOUT CONTRAST CT CERVICAL SPINE WITHOUT CONTRAST TECHNIQUE: Multidetector CT imaging of the head and cervical spine was performed following the standard protocol without intravenous contrast. Multiplanar CT image  reconstructions of the cervical spine were also generated. RADIATION DOSE REDUCTION: This exam was performed according to the departmental dose-optimization program which includes automated exposure control, adjustment of the mA and/or kV according to patient size and/or use of iterative reconstruction technique. COMPARISON:  CT Head and C Spine 02/12/22 FINDINGS: CT HEAD FINDINGS Brain: No evidence of acute infarction, hemorrhage, hydrocephalus, extra-axial collection or mass lesion/mass effect. Is sequela of microvascular ischemic change. Vascular: No hyperdense vessel or unexpected calcification. Skull: Chronic right nasal bone fracture. There are likely additional posttraumatic changes around the right maxillary sinus. Sinuses/Orbits: No middle ear or mastoid effusion. Paranasal sinuses notable for mucosal thickening in the right maxillary sinus, which is completely opacified. Bilateral lens replacement. Orbits are otherwise unremarkable. Other: None. CT CERVICAL SPINE FINDINGS Alignment: Straightening of the normal cervical lordosis. Grade 1 anterolisthesis of C4 on C5. Skull base and vertebrae: No acute fracture. No primary bone lesion or focal pathologic process. Soft tissues and spinal canal: No evidence of high-grade spinal canal stenosis. Disc levels: No evidence of high-grade spinal canal stenosis. There is severe bilateral neural foraminal stenosis at C5-C6, C6-C7. Upper chest: Negative. Other: None IMPRESSION: 1. No CT evidence of intracranial injury. 2. No acute fracture or traumatic malalignment of the cervical spine. 3. Severe bilateral neural foraminal stenosis at C5-C6, C6-C7. Electronically Signed   By: Lorenza Cambridge M.D.   On: 01/01/2023 16:19   CT Cervical Spine Wo Contrast  Result Date: 01/01/2023 CLINICAL DATA:  Head trauma, minor (Age >= 65y); Neck trauma (Age >= 65y) EXAM: CT HEAD WITHOUT CONTRAST CT CERVICAL SPINE WITHOUT CONTRAST TECHNIQUE: Multidetector CT imaging of the head and  cervical spine was performed following the standard protocol without intravenous contrast. Multiplanar CT image reconstructions of the cervical spine were also generated. RADIATION DOSE REDUCTION: This exam was performed according to the departmental dose-optimization program which includes automated exposure control, adjustment of the mA and/or kV according to patient size and/or use of iterative reconstruction technique. COMPARISON:  CT Head and C Spine 02/12/22 FINDINGS: CT HEAD FINDINGS Brain: No evidence of acute infarction, hemorrhage, hydrocephalus, extra-axial collection or mass lesion/mass effect. Is sequela of microvascular ischemic change. Vascular: No hyperdense vessel or unexpected calcification. Skull: Chronic right nasal bone fracture. There are likely additional posttraumatic changes around the right maxillary sinus. Sinuses/Orbits: No middle ear or mastoid effusion. Paranasal sinuses notable for mucosal thickening in the right maxillary sinus, which is completely opacified. Bilateral lens replacement. Orbits are otherwise unremarkable. Other: None. CT CERVICAL SPINE FINDINGS Alignment: Straightening of the normal cervical lordosis. Grade 1 anterolisthesis of C4 on C5. Skull base and vertebrae: No acute fracture. No primary bone lesion or focal pathologic process. Soft tissues and spinal canal: No evidence of high-grade spinal canal stenosis. Disc levels: No evidence of high-grade spinal  canal stenosis. There is severe bilateral neural foraminal stenosis at C5-C6, C6-C7. Upper chest: Negative. Other: None IMPRESSION: 1. No CT evidence of intracranial injury. 2. No acute fracture or traumatic malalignment of the cervical spine. 3. Severe bilateral neural foraminal stenosis at C5-C6, C6-C7. Electronically Signed   By: Lorenza Cambridge M.D.   On: 01/01/2023 16:19   DG Chest 1 View  Result Date: 01/01/2023 CLINICAL DATA:  Congestive heart failure. Leg swelling and weakness. EXAM: CHEST  1 VIEW COMPARISON:   03/18/2022; chest CT-03/18/2022 FINDINGS: Unchanged enlarged cardiac silhouette and mediastinal contours post median sternotomy, valve replacement and left atrial appendix clipping. Chronic pulmonary venous congestion without frank evidence of edema. Unchanged chronic trace bilateral effusions. No new focal airspace opacities. No pneumothorax. No acute osseous abnormalities. IMPRESSION: 1. Unchanged cardiomegaly and chronic pulmonary venous congestion without frank evidence of edema. 2. Unchanged chronic trace bilateral effusions. Electronically Signed   By: Simonne Come M.D.   On: 01/01/2023 14:48   DG Elbow Complete Left  Result Date: 01/01/2023 CLINICAL DATA:  Post fall, now with left elbow pain. EXAM: LEFT ELBOW - COMPLETE 3+ VIEW COMPARISON:  None Available. FINDINGS: There is a peripherally corticated ossicle adjacent to the tip of the olecranon process, favored to represent the sequela of remote avulsive injury though age-indeterminate in the absence of prior examinations. Minimal enthesopathic change involving the olecranon process. Mild degenerative change involving the radial humeral and ulnar humeral joints with joint space loss, subchondral sclerosis and osteophytosis. No definite elbow joint effusion. Potential minimal amount of soft tissue swelling about the posterior aspect of the elbow. No radiopaque foreign body. IMPRESSION: 1. Peripherally corticated ossicle adjacent to the tip of the olecranon process, favored to represent the sequela of remote avulsive injury though age-indeterminate in the absence of prior examinations. Correlation for point tenderness at this location is advised. 2. Mild degenerative change involving the radial humeral and ulnar humeral joints. Electronically Signed   By: Simonne Come M.D.   On: 01/01/2023 14:46    Scheduled Meds:  amiodarone  100 mg Oral Daily   aspirin EC  81 mg Oral Daily   atorvastatin  80 mg Oral Daily   cyanocobalamin  1,000 mcg Intramuscular Q30  days   dapagliflozin propanediol  10 mg Oral Daily   escitalopram  10 mg Oral Daily   ezetimibe  5 mg Oral Daily   ferrous sulfate  325 mg Oral Daily   fluticasone furoate-vilanterol  1 puff Inhalation Daily   And   umeclidinium bromide  1 puff Inhalation Daily   furosemide  40 mg Intravenous BID   gabapentin  600 mg Oral QHS   latanoprost  1 drop Both Eyes QHS   midodrine  10 mg Oral TID   pantoprazole  40 mg Oral Daily   sodium chloride flush  3 mL Intravenous Q12H   spironolactone  25 mg Oral Daily   torsemide  80 mg Oral Daily   Warfarin - Pharmacist Dosing Inpatient   Does not apply q1600   Continuous Infusions:  sodium chloride      LOS: 1 day   Marguerita Merles, DO [facility] Available via Epic secure chat 7am-7pm After these hours, please refer to coverage provider listed on [email or web address] 01/02/2023, 8:14 PM

## 2023-01-02 NOTE — Plan of Care (Signed)
  Problem: Activity: Goal: Risk for activity intolerance will decrease Outcome: Progressing   Problem: Safety: Goal: Ability to remain free from injury will improve Outcome: Progressing   Problem: Skin Integrity: Goal: Risk for impaired skin integrity will decrease Outcome: Progressing   

## 2023-01-02 NOTE — Evaluation (Signed)
Physical Therapy Evaluation Patient Details Name: Keith Jenkins MRN: [id number] DOB: January 09, 1946 Today's Date: 01/02/2023  History of Present Illness  Pt is a 77 y.o. male admitted 01/01/23 after a fall and increased confusion. Dx with increased pulmonary edema and appears to have worsening CHF/exacerbation. PMH includes OA, CHF, Glaucoma, MI/NSTEMI s/p CABG, MR s/p Mitral valve replacement; MAZE.  Clinical Impression  Pt presents to PT with deficits in functional mobility, gait, balance, endurance, cognition. Pt is able to ambulate with [facility] for household distances without physical assistance. When attempting stair negotiation without railings the pt does lose balance laterally, however family reports that the pt typically holds onto the door or door frame with opposite extremity to improve stability. Pt will benefit from continued mobilization and balance training in an effort to reduce falls risk.      Assistance Recommended at Discharge Intermittent Supervision/Assistance  If plan is discharge home, recommend the following:  Can travel by private vehicle  Help with stairs or ramp for entrance;Assist for transportation;Assistance with cooking/housework        Equipment Recommendations None recommended by PT  Recommendations for Other Services       Functional Status Assessment Patient has had a recent decline in their functional status and demonstrates the ability to make significant improvements in function in a reasonable and predictable amount of time.     Precautions / Restrictions Precautions Precautions: Fall Restrictions Weight Bearing Restrictions: No      Mobility  Bed Mobility                    Transfers Overall transfer level: Needs assistance Equipment used: Straight cane Transfers: Sit to/from Stand Sit to Stand: Supervision                Ambulation/Gait Ambulation/Gait assistance: Supervision Gait Distance (Feet): 200 Feet Assistive  device: Straight cane Gait Pattern/deviations: Step-through pattern Gait velocity: reduced Gait velocity interpretation: <1.8 ft/sec, indicate of risk for recurrent falls   General Gait Details: slowed step-through gait  Stairs Stairs: Yes Stairs assistance: Min guard Stair Management: No rails, Step to pattern, Forwards Number of Stairs: 3 General stair comments: pt does lose balance ascending 3rd step and grabs railing for support to correct. PT provides hand hold when descending steps with improvement in stability  Wheelchair Mobility     Tilt Bed    Modified Rankin (Stroke Patients Only)       Balance Overall balance assessment: Needs assistance Sitting-balance support: Feet supported, No upper extremity supported Sitting balance-Leahy Scale: Good     Standing balance support: Single extremity supported, Reliant on assistive device for balance Standing balance-Leahy Scale: Poor                               Pertinent Vitals/Pain Pain Assessment Pain Assessment: No/denies pain    Home Living Family/patient expects to be discharged to:: Private residence Living Arrangements: Spouse/significant other Available Help at Discharge: Family;Available 24 hours/day Type of Home: House Home Access: Stairs to enter Entrance Stairs-Rails: None Entrance Stairs-Number of Steps: 3 Alternate Level Stairs-Number of Steps: 14 Home Layout: Two level;Able to live on main level with bedroom/bathroom Home Equipment: Gilmer Mor - single point;Rollator (4 wheels) Additional Comments: Pt retired TEFL teacher, loves his dog Sammy and sweet tea    Prior Function Prior Level of Function : Independent/Modified Independent  Mobility Comments: uses SPC for out in the yard, nothing in the house ADLs Comments: Performs ADLs and IADLs     Hand Dominance   Dominant Hand: Right    Extremity/Trunk Assessment   Upper Extremity Assessment Upper  Extremity Assessment: Defer to OT evaluation LUE Deficits / Details: bandaged elbow - saturated through and RN aware LUE Coordination: decreased gross motor    Lower Extremity Assessment Lower Extremity Assessment: Overall WFL for tasks assessed    Cervical / Trunk Assessment Cervical / Trunk Assessment: Kyphotic  Communication   Communication: HOH  Cognition Arousal/Alertness: Awake/alert Behavior During Therapy: WFL for tasks assessed/performed Overall Cognitive Status: Impaired/Different from baseline Area of Impairment: Memory, Problem solving                     Memory: Decreased short-term memory       Problem Solving: Slow processing          General Comments General comments (skin integrity, edema, etc.): VSS on RA    Exercises     Assessment/Plan    PT Assessment Patient needs continued PT services  PT Problem List Decreased balance;Decreased mobility;Decreased cognition       PT Treatment Interventions DME instruction;Gait training;Functional mobility training;Stair training;Therapeutic activities;Therapeutic exercise;Balance training;Patient/family education    PT Goals (Current goals can be found in the Care Plan section)  Acute Rehab PT Goals Patient Stated Goal: to go home PT Goal Formulation: With patient Time For Goal Achievement: 01/16/23 Potential to Achieve Goals: Good Additional Goals Additional Goal #1: Pt will score >19/24 on the DGI to indicate a reduced risk for falls    Frequency Min 1X/week     Co-evaluation               AM-PAC PT "6 Clicks" Mobility  Outcome Measure Help needed turning from your back to your side while in a flat bed without using bedrails?: A Little Help needed moving from lying on your back to sitting on the side of a flat bed without using bedrails?: A Little Help needed moving to and from a bed to a chair (including a wheelchair)?: A Little Help needed standing up from a chair using your arms  (e.g., wheelchair or bedside chair)?: A Little Help needed to walk in hospital room?: A Little Help needed climbing 3-5 steps with a railing? : A Little 6 Click Score: 18    End of Session   Activity Tolerance: Patient tolerated treatment well Patient left: in chair;with call bell/phone within reach;with chair alarm set Nurse Communication: Mobility status PT Visit Diagnosis: Other abnormalities of gait and mobility (R26.89)    Time: 8469-6295 PT Time Calculation (min) (ACUTE ONLY): 28 min   Charges:   PT Evaluation $PT Eval Low Complexity: 1 Low   PT General Charges $$ ACUTE PT VISIT: 1 Visit         Arlyss Gandy, PT, DPT Acute Rehabilitation Office [phone]   Arlyss Gandy 01/02/2023, 4:44 PM

## 2023-01-02 NOTE — Progress Notes (Signed)
Heart Failure Navigator Progress Note  Assessed for Heart & Vascular TOC clinic readiness.  Patient does not meet criteria due to Advanced Heart Failure Team patient.   Navigator will sign off at this time.    Dawn Fields, BSN, RN Heart Failure Nurse Navigator Secure Chat Only   

## 2023-01-02 NOTE — TOC Initial Note (Addendum)
Transition of Care [facility]) - Initial/Assessment Note    Patient Details  Name: Keith Jenkins MRN: [id number] Date of Birth: 1945/11/05  [facility]) CM/SW Contact:    Leone Haven, RN Phone Number: 01/02/2023, 11:47 AM  Clinical Narrative:                 NCM spoke with wife at the bedside, patient sleepy, wife states he lives with her and he has PCP, Dr. Burnett Kanaris, and has insurance on file.  She states he had a HHRN with [facility], but his insurance said he did not need it anymore per wife.  He has a wlker, bsc, neb machine and a cane at home.  She states her daughter or son will transport him home at dc.  Family is his support system.  [facility], pta he is self ambulatory with a cane. Await pt eval.  Expected Discharge Plan: Home/Self Care Barriers to Discharge: Continued Medical Work up   Patient Goals and CMS Choice Patient states their goals for this hospitalization and ongoing recovery are:: return home   Choice offered to / list presented to : NA      Expected Discharge Plan and Services In-house Referral: NA Discharge Planning Services: CM Consult Post Acute Care Choice: NA Living arrangements for the past 2 months: Single Family Home                 DME Arranged: N/A DME Agency: NA       HH Arranged: NA          Prior Living Arrangements/Services Living arrangements for the past 2 months: Single Family Home Lives with:: Spouse Patient language and need for interpreter reviewed:: Yes Do you feel safe going back to the place where you live?: Yes      Need for Family Participation in Patient Care: Yes (Comment) Care giver support system in place?: Yes (comment) Current home services: DME (walker, bsc, neb machine, cane) Criminal Activity/Legal Involvement Pertinent to Current Situation/Hospitalization: No - Comment as needed  Activities of Daily Living      Permission  Sought/Granted                  Emotional Assessment Appearance:: Appears stated age     Orientation: : Oriented to Self, Oriented to Place, Oriented to  Time, Oriented to Situation Alcohol / Substance Use: Not Applicable Psych Involvement: No (comment)  Admission diagnosis:  CHF (congestive heart failure) (HCC) [I50.9] Patient Active Problem List   Diagnosis Date Noted   CHF (congestive heart failure) (HCC) 01/01/2023   Hyponatremia 01/01/2023   Fall at home, initial encounter 01/01/2023   Hypotension 09/14/2022   CAD (coronary artery disease) 09/14/2022   B12 deficiency 09/14/2022   Paroxysmal atrial fibrillation (HCC) 09/14/2022   Chronic systolic heart failure (HCC) 09/12/2022   Prediabetes 04/14/2022   Atherosclerosis of aorta (HCC) 04/14/2022   Abnormal esophagram    Need for influenza vaccination 02/24/2022   Stage 3b chronic kidney disease (CKD) (HCC) 02/24/2022   Mitral valve replaced 11/26/2021   Esophageal dysphagia    S/P mitral valve replacement 11/01/2021   Pericardial effusion    Hyperlipidemia 10/10/2021   PCP:  Blane Ohara, MD Pharmacy:   [facility] - [address] - [place], [place] - [address] [place] [address] Phone: [phone] Fax: [phone]     Social Determinants of Health (SDOH) Social History: SDOH Screenings  Food Insecurity: Food Insecurity Present (07/04/2022)  Housing: Low Risk  (07/04/2022)  Transportation Needs: Unmet Transportation Needs (08/28/2022)  Utilities: Not At Risk (04/18/2022)  Alcohol Screen: Low Risk  (04/17/2022)  Depression (PHQ2-9): Low Risk  (04/14/2022)  Financial Resource Strain: High Risk (08/28/2022)  Physical Activity: Sufficiently Active (04/14/2022)  Social Connections: Moderately Integrated (04/17/2022)  Stress: No Stress Concern Present (04/14/2022)  Tobacco Use: Medium Risk (01/01/2023)   SDOH Interventions:     Readmission Risk Interventions    03/14/2022   11:02 AM   Readmission Risk Prevention Plan  Transportation Screening Complete  Medication Review (RN Care Manager) Complete  PCP or Specialist appointment within 3-5 days of discharge Complete  HRI or Home Care Consult Complete

## 2023-01-03 ENCOUNTER — Inpatient Hospital Stay (HOSPITAL_COMMUNITY): Payer: Medicare HMO

## 2023-01-03 DIAGNOSIS — I480 Paroxysmal atrial fibrillation: Secondary | ICD-10-CM | POA: Diagnosis not present

## 2023-01-03 DIAGNOSIS — N1832 Chronic kidney disease, stage 3b: Secondary | ICD-10-CM | POA: Diagnosis not present

## 2023-01-03 DIAGNOSIS — E871 Hypo-osmolality and hyponatremia: Secondary | ICD-10-CM | POA: Diagnosis not present

## 2023-01-03 DIAGNOSIS — I509 Heart failure, unspecified: Secondary | ICD-10-CM | POA: Diagnosis not present

## 2023-01-03 DIAGNOSIS — W19XXXA Unspecified fall, initial encounter: Secondary | ICD-10-CM | POA: Diagnosis not present

## 2023-01-03 DIAGNOSIS — J929 Pleural plaque without asbestos: Secondary | ICD-10-CM | POA: Diagnosis not present

## 2023-01-03 DIAGNOSIS — E785 Hyperlipidemia, unspecified: Secondary | ICD-10-CM | POA: Diagnosis not present

## 2023-01-03 DIAGNOSIS — I25810 Atherosclerosis of coronary artery bypass graft(s) without angina pectoris: Secondary | ICD-10-CM | POA: Diagnosis not present

## 2023-01-03 DIAGNOSIS — J811 Chronic pulmonary edema: Secondary | ICD-10-CM | POA: Diagnosis not present

## 2023-01-03 DIAGNOSIS — Z952 Presence of prosthetic heart valve: Secondary | ICD-10-CM | POA: Diagnosis not present

## 2023-01-03 DIAGNOSIS — I5021 Acute systolic (congestive) heart failure: Secondary | ICD-10-CM | POA: Diagnosis not present

## 2023-01-03 DIAGNOSIS — I517 Cardiomegaly: Secondary | ICD-10-CM | POA: Diagnosis not present

## 2023-01-03 DIAGNOSIS — Y92009 Unspecified place in unspecified non-institutional (private) residence as the place of occurrence of the external cause: Secondary | ICD-10-CM | POA: Diagnosis not present

## 2023-01-03 LAB — CBC WITH DIFFERENTIAL/PLATELET
Abs Immature Granulocytes: 0.01 10*3/uL (ref 0.00–0.07)
Basophils Absolute: 0.1 10*3/uL (ref 0.0–0.1)
Basophils Relative: 1 %
Eosinophils Absolute: 0.2 10*3/uL (ref 0.0–0.5)
Eosinophils Relative: 4 %
HCT: 38.2 % — ABNORMAL LOW (ref 39.0–52.0)
Hemoglobin: 13.6 g/dL (ref 13.0–17.0)
Immature Granulocytes: 0 %
Lymphocytes Relative: 14 %
Lymphs Abs: 0.7 10*3/uL (ref 0.7–4.0)
MCH: 31.5 pg (ref 26.0–34.0)
MCHC: 35.6 g/dL (ref 30.0–36.0)
MCV: 88.4 fL (ref 80.0–100.0)
Monocytes Absolute: 0.8 10*3/uL (ref 0.1–1.0)
Monocytes Relative: 16 %
Neutro Abs: 3.4 10*3/uL (ref 1.7–7.7)
Neutrophils Relative %: 65 %
Platelets: 131 10*3/uL — ABNORMAL LOW (ref 150–400)
RBC: 4.32 MIL/uL (ref 4.22–5.81)
RDW: 15.7 % — ABNORMAL HIGH (ref 11.5–15.5)
WBC: 5.1 10*3/uL (ref 4.0–10.5)
nRBC: 0 % (ref 0.0–0.2)

## 2023-01-03 LAB — PROTIME-INR
INR: 3.3 — ABNORMAL HIGH (ref 0.8–1.2)
Prothrombin Time: 33.5 seconds — ABNORMAL HIGH (ref 11.4–15.2)

## 2023-01-03 LAB — COMPREHENSIVE METABOLIC PANEL
ALT: 32 U/L (ref 0–44)
AST: 45 U/L — ABNORMAL HIGH (ref 15–41)
Albumin: 3.2 g/dL — ABNORMAL LOW (ref 3.5–5.0)
Alkaline Phosphatase: 95 U/L (ref 38–126)
Anion gap: 10 (ref 5–15)
BUN: 64 mg/dL — ABNORMAL HIGH (ref 8–23)
CO2: 26 mmol/L (ref 22–32)
Calcium: 8.8 mg/dL — ABNORMAL LOW (ref 8.9–10.3)
Chloride: 93 mmol/L — ABNORMAL LOW (ref 98–111)
Creatinine, Ser: 2.02 mg/dL — ABNORMAL HIGH (ref 0.61–1.24)
GFR, Estimated: 34 mL/min — ABNORMAL LOW (ref >60)
Glucose, Bld: 85 mg/dL (ref 70–99)
Potassium: 3.8 mmol/L (ref 3.5–5.1)
Sodium: 129 mmol/L — ABNORMAL LOW (ref 135–145)
Total Bilirubin: 2 mg/dL — ABNORMAL HIGH (ref 0.3–1.2)
Total Protein: 7.6 g/dL (ref 6.5–8.1)

## 2023-01-03 LAB — BRAIN NATRIURETIC PEPTIDE: B Natriuretic Peptide: 1458.7 pg/mL — ABNORMAL HIGH (ref 0.0–100.0)

## 2023-01-03 LAB — MAGNESIUM: Magnesium: 2.7 mg/dL — ABNORMAL HIGH (ref 1.7–2.4)

## 2023-01-03 LAB — PHOSPHORUS: Phosphorus: 4.5 mg/dL (ref 2.5–4.6)

## 2023-01-03 MED ORDER — WARFARIN SODIUM 1 MG PO TABS
1.0000 mg | ORAL_TABLET | Freq: Once | ORAL | Status: DC
  Filled 2023-01-03: qty 1

## 2023-01-03 NOTE — Progress Notes (Signed)
ANTICOAGULATION CONSULT NOTE  Pharmacy Consult for Warfarin Indication: atrial fibrillation  No Known Allergies  Patient Measurements: Height: 6\' 1"  (185.4 cm) Weight: 81 kg (178 lb 9.2 oz) IBW/kg (Calculated) : 79.9  Vital Signs: Temp: 97.9 F (36.6 C) (07/27 0735) Temp Source: Oral (07/27 0735) BP: 90/52 (07/27 0735) Pulse Rate: 47 (07/27 0735)  Labs: Recent Labs    01/01/23 1133 01/01/23 1437 01/01/23 1627 01/02/23 0219 01/03/23 0330  HGB 13.1  --   --   --   --   HCT 37.6*  --   --   --   --   PLT 126*  --   --   --   --   LABPROT  --  36.2*  --  36.9* 33.5*  INR  --  3.6*  --  3.7* 3.3*  CREATININE 2.71*  --   --  2.28*  --   TROPONINIHS  --  43* 41*  --   --     Estimated Creatinine Clearance: 31.6 mL/min (A) (by C-G formula based on SCr of 2.28 mg/dL (H)).  Assessment: 22 YOM with a history of CHF, aortic atherosclerosis, CABG 2 vessel & mitral valve replacement s/p STEMI, severe mitral regurgitation, esophageal strictures. Patient is presenting with increased weakness, confusion, and anxiety. Warfarin per pharmacy consult placed for atrial fibrillation.  Patient taking warfarin prior to arrival. Home dose is 3 mg (1 mg tablet x3). Last taken 07/24 1800.  INR supratherapeutic at 3.3 however trending down. Anticipate INR to be within therapeutic goal tomorrow so will resume warfarin tonight. No bleeding noted, CBC stable.  Goal of Therapy:  INR Goal 2-3 Monitor platelets by anticoagulation protocol: Yes   Plan:  Warfarin 1 mg PO tonight Daily INR Monitor for s/sx of bleeding  Thank you for involving pharmacy in this patient's care.  Loura Back, PharmD, BCPS Clinical Pharmacist Clinical phone for 01/03/2023 is [phone] 01/03/2023 8:49 AM

## 2023-01-03 NOTE — TOC Transition Note (Signed)
Transition of Care [facility]) - CM/SW Discharge Note   Patient Details  Name: Keith Jenkins MRN: [id number] Date of Birth: June 19, 1945  Transition [facility]) CM/SW Contact:  Lawerance Sabal, RN Phone Number: 01/03/2023, 11:49 AM   Clinical Narrative:     Sherron Monday w patient he would like to resume services with [facility].  He declines tub/ shower seat.  He has family to provide transportation home.   Final next level of care: Home w Home Health Services Barriers to Discharge: No Barriers Identified   Patient Goals and CMS Choice   Choice offered to / list presented to : NA  Discharge Placement                         Discharge Plan and Services Additional resources added to the After Visit Summary for   In-house Referral: NA Discharge Planning Services: CM Consult Post Acute Care Choice: NA          DME Arranged: N/A DME Agency: NA       HH Arranged: NA HH Agency: [facility] Date HH Agency Contacted: 01/03/23 Time HH Agency Contacted: 1149 Representative spoke with at [facility] Agency: Laurelyn Sickle  Social Determinants of Health (SDOH) Interventions SDOH Screenings   Food Insecurity: No Food Insecurity (01/02/2023)  Housing: Low Risk  (01/02/2023)  Transportation Needs: Unmet Transportation Needs (01/02/2023)  Utilities: Not At Risk (01/02/2023)  Alcohol Screen: Low Risk  (04/17/2022)  Depression (PHQ2-9): Low Risk  (04/14/2022)  Financial Resource Strain: High Risk (08/28/2022)  Physical Activity: Sufficiently Active (04/14/2022)  Social Connections: Moderately Integrated (04/17/2022)  Stress: No Stress Concern Present (04/14/2022)  Tobacco Use: Medium Risk (01/02/2023)     Readmission Risk Interventions    03/14/2022   11:02 AM  Readmission Risk Prevention Plan  Transportation Screening Complete  Medication Review (RN Care Manager) Complete  PCP or Specialist appointment within 3-5 days of discharge Complete  HRI or Home Care Consult Complete

## 2023-01-03 NOTE — Discharge Summary (Signed)
Physician Discharge Summary   Patient: Keith Jenkins MRN: [id number] DOB: 01-04-1946  Admit date:     01/01/2023  Discharge date: 01/03/2023  Discharge Physician: Marguerita Merles, DO   PCP: Blane Ohara, MD   Recommendations at discharge:  {Tip this will not be part of the note when signed- Example include specific recommendations for outpatient follow-up, pending tests to follow-up on. (Optional):26781}  ***  Discharge Diagnoses: Principal Problem:   Fall at home, initial encounter Active Problems:   Hyperlipidemia   S/P mitral valve replacement   Stage 3b chronic kidney disease (CKD) (HCC)   Chronic systolic heart failure (HCC)   CAD (coronary artery disease)   Paroxysmal atrial fibrillation (HCC)   CHF (congestive heart failure) (HCC)   Hyponatremia  Resolved Problems:   * No resolved hospital problems. [facility] Course: HPI per Dr. Earlie Lou on 01/01/23 Keith Jenkins is a 77 y.o. male with medical history significant of non-ST elevation MI, severe mitral regurgitation with repair, esophageal stricture, osteoarthritis, COPD, prediabetes, GERD, severe systolic dysfunction with last EF of 20 to 25% on December 25, 2022, history of hyperlipidemia who presented to the ER complaining of a fall.  Family said patient had a fall yesterday.  His sandals got caught on a rug and he fell forward.  He injured his left elbow.  Patient did not hit his head and no loss of consciousness.  Patient has been on warfarin.  Since then he felt dizzy.  He also has had increased confusion.  Patient was noted to have mild increase in shortness of breath.  Also confusion.  In the ER he was seen and evaluated.  Patient noted to have increased pulmonary edema and appears to have worsening CHF.  Patient is being admitted for further evaluation and treatment.   **Interim History Cardiology was consulted for further evaluation recommending continue diuresis and likely will change to oral diuresis in the AM.  PT OT  recommending home health.  Assessment and Plan:  Status post fall -Appears mechanical.  Cannot rule out arrhythmias.   -Patient is EF of 20 to 25% makes him more susceptible to arrhythmias.  -No evidence of head injury or bleed as Head and Cervical Spine CT done and showed "No CT evidence of intracranial injury. No acute fracture or traumatic malalignment of the cervical spine. Severe bilateral neural foraminal stenosis at C5-C6, C6-C7." -Continue his warfarin and therefore.  -Urinalysis was unremarkable -PT OT to further evaluate and treat and they are recommending home health   Acute on chronic systolic dysfunction -Patient seems to be in CHF exacerbation.   -BP was elevated at 1924.8 and will repeat in the a.m. and continue diuresis.   -Continue other home regimen.  Change his diuretics to IV route. -Strict I's and O's and daily weights  Intake/Output Summary (Last 24 hours) at 01/02/2023 2011 Last data filed at 01/02/2023 1856 Gross per 24 hour  Intake 711 ml  Output 1025 ml  Net -314 ml  -Cardiology consulted and diuresis per them; continue to monitor for signs and symptoms of volume overload   Hypervolemic hyponatremia -Probably secondary to CHF and fluid overload.   -C/w Diuresis and continue to Monitor Na+ Trend: Recent Labs  Lab 12/17/22 1245 12/19/22 1232 12/25/22 1118 01/01/23 1133 01/02/23 0219  NA 132* 133* 125* 129* 129*  -Repeat CMP in the AM   AKI on chronic kidney disease stage IIIb  -BUN/Cr worse than Baseline -BUN/Cr Trend: Recent Labs  Lab 12/17/22 1245 12/19/22 1232  12/25/22 1118 01/01/23 1133 01/02/23 0219  BUN 36* 42* 70* 71* 67*  CREATININE 1.97* 1.99* 2.71* 2.71* 2.28*  -C/w Diuresis  -Avoid Nephrotoxic Medications, Contrast Dyes, Hypotension and Dehydration to Ensure Adequate Renal Perfusion and will need to Renally Adjust Meds -Continue to Monitor and Trend Renal Function carefully and repeat CMP in the AM   Status post mitral valve  replacement -Patient is on warfarin.   -Will continue warfarin per pharmacy protocol.  INR at 3.7 today   Paroxysmal atrial fibrillation -Rate is controlled.  Continue warfarin -Cardiology consulted and will continue to Monitor on Cardiac Telemetry   Hyperlipidemia -Continue statin  Assessment and Plan: No notes have been filed under this hospital service. Service: Hospitalist     {Tip this will not be part of the note when signed Body mass index is 23.56 kg/m. , ,  Active Pressure Injury/Wound(s)     Pressure Ulcer  Duration          Pressure Injury 11/04/21 Buttocks Right;Medial Deep Tissue Pressure Injury - Purple or maroon localized area of discolored intact skin or blood-filled blister due to damage of underlying soft tissue from pressure and/or shear. 425 days           (Optional):26781}  {(NOTE) Pain control PDMP Statment (Optional):26782} Consultants: *** Procedures performed: ***  Disposition: {Plan; Disposition:26390} Diet recommendation:  Discharge Diet Orders (From admission, onward)     Start     Ordered   01/03/23 0000  Diet - low sodium heart healthy        01/03/23 1119           {Diet_Plan:26776} DISCHARGE MEDICATION: Allergies as of 01/03/2023   No Known Allergies      Medication List     TAKE these medications    acetaminophen 500 MG tablet Commonly known as: TYLENOL Take 250 mg by mouth every 6 (six) hours as needed for moderate pain.   albuterol (2.5 MG/3ML) 0.083% nebulizer solution Commonly known as: PROVENTIL Inhale 3 mLs into the lungs every 6 (six) hours as needed for wheezing or shortness of breath.   albuterol 108 (90 Base) MCG/ACT inhaler Commonly known as: VENTOLIN HFA Inhale 2 puffs into the lungs every 6 (six) hours as needed for wheezing or shortness of breath.   amiodarone 100 MG tablet Commonly known as: PACERONE TAKE ONE TABLET BY MOUTH ONCE DAILY   Aspirin Low Dose 81 MG tablet Generic drug: aspirin  EC TAKE ONE TABLET BY MOUTH ONCE DAILY   atorvastatin 80 MG tablet Commonly known as: LIPITOR TAKE ONE TABLET BY MOUTH EVERYDAY AT BEDTIME What changed: See the new instructions.   cyanocobalamin 1000 MCG/ML injection Commonly known as: VITAMIN B12 Inject 1 mL (1,000 mcg total) into the muscle every 30 (thirty) days.   dapagliflozin propanediol 10 MG Tabs tablet Commonly known as: FARXIGA Take 1 tablet (10 mg total) by mouth daily.   escitalopram 10 MG tablet Commonly known as: LEXAPRO Take 1 tablet (10 mg total) by mouth daily.   esomeprazole 40 MG capsule Commonly known as: NEXIUM Take 40 mg by mouth daily.   ezetimibe 10 MG tablet Commonly known as: ZETIA Take 0.5 tablets (5 mg total) by mouth daily.   gabapentin 600 MG tablet Commonly known as: Neurontin Take 1 tablet (600 mg total) by mouth at bedtime.   Iron (Ferrous Sulfate) 325 (65 Fe) MG Tabs Take 325 mg by mouth daily.   latanoprost 0.005 % ophthalmic solution Commonly known as: XALATAN Place 1  drop into both eyes at bedtime.   midodrine 10 MG tablet Commonly known as: PROAMATINE Take 1 tablet (10 mg total) by mouth 3 (three) times daily.   nitroGLYCERIN 0.4 MG SL tablet Commonly known as: NITROSTAT Place 1 tablet (0.4 mg total) under the tongue every 5 (five) minutes as needed for chest pain.   OVER THE COUNTER MEDICATION Apply 1 application  topically at bedtime. Medication: Mag Maxx Cream Back and restless leg   potassium chloride SA 20 MEQ tablet Commonly known as: KLOR-CON M TAKE TWO TABLETS BY MOUTH ONCE DAILY What changed:  how much to take when to take this additional instructions   spironolactone 25 MG tablet Commonly known as: ALDACTONE TAKE ONE TABLET BY MOUTH ONCE DAILY   torsemide 20 MG tablet Commonly known as: DEMADEX Take 4 tablets (80 mg total) by mouth daily. What changed:  how much to take when to take this additional instructions   Trelegy Ellipta 100-62.5-25 MCG/ACT  Aepb Generic drug: Fluticasone-Umeclidin-Vilant Inhale 1 puff into the lungs daily. What changed:  when to take this reasons to take this   warfarin 1 MG tablet Commonly known as: COUMADIN Take as directed. If you are unsure how to take this medication, talk to your nurse or doctor. Original instructions: Take 2-3 tablets BY MOUTH daily AS DIRECTED by coumadin clinic What changed:  how much to take how to take this when to take this additional instructions        Follow-up Information     [facility] Follow up.   Specialty: Home Health Services Why: for home health services Contact information: [address] [place] [address] [phone]                Discharge Exam: Ceasar Mons Weights   01/01/23 1131 01/02/23 0857 01/03/23 0537  Weight: 83.5 kg 81.5 kg 81 kg   ***  Condition at discharge: {DC Condition:26389}  The results of significant diagnostics from this hospitalization (including imaging, microbiology, ancillary and laboratory) are listed below for reference.   Imaging Studies: DG CHEST PORT 1 VIEW  Result Date: 01/03/2023 CLINICAL DATA:  Congestive heart failure exacerbation. EXAM: PORTABLE CHEST 1 VIEW COMPARISON:  One-view chest x-ray 01/01/2023 FINDINGS: Heart is enlarged. Aortic valve replacement and atrial clip noted. Size increasing interstitial edema is asymmetric on the right. Chronic left pleural thickening is again noted. IMPRESSION: Cardiomegaly with increasing interstitial edema compatible with congestive heart failure. Electronically Signed   By: Marin Roberts M.D.   On: 01/03/2023 14:08   CT Head Wo Contrast  Result Date: 01/01/2023 CLINICAL DATA:  Head trauma, minor (Age >= 65y); Neck trauma (Age >= 65y) EXAM: CT HEAD WITHOUT CONTRAST CT CERVICAL SPINE WITHOUT CONTRAST TECHNIQUE: Multidetector CT imaging of the head and cervical spine was performed following the standard protocol without intravenous contrast.  Multiplanar CT image reconstructions of the cervical spine were also generated. RADIATION DOSE REDUCTION: This exam was performed according to the departmental dose-optimization program which includes automated exposure control, adjustment of the mA and/or kV according to patient size and/or use of iterative reconstruction technique. COMPARISON:  CT Head and C Spine 02/12/22 FINDINGS: CT HEAD FINDINGS Brain: No evidence of acute infarction, hemorrhage, hydrocephalus, extra-axial collection or mass lesion/mass effect. Is sequela of microvascular ischemic change. Vascular: No hyperdense vessel or unexpected calcification. Skull: Chronic right nasal bone fracture. There are likely additional posttraumatic changes around the right maxillary sinus. Sinuses/Orbits: No middle ear or mastoid effusion. Paranasal sinuses notable  for mucosal thickening in the right maxillary sinus, which is completely opacified. Bilateral lens replacement. Orbits are otherwise unremarkable. Other: None. CT CERVICAL SPINE FINDINGS Alignment: Straightening of the normal cervical lordosis. Grade 1 anterolisthesis of C4 on C5. Skull base and vertebrae: No acute fracture. No primary bone lesion or focal pathologic process. Soft tissues and spinal canal: No evidence of high-grade spinal canal stenosis. Disc levels: No evidence of high-grade spinal canal stenosis. There is severe bilateral neural foraminal stenosis at C5-C6, C6-C7. Upper chest: Negative. Other: None IMPRESSION: 1. No CT evidence of intracranial injury. 2. No acute fracture or traumatic malalignment of the cervical spine. 3. Severe bilateral neural foraminal stenosis at C5-C6, C6-C7. Electronically Signed   By: Lorenza Cambridge M.D.   On: 01/01/2023 16:19   CT Cervical Spine Wo Contrast  Result Date: 01/01/2023 CLINICAL DATA:  Head trauma, minor (Age >= 65y); Neck trauma (Age >= 65y) EXAM: CT HEAD WITHOUT CONTRAST CT CERVICAL SPINE WITHOUT CONTRAST TECHNIQUE: Multidetector CT imaging  of the head and cervical spine was performed following the standard protocol without intravenous contrast. Multiplanar CT image reconstructions of the cervical spine were also generated. RADIATION DOSE REDUCTION: This exam was performed according to the departmental dose-optimization program which includes automated exposure control, adjustment of the mA and/or kV according to patient size and/or use of iterative reconstruction technique. COMPARISON:  CT Head and C Spine 02/12/22 FINDINGS: CT HEAD FINDINGS Brain: No evidence of acute infarction, hemorrhage, hydrocephalus, extra-axial collection or mass lesion/mass effect. Is sequela of microvascular ischemic change. Vascular: No hyperdense vessel or unexpected calcification. Skull: Chronic right nasal bone fracture. There are likely additional posttraumatic changes around the right maxillary sinus. Sinuses/Orbits: No middle ear or mastoid effusion. Paranasal sinuses notable for mucosal thickening in the right maxillary sinus, which is completely opacified. Bilateral lens replacement. Orbits are otherwise unremarkable. Other: None. CT CERVICAL SPINE FINDINGS Alignment: Straightening of the normal cervical lordosis. Grade 1 anterolisthesis of C4 on C5. Skull base and vertebrae: No acute fracture. No primary bone lesion or focal pathologic process. Soft tissues and spinal canal: No evidence of high-grade spinal canal stenosis. Disc levels: No evidence of high-grade spinal canal stenosis. There is severe bilateral neural foraminal stenosis at C5-C6, C6-C7. Upper chest: Negative. Other: None IMPRESSION: 1. No CT evidence of intracranial injury. 2. No acute fracture or traumatic malalignment of the cervical spine. 3. Severe bilateral neural foraminal stenosis at C5-C6, C6-C7. Electronically Signed   By: Lorenza Cambridge M.D.   On: 01/01/2023 16:19   DG Chest 1 View  Result Date: 01/01/2023 CLINICAL DATA:  Congestive heart failure. Leg swelling and weakness. EXAM: CHEST  1  VIEW COMPARISON:  03/18/2022; chest CT-03/18/2022 FINDINGS: Unchanged enlarged cardiac silhouette and mediastinal contours post median sternotomy, valve replacement and left atrial appendix clipping. Chronic pulmonary venous congestion without frank evidence of edema. Unchanged chronic trace bilateral effusions. No new focal airspace opacities. No pneumothorax. No acute osseous abnormalities. IMPRESSION: 1. Unchanged cardiomegaly and chronic pulmonary venous congestion without frank evidence of edema. 2. Unchanged chronic trace bilateral effusions. Electronically Signed   By: Simonne Come M.D.   On: 01/01/2023 14:48   DG Elbow Complete Left  Result Date: 01/01/2023 CLINICAL DATA:  Post fall, now with left elbow pain. EXAM: LEFT ELBOW - COMPLETE 3+ VIEW COMPARISON:  None Available. FINDINGS: There is a peripherally corticated ossicle adjacent to the tip of the olecranon process, favored to represent the sequela of remote avulsive injury though age-indeterminate in the absence of prior  examinations. Minimal enthesopathic change involving the olecranon process. Mild degenerative change involving the radial humeral and ulnar humeral joints with joint space loss, subchondral sclerosis and osteophytosis. No definite elbow joint effusion. Potential minimal amount of soft tissue swelling about the posterior aspect of the elbow. No radiopaque foreign body. IMPRESSION: 1. Peripherally corticated ossicle adjacent to the tip of the olecranon process, favored to represent the sequela of remote avulsive injury though age-indeterminate in the absence of prior examinations. Correlation for point tenderness at this location is advised. 2. Mild degenerative change involving the radial humeral and ulnar humeral joints. Electronically Signed   By: Simonne Come M.D.   On: 01/01/2023 14:46   ECHOCARDIOGRAM COMPLETE  Result Date: 12/25/2022    ECHOCARDIOGRAM REPORT   Patient Name:   Reese Miland Date of Exam: 12/25/2022 Medical Rec  #:  [id number]    Height:       73.0 in Accession #:    [id number]   Weight:       194.6 lb Date of Birth:  02/19/1946    BSA:          2.127 m Patient Age:    77 years     BP:           124/86 mmHg Patient Gender: M            HR:           68 bpm. Exam Location:  Outpatient Procedure: 2D Echo, Cardiac Doppler and Color Doppler Indications:    Cardiiomyopathy  History:        Patient has prior history of Echocardiogram examinations, most                 recent 06/12/2021. Cardiomyopathy and CHF, Previous Myocardial                 Infarction and CAD; Mitral Valve Disease.                  Mitral Valve: MITRAL MITRIS RESILIA 27 valve is present valve is                 present in the mitral position. Procedure Date: 11/01/21.  Sonographer:    Darlys Gales Referring Phys: Fritzi Mandes [facility] IMPRESSIONS  1. Global akinesis with akinesis of the inferolateral wall; overall, severe LV dysfunction. Severe, low flow/low gradient AS (DI 0.23, mean gradient 17 mmHg; AVA 0.7 cm2).  2. Left ventricular ejection fraction, by estimation, is 20 to 25%. The left ventricle has severely decreased function. The left ventricle demonstrates regional wall motion abnormalities (see scoring diagram/findings for description). The left ventricular internal cavity size was severely dilated. Left ventricular diastolic function could not be evaluated.  3. Right ventricular systolic function is mildly reduced. The right ventricular size is moderately enlarged.  4. Left atrial size was severely dilated.  5. Right atrial size was severely dilated.  6. The mitral valve has been repaired/replaced. Trivial mitral valve regurgitation. No evidence of mitral stenosis. There is a MITRAL MITRIS RESILIA 27 valve is present present in the mitral position. Procedure Date: 11/01/21.  7. Tricuspid valve regurgitation is moderate.  8. The aortic valve is normal in structure. Aortic valve regurgitation is mild. No aortic stenosis is present.  9. Aortic dilatation noted.  There is borderline dilatation of the aortic root, measuring 38 mm. FINDINGS  Left Ventricle: Left ventricular ejection fraction, by estimation, is 20 to 25%. The left ventricle has severely decreased function. The left ventricle  demonstrates regional wall motion abnormalities. The left ventricular internal cavity size was severely dilated. There is no left ventricular hypertrophy. Left ventricular diastolic function could not be evaluated due to mitral valve replacement. Left ventricular diastolic function could not be evaluated. Right Ventricle: The right ventricular size is moderately enlarged. Right ventricular systolic function is mildly reduced. Left Atrium: Left atrial size was severely dilated. Right Atrium: Right atrial size was severely dilated. Pericardium: There is no evidence of pericardial effusion. Mitral Valve: The mitral valve has been repaired/replaced. Trivial mitral valve regurgitation. There is a MITRAL MITRIS RESILIA 27 valve is present present in the mitral position. Procedure Date: 11/01/21. No evidence of mitral valve stenosis. MV peak gradient, 13.5 mmHg. The mean mitral valve gradient is 3.3 mmHg. Tricuspid Valve: The tricuspid valve is normal in structure. Tricuspid valve regurgitation is moderate . No evidence of tricuspid stenosis. Aortic Valve: The aortic valve is normal in structure. Aortic valve regurgitation is mild. Aortic regurgitation PHT measures 766 msec. No aortic stenosis is present. Aortic valve mean gradient measures 17.0 mmHg. Aortic valve peak gradient measures 24.6 mmHg. Aortic valve area, by VTI measures 0.71 cm. Pulmonic Valve: The pulmonic valve was normal in structure. Pulmonic valve regurgitation is not visualized. No evidence of pulmonic stenosis. Aorta: Aortic dilatation noted. There is borderline dilatation of the aortic root, measuring 38 mm. IAS/Shunts: No atrial level shunt detected by color flow Doppler. Additional Comments: Global akinesis with akinesis of  the inferolateral wall; overall, severe LV dysfunction. Severe, low flow/low gradient AS (DI 0.23, mean gradient 17 mmHg; AVA 0.7 cm2).  LEFT VENTRICLE PLAX 2D LVIDd:         6.80 cm LVIDs:         5.90 cm LV PW:         0.80 cm LV IVS:        1.00 cm LVOT diam:     2.00 cm LV SV:         46 LV SV Index:   22 LVOT Area:     3.14 cm  LV Volumes (MOD) LV vol d, MOD A2C: 266.0 ml LV vol d, MOD A4C: 205.0 ml LV vol s, MOD A2C: 194.0 ml LV vol s, MOD A4C: 161.0 ml LV SV MOD A2C:     72.0 ml LV SV MOD A4C:     205.0 ml LV SV MOD BP:      60.1 ml RIGHT VENTRICLE RV Basal diam:  5.60 cm RV Mid diam:    5.20 cm RV S prime:     9.14 cm/s TAPSE (M-mode): 1.5 cm LEFT ATRIUM              Index        RIGHT ATRIUM           Index LA Vol (A2C):   154.0 ml 72.41 ml/m  RA Area:     29.60 cm LA Vol (A4C):   53.8 ml  25.30 ml/m  RA Volume:   104.00 ml 48.90 ml/m LA Biplane Vol: 92.9 ml  43.68 ml/m  AORTIC VALVE AV Area (Vmax):    0.73 cm AV Area (Vmean):   0.67 cm AV Area (VTI):     0.71 cm AV Vmax:           248.00 cm/s AV Vmean:          197.000 cm/s AV VTI:            0.646 m AV Peak Grad:  24.6 mmHg AV Mean Grad:      17.0 mmHg LVOT Vmax:         57.30 cm/s LVOT Vmean:        41.900 cm/s LVOT VTI:          0.147 m LVOT/AV VTI ratio: 0.23 AI PHT:            766 msec  AORTA Ao Root diam: 3.60 cm Ao Asc diam:  3.80 cm MITRAL VALVE                TRICUSPID VALVE MV Area (PHT): 2.66 cm     TR Peak grad:   32.9 mmHg MV Area VTI:   0.81 cm     TR Vmax:        287.00 cm/s MV Peak grad:  13.5 mmHg MV Mean grad:  3.3 mmHg     SHUNTS MV Vmax:       1.84 m/s     Systemic VTI:  0.15 m MV Vmean:      78.1 cm/s    Systemic Diam: 2.00 cm MV Decel Time: 285 msec MV E velocity: 174.00 cm/s Olga Millers MD Electronically signed by Olga Millers MD Signature Date/Time: 12/25/2022/12:00:55 PM    Final     Microbiology: Results for orders placed or performed during the hospital encounter of 03/12/22  MRSA Next Gen by PCR, Nasal      Status: None   Collection Time: 03/12/22  6:36 PM   Specimen: Nasal Mucosa; Nasal Swab  Result Value Ref Range Status   MRSA by PCR Next Gen NOT DETECTED NOT DETECTED Final    Comment: (NOTE) The GeneXpert MRSA Assay (FDA approved for NASAL specimens only), is one component of a comprehensive MRSA colonization surveillance program. It is not intended to diagnose MRSA infection nor to guide or monitor treatment for MRSA infections. Test performance is not FDA approved in patients less than 61 years old. Performed at [facility], [address], [place], [place] [address]   Culture, body fluid w Gram Stain-bottle     Status: None   Collection Time: 03/18/22  2:35 PM   Specimen: Pleura  Result Value Ref Range Status   Specimen Description PLEURAL  Final   Special Requests NONE  Final   Culture   Final    NO GROWTH 5 DAYS Performed at [facility], [address], [place], [place] [address]    Report Status 03/23/2022 FINAL  Final  Gram stain     Status: None   Collection Time: 03/18/22  2:35 PM   Specimen: Pleura  Result Value Ref Range Status   Specimen Description PLEURAL  Final   Special Requests NONE  Final   Gram Stain   Final    WBC PRESENT, PREDOMINANTLY MONONUCLEAR NO ORGANISMS SEEN CYTOSPIN SMEAR Performed at [facility], [address], [place], [place] [address]    Report Status 03/18/2022 FINAL  Final    Labs: CBC: Recent Labs  Lab 01/01/23 1133 01/03/23 0910  WBC 6.7 5.1  NEUTROABS  --  3.4  HGB 13.1 13.6  HCT 37.6* 38.2*  MCV 86.6 88.4  PLT 126* 131*   Basic Metabolic Panel: Recent Labs  Lab 01/01/23 1133 01/02/23 0219 01/03/23 0910  NA 129* 129* 129*  K 4.9 4.1 3.8  CL 92* 95* 93*  CO2 23 22 26   GLUCOSE 92 88 85  BUN 71* 67* 64*  CREATININE 2.71* 2.28* 2.02*  CALCIUM 9.1 9.1 8.8*  MG  --   --  2.7*  PHOS  --   --  4.5   Liver Function Tests: Recent Labs  Lab 01/03/23 0910  AST 45*  ALT 32  ALKPHOS 95  BILITOT  2.0*  PROT 7.6  ALBUMIN 3.2*   CBG: No results for input(s): "GLUCAP" in the last 168 hours.  Discharge time spent: {LESS THAN/GREATER DGLO:75643} 30 minutes.  Signed: Merlene Laughter, DO [facility] 01/03/2023

## 2023-01-05 ENCOUNTER — Ambulatory Visit: Payer: Medicare HMO | Attending: Cardiovascular Disease | Admitting: Cardiovascular Disease

## 2023-01-05 ENCOUNTER — Other Ambulatory Visit (HOSPITAL_COMMUNITY): Payer: Medicare HMO

## 2023-01-05 ENCOUNTER — Encounter: Payer: Self-pay | Admitting: Cardiovascular Disease

## 2023-01-05 VITALS — BP 114/70 | HR 55 | Ht 73.0 in | Wt 190.6 lb

## 2023-01-05 DIAGNOSIS — I350 Nonrheumatic aortic (valve) stenosis: Secondary | ICD-10-CM | POA: Diagnosis not present

## 2023-01-05 DIAGNOSIS — Z01812 Encounter for preprocedural laboratory examination: Secondary | ICD-10-CM
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
## 2023-01-05 NOTE — Progress Notes (Signed)
Pre Surgical Assessment: 5 M Walk Test  24M=16.75ft  5 Meter Walk Test- trial 1: 8.26 seconds 5 Meter Walk Test- trial 2: 10.15 seconds 5 Meter Walk Test- trial 3: 9.75 seconds 5 Meter Walk Test Average: 9.38 seconds

## 2023-01-05 NOTE — Patient Instructions (Signed)
Medication Instructions:  No changes *If you need a refill on your cardiac medications before your next appointment, please call your pharmacy*   Lab Work: Please return Wed Aug 7 for blood work (BMET, CBC)   Testing/Procedures: Your physician has requested that you have a cardiac catheterization. Cardiac catheterization is used to diagnose and/or treat various heart conditions. Doctors may recommend this procedure for a number of different reasons. The most common reason is to evaluate chest pain. Chest pain can be a symptom of coronary artery disease (CAD), and cardiac catheterization can show whether plaque is narrowing or blocking your heart's arteries. This procedure is also used to evaluate the valves, as well as measure the blood flow and oxygen levels in different parts of your heart. For further information please visit [email or web address] Please follow instruction sheet, as given.    Follow-Up: Per Structural Heart Team  You have been referred to [facility] A DEPT OF [place] AT [facility] [address] [place], [place] [address] [place] [address] Dept: [phone] Loc: [phone]  Johnatha Gautier  01/05/2023  You are scheduled for a Cardiac Catheterization on Friday, August 9 with Dr. Verne Carrow.  1. Please arrive at the [facility] [place] [facility] (Main Entrance A) at [facility]: [address] [place], [place] [address] at 6:00 AM (This time is 4.5 hour(s) before your procedure to ensure your preparation/hydration with IV fluids). Free valet parking service is available. You will check in at ADMITTING. The support person will be asked to wait in the waiting room.  It is OK to have someone drop you off and come back when you are ready to be discharged.    Special note: Every effort is made to have your procedure done on time. Please understand that emergencies sometimes delay scheduled  procedures.  2. Diet: Do not eat solid foods after midnight.  The patient may have clear liquids until 5am upon the day of the procedure.  3. Labs: You will need to have blood drawn on Wednesday, August 7 at [facility] at [facility]. [address], [place]  Open: 7:30am - 5pm    Phone: [phone]. You do not need to be fasting.  4. Medication instructions in preparation for your procedure:   Contrast Allergy: No  NO COUMADIN (Warfarin) after Saturday, August 3 NO TORSEMIDE MORNING OF PROCEDURE NO FARXIGA MORNING OF PROCEDURE  On the morning of your procedure, take your Aspirin 81 mg and any morning medicines NOT listed above.  You may use sips of water.  5. Plan to go home the same day, you will only stay overnight if medically necessary. 6. Bring a current list of your medications and current insurance cards. 7. You MUST have a responsible person to drive you home. 8. Someone MUST be with you the first 24 hours after you arrive home or your discharge will be delayed. 9. Please wear clothes that are easy to get on and off and wear slip-on shoes.  Thank you for allowing [place] to care for you!   --  Invasive Cardiovascular services

## 2023-01-06 ENCOUNTER — Other Ambulatory Visit (HOSPITAL_COMMUNITY): Payer: Self-pay

## 2023-01-06 DIAGNOSIS — I5022 Chronic systolic (congestive) heart failure: Secondary | ICD-10-CM

## 2023-01-07 ENCOUNTER — Telehealth: Payer: Self-pay

## 2023-01-07 ENCOUNTER — Emergency Department (HOSPITAL_COMMUNITY)
Admission: EM | Admit: 2023-01-07 | Discharge: 2023-01-08 | Disposition: A | Discharge status: Home / Self Care | Payer: Medicare HMO | Attending: Emergency Medicine | Admitting: Emergency Medicine

## 2023-01-07 ENCOUNTER — Telehealth (HOSPITAL_COMMUNITY): Payer: Self-pay

## 2023-01-07 ENCOUNTER — Telehealth: Payer: Self-pay | Admitting: Cardiovascular Disease

## 2023-01-07 ENCOUNTER — Other Ambulatory Visit: Payer: Self-pay

## 2023-01-07 ENCOUNTER — Emergency Department (HOSPITAL_COMMUNITY): Payer: Medicare HMO

## 2023-01-07 ENCOUNTER — Encounter (HOSPITAL_COMMUNITY): Payer: Self-pay

## 2023-01-07 DIAGNOSIS — J90 Pleural effusion, not elsewhere classified: Secondary | ICD-10-CM | POA: Diagnosis not present

## 2023-01-07 DIAGNOSIS — I509 Heart failure, unspecified: Secondary | ICD-10-CM | POA: Diagnosis not present

## 2023-01-07 DIAGNOSIS — R0600 Dyspnea, unspecified: Secondary | ICD-10-CM | POA: Diagnosis not present

## 2023-01-07 DIAGNOSIS — Z87891 Personal history of nicotine dependence: Secondary | ICD-10-CM | POA: Diagnosis not present

## 2023-01-07 DIAGNOSIS — Z7901 Long term (current) use of anticoagulants: Secondary | ICD-10-CM | POA: Insufficient documentation

## 2023-01-07 DIAGNOSIS — Z7982 Long term (current) use of aspirin: Secondary | ICD-10-CM | POA: Diagnosis not present

## 2023-01-07 DIAGNOSIS — I350 Nonrheumatic aortic (valve) stenosis: Secondary | ICD-10-CM | POA: Diagnosis not present

## 2023-01-07 DIAGNOSIS — I517 Cardiomegaly: Secondary | ICD-10-CM | POA: Diagnosis not present

## 2023-01-07 DIAGNOSIS — I5043 Acute on chronic combined systolic (congestive) and diastolic (congestive) heart failure: Secondary | ICD-10-CM | POA: Diagnosis not present

## 2023-01-07 DIAGNOSIS — Z7984 Long term (current) use of oral hypoglycemic drugs: Secondary | ICD-10-CM | POA: Diagnosis not present

## 2023-01-07 DIAGNOSIS — H409 Unspecified glaucoma: Secondary | ICD-10-CM | POA: Diagnosis not present

## 2023-01-07 DIAGNOSIS — S51812D Laceration without foreign body of left forearm, subsequent encounter: Secondary | ICD-10-CM | POA: Diagnosis not present

## 2023-01-07 DIAGNOSIS — I2510 Atherosclerotic heart disease of native coronary artery without angina pectoris: Secondary | ICD-10-CM | POA: Diagnosis not present

## 2023-01-07 DIAGNOSIS — R7303 Prediabetes: Secondary | ICD-10-CM | POA: Diagnosis not present

## 2023-01-07 DIAGNOSIS — K222 Esophageal obstruction: Secondary | ICD-10-CM | POA: Diagnosis not present

## 2023-01-07 DIAGNOSIS — N1832 Chronic kidney disease, stage 3b: Secondary | ICD-10-CM | POA: Diagnosis not present

## 2023-01-07 DIAGNOSIS — I480 Paroxysmal atrial fibrillation: Secondary | ICD-10-CM | POA: Diagnosis not present

## 2023-01-07 DIAGNOSIS — K219 Gastro-esophageal reflux disease without esophagitis: Secondary | ICD-10-CM | POA: Diagnosis not present

## 2023-01-07 DIAGNOSIS — I110 Hypertensive heart disease with heart failure: Secondary | ICD-10-CM | POA: Diagnosis not present

## 2023-01-07 DIAGNOSIS — R110 Nausea: Secondary | ICD-10-CM | POA: Diagnosis not present

## 2023-01-07 DIAGNOSIS — I130 Hypertensive heart and chronic kidney disease with heart failure and stage 1 through stage 4 chronic kidney disease, or unspecified chronic kidney disease: Secondary | ICD-10-CM | POA: Diagnosis not present

## 2023-01-07 DIAGNOSIS — E785 Hyperlipidemia, unspecified: Secondary | ICD-10-CM | POA: Diagnosis not present

## 2023-01-07 DIAGNOSIS — N179 Acute kidney failure, unspecified: Secondary | ICD-10-CM | POA: Diagnosis not present

## 2023-01-07 DIAGNOSIS — D696 Thrombocytopenia, unspecified: Secondary | ICD-10-CM | POA: Diagnosis not present

## 2023-01-07 DIAGNOSIS — I252 Old myocardial infarction: Secondary | ICD-10-CM | POA: Diagnosis not present

## 2023-01-07 DIAGNOSIS — I5021 Acute systolic (congestive) heart failure: Secondary | ICD-10-CM | POA: Diagnosis not present

## 2023-01-07 DIAGNOSIS — I5023 Acute on chronic systolic (congestive) heart failure: Secondary | ICD-10-CM | POA: Diagnosis not present

## 2023-01-07 DIAGNOSIS — Z7951 Long term (current) use of inhaled steroids: Secondary | ICD-10-CM | POA: Diagnosis not present

## 2023-01-07 DIAGNOSIS — J449 Chronic obstructive pulmonary disease, unspecified: Secondary | ICD-10-CM | POA: Diagnosis not present

## 2023-01-07 DIAGNOSIS — Z9181 History of falling: Secondary | ICD-10-CM | POA: Diagnosis not present

## 2023-01-07 DIAGNOSIS — E871 Hypo-osmolality and hyponatremia: Secondary | ICD-10-CM | POA: Diagnosis not present

## 2023-01-07 DIAGNOSIS — M1990 Unspecified osteoarthritis, unspecified site: Secondary | ICD-10-CM | POA: Diagnosis not present

## 2023-01-07 DIAGNOSIS — R918 Other nonspecific abnormal finding of lung field: Secondary | ICD-10-CM | POA: Diagnosis not present

## 2023-01-07 DIAGNOSIS — I700 Atherosclerosis of aorta: Secondary | ICD-10-CM | POA: Diagnosis not present

## 2023-01-07 LAB — URINALYSIS, ROUTINE W REFLEX MICROSCOPIC
Bilirubin Urine: NEGATIVE
Glucose, UA: 150 mg/dL — AB
Hgb urine dipstick: NEGATIVE
Ketones, ur: NEGATIVE mg/dL
Leukocytes,Ua: NEGATIVE
Nitrite: NEGATIVE
Protein, ur: NEGATIVE mg/dL
Specific Gravity, Urine: 1.009 (ref 1.005–1.030)
pH: 6 (ref 5.0–8.0)

## 2023-01-07 LAB — COMPREHENSIVE METABOLIC PANEL
ALT: 42 U/L (ref 0–44)
AST: 60 U/L — ABNORMAL HIGH (ref 15–41)
Albumin: 3.6 g/dL (ref 3.5–5.0)
Alkaline Phosphatase: 131 U/L — ABNORMAL HIGH (ref 38–126)
Anion gap: 16 — ABNORMAL HIGH (ref 5–15)
BUN: 67 mg/dL — ABNORMAL HIGH (ref 8–23)
CO2: 21 mmol/L — ABNORMAL LOW (ref 22–32)
Calcium: 9 mg/dL (ref 8.9–10.3)
Chloride: 86 mmol/L — ABNORMAL LOW (ref 98–111)
Creatinine, Ser: 2.62 mg/dL — ABNORMAL HIGH (ref 0.61–1.24)
GFR, Estimated: 25 mL/min — ABNORMAL LOW (ref >60)
Glucose, Bld: 131 mg/dL — ABNORMAL HIGH (ref 70–99)
Potassium: 4.8 mmol/L (ref 3.5–5.1)
Sodium: 123 mmol/L — ABNORMAL LOW (ref 135–145)
Total Bilirubin: 1.8 mg/dL — ABNORMAL HIGH (ref 0.3–1.2)
Total Protein: 8 g/dL (ref 6.5–8.1)

## 2023-01-07 LAB — TROPONIN I (HIGH SENSITIVITY)
Troponin I (High Sensitivity): 41 ng/L — ABNORMAL HIGH (ref <18)
Troponin I (High Sensitivity): 38 ng/L — ABNORMAL HIGH (ref <18)

## 2023-01-07 LAB — CBC
HCT: 36.4 % — ABNORMAL LOW (ref 39.0–52.0)
Hemoglobin: 12.6 g/dL — ABNORMAL LOW (ref 13.0–17.0)
MCH: 30.7 pg (ref 26.0–34.0)
MCHC: 34.6 g/dL (ref 30.0–36.0)
MCV: 88.6 fL (ref 80.0–100.0)
Platelets: 138 10*3/uL — ABNORMAL LOW (ref 150–400)
RBC: 4.11 MIL/uL — ABNORMAL LOW (ref 4.22–5.81)
RDW: 15.9 % — ABNORMAL HIGH (ref 11.5–15.5)
WBC: 6.5 10*3/uL (ref 4.0–10.5)
nRBC: 0 % (ref 0.0–0.2)

## 2023-01-07 LAB — PROTIME-INR
INR: 3.1 — ABNORMAL HIGH (ref 0.8–1.2)
Prothrombin Time: 32.2 seconds — ABNORMAL HIGH (ref 11.4–15.2)

## 2023-01-07 LAB — BRAIN NATRIURETIC PEPTIDE: B Natriuretic Peptide: 2208.6 pg/mL — ABNORMAL HIGH (ref 0.0–100.0)

## 2023-01-07 LAB — LIPASE, BLOOD: Lipase: 40 U/L (ref 11–51)

## 2023-01-07 MED ORDER — FUROSEMIDE 10 MG/ML IJ SOLN
40.0000 mg | Freq: Once | INTRAMUSCULAR | Status: AC
  Administered 2023-01-07: 40 mg via INTRAVENOUS
  Filled 2023-01-07: qty 4

## 2023-01-07 NOTE — Telephone Encounter (Signed)
Pt c/o swelling/edema: STAT if pt has developed SOB within 24 hours  If swelling, where is the swelling located?   Yes - Abdomen, ankles and arms  How much weight have you gained and in what time span? 3 lb weight gain overnight  Have you gained 2 pounds in a day or 5 pounds in a week?  3 lbs weight gain overnight  Do you have a log of your daily weights (if so, list)?   Yes  180 yesterday to 183 this morning  Are you currently taking a fluid pill?   Yes  Are you currently SOB?   SOB  Have you traveled recently in a car or plane for an extended period of time?   Caller stated patient just came out of the hospital and is concerned regarding patient's overnight weight gain.

## 2023-01-07 NOTE — ED Provider Notes (Signed)
[place] EMERGENCY DEPARTMENT AT [facility] Provider Note   CSN: [id number] Arrival date & time: 01/07/23  1532     History  No chief complaint on file.   Keith Jenkins is a 77 y.o. male.  Pt is a 77 yo male with pmhx significant for arthritis, CAD, CHF, mitral regurg, and esophageal strictures.  Pt was admitted from 7/25-7/27 for a fall, weakness, and a CHF exac.  Pt said he felt ok when he first got home, but is now back to feeling the way that he did before he came in.  His PT said that he had rhonchi in his lungs and that he needed to come in.  He received IV lasix in the hospital and diuresed well.  Pt was seen by cards while in the hospital.  He's scheduled for an iron infusion on 8/2 and a heart cath is scheduled for 8/9.  Pt was nauseous earlier, but that has passed.        Home Medications Prior to Admission medications   Medication Sig Start Date End Date Taking? Authorizing Provider  acetaminophen (TYLENOL) 500 MG tablet Take 250 mg by mouth every 6 (six) hours as needed for moderate pain.    [provider]  albuterol (PROVENTIL) (2.5 MG/3ML) 0.083% nebulizer solution Inhale 3 mLs into the lungs every 6 (six) hours as needed for wheezing or shortness of breath. 05/05/22   [name], NP  albuterol (VENTOLIN HFA) 108 (90 Base) MCG/ACT inhaler Inhale 2 puffs into the lungs every 6 (six) hours as needed for wheezing or shortness of breath. 05/05/22   [name], NP  amiodarone (PACERONE) 100 MG tablet TAKE ONE TABLET BY MOUTH ONCE DAILY 12/18/22   Bensimhon, Bevelyn Buckles, MD  aspirin EC (ASPIRIN LOW DOSE) 81 MG tablet TAKE ONE TABLET BY MOUTH ONCE DAILY 12/18/22   Bensimhon, Bevelyn Buckles, MD  atorvastatin (LIPITOR) 80 MG tablet TAKE ONE TABLET BY MOUTH EVERYDAY AT BEDTIME Patient taking differently: Take 80 mg by mouth daily. 12/18/22   Bensimhon, Bevelyn Buckles, MD  cyanocobalamin (VITAMIN B12) 1000 MCG/ML injection Inject 1 mL (1,000 mcg total) into the  muscle every 30 (thirty) days. 10/14/22   CoxFritzi Mandes, MD  dapagliflozin propanediol (FARXIGA) 10 MG TABS tablet Take 1 tablet (10 mg total) by mouth daily. 05/08/22   Bensimhon, Bevelyn Buckles, MD  escitalopram (LEXAPRO) 10 MG tablet Take 1 tablet (10 mg total) by mouth daily. 06/20/22   [name], NP  esomeprazole (NEXIUM) 40 MG capsule Take 40 mg by mouth daily.    [provider]  ezetimibe (ZETIA) 10 MG tablet Take 0.5 tablets (5 mg total) by mouth daily. 06/20/22   [name], NP  Fluticasone-Umeclidin-Vilant (TRELEGY ELLIPTA) 100-62.5-25 MCG/ACT AEPB Inhale 1 puff into the lungs daily. Patient taking differently: Inhale 1 puff into the lungs daily as needed (wheezing). 05/14/22   [name], NP  gabapentin (NEURONTIN) 600 MG tablet Take 1 tablet (600 mg total) by mouth at bedtime. 05/05/22   [name], NP  Iron, Ferrous Sulfate, 325 (65 Fe) MG TABS Take 325 mg by mouth daily. 05/15/22   [name], NP  latanoprost (XALATAN) 0.005 % ophthalmic solution Place 1 drop into both eyes at bedtime. 06/10/21   [provider]  midodrine (PROAMATINE) 10 MG tablet Take 1 tablet (10 mg total) by mouth 3 (three) times daily. 11/28/22   Bensimhon, Bevelyn Buckles, MD  nitroGLYCERIN (NITROSTAT) 0.4 MG SL tablet  Place 1 tablet (0.4 mg total) under the tongue every 5 (five) minutes as needed for chest pain. 12/04/21 03/13/23  Jacklynn Ganong, FNP  OVER THE COUNTER MEDICATION Apply 1 application  topically at bedtime. Medication: Mag Maxx Cream Back and restless leg    [provider]  potassium chloride SA (KLOR-CON M) 20 MEQ tablet TAKE TWO TABLETS BY MOUTH ONCE DAILY Patient taking differently: Take 20-40 mEq by mouth See admin instructions. Patient takes 40 mg in the morning and 20 mg at night for 3 days 06/23/22   Bensimhon, Bevelyn Buckles, MD  spironolactone (ALDACTONE) 25 MG tablet TAKE ONE TABLET BY MOUTH ONCE DAILY 12/18/22   Bensimhon, Bevelyn Buckles, MD  torsemide  (DEMADEX) 20 MG tablet Take 4 tablets (80 mg total) by mouth daily. Patient taking differently: Take 40-80 mg by mouth See admin instructions. Takes 80 mg in the morning and 40 mg at night for 3 days. 12/26/22   Alen Bleacher, NP  warfarin (COUMADIN) 1 MG tablet Take 2-3 tablets BY MOUTH daily AS DIRECTED by coumadin clinic Patient taking differently: Take 3 mg by mouth daily. 08/28/22   Bensimhon, Bevelyn Buckles, MD      Allergies    Patient has no known allergies.    Review of Systems   Review of Systems  Respiratory:  Positive for shortness of breath.   Gastrointestinal:  Positive for nausea.  All other systems reviewed and are negative.   Physical Exam Updated Vital Signs BP 114/74 (BP Location: Right Arm)   Pulse (!) 58   Temp (!) 97.4 F (36.3 C)   Resp 18   Ht 6\' 1"  (1.854 m)   Wt 86.5 kg   SpO2 98%   BMI 25.16 kg/m  Physical Exam Vitals and nursing note reviewed.  Constitutional:      Appearance: Normal appearance. He is obese.  HENT:     Head: Normocephalic and atraumatic.     Right Ear: External ear normal.     Left Ear: External ear normal.     Nose: Nose normal.     Mouth/Throat:     Mouth: Mucous membranes are moist.     Pharynx: Oropharynx is clear.  Eyes:     Extraocular Movements: Extraocular movements intact.     Conjunctiva/sclera: Conjunctivae normal.     Pupils: Pupils are equal, round, and reactive to light.  Cardiovascular:     Rate and Rhythm: Normal rate and regular rhythm.     Pulses: Normal pulses.     Heart sounds: Normal heart sounds.  Pulmonary:     Breath sounds: Rhonchi present.  Abdominal:     General: Abdomen is flat. Bowel sounds are normal.     Palpations: Abdomen is soft.  Musculoskeletal:     Cervical back: Normal range of motion and neck supple.     Right lower leg: Edema present.     Left lower leg: Edema present.  Skin:    General: Skin is warm.     Capillary Refill: Capillary refill takes less than 2 seconds.  Neurological:      General: No focal deficit present.     Mental Status: He is alert and oriented to person, place, and time.  Psychiatric:        Mood and Affect: Mood normal.        Behavior: Behavior normal.     ED Results / Procedures / Treatments   Labs (all labs ordered are listed, but only abnormal results  are displayed) Labs Reviewed  COMPREHENSIVE METABOLIC PANEL - Abnormal; Notable for the following components:      Result Value   Sodium 123 (*)    Chloride 86 (*)    CO2 21 (*)    Glucose, Bld 131 (*)    BUN 67 (*)    Creatinine, Ser 2.62 (*)    AST 60 (*)    Alkaline Phosphatase 131 (*)    Total Bilirubin 1.8 (*)    GFR, Estimated 25 (*)    Anion gap 16 (*)    All other components within normal limits  CBC - Abnormal; Notable for the following components:   RBC 4.11 (*)    Hemoglobin 12.6 (*)    HCT 36.4 (*)    RDW 15.9 (*)    Platelets 138 (*)    All other components within normal limits  URINALYSIS, ROUTINE W REFLEX MICROSCOPIC - Abnormal; Notable for the following components:   Glucose, UA 150 (*)    All other components within normal limits  PROTIME-INR - Abnormal; Notable for the following components:   Prothrombin Time 32.2 (*)    INR 3.1 (*)    All other components within normal limits  BRAIN NATRIURETIC PEPTIDE - Abnormal; Notable for the following components:   B Natriuretic Peptide 2,208.6 (*)    All other components within normal limits  TROPONIN I (HIGH SENSITIVITY) - Abnormal; Notable for the following components:   Troponin I (High Sensitivity) 41 (*)    All other components within normal limits  TROPONIN I (HIGH SENSITIVITY) - Abnormal; Notable for the following components:   Troponin I (High Sensitivity) 38 (*)    All other components within normal limits  LIPASE, BLOOD    EKG EKG Interpretation Date/Time:  Wednesday January 07 2023 19:16:16 EDT Ventricular Rate:  59 PR Interval:  197 QRS Duration:  128 QT Interval:  460 QTC Calculation: 456 R  Axis:   132  Text Interpretation: Right and left arm electrode reversal, interpretation assumes no reversal Sinus rhythm Nonspecific intraventricular conduction delay Lateral infarct, age indeterminate No significant change since last tracing Confirmed by Jacalyn Lefevre [phone]) on 01/07/2023 7:23:28 PM  Radiology DG Chest Portable 1 View  Result Date: 01/07/2023 CLINICAL DATA:  Dyspnea EXAM: PORTABLE CHEST 1 VIEW COMPARISON:  01/03/2023 FINDINGS: The lungs are stably, mildly hyperinflated in keeping with changes of underlying COPD. There is interval development of mild diffuse asymmetric interstitial infiltrate throughout the left lung, infection versus asymmetric pulmonary edema. No pneumothorax. Stable small left pleural effusion. Coronary artery bypass grafting, aortic valve replacement, and left atrial clipping has been performed. Cardiac size is mildly enlarged, unchanged. No acute bone abnormality. IMPRESSION: 1. Interval development of mild diffuse asymmetric interstitial infiltrate throughout the left lung, infection versus asymmetric pulmonary edema. 2. Stable small left pleural effusion. 3. Stable mild cardiomegaly. Electronically Signed   By: Helyn Numbers M.D.   On: 01/07/2023 18:03    Procedures Procedures    Medications Ordered in ED Medications  furosemide (LASIX) injection 40 mg (40 mg Intravenous Given 01/07/23 1949)    ED Course/ Medical Decision Making/ A&P                                 Medical Decision Making Amount and/or Complexity of Data Reviewed Labs: ordered. Radiology: ordered.  Risk Prescription drug management.   This patient presents to the ED for concern of sob, this involves  an extensive number of treatment options, and is a complaint that carries with it a high risk of complications and morbidity.  The differential diagnosis includes chf exac, pna, electrolyte abn, anemia   Co morbidities that complicate the patient evaluation  arthritis, CAD,  CHF, mitral regurg, and esophageal strictures   Additional history obtained:  Additional history obtained from epic chart review External records from outside source obtained and reviewed including family   Lab Tests:  I Ordered, and personally interpreted labs.  The pertinent results include:  cbc with hgb 12.6 and plt low at 138 (stable), cmp with na low at 123 (stable), bun 67 and cr 2.62 (stable); ua nl; lip nl   Imaging Studies ordered:  I ordered imaging studies including cxr  I independently visualized and interpreted imaging which showed   Interval development of mild diffuse asymmetric interstitial  infiltrate throughout the left lung, infection versus asymmetric  pulmonary edema.  2. Stable small left pleural effusion.  3. Stable mild cardiomegaly.   I agree with the radiologist interpretation   Cardiac Monitoring:  The patient was maintained on a cardiac monitor.  I personally viewed and interpreted the cardiac monitored which showed an underlying rhythm of: nsr   Medicines ordered and prescription drug management:  I ordered medication including lasix  for sx  Reevaluation of the patient after these medicines showed that the patient improved I have reviewed the patients home medicines and have made adjustments as needed  Problem List / ED Course:  CHF exacerbation:  pt feeling much better after Lasix.  He does admit to eating a [place] pizza and Nachos 2 days ago.  He is encouraged to eat a heart heathy diet.  Pt able to ambulate without any sob.  His O2 sat stays in the upper 90s.   Reevaluation:  After the interventions noted above, I reevaluated the patient and found that they have :improved   Social Determinants of Health:  Lives at home   Dispostion:  After consideration of the diagnostic results and the patients response to treatment, I feel that the patent would benefit from discharge with outpatient f/u.          Final Clinical  Impression(s) / ED Diagnoses Final diagnoses:  Acute on chronic congestive heart failure, unspecified heart failure type [facility])    Rx / DC Orders ED Discharge Orders     None         Jacalyn Lefevre, MD 01/07/23 2302

## 2023-01-07 NOTE — ED Notes (Signed)
Pt asking for food  given 

## 2023-01-07 NOTE — ED Triage Notes (Signed)
Pt c/o upper back pain and vomitingx3d.

## 2023-01-07 NOTE — Telephone Encounter (Signed)
Sabra Heck PT with [facility] called requesting visits with the patient stating he was discharged from the hospital and is needing PT for balance and gait and also requesting Nursing for a skin tear on his left forearm that the patient got before being entered into the hospital. Is it ok to give the verbal ok?

## 2023-01-07 NOTE — Telephone Encounter (Signed)
Home health nurse went to see patient (first time seeing him) and reports that yesterday's weight was 180lb and this morning is 183lb. Pt is reportedly sitting in tripod position and does condone feeling more short of breath. He is still on room air and does not have labored breathing, just feels more dyspneic. Notes swelling in ankles, abdomen, and around eyes per wife. Vitals reported by [facility] nurse: BP 112/68, Temp 96.7, o2=99% on room air, HR 57. Confirmed that he is taking Spironolactone 25mg  daily and Torsemide 80mg  once daily in the morning only (reports evening dose has been d/c'd). Wife states he has one treatment of "flu-something" to get fluid off and doesn't know if she should go ahead and use that. Advised that I would route this message to HF clinic who manages him, but if he gets any worse before hearing back from [place], to proceed back to hospital. Wife asks that we call daughter Patrica Duel who's the second contact number and DPR on file as she "understands more of all this than we do."

## 2023-01-07 NOTE — Telephone Encounter (Signed)
Received call from patients daughter who states that she is taking patient to the ER here at [place]- per patients daughter patient has felt poorly all day and states that he is now vomiting. Per patients daughter she wanted to make HF team aware. Advised her I will forward message.

## 2023-01-07 NOTE — ED Notes (Signed)
The pts wife reports that the pt has been more short of breath than usual

## 2023-01-08 NOTE — Consult Note (Signed)
   [facility] CM Inpatient Consult   01/08/2023  [name] [birth year] [id number]  [facility] [[facility]]  Accountable Care Organization [ACO] Patient: Keith Jenkins Medicare  Primary Care Provider:  Blane Ohara, MD, with [facility]   Patient is currently active on Care Teams with  [facility] [[facility]] Chronic Care Management for chronic disease management services.  Patient has been engaged by a Tax inspector.      Plan: Will follow up with RN CCM regarding hospitalizations, ED and for follow up with high risk score for unplanned readmission risk.  Also, noted patient is active with Advanced HF clinic.  Of note, [facility] Care Management services does not replace or interfere with any services that are needed or arranged by inpatient [facility] care management team.   For additional questions or referrals please contact:  Charlesetta Shanks, RN BSN CCM [facility]  [phone] business mobile phone Toll free office [phone]  *Concierge Line  [phone] Fax number: [phone] [email or web address]

## 2023-01-09 ENCOUNTER — Telehealth: Payer: Self-pay

## 2023-01-09 ENCOUNTER — Ambulatory Visit (HOSPITAL_COMMUNITY)
Admission: RE | Admit: 2023-01-09 | Discharge: 2023-01-09 | Disposition: A | Discharge status: Home / Self Care | Payer: Medicare HMO | Source: Ambulatory Visit | Attending: Internal Medicine | Admitting: Internal Medicine

## 2023-01-09 DIAGNOSIS — I5022 Chronic systolic (congestive) heart failure: Secondary | ICD-10-CM | POA: Insufficient documentation

## 2023-01-09 MED ORDER — SODIUM CHLORIDE 0.9 % IV SOLN
510.0000 mg | Freq: Once | INTRAVENOUS | Status: AC
  Administered 2023-01-09: 510 mg via INTRAVENOUS
  Filled 2023-01-09: qty 510

## 2023-01-09 NOTE — Transitions of Care (Post Inpatient/ED Visit) (Signed)
   01/09/2023  Name: Keith Jenkins MRN: [id number] DOB: 09/09/1945  Today's TOC FU Call Status: Today's TOC FU Call Status:: Unsuccessful Call (1st Attempt) Unsuccessful Call (1st Attempt) Date: 01/09/23  Attempted to reach the patient regarding the most recent Inpatient/ED visit.  Follow Up Plan: Additional outreach attempts will be made to reach the patient to complete the Transitions of Care (Post Inpatient/ED visit) call.   Alto Denver RN, MSN, CCM RN Care Manager  [facility]  Ambulatory Care Management  Direct Number: [phone]

## 2023-01-12 ENCOUNTER — Telehealth: Payer: Self-pay

## 2023-01-12 ENCOUNTER — Ambulatory Visit (INDEPENDENT_AMBULATORY_CARE_PROVIDER_SITE_OTHER): Payer: Medicare HMO | Admitting: Family Medicine

## 2023-01-12 ENCOUNTER — Encounter: Payer: Self-pay | Admitting: Family Medicine

## 2023-01-12 VITALS — BP 110/70 | HR 58 | Temp 97.5°F | Ht 73.0 in | Wt 190.0 lb

## 2023-01-12 DIAGNOSIS — I350 Nonrheumatic aortic (valve) stenosis: Secondary | ICD-10-CM

## 2023-01-12 DIAGNOSIS — I5022 Chronic systolic (congestive) heart failure: Secondary | ICD-10-CM | POA: Diagnosis not present

## 2023-01-12 DIAGNOSIS — I480 Paroxysmal atrial fibrillation: Secondary | ICD-10-CM

## 2023-01-12 DIAGNOSIS — I5043 Acute on chronic combined systolic (congestive) and diastolic (congestive) heart failure: Secondary | ICD-10-CM | POA: Diagnosis not present

## 2023-01-12 DIAGNOSIS — N1832 Chronic kidney disease, stage 3b: Secondary | ICD-10-CM | POA: Diagnosis not present
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
## 2023-01-12 NOTE — Telephone Encounter (Signed)
Pt was evaluated in ED 

## 2023-01-12 NOTE — Transitions of Care (Post Inpatient/ED Visit) (Signed)
   01/12/2023  Name: Keith Jenkins MRN: [id number] DOB: [birth year]  Today's TOC FU Call Status: Today's TOC FU Call Status:: Unsuccessful Call (2nd Attempt) Unsuccessful Call (2nd Attempt) Date: 01/12/23  Attempted to reach the patient regarding the most recent Inpatient/ED visit.  Follow Up Plan: Additional outreach attempts will be made to reach the patient to complete the Transitions of Care (Post Inpatient/ED visit) call.   Alto Denver RN, MSN, CCM RN Care Manager  [facility]  Ambulatory Care Management  Direct Number: [phone]

## 2023-01-13 ENCOUNTER — Telehealth: Payer: Self-pay

## 2023-01-13 ENCOUNTER — Encounter: Payer: Self-pay | Admitting: Family Medicine

## 2023-01-13 ENCOUNTER — Ambulatory Visit (INDEPENDENT_AMBULATORY_CARE_PROVIDER_SITE_OTHER): Payer: Medicare HMO | Admitting: Family Medicine

## 2023-01-13 VITALS — BP 112/58 | HR 52 | Temp 96.9°F | Resp 18 | Ht 72.0 in | Wt 197.4 lb

## 2023-01-13 DIAGNOSIS — I5043 Acute on chronic combined systolic (congestive) and diastolic (congestive) heart failure: Secondary | ICD-10-CM | POA: Diagnosis not present

## 2023-01-13 DIAGNOSIS — I2510 Atherosclerotic heart disease of native coronary artery without angina pectoris: Secondary | ICD-10-CM | POA: Diagnosis not present

## 2023-01-13 DIAGNOSIS — S40812A Abrasion of left upper arm, initial encounter: Secondary | ICD-10-CM | POA: Diagnosis not present

## 2023-01-13 DIAGNOSIS — N184 Chronic kidney disease, stage 4 (severe): Secondary | ICD-10-CM | POA: Diagnosis not present

## 2023-01-13 DIAGNOSIS — R001 Bradycardia, unspecified: Secondary | ICD-10-CM | POA: Diagnosis not present

## 2023-01-13 DIAGNOSIS — E871 Hypo-osmolality and hyponatremia: Secondary | ICD-10-CM

## 2023-01-13 LAB — CBC WITH DIFFERENTIAL/PLATELET
Basophils Absolute: 0 x10E3/uL (ref 0.0–0.2)
Basos: 1 %
EOS (ABSOLUTE): 0.1 x10E3/uL (ref 0.0–0.4)
Eos: 2 %
Hematocrit: 36 % — ABNORMAL LOW (ref 37.5–51.0)
Hemoglobin: 12.3 g/dL — ABNORMAL LOW (ref 13.0–17.7)
Immature Grans (Abs): 0 x10E3/uL (ref 0.0–0.1)
Immature Granulocytes: 0 %
Lymphocytes Absolute: 0.6 x10E3/uL — ABNORMAL LOW (ref 0.7–3.1)
Lymphs: 11 %
MCH: 31 pg (ref 26.6–33.0)
MCHC: 34.2 g/dL (ref 31.5–35.7)
MCV: 91 fL (ref 79–97)
Monocytes Absolute: 0.8 x10E3/uL (ref 0.1–0.9)
Monocytes: 14 %
Neutrophils Absolute: 4.2 x10E3/uL (ref 1.4–7.0)
Neutrophils: 72 %
Platelets: 78 x10E3/uL — CL (ref 150–450)
RBC: 3.97 x10E6/uL — ABNORMAL LOW (ref 4.14–5.80)
RDW: 14.9 % (ref 11.6–15.4)
WBC: 5.7 x10E3/uL (ref 3.4–10.8)

## 2023-01-13 LAB — COMPREHENSIVE METABOLIC PANEL WITH GFR
ALT: 30 IU/L (ref 0–44)
AST: 41 IU/L — ABNORMAL HIGH (ref 0–40)
Albumin: 3.6 g/dL — ABNORMAL LOW (ref 3.8–4.8)
Alkaline Phosphatase: 147 IU/L — ABNORMAL HIGH (ref 44–121)
BUN/Creatinine Ratio: 23 (ref 10–24)
BUN: 52 mg/dL — ABNORMAL HIGH (ref 8–27)
Bilirubin Total: 1.2 mg/dL (ref 0.0–1.2)
CO2: 23 mmol/L (ref 20–29)
Calcium: 8.6 mg/dL (ref 8.6–10.2)
Chloride: 92 mmol/L — ABNORMAL LOW (ref 96–106)
Creatinine, Ser: 2.28 mg/dL — ABNORMAL HIGH (ref 0.76–1.27)
Globulin, Total: 3.4 g/dL (ref 1.5–4.5)
Glucose: 81 mg/dL (ref 70–99)
Potassium: 4.7 mmol/L (ref 3.5–5.2)
Sodium: 128 mmol/L — ABNORMAL LOW (ref 134–144)
Total Protein: 7 g/dL (ref 6.0–8.5)
eGFR: 29 mL/min/1.73 — ABNORMAL LOW

## 2023-01-13 LAB — LITHOLINK CKD PROGRAM

## 2023-01-13 MED ORDER — METOLAZONE 2.5 MG PO TABS: 2.5000 mg | ORAL_TABLET | Freq: Every day | ORAL | 0 refills | Status: DC | PRN

## 2023-01-13 NOTE — Assessment & Plan Note (Signed)
Recheck tomorrow

## 2023-01-13 NOTE — Patient Instructions (Addendum)
Take metolazone 2.5 with kcl 40 meq before the infusion starts. Marland Kitchen

## 2023-01-13 NOTE — Assessment & Plan Note (Signed)
Slightly worse.  I will be rechecking this tomorrow.
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
## 2023-01-13 NOTE — Assessment & Plan Note (Signed)
Left heart cath scheduled for this Friday.  Not currently having any chest pain

## 2023-01-13 NOTE — Assessment & Plan Note (Addendum)
Recommend pick up prescription for metolazone.  Take 2.5 mg 30 minutes prior to his subcutaneous Lasix Pump.  Patient was also instructed to take an extra dose of potassium chloride 40 mEq today.  Patient is scheduled to return tomorrow for blood work with cardiology and then to get a cardiac cath this Friday. Avoid salt in diet.  (Avoid Bojangles)  Discussed with Dr. Gala Romney on secure chat. Also significant review of records.

## 2023-01-13 NOTE — Assessment & Plan Note (Signed)
Chronic issue.  Managed by specialist.

## 2023-01-13 NOTE — Transitions of Care (Post Inpatient/ED Visit) (Signed)
   01/13/2023  Name: Keith Jenkins MRN: [id number] DOB: September 03, 1945  Today's TOC FU Call Status: Today's TOC FU Call Status:: Unsuccessful Call (3rd Attempt) Unsuccessful Call (3rd Attempt) Date: 01/13/23  Attempted to reach the patient regarding the most recent Inpatient/ED visit.  Follow Up Plan: No further outreach attempts will be made at this time. We have been unable to contact the patient.  Alto Denver RN, MSN, CCM RN Care Manager  [facility]  Ambulatory Care Management  Direct Number: [phone]

## 2023-01-13 NOTE — Assessment & Plan Note (Signed)
Dressed with antibiotic ointment.  Covered with nonstick and Coban. Recommended patient continue with either antibiotic ointment or silver Silvadene at home.  May leave open for periods of time when at rest.

## 2023-01-13 NOTE — Progress Notes (Signed)
Your pt

## 2023-01-14 ENCOUNTER — Other Ambulatory Visit (HOSPITAL_COMMUNITY): Payer: Self-pay | Admitting: Internal Medicine

## 2023-01-14 ENCOUNTER — Telehealth (HOSPITAL_COMMUNITY): Payer: Self-pay

## 2023-01-14 ENCOUNTER — Ambulatory Visit: Payer: Medicare HMO | Attending: Cardiology

## 2023-01-14 DIAGNOSIS — Z01812 Encounter for preprocedural laboratory examination: Secondary | ICD-10-CM

## 2023-01-14 DIAGNOSIS — I350 Nonrheumatic aortic (valve) stenosis: Secondary | ICD-10-CM

## 2023-01-14 NOTE — Telephone Encounter (Signed)
Patients daughter advised and verbalized understanding.

## 2023-01-14 NOTE — Assessment & Plan Note (Signed)
Check labs.  Continue to weigh self daily.  Elevate legs.

## 2023-01-14 NOTE — Assessment & Plan Note (Signed)
Check labs 

## 2023-01-14 NOTE — Assessment & Plan Note (Signed)
Management per specialist.  LHC scheduled. Considering TAVR.

## 2023-01-14 NOTE — Telephone Encounter (Signed)
Patients daughter called stating that on Sunday Keith Jenkins was up 3 pounds. Yesterday she gave him Furosicx and metolazone. Today he is down 1 pound and she wants to know if there is anything else she needs to give him today besides he regular medication.

## 2023-01-14 NOTE — Assessment & Plan Note (Signed)
Stable. Management per specialist.   

## 2023-01-15 ENCOUNTER — Encounter (HOSPITAL_COMMUNITY): Payer: Self-pay | Admitting: Cardiology

## 2023-01-15 ENCOUNTER — Telehealth: Payer: Self-pay

## 2023-01-15 ENCOUNTER — Telehealth (HOSPITAL_COMMUNITY): Payer: Self-pay | Admitting: Cardiology

## 2023-01-15 ENCOUNTER — Telehealth: Payer: Self-pay | Admitting: *Deleted

## 2023-01-15 DIAGNOSIS — M1990 Unspecified osteoarthritis, unspecified site: Secondary | ICD-10-CM | POA: Diagnosis not present

## 2023-01-15 DIAGNOSIS — K222 Esophageal obstruction: Secondary | ICD-10-CM | POA: Diagnosis not present

## 2023-01-15 DIAGNOSIS — I700 Atherosclerosis of aorta: Secondary | ICD-10-CM | POA: Diagnosis not present

## 2023-01-15 DIAGNOSIS — I2510 Atherosclerotic heart disease of native coronary artery without angina pectoris: Secondary | ICD-10-CM | POA: Diagnosis not present

## 2023-01-15 DIAGNOSIS — D696 Thrombocytopenia, unspecified: Secondary | ICD-10-CM | POA: Diagnosis not present

## 2023-01-15 DIAGNOSIS — Z9181 History of falling: Secondary | ICD-10-CM | POA: Diagnosis not present

## 2023-01-15 DIAGNOSIS — E785 Hyperlipidemia, unspecified: Secondary | ICD-10-CM | POA: Diagnosis not present

## 2023-01-15 DIAGNOSIS — Z7984 Long term (current) use of oral hypoglycemic drugs: Secondary | ICD-10-CM | POA: Diagnosis not present

## 2023-01-15 DIAGNOSIS — N179 Acute kidney failure, unspecified: Secondary | ICD-10-CM | POA: Diagnosis not present

## 2023-01-15 DIAGNOSIS — I252 Old myocardial infarction: Secondary | ICD-10-CM | POA: Diagnosis not present

## 2023-01-15 DIAGNOSIS — H409 Unspecified glaucoma: Secondary | ICD-10-CM | POA: Diagnosis not present

## 2023-01-15 DIAGNOSIS — I480 Paroxysmal atrial fibrillation: Secondary | ICD-10-CM | POA: Diagnosis not present

## 2023-01-15 DIAGNOSIS — I350 Nonrheumatic aortic (valve) stenosis: Secondary | ICD-10-CM | POA: Diagnosis not present

## 2023-01-15 DIAGNOSIS — E871 Hypo-osmolality and hyponatremia: Secondary | ICD-10-CM | POA: Diagnosis not present

## 2023-01-15 DIAGNOSIS — Z87891 Personal history of nicotine dependence: Secondary | ICD-10-CM | POA: Diagnosis not present

## 2023-01-15 DIAGNOSIS — Z7951 Long term (current) use of inhaled steroids: Secondary | ICD-10-CM | POA: Diagnosis not present

## 2023-01-15 DIAGNOSIS — R7303 Prediabetes: Secondary | ICD-10-CM | POA: Diagnosis not present

## 2023-01-15 DIAGNOSIS — N1832 Chronic kidney disease, stage 3b: Secondary | ICD-10-CM | POA: Diagnosis not present

## 2023-01-15 DIAGNOSIS — Z7982 Long term (current) use of aspirin: Secondary | ICD-10-CM | POA: Diagnosis not present

## 2023-01-15 DIAGNOSIS — S51812D Laceration without foreign body of left forearm, subsequent encounter: Secondary | ICD-10-CM | POA: Diagnosis not present

## 2023-01-15 DIAGNOSIS — J449 Chronic obstructive pulmonary disease, unspecified: Secondary | ICD-10-CM | POA: Diagnosis not present

## 2023-01-15 DIAGNOSIS — I130 Hypertensive heart and chronic kidney disease with heart failure and stage 1 through stage 4 chronic kidney disease, or unspecified chronic kidney disease: Secondary | ICD-10-CM | POA: Diagnosis not present

## 2023-01-15 DIAGNOSIS — I5023 Acute on chronic systolic (congestive) heart failure: Secondary | ICD-10-CM | POA: Diagnosis not present

## 2023-01-15 DIAGNOSIS — K219 Gastro-esophageal reflux disease without esophagitis: Secondary | ICD-10-CM | POA: Diagnosis not present

## 2023-01-15 NOTE — Telephone Encounter (Signed)
Transition Care Management Unsuccessful Follow-up Telephone Call  Date of discharge and from where:  [facility] 8/1  Attempts:  1st Attempt  Reason for unsuccessful TCM follow-up call:  No answer/busy   Lenard Forth [facility] Guide, [facility] [phone] [address] [place], [place], [place] [address] Phone: [phone] Email: [email or web address]

## 2023-01-15 NOTE — Telephone Encounter (Signed)
Transition Care Management Follow-up Telephone Call Date of discharge and from where: [facility] 8/1 How have you been since you were released from the hospital? Doing ok and nurse came out today his BP is low and oxygen level is low as well  Any questions or concerns? Yes  Items Reviewed: Did the pt receive and understand the discharge instructions provided? Yes  Medications obtained and verified? No  Other? No  Any new allergies since your discharge? No  Dietary orders reviewed? No Do you have support at home? Yes     Follow up appointments reviewed:  PCP Hospital f/u appt confirmed? Yes  Scheduled to see Nurse came from [facility]  on 8/8 @ . Specialist Hospital f/u appt confirmed? Yes  Scheduled to see  on  @. Are transportation arrangements needed? Yes  If their condition worsens, is the pt aware to call PCP or go to the Emergency Dept.? Yes Was the patient provided with contact information for the PCP's office or ED? Yes Was to pt encouraged to call back with questions or concerns? Yes    Lenard Forth [facility] Guide, [facility] [facility] [phone] [address] [place], [place], [place] [address] Phone: [phone] Email: [email or web address]

## 2023-01-15 NOTE — Telephone Encounter (Signed)
Pt aware via wife and my chart message sent to daughter  Pt already scheduled for follow up 8/16

## 2023-01-15 NOTE — Telephone Encounter (Signed)
His K is elevated. He needs to stop potassium and spiro. Repeat BMET tomorrow (will be here for cath). He's had multiple changes to diuretics and has not been seen in clinic. He needs to follow-up.

## 2023-01-15 NOTE — Telephone Encounter (Signed)
HHRN called after home visit today to report b/p 80/54 HR in the 50's Pt reports increase in fatigue,dizziness  +LE edema  Recent use of furoscix and increased diuretics  Scheduled for R/L heart cath with Dr Clifton James  (pre TAVR) on 8/8   Please advise

## 2023-01-15 NOTE — Telephone Encounter (Signed)
Cardiac Catheterization scheduled at [facility] for: Friday January 16, 2023 10:30 AM Arrival time [facility] Main Entrance A at: 6 AM-pre-procedure hydration  Nothing to eat after midnight prior to procedure, clear liquids until 5 AM day of procedure.  Medication instructions: -Hold:  Warfarin-none 01/11/23 until post procedure  Torsemide/Spironolactone/KCl/Metolazone-day before and day of per protocol GFR 28, already taken today  Farxiga-AM of procedure -Other usual morning medications can be taken with sips of water including aspirin 81 mg.  Plan to go home the same day, you will only stay overnight if medically necessary.  You must have responsible adult to drive you home.  Someone must be with you the first 24 hours after you arrive home.  Reviewed procedure instructions with patient's daughter (DPR), [name].

## 2023-01-16 ENCOUNTER — Telehealth: Payer: Self-pay

## 2023-01-16 ENCOUNTER — Encounter (HOSPITAL_COMMUNITY)
Admission: AD | Disposition: A | Discharge status: Hospice | Payer: Self-pay | Source: Home / Self Care | Attending: Internal Medicine

## 2023-01-16 ENCOUNTER — Inpatient Hospital Stay (HOSPITAL_COMMUNITY)
Admission: AD | Admit: 2023-01-16 | Discharge: 2023-01-24 | DRG: 287 | Disposition: A | Discharge status: Hospice | Payer: Medicare HMO | Attending: Internal Medicine | Admitting: Internal Medicine

## 2023-01-16 ENCOUNTER — Encounter (HOSPITAL_COMMUNITY): Payer: Medicare HMO

## 2023-01-16 DIAGNOSIS — E871 Hypo-osmolality and hyponatremia: Secondary | ICD-10-CM | POA: Diagnosis not present

## 2023-01-16 DIAGNOSIS — I5082 Biventricular heart failure: Secondary | ICD-10-CM | POA: Diagnosis not present

## 2023-01-16 DIAGNOSIS — Z66 Do not resuscitate: Secondary | ICD-10-CM | POA: Diagnosis not present

## 2023-01-16 DIAGNOSIS — K222 Esophageal obstruction: Secondary | ICD-10-CM | POA: Diagnosis not present

## 2023-01-16 DIAGNOSIS — Z711 Person with feared health complaint in whom no diagnosis is made: Secondary | ICD-10-CM | POA: Diagnosis not present

## 2023-01-16 DIAGNOSIS — Z951 Presence of aortocoronary bypass graft: Secondary | ICD-10-CM | POA: Diagnosis not present

## 2023-01-16 DIAGNOSIS — G2581 Restless legs syndrome: Secondary | ICD-10-CM | POA: Diagnosis present

## 2023-01-16 DIAGNOSIS — Z5982 Transportation insecurity: Secondary | ICD-10-CM

## 2023-01-16 DIAGNOSIS — R001 Bradycardia, unspecified: Secondary | ICD-10-CM | POA: Diagnosis present

## 2023-01-16 DIAGNOSIS — Z7982 Long term (current) use of aspirin: Secondary | ICD-10-CM

## 2023-01-16 DIAGNOSIS — I350 Nonrheumatic aortic (valve) stenosis: Secondary | ICD-10-CM | POA: Diagnosis not present

## 2023-01-16 DIAGNOSIS — I130 Hypertensive heart and chronic kidney disease with heart failure and stage 1 through stage 4 chronic kidney disease, or unspecified chronic kidney disease: Secondary | ICD-10-CM | POA: Diagnosis not present

## 2023-01-16 DIAGNOSIS — R64 Cachexia: Secondary | ICD-10-CM | POA: Diagnosis present

## 2023-01-16 DIAGNOSIS — Z515 Encounter for palliative care: Secondary | ICD-10-CM

## 2023-01-16 DIAGNOSIS — Z87891 Personal history of nicotine dependence: Secondary | ICD-10-CM | POA: Diagnosis not present

## 2023-01-16 DIAGNOSIS — I2510 Atherosclerotic heart disease of native coronary artery without angina pectoris: Secondary | ICD-10-CM | POA: Diagnosis present

## 2023-01-16 DIAGNOSIS — Z5986 Financial insecurity: Secondary | ICD-10-CM

## 2023-01-16 DIAGNOSIS — J449 Chronic obstructive pulmonary disease, unspecified: Secondary | ICD-10-CM | POA: Diagnosis not present

## 2023-01-16 DIAGNOSIS — I5023 Acute on chronic systolic (congestive) heart failure: Secondary | ICD-10-CM | POA: Diagnosis not present

## 2023-01-16 DIAGNOSIS — R54 Age-related physical debility: Secondary | ICD-10-CM | POA: Diagnosis present

## 2023-01-16 DIAGNOSIS — D649 Anemia, unspecified: Secondary | ICD-10-CM | POA: Diagnosis not present

## 2023-01-16 DIAGNOSIS — N184 Chronic kidney disease, stage 4 (severe): Secondary | ICD-10-CM | POA: Diagnosis not present

## 2023-01-16 DIAGNOSIS — I252 Old myocardial infarction: Secondary | ICD-10-CM | POA: Diagnosis not present

## 2023-01-16 DIAGNOSIS — I5084 End stage heart failure: Secondary | ICD-10-CM | POA: Diagnosis present

## 2023-01-16 DIAGNOSIS — I959 Hypotension, unspecified: Secondary | ICD-10-CM | POA: Diagnosis not present

## 2023-01-16 DIAGNOSIS — Z8249 Family history of ischemic heart disease and other diseases of the circulatory system: Secondary | ICD-10-CM

## 2023-01-16 DIAGNOSIS — Z953 Presence of xenogenic heart valve: Secondary | ICD-10-CM | POA: Diagnosis not present

## 2023-01-16 DIAGNOSIS — E1122 Type 2 diabetes mellitus with diabetic chronic kidney disease: Secondary | ICD-10-CM | POA: Diagnosis not present

## 2023-01-16 DIAGNOSIS — Z7901 Long term (current) use of anticoagulants: Secondary | ICD-10-CM

## 2023-01-16 DIAGNOSIS — N179 Acute kidney failure, unspecified: Secondary | ICD-10-CM | POA: Diagnosis not present

## 2023-01-16 DIAGNOSIS — Z79899 Other long term (current) drug therapy: Secondary | ICD-10-CM | POA: Diagnosis not present

## 2023-01-16 DIAGNOSIS — I255 Ischemic cardiomyopathy: Secondary | ICD-10-CM | POA: Diagnosis present

## 2023-01-16 DIAGNOSIS — I480 Paroxysmal atrial fibrillation: Secondary | ICD-10-CM | POA: Diagnosis not present

## 2023-01-16 DIAGNOSIS — Z789 Other specified health status: Secondary | ICD-10-CM | POA: Diagnosis not present

## 2023-01-16 DIAGNOSIS — I700 Atherosclerosis of aorta: Secondary | ICD-10-CM | POA: Diagnosis not present

## 2023-01-16 DIAGNOSIS — D696 Thrombocytopenia, unspecified: Secondary | ICD-10-CM | POA: Diagnosis not present

## 2023-01-16 DIAGNOSIS — Z808 Family history of malignant neoplasm of other organs or systems: Secondary | ICD-10-CM

## 2023-01-16 DIAGNOSIS — Z7189 Other specified counseling: Secondary | ICD-10-CM | POA: Diagnosis not present

## 2023-01-16 DIAGNOSIS — N1832 Chronic kidney disease, stage 3b: Secondary | ICD-10-CM | POA: Diagnosis present

## 2023-01-16 DIAGNOSIS — I5020 Unspecified systolic (congestive) heart failure: Secondary | ICD-10-CM | POA: Diagnosis not present

## 2023-01-16 DIAGNOSIS — Z800 Family history of malignant neoplasm of digestive organs: Secondary | ICD-10-CM

## 2023-01-16 DIAGNOSIS — E785 Hyperlipidemia, unspecified: Secondary | ICD-10-CM | POA: Diagnosis present

## 2023-01-16 DIAGNOSIS — Z6826 Body mass index (BMI) 26.0-26.9, adult: Secondary | ICD-10-CM

## 2023-01-16 LAB — BASIC METABOLIC PANEL
Anion gap: 14 (ref 5–15)
BUN: 74 mg/dL — ABNORMAL HIGH (ref 8–23)
CO2: 24 mmol/L (ref 22–32)
Calcium: 8.7 mg/dL — ABNORMAL LOW (ref 8.9–10.3)
Chloride: 87 mmol/L — ABNORMAL LOW (ref 98–111)
Creatinine, Ser: 3.2 mg/dL — ABNORMAL HIGH (ref 0.61–1.24)
GFR, Estimated: 19 mL/min — ABNORMAL LOW (ref >60)
Glucose, Bld: 96 mg/dL (ref 70–99)
Potassium: 4.7 mmol/L (ref 3.5–5.1)
Sodium: 125 mmol/L — ABNORMAL LOW (ref 135–145)

## 2023-01-16 LAB — CBC
HCT: 34.2 % — ABNORMAL LOW (ref 39.0–52.0)
Hemoglobin: 11.9 g/dL — ABNORMAL LOW (ref 13.0–17.0)
MCH: 30.4 pg (ref 26.0–34.0)
MCHC: 34.8 g/dL (ref 30.0–36.0)
MCV: 87.5 fL (ref 80.0–100.0)
Platelets: 124 10*3/uL — ABNORMAL LOW (ref 150–400)
RBC: 3.91 MIL/uL — ABNORMAL LOW (ref 4.22–5.81)
RDW: 16.5 % — ABNORMAL HIGH (ref 11.5–15.5)
WBC: 5 10*3/uL (ref 4.0–10.5)
nRBC: 0 % (ref 0.0–0.2)

## 2023-01-16 LAB — PROTIME-INR
INR: 1.5 — ABNORMAL HIGH (ref 0.8–1.2)
Prothrombin Time: 18.5 seconds — ABNORMAL HIGH (ref 11.4–15.2)

## 2023-01-16 SURGERY — RIGHT/LEFT HEART CATH AND CORONARY/GRAFT ANGIOGRAPHY
Anesthesia: LOCAL

## 2023-01-16 MED ORDER — SODIUM CHLORIDE 0.9 % WEIGHT BASED INFUSION: 1.0000 mL/kg/h | INTRAVENOUS | Status: DC

## 2023-01-16 MED ORDER — DEXTROSE 5 % IV SOLN
120.0000 mg | Freq: Two times a day (BID) | INTRAVENOUS | Status: DC
Start: 2023-01-16 — End: 2023-01-16

## 2023-01-16 MED ORDER — ASPIRIN 81 MG PO TBEC
81.0000 mg | DELAYED_RELEASE_TABLET | Freq: Every day | ORAL | Status: DC
  Administered 2023-01-17 – 2023-01-24 (×8): 81 mg via ORAL
  Filled 2023-01-16 (×8): qty 1

## 2023-01-16 MED ORDER — GABAPENTIN 300 MG PO CAPS
600.0000 mg | ORAL_CAPSULE | Freq: Every day | ORAL | Status: DC
  Administered 2023-01-16 – 2023-01-23 (×8): 600 mg via ORAL
  Filled 2023-01-16 (×8): qty 2

## 2023-01-16 MED ORDER — IRON (FERROUS SULFATE) 325 (65 FE) MG PO TABS: 325.0000 mg | ORAL_TABLET | Freq: Every day | ORAL | Status: DC

## 2023-01-16 MED ORDER — ATORVASTATIN CALCIUM 80 MG PO TABS
80.0000 mg | ORAL_TABLET | Freq: Every day | ORAL | Status: DC
  Administered 2023-01-17 – 2023-01-24 (×8): 80 mg via ORAL
  Filled 2023-01-16 (×8): qty 1

## 2023-01-16 MED ORDER — FLUTICASONE FUROATE-VILANTEROL 100-25 MCG/ACT IN AEPB
1.0000 | INHALATION_SPRAY | Freq: Every day | RESPIRATORY_TRACT | Status: DC
  Administered 2023-01-17 – 2023-01-24 (×7): 1 via RESPIRATORY_TRACT
  Filled 2023-01-16 (×2): qty 28

## 2023-01-16 MED ORDER — ASPIRIN 81 MG PO CHEW: 81.0000 mg | CHEWABLE_TABLET | ORAL | Status: DC

## 2023-01-16 MED ORDER — FUROSEMIDE 10 MG/ML IJ SOLN
120.0000 mg | Freq: Two times a day (BID) | INTRAVENOUS | Status: DC
  Administered 2023-01-16 – 2023-01-18 (×6): 120 mg via INTRAVENOUS
  Filled 2023-01-16: qty 12
  Filled 2023-01-16 (×3): qty 10
  Filled 2023-01-16: qty 2
  Filled 2023-01-16: qty 12
  Filled 2023-01-16 (×2): qty 10

## 2023-01-16 MED ORDER — EZETIMIBE 10 MG PO TABS
5.0000 mg | ORAL_TABLET | Freq: Every day | ORAL | Status: DC
  Administered 2023-01-17 – 2023-01-24 (×8): 5 mg via ORAL
  Filled 2023-01-16 (×9): qty 1

## 2023-01-16 MED ORDER — SODIUM CHLORIDE 0.9 % WEIGHT BASED INFUSION
3.0000 mL/kg/h | INTRAVENOUS | Status: AC
  Administered 2023-01-16: 3 mL/kg/h via INTRAVENOUS

## 2023-01-16 MED ORDER — ACETAMINOPHEN 325 MG PO TABS
650.0000 mg | ORAL_TABLET | ORAL | Status: DC | PRN
  Administered 2023-01-16 – 2023-01-23 (×9): 650 mg via ORAL
  Filled 2023-01-16 (×9): qty 2

## 2023-01-16 MED ORDER — AMIODARONE HCL 100 MG PO TABS
100.0000 mg | ORAL_TABLET | Freq: Every day | ORAL | Status: DC
  Administered 2023-01-17 – 2023-01-24 (×8): 100 mg via ORAL
  Filled 2023-01-16 (×8): qty 1

## 2023-01-16 MED ORDER — ONDANSETRON HCL 4 MG/2ML IJ SOLN: 4.0000 mg | Freq: Four times a day (QID) | INTRAMUSCULAR | Status: DC | PRN

## 2023-01-16 MED ORDER — FE FUM-VIT C-VIT B12-FA 460-60-0.01-1 MG PO CAPS
1.0000 | ORAL_CAPSULE | Freq: Every day | ORAL | Status: DC
  Administered 2023-01-17 – 2023-01-24 (×8): 1 via ORAL
  Filled 2023-01-16 (×8): qty 1

## 2023-01-16 MED ORDER — PANTOPRAZOLE SODIUM 40 MG PO TBEC
40.0000 mg | DELAYED_RELEASE_TABLET | Freq: Every day | ORAL | Status: DC
  Administered 2023-01-17 – 2023-01-24 (×8): 40 mg via ORAL
  Filled 2023-01-16 (×8): qty 1

## 2023-01-16 MED ORDER — HEPARIN BOLUS VIA INFUSION
2000.0000 [IU] | Freq: Once | INTRAVENOUS | Status: AC
  Administered 2023-01-16: 2000 [IU] via INTRAVENOUS
  Filled 2023-01-16: qty 2000

## 2023-01-16 MED ORDER — MIDODRINE HCL 5 MG PO TABS
10.0000 mg | ORAL_TABLET | Freq: Three times a day (TID) | ORAL | Status: DC
  Administered 2023-01-16 – 2023-01-24 (×24): 10 mg via ORAL
  Filled 2023-01-16 (×24): qty 2

## 2023-01-16 MED ORDER — NITROGLYCERIN 0.4 MG SL SUBL: 0.4000 mg | SUBLINGUAL_TABLET | SUBLINGUAL | Status: DC | PRN

## 2023-01-16 MED ORDER — HEPARIN (PORCINE) 25000 UT/250ML-% IV SOLN
1250.0000 [IU]/h | INTRAVENOUS | Status: DC
  Administered 2023-01-16: 1050 [IU]/h via INTRAVENOUS
  Administered 2023-01-18 – 2023-01-19 (×2): 1250 [IU]/h via INTRAVENOUS
  Filled 2023-01-16 (×4): qty 250

## 2023-01-16 MED ORDER — ESCITALOPRAM OXALATE 10 MG PO TABS
10.0000 mg | ORAL_TABLET | Freq: Every day | ORAL | Status: DC
  Administered 2023-01-17 – 2023-01-24 (×8): 10 mg via ORAL
  Filled 2023-01-16 (×8): qty 1

## 2023-01-16 MED ORDER — GABAPENTIN 600 MG PO TABS
600.0000 mg | ORAL_TABLET | Freq: Every day | ORAL | Status: DC
  Filled 2023-01-16: qty 1

## 2023-01-16 MED ORDER — ALBUTEROL SULFATE HFA 108 (90 BASE) MCG/ACT IN AERS: 2.0000 | INHALATION_SPRAY | Freq: Four times a day (QID) | RESPIRATORY_TRACT | Status: DC | PRN

## 2023-01-16 MED ORDER — UMECLIDINIUM BROMIDE 62.5 MCG/ACT IN AEPB
1.0000 | INHALATION_SPRAY | Freq: Every day | RESPIRATORY_TRACT | Status: DC
  Administered 2023-01-17 – 2023-01-24 (×7): 1 via RESPIRATORY_TRACT
  Filled 2023-01-16 (×2): qty 7

## 2023-01-16 MED ORDER — LATANOPROST 0.005 % OP SOLN
1.0000 [drp] | Freq: Every day | OPHTHALMIC | Status: DC
  Administered 2023-01-16 – 2023-01-23 (×8): 1 [drp] via OPHTHALMIC
  Filled 2023-01-16: qty 2.5

## 2023-01-16 MED ORDER — ALBUTEROL SULFATE (2.5 MG/3ML) 0.083% IN NEBU: 3.0000 mL | INHALATION_SOLUTION | Freq: Four times a day (QID) | RESPIRATORY_TRACT | Status: DC | PRN

## 2023-01-16 NOTE — Progress Notes (Addendum)
ANTICOAGULATION CONSULT NOTE - Initial Consult  Pharmacy Consult for heparin Indication: atrial fibrillation  No Known Allergies  Patient Measurements: Height: 6\' 1"  (185.4 cm) Weight: 84.8 kg (187 lb) IBW/kg (Calculated) : 79.9 HEPARIN DW (KG): 84.8   Vital Signs: Temp: 97.9 F (36.6 C) (08/09 1157) Temp Source: Oral (08/09 1157) BP: 108/73 (08/09 1245) Pulse Rate: 50 (08/09 1245)  Labs: Recent Labs    01/14/23 1133 01/16/23 0707 01/16/23 0940  HGB 12.3*  --  11.9*  HCT 36.6*  --  34.2*  PLT 91*  --  124*  LABPROT  --  18.5*  --   INR  --  1.5*  --   CREATININE 2.37* 3.20*  --     Estimated Creatinine Clearance: 22.5 mL/min (A) (by C-G formula based on SCr of 3.2 mg/dL (H)).   Medical History: Past Medical History:  Diagnosis Date   Aortic atherosclerosis (HCC)    Arthritis    CHF (congestive heart failure) (HCC)    Esophageal stricture    Glaucoma    MI (myocardial infarction) (HCC)    Non-ST elevation (NSTEMI) myocardial infarction (HCC) 10/10/2021   NSTEMI (non-ST elevated myocardial infarction) (HCC) 10/10/2021   Osteoarthritis    Severe mitral regurgitation    Stricture and stenosis of esophagus     Medications:  Medications Prior to Admission  Medication Sig Dispense Refill Last Dose   acetaminophen (TYLENOL) 500 MG tablet Take 250 mg by mouth every 6 (six) hours as needed for moderate pain.   01/15/2023   albuterol (PROVENTIL) (2.5 MG/3ML) 0.083% nebulizer solution Inhale 3 mLs into the lungs every 6 (six) hours as needed for wheezing or shortness of breath. 90 mL 12 01/15/2023   albuterol (VENTOLIN HFA) 108 (90 Base) MCG/ACT inhaler Inhale 2 puffs into the lungs every 6 (six) hours as needed for wheezing or shortness of breath. 8 g 0 01/15/2023   amiodarone (PACERONE) 100 MG tablet TAKE ONE TABLET BY MOUTH ONCE DAILY 90 tablet 3 01/16/2023 at 0500   aspirin EC (ASPIRIN LOW DOSE) 81 MG tablet TAKE ONE TABLET BY MOUTH ONCE DAILY 90 tablet 3 01/16/2023 at 0500    atorvastatin (LIPITOR) 80 MG tablet TAKE ONE TABLET BY MOUTH EVERYDAY AT BEDTIME (Patient taking differently: Take 80 mg by mouth daily.) 90 tablet 3 01/16/2023 at 0500   cyanocobalamin (VITAMIN B12) 1000 MCG/ML injection Inject 1 mL (1,000 mcg total) into the muscle every 30 (thirty) days. 1 mL 3 Past Month   dapagliflozin propanediol (FARXIGA) 10 MG TABS tablet Take 1 tablet (10 mg total) by mouth daily. 90 tablet 1 01/15/2023   escitalopram (LEXAPRO) 10 MG tablet Take 1 tablet (10 mg total) by mouth daily. 90 tablet 1 01/15/2023   esomeprazole (NEXIUM) 40 MG capsule Take 40 mg by mouth daily.   01/15/2023   ezetimibe (ZETIA) 10 MG tablet Take 0.5 tablets (5 mg total) by mouth daily. 90 tablet 1 01/15/2023   Furosemide (FUROSCIX [place]) Inject into the skin.   01/13/2023   gabapentin (NEURONTIN) 600 MG tablet Take 1 tablet (600 mg total) by mouth at bedtime. 90 tablet 1 01/15/2023   Iron, Ferrous Sulfate, 325 (65 Fe) MG TABS Take 325 mg by mouth daily. 30 tablet 1 01/15/2023   latanoprost (XALATAN) 0.005 % ophthalmic solution Place 1 drop into both eyes at bedtime.   01/15/2023   metolazone (ZAROXOLYN) 2.5 MG tablet Take 1 tablet (2.5 mg total) by mouth daily as needed. 30 tablet 0 Past Month  midodrine (PROAMATINE) 10 MG tablet Take 1 tablet (10 mg total) by mouth 3 (three) times daily. 270 tablet 3 01/16/2023 at 0500   OVER THE COUNTER MEDICATION Apply 1 application  topically at bedtime. Medication: Mag Maxx Cream Back and restless leg   Past Week   potassium chloride SA (KLOR-CON M) 20 MEQ tablet TAKE TWO TABLETS BY MOUTH ONCE DAILY 60 tablet 6 01/15/2023 at 0800   spironolactone (ALDACTONE) 25 MG tablet TAKE ONE TABLET BY MOUTH ONCE DAILY 90 tablet 3 01/15/2023   torsemide (DEMADEX) 20 MG tablet Take 4 tablets (80 mg total) by mouth daily. (Patient taking differently: Take 40-80 mg by mouth See admin instructions. Takes 80 mg in the morning and 40 mg at night for 3 days.) 120 tablet 5 01/15/2023    Fluticasone-Umeclidin-Vilant (TRELEGY ELLIPTA) 100-62.5-25 MCG/ACT AEPB Inhale 1 puff into the lungs daily. (Patient taking differently: Inhale 1 puff into the lungs daily as needed (wheezing).) 1 each 11 More than a month   nitroGLYCERIN (NITROSTAT) 0.4 MG SL tablet Place 1 tablet (0.4 mg total) under the tongue every 5 (five) minutes as needed for chest pain. 25 tablet 3 More than a month   warfarin (COUMADIN) 1 MG tablet Take 2-3 tablets BY MOUTH daily AS DIRECTED by coumadin clinic (Patient taking differently: Take 3 mg by mouth daily.) 200 tablet 0 01/10/2023 at 0800   Scheduled:   [START ON 01/17/2023] amiodarone  100 mg Oral Daily   [START ON 01/17/2023] aspirin EC  81 mg Oral Daily   [START ON 01/17/2023] atorvastatin  80 mg Oral Daily   [START ON 01/17/2023] escitalopram  10 mg Oral Daily   [START ON 01/17/2023] ezetimibe  5 mg Oral Daily   [START ON 01/17/2023] Fe Fum-Vit C-Vit B12-FA  1 capsule Oral QPC breakfast   fluticasone furoate-vilanterol  1 puff Inhalation Daily   And   umeclidinium bromide  1 puff Inhalation Daily   gabapentin  600 mg Oral QHS   latanoprost  1 drop Both Eyes QHS   midodrine  10 mg Oral TID   [START ON 01/17/2023] pantoprazole  40 mg Oral Daily    Assessment: 77 yo male with acute chronic systolic HF on warfarin PTA. Oral anticoagulation to be on hold for cardiac cath. Pharmacy consulted to dose heparin.  -INR= 1.5 (on hold PTA for planned heart cath(  Home warfarin regimen: 3mg /day (last dose taken 8/3)  Goal of Therapy:  Heparin level 0.3-0.7 units/ml Monitor platelets by anticoagulation protocol: Yes   Plan:  -Heparin bolus 2000 units x1 then start infusion at 1050 units/hr -Heparin level in 8 hours and daily wth CBC daily  Harland German, PharmD Clinical Pharmacist **Pharmacist phone directory can now be found on [email or web address] ([email or web address]).  Listed under [facility].  e

## 2023-01-16 NOTE — Progress Notes (Signed)
Mobility Specialist Progress Note:   01/16/23 1500  Mobility  Activity Ambulated with assistance in hallway  Level of Assistance Contact guard assist, steadying assist  Assistive Device Front wheel walker  Distance Ambulated (ft) 120 ft  Activity Response Tolerated well  Mobility Referral Yes  $Mobility charge 1 Mobility  Mobility Specialist Start Time (ACUTE ONLY) 1414  Mobility Specialist Stop Time (ACUTE ONLY) 1450  Mobility Specialist Time Calculation (min) (ACUTE ONLY) 36 min    Pre Mobility: 52 HR,  99/61 BP,  99% SpO2 During Mobility: 58 HR, 89 % SpO2 Post Mobility:  54 HR,  98% SpO2  Received pt in bed having no complaints and agreeable to mobility. Usually walks with cane but has history of falls so RW was used. Pt was asymptomatic throughout ambulation and returned to room w/o fault. Left in bed w/ call bell in reach and all needs met.   [place] Earlene Plater Mobility Specialist Please contact via Special educational needs teacher or Rehab office at [phone]
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
## 2023-01-16 NOTE — Progress Notes (Signed)
Patient transferred to [facility] with no issues. Report given to [facility].

## 2023-01-16 NOTE — Progress Notes (Signed)
Orthopedic Tech Progress Note Patient Details:  Keith Jenkins 1945/07/25 [id number]  Ortho Devices Type of Ortho Device: Radio broadcast assistant Ortho Device/Splint Location: BLE Ortho Device/Splint Interventions: Ordered, Application, Adjustment   Post Interventions Patient Tolerated: Well Instructions Provided: Care of device  [place] A Gerilyn Pilgrim 01/16/2023, 3:24 PM

## 2023-01-16 NOTE — Telephone Encounter (Signed)
Lankford, Norvel Richards, RN  P Cv Div Nl Anticoag Hello All,  FYI in case you weren't aware: This patient has been instructed to hold his warfarin starting 01/11/23 until after cath tomorrow.  Thanks! Thurston Hole  Received message above. Called and spoke with pt's daughter. Scheduled appt with anticoagulation clinic post heart cath on 01/20/23 in [place] to check INR.

## 2023-01-16 NOTE — Telephone Encounter (Signed)
[name] from [facility] called wanting a verbal to continue CHF teaching for patient.  Called and left message, Verbal consent obtained.

## 2023-01-17 ENCOUNTER — Other Ambulatory Visit: Payer: Self-pay

## 2023-01-17 ENCOUNTER — Encounter (HOSPITAL_COMMUNITY): Payer: Self-pay | Admitting: Internal Medicine

## 2023-01-17 DIAGNOSIS — I5023 Acute on chronic systolic (congestive) heart failure: Secondary | ICD-10-CM | POA: Diagnosis not present

## 2023-01-17 LAB — HEPARIN LEVEL (UNFRACTIONATED)
Heparin Unfractionated: 0.16 IU/mL — ABNORMAL LOW (ref 0.30–0.70)
Heparin Unfractionated: 0.17 IU/mL — ABNORMAL LOW (ref 0.30–0.70)
Heparin Unfractionated: 0.56 IU/mL (ref 0.30–0.70)

## 2023-01-17 LAB — CBC
HCT: 33.8 % — ABNORMAL LOW (ref 39.0–52.0)
Hemoglobin: 11.7 g/dL — ABNORMAL LOW (ref 13.0–17.0)
MCH: 30.6 pg (ref 26.0–34.0)
MCHC: 34.6 g/dL (ref 30.0–36.0)
MCV: 88.5 fL (ref 80.0–100.0)
Platelets: 128 10*3/uL — ABNORMAL LOW (ref 150–400)
RBC: 3.82 MIL/uL — ABNORMAL LOW (ref 4.22–5.81)
RDW: 16.7 % — ABNORMAL HIGH (ref 11.5–15.5)
WBC: 5.6 10*3/uL (ref 4.0–10.5)
nRBC: 0 % (ref 0.0–0.2)

## 2023-01-17 LAB — BASIC METABOLIC PANEL
Anion gap: 14 (ref 5–15)
BUN: 69 mg/dL — ABNORMAL HIGH (ref 8–23)
CO2: 20 mmol/L — ABNORMAL LOW (ref 22–32)
Calcium: 8.5 mg/dL — ABNORMAL LOW (ref 8.9–10.3)
Chloride: 91 mmol/L — ABNORMAL LOW (ref 98–111)
Creatinine, Ser: 2.59 mg/dL — ABNORMAL HIGH (ref 0.61–1.24)
GFR, Estimated: 25 mL/min — ABNORMAL LOW (ref >60)
Glucose, Bld: 112 mg/dL — ABNORMAL HIGH (ref 70–99)
Potassium: 3.7 mmol/L (ref 3.5–5.1)
Sodium: 125 mmol/L — ABNORMAL LOW (ref 135–145)

## 2023-01-17 MED ORDER — HEPARIN BOLUS VIA INFUSION
2000.0000 [IU] | Freq: Once | INTRAVENOUS | Status: AC
  Administered 2023-01-17: 2000 [IU] via INTRAVENOUS
  Filled 2023-01-17: qty 2000

## 2023-01-17 MED ORDER — MELATONIN 3 MG PO TABS
3.0000 mg | ORAL_TABLET | Freq: Every day | ORAL | Status: DC
  Administered 2023-01-17 – 2023-01-23 (×7): 3 mg via ORAL
  Filled 2023-01-17 (×7): qty 1

## 2023-01-17 NOTE — Progress Notes (Signed)
ANTICOAGULATION CONSULT NOTE - Follow Up Consult  Pharmacy Consult for heparin Indication: atrial fibrillation  No Known Allergies  Patient Measurements: Height: 6\' 1"  (185.4 cm) Weight: 88.1 kg (194 lb 3.6 oz) IBW/kg (Calculated) : 79.9 HEPARIN DW (KG): 84.8   Vital Signs: Temp: 97.7 F (36.5 C) (08/10 1137) Temp Source: Oral (08/10 1137) BP: 108/69 (08/10 1137) Pulse Rate: 56 (08/10 1137)  Labs: Recent Labs    01/16/23 0707 01/16/23 0940 01/16/23 2304 01/17/23 0018 01/17/23 0746  HGB  --  11.9*  --  11.7*  --   HCT  --  34.2*  --  33.8*  --   PLT  --  124*  --  128*  --   LABPROT 18.5*  --   --   --   --   INR 1.5*  --   --   --   --   HEPARINUNFRC  --   --  0.17* 0.16* 0.56  CREATININE 3.20*  --   --  2.59*  --     Estimated Creatinine Clearance: 27.9 mL/min (A) (by C-G formula based on SCr of 2.59 mg/dL (H)).   Medical History: Past Medical History:  Diagnosis Date   Aortic atherosclerosis (HCC)    Arthritis    CHF (congestive heart failure) (HCC)    Esophageal stricture    Glaucoma    MI (myocardial infarction) (HCC)    Non-ST elevation (NSTEMI) myocardial infarction (HCC) 10/10/2021   NSTEMI (non-ST elevated myocardial infarction) (HCC) 10/10/2021   Osteoarthritis    Severe mitral regurgitation    Stricture and stenosis of esophagus     Medications:  Medications Prior to Admission  Medication Sig Dispense Refill Last Dose   acetaminophen (TYLENOL) 500 MG tablet Take 250 mg by mouth every 6 (six) hours as needed for moderate pain.   01/15/2023   albuterol (PROVENTIL) (2.5 MG/3ML) 0.083% nebulizer solution Inhale 3 mLs into the lungs every 6 (six) hours as needed for wheezing or shortness of breath. 90 mL 12 01/15/2023   albuterol (VENTOLIN HFA) 108 (90 Base) MCG/ACT inhaler Inhale 2 puffs into the lungs every 6 (six) hours as needed for wheezing or shortness of breath. 8 g 0 01/15/2023   amiodarone (PACERONE) 100 MG tablet TAKE ONE TABLET BY MOUTH ONCE  DAILY 90 tablet 3 01/16/2023 at 0500   aspirin EC (ASPIRIN LOW DOSE) 81 MG tablet TAKE ONE TABLET BY MOUTH ONCE DAILY 90 tablet 3 01/16/2023 at 0500   atorvastatin (LIPITOR) 80 MG tablet TAKE ONE TABLET BY MOUTH EVERYDAY AT BEDTIME (Patient taking differently: Take 80 mg by mouth daily.) 90 tablet 3 01/16/2023 at 0500   cyanocobalamin (VITAMIN B12) 1000 MCG/ML injection Inject 1 mL (1,000 mcg total) into the muscle every 30 (thirty) days. 1 mL 3 Past Month   dapagliflozin propanediol (FARXIGA) 10 MG TABS tablet Take 1 tablet (10 mg total) by mouth daily. 90 tablet 1 01/15/2023   escitalopram (LEXAPRO) 10 MG tablet Take 1 tablet (10 mg total) by mouth daily. 90 tablet 1 01/15/2023   esomeprazole (NEXIUM) 40 MG capsule Take 40 mg by mouth daily.   01/15/2023   ezetimibe (ZETIA) 10 MG tablet Take 0.5 tablets (5 mg total) by mouth daily. 90 tablet 1 01/15/2023   Furosemide (FUROSCIX [place]) Inject into the skin.   01/13/2023   gabapentin (NEURONTIN) 600 MG tablet Take 1 tablet (600 mg total) by mouth at bedtime. 90 tablet 1 01/15/2023   Iron, Ferrous Sulfate, 325 (65 Fe) MG TABS  Take 325 mg by mouth daily. 30 tablet 1 01/15/2023   latanoprost (XALATAN) 0.005 % ophthalmic solution Place 1 drop into both eyes at bedtime.   01/15/2023   metolazone (ZAROXOLYN) 2.5 MG tablet Take 1 tablet (2.5 mg total) by mouth daily as needed. 30 tablet 0 Past Month   midodrine (PROAMATINE) 10 MG tablet Take 1 tablet (10 mg total) by mouth 3 (three) times daily. 270 tablet 3 01/16/2023 at 0500   OVER THE COUNTER MEDICATION Apply 1 application  topically at bedtime. Medication: Mag Maxx Cream Back and restless leg   Past Week   potassium chloride SA (KLOR-CON M) 20 MEQ tablet TAKE TWO TABLETS BY MOUTH ONCE DAILY 60 tablet 6 01/15/2023 at 0800   spironolactone (ALDACTONE) 25 MG tablet TAKE ONE TABLET BY MOUTH ONCE DAILY 90 tablet 3 01/15/2023   torsemide (DEMADEX) 20 MG tablet Take 4 tablets (80 mg total) by mouth daily. (Patient taking differently: Take  40-80 mg by mouth See admin instructions. Takes 80 mg in the morning and 40 mg at night for 3 days.) 120 tablet 5 01/15/2023   Fluticasone-Umeclidin-Vilant (TRELEGY ELLIPTA) 100-62.5-25 MCG/ACT AEPB Inhale 1 puff into the lungs daily. (Patient taking differently: Inhale 1 puff into the lungs daily as needed (wheezing).) 1 each 11 More than a month   nitroGLYCERIN (NITROSTAT) 0.4 MG SL tablet Place 1 tablet (0.4 mg total) under the tongue every 5 (five) minutes as needed for chest pain. 25 tablet 3 More than a month   warfarin (COUMADIN) 1 MG tablet Take 2-3 tablets BY MOUTH daily AS DIRECTED by coumadin clinic (Patient taking differently: Take 3 mg by mouth daily.) 200 tablet 0 01/10/2023 at 0800   Scheduled:   amiodarone  100 mg Oral Daily   aspirin EC  81 mg Oral Daily   atorvastatin  80 mg Oral Daily   escitalopram  10 mg Oral Daily   ezetimibe  5 mg Oral Daily   Fe Fum-Vit C-Vit B12-FA  1 capsule Oral QPC breakfast   fluticasone furoate-vilanterol  1 puff Inhalation Daily   And   umeclidinium bromide  1 puff Inhalation Daily   gabapentin  600 mg Oral QHS   latanoprost  1 drop Both Eyes QHS   midodrine  10 mg Oral TID   pantoprazole  40 mg Oral Daily    Assessment: 77 yo male with acute chronic systolic HF on warfarin PTA. Oral anticoagulation to be on hold for cardiac cath. Pharmacy consulted to dose heparin.  -INR= 1.5 (on hold PTA for planned heart cath) Home warfarin regimen: 3mg /day (last dose taken 8/3)  Heparin drip 1250 uts/hr with heparin level at goal 0.5  Cbc stable no bleeding noted   Goal of Therapy:  Heparin level 0.3-0.7 units/ml Monitor platelets by anticoagulation protocol: Yes   Plan:  -Continue Heparin drip 1250 uts/hr  -Heparin level  daily with CBC daily   Leota Sauers Pharm.D. CPP, BCPS Clinical Pharmacist [phone] 01/17/2023 2:21 PM   **Pharmacist phone directory can now be found on [email or web address] ([email or web address]).  Listed under [facility].  e

## 2023-01-17 NOTE — Progress Notes (Signed)
ANTICOAGULATION CONSULT NOTE Pharmacy Consult for heparin Indication: atrial fibrillation Brief A/P: Heparin level subtherapeutic Increase Heparin rate  No Known Allergies  Patient Measurements: Height: 6\' 1"  (185.4 cm) Weight: 84.8 kg (187 lb) IBW/kg (Calculated) : 79.9 HEPARIN DW (KG): 84.8   Vital Signs: Temp: 97.9 F (36.6 C) (08/09 2300) Temp Source: Oral (08/09 2300) BP: 110/66 (08/10 0000) Pulse Rate: 53 (08/09 2300)  Labs: Recent Labs    01/14/23 1133 01/16/23 0707 01/16/23 0940 01/16/23 2304  HGB 12.3*  --  11.9*  --   HCT 36.6*  --  34.2*  --   PLT 91*  --  124*  --   LABPROT  --  18.5*  --   --   INR  --  1.5*  --   --   HEPARINUNFRC  --   --   --  0.17*  CREATININE 2.37* 3.20*  --   --     Estimated Creatinine Clearance: 22.5 mL/min (A) (by C-G formula based on SCr of 3.2 mg/dL (H)).  Assessment: 77 y.o. male with h/o Afib, Coumadin on hold and INR subtherapeutic, for heparin  Goal of Therapy:  Heparin level 0.3-0.7 units/ml Monitor platelets by anticoagulation protocol: Yes   Plan:  Heparin 2000 units IV bolus, then increase heparin 1250 units/hr Check heparin level in 8 hours.   Geannie Risen, PharmD, BCPS

## 2023-01-17 NOTE — Progress Notes (Signed)
Advanced Heart Failure Rounding Note   Subjective:    Admitted yesterday with volume overload and AKI.   Diuresed > 3L with IV lasix.   Scr 3.2 -> 2.6  Feeling better. Denies CP or SOB.     Objective:   Weight Range:  Vital Signs:   Temp:  [97.6 F (36.4 C)-98.6 F (37 C)] 97.6 F (36.4 C) (08/10 0835) Pulse Rate:  [50-57] 52 (08/10 0920) Resp:  [10-21] 14 (08/10 0920) BP: (87-113)/(55-75) 100/63 (08/10 0835) SpO2:  [93 %-100 %] 96 % (08/10 0920) Weight:  [88.1 kg] 88.1 kg (08/10 0134) Last BM Date : 01/28/2023  Weight change: Filed Weights   01/16/23 0716 01/17/23 0134  Weight: 84.8 kg 88.1 kg    Intake/Output:   Intake/Output Summary (Last 24 hours) at 01/17/2023 1004 Last data filed at 01/17/2023 0803 Gross per 24 hour  Intake 893.32 ml  Output 2725 ml  Net -1831.68 ml     Physical Exam: General:  Lying flat in bed No resp difficulty HEENT: normal Neck: supple. JVP to jaw . Carotids 2+ bilat; no bruits. No lymphadenopathy or thryomegaly appreciated. Cor: Distant HS Regular rate & rhythm. +AS Lungs: clear Abdomen: soft, nontender, nondistended. No hepatosplenomegaly. No bruits or masses. Good bowel sounds. Extremities: no cyanosis, clubbing, rash, tr-1+ edema + UNNA Neuro: alert & orientedx3, cranial nerves grossly intact. moves all 4 extremities w/o difficulty. Affect pleasant  Telemetry: Sinus 50-60s Personally reviewed  Labs: Basic Metabolic Panel: Recent Labs  Lab 01/12/23 1032 01/14/23 1133 01/16/23 0707 01/17/23 0018  NA 128* 127* 125* 125*  K 4.7 5.5* 4.7 3.7  CL 92* 86* 87* 91*  CO2 23 25 24  20*  GLUCOSE 81 85 96 112*  BUN 52* 60* 74* 69*  CREATININE 2.28* 2.37* 3.20* 2.59*  CALCIUM 8.6 9.1 8.7* 8.5*    Liver Function Tests: Recent Labs  Lab 01/12/23 1032  AST 41*  ALT 30  ALKPHOS 147*  BILITOT 1.2  PROT 7.0  ALBUMIN 3.6*   No results for input(s): "LIPASE", "AMYLASE" in the last 168 hours. No results for input(s):  "AMMONIA" in the last 168 hours.  CBC: Recent Labs  Lab 01/12/23 1032 01/14/23 1133 01/16/23 0940 01/17/23 0018  WBC 5.7 5.5 5.0 5.6  NEUTROABS 4.2  --   --   --   HGB 12.3* 12.3* 11.9* 11.7*  HCT 36.0* 36.6* 34.2* 33.8*  MCV 91 90 87.5 88.5  PLT 78* 91* 124* 128*    Cardiac Enzymes: No results for input(s): "CKTOTAL", "CKMB", "CKMBINDEX", "TROPONINI" in the last 168 hours.  BNP: BNP (last 3 results) Recent Labs    01/01/23 1430 01/03/23 0910 01/07/23 1923  BNP 1,924.8* 1,458.7* 2,208.6*    ProBNP (last 3 results) Recent Labs    01/12/23 1036  PROBNP 9,272*      Other results:  Imaging: No results found.   Medications:     Scheduled Medications:  amiodarone  100 mg Oral Daily   aspirin EC  81 mg Oral Daily   atorvastatin  80 mg Oral Daily   escitalopram  10 mg Oral Daily   ezetimibe  5 mg Oral Daily   Fe Fum-Vit C-Vit B12-FA  1 capsule Oral QPC breakfast   fluticasone furoate-vilanterol  1 puff Inhalation Daily   And   umeclidinium bromide  1 puff Inhalation Daily   gabapentin  600 mg Oral QHS   latanoprost  1 drop Both Eyes QHS   midodrine  10 mg Oral  TID   pantoprazole  40 mg Oral Daily    Infusions:  furosemide 120 mg (01/17/23 0803)   heparin 1,250 Units/hr (01/17/23 0110)    PRN Medications: acetaminophen, albuterol, nitroGLYCERIN, ondansetron (ZOFRAN) IV   Assessment/Plan:   Acute on chronic systolic heart failure - Echo 29/5/18 showed EF 20-25%, severe LV dysfunction with global HK, + large Left pleural effusion, RV moderately down, moderate aortic stenosis, suspect component of low gradient AS. - RHC (10/23): Elevated filling pressures with moderately decreased cardiac output. RA 8, PA 48/16, PCWP 22, CO/CI (Fick) 5.7/2.7. - Echo 7/24: EF 20-25%, RV mildly reduced, RA/LA severely dilated, trivial MR, no AV regurgitation - Felt not to be a suitable VAD candidate (Drs. Gala Romney & Bartle discussed). Patient declined VAD as well. -  NYHA IV. Volume overloaded on admission.   - Volume status and renal function improving. Will continue.  - low threshold to add DBA support  - GDMT on hold with AKI and hypotension - Continue midodrine 10 mg tid. - Continue Unna boots - He has end-stage HF and severe AS now with volume overload and AKI. Admitted for diuresis and possible inotropic support. I am worried that the main issue may be his cardiomyopathy and that TAVR might not be enough to improve his condition. Continue aggressive care for now -We discussed possible need for Palliative Care.      Severe aortic stenosis, LVLG AS - Moderate on echo 10/23  - ?? Seen on 7/24 echo - Admitted for TAVR eval, needs volume optimization first.  - L/R heart cath pending renal function improvement - See discussion above   AKI on CKD stage 4 - SCr 3.2, baseline 1.8-2.1 - Scr improving with diuresis 3.2 -> 2.6 - Avoid hypotension - Add DBA as needed   CAD s/p late presentation inferior STEMI - LHC 05/23: 100% m RCA and 90% m LAD.  - RCA managed medically d/t completed infarct.  - Echo (5/23) EF 25%, RV moderately reduced, moderate to severe MR - Echo (10/23): EF 20-25%, RV moderately down. - s/p CABG x 2 (LIMA-LAD, SVG-Ramus)  (11/01/21) - No s/s angina - Continue ASA + high intensity statin   Mitral regurgitation - Moderate to severe ischemic MR on TEE.  - S/p bioprosthetic MVR 11/02/21. - Warfarin on hold, start heparin drip   Hyponatremia - Na 125 today - Restrict free water, consider tolvaptan   H/O pericardial effusion/pericarditis - Moderate in size on past echo, w/o tamponade. - Acute pericarditis/ adhensions noted at time of CABG. - Resolved on echo 10/23 and not seen on echo 7/24   PAF - s/p MAZE w/ CABG  - Continue amiodarone 100 mg daily.  - Heparin drip until procedures complete     Length of Stay: 1   Arvilla Meres MD 01/17/2023, 10:04 AM  Advanced Heart Failure Team Pager [phone] (M-F; 7a - 4p)   Please contact [facility] for night-coverage after hours (4p -7a ) and weekends on [email or web address]

## 2023-01-18 DIAGNOSIS — I5023 Acute on chronic systolic (congestive) heart failure: Secondary | ICD-10-CM | POA: Diagnosis not present

## 2023-01-18 LAB — BASIC METABOLIC PANEL WITH GFR
Anion gap: 16 — ABNORMAL HIGH (ref 5–15)
BUN: 65 mg/dL — ABNORMAL HIGH (ref 8–23)
CO2: 22 mmol/L (ref 22–32)
Calcium: 8.3 mg/dL — ABNORMAL LOW (ref 8.9–10.3)
Chloride: 86 mmol/L — ABNORMAL LOW (ref 98–111)
Creatinine, Ser: 2.47 mg/dL — ABNORMAL HIGH (ref 0.61–1.24)
GFR, Estimated: 27 mL/min — ABNORMAL LOW (ref >60)
Glucose, Bld: 77 mg/dL (ref 70–99)
Potassium: 3.6 mmol/L (ref 3.5–5.1)
Sodium: 124 mmol/L — ABNORMAL LOW (ref 135–145)

## 2023-01-18 LAB — CBC
HCT: 33.1 % — ABNORMAL LOW (ref 39.0–52.0)
Hemoglobin: 11.4 g/dL — ABNORMAL LOW (ref 13.0–17.0)
MCH: 30.5 pg (ref 26.0–34.0)
MCHC: 34.4 g/dL (ref 30.0–36.0)
MCV: 88.5 fL (ref 80.0–100.0)
Platelets: 98 10*3/uL — ABNORMAL LOW (ref 150–400)
RBC: 3.74 MIL/uL — ABNORMAL LOW (ref 4.22–5.81)
RDW: 16.7 % — ABNORMAL HIGH (ref 11.5–15.5)
WBC: 5.6 10*3/uL (ref 4.0–10.5)
nRBC: 0 % (ref 0.0–0.2)

## 2023-01-18 LAB — HEPARIN LEVEL (UNFRACTIONATED): Heparin Unfractionated: 0.44 [IU]/mL (ref 0.30–0.70)

## 2023-01-18 MED ORDER — DOBUTAMINE-DEXTROSE 4-5 MG/ML-% IV SOLN
2.5000 ug/kg/min | INTRAVENOUS | Status: DC
  Administered 2023-01-18 – 2023-01-21 (×2): 2.5 ug/kg/min via INTRAVENOUS
  Filled 2023-01-18 (×2): qty 250

## 2023-01-18 MED ORDER — POTASSIUM CHLORIDE CRYS ER 20 MEQ PO TBCR
40.0000 meq | EXTENDED_RELEASE_TABLET | Freq: Once | ORAL | Status: AC
  Administered 2023-01-18: 40 meq via ORAL
  Filled 2023-01-18: qty 2

## 2023-01-18 NOTE — Progress Notes (Signed)
A consult was placed to IV Therapy for more IV access; pt noted to have multiple skin tears, with gauze dressings on both arms;  significant bruising noted bilaterally also;  very poor skin turgor and peripheral access;  please consider central access for this pt if additional IV access is needed.  Thank you!

## 2023-01-18 NOTE — Plan of Care (Signed)

## 2023-01-18 NOTE — Progress Notes (Signed)
ANTICOAGULATION CONSULT NOTE - Follow Up Consult  Pharmacy Consult for heparin Indication: atrial fibrillation  No Known Allergies  Patient Measurements: Height: 6\' 1"  (185.4 cm) Weight: 90.6 kg (199 lb 11.8 oz) IBW/kg (Calculated) : 79.9 HEPARIN DW (KG): 84.8   Vital Signs: Temp: 98.5 F (36.9 C) (08/11 1115) Temp Source: Oral (08/11 1115) BP: 96/67 (08/11 1119) Pulse Rate: 52 (08/11 0741)  Labs: Recent Labs     0000 01/16/23 0707 01/16/23 0940 01/16/23 2304 01/17/23 0018 01/17/23 0746 01/18/23 0031  HGB   < >  --  11.9*  --  11.7*  --  11.4*  HCT  --   --  34.2*  --  33.8*  --  33.1*  PLT  --   --  124*  --  128*  --  98*  LABPROT  --  18.5*  --   --   --   --   --   INR  --  1.5*  --   --   --   --   --   HEPARINUNFRC  --   --   --    < > 0.16* 0.56 0.44  CREATININE  --  3.20*  --   --  2.59*  --  2.47*   < > = values in this interval not displayed.    Estimated Creatinine Clearance: 29.2 mL/min (A) (by C-G formula based on SCr of 2.47 mg/dL (H)).   Medical History: Past Medical History:  Diagnosis Date   Aortic atherosclerosis (HCC)    Arthritis    CHF (congestive heart failure) (HCC)    Esophageal stricture    Glaucoma    MI (myocardial infarction) (HCC)    Non-ST elevation (NSTEMI) myocardial infarction (HCC) 10/10/2021   NSTEMI (non-ST elevated myocardial infarction) (HCC) 10/10/2021   Osteoarthritis    Severe mitral regurgitation    Stricture and stenosis of esophagus     Medications:  Medications Prior to Admission  Medication Sig Dispense Refill Last Dose   acetaminophen (TYLENOL) 500 MG tablet Take 250 mg by mouth every 6 (six) hours as needed for moderate pain.   01/15/2023   albuterol (PROVENTIL) (2.5 MG/3ML) 0.083% nebulizer solution Inhale 3 mLs into the lungs every 6 (six) hours as needed for wheezing or shortness of breath. 90 mL 12 01/15/2023   albuterol (VENTOLIN HFA) 108 (90 Base) MCG/ACT inhaler Inhale 2 puffs into the lungs every 6  (six) hours as needed for wheezing or shortness of breath. 8 g 0 01/15/2023   amiodarone (PACERONE) 100 MG tablet TAKE ONE TABLET BY MOUTH ONCE DAILY 90 tablet 3 01/16/2023 at 0500   aspirin EC (ASPIRIN LOW DOSE) 81 MG tablet TAKE ONE TABLET BY MOUTH ONCE DAILY 90 tablet 3 01/16/2023 at 0500   atorvastatin (LIPITOR) 80 MG tablet TAKE ONE TABLET BY MOUTH EVERYDAY AT BEDTIME (Patient taking differently: Take 80 mg by mouth daily.) 90 tablet 3 01/16/2023 at 0500   cyanocobalamin (VITAMIN B12) 1000 MCG/ML injection Inject 1 mL (1,000 mcg total) into the muscle every 30 (thirty) days. 1 mL 3 Past Month   dapagliflozin propanediol (FARXIGA) 10 MG TABS tablet Take 1 tablet (10 mg total) by mouth daily. 90 tablet 1 01/15/2023   escitalopram (LEXAPRO) 10 MG tablet Take 1 tablet (10 mg total) by mouth daily. 90 tablet 1 01/15/2023   esomeprazole (NEXIUM) 40 MG capsule Take 40 mg by mouth daily.   01/15/2023   ezetimibe (ZETIA) 10 MG tablet Take 0.5 tablets (5 mg  total) by mouth daily. 90 tablet 1 01/15/2023   Furosemide (FUROSCIX [place]) Inject into the skin.   01/13/2023   gabapentin (NEURONTIN) 600 MG tablet Take 1 tablet (600 mg total) by mouth at bedtime. 90 tablet 1 01/15/2023   Iron, Ferrous Sulfate, 325 (65 Fe) MG TABS Take 325 mg by mouth daily. 30 tablet 1 01/15/2023   latanoprost (XALATAN) 0.005 % ophthalmic solution Place 1 drop into both eyes at bedtime.   01/15/2023   metolazone (ZAROXOLYN) 2.5 MG tablet Take 1 tablet (2.5 mg total) by mouth daily as needed. 30 tablet 0 Past Month   midodrine (PROAMATINE) 10 MG tablet Take 1 tablet (10 mg total) by mouth 3 (three) times daily. 270 tablet 3 01/16/2023 at 0500   OVER THE COUNTER MEDICATION Apply 1 application  topically at bedtime. Medication: Mag Maxx Cream Back and restless leg   Past Week   potassium chloride SA (KLOR-CON M) 20 MEQ tablet TAKE TWO TABLETS BY MOUTH ONCE DAILY 60 tablet 6 01/15/2023 at 0800   spironolactone (ALDACTONE) 25 MG tablet TAKE ONE TABLET BY MOUTH ONCE  DAILY 90 tablet 3 01/15/2023   torsemide (DEMADEX) 20 MG tablet Take 4 tablets (80 mg total) by mouth daily. (Patient taking differently: Take 40-80 mg by mouth See admin instructions. Takes 80 mg in the morning and 40 mg at night for 3 days.) 120 tablet 5 01/15/2023   Fluticasone-Umeclidin-Vilant (TRELEGY ELLIPTA) 100-62.5-25 MCG/ACT AEPB Inhale 1 puff into the lungs daily. (Patient taking differently: Inhale 1 puff into the lungs daily as needed (wheezing).) 1 each 11 More than a month   nitroGLYCERIN (NITROSTAT) 0.4 MG SL tablet Place 1 tablet (0.4 mg total) under the tongue every 5 (five) minutes as needed for chest pain. 25 tablet 3 More than a month   warfarin (COUMADIN) 1 MG tablet Take 2-3 tablets BY MOUTH daily AS DIRECTED by coumadin clinic (Patient taking differently: Take 3 mg by mouth daily.) 200 tablet 0 01/10/2023 at 0800   Scheduled:   amiodarone  100 mg Oral Daily   aspirin EC  81 mg Oral Daily   atorvastatin  80 mg Oral Daily   escitalopram  10 mg Oral Daily   ezetimibe  5 mg Oral Daily   Fe Fum-Vit C-Vit B12-FA  1 capsule Oral QPC breakfast   fluticasone furoate-vilanterol  1 puff Inhalation Daily   And   umeclidinium bromide  1 puff Inhalation Daily   gabapentin  600 mg Oral QHS   latanoprost  1 drop Both Eyes QHS   melatonin  3 mg Oral QHS   midodrine  10 mg Oral TID   pantoprazole  40 mg Oral Daily    Assessment: 77 yo male with acute chronic systolic HF on warfarin PTA. Oral anticoagulation to be on hold for cardiac cath. Pharmacy consulted to dose heparin.  -INR= 1.5 (on hold PTA for planned heart cath) Home warfarin regimen: 3mg /day (last dose taken 8/3)  Heparin drip 1250 uts/hr with heparin level at goal 0.4 Cbc stable no bleeding noted   Goal of Therapy:  Heparin level 0.3-0.7 units/ml Monitor platelets by anticoagulation protocol: Yes   Plan:  -Continue Heparin drip 1250 uts/hr  -Heparin level  daily with CBC daily   Leota Sauers Pharm.D. CPP,  BCPS Clinical Pharmacist [phone] 01/18/2023 1:36 PM   **Pharmacist phone directory can now be found on [email or web address] ([email or web address]).  Listed under [facility].  e

## 2023-01-18 NOTE — Progress Notes (Signed)
Patient refused bed alarm. Educated on the risks. Turned bed alarm off.

## 2023-01-18 NOTE — Progress Notes (Signed)
Advanced Heart Failure Rounding Note   Subjective:    Admitted with volume overload and AKI.   Remains on IV lasix. Out 2L.   Denies CP or SOB. SBP 70-90s  SCr 3.2 -> 2.6 -> 2.4 Sodium 124   Objective:   Weight Range:  Vital Signs:   Temp:  [97.7 F (36.5 C)-98.5 F (36.9 C)] 98.4 F (36.9 C) (08/11 0738) Pulse Rate:  [51-57] 52 (08/11 0741) Resp:  [14-20] 14 (08/11 0741) BP: (81-115)/(47-81) 92/55 (08/11 0741) SpO2:  [94 %-100 %] 98 % (08/11 0741) Weight:  [90.6 kg] 90.6 kg (08/11 0338) Last BM Date : 01/31/2023  Weight change: Filed Weights   01/16/23 0716 01/17/23 0134 01/18/23 0338  Weight: 84.8 kg 88.1 kg 90.6 kg    Intake/Output:   Intake/Output Summary (Last 24 hours) at 01/18/2023 1015 Last data filed at 01/18/2023 0815 Gross per 24 hour  Intake 360 ml  Output 2525 ml  Net -2165 ml     Physical Exam: General: Lying in bed . No resp difficulty HEENT: normal Neck: supple. JVP to ear Carotids 2+ bilat; no bruits. No lymphadenopathy or thryomegaly appreciated. Cor: Distant HS. Regular. Soft AS Lungs: clear Abdomen: soft, nontender, nondistended. No hepatosplenomegaly. No bruits or masses. Good bowel sounds. Extremities: no cyanosis, clubbing, rash, edema Neuro: alert & orientedx3, cranial nerves grossly intact. moves all 4 extremities w/o difficulty. Affect pleasant  Telemetry: Sinus 50-60s Personally reviewed  Labs: Basic Metabolic Panel: Recent Labs  Lab 01/12/23 1032 01/14/23 1133 01/16/23 0707 01/17/23 0018 01/18/23 0031  NA 128* 127* 125* 125* 124*  K 4.7 5.5* 4.7 3.7 3.6  CL 92* 86* 87* 91* 86*  CO2 23 25 24  20* 22  GLUCOSE 81 85 96 112* 77  BUN 52* 60* 74* 69* 65*  CREATININE 2.28* 2.37* 3.20* 2.59* 2.47*  CALCIUM 8.6 9.1 8.7* 8.5* 8.3*    Liver Function Tests: Recent Labs  Lab 01/12/23 1032  AST 41*  ALT 30  ALKPHOS 147*  BILITOT 1.2  PROT 7.0  ALBUMIN 3.6*   No results for input(s): "LIPASE", "AMYLASE" in the last  168 hours. No results for input(s): "AMMONIA" in the last 168 hours.  CBC: Recent Labs  Lab 01/12/23 1032 01/14/23 1133 01/16/23 0940 01/17/23 0018 01/18/23 0031  WBC 5.7 5.5 5.0 5.6 5.6  NEUTROABS 4.2  --   --   --   --   HGB 12.3* 12.3* 11.9* 11.7* 11.4*  HCT 36.0* 36.6* 34.2* 33.8* 33.1*  MCV 91 90 87.5 88.5 88.5  PLT 78* 91* 124* 128* 98*    Cardiac Enzymes: No results for input(s): "CKTOTAL", "CKMB", "CKMBINDEX", "TROPONINI" in the last 168 hours.  BNP: BNP (last 3 results) Recent Labs    01/01/23 1430 01/03/23 0910 01/07/23 1923  BNP 1,924.8* 1,458.7* 2,208.6*    ProBNP (last 3 results) Recent Labs    01/12/23 1036  PROBNP 9,272*      Other results:  Imaging: No results found.   Medications:     Scheduled Medications:  amiodarone  100 mg Oral Daily   aspirin EC  81 mg Oral Daily   atorvastatin  80 mg Oral Daily   escitalopram  10 mg Oral Daily   ezetimibe  5 mg Oral Daily   Fe Fum-Vit C-Vit B12-FA  1 capsule Oral QPC breakfast   fluticasone furoate-vilanterol  1 puff Inhalation Daily   And   umeclidinium bromide  1 puff Inhalation Daily   gabapentin  600 mg  Oral QHS   latanoprost  1 drop Both Eyes QHS   melatonin  3 mg Oral QHS   midodrine  10 mg Oral TID   pantoprazole  40 mg Oral Daily    Infusions:  furosemide 120 mg (01/18/23 0738)   heparin 1,250 Units/hr (01/18/23 0416)    PRN Medications: acetaminophen, albuterol, nitroGLYCERIN, ondansetron (ZOFRAN) IV   Assessment/Plan:   Acute on chronic systolic heart failure - Echo 13/2/44 showed EF 20-25%, severe LV dysfunction with global HK, + large Left pleural effusion, RV moderately down, moderate aortic stenosis, suspect component of low gradient AS. - RHC (10/23): Elevated filling pressures with moderately decreased cardiac output. RA 8, PA 48/16, PCWP 22, CO/CI (Fick) 5.7/2.7. - Echo 7/24: EF 20-25%, RV mildly reduced, RA/LA severely dilated, trivial MR, no AV regurgitation -  Felt not to be a suitable VAD candidate (Drs. Gala Romney & Bartle discussed). Patient declined VAD as well. - NYHA IV. Volume overloaded on admission.   - Volume status and renal function improving. I think volume status is optimized. Will hold lasix.  - MAPs borderline in setting of AS despite midodrine - Will add DBA 2.5 - GDMT on hold with AKI and hypotension - Continue midodrine 10 mg tid. - Continue Unna boots - He has end-stage HF and severe AS now with volume overload and AKI. Admitted for diuresis and possible inotropic support. I am worried that the main issue may be his cardiomyopathy and that TAVR might not be enough to improve his condition. Continue aggressive care for now -We discussed possible need for Palliative Care.    Severe aortic stenosis, LVLG AS - Moderate on echo 10/23  - ?? Seen on 7/24 echo - Admitted for TAVR eval, needs volume optimization first.  - L/R heart cath pending renal function improvement - See discussion above   AKI on CKD stage 4 - SCr 3.2, baseline 1.8-2.1 - Scr improving with diuresis 3.2 -> 2.6 -> 2.4 - Avoid hypotension - Add DBA as above   CAD s/p late presentation inferior STEMI - LHC 05/23: 100% m RCA and 90% m LAD.  - RCA managed medically d/t completed infarct.  - Echo (5/23) EF 25%, RV moderately reduced, moderate to severe MR - Echo (10/23): EF 20-25%, RV moderately down. - s/p CABG x 2 (LIMA-LAD, SVG-Ramus)  (11/01/21) - No s/s angina - Continue ASA + high intensity statin   Mitral regurgitation - Moderate to severe ischemic MR on TEE.  - S/p bioprosthetic MVR 11/02/21. - Warfarin on hold, start heparin drip   Hyponatremia - Na 124 today - Restrict free water, consider tolvaptan if not improved tomorrow   H/O pericardial effusion/pericarditis - Moderate in size on past echo, w/o tamponade. - Acute pericarditis/ adhensions noted at time of CABG. - Resolved on echo 10/23 and not seen on echo 7/24   PAF - s/p MAZE w/ CABG   - Continue amiodarone 100 mg daily.  - Remains in NSR. Heparin drip until procedures complete    Length of Stay: 2   Arvilla Meres MD 01/18/2023, 10:15 AM  Advanced Heart Failure Team Pager [phone] (M-F; 7a - 4p)  Please contact [facility] for night-coverage after hours (4p -7a ) and weekends on [email or web address]

## 2023-01-18 NOTE — Progress Notes (Signed)
Assisted patient to the restroom. Stood in patient's room because I did not want to leave him unattended. Patient dismissed me stating that he will call me when he gets done and cannot go with me in the room. Educated on how to pull restroom call bell string. Changed bed pads and left.

## 2023-01-19 ENCOUNTER — Encounter (HOSPITAL_COMMUNITY)
Admission: AD | Disposition: A | Discharge status: Hospice | Payer: Self-pay | Source: Home / Self Care | Attending: Internal Medicine

## 2023-01-19 ENCOUNTER — Other Ambulatory Visit: Payer: Self-pay

## 2023-01-19 DIAGNOSIS — I5023 Acute on chronic systolic (congestive) heart failure: Secondary | ICD-10-CM | POA: Diagnosis not present

## 2023-01-19 DIAGNOSIS — G2581 Restless legs syndrome: Secondary | ICD-10-CM

## 2023-01-19 DIAGNOSIS — F320 Major depressive disorder, single episode, mild: Secondary | ICD-10-CM

## 2023-01-19 HISTORY — PX: CENTRAL LINE INSERTION-TUNNELED: CATH118291

## 2023-01-19 HISTORY — PX: CENTRAL LINE INSERTION: CATH118232

## 2023-01-19 HISTORY — PX: RIGHT HEART CATH: CATH118263

## 2023-01-19 LAB — POCT I-STAT EG7
Acid-Base Excess: 0 mmol/L (ref 0.0–2.0)
Acid-Base Excess: 0 mmol/L (ref 0.0–2.0)
Bicarbonate: 24.3 mmol/L (ref 20.0–28.0)
Bicarbonate: 24.5 mmol/L (ref 20.0–28.0)
Calcium, Ion: 1.09 mmol/L — ABNORMAL LOW (ref 1.15–1.40)
Calcium, Ion: 1.1 mmol/L — ABNORMAL LOW (ref 1.15–1.40)
HCT: 33 % — ABNORMAL LOW (ref 39.0–52.0)
HCT: 34 % — ABNORMAL LOW (ref 39.0–52.0)
Hemoglobin: 11.2 g/dL — ABNORMAL LOW (ref 13.0–17.0)
Hemoglobin: 11.6 g/dL — ABNORMAL LOW (ref 13.0–17.0)
O2 Saturation: 63 %
O2 Saturation: 65 %
Potassium: 4 mmol/L (ref 3.5–5.1)
Potassium: 4 mmol/L (ref 3.5–5.1)
Sodium: 124 mmol/L — ABNORMAL LOW (ref 135–145)
Sodium: 124 mmol/L — ABNORMAL LOW (ref 135–145)
TCO2: 25 mmol/L (ref 22–32)
TCO2: 26 mmol/L (ref 22–32)
pCO2, Ven: 37.9 mmHg — ABNORMAL LOW (ref 44–60)
pCO2, Ven: 37.9 mmHg — ABNORMAL LOW (ref 44–60)
pH, Ven: 7.415 (ref 7.25–7.43)
pH, Ven: 7.419 (ref 7.25–7.43)
pO2, Ven: 32 mmHg (ref 32–45)
pO2, Ven: 33 mmHg (ref 32–45)

## 2023-01-19 LAB — HEPATIC FUNCTION PANEL
ALT: 27 U/L (ref 0–44)
AST: 35 U/L (ref 15–41)
Albumin: 3 g/dL — ABNORMAL LOW (ref 3.5–5.0)
Alkaline Phosphatase: 86 U/L (ref 38–126)
Bilirubin, Direct: 0.5 mg/dL — ABNORMAL HIGH (ref 0.0–0.2)
Indirect Bilirubin: 0.9 mg/dL (ref 0.3–0.9)
Total Bilirubin: 1.4 mg/dL — ABNORMAL HIGH (ref 0.3–1.2)
Total Protein: 6.6 g/dL (ref 6.5–8.1)

## 2023-01-19 LAB — BASIC METABOLIC PANEL
Anion gap: 13 (ref 5–15)
BUN: 65 mg/dL — ABNORMAL HIGH (ref 8–23)
CO2: 20 mmol/L — ABNORMAL LOW (ref 22–32)
Calcium: 8.2 mg/dL — ABNORMAL LOW (ref 8.9–10.3)
Chloride: 87 mmol/L — ABNORMAL LOW (ref 98–111)
Creatinine, Ser: 2.77 mg/dL — ABNORMAL HIGH (ref 0.61–1.24)
GFR, Estimated: 23 mL/min — ABNORMAL LOW (ref >60)
Glucose, Bld: 134 mg/dL — ABNORMAL HIGH (ref 70–99)
Potassium: 3.8 mmol/L (ref 3.5–5.1)
Sodium: 120 mmol/L — ABNORMAL LOW (ref 135–145)

## 2023-01-19 LAB — SODIUM: Sodium: 122 mmol/L — ABNORMAL LOW (ref 135–145)

## 2023-01-19 SURGERY — RIGHT HEART CATH
Anesthesia: LOCAL | Site: Neck | Laterality: Right

## 2023-01-19 MED ORDER — ASPIRIN 81 MG PO CHEW: 81.0000 mg | CHEWABLE_TABLET | ORAL | Status: DC

## 2023-01-19 MED ORDER — GABAPENTIN 600 MG PO TABS: 600.0000 mg | ORAL_TABLET | Freq: Every day | ORAL | 1 refills | Status: DC

## 2023-01-19 MED ORDER — CHLORHEXIDINE GLUCONATE CLOTH 2 % EX PADS
6.0000 | MEDICATED_PAD | Freq: Every day | CUTANEOUS | Status: DC
  Administered 2023-01-19 – 2023-01-24 (×6): 6 via TOPICAL

## 2023-01-19 MED ORDER — LIDOCAINE HCL (PF) 1 % IJ SOLN
INTRAMUSCULAR | Status: DC | PRN
  Administered 2023-01-19: 2 mL

## 2023-01-19 MED ORDER — HEPARIN (PORCINE) IN NACL 1000-0.9 UT/500ML-% IV SOLN
INTRAVENOUS | Status: DC | PRN
  Administered 2023-01-19: 500 mL

## 2023-01-19 MED ORDER — ESCITALOPRAM OXALATE 10 MG PO TABS
10.0000 mg | ORAL_TABLET | Freq: Every day | ORAL | 1 refills | Status: DC
Start: 2023-01-19 — End: 2023-02-04

## 2023-01-19 MED ORDER — TOLVAPTAN 15 MG PO TABS
15.0000 mg | ORAL_TABLET | Freq: Once | ORAL | Status: AC
  Administered 2023-01-19: 15 mg via ORAL
  Filled 2023-01-19 (×2): qty 1

## 2023-01-19 MED ORDER — LIDOCAINE HCL (PF) 1 % IJ SOLN
INTRAMUSCULAR | Status: AC
  Filled 2023-01-19: qty 30

## 2023-01-19 MED ORDER — SODIUM CHLORIDE 0.9 % IV SOLN: INTRAVENOUS | Status: DC

## 2023-01-19 SURGICAL SUPPLY — 6 items
CATH SWAN GANZ 7F STRAIGHT (CATHETERS) IMPLANT
KIT CV 3L 7FR 20CM SULFAFREE (SET/KITS/TRAYS/PACK) IMPLANT
PACK CARDIAC CATHETERIZATION (CUSTOM PROCEDURE TRAY) ×2 IMPLANT
SHEATH PINNACLE 7F 10CM (SHEATH) IMPLANT
TRANSDUCER W/STOPCOCK (MISCELLANEOUS) ×2 IMPLANT
WIRE EMERALD 3MM-J .025X260CM (WIRE) IMPLANT

## 2023-01-19 NOTE — Plan of Care (Signed)

## 2023-01-19 NOTE — Progress Notes (Signed)
ANTICOAGULATION CONSULT NOTE - Follow Up Consult  Pharmacy Consult for heparin Indication: atrial fibrillation  No Known Allergies  Patient Measurements: Height: 6\' 1"  (185.4 cm) Weight: 90.1 kg (198 lb 10.2 oz) IBW/kg (Calculated) : 79.9 HEPARIN DW (KG): 84.8   Vital Signs: Temp: 97.7 F (36.5 C) (08/12 0830) Temp Source: Axillary (08/12 0830) BP: 89/59 (08/12 0830) Pulse Rate: 56 (08/12 0830)  Labs: Recent Labs    01/17/23 0018 01/17/23 0746 01/18/23 0031 01/19/23 0028  HGB 11.7*  --  11.4* 10.8*  HCT 33.8*  --  33.1* 30.3*  PLT 128*  --  98* 107*  HEPARINUNFRC 0.16* 0.56 0.44 0.51  CREATININE 2.59*  --  2.47* 2.97*    Estimated Creatinine Clearance: 24.3 mL/min (A) (by C-G formula based on SCr of 2.97 mg/dL (H)).   Medical History: Past Medical History:  Diagnosis Date   Aortic atherosclerosis (HCC)    Arthritis    CHF (congestive heart failure) (HCC)    Esophageal stricture    Glaucoma    MI (myocardial infarction) (HCC)    Non-ST elevation (NSTEMI) myocardial infarction (HCC) 10/10/2021   NSTEMI (non-ST elevated myocardial infarction) (HCC) 10/10/2021   Osteoarthritis    Severe mitral regurgitation    Stricture and stenosis of esophagus     Medications:  Medications Prior to Admission  Medication Sig Dispense Refill Last Dose   acetaminophen (TYLENOL) 500 MG tablet Take 250 mg by mouth every 6 (six) hours as needed for moderate pain.   01/15/2023   albuterol (PROVENTIL) (2.5 MG/3ML) 0.083% nebulizer solution Inhale 3 mLs into the lungs every 6 (six) hours as needed for wheezing or shortness of breath. 90 mL 12 01/15/2023   albuterol (VENTOLIN HFA) 108 (90 Base) MCG/ACT inhaler Inhale 2 puffs into the lungs every 6 (six) hours as needed for wheezing or shortness of breath. 8 g 0 01/15/2023   amiodarone (PACERONE) 100 MG tablet TAKE ONE TABLET BY MOUTH ONCE DAILY 90 tablet 3 01/16/2023 at 0500   aspirin EC (ASPIRIN LOW DOSE) 81 MG tablet TAKE ONE TABLET BY  MOUTH ONCE DAILY 90 tablet 3 01/16/2023 at 0500   atorvastatin (LIPITOR) 80 MG tablet TAKE ONE TABLET BY MOUTH EVERYDAY AT BEDTIME (Patient taking differently: Take 80 mg by mouth daily.) 90 tablet 3 01/16/2023 at 0500   cyanocobalamin (VITAMIN B12) 1000 MCG/ML injection Inject 1 mL (1,000 mcg total) into the muscle every 30 (thirty) days. 1 mL 3 Past Month   dapagliflozin propanediol (FARXIGA) 10 MG TABS tablet Take 1 tablet (10 mg total) by mouth daily. 90 tablet 1 01/15/2023   escitalopram (LEXAPRO) 10 MG tablet Take 1 tablet (10 mg total) by mouth daily. 90 tablet 1 01/15/2023   esomeprazole (NEXIUM) 40 MG capsule Take 40 mg by mouth daily.   01/15/2023   ezetimibe (ZETIA) 10 MG tablet Take 0.5 tablets (5 mg total) by mouth daily. 90 tablet 1 01/15/2023   Furosemide (FUROSCIX [place]) Inject into the skin.   01/13/2023   gabapentin (NEURONTIN) 600 MG tablet Take 1 tablet (600 mg total) by mouth at bedtime. 90 tablet 1 01/15/2023   Iron, Ferrous Sulfate, 325 (65 Fe) MG TABS Take 325 mg by mouth daily. 30 tablet 1 01/15/2023   latanoprost (XALATAN) 0.005 % ophthalmic solution Place 1 drop into both eyes at bedtime.   01/15/2023   metolazone (ZAROXOLYN) 2.5 MG tablet Take 1 tablet (2.5 mg total) by mouth daily as needed. 30 tablet 0 Past Month   midodrine (PROAMATINE) 10  MG tablet Take 1 tablet (10 mg total) by mouth 3 (three) times daily. 270 tablet 3 01/16/2023 at 0500   OVER THE COUNTER MEDICATION Apply 1 application  topically at bedtime. Medication: Mag Maxx Cream Back and restless leg   Past Week   potassium chloride SA (KLOR-CON M) 20 MEQ tablet TAKE TWO TABLETS BY MOUTH ONCE DAILY 60 tablet 6 01/15/2023 at 0800   spironolactone (ALDACTONE) 25 MG tablet TAKE ONE TABLET BY MOUTH ONCE DAILY 90 tablet 3 01/15/2023   torsemide (DEMADEX) 20 MG tablet Take 4 tablets (80 mg total) by mouth daily. (Patient taking differently: Take 40-80 mg by mouth See admin instructions. Takes 80 mg in the morning and 40 mg at night for 3 days.)  120 tablet 5 01/15/2023   Fluticasone-Umeclidin-Vilant (TRELEGY ELLIPTA) 100-62.5-25 MCG/ACT AEPB Inhale 1 puff into the lungs daily. (Patient taking differently: Inhale 1 puff into the lungs daily as needed (wheezing).) 1 each 11 More than a month   nitroGLYCERIN (NITROSTAT) 0.4 MG SL tablet Place 1 tablet (0.4 mg total) under the tongue every 5 (five) minutes as needed for chest pain. 25 tablet 3 More than a month   warfarin (COUMADIN) 1 MG tablet Take 2-3 tablets BY MOUTH daily AS DIRECTED by coumadin clinic (Patient taking differently: Take 3 mg by mouth daily.) 200 tablet 0 01/10/2023 at 0800   Scheduled:   amiodarone  100 mg Oral Daily   aspirin EC  81 mg Oral Daily   atorvastatin  80 mg Oral Daily   escitalopram  10 mg Oral Daily   ezetimibe  5 mg Oral Daily   Fe Fum-Vit C-Vit B12-FA  1 capsule Oral QPC breakfast   fluticasone furoate-vilanterol  1 puff Inhalation Daily   And   umeclidinium bromide  1 puff Inhalation Daily   gabapentin  600 mg Oral QHS   latanoprost  1 drop Both Eyes QHS   melatonin  3 mg Oral QHS   midodrine  10 mg Oral TID   pantoprazole  40 mg Oral Daily   tolvaptan  15 mg Oral Once    Assessment: 77 yo male with acute chronic systolic HF on warfarin PTA. Oral anticoagulation to be on hold for cardiac cath. Pharmacy consulted to dose heparin.   INR previously 1.5 (on hold PTA for planned heart cath). Home warfarin regimen: 3mg /day (last dose taken 8/3)  Heparin level is therapeutic at 0.51, on 1250 units/hr. Hgb 10.8, plt 107. No s/sx of bleeding or infusion issues.   Goal of Therapy:  Heparin level 0.3-0.7 units/ml Monitor platelets by anticoagulation protocol: Yes   Plan:  -Continue Heparin drip 1250 units/hr  -Heparin level daily with CBC daily  Thank you for allowing pharmacy to participate in this patient's care,  Sherron Monday, PharmD, BCCCP Clinical Pharmacist  Phone: [phone] 01/19/2023 9:47 AM  Please check AMION for all [facility] Pharmacy phone  numbers After 10:00 PM, call Main Pharmacy [phone]

## 2023-01-19 NOTE — Progress Notes (Signed)
Mobility Specialist Progress Note:   01/19/23 1000  Mobility  Activity Ambulated with assistance in hallway  Level of Assistance Contact guard assist, steadying assist  Assistive Device Front wheel walker  Distance Ambulated (ft) 140 ft  Activity Response Tolerated well  Mobility Referral Yes  $Mobility charge 1 Mobility  Mobility Specialist Start Time (ACUTE ONLY) 0935  Mobility Specialist Stop Time (ACUTE ONLY) 0955  Mobility Specialist Time Calculation (min) (ACUTE ONLY) 20 min    Pre Mobility: 59 HR,  97% SpO2 During Mobility: 59 HR,  92% SpO2 Post Mobility:  55 HR,  97% SpO2  Pt received in bed, agreeable to mobility. MinA for STS. Asymptomatic throughout session. Pt left in bed with call bel and bed alarm on.  D'Vante Earlene Plater Mobility Specialist Please contact via Special educational needs teacher or Rehab office at [phone]

## 2023-01-19 NOTE — Care Management Important Message (Signed)
Important Message  Patient Details  Name: Zaelyn Youngs MRN: [id number] Date of Birth: Nov 25, 1945   Medicare Important Message Given:  Yes      Stefan Church 01/19/2023, 2:57 PM

## 2023-01-19 NOTE — H&P (View-Only) (Signed)
Advanced Heart Failure Rounding Note   Subjective:    Admitted with volume overload and AKI.  01/18/23 Started on DBA 2.5 mcg. Diuretics held.    SCr 3.2 -> 2.6 -> 2.4-> 2.97 Sodium 120   Frustrated about his weight going up. Denies SOB.   Objective:   Weight Range:  Vital Signs:   Temp:  [97.8 F (36.6 C)-98.5 F (36.9 C)] 97.8 F (36.6 C) (08/12 0341) Pulse Rate:  [52-61] 60 (08/12 0341) Resp:  [14-17] 16 (08/12 0341) BP: (83-99)/(46-78) 93/78 (08/12 0341) SpO2:  [94 %-98 %] 94 % (08/12 0341) Weight:  [90.1 kg] 90.1 kg (08/12 0341) Last BM Date : 01/19/2023  Weight change: Filed Weights   01/17/23 0134 01/18/23 0338 01/19/23 0341  Weight: 88.1 kg 90.6 kg 90.1 kg    Intake/Output:   Intake/Output Summary (Last 24 hours) at 01/19/2023 0710 Last data filed at 01/19/2023 0981 Gross per 24 hour  Intake 628.62 ml  Output 2750 ml  Net -2121.38 ml     Physical Exam: General:  Elderly No resp difficulty HEENT: normal Neck: supple. JVP difficult to assess. Carotids 2+ bilat; no bruits. No lymphadenopathy or thryomegaly appreciated. Cor: PMI nondisplaced. Regular rate & rhythm. No rubs, gallops. RUSB 2//6 . Lungs: clear Abdomen: soft, nontender, nondistended. No hepatosplenomegaly. No bruits or masses. Good bowel sounds. Extremities: no cyanosis, clubbing, rash, R and LLE unna boots. LUE ecchymotic with skin tears/bleeding.  Neuro: alert & orientedx3, cranial nerves grossly intact. moves all 4 extremities w/o difficulty. Affect pleasant   Telemetry: SR 50-60s   Labs: Basic Metabolic Panel: Recent Labs  Lab 01/14/23 1133 01/16/23 0707 01/17/23 0018 01/18/23 0031 01/19/23 0028  NA 127* 125* 125* 124* 120*  K 5.5* 4.7 3.7 3.6 4.0  CL 86* 87* 91* 86* 86*  CO2 25 24 20* 22 21*  GLUCOSE 85 96 112* 77 113*  BUN 60* 74* 69* 65* 65*  CREATININE 2.37* 3.20* 2.59* 2.47* 2.97*  CALCIUM 9.1 8.7* 8.5* 8.3* 8.1*  MG  --   --   --   --  2.5*    Liver Function  Tests: Recent Labs  Lab 01/12/23 1032  AST 41*  ALT 30  ALKPHOS 147*  BILITOT 1.2  PROT 7.0  ALBUMIN 3.6*   No results for input(s): "LIPASE", "AMYLASE" in the last 168 hours. No results for input(s): "AMMONIA" in the last 168 hours.  CBC: Recent Labs  Lab 01/12/23 1032 01/14/23 1133 01/16/23 0940 01/17/23 0018 01/18/23 0031 01/19/23 0028  WBC 5.7 5.5 5.0 5.6 5.6 5.5  NEUTROABS 4.2  --   --   --   --   --   HGB 12.3* 12.3* 11.9* 11.7* 11.4* 10.8*  HCT 36.0* 36.6* 34.2* 33.8* 33.1* 30.3*  MCV 91 90 87.5 88.5 88.5 86.3  PLT 78* 91* 124* 128* 98* 107*    Cardiac Enzymes: No results for input(s): "CKTOTAL", "CKMB", "CKMBINDEX", "TROPONINI" in the last 168 hours.  BNP: BNP (last 3 results) Recent Labs    01/01/23 1430 01/03/23 0910 01/07/23 1923  BNP 1,924.8* 1,458.7* 2,208.6*    ProBNP (last 3 results) Recent Labs    01/12/23 1036  PROBNP 9,272*      Other results:  Imaging: No results found.   Medications:     Scheduled Medications:  amiodarone  100 mg Oral Daily   aspirin EC  81 mg Oral Daily   atorvastatin  80 mg Oral Daily   escitalopram  10 mg Oral Daily  ezetimibe  5 mg Oral Daily   Fe Fum-Vit C-Vit B12-FA  1 capsule Oral QPC breakfast   fluticasone furoate-vilanterol  1 puff Inhalation Daily   And   umeclidinium bromide  1 puff Inhalation Daily   gabapentin  600 mg Oral QHS   latanoprost  1 drop Both Eyes QHS   melatonin  3 mg Oral QHS   midodrine  10 mg Oral TID   pantoprazole  40 mg Oral Daily    Infusions:  DOBUTamine 2.5 mcg/kg/min (01/18/23 1558)   heparin 1,250 Units/hr (01/19/23 0112)    PRN Medications: acetaminophen, albuterol, nitroGLYCERIN, ondansetron (ZOFRAN) IV   Assessment/Plan:   Acute on chronic systolic heart failure - Echo 22/2/97 showed EF 20-25%, severe LV dysfunction with global HK, + large Left pleural effusion, RV moderately down, moderate aortic stenosis, suspect component of low gradient AS. -  RHC (10/23): Elevated filling pressures with moderately decreased cardiac output. RA 8, PA 48/16, PCWP 22, CO/CI (Fick) 5.7/2.7. - Echo 7/24: EF 20-25%, RV mildly reduced, RA/LA severely dilated, trivial MR, no AV regurgitation - Felt not to be a suitable VAD candidate (Drs. Gala Romney & Bartle discussed). Patient declined VAD as well. - NYHA IV. Volume overloaded on admission. -  MAPs remain borderline in setting of AS despite midodrine and DBA - Continue DBA 2.5. Yesterday diuretics held. Worsening renal function today.  Will need RHC to assess hemodynamics via RIJ swan then will switch to central line - Continue to hold diuretics. I dont think he is volume overloaded.  - GDMT on hold with AKI and hypotension - Continue midodrine 10 mg tid. - Continue Unna boots - He has end-stage HF and severe AS now with volume overload and AKI. Admitted for diuresis and possible inotropic support. Worried that the main issue may be his cardiomyopathy and that TAVR might not be enough to improve his condition. Continue aggressive care for now -We discussed possible need for Palliative Care. Discussed again he is likely end stage.    Severe aortic stenosis, LVLG AS - Moderate on echo 10/23  - ?? Seen on 7/24 echo - Admitted for TAVR eval, needs volume optimization first.  - LHC heart cath pending renal function improvement. RHC today as noted above.  - See discussion above   AKI on CKD stage 4 - SCr 3.2, baseline 1.8-2.1 - Scr trending back up 3.2 -> 2.6 -> 2.4- >3 - Avoid hypotension    CAD s/p late presentation inferior STEMI - LHC 05/23: 100% m RCA and 90% m LAD.  - RCA managed medically d/t completed infarct.  - Echo (5/23) EF 25%, RV moderately reduced, moderate to severe MR - Echo (10/23): EF 20-25%, RV moderately down. - s/p CABG x 2 (LIMA-LAD, SVG-Ramus)  (11/01/21) - No s/s angina - Continue ASA + high intensity statin   Mitral regurgitation - Moderate to severe ischemic MR on TEE.  -  S/p bioprosthetic MVR 11/02/21. - Warfarin on hold, start heparin drip   Hyponatremia - Na 120 today. Give 15 mg tolvaptan.  -Limit free water.    H/O pericardial effusion/pericarditis - Moderate in size on past echo, w/o tamponade. - Acute pericarditis/ adhensions noted at time of CABG. - Resolved on echo 10/23 and not seen on echo 7/24   PAF - s/p MAZE w/ CABG  - Continue amiodarone 100 mg daily.  - Remains in NSR. Heparin drip until procedures complete   Set up RHC via RIJ/Swan then can leave central line for access. NPO  The patient understands that risks included but are not limited to stroke (1 in 1000), death (1 in 1000), kidney failure [usually temporary] (1 in 500), bleeding (1 in 200), allergic reaction [possibly serious] (1 in 200).  The patient understands and agrees to proceed.    Length of Stay: 3   Amy Clegg NP-C  01/19/2023, 7:10 AM  Advanced Heart Failure Team Pager [phone] (M-F; 7a - 4p)  Please contact [facility] for night-coverage after hours (4p -7a ) and weekends on [email or web address]   Patient seen and examined with the above-signed Advanced Practice Provider and/or Housestaff. I personally reviewed laboratory data, imaging studies and relevant notes. I independently examined the patient and formulated the important aspects of the plan. I have edited the note to reflect any of my changes or salient points. I have personally discussed the plan with the patient and/or family.  DBA started yesterday. Remains on IV lasix. SBP remains in 90s. Sodium down. Scr up.   Feels weak. No CP or SOB.   General:  Weak appearing. No resp difficulty HEENT: normal Neck: supple. JVP 8-10. Carotids 2+ bilat; no bruits. No lymphadenopathy or thryomegaly appreciated. Cor: Distant HS soft AS Lungs: clear Abdomen: soft, nontender, nondistended. No hepatosplenomegaly. No bruits or masses. Good bowel sounds. Extremities: no cyanosis, clubbing, rash, tr edema + UNNA Neuro: alert &  orientedx3, cranial nerves grossly intact. moves all 4 extremities w/o difficulty. Affect pleasant  Continues to decline despite inotropic support. Will plan RHC today. Hold lasix. Give tolvaptan.   May need to get Palliative Care involved.   Arvilla Meres, MD  8:49 AM

## 2023-01-19 NOTE — Progress Notes (Addendum)
Advanced Heart Failure Rounding Note   Subjective:    Admitted with volume overload and AKI.  01/18/23 Started on DBA 2.5 mcg. Diuretics held.    SCr 3.2 -> 2.6 -> 2.4-> 2.97 Sodium 120   Frustrated about his weight going up. Denies SOB.   Objective:   Weight Range:  Vital Signs:   Temp:  [97.8 F (36.6 C)-98.5 F (36.9 C)] 97.8 F (36.6 C) (08/12 0341) Pulse Rate:  [52-61] 60 (08/12 0341) Resp:  [14-17] 16 (08/12 0341) BP: (83-99)/(46-78) 93/78 (08/12 0341) SpO2:  [94 %-98 %] 94 % (08/12 0341) Weight:  [90.1 kg] 90.1 kg (08/12 0341) Last BM Date : 01/25/2023  Weight change: Filed Weights   01/17/23 0134 01/18/23 0338 01/19/23 0341  Weight: 88.1 kg 90.6 kg 90.1 kg    Intake/Output:   Intake/Output Summary (Last 24 hours) at 01/19/2023 0710 Last data filed at 01/19/2023 0981 Gross per 24 hour  Intake 628.62 ml  Output 2750 ml  Net -2121.38 ml     Physical Exam: General:  Elderly No resp difficulty HEENT: normal Neck: supple. JVP difficult to assess. Carotids 2+ bilat; no bruits. No lymphadenopathy or thryomegaly appreciated. Cor: PMI nondisplaced. Regular rate & rhythm. No rubs, gallops. RUSB 2//6 . Lungs: clear Abdomen: soft, nontender, nondistended. No hepatosplenomegaly. No bruits or masses. Good bowel sounds. Extremities: no cyanosis, clubbing, rash, R and LLE unna boots. LUE ecchymotic with skin tears/bleeding.  Neuro: alert & orientedx3, cranial nerves grossly intact. moves all 4 extremities w/o difficulty. Affect pleasant   Telemetry: SR 50-60s   Labs: Basic Metabolic Panel: Recent Labs  Lab 01/14/23 1133 01/16/23 0707 01/17/23 0018 01/18/23 0031 01/19/23 0028  NA 127* 125* 125* 124* 120*  K 5.5* 4.7 3.7 3.6 4.0  CL 86* 87* 91* 86* 86*  CO2 25 24 20* 22 21*  GLUCOSE 85 96 112* 77 113*  BUN 60* 74* 69* 65* 65*  CREATININE 2.37* 3.20* 2.59* 2.47* 2.97*  CALCIUM 9.1 8.7* 8.5* 8.3* 8.1*  MG  --   --   --   --  2.5*    Liver Function  Tests: Recent Labs  Lab 01/12/23 1032  AST 41*  ALT 30  ALKPHOS 147*  BILITOT 1.2  PROT 7.0  ALBUMIN 3.6*   No results for input(s): "LIPASE", "AMYLASE" in the last 168 hours. No results for input(s): "AMMONIA" in the last 168 hours.  CBC: Recent Labs  Lab 01/12/23 1032 01/14/23 1133 01/16/23 0940 01/17/23 0018 01/18/23 0031 01/19/23 0028  WBC 5.7 5.5 5.0 5.6 5.6 5.5  NEUTROABS 4.2  --   --   --   --   --   HGB 12.3* 12.3* 11.9* 11.7* 11.4* 10.8*  HCT 36.0* 36.6* 34.2* 33.8* 33.1* 30.3*  MCV 91 90 87.5 88.5 88.5 86.3  PLT 78* 91* 124* 128* 98* 107*    Cardiac Enzymes: No results for input(s): "CKTOTAL", "CKMB", "CKMBINDEX", "TROPONINI" in the last 168 hours.  BNP: BNP (last 3 results) Recent Labs    01/01/23 1430 01/03/23 0910 01/07/23 1923  BNP 1,924.8* 1,458.7* 2,208.6*    ProBNP (last 3 results) Recent Labs    01/12/23 1036  PROBNP 9,272*      Other results:  Imaging: No results found.   Medications:     Scheduled Medications:  amiodarone  100 mg Oral Daily   aspirin EC  81 mg Oral Daily   atorvastatin  80 mg Oral Daily   escitalopram  10 mg Oral Daily  ezetimibe  5 mg Oral Daily   Fe Fum-Vit C-Vit B12-FA  1 capsule Oral QPC breakfast   fluticasone furoate-vilanterol  1 puff Inhalation Daily   And   umeclidinium bromide  1 puff Inhalation Daily   gabapentin  600 mg Oral QHS   latanoprost  1 drop Both Eyes QHS   melatonin  3 mg Oral QHS   midodrine  10 mg Oral TID   pantoprazole  40 mg Oral Daily    Infusions:  DOBUTamine 2.5 mcg/kg/min (01/18/23 1558)   heparin 1,250 Units/hr (01/19/23 0112)    PRN Medications: acetaminophen, albuterol, nitroGLYCERIN, ondansetron (ZOFRAN) IV   Assessment/Plan:   Acute on chronic systolic heart failure - Echo 22/2/97 showed EF 20-25%, severe LV dysfunction with global HK, + large Left pleural effusion, RV moderately down, moderate aortic stenosis, suspect component of low gradient AS. -  RHC (10/23): Elevated filling pressures with moderately decreased cardiac output. RA 8, PA 48/16, PCWP 22, CO/CI (Fick) 5.7/2.7. - Echo 7/24: EF 20-25%, RV mildly reduced, RA/LA severely dilated, trivial MR, no AV regurgitation - Felt not to be a suitable VAD candidate (Drs. Gala Romney & Bartle discussed). Patient declined VAD as well. - NYHA IV. Volume overloaded on admission. -  MAPs remain borderline in setting of AS despite midodrine and DBA - Continue DBA 2.5. Yesterday diuretics held. Worsening renal function today.  Will need RHC to assess hemodynamics via RIJ swan then will switch to central line - Continue to hold diuretics. I dont think he is volume overloaded.  - GDMT on hold with AKI and hypotension - Continue midodrine 10 mg tid. - Continue Unna boots - He has end-stage HF and severe AS now with volume overload and AKI. Admitted for diuresis and possible inotropic support. Worried that the main issue may be his cardiomyopathy and that TAVR might not be enough to improve his condition. Continue aggressive care for now -We discussed possible need for Palliative Care. Discussed again he is likely end stage.    Severe aortic stenosis, LVLG AS - Moderate on echo 10/23  - ?? Seen on 7/24 echo - Admitted for TAVR eval, needs volume optimization first.  - LHC heart cath pending renal function improvement. RHC today as noted above.  - See discussion above   AKI on CKD stage 4 - SCr 3.2, baseline 1.8-2.1 - Scr trending back up 3.2 -> 2.6 -> 2.4- >3 - Avoid hypotension    CAD s/p late presentation inferior STEMI - LHC 05/23: 100% m RCA and 90% m LAD.  - RCA managed medically d/t completed infarct.  - Echo (5/23) EF 25%, RV moderately reduced, moderate to severe MR - Echo (10/23): EF 20-25%, RV moderately down. - s/p CABG x 2 (LIMA-LAD, SVG-Ramus)  (11/01/21) - No s/s angina - Continue ASA + high intensity statin   Mitral regurgitation - Moderate to severe ischemic MR on TEE.  -  S/p bioprosthetic MVR 11/02/21. - Warfarin on hold, start heparin drip   Hyponatremia - Na 120 today. Give 15 mg tolvaptan.  -Limit free water.    H/O pericardial effusion/pericarditis - Moderate in size on past echo, w/o tamponade. - Acute pericarditis/ adhensions noted at time of CABG. - Resolved on echo 10/23 and not seen on echo 7/24   PAF - s/p MAZE w/ CABG  - Continue amiodarone 100 mg daily.  - Remains in NSR. Heparin drip until procedures complete   Set up RHC via RIJ/Swan then can leave central line for access. NPO  The patient understands that risks included but are not limited to stroke (1 in 1000), death (1 in 1000), kidney failure [usually temporary] (1 in 500), bleeding (1 in 200), allergic reaction [possibly serious] (1 in 200).  The patient understands and agrees to proceed.    Length of Stay: 3   Amy Clegg NP-C  01/19/2023, 7:10 AM  Advanced Heart Failure Team Pager [phone] (M-F; 7a - 4p)  Please contact [facility] for night-coverage after hours (4p -7a ) and weekends on [email or web address]   Patient seen and examined with the above-signed Advanced Practice Provider and/or Housestaff. I personally reviewed laboratory data, imaging studies and relevant notes. I independently examined the patient and formulated the important aspects of the plan. I have edited the note to reflect any of my changes or salient points. I have personally discussed the plan with the patient and/or family.  DBA started yesterday. Remains on IV lasix. SBP remains in 90s. Sodium down. Scr up.   Feels weak. No CP or SOB.   General:  Weak appearing. No resp difficulty HEENT: normal Neck: supple. JVP 8-10. Carotids 2+ bilat; no bruits. No lymphadenopathy or thryomegaly appreciated. Cor: Distant HS soft AS Lungs: clear Abdomen: soft, nontender, nondistended. No hepatosplenomegaly. No bruits or masses. Good bowel sounds. Extremities: no cyanosis, clubbing, rash, tr edema + UNNA Neuro: alert &  orientedx3, cranial nerves grossly intact. moves all 4 extremities w/o difficulty. Affect pleasant  Continues to decline despite inotropic support. Will plan RHC today. Hold lasix. Give tolvaptan.   May need to get Palliative Care involved.   Arvilla Meres, MD  8:49 AM

## 2023-01-19 NOTE — Progress Notes (Signed)
Patient is alert and oriented x4. Upon shift change, patient began to bleed from R-arm. Has very thin skin and he constantly scratches which makes skin break and bleed. Educated patient about scratching but he continues to scratch with emphasis to the R-arm. He has had several bed and bandage changes due to bleeding to his right arm caused by him scratching. Patient denied pain. Bps have improved maintaining a MAP above 65.

## 2023-01-19 NOTE — Interval H&P Note (Signed)
History and Physical Interval Note:  01/19/2023 11:12 AM  Keith Jenkins  has presented today for surgery, with the diagnosis of heart failure.  The various methods of treatment have been discussed with the patient and family. After consideration of risks, benefits and other options for treatment, the patient has consented to  Procedure(s): RIGHT HEART CATH (N/A) as a surgical intervention.  The patient's history has been reviewed, patient examined, no change in status, stable for surgery.  I have reviewed the patient's chart and labs.  Questions were answered to the patient's satisfaction.      

## 2023-01-19 NOTE — Discharge Summary (Signed)
Advanced Heart Failure Team  Discharge Summary   Patient ID: Keith Jenkins MRN: [id number], DOB/AGE: [birth year] 77 y.o. Admit date: 01/16/2023 D/C date:     01/24/2023   Primary Discharge Diagnoses:  Acute on Chronic End-Stage Systolic Heart Failure, Low Output  Severe Aortic Stenosis  AKI on CKD IV Hyponatremia  CAD s/p CABG Mitral Regurgitation, s/p bioprosthetic MVR     Hospital Course:  Vada Linhardt is a 77 y.o. male with history of CAD s/p CABG, MR s/p MVR at time of CABG in 05/23, HFrEF, COPD, prediabetes, GERD/dysphagia, HLD, CKD.   Admitted 4/23 with late presenting inferior STEMI. LHC showed total occlusion mRCA and 90% mLAD. RCA managed medically d/t completed infarct.  Echo showed EF 20-25%, RWMA, RV moderately reduced, moderate to severe ischemic MR. CVTS consulted for CABG/MVR, instead of PCI to LAD, due to moderate to severe ischemic MR.    Admitted 5/23 with a/c CHF. Echo EF 30-35%, RV moderately reduced. Started on milrinone. Developed AFL and amiodarone gtt started. Underwent CABG x 2 (LIMA-LAD, SVG-RAMUS INT) +MVR, MAZE, LAA Clip. Hospitalization c/b distal esophageal stricture seen on barium swallow. GI consulted and plan for outpatient EGD/dilation.  Volume overloaded and NYHA IV at follow up 03/12/22. Direct admit from clinic for IV diuresis and VAD work up. RHC showedelevated filling pressures with moderately decreased cardiac output, Fick CO/CI 5.7/2.7 Thermo CO/CI 4.7/2.2. CVTS and Dr. Gala Romney reviewed case and felt patient not a good candidate for VAD. Patient also declined VAD. Underwent bilateral thoracentesis before discharge.    Seen in AHF 12/17/22 and he was volume overloaded. Required multiple doses of Furoscix. Since then he was admitted for a fall 2/2 weakness 01/01/23. Was also diuresed during this admission. Presented to the ED again 7/31 with increased SOB. Volume overloaded 2/2 dietary indiscretion. Felt better after IV lasix and discharged home.    Presented for [facility] as part of TAVR workup. Labs revealed worsening renal function (Scr 3.2, baseline 1.8-2.1) and severe volume overload. Cath aborted and he was admitted under Advanced Heart Failure service for IV diuresis and possible inotrope support.  He diuresed with IV lasix with some initial improvement in renal function. His MAPs remained borderline despite midodrine. He was started on dobutamine. Despite inotrope support, he continued to struggle w/ persistent volume overload, diuretic resistance, progressive renal failure and hyponatremia. RHC on DBA c/w biventricular HF with moderately depressed CO and elevated filling pressures. Felt to be end-stage HF. Given severity of renal failure and overall debility, the risk of TAVR outweighs any potential benefit. Also not a candidate for dialysis. Palliative care was consulted to assist w/ EOL GOC discussion. Patient and family decided to transition to hospice care. Made DNR/DNI. Home hospice services were arranged.   Per Dr. Prescott Gum Recommendations-  "On d/c would only use Torsemide 80 bid with K 20 bid, Midodrine 10 tid, ASA 81. Would stop all other cardiac meds including warfarin, amio, atorvastatin, spiro, zetia and farxiga"    Palliative care recommended discharging with lorazepam 1 mg tablet q4hrs as needed for anxiety and diluadid 2-4mg  q2hrs as needed for pain, sob.   Discharge orders reflect these recommendations   Discharge Weight Range: 198-203 lbs  Discharge Vitals: Blood pressure (!) 73/49, pulse 68, temperature 98.2 F (36.8 C), temperature source Oral, resp. rate 16, height 6\' 1"  (1.854 m), weight 92.2 kg, SpO2 97%.  Labs: Lab Results  Component Value Date   WBC 7.3 01/24/2023   HGB 8.5 (L) 01/24/2023   HCT 24.0 (L)  01/24/2023   MCV 88.2 01/24/2023   PLT 96 (L) 01/24/2023    Recent Labs  Lab 01/23/23 0400 01/23/23 1735 01/24/23 0528  NA 118*  120*   < > 120*  K 4.5  4.4  --  4.2  CL 88*  90*  --  89*   CO2 19*  19*  --  21*  BUN 79*  80*  --  87*  CREATININE 2.20*  2.43*  --  2.58*  CALCIUM 7.9*  8.1*  --  8.2*  PROT 6.3*  --   --   BILITOT 2.1*  --   --   ALKPHOS 75  --   --   ALT 25  --   --   AST 38  --   --   GLUCOSE 102*  104*  --  106*   < > = values in this interval not displayed.   Lab Results  Component Value Date   CHOL 75 03/18/2022   HDL 33 (L) 03/18/2022   LDLCALC 29 03/18/2022   TRIG 63 03/18/2022   BNP (last 3 results) Recent Labs    01/01/23 1430 01/03/23 0910 01/07/23 1923  BNP 1,924.8* 1,458.7* 2,208.6*    ProBNP (last 3 results) Recent Labs    01/12/23 1036  PROBNP 9,272*     Diagnostic Studies/Procedures   No results found.  Discharge Medications   Allergies as of 01/24/2023   No Known Allergies      Medication List     STOP taking these medications    amiodarone 100 MG tablet Commonly known as: PACERONE   atorvastatin 80 MG tablet Commonly known as: LIPITOR   cyanocobalamin 1000 MCG/ML injection Commonly known as: VITAMIN B12   dapagliflozin propanediol 10 MG Tabs tablet Commonly known as: FARXIGA   esomeprazole 40 MG capsule Commonly known as: NEXIUM   ezetimibe 10 MG tablet Commonly known as: ZETIA   FUROSCIX [place]   gabapentin 600 MG tablet Commonly known as: Neurontin   Iron (Ferrous Sulfate) 325 (65 Fe) MG Tabs   metolazone 2.5 MG tablet Commonly known as: ZAROXOLYN   nitroGLYCERIN 0.4 MG SL tablet Commonly known as: NITROSTAT   spironolactone 25 MG tablet Commonly known as: ALDACTONE   warfarin 1 MG tablet Commonly known as: COUMADIN       TAKE these medications    acetaminophen 500 MG tablet Commonly known as: TYLENOL Take 250 mg by mouth every 6 (six) hours as needed for moderate pain.   albuterol (2.5 MG/3ML) 0.083% nebulizer solution Commonly known as: PROVENTIL Inhale 3 mLs into the lungs every 6 (six) hours as needed for wheezing or shortness of breath.   albuterol 108 (90 Base)  MCG/ACT inhaler Commonly known as: VENTOLIN HFA Inhale 2 puffs into the lungs every 6 (six) hours as needed for wheezing or shortness of breath.   Aspirin Low Dose 81 MG tablet Generic drug: aspirin EC TAKE ONE TABLET BY MOUTH ONCE DAILY   escitalopram 10 MG tablet Commonly known as: LEXAPRO Take 1 tablet (10 mg total) by mouth daily.   HYDROmorphone 2 MG tablet Commonly known as: Dilaudid Take 1 tablet (2 mg total) by mouth every 2 (two) hours as needed for severe pain or moderate pain (shortness of breath).   latanoprost 0.005 % ophthalmic solution Commonly known as: XALATAN Place 1 drop into both eyes at bedtime.   LORazepam 1 MG tablet Commonly known as: ATIVAN Take 1 tablet (1 mg total) by mouth every 4 (four) hours  as needed for anxiety.   midodrine 10 MG tablet Commonly known as: PROAMATINE Take 1 tablet (10 mg total) by mouth 3 (three) times daily.   OVER THE COUNTER MEDICATION Apply 1 application  topically at bedtime. Medication: Mag Maxx Cream Back and restless leg   potassium chloride SA 20 MEQ tablet Commonly known as: KLOR-CON M Take 1 tablet (20 mEq total) by mouth 2 (two) times daily. What changed:  how much to take when to take this   torsemide 20 MG tablet Commonly known as: DEMADEX Take 4 tablets (80 mg total) by mouth 2 (two) times daily. What changed: when to take this   Trelegy Ellipta 100-62.5-25 MCG/ACT Aepb Generic drug: Fluticasone-Umeclidin-Vilant Inhale 1 puff into the lungs daily. What changed:  when to take this reasons to take this        Disposition   The patient will be discharged in stable condition to home.   Follow-up Information     [facility] Follow up.   Why: Home Hospice RN will call to set up home visit Contact information: [address] [facility] [phone]                  Duration of Discharge Encounter: Greater than 35 minutes    Signed, Jonita Albee PA-C  01/24/2023, 9:51 AM

## 2023-01-20 DIAGNOSIS — I5023 Acute on chronic systolic (congestive) heart failure: Secondary | ICD-10-CM | POA: Diagnosis not present

## 2023-01-20 LAB — COOXEMETRY PANEL
Carboxyhemoglobin: 1.6 % — ABNORMAL HIGH (ref 0.5–1.5)
Carboxyhemoglobin: 1.5 % (ref 0.5–1.5)
Methemoglobin: <0.7 % (ref 0.0–1.5)
Methemoglobin: <0.7 % (ref 0.0–1.5)
O2 Saturation: 49 %
O2 Saturation: 61.9 %
Total hemoglobin: 10.7 g/dL — ABNORMAL LOW (ref 12.0–16.0)
Total hemoglobin: 8.9 g/dL — ABNORMAL LOW (ref 12.0–16.0)

## 2023-01-20 LAB — SODIUM: Sodium: 121 mmol/L — ABNORMAL LOW (ref 135–145)

## 2023-01-20 LAB — HEPARIN LEVEL (UNFRACTIONATED): Heparin Unfractionated: 0.15 IU/mL — ABNORMAL LOW (ref 0.30–0.70)

## 2023-01-20 MED ORDER — TOLVAPTAN 15 MG PO TABS
15.0000 mg | ORAL_TABLET | Freq: Once | ORAL | Status: AC
  Administered 2023-01-20: 15 mg via ORAL
  Filled 2023-01-20: qty 1

## 2023-01-20 MED ORDER — HEPARIN (PORCINE) 25000 UT/250ML-% IV SOLN
1100.0000 [IU]/h | INTRAVENOUS | Status: DC
  Administered 2023-01-20: 1250 [IU]/h via INTRAVENOUS
  Administered 2023-01-21: 1100 [IU]/h via INTRAVENOUS
  Administered 2023-01-22: 1200 [IU]/h via INTRAVENOUS
  Administered 2023-01-23: 1100 [IU]/h via INTRAVENOUS
  Filled 2023-01-20 (×4): qty 250

## 2023-01-20 NOTE — Plan of Care (Signed)
  Problem: Cardiovascular: Goal: Vascular access site(s) Level 0-1 will be maintained Outcome: Progressing   Problem: Health Behavior/Discharge Planning: Goal: Ability to safely manage health-related needs after discharge will improve Outcome: Progressing   Problem: Education: Goal: Knowledge of General Education information will improve Description: Including pain rating scale, medication(s)/side effects and non-pharmacologic comfort measures Outcome: Progressing   Problem: Health Behavior/Discharge Planning: Goal: Ability to manage health-related needs will improve Outcome: Progressing   Problem: Clinical Measurements: Goal: Will remain free from infection Outcome: Progressing Goal: Diagnostic test results will improve Outcome: Progressing Goal: Respiratory complications will improve Outcome: Progressing   Problem: Activity: Goal: Risk for activity intolerance will decrease Outcome: Progressing

## 2023-01-20 NOTE — Progress Notes (Signed)
Holding patients Heparin gtt for six hours due to bleeding per Dr. Gala Romney

## 2023-01-20 NOTE — Progress Notes (Addendum)
ANTICOAGULATION CONSULT NOTE - Follow Up Consult  Pharmacy Consult for heparin Indication: atrial fibrillation  No Known Allergies  Patient Measurements: Height: 6\' 1"  (185.4 cm) Weight: 88.8 kg (195 lb 12.8 oz) IBW/kg (Calculated) : 79.9 HEPARIN DW (KG): 84.8   Vital Signs: Temp: 97.9 F (36.6 C) (08/13 0814) Temp Source: Axillary (08/13 0814) BP: 106/59 (08/13 0814) Pulse Rate: 58 (08/13 0814)  Labs: Recent Labs    01/18/23 0031 01/19/23 0028 01/19/23 0815 01/19/23 1443 01/19/23 1444 01/20/23 0529  HGB 11.4* 10.8*  --  11.2* 11.6* 10.1*  HCT 33.1* 30.3*  --  33.0* 34.0* 28.6*  PLT 98* 107*  --   --   --  108*  HEPARINUNFRC 0.44 0.51  --   --   --   --   CREATININE 2.47* 2.97* 2.77*  --   --  2.29*    Estimated Creatinine Clearance: 31.5 mL/min (A) (by C-G formula based on SCr of 2.29 mg/dL (H)).   Medical History: Past Medical History:  Diagnosis Date   Aortic atherosclerosis (HCC)    Arthritis    CHF (congestive heart failure) (HCC)    Esophageal stricture    Glaucoma    MI (myocardial infarction) (HCC)    Non-ST elevation (NSTEMI) myocardial infarction (HCC) 10/10/2021   NSTEMI (non-ST elevated myocardial infarction) (HCC) 10/10/2021   Osteoarthritis    Severe mitral regurgitation    Stricture and stenosis of esophagus     Medications:  Medications Prior to Admission  Medication Sig Dispense Refill Last Dose   acetaminophen (TYLENOL) 500 MG tablet Take 250 mg by mouth every 6 (six) hours as needed for moderate pain.   01/15/2023   albuterol (PROVENTIL) (2.5 MG/3ML) 0.083% nebulizer solution Inhale 3 mLs into the lungs every 6 (six) hours as needed for wheezing or shortness of breath. 90 mL 12 01/15/2023   albuterol (VENTOLIN HFA) 108 (90 Base) MCG/ACT inhaler Inhale 2 puffs into the lungs every 6 (six) hours as needed for wheezing or shortness of breath. 8 g 0 01/15/2023   amiodarone (PACERONE) 100 MG tablet TAKE ONE TABLET BY MOUTH ONCE DAILY 90 tablet 3  01/16/2023 at 0500   aspirin EC (ASPIRIN LOW DOSE) 81 MG tablet TAKE ONE TABLET BY MOUTH ONCE DAILY 90 tablet 3 01/16/2023 at 0500   atorvastatin (LIPITOR) 80 MG tablet TAKE ONE TABLET BY MOUTH EVERYDAY AT BEDTIME (Patient taking differently: Take 80 mg by mouth daily.) 90 tablet 3 01/16/2023 at 0500   cyanocobalamin (VITAMIN B12) 1000 MCG/ML injection Inject 1 mL (1,000 mcg total) into the muscle every 30 (thirty) days. 1 mL 3 Past Month   dapagliflozin propanediol (FARXIGA) 10 MG TABS tablet Take 1 tablet (10 mg total) by mouth daily. 90 tablet 1 01/15/2023   esomeprazole (NEXIUM) 40 MG capsule Take 40 mg by mouth daily.   01/15/2023   ezetimibe (ZETIA) 10 MG tablet Take 0.5 tablets (5 mg total) by mouth daily. 90 tablet 1 01/15/2023   Furosemide (FUROSCIX [place]) Inject into the skin.   01/13/2023   Iron, Ferrous Sulfate, 325 (65 Fe) MG TABS Take 325 mg by mouth daily. 30 tablet 1 01/15/2023   latanoprost (XALATAN) 0.005 % ophthalmic solution Place 1 drop into both eyes at bedtime.   01/15/2023   metolazone (ZAROXOLYN) 2.5 MG tablet Take 1 tablet (2.5 mg total) by mouth daily as needed. 30 tablet 0 Past Month   midodrine (PROAMATINE) 10 MG tablet Take 1 tablet (10 mg total) by mouth 3 (three) times  daily. 270 tablet 3 01/16/2023 at 0500   OVER THE COUNTER MEDICATION Apply 1 application  topically at bedtime. Medication: Mag Maxx Cream Back and restless leg   Past Week   potassium chloride SA (KLOR-CON M) 20 MEQ tablet TAKE TWO TABLETS BY MOUTH ONCE DAILY 60 tablet 6 01/15/2023 at 0800   spironolactone (ALDACTONE) 25 MG tablet TAKE ONE TABLET BY MOUTH ONCE DAILY 90 tablet 3 01/15/2023   torsemide (DEMADEX) 20 MG tablet Take 4 tablets (80 mg total) by mouth daily. (Patient taking differently: Take 40-80 mg by mouth See admin instructions. Takes 80 mg in the morning and 40 mg at night for 3 days.) 120 tablet 5 01/15/2023   Fluticasone-Umeclidin-Vilant (TRELEGY ELLIPTA) 100-62.5-25 MCG/ACT AEPB Inhale 1 puff into the lungs daily.  (Patient taking differently: Inhale 1 puff into the lungs daily as needed (wheezing).) 1 each 11 More than a month   nitroGLYCERIN (NITROSTAT) 0.4 MG SL tablet Place 1 tablet (0.4 mg total) under the tongue every 5 (five) minutes as needed for chest pain. 25 tablet 3 More than a month   warfarin (COUMADIN) 1 MG tablet Take 2-3 tablets BY MOUTH daily AS DIRECTED by coumadin clinic (Patient taking differently: Take 3 mg by mouth daily.) 200 tablet 0 01/10/2023 at 0800   Scheduled:   amiodarone  100 mg Oral Daily   aspirin EC  81 mg Oral Daily   atorvastatin  80 mg Oral Daily   Chlorhexidine Gluconate Cloth  6 each Topical Daily   escitalopram  10 mg Oral Daily   ezetimibe  5 mg Oral Daily   Fe Fum-Vit C-Vit B12-FA  1 capsule Oral QPC breakfast   fluticasone furoate-vilanterol  1 puff Inhalation Daily   And   umeclidinium bromide  1 puff Inhalation Daily   gabapentin  600 mg Oral QHS   latanoprost  1 drop Both Eyes QHS   melatonin  3 mg Oral QHS   midodrine  10 mg Oral TID   pantoprazole  40 mg Oral Daily    Assessment: 77 yo male with acute chronic systolic HF on warfarin PTA. Oral anticoagulation to be on hold for cardiac cath. Pharmacy consulted to dose heparin.   INR previously 1.5 (on hold PTA for planned heart cath). Home warfarin regimen: 3mg /day (last dose taken 8/3)  Heparin was not restart post-cath last night given oozing. Bleeding improved - no concerns today. Hgb 10.1, plt 108. Warfarin remains on hold so will restart heparin infusion at previous therapeutic rate of 1250 units/hr this morning.    Goal of Therapy:  Heparin level 0.3-0.7 units/ml Monitor platelets by anticoagulation protocol: Yes   Plan:  -Restart heparin drip 1250 units/hr >> order heparin level in 8 hr -Heparin level daily with CBC daily  Thank you for allowing pharmacy to participate in this patient's care,  Sherron Monday, PharmD, BCCCP Clinical Pharmacist  Phone: [phone] 01/20/2023 10:04  AM  Please check AMION for all [facility] Pharmacy phone numbers After 10:00 PM, call Main Pharmacy [phone]  ADDENDUM Having bleeding at IV sites - reduced heparin rate empirically to 1100 units/hr. Will order heparin level 6 hr after rate change.  Thank you for allowing pharmacy to participate in this patient's care,  Sherron Monday, PharmD, BCCCP Clinical Pharmacist

## 2023-01-20 NOTE — Progress Notes (Signed)
Mobility Specialist Progress Note:   01/20/23 1115  Mobility  Activity Ambulated with assistance in hallway  Level of Assistance Contact guard assist, steadying assist  Assistive Device Front wheel walker  Distance Ambulated (ft) 280 ft  Activity Response Tolerated well  Mobility Referral Yes  $Mobility charge 1 Mobility  Mobility Specialist Start Time (ACUTE ONLY) 1010  Mobility Specialist Stop Time (ACUTE ONLY) 1045  Mobility Specialist Time Calculation (min) (ACUTE ONLY) 35 min    Pre Mobility: 60 HR , 109/66 BP , 97% SpO2 During Mobility: 62 HR , 96% SpO2 Post Mobility: 60 HR , 100% SpO2  Pt received in bed, agreeable to mobility.  Independent to stand. CG during ambulation. Pt denied any feelings of discomfort during session, asymptomatic throughout. Pt left in chair with call bell in reach and all needs met.  Leory Plowman  Mobility Specialist Please contact via [employer] office at [phone]

## 2023-01-20 NOTE — Progress Notes (Signed)
Placed a new internal jugular dressing, and rewrapped his arm due to bleeding, notified provider, Alma stopped by patients room and put orders in to reduce rate of Heparin.

## 2023-01-20 NOTE — Progress Notes (Signed)
ANTICOAGULATION CONSULT NOTE - Follow Up Consult  Pharmacy Consult for heparin Indication: atrial fibrillation  No Known Allergies  Patient Measurements: Height: 6\' 1"  (185.4 cm) Weight: 88.8 kg (195 lb 12.8 oz) IBW/kg (Calculated) : 79.9 HEPARIN DW (KG): 84.8   Vital Signs: Temp: 97.8 F (36.6 C) (08/13 1629) Temp Source: Axillary (08/13 1629) BP: 92/63 (08/13 1629) Pulse Rate: 58 (08/13 1629)  Labs: Recent Labs    01/18/23 0031 01/19/23 0028 01/19/23 0815 01/19/23 1443 01/19/23 1444 01/20/23 0529 01/20/23 1355  HGB 11.4* 10.8*  --  11.2* 11.6* 10.1*  --   HCT 33.1* 30.3*  --  33.0* 34.0* 28.6*  --   PLT 98* 107*  --   --   --  108*  --   HEPARINUNFRC 0.44 0.51  --   --   --   --  0.15*  CREATININE 2.47* 2.97* 2.77*  --   --  2.29*  --     Estimated Creatinine Clearance: 31.5 mL/min (A) (by C-G formula based on SCr of 2.29 mg/dL (H)).   Medical History: Past Medical History:  Diagnosis Date   Aortic atherosclerosis (HCC)    Arthritis    CHF (congestive heart failure) (HCC)    Esophageal stricture    Glaucoma    MI (myocardial infarction) (HCC)    Non-ST elevation (NSTEMI) myocardial infarction (HCC) 10/10/2021   NSTEMI (non-ST elevated myocardial infarction) (HCC) 10/10/2021   Osteoarthritis    Severe mitral regurgitation    Stricture and stenosis of esophagus     Medications:  Medications Prior to Admission  Medication Sig Dispense Refill Last Dose   acetaminophen (TYLENOL) 500 MG tablet Take 250 mg by mouth every 6 (six) hours as needed for moderate pain.   01/15/2023   albuterol (PROVENTIL) (2.5 MG/3ML) 0.083% nebulizer solution Inhale 3 mLs into the lungs every 6 (six) hours as needed for wheezing or shortness of breath. 90 mL 12 01/15/2023   albuterol (VENTOLIN HFA) 108 (90 Base) MCG/ACT inhaler Inhale 2 puffs into the lungs every 6 (six) hours as needed for wheezing or shortness of breath. 8 g 0 01/15/2023   amiodarone (PACERONE) 100 MG tablet TAKE ONE  TABLET BY MOUTH ONCE DAILY 90 tablet 3 01/16/2023 at 0500   aspirin EC (ASPIRIN LOW DOSE) 81 MG tablet TAKE ONE TABLET BY MOUTH ONCE DAILY 90 tablet 3 01/16/2023 at 0500   atorvastatin (LIPITOR) 80 MG tablet TAKE ONE TABLET BY MOUTH EVERYDAY AT BEDTIME (Patient taking differently: Take 80 mg by mouth daily.) 90 tablet 3 01/16/2023 at 0500   cyanocobalamin (VITAMIN B12) 1000 MCG/ML injection Inject 1 mL (1,000 mcg total) into the muscle every 30 (thirty) days. 1 mL 3 Past Month   dapagliflozin propanediol (FARXIGA) 10 MG TABS tablet Take 1 tablet (10 mg total) by mouth daily. 90 tablet 1 01/15/2023   esomeprazole (NEXIUM) 40 MG capsule Take 40 mg by mouth daily.   01/15/2023   ezetimibe (ZETIA) 10 MG tablet Take 0.5 tablets (5 mg total) by mouth daily. 90 tablet 1 01/15/2023   Furosemide (FUROSCIX Wolf Trap) Inject into the skin.   01/13/2023   Iron, Ferrous Sulfate, 325 (65 Fe) MG TABS Take 325 mg by mouth daily. 30 tablet 1 01/15/2023   latanoprost (XALATAN) 0.005 % ophthalmic solution Place 1 drop into both eyes at bedtime.   01/15/2023   metolazone (ZAROXOLYN) 2.5 MG tablet Take 1 tablet (2.5 mg total) by mouth daily as needed. 30 tablet 0 Past Month   midodrine (  PROAMATINE) 10 MG tablet Take 1 tablet (10 mg total) by mouth 3 (three) times daily. 270 tablet 3 01/16/2023 at 0500   OVER THE COUNTER MEDICATION Apply 1 application  topically at bedtime. Medication: Mag Maxx Cream Back and restless leg   Past Week   potassium chloride SA (KLOR-CON M) 20 MEQ tablet TAKE TWO TABLETS BY MOUTH ONCE DAILY 60 tablet 6 01/15/2023 at 0800   spironolactone (ALDACTONE) 25 MG tablet TAKE ONE TABLET BY MOUTH ONCE DAILY 90 tablet 3 01/15/2023   torsemide (DEMADEX) 20 MG tablet Take 4 tablets (80 mg total) by mouth daily. (Patient taking differently: Take 40-80 mg by mouth See admin instructions. Takes 80 mg in the morning and 40 mg at night for 3 days.) 120 tablet 5 01/15/2023   Fluticasone-Umeclidin-Vilant (TRELEGY ELLIPTA) 100-62.5-25 MCG/ACT  AEPB Inhale 1 puff into the lungs daily. (Patient taking differently: Inhale 1 puff into the lungs daily as needed (wheezing).) 1 each 11 More than a month   nitroGLYCERIN (NITROSTAT) 0.4 MG SL tablet Place 1 tablet (0.4 mg total) under the tongue every 5 (five) minutes as needed for chest pain. 25 tablet 3 More than a month   warfarin (COUMADIN) 1 MG tablet Take 2-3 tablets BY MOUTH daily AS DIRECTED by coumadin clinic (Patient taking differently: Take 3 mg by mouth daily.) 200 tablet 0 01/10/2023 at 0800   Scheduled:   amiodarone  100 mg Oral Daily   aspirin EC  81 mg Oral Daily   atorvastatin  80 mg Oral Daily   Chlorhexidine Gluconate Cloth  6 each Topical Daily   escitalopram  10 mg Oral Daily   ezetimibe  5 mg Oral Daily   Fe Fum-Vit C-Vit B12-FA  1 capsule Oral QPC breakfast   fluticasone furoate-vilanterol  1 puff Inhalation Daily   And   umeclidinium bromide  1 puff Inhalation Daily   gabapentin  600 mg Oral QHS   latanoprost  1 drop Both Eyes QHS   melatonin  3 mg Oral QHS   midodrine  10 mg Oral TID   pantoprazole  40 mg Oral Daily    Assessment: 77 yo male with acute chronic systolic HF on warfarin PTA. Oral anticoagulation to be on hold for cardiac cath. Pharmacy consulted to dose heparin.   INR previously 1.5 (on hold PTA for planned heart cath). Home warfarin regimen: 3mg /day (last dose taken 8/3).  S/p cath 8/12. Heparin restarted earlier today 8/13 at reduced rate as pt with some oozing from IV lines. Pt continues with oozing from IV sites so MD going to hold heparin x 6 hours - will restart 8/14 0100.  Goal of Therapy:  Heparin level 0.3-0.7 units/ml Monitor platelets by anticoagulation protocol: Yes   Plan:  Heparin 1100 units/hr to restart 8/13 0100 (held x 6 hours) Will f/u 8 hr heparin level post restart F/u closely for further bleeding  Thank you for allowing pharmacy to participate in this patient's care,  Christoper Fabian, PharmD, BCPS Please see amion for  complete clinical pharmacist phone list 01/20/2023 7:31 PM

## 2023-01-20 NOTE — Progress Notes (Addendum)
Advanced Heart Failure Rounding Note  PCP-Cardiologist: None   Subjective:   Admitted with volume overload and AKI.  01/18/23 Started on DBA 2.5 mcg. Diuretics held.   SCr 3.2 -> 2.6 -> 2.4-> 2.97->2.77->2.29. Sodium 122   RHC 8/12 on DBA 2.5: RA 17, PA 58/28 (41), Fick CO/CI 5.9/2.7, TD CO/CI 4.6/2.2, PVR 3.1, PAPi 1.8. Biventricular HF with moderately depressed CO and elevated filling pressures despite DBA support   Patient feels fine. Feels as though his breathing has much improved. Son at bedside.   Objective:   Weight Range: 88.8 kg Body mass index is 25.83 kg/m.   Vital Signs:   Temp:  [97.7 F (36.5 C)-98.1 F (36.7 C)] 98 F (36.7 C) (08/13 0321) Pulse Rate:  [0-60] 60 (08/13 0321) Resp:  [10-21] 16 (08/13 0321) BP: (89-114)/(58-72) 96/58 (08/13 0321) SpO2:  [94 %-100 %] 94 % (08/13 0321) Weight:  [88.6 kg-88.8 kg] 88.8 kg (08/13 0321) Last BM Date : 01/08/2023  Weight change: Filed Weights   01/19/23 0341 01/19/23 1311 01/20/23 0321  Weight: 90.1 kg 88.6 kg 88.8 kg    Intake/Output:   Intake/Output Summary (Last 24 hours) at 01/20/2023 0733 Last data filed at 01/20/2023 0701 Gross per 24 hour  Intake 406.36 ml  Output 1350 ml  Net -943.64 ml      Physical Exam  General:  Elderly appearing.  No respiratory difficulty HEENT: normal Neck: supple. JVD difficult to see. CVC RIJ. Carotids 2+ bilat; no bruits. No lymphadenopathy or thyromegaly appreciated. Cor: PMI nondisplaced. Regular rate & rhythm. No rubs, gallops or murmurs. Lungs: crackles at bases Abdomen: soft, nontender, nondistended. No hepatosplenomegaly. No bruits or masses. Good bowel sounds. Extremities: no cyanosis, clubbing, rash, edema. + UNNA boots Neuro: alert & oriented x 3, cranial nerves grossly intact. moves all 4 extremities w/o difficulty. Affect pleasant.   Telemetry   NSR 60s (Personally reviewed)    EKG    No new EKG to review  Labs    CBC Recent Labs     01/19/23 0028 01/19/23 1443 01/19/23 1444 01/20/23 0529  WBC 5.5  --   --  7.1  HGB 10.8*   < > 11.6* 10.1*  HCT 30.3*   < > 34.0* 28.6*  MCV 86.3  --   --  88.3  PLT 107*  --   --  108*   < > = values in this interval not displayed.   Basic Metabolic Panel Recent Labs    40/98/11 0028 01/19/23 0815 01/19/23 1443 01/19/23 1444 01/19/23 1609 01/20/23 0529  NA 120* 120*   < > 124* 122* 122*  K 4.0 3.8   < > 4.0  --  4.2  CL 86* 87*  --   --   --  88*  CO2 21* 20*  --   --   --  23  GLUCOSE 113* 134*  --   --   --  115*  BUN 65* 65*  --   --   --  65*  CREATININE 2.97* 2.77*  --   --   --  2.29*  CALCIUM 8.1* 8.2*  --   --   --  8.4*  MG 2.5*  --   --   --   --  2.7*   < > = values in this interval not displayed.   Liver Function Tests Recent Labs    01/19/23 0815  AST 35  ALT 27  ALKPHOS 86  BILITOT 1.4*  PROT 6.6  ALBUMIN 3.0*   No results for input(s): "LIPASE", "AMYLASE" in the last 72 hours. Cardiac Enzymes No results for input(s): "CKTOTAL", "CKMB", "CKMBINDEX", "TROPONINI" in the last 72 hours.  BNP: BNP (last 3 results) Recent Labs    01/01/23 1430 01/03/23 0910 01/07/23 1923  BNP 1,924.8* 1,458.7* 2,208.6*    ProBNP (last 3 results) Recent Labs    01/12/23 1036  PROBNP 9,272*   D-Dimer No results for input(s): "DDIMER" in the last 72 hours. Hemoglobin A1C No results for input(s): "HGBA1C" in the last 72 hours. Fasting Lipid Panel No results for input(s): "CHOL", "HDL", "LDLCALC", "TRIG", "CHOLHDL", "LDLDIRECT" in the last 72 hours. Thyroid Function Tests No results for input(s): "TSH", "T4TOTAL", "T3FREE", "THYROIDAB" in the last 72 hours.  Invalid input(s): "FREET3"  Other results:  Imaging  CARDIAC CATHETERIZATION  Result Date: 01/19/2023 Findings: On dobutamine 2.5 mcg/min RA = 17 RV = 52/17 PA = 58/28 (41) PCW = 23 (v = 31) Fick cardiac output/index = 5.9/2.7 Thermo CO/CI = 4.6/2.2 PVR = 3.1 WU (Fick) 3.9 WU (TD) Ao sat = 97% PA  sat = 63%, 65% PAPI = 1.8 Assessment: 1. Biventricular HF with moderately depressed CO and elevated filling pressures despite DBA support Plan/Discussion: Given worsening renal function despite persistent volume overload and improved cardiac output, suspect he has significant underlying renal disease in addition to end-stage HF which may preclude further advanced cardiac procedures. Arvilla Meres, MD 3:12 PM   PERIPHERAL VASCULAR CATHETERIZATION  Result Date: 01/19/2023 Findings: On dobutamine 2.5 mcg/min RA = 17 RV = 52/17 PA = 58/28 (41) PCW = 23 (v = 31) Fick cardiac output/index = 5.9/2.7 Thermo CO/CI = 4.6/2.2 PVR = 3.1 WU (Fick) 3.9 WU (TD) Ao sat = 97% PA sat = 63%, 65% PAPI = 1.8 Assessment: 1. Biventricular HF with moderately depressed CO and elevated filling pressures despite DBA support Plan/Discussion: Given worsening renal function despite persistent volume overload and improved cardiac output, suspect he has significant underlying renal disease in addition to end-stage HF which may preclude further advanced cardiac procedures. Arvilla Meres, MD 3:12 PM    Medications:   Scheduled Medications:  amiodarone  100 mg Oral Daily   aspirin EC  81 mg Oral Daily   atorvastatin  80 mg Oral Daily   Chlorhexidine Gluconate Cloth  6 each Topical Daily   escitalopram  10 mg Oral Daily   ezetimibe  5 mg Oral Daily   Fe Fum-Vit C-Vit B12-FA  1 capsule Oral QPC breakfast   fluticasone furoate-vilanterol  1 puff Inhalation Daily   And   umeclidinium bromide  1 puff Inhalation Daily   gabapentin  600 mg Oral QHS   latanoprost  1 drop Both Eyes QHS   melatonin  3 mg Oral QHS   midodrine  10 mg Oral TID   pantoprazole  40 mg Oral Daily    Infusions:  DOBUTamine 2.5 mcg/kg/min (01/19/23 1000)   heparin Stopped (01/19/23 1337)    PRN Medications: acetaminophen, albuterol, nitroGLYCERIN, ondansetron (ZOFRAN) IV Assessment/Plan  Acute on chronic systolic heart failure - Echo 13/2/44  showed EF 20-25%, severe LV dysfunction with global HK, + large Left pleural effusion, RV moderately down, moderate aortic stenosis, suspect component of low gradient AS. - RHC (10/23): Elevated filling pressures with moderately decreased cardiac output. RA 8, PA 48/16, PCWP 22, CO/CI (Fick) 5.7/2.7. - Echo 7/24: EF 20-25%, RV mildly reduced, RA/LA severely dilated, trivial MR, no AV regurgitation - RHC 8/12 on DBA 2.5:  RA 17, PA 58/28 (41), Fick CO/CI 5.9/2.7, TD CO/CI 4.6/2.2, PVR 3.1, PAPi 1.8. Biventricular HF with moderately depressed CO and elevated filling pressures despite DBA support  - Felt not to be a suitable VAD candidate (Drs. Gala Romney & Bartle discussed). Patient declined VAD as well. -  NYHA IV. Volume overloaded on admission. -  MAPs remain borderline in setting of AS despite midodrine and DBA - Continue DBA 2.5. 8/12 diuretics held. Renal function improving but Na remains low - Volume overloaded by RHC. Difficult to see on exam. Having RN set up CVP. Will hold off on further lasix for now with Na 122.  - GDMT on hold with AKI and hypotension - Continue midodrine 10 mg tid. - Continue Unna boots - He has end-stage HF and severe AS now with volume overload and AKI. Admitted for diuresis and possible inotropic support. Worried that the main issue may be his cardiomyopathy and that TAVR might not be enough to improve his condition. Continue aggressive care for now -We discussed possible need for Palliative Care. Discussed again he is likely end stage.   Severe aortic stenosis, LVLG AS - Moderate on echo 10/23  - ?? Seen on 7/24 echo - Admitted for TAVR eval, needs volume optimization first.  - LHC heart cath pending renal function improvement. RHC as above   AKI on CKD stage 4 - SCr 3.2, baseline 1.8-2.1 - Scr now down trending after holding diuretics. 3.2 -> 2.6 -> 2.4- >3>2.7>2.29 today - Avoid hypotension    CAD s/p late presentation inferior STEMI - LHC 05/23: 100% m RCA  and 90% m LAD.  - RCA managed medically d/t completed infarct.  - Echo (5/23) EF 25%, RV moderately reduced, moderate to severe MR - Echo (10/23): EF 20-25%, RV moderately down. - s/p CABG x 2 (LIMA-LAD, SVG-Ramus)  (11/01/21) - No s/s angina - Continue ASA + high intensity statin   Mitral regurgitation - Moderate to severe ischemic MR on TEE.  - S/p bioprosthetic MVR 11/02/21. - Warfarin on hold, restart heparin drip   Hyponatremia - Na 120 today. Given 15 mg tolvaptan 8/13.  - Na 120>124>122 today - repeat tolvaptan today - Limit free water.     H/O pericardial effusion/pericarditis - Moderate in size on past echo, w/o tamponade. - Acute pericarditis/ adhensions noted at time of CABG. - Resolved on echo 10/23 and not seen on echo 7/24   PAF - s/p MAZE w/ CABG  - Continue amiodarone 100 mg daily.  - Remains in NSR. Restart heparin drip   Length of Stay: 4  Alen Bleacher, NP  01/20/2023, 7:33 AM  Advanced Heart Failure Team Pager [phone] (M-F; 7a - 5p)  Please contact [facility] for night-coverage after hours (5p -7a ) and weekends on [email or web address]    Patient seen and examined with the above-signed Advanced Practice Provider and/or Housestaff. I personally reviewed laboratory data, imaging studies and relevant notes. I independently examined the patient and formulated the important aspects of the plan. I have edited the note to reflect any of my changes or salient points. I have personally discussed the plan with the patient and/or family.  Remains on DBA 2.5. and IV lasix. Modest diuresis. Denies CP or SOB. Co-ox 49%  General:  Weak appearing. No resp difficulty HEENT: normal Neck: supple. JVP to jaw  Carotids 2+ bilat; no bruits. No lymphadenopathy or thryomegaly appreciated. Cor: Distant HS. RRR + AS Lungs: clear Abdomen: soft, nontender, nondistended. No hepatosplenomegaly. No bruits or  masses. Good bowel sounds. Extremities: no cyanosis, clubbing, rash, 1+ edema  multiple skin tears Neuro: alert & orientedx3, cranial nerves grossly intact. moves all 4 extremities w/o difficulty. Affect pleasant   Remains very tenuous. Scr slightly better but co-ox remains low despite DBA.   Will repeat co-ox and can increase DBA as needed. Continue diuresis. Follow Scr.  I am increasingly concerned about his candidacy for TAVR with multiple system organ failure and progressive general decline.  Arvilla Meres, MD  5:36 PM

## 2023-01-21 ENCOUNTER — Telehealth: Payer: Self-pay | Admitting: Family Medicine

## 2023-01-21 ENCOUNTER — Telehealth: Payer: Medicare HMO

## 2023-01-21 DIAGNOSIS — N1832 Chronic kidney disease, stage 3b: Secondary | ICD-10-CM | POA: Diagnosis not present

## 2023-01-21 DIAGNOSIS — J449 Chronic obstructive pulmonary disease, unspecified: Secondary | ICD-10-CM | POA: Diagnosis not present

## 2023-01-21 DIAGNOSIS — D696 Thrombocytopenia, unspecified: Secondary | ICD-10-CM | POA: Diagnosis not present

## 2023-01-21 DIAGNOSIS — I480 Paroxysmal atrial fibrillation: Secondary | ICD-10-CM | POA: Diagnosis not present

## 2023-01-21 DIAGNOSIS — I2510 Atherosclerotic heart disease of native coronary artery without angina pectoris: Secondary | ICD-10-CM | POA: Diagnosis not present

## 2023-01-21 DIAGNOSIS — I252 Old myocardial infarction: Secondary | ICD-10-CM | POA: Diagnosis not present

## 2023-01-21 DIAGNOSIS — I700 Atherosclerosis of aorta: Secondary | ICD-10-CM | POA: Diagnosis not present

## 2023-01-21 DIAGNOSIS — I5023 Acute on chronic systolic (congestive) heart failure: Secondary | ICD-10-CM | POA: Diagnosis not present

## 2023-01-21 DIAGNOSIS — E871 Hypo-osmolality and hyponatremia: Secondary | ICD-10-CM | POA: Diagnosis not present

## 2023-01-21 DIAGNOSIS — I130 Hypertensive heart and chronic kidney disease with heart failure and stage 1 through stage 4 chronic kidney disease, or unspecified chronic kidney disease: Secondary | ICD-10-CM | POA: Diagnosis not present

## 2023-01-21 DIAGNOSIS — E785 Hyperlipidemia, unspecified: Secondary | ICD-10-CM | POA: Diagnosis not present

## 2023-01-21 DIAGNOSIS — N179 Acute kidney failure, unspecified: Secondary | ICD-10-CM | POA: Diagnosis not present

## 2023-01-21 LAB — HEPARIN LEVEL (UNFRACTIONATED): Heparin Unfractionated: 0.13 [IU]/mL — ABNORMAL LOW (ref 0.30–0.70)

## 2023-01-21 LAB — COOXEMETRY PANEL
Carboxyhemoglobin: 2.2 % — ABNORMAL HIGH (ref 0.5–1.5)
Methemoglobin: <0.7 % (ref 0.0–1.5)
O2 Saturation: 69.7 %
Total hemoglobin: 10.3 g/dL — ABNORMAL LOW (ref 12.0–16.0)

## 2023-01-21 MED ORDER — POTASSIUM CHLORIDE CRYS ER 20 MEQ PO TBCR
40.0000 meq | EXTENDED_RELEASE_TABLET | Freq: Once | ORAL | Status: AC
  Administered 2023-01-21: 40 meq via ORAL
  Filled 2023-01-21: qty 2

## 2023-01-21 MED ORDER — FUROSEMIDE 10 MG/ML IJ SOLN
120.0000 mg | Freq: Once | INTRAVENOUS | Status: AC
  Administered 2023-01-21: 120 mg via INTRAVENOUS
  Filled 2023-01-21: qty 10

## 2023-01-21 NOTE — Telephone Encounter (Signed)
[facility] [phone]

## 2023-01-21 NOTE — Progress Notes (Addendum)
Advanced Heart Failure Rounding Note  PCP-Cardiologist: None   Subjective:   Admitted with volume overload and AKI.  01/18/23 Started on DBA 2.5 mcg. Diuretics held.   RHC 8/12 on DBA 2.5: RA 17, PA 58/28 (41), Fick CO/CI 5.9/2.7, TD CO/CI 4.6/2.2, PVR 3.1, PAPi 1.8. Biventricular HF with moderately depressed CO and elevated filling pressures despite DBA support   Remains on DBA 2.5. Co-ox better today at 70%  SCr trending down 3.2 -> 2.6 -> 2.4-> 2.97->2.77->2.29->2.19. Sodium 120. CVP 14-16  No resting dyspnea but c/w DOE. SOB walking around unit. Slept well last night w/ HOB elevated. Denies CP.  Continues on heparin gtt. No further bleeding from internal jugular and PIV sites.     Objective:   Weight Range: 89.8 kg Body mass index is 26.12 kg/m.   Vital Signs:   Temp:  [96.5 F (35.8 C)-98 F (36.7 C)] 98 F (36.7 C) (08/14 0705) Pulse Rate:  [54-60] 59 (08/14 0705) Resp:  [13-16] 13 (08/14 0705) BP: (92-107)/(46-65) 92/47 (08/14 0705) SpO2:  [95 %-100 %] 95 % (08/14 0356) Weight:  [89.8 kg] 89.8 kg (08/14 0227) Last BM Date : 01/23/2023  Weight change: Filed Weights   01/19/23 1311 01/20/23 0321 01/21/23 0227  Weight: 88.6 kg 88.8 kg 89.8 kg    Intake/Output:   Intake/Output Summary (Last 24 hours) at 01/21/2023 0750 Last data filed at 01/21/2023 0359 Gross per 24 hour  Intake 820.52 ml  Output 650 ml  Net 170.52 ml      Physical Exam   CVP 14-16  General:  fatigued appearing elderly male. No respiratory difficulty HEENT: normal Neck: supple. JVD to jaw. + Rt internal jugular CVC, Carotids 2+ bilat; no bruits. No lymphadenopathy or thyromegaly appreciated. Cor: PMI nondisplaced. Regular rate & rhythm. 2/6 SEM  Lungs: decreased BS at the bases bilaterally  Abdomen: soft, nontender, nondistended. No hepatosplenomegaly. No bruits or masses. Good bowel sounds. Extremities: no cyanosis, clubbing, rash, trace b/l LE edema Neuro: alert & oriented x 3,  cranial nerves grossly intact. moves all 4 extremities w/o difficulty. Affect pleasant.   Telemetry   SB-NSR upper 50s-low 60s  (Personally reviewed)    EKG    No new EKG to review  Labs    CBC Recent Labs    01/20/23 0529 01/21/23 0212  WBC 7.1 6.5  HGB 10.1* 9.8*  HCT 28.6* 28.2*  MCV 88.3 88.7  PLT 108* 103*   Basic Metabolic Panel Recent Labs    09/81/19 0529 01/20/23 1355 01/21/23 0212  NA 122* 121* 120*  K 4.2  --  3.9  CL 88*  --  89*  CO2 23  --  21*  GLUCOSE 115*  --  160*  BUN 65*  --  68*  CREATININE 2.29*  --  2.19*  CALCIUM 8.4*  --  8.5*  MG 2.7*  --  2.7*   Liver Function Tests Recent Labs    01/19/23 0815  AST 35  ALT 27  ALKPHOS 86  BILITOT 1.4*  PROT 6.6  ALBUMIN 3.0*   No results for input(s): "LIPASE", "AMYLASE" in the last 72 hours. Cardiac Enzymes No results for input(s): "CKTOTAL", "CKMB", "CKMBINDEX", "TROPONINI" in the last 72 hours.  BNP: BNP (last 3 results) Recent Labs    01/01/23 1430 01/03/23 0910 01/07/23 1923  BNP 1,924.8* 1,458.7* 2,208.6*    ProBNP (last 3 results) Recent Labs    01/12/23 1036  PROBNP 9,272*   D-Dimer No results for  input(s): "DDIMER" in the last 72 hours. Hemoglobin A1C No results for input(s): "HGBA1C" in the last 72 hours. Fasting Lipid Panel No results for input(s): "CHOL", "HDL", "LDLCALC", "TRIG", "CHOLHDL", "LDLDIRECT" in the last 72 hours. Thyroid Function Tests No results for input(s): "TSH", "T4TOTAL", "T3FREE", "THYROIDAB" in the last 72 hours.  Invalid input(s): "FREET3"  Other results:  Imaging  No results found.  Medications:   Scheduled Medications:  amiodarone  100 mg Oral Daily   aspirin EC  81 mg Oral Daily   atorvastatin  80 mg Oral Daily   Chlorhexidine Gluconate Cloth  6 each Topical Daily   escitalopram  10 mg Oral Daily   ezetimibe  5 mg Oral Daily   Fe Fum-Vit C-Vit B12-FA  1 capsule Oral QPC breakfast   fluticasone furoate-vilanterol  1 puff  Inhalation Daily   And   umeclidinium bromide  1 puff Inhalation Daily   gabapentin  600 mg Oral QHS   latanoprost  1 drop Both Eyes QHS   melatonin  3 mg Oral QHS   midodrine  10 mg Oral TID   pantoprazole  40 mg Oral Daily    Infusions:  DOBUTamine 2.5 mcg/kg/min (01/19/23 1000)   heparin 1,100 Units/hr (01/21/23 0115)    PRN Medications: acetaminophen, albuterol, nitroGLYCERIN, ondansetron (ZOFRAN) IV Assessment/Plan  Acute on chronic systolic heart failure - Echo 51/8/84 showed EF 20-25%, severe LV dysfunction with global HK, + large Left pleural effusion, RV moderately down, moderate aortic stenosis, suspect component of low gradient AS. - RHC (10/23): Elevated filling pressures with moderately decreased cardiac output. RA 8, PA 48/16, PCWP 22, CO/CI (Fick) 5.7/2.7. - Echo 7/24: EF 20-25%, RV mildly reduced, RA/LA severely dilated, trivial MR, no AV regurgitation - RHC 8/12 on DBA 2.5: RA 17, PA 58/28 (41), Fick CO/CI 5.9/2.7, TD CO/CI 4.6/2.2, PVR 3.1, PAPi 1.8. Biventricular HF with moderately depressed CO and elevated filling pressures despite DBA support  - Felt not to be a suitable VAD candidate (Drs. Gala Romney & Bartle discussed). Patient declined VAD as well. -  NYHA IV. Volume overloaded on admission. -  MAPs remain borderline in setting of AS despite midodrine and DBA - Continue DBA 2.5. 8/12 diuretics held. Renal function improving but Na remains low - Volume overloaded by RHC. CVP ~15 today. Restart IV Lasix today   - GDMT on hold with AKI and hypotension - Continue midodrine 10 mg tid. - Continue Unna boots - He has end-stage HF and severe AS now with volume overload and AKI. Admitted for diuresis and possible inotropic support. Worried that the main issue may be his cardiomyopathy and that TAVR might not be enough to improve his condition. Continue aggressive care for now -We discussed possible need for Palliative Care. Discussed again he is likely end stage.    Severe aortic stenosis, LVLG AS - Moderate on echo 10/23  - ?? Seen on 7/24 echo - Admitted for TAVR eval, needs volume optimization first.  - LHC heart cath pending renal function improvement. RHC as above   AKI on CKD stage 4 - SCr 3.2, baseline 1.8-2.1 - Scr now down trending after holding diuretics. 3.2 -> 2.6 -> 2.4- >3>2.7>2.29>2.19 today - Avoid hypotension    CAD s/p late presentation inferior STEMI - LHC 05/23: 100% m RCA and 90% m LAD.  - RCA managed medically d/t completed infarct.  - Echo (5/23) EF 25%, RV moderately reduced, moderate to severe MR - Echo (10/23): EF 20-25%, RV moderately down. - s/p  CABG x 2 (LIMA-LAD, SVG-Ramus)  (11/01/21) - No s/s angina - Continue ASA + high intensity statin   Mitral regurgitation - Moderate to severe ischemic MR on TEE.  - S/p bioprosthetic MVR 11/02/21. - Warfarin on hold, cont heparin drip   Hyponatremia - Na 120 today. Given 15 mg tolvaptan 8/13.  - Na 120>124>122>120 today - repeat tolvaptan today - Limit free water.     H/O pericardial effusion/pericarditis - Moderate in size on past echo, w/o tamponade. - Acute pericarditis/ adhensions noted at time of CABG. - Resolved on echo 10/23 and not seen on echo 7/24   PAF - s/p MAZE w/ CABG  - Continue amiodarone 100 mg daily.  - Remains in NSR. Cont heparin drip   Length of Stay: 5  Brittainy Simmons, PA-C  01/21/2023, 7:50 AM  Advanced Heart Failure Team Pager [phone] (M-F; 7a - 5p)  Please contact [facility] for night-coverage after hours (5p -7a ) and weekends on [email or web address]   Patient seen and examined with the above-signed Advanced Practice Provider and/or Housestaff. I personally reviewed laboratory data, imaging studies and relevant notes. I independently examined the patient and formulated the important aspects of the plan. I have edited the note to reflect any of my changes or salient points. I have personally discussed the plan with the patient and/or  family.  Remains on DBA. Diuresing reasonable well. Very weak SOB with minimal activity. Heparin had to be held last night due to arm bleeding. Improved this am .  CVP 15. SNa 120  General:  Weak appearing. No resp difficulty HEENT: normal Neck: supple. JVP to jaw  Carotids 2+ bilat; no bruits. No lymphadenopathy or thryomegaly appreciated. Cor: PMI nondisplaced. Regular rate & rhythm.2/6 AS Lungs: clear Abdomen: soft, nontender, nondistended. No hepatosplenomegaly. No bruits or masses. Good bowel sounds. Extremities: no cyanosis, clubbing, rash, 1+ edema diffuse ecchymosis and skin tears. Neuro: alert & orientedx3, cranial nerves grossly intact. moves all 4 extremities w/o difficulty. Affect pleasant  Remains volume overloaded. Continue DBA and IV diuresis.   I am increasingly concerned about his candidacy for TAVR with multiple system organ failure and progressive general decline. Will d/w Structural team again today.   Arvilla Meres, MD  1:13 PM

## 2023-01-21 NOTE — Progress Notes (Signed)
Mobility Specialist Progress Note:   01/21/23 1104  Mobility  Activity Ambulated with assistance in hallway  Level of Assistance Contact guard assist, steadying assist  Assistive Device Front wheel walker  Distance Ambulated (ft) 500 ft  Activity Response Tolerated well  Mobility Referral Yes  $Mobility charge 1 Mobility  Mobility Specialist Start Time (ACUTE ONLY) 0950  Mobility Specialist Stop Time (ACUTE ONLY) 1010  Mobility Specialist Time Calculation (min) (ACUTE ONLY) 20 min   Pre Mobility: 60 HR , 90/55 BP  During Mobility: 68 HR  91% SpO2 Post Mobility: 59 HR ,105/61 BP  Pt received in bed, agreeable to mobility. Standing rest break required d/t slight lightheadedness otherwise asymptomatic throughout. Pt eager to ambulate greater distance than yesterday. Pt left in bed with VSS and NT present in room.     Leory Plowman  Mobility Specialist Please contact via [employer] office at [phone]

## 2023-01-21 NOTE — Progress Notes (Signed)
ANTICOAGULATION CONSULT NOTE - Follow Up Consult  Pharmacy Consult for heparin Indication: atrial fibrillation  No Known Allergies  Patient Measurements: Height: 6\' 1"  (185.4 cm) Weight: 89.8 kg (197 lb 15.6 oz) IBW/kg (Calculated) : 79.9 HEPARIN DW (KG): 84.8   Vital Signs: Temp: 97.6 F (36.4 C) (08/14 1114) Temp Source: Axillary (08/14 1114) BP: 93/58 (08/14 1219) Pulse Rate: 55 (08/14 1114)  Labs: Recent Labs    01/19/23 0028 01/19/23 0815 01/19/23 1443 01/19/23 1444 01/20/23 0529 01/20/23 1355 01/21/23 0212 01/21/23 0940  HGB 10.8*  --    < > 11.6* 10.1*  --  9.8*  --   HCT 30.3*  --    < > 34.0* 28.6*  --  28.2*  --   PLT 107*  --   --   --  108*  --  103*  --   HEPARINUNFRC 0.51  --   --   --   --  0.15*  --  0.13*  CREATININE 2.97* 2.77*  --   --  2.29*  --  2.19*  --    < > = values in this interval not displayed.    Estimated Creatinine Clearance: 32.9 mL/min (A) (by C-G formula based on SCr of 2.19 mg/dL (H)).   Medical History: Past Medical History:  Diagnosis Date   Aortic atherosclerosis (HCC)    Arthritis    CHF (congestive heart failure) (HCC)    Esophageal stricture    Glaucoma    MI (myocardial infarction) (HCC)    Non-ST elevation (NSTEMI) myocardial infarction (HCC) 10/10/2021   NSTEMI (non-ST elevated myocardial infarction) (HCC) 10/10/2021   Osteoarthritis    Severe mitral regurgitation    Stricture and stenosis of esophagus     Medications:  Medications Prior to Admission  Medication Sig Dispense Refill Last Dose   acetaminophen (TYLENOL) 500 MG tablet Take 250 mg by mouth every 6 (six) hours as needed for moderate pain.   01/15/2023   albuterol (PROVENTIL) (2.5 MG/3ML) 0.083% nebulizer solution Inhale 3 mLs into the lungs every 6 (six) hours as needed for wheezing or shortness of breath. 90 mL 12 01/15/2023   albuterol (VENTOLIN HFA) 108 (90 Base) MCG/ACT inhaler Inhale 2 puffs into the lungs every 6 (six) hours as needed for  wheezing or shortness of breath. 8 g 0 01/15/2023   amiodarone (PACERONE) 100 MG tablet TAKE ONE TABLET BY MOUTH ONCE DAILY 90 tablet 3 01/16/2023 at 0500   aspirin EC (ASPIRIN LOW DOSE) 81 MG tablet TAKE ONE TABLET BY MOUTH ONCE DAILY 90 tablet 3 01/16/2023 at 0500   atorvastatin (LIPITOR) 80 MG tablet TAKE ONE TABLET BY MOUTH EVERYDAY AT BEDTIME (Patient taking differently: Take 80 mg by mouth daily.) 90 tablet 3 01/16/2023 at 0500   cyanocobalamin (VITAMIN B12) 1000 MCG/ML injection Inject 1 mL (1,000 mcg total) into the muscle every 30 (thirty) days. 1 mL 3 Past Month   dapagliflozin propanediol (FARXIGA) 10 MG TABS tablet Take 1 tablet (10 mg total) by mouth daily. 90 tablet 1 01/15/2023   esomeprazole (NEXIUM) 40 MG capsule Take 40 mg by mouth daily.   01/15/2023   ezetimibe (ZETIA) 10 MG tablet Take 0.5 tablets (5 mg total) by mouth daily. 90 tablet 1 01/15/2023   Furosemide (FUROSCIX [place]) Inject into the skin.   01/13/2023   Iron, Ferrous Sulfate, 325 (65 Fe) MG TABS Take 325 mg by mouth daily. 30 tablet 1 01/15/2023   latanoprost (XALATAN) 0.005 % ophthalmic solution Place 1 drop  into both eyes at bedtime.   01/15/2023   metolazone (ZAROXOLYN) 2.5 MG tablet Take 1 tablet (2.5 mg total) by mouth daily as needed. 30 tablet 0 Past Month   midodrine (PROAMATINE) 10 MG tablet Take 1 tablet (10 mg total) by mouth 3 (three) times daily. 270 tablet 3 01/16/2023 at 0500   OVER THE COUNTER MEDICATION Apply 1 application  topically at bedtime. Medication: Mag Maxx Cream Back and restless leg   Past Week   potassium chloride SA (KLOR-CON M) 20 MEQ tablet TAKE TWO TABLETS BY MOUTH ONCE DAILY 60 tablet 6 01/15/2023 at 0800   spironolactone (ALDACTONE) 25 MG tablet TAKE ONE TABLET BY MOUTH ONCE DAILY 90 tablet 3 01/15/2023   torsemide (DEMADEX) 20 MG tablet Take 4 tablets (80 mg total) by mouth daily. (Patient taking differently: Take 40-80 mg by mouth See admin instructions. Takes 80 mg in the morning and 40 mg at night for 3  days.) 120 tablet 5 01/15/2023   Fluticasone-Umeclidin-Vilant (TRELEGY ELLIPTA) 100-62.5-25 MCG/ACT AEPB Inhale 1 puff into the lungs daily. (Patient taking differently: Inhale 1 puff into the lungs daily as needed (wheezing).) 1 each 11 More than a month   nitroGLYCERIN (NITROSTAT) 0.4 MG SL tablet Place 1 tablet (0.4 mg total) under the tongue every 5 (five) minutes as needed for chest pain. 25 tablet 3 More than a month   warfarin (COUMADIN) 1 MG tablet Take 2-3 tablets BY MOUTH daily AS DIRECTED by coumadin clinic (Patient taking differently: Take 3 mg by mouth daily.) 200 tablet 0 01/10/2023 at 0800   Scheduled:   amiodarone  100 mg Oral Daily   aspirin EC  81 mg Oral Daily   atorvastatin  80 mg Oral Daily   Chlorhexidine Gluconate Cloth  6 each Topical Daily   escitalopram  10 mg Oral Daily   ezetimibe  5 mg Oral Daily   Fe Fum-Vit C-Vit B12-FA  1 capsule Oral QPC breakfast   fluticasone furoate-vilanterol  1 puff Inhalation Daily   And   umeclidinium bromide  1 puff Inhalation Daily   gabapentin  600 mg Oral QHS   latanoprost  1 drop Both Eyes QHS   melatonin  3 mg Oral QHS   midodrine  10 mg Oral TID   pantoprazole  40 mg Oral Daily   potassium chloride  40 mEq Oral Once    Assessment: 77 yo male with acute chronic systolic HF on warfarin PTA. Oral anticoagulation to be on hold for cardiac cath. Pharmacy consulted to dose heparin.   INR previously 1.5 (on hold PTA for planned heart cath). Home warfarin regimen: 3mg /day (last dose taken 8/3).  S/p cath 8/12 Heparin restarted 8/13 at reduced rate as pt with some oozing from IV lines MD held heparin x 6 hours - will restart 8/14 0100 on drip rate 1100 uts/hr with heparin level low 0.13 oozing improving  - will continue at current rate and if resolved in am will increase as needed  Goal of Therapy:  Heparin level 0.3-0.7 units/ml Monitor platelets by anticoagulation protocol: Yes   Plan:  Continue Heparin 1100 units/hr  Daily  heparin level and cbc  F/u closely for further bleeding    Leota Sauers Pharm.D. CPP, BCPS Clinical Pharmacist [phone] 01/21/2023 2:47 PM

## 2023-01-22 ENCOUNTER — Encounter (HOSPITAL_COMMUNITY): Payer: Self-pay | Admitting: Internal Medicine

## 2023-01-22 DIAGNOSIS — I5023 Acute on chronic systolic (congestive) heart failure: Secondary | ICD-10-CM | POA: Diagnosis not present

## 2023-01-22 LAB — CBC
HCT: 26.5 % — ABNORMAL LOW (ref 39.0–52.0)
Hemoglobin: 9.4 g/dL — ABNORMAL LOW (ref 13.0–17.0)
MCH: 32 pg (ref 26.0–34.0)
MCHC: 35.5 g/dL (ref 30.0–36.0)
MCV: 90.1 fL (ref 80.0–100.0)
Platelets: 94 10*3/uL — ABNORMAL LOW (ref 150–400)
RBC: 2.94 MIL/uL — ABNORMAL LOW (ref 4.22–5.81)
RDW: 17 % — ABNORMAL HIGH (ref 11.5–15.5)
WBC: 6.3 10*3/uL (ref 4.0–10.5)
nRBC: 0 % (ref 0.0–0.2)

## 2023-01-22 LAB — BASIC METABOLIC PANEL
Anion gap: 12 (ref 5–15)
BUN: 73 mg/dL — ABNORMAL HIGH (ref 8–23)
CO2: 21 mmol/L — ABNORMAL LOW (ref 22–32)
Calcium: 8.4 mg/dL — ABNORMAL LOW (ref 8.9–10.3)
Chloride: 89 mmol/L — ABNORMAL LOW (ref 98–111)
Creatinine, Ser: 2.41 mg/dL — ABNORMAL HIGH (ref 0.61–1.24)
GFR, Estimated: 27 mL/min — ABNORMAL LOW (ref >60)
Glucose, Bld: 104 mg/dL — ABNORMAL HIGH (ref 70–99)
Potassium: 4.4 mmol/L (ref 3.5–5.1)
Sodium: 122 mmol/L — ABNORMAL LOW (ref 135–145)

## 2023-01-22 LAB — COOXEMETRY PANEL
Carboxyhemoglobin: 1.8 % — ABNORMAL HIGH (ref 0.5–1.5)
Methemoglobin: <0.7 % (ref 0.0–1.5)
O2 Saturation: 57.8 %
Total hemoglobin: 9.5 g/dL — ABNORMAL LOW (ref 12.0–16.0)

## 2023-01-22 LAB — HEPARIN LEVEL (UNFRACTIONATED): Heparin Unfractionated: 0.23 [IU]/mL — ABNORMAL LOW (ref 0.30–0.70)

## 2023-01-22 LAB — SODIUM: Sodium: 117 mmol/L — CL (ref 135–145)

## 2023-01-22 LAB — MAGNESIUM: Magnesium: 2.8 mg/dL — ABNORMAL HIGH (ref 1.7–2.4)

## 2023-01-22 MED ORDER — FUROSEMIDE 10 MG/ML IJ SOLN
160.0000 mg | Freq: Two times a day (BID) | INTRAVENOUS | Status: DC
  Administered 2023-01-22 – 2023-01-23 (×3): 160 mg via INTRAVENOUS
  Filled 2023-01-22 (×5): qty 16

## 2023-01-22 MED ORDER — FUROSEMIDE 10 MG/ML IJ SOLN
80.0000 mg | Freq: Two times a day (BID) | INTRAMUSCULAR | Status: DC
  Administered 2023-01-22: 80 mg via INTRAVENOUS
  Filled 2023-01-22: qty 8

## 2023-01-22 MED ORDER — FUROSEMIDE 10 MG/ML IJ SOLN: 80.0000 mg | Freq: Two times a day (BID) | INTRAMUSCULAR | Status: DC

## 2023-01-22 MED ORDER — TOLVAPTAN 15 MG PO TABS
15.0000 mg | ORAL_TABLET | Freq: Once | ORAL | Status: AC
  Administered 2023-01-22: 15 mg via ORAL
  Filled 2023-01-22: qty 1

## 2023-01-22 MED ORDER — TOLVAPTAN 15 MG PO TABS
15.0000 mg | ORAL_TABLET | Freq: Once | ORAL | Status: DC
  Filled 2023-01-22: qty 1

## 2023-01-22 MED ORDER — PHENYLEPHRINE HCL 0.5 % NA SOLN: 1.0000 [drp] | Freq: Four times a day (QID) | NASAL | Status: DC | PRN

## 2023-01-22 NOTE — Progress Notes (Addendum)
[facility]   MULTIDISCIPLINARY HEART VALVE TEAM  Patient Name: Keith Jenkins Date of Encounter: 01/23/2023  Primary Cardiologist: Dr. Gala Romney, MD   Hospital Problem List     Principal Problem:   Acute on chronic systolic CHF (congestive heart failure) (HCC) Active Problems:   Stage 3b chronic kidney disease (CKD) (HCC)   Hypotension   Paroxysmal atrial fibrillation (HCC)   Hyponatremia   Aortic valve stenosis   Subjective   Patient sitting at bedside with no complaints including chest pain or SOB this morning. Discussed not pursing further TAVR workup at length with patient and family.   Inpatient Medications    Scheduled Meds:  amiodarone  100 mg Oral Daily   aspirin EC  81 mg Oral Daily   atorvastatin  80 mg Oral Daily   Chlorhexidine Gluconate Cloth  6 each Topical Daily   escitalopram  10 mg Oral Daily   ezetimibe  5 mg Oral Daily   Fe Fum-Vit C-Vit B12-FA  1 capsule Oral QPC breakfast   fluticasone furoate-vilanterol  1 puff Inhalation Daily   And   umeclidinium bromide  1 puff Inhalation Daily   gabapentin  600 mg Oral QHS   latanoprost  1 drop Both Eyes QHS   melatonin  3 mg Oral QHS   midodrine  10 mg Oral TID   pantoprazole  40 mg Oral Daily   tolvaptan  30 mg Oral Once   Continuous Infusions:  DOBUTamine 2.5 mcg/kg/min (01/22/23 1828)   furosemide 66 mL/hr at 01/22/23 1828   heparin 1,200 Units/hr (01/22/23 1828)   PRN Meds: acetaminophen, albuterol, nitroGLYCERIN, ondansetron (ZOFRAN) IV, mouth rinse, phenylephrine   Vital Signs    Vitals:   01/22/23 1926 01/22/23 2318 01/23/23 0445 01/23/23 0738  BP: 99/60 93/60 (!) 97/55 (!) 90/57  Pulse:    (!) 57  Resp: 19 20 19 19   Temp: 97.8 F (36.6 C) 97.8 F (36.6 C) 97.9 F (36.6 C) 97.8 F (36.6 C)  TempSrc: Oral Oral Oral Oral  SpO2:    96%  Weight:   92.2 kg   Height:        Intake/Output Summary (Last 24 hours) at 01/23/2023 0853 Last data filed at 01/23/2023 0800 Gross  per 24 hour  Intake 1685.89 ml  Output 1100 ml  Net 585.89 ml   Filed Weights   01/21/23 0227 01/22/23 0514 01/23/23 0445  Weight: 89.8 kg 90.1 kg 92.2 kg   Physical Exam    General: Elderly, well developed, well nourished, NAD Lungs:Clear to ausculation bilaterally. No wheezes, rales, or rhonchi. Breathing is unlabored. Cardiovascular: RRR with S1 S2. + murmurs Abdomen: Soft, non-tender, non-distended Extremities: 2+ BLE with una boots in place.   Neuro: Alert and oriented. No focal deficits. No facial asymmetry. MAE spontaneously. Psych: Responds to questions appropriately with normal affect.    Labs    CBC Recent Labs    01/22/23 0518 01/23/23 0400  WBC 6.3 6.9  HGB 9.4* 8.7*  HCT 26.5* 24.8*  MCV 90.1 89.9  PLT 94* 93*   Basic Metabolic Panel Recent Labs    95/62/13 0518 01/22/23 2015 01/23/23 0400  NA 122* 117* 120*  K 4.4  --  4.4  CL 89*  --  90*  CO2 21*  --  19*  GLUCOSE 104*  --  104*  BUN 73*  --  80*  CREATININE 2.41*  --  2.43*  CALCIUM 8.4*  --  8.1*  MG 2.8*  --  2.7*   Liver Function Tests No results for input(s): "AST", "ALT", "ALKPHOS", "BILITOT", "PROT", "ALBUMIN" in the last 72 hours. No results for input(s): "LIPASE", "AMYLASE" in the last 72 hours. Cardiac Enzymes No results for input(s): "CKTOTAL", "CKMB", "CKMBINDEX", "TROPONINI" in the last 72 hours. BNP Invalid input(s): "POCBNP" D-Dimer No results for input(s): "DDIMER" in the last 72 hours. Hemoglobin A1C No results for input(s): "HGBA1C" in the last 72 hours. Fasting Lipid Panel No results for input(s): "CHOL", "HDL", "LDLCALC", "TRIG", "CHOLHDL", "LDLDIRECT" in the last 72 hours. Thyroid Function Tests No results for input(s): "TSH", "T4TOTAL", "T3FREE", "THYROIDAB" in the last 72 hours.  Invalid input(s): "FREET3"  Telemetry    01/23/23 NSR - Personally Reviewed  ECG    No new tracing as of 01/23/23 - Personally Reviewed  Radiology    No results found.  Cardiac  Studies   Cardiac Studies & Procedures   CARDIAC CATHETERIZATION  CARDIAC CATHETERIZATION 01/19/2023  Narrative Findings:  On dobutamine 2.5 mcg/min  RA = 17 RV = 52/17 PA = 58/28 (41) PCW = 23 (v = 31) Fick cardiac output/index = 5.9/2.7 Thermo CO/CI = 4.6/2.2 PVR = 3.1 WU (Fick) 3.9 WU (TD) Ao sat = 97% PA sat = 63%, 65% PAPI = 1.8  Assessment: 1. Biventricular HF with moderately depressed CO and elevated filling pressures despite DBA support  Plan/Discussion:  Given worsening renal function despite persistent volume overload and improved cardiac output, suspect he has significant underlying renal disease in addition to end-stage HF which may preclude further advanced cardiac procedures.  Arvilla Meres, MD 3:12 PM   CARDIAC CATHETERIZATION  CARDIAC CATHETERIZATION 03/17/2022  Narrative Findings:  RA = 8 RV = 45/10 PA = 48/16 (28) PCW = 22 (v = 30) Fick cardiac output/index = 5.7/2.7 Thermo CO/CI = 4.7/2.2 PVR = 0.8 WU Ao sat = 94% PA sat = 54%, 57% PAPI = 4.0  Assessment: 1. Elevated filling pressures with moderately decreased cardiac output (suspect thermodilution numbers are more accurate)  Plan/Discussion:  Continue diuresis. Continue w/u for advanced therapies  Arvilla Meres, MD 3:56 PM     ECHOCARDIOGRAM  ECHOCARDIOGRAM COMPLETE 12/25/2022  Narrative ECHOCARDIOGRAM REPORT    Patient Name:   Keith Jenkins Date of Exam: 12/25/2022 Medical Rec #:  [id number]    Height:       73.0 in Accession #:    [id number]   Weight:       194.6 lb Date of Birth:  11/22/1945    BSA:          2.127 m Patient Age:    77 years     BP:           124/86 mmHg Patient Gender: M            HR:           68 bpm. Exam Location:  Outpatient  Procedure: 2D Echo, Cardiac Doppler and Color Doppler  Indications:    Cardiiomyopathy  History:        Patient has prior history of Echocardiogram examinations, most recent 06/12/2021. Cardiomyopathy and CHF, Previous  Myocardial Infarction and CAD; Mitral Valve Disease.  Mitral Valve: MITRAL MITRIS RESILIA 27 valve is present valve is present in the mitral position. Procedure Date: 11/01/21.  Sonographer:    Darlys Gales Referring Phys: Fritzi Mandes [facility]  IMPRESSIONS   1. Global akinesis with akinesis of the inferolateral wall; overall, severe LV dysfunction. Severe, low flow/low gradient AS (DI 0.23, mean gradient 17 mmHg; AVA 0.7  cm2). 2. Left ventricular ejection fraction, by estimation, is 20 to 25%. The left ventricle has severely decreased function. The left ventricle demonstrates regional wall motion abnormalities (see scoring diagram/findings for description). The left ventricular internal cavity size was severely dilated. Left ventricular diastolic function could not be evaluated. 3. Right ventricular systolic function is mildly reduced. The right ventricular size is moderately enlarged. 4. Left atrial size was severely dilated. 5. Right atrial size was severely dilated. 6. The mitral valve has been repaired/replaced. Trivial mitral valve regurgitation. No evidence of mitral stenosis. There is a MITRAL MITRIS RESILIA 27 valve is present present in the mitral position. Procedure Date: 11/01/21. 7. Tricuspid valve regurgitation is moderate. 8. The aortic valve is normal in structure. Aortic valve regurgitation is mild. No aortic stenosis is present. 9. Aortic dilatation noted. There is borderline dilatation of the aortic root, measuring 38 mm.  FINDINGS Left Ventricle: Left ventricular ejection fraction, by estimation, is 20 to 25%. The left ventricle has severely decreased function. The left ventricle demonstrates regional wall motion abnormalities. The left ventricular internal cavity size was severely dilated. There is no left ventricular hypertrophy. Left ventricular diastolic function could not be evaluated due to mitral valve replacement. Left ventricular diastolic function could not be  evaluated.  Right Ventricle: The right ventricular size is moderately enlarged. Right ventricular systolic function is mildly reduced.  Left Atrium: Left atrial size was severely dilated.  Right Atrium: Right atrial size was severely dilated.  Pericardium: There is no evidence of pericardial effusion.  Mitral Valve: The mitral valve has been repaired/replaced. Trivial mitral valve regurgitation. There is a MITRAL MITRIS RESILIA 27 valve is present present in the mitral position. Procedure Date: 11/01/21. No evidence of mitral valve stenosis. MV peak gradient, 13.5 mmHg. The mean mitral valve gradient is 3.3 mmHg.  Tricuspid Valve: The tricuspid valve is normal in structure. Tricuspid valve regurgitation is moderate . No evidence of tricuspid stenosis.  Aortic Valve: The aortic valve is normal in structure. Aortic valve regurgitation is mild. Aortic regurgitation PHT measures 766 msec. No aortic stenosis is present. Aortic valve mean gradient measures 17.0 mmHg. Aortic valve peak gradient measures 24.6 mmHg. Aortic valve area, by VTI measures 0.71 cm.  Pulmonic Valve: The pulmonic valve was normal in structure. Pulmonic valve regurgitation is not visualized. No evidence of pulmonic stenosis.  Aorta: Aortic dilatation noted. There is borderline dilatation of the aortic root, measuring 38 mm.  IAS/Shunts: No atrial level shunt detected by color flow Doppler.  Additional Comments: Global akinesis with akinesis of the inferolateral wall; overall, severe LV dysfunction. Severe, low flow/low gradient AS (DI 0.23, mean gradient 17 mmHg; AVA 0.7 cm2).   LEFT VENTRICLE PLAX 2D LVIDd:         6.80 cm LVIDs:         5.90 cm LV PW:         0.80 cm LV IVS:        1.00 cm LVOT diam:     2.00 cm LV SV:         46 LV SV Index:   22 LVOT Area:     3.14 cm  LV Volumes (MOD) LV vol d, MOD A2C: 266.0 ml LV vol d, MOD A4C: 205.0 ml LV vol s, MOD A2C: 194.0 ml LV vol s, MOD A4C: 161.0 ml LV SV  MOD A2C:     72.0 ml LV SV MOD A4C:     205.0 ml LV SV MOD BP:  60.1 ml  RIGHT VENTRICLE RV Basal diam:  5.60 cm RV Mid diam:    5.20 cm RV S prime:     9.14 cm/s TAPSE (M-mode): 1.5 cm  LEFT ATRIUM              Index        RIGHT ATRIUM           Index LA Vol (A2C):   154.0 ml 72.41 ml/m  RA Area:     29.60 cm LA Vol (A4C):   53.8 ml  25.30 ml/m  RA Volume:   104.00 ml 48.90 ml/m LA Biplane Vol: 92.9 ml  43.68 ml/m AORTIC VALVE AV Area (Vmax):    0.73 cm AV Area (Vmean):   0.67 cm AV Area (VTI):     0.71 cm AV Vmax:           248.00 cm/s AV Vmean:          197.000 cm/s AV VTI:            0.646 m AV Peak Grad:      24.6 mmHg AV Mean Grad:      17.0 mmHg LVOT Vmax:         57.30 cm/s LVOT Vmean:        41.900 cm/s LVOT VTI:          0.147 m LVOT/AV VTI ratio: 0.23 AI PHT:            766 msec  AORTA Ao Root diam: 3.60 cm Ao Asc diam:  3.80 cm  MITRAL VALVE                TRICUSPID VALVE MV Area (PHT): 2.66 cm     TR Peak grad:   32.9 mmHg MV Area VTI:   0.81 cm     TR Vmax:        287.00 cm/s MV Peak grad:  13.5 mmHg MV Mean grad:  3.3 mmHg     SHUNTS MV Vmax:       1.84 m/s     Systemic VTI:  0.15 m MV Vmean:      78.1 cm/s    Systemic Diam: 2.00 cm MV Decel Time: 285 msec MV E velocity: 174.00 cm/s  Olga Millers MD Electronically signed by Olga Millers MD Signature Date/Time: 12/25/2022/12:00:55 PM    Final   TEE  ECHO INTRAOPERATIVE TEE 11/01/2021  Narrative *INTRAOPERATIVE TRANSESOPHAGEAL REPORT *    Patient Name:   ALEXYS ORRIS   Date of Exam: 11/01/2021 Medical Rec #:  [id number]      Height:       73.0 in Accession #:    [id number]     Weight:       205.5 lb Date of Birth:  07-26-1945      BSA:          2.18 m Patient Age:    76 years       BP:           98/75 mmHg Patient Gender: M              HR:           85 bpm. Exam Location:  Anesthesiology  Transesophogeal exam was perform intraoperatively during surgical  procedure. Patient was closely monitored under general anesthesia during the entirety of examination.  Indications:     Mitral Valve Disease, Coronary Artery Disease Sonographer:     Eulah Pont RDCS Performing Phys:  [id number] BRYAN K BARTLE Diagnosing Phys: Val Eagle MD  Complications: No known complications during this procedure. POST-OP IMPRESSIONS _ Left Ventricle: The left ventricle is unchanged from pre-bypass. _ Right Ventricle: The right ventricle appears unchanged from pre-bypass. _ Aortic Valve: The aortic valve appears unchanged from pre-bypass. The gradient recorded across the prosthetic valve is within the expected range. No perivalvular leak noted. _ Mitral Valve: A bioprosthetic bioprosthetic valve was placed, leaflets are not freely mobile and leaflets thin.  PRE-OP FINDINGS Left Ventricle: The left ventricle has moderate-severely reduced systolic function, with an ejection fraction of 30-35%. The cavity size was left ventricular size was not assessed.   LV Wall Scoring: The entire anterior wall, entire lateral wall, and apex are akinetic. The entire anterior septum is hypokinetic. The entire inferior wall, mid inferoseptal segment, and basal inferoseptal segment are normal.   Right Ventricle: The right ventricle has severely reduced systolic function. The cavity was dialated. There is no increase in right ventricular wall thickness. There global hypokinesis.  Left Atrium: No left atrial/left atrial appendage thrombus was detected.  Interatrial Septum: No atrial level shunt detected by color flow Doppler.  Pericardium: The pericardium was not assessed.  Mitral Valve: The mitral valve is dilated. There is mild mitral annular calcification present. Mitral valve regurgitation is severe by color flow Doppler. The MR jet is posteriorly-directed. Pulmonary venous flow is blunted (decreased). There is No evidence of mitral stenosis. There is moderate thickening  and moderate calcification present on the mitral valve anterior cusp with normal mobility and there is moderate thickening and moderate calcification present on the mitral valve posterior cusp with severely decreased mobility. Mild prolapse of the mitral valve A2 cusp was present. The posterior leaflet is tethered and displaced below annular plane to to LV dysfunction leading to relative prolapse of the A2 segment.  Tricuspid Valve: The tricuspid valve was dilated in appearance. Tricuspid valve regurgitation is moderate by color flow Doppler. The jet is directed centrally. No evidence of tricuspid stenosis is present. There is normal thickens and mild calcification present on the tricuspid valve anterior, posterior and septal cusps with normal mobility.  Aortic Valve: The aortic valve is tricuspid Aortic valve regurgitation is mild by color flow Doppler. The jet is centrally-directed. There is moderate stenosis of the aortic valve. Aortic stenosis by color doppler and planemetry however gradient not obtained as transgastric views unavailable given esophageal stricture/hiatal hernia.  Pulmonic Valve: The pulmonic valve was normal in structure, with normal. Pulmonic valve regurgitation was not assessed by color flow Doppler.   Aorta: There is mild dilatation of the ascending aorta, measuring 36 mm. 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  +--------------+--------++ LEFT VENTRICLE         +--------------+--------++ PLAX 2D                +--------------+--------++ LVOT diam:    2.35 cm  +--------------+--------++ LVOT Area:    4.34 cm +--------------+--------++                        +--------------+--------++   +--------------+-------++ AORTA                 +--------------+-------++ Ao Sinus diam:3.60 cm +--------------+-------++ Ao STJ diam:  3.2 cm  +--------------+-------++ Ao Asc diam:  3.20  cm +--------------+-------++  +-------------+---------++ MITRAL VALVE                +--------------+-------+ +-------------+---------++      SHUNTS  MV Peak grad:7.9 mmHg       +--------------+-------+ +-------------+---------++      Systemic Diam:2.35 cm MV Mean grad:2.0 mmHg       +--------------+-------+ +-------------+---------++ MV Vmax:     1.40 m/s  +-------------+---------++ MV Vmean:    58.6 cm/s +-------------+---------++ MV VTI:      0.28 m    +-------------+---------++ +---------------+-----------++ MR Peak grad:  74.0 mmHg   +---------------+-----------++ MR Mean grad:  44.0 mmHg   +---------------+-----------++ MR Vmax:       430.00 cm/s +---------------+-----------++ MR Vmean:      301.0 cm/s  +---------------+-----------++ MR PISA:       4.02 cm    +---------------+-----------++ MR PISA Radius:0.80 cm     +---------------+-----------++   Val Eagle MD Electronically signed by Val Eagle MD Signature Date/Time: 11/06/2021/5:02:55 PM    Final            Patient Profile     Mr. Wold is a 78yo M with a hx of CAD s/p 2V CABG (10/2021), chronic systolic CHF, ischemic cardiomyopathy, PAF, severe MR s/p mitral valve replacement, esophageal stricture, COPD, GERD, HLD, and severe aortic stenosis who presented to [facility]/16/[facility]23 with volume overload and AKI prior to pre TAVR cath. He was admitted to AHF team and treated with DBA/diuretics with improvement and is being seen today as follow up for TAVR workup at the request of Dr. Gala Romney.    Assessment & Plan    Severe aortic stenosis: Recently evaluated OP for potential TAVR 01/05/23 as a referral from Dr. Gala Romney. Plan was for [facility] and TAVR CT imaging however on presentation he was found to be volume overloaded with AKI on labs therefore procedure aborted and patient admitted for optimization. Despite aggressive efforts, appears  that cardiomyopathy is end-stage at this point therefore TAVR not felt to offer much improvement for long term trajectory with ongoing volume overload and renal failure. This has been discussed at length with the patient and family at bedside. Palliative care has been consulted for goals of care discussion. No plans to pursue pre TAVR scans as risks outweigh benefits.  Acute on chronic systolic heart failure: As above, patient being followed closely by AHF team and felt to have end-stage heart failure at this point. Patient not a candidate for advanced therapies and has been minimally responsive to DBA therefore plan to continue dobutamine with IV lasix and palliative care consult. Management per AHF  AKI with underlying CKD stage IV: Cr at 2.43 today with prior baseline earlier this year in the 1.1-1.2 range. Not felt to be a candidate for HD. Continue to monitor response to diuretics closely.   CAD: s/p 2V CABG with LIMA to LAD and SVG to Ramus 10/2021. Denies anginal symptoms. Continue ASA, statin  Mitral regurgitation: s/p MVR 10/2021 with MAZE. On chronic warfarin however being treated with IV Heparin during hospital admission.   PAF: s/p MAZE with CABG/MVR/LAA clipping. Maintaining NSR. Continue Heparin   Hyponatremia: Na+ 120>>up from 117 yesterday afternoon. Treated with Tolvaptan and minimizing free water.    Signed, Georgie Chard, NP  01/23/2023, 8:53 AM  Pager [phone]   I have personally seen and examined this patient. I agree with the assessment and plan as outlined above.  I have reviewed his current clinical status. Full chart review including all notes by the Advanced Heart Failure team. I agree that TAVR is not an option at this point as this would likely not improve his clinical outcome even if he  were to survive the TAVR procedure. I have reviewed this plan with the patient and his son at the bedside. The structural team will follow from a distance. Agree with palliative  approach.   Verne Carrow, MD, [facility] 01/23/2023 9:40 AM

## 2023-01-22 NOTE — Progress Notes (Addendum)
Mobility Specialist Progress Note:   01/22/23 1257  Mobility  Activity Ambulated with assistance in hallway  Level of Assistance Contact guard assist, steadying assist  Assistive Device Front wheel walker  Distance Ambulated (ft) 130 ft  Activity Response Tolerated well  Mobility Referral Yes  $Mobility charge 1 Mobility  Mobility Specialist Start Time (ACUTE ONLY) 1201  Mobility Specialist Stop Time (ACUTE ONLY) 1251  Mobility Specialist Time Calculation (min) (ACUTE ONLY) 50 min    Pre Mobility: 56 HR , 79% SpO2 RA During Mobility: 62 HR , 93% SpO2 1 L  Post Mobility: 59 HR , 99% SpO2 RA  Pt received in bed, agreeable to mobility. Prior to standing pt c/o SOB. Connected pt to SpO2 monitor it read 79%. Pursed lip breathing encouraged. Pt placed on 1 L of O2. SpO2 quickly rose above >89%. During ambulation standing rest break required d/t fatigue and slight lightheadedness. Pt able to ambulate back to room and left in chair asymptomatic with call bell in hand and all needs met. Family present.  Leory Plowman  Mobility Specialist Please contact via [employer] office at [phone]

## 2023-01-22 NOTE — Progress Notes (Signed)
Remains significantly volume overloaded. Suboptimal diuresis with IV lasix and dobutamine.   Concerned that TAVR may not be enough to improve his condition. Also has end-stage heart failure and CKD IV.   Will increase IV lasix to 160 mg BID.   Discussed with structural heart team who will evaluate patient again this admission.   Will also consult Palliative Care to initiate goals of care discussion.

## 2023-01-22 NOTE — Progress Notes (Signed)
ANTICOAGULATION CONSULT NOTE - Follow Up Consult  Pharmacy Consult for heparin Indication: atrial fibrillation  No Known Allergies  Patient Measurements: Height: 6\' 1"  (185.4 cm) Weight: 90.1 kg (198 lb 10.2 oz) IBW/kg (Calculated) : 79.9 HEPARIN DW (KG): 84.8   Vital Signs: Temp: 97.8 F (36.6 C) (08/15 0800) Temp Source: Oral (08/15 0800) BP: 97/61 (08/15 0800) Pulse Rate: 61 (08/15 0514)  Labs: Recent Labs    01/20/23 0529 01/20/23 1355 01/21/23 0212 01/21/23 0940 01/22/23 0518  HGB 10.1*  --  9.8*  --  9.4*  HCT 28.6*  --  28.2*  --  26.5*  PLT 108*  --  103*  --  94*  HEPARINUNFRC  --  0.15*  --  0.13* 0.23*  CREATININE 2.29*  --  2.19*  --  2.41*    Estimated Creatinine Clearance: 29.9 mL/min (A) (by C-G formula based on SCr of 2.41 mg/dL (H)).   Medical History: Past Medical History:  Diagnosis Date   Aortic atherosclerosis (HCC)    Arthritis    CHF (congestive heart failure) (HCC)    Esophageal stricture    Glaucoma    MI (myocardial infarction) (HCC)    Non-ST elevation (NSTEMI) myocardial infarction (HCC) 10/10/2021   NSTEMI (non-ST elevated myocardial infarction) (HCC) 10/10/2021   Osteoarthritis    Severe mitral regurgitation    Stricture and stenosis of esophagus     Medications:  Medications Prior to Admission  Medication Sig Dispense Refill Last Dose   acetaminophen (TYLENOL) 500 MG tablet Take 250 mg by mouth every 6 (six) hours as needed for moderate pain.   01/15/2023   albuterol (PROVENTIL) (2.5 MG/3ML) 0.083% nebulizer solution Inhale 3 mLs into the lungs every 6 (six) hours as needed for wheezing or shortness of breath. 90 mL 12 01/15/2023   albuterol (VENTOLIN HFA) 108 (90 Base) MCG/ACT inhaler Inhale 2 puffs into the lungs every 6 (six) hours as needed for wheezing or shortness of breath. 8 g 0 01/15/2023   amiodarone (PACERONE) 100 MG tablet TAKE ONE TABLET BY MOUTH ONCE DAILY 90 tablet 3 01/16/2023 at 0500   aspirin EC (ASPIRIN LOW DOSE)  81 MG tablet TAKE ONE TABLET BY MOUTH ONCE DAILY 90 tablet 3 01/16/2023 at 0500   atorvastatin (LIPITOR) 80 MG tablet TAKE ONE TABLET BY MOUTH EVERYDAY AT BEDTIME (Patient taking differently: Take 80 mg by mouth daily.) 90 tablet 3 01/16/2023 at 0500   cyanocobalamin (VITAMIN B12) 1000 MCG/ML injection Inject 1 mL (1,000 mcg total) into the muscle every 30 (thirty) days. 1 mL 3 Past Month   dapagliflozin propanediol (FARXIGA) 10 MG TABS tablet Take 1 tablet (10 mg total) by mouth daily. 90 tablet 1 01/15/2023   esomeprazole (NEXIUM) 40 MG capsule Take 40 mg by mouth daily.   01/15/2023   ezetimibe (ZETIA) 10 MG tablet Take 0.5 tablets (5 mg total) by mouth daily. 90 tablet 1 01/15/2023   Furosemide (FUROSCIX [place]) Inject into the skin.   01/13/2023   Iron, Ferrous Sulfate, 325 (65 Fe) MG TABS Take 325 mg by mouth daily. 30 tablet 1 01/15/2023   latanoprost (XALATAN) 0.005 % ophthalmic solution Place 1 drop into both eyes at bedtime.   01/15/2023   metolazone (ZAROXOLYN) 2.5 MG tablet Take 1 tablet (2.5 mg total) by mouth daily as needed. 30 tablet 0 Past Month   midodrine (PROAMATINE) 10 MG tablet Take 1 tablet (10 mg total) by mouth 3 (three) times daily. 270 tablet 3 01/16/2023 at 0500  OVER THE COUNTER MEDICATION Apply 1 application  topically at bedtime. Medication: Mag Maxx Cream Back and restless leg   Past Week   potassium chloride SA (KLOR-CON M) 20 MEQ tablet TAKE TWO TABLETS BY MOUTH ONCE DAILY 60 tablet 6 01/15/2023 at 0800   spironolactone (ALDACTONE) 25 MG tablet TAKE ONE TABLET BY MOUTH ONCE DAILY 90 tablet 3 01/15/2023   torsemide (DEMADEX) 20 MG tablet Take 4 tablets (80 mg total) by mouth daily. (Patient taking differently: Take 40-80 mg by mouth See admin instructions. Takes 80 mg in the morning and 40 mg at night for 3 days.) 120 tablet 5 01/15/2023   Fluticasone-Umeclidin-Vilant (TRELEGY ELLIPTA) 100-62.5-25 MCG/ACT AEPB Inhale 1 puff into the lungs daily. (Patient taking differently: Inhale 1 puff into  the lungs daily as needed (wheezing).) 1 each 11 More than a month   nitroGLYCERIN (NITROSTAT) 0.4 MG SL tablet Place 1 tablet (0.4 mg total) under the tongue every 5 (five) minutes as needed for chest pain. 25 tablet 3 More than a month   warfarin (COUMADIN) 1 MG tablet Take 2-3 tablets BY MOUTH daily AS DIRECTED by coumadin clinic (Patient taking differently: Take 3 mg by mouth daily.) 200 tablet 0 01/10/2023 at 0800   Scheduled:   amiodarone  100 mg Oral Daily   aspirin EC  81 mg Oral Daily   atorvastatin  80 mg Oral Daily   Chlorhexidine Gluconate Cloth  6 each Topical Daily   escitalopram  10 mg Oral Daily   ezetimibe  5 mg Oral Daily   Fe Fum-Vit C-Vit B12-FA  1 capsule Oral QPC breakfast   fluticasone furoate-vilanterol  1 puff Inhalation Daily   And   umeclidinium bromide  1 puff Inhalation Daily   furosemide  80 mg Intravenous BID   gabapentin  600 mg Oral QHS   latanoprost  1 drop Both Eyes QHS   melatonin  3 mg Oral QHS   midodrine  10 mg Oral TID   pantoprazole  40 mg Oral Daily   tolvaptan  15 mg Oral Once    Assessment: 77 yo male with acute chronic systolic HF on warfarin PTA. Oral anticoagulation to be on hold for cardiac cath. Pharmacy consulted to dose heparin.   INR previously 1.5 (on hold PTA for planned heart cath). Home warfarin regimen: 3mg /day (last dose taken 8/3).  S/p cath 8/12 Heparin restarted 8/13 at reduced rate as pt with some oozing from IV lines MD held heparin x 6 hours - will restart 8/14 0100 on drip rate 1100 uts/hr with heparin level low 0.23 oozing improving  -hgb stable 9 pltc low stable 90-100   Goal of Therapy:  Heparin level 0.3-0.7 units/ml Monitor platelets by anticoagulation protocol: Yes   Plan:  Increase Heparin 1200 units/hr  Daily heparin level and cbc  F/u closely for further bleeding    Leota Sauers Pharm.D. CPP, BCPS Clinical Pharmacist [phone] 01/22/2023 10:26 AM

## 2023-01-22 NOTE — Plan of Care (Signed)
  Problem: Education: Goal: Understanding of CV disease, CV risk reduction, and recovery process will improve Outcome: Progressing   Problem: Activity: Goal: Ability to return to baseline activity level will improve Outcome: Progressing   Problem: Health Behavior/Discharge Planning: Goal: Ability to safely manage health-related needs after discharge will improve Outcome: Progressing   Problem: Education: Goal: Knowledge of General Education information will improve Description: Including pain rating scale, medication(s)/side effects and non-pharmacologic comfort measures Outcome: Progressing   Problem: Health Behavior/Discharge Planning: Goal: Ability to manage health-related needs will improve Outcome: Progressing

## 2023-01-22 NOTE — Progress Notes (Addendum)
Advanced Heart Failure Rounding Note  PCP-Cardiologist: None   Subjective:   Admitted with volume overload and AKI.  01/18/23 Started on DBA 2.5 mcg. Diuretics held.   RHC 8/12 on DBA 2.5: RA 17, PA 58/28 (41), Fick CO/CI 5.9/2.7, TD CO/CI 4.6/2.2, PVR 3.1, PAPi 1.8. Biventricular HF with moderately depressed CO and elevated filling pressures despite DBA support   8/14 - Got 120 mg IV lasix. Negative > 1 liter.   Remains on DBA 2.5. Co-ox better today at 58%  SCr trending down 3.2 -> 2.6 -> 2.4-> 2.97->2.77->2.29->2.19-->2.4 . Sodium 122 . Got tolvpatan 8/14  Frustrated he can't the fluid off.   Objective:   Weight Range: 90.1 kg Body mass index is 26.21 kg/m.   Vital Signs:   Temp:  [97.6 F (36.4 C)-97.8 F (36.6 C)] 97.8 F (36.6 C) (08/15 0800) Pulse Rate:  [55-61] 61 (08/15 0514) Resp:  [12-20] 17 (08/15 0800) BP: (81-105)/(44-62) 97/61 (08/15 0800) SpO2:  [95 %-99 %] 97 % (08/15 0859) Weight:  [90.1 kg] 90.1 kg (08/15 0514) Last BM Date : 02/02/2023  Weight change: Filed Weights   01/20/23 0321 01/21/23 0227 01/22/23 0514  Weight: 88.8 kg 89.8 kg 90.1 kg    Intake/Output:   Intake/Output Summary (Last 24 hours) at 01/22/2023 0956 Last data filed at 01/22/2023 0900 Gross per 24 hour  Intake 960 ml  Output 1200 ml  Net -240 ml    CVP 20 personallyl chekced.   Physical Exam   General:   No resp difficulty HEENT: normal Neck: supple. JVP to jaw  Carotids 2+ bilat; no bruits. No lymphadenopathy or thryomegaly appreciated. RIJ  Cor: PMI nondisplaced. Regular rate & rhythm. No rubs, gallops or murmurs. Lungs: clear Abdomen: soft, nontender, distended. No hepatosplenomegaly. No bruits or masses. Good bowel sounds. Extremities: no cyanosis, clubbing, rash, R ands LLE 1+ edema Neuro: alert & orientedx3, cranial nerves grossly intact. moves all 4 extremities w/o difficulty. Affect pleasant  Telemetry  SB/SR   EKG    No new EKG to review  Labs     CBC Recent Labs    01/21/23 0212 01/22/23 0518  WBC 6.5 6.3  HGB 9.8* 9.4*  HCT 28.2* 26.5*  MCV 88.7 90.1  PLT 103* 94*   Basic Metabolic Panel Recent Labs    57/84/69 0212 01/22/23 0518  NA 120* 122*  K 3.9 4.4  CL 89* 89*  CO2 21* 21*  GLUCOSE 160* 104*  BUN 68* 73*  CREATININE 2.19* 2.41*  CALCIUM 8.5* 8.4*  MG 2.7* 2.8*   Liver Function Tests No results for input(s): "AST", "ALT", "ALKPHOS", "BILITOT", "PROT", "ALBUMIN" in the last 72 hours.  No results for input(s): "LIPASE", "AMYLASE" in the last 72 hours. Cardiac Enzymes No results for input(s): "CKTOTAL", "CKMB", "CKMBINDEX", "TROPONINI" in the last 72 hours.  BNP: BNP (last 3 results) Recent Labs    01/01/23 1430 01/03/23 0910 01/07/23 1923  BNP 1,924.8* 1,458.7* 2,208.6*    ProBNP (last 3 results) Recent Labs    01/12/23 1036  PROBNP 9,272*   D-Dimer No results for input(s): "DDIMER" in the last 72 hours. Hemoglobin A1C No results for input(s): "HGBA1C" in the last 72 hours. Fasting Lipid Panel No results for input(s): "CHOL", "HDL", "LDLCALC", "TRIG", "CHOLHDL", "LDLDIRECT" in the last 72 hours. Thyroid Function Tests No results for input(s): "TSH", "T4TOTAL", "T3FREE", "THYROIDAB" in the last 72 hours.  Invalid input(s): "FREET3"  Other results:  Imaging  No results found.  Medications:  Scheduled Medications:  amiodarone  100 mg Oral Daily   aspirin EC  81 mg Oral Daily   atorvastatin  80 mg Oral Daily   Chlorhexidine Gluconate Cloth  6 each Topical Daily   escitalopram  10 mg Oral Daily   ezetimibe  5 mg Oral Daily   Fe Fum-Vit C-Vit B12-FA  1 capsule Oral QPC breakfast   fluticasone furoate-vilanterol  1 puff Inhalation Daily   And   umeclidinium bromide  1 puff Inhalation Daily   gabapentin  600 mg Oral QHS   latanoprost  1 drop Both Eyes QHS   melatonin  3 mg Oral QHS   midodrine  10 mg Oral TID   pantoprazole  40 mg Oral Daily    Infusions:  DOBUTamine 2.5  mcg/kg/min (01/21/23 1223)   heparin 1,100 Units/hr (01/21/23 1541)    PRN Medications: acetaminophen, albuterol, nitroGLYCERIN, ondansetron (ZOFRAN) IV Assessment/Plan  Acute on chronic systolic heart failure - Echo 13/2/44 showed EF 20-25%, severe LV dysfunction with global HK, + large Left pleural effusion, RV moderately down, moderate aortic stenosis, suspect component of low gradient AS. - RHC (10/23): Elevated filling pressures with moderately decreased cardiac output. RA 8, PA 48/16, PCWP 22, CO/CI (Fick) 5.7/2.7. - Echo 7/24: EF 20-25%, RV mildly reduced, RA/LA severely dilated, trivial MR, no AV regurgitation - RHC 8/12 on DBA 2.5: RA 17, PA 58/28 (41), Fick CO/CI 5.9/2.7, TD CO/CI 4.6/2.2, PVR 3.1, PAPi 1.8. Biventricular HF with moderately depressed CO and elevated filling pressures despite DBA support  - Felt not to be a suitable VAD candidate (Drs. Gala Romney & Bartle discussed). Patient declined VAD as well. -  NYHA IV. Volume overloaded on admission. -  MAPs remain borderline in setting of AS despite midodrine and DBA - Continue DBA 2.5. 8/12 diuretics held. Renal function + CVP trending up. Sodium 122.  -CVP up to 20. Give 80 mg IV lasix x 2  and dose of tolvaptan.  - GDMT on hold with AKI and hypotension - Continue midodrine 10 mg tid. - Continue Unna boots - He has end-stage HF and severe AS now with volume overload and AKI. Admitted for diuresis and possible inotropic support. Worried that the main issue may be his cardiomyopathy and that TAVR might not be enough to improve his condition. Continue aggressive care for now -We discussed possible need for Palliative Care. Discussed again he is likely end stage.   Severe aortic stenosis, LVLG AS - Moderate on echo 10/23  - ?? Seen on 7/24 echo - Admitted for TAVR eval, needs volume optimization first.  - LHC heart cath pending renal function improvement. RHC as above.  Structural Heart Team coming to see.    AKI on CKD  stage 4 - SCr 3.2, baseline 1.8-2.1 - SCr--> 2.6 -> 2.4- >3>2.7>2.29>2.19>>2.4  today - Avoid hypotension    CAD s/p late presentation inferior STEMI - LHC 05/23: 100% m RCA and 90% m LAD.  - RCA managed medically d/t completed infarct.  - Echo (5/23) EF 25%, RV moderately reduced, moderate to severe MR - Echo (10/23): EF 20-25%, RV moderately down. - s/p CABG x 2 (LIMA-LAD, SVG-Ramus)  (11/01/21) - No s/s angina - Continue ASA + high intensity statin   Mitral regurgitation - Moderate to severe ischemic MR on TEE.  - S/p bioprosthetic MVR 11/02/21. - Warfarin on hold, cont heparin drip   Hyponatremia - Na 122 today. Given 15 mg tolvaptan 8/13.  -Give another dose of tolvaptan today.  -  Limit free water.     H/O pericardial effusion/pericarditis - Moderate in size on past echo, w/o tamponade. - Acute pericarditis/ adhensions noted at time of CABG. - Resolved on echo 10/23 and not seen on echo 7/24   PAF - s/p MAZE w/ CABG  - Continue amiodarone 100 mg daily.  - Remains in NSR. Cont heparin drip   Dr Gala Romney discussed with Structural Team for input regarding TAVR.  Suspect we need to involve Palliative Care go GOC.   Length of Stay: 6  Amy Clegg, NP  01/22/2023, 9:56 AM  Advanced Heart Failure Team Pager [phone] (M-F; 7a - 5p)  Please contact [facility] for night-coverage after hours (5p -7a ) and weekends on [email or web address]  Patient seen and examined with the above-signed Advanced Practice Provider and/or Housestaff. I personally reviewed laboratory data, imaging studies and relevant notes. I independently examined the patient and formulated the important aspects of the plan. I have edited the note to reflect any of my changes or salient points. I have personally discussed the plan with the patient and/or family.  He remains on DBA. Co-ox ox but continues to struggle with volume overload and worsening renal function. Progressive hyponatremia.   Very weak. Denies SOB,  orthopnea or PND.   General:  Weak appearing. No resp difficulty HEENT: normal Neck: supple.JVP to ear. + RIJ TLC  Carotids 2+ bilat; no bruits. No lymphadenopathy or thryomegaly appreciated. Cor: Regular rate & rhythm. 2/6 AS Lungs: clear Abdomen: soft, nontender, nondistended. No hepatosplenomegaly. No bruits or masses. Good bowel sounds. Extremities: no cyanosis, clubbing, rash, 2-3+ edema into thighs Neuro: alert & orientedx3, cranial nerves grossly intact. moves all 4 extremities w/o difficulty. Affect pleasant  He continues to struggle with severe HF and renal failure despite DBA support. I suspect he is end-stage. Given severity of renal failure and overall debility, I think the risk of TAVR outweighs any potential benefit. We also discussed the fact that he would not be a candidate for dialysis.   At this point, we will have Renal and Structural teams weigh in formally. I have discussed with him and his family that Palliative/Hospice Care may be best option at this point. Will consult Palliative team.   For now, continue DBA. Increases lasix to 160 IV bid.   Arvilla Meres, MD  5:17 PM

## 2023-01-23 ENCOUNTER — Encounter (HOSPITAL_COMMUNITY): Payer: Medicare HMO

## 2023-01-23 DIAGNOSIS — Z7189 Other specified counseling: Secondary | ICD-10-CM

## 2023-01-23 DIAGNOSIS — Z711 Person with feared health complaint in whom no diagnosis is made: Secondary | ICD-10-CM

## 2023-01-23 DIAGNOSIS — Z515 Encounter for palliative care: Secondary | ICD-10-CM | POA: Diagnosis not present

## 2023-01-23 DIAGNOSIS — I480 Paroxysmal atrial fibrillation: Secondary | ICD-10-CM | POA: Diagnosis not present

## 2023-01-23 DIAGNOSIS — I350 Nonrheumatic aortic (valve) stenosis: Secondary | ICD-10-CM

## 2023-01-23 DIAGNOSIS — I5023 Acute on chronic systolic (congestive) heart failure: Secondary | ICD-10-CM | POA: Diagnosis not present

## 2023-01-23 DIAGNOSIS — Z66 Do not resuscitate: Secondary | ICD-10-CM

## 2023-01-23 DIAGNOSIS — Z789 Other specified health status: Secondary | ICD-10-CM

## 2023-01-23 LAB — HEPATIC FUNCTION PANEL
ALT: 25 U/L (ref 0–44)
AST: 38 U/L (ref 15–41)
Albumin: 2.9 g/dL — ABNORMAL LOW (ref 3.5–5.0)
Alkaline Phosphatase: 75 U/L (ref 38–126)
Bilirubin, Direct: 0.7 mg/dL — ABNORMAL HIGH (ref 0.0–0.2)
Indirect Bilirubin: 1.4 mg/dL — ABNORMAL HIGH (ref 0.3–0.9)
Total Bilirubin: 2.1 mg/dL — ABNORMAL HIGH (ref 0.3–1.2)
Total Protein: 6.3 g/dL — ABNORMAL LOW (ref 6.5–8.1)

## 2023-01-23 LAB — BASIC METABOLIC PANEL
Anion gap: 11 (ref 5–15)
Anion gap: 11 (ref 5–15)
BUN: 80 mg/dL — ABNORMAL HIGH (ref 8–23)
BUN: 79 mg/dL — ABNORMAL HIGH (ref 8–23)
CO2: 19 mmol/L — ABNORMAL LOW (ref 22–32)
CO2: 19 mmol/L — ABNORMAL LOW (ref 22–32)
Calcium: 8.1 mg/dL — ABNORMAL LOW (ref 8.9–10.3)
Calcium: 7.9 mg/dL — ABNORMAL LOW (ref 8.9–10.3)
Chloride: 90 mmol/L — ABNORMAL LOW (ref 98–111)
Chloride: 88 mmol/L — ABNORMAL LOW (ref 98–111)
Creatinine, Ser: 2.43 mg/dL — ABNORMAL HIGH (ref 0.61–1.24)
Creatinine, Ser: 2.2 mg/dL — ABNORMAL HIGH (ref 0.61–1.24)
GFR, Estimated: 27 mL/min — ABNORMAL LOW (ref >60)
GFR, Estimated: 30 mL/min — ABNORMAL LOW (ref >60)
Glucose, Bld: 104 mg/dL — ABNORMAL HIGH (ref 70–99)
Glucose, Bld: 102 mg/dL — ABNORMAL HIGH (ref 70–99)
Potassium: 4.4 mmol/L (ref 3.5–5.1)
Potassium: 4.5 mmol/L (ref 3.5–5.1)
Sodium: 120 mmol/L — ABNORMAL LOW (ref 135–145)
Sodium: 118 mmol/L — CL (ref 135–145)

## 2023-01-23 LAB — CBC
HCT: 24.8 % — ABNORMAL LOW (ref 39.0–52.0)
Hemoglobin: 8.7 g/dL — ABNORMAL LOW (ref 13.0–17.0)
MCH: 31.5 pg (ref 26.0–34.0)
MCHC: 35.1 g/dL (ref 30.0–36.0)
MCV: 89.9 fL (ref 80.0–100.0)
Platelets: 93 10*3/uL — ABNORMAL LOW (ref 150–400)
RBC: 2.76 MIL/uL — ABNORMAL LOW (ref 4.22–5.81)
RDW: 16.9 % — ABNORMAL HIGH (ref 11.5–15.5)
WBC: 6.9 10*3/uL (ref 4.0–10.5)
nRBC: 0 % (ref 0.0–0.2)

## 2023-01-23 LAB — COOXEMETRY PANEL
Carboxyhemoglobin: 1.4 % (ref 0.5–1.5)
Carboxyhemoglobin: 2.9 % — ABNORMAL HIGH (ref 0.5–1.5)
Methemoglobin: <0.7 % (ref 0.0–1.5)
Methemoglobin: 0.8 % (ref 0.0–1.5)
O2 Saturation: 49.3 %
O2 Saturation: 47.1 %
Total hemoglobin: 9.5 g/dL — ABNORMAL LOW (ref 12.0–16.0)
Total hemoglobin: <6.9 g/dL — CL (ref 12.0–16.0)

## 2023-01-23 LAB — MAGNESIUM: Magnesium: 2.7 mg/dL — ABNORMAL HIGH (ref 1.7–2.4)

## 2023-01-23 LAB — SODIUM: Sodium: 110 mmol/L — CL (ref 135–145)

## 2023-01-23 LAB — HEPARIN LEVEL (UNFRACTIONATED): Heparin Unfractionated: 0.5 [IU]/mL (ref 0.30–0.70)

## 2023-01-23 MED ORDER — TOLVAPTAN 15 MG PO TABS: 30.0000 mg | ORAL_TABLET | Freq: Once | ORAL | Status: DC

## 2023-01-23 MED ORDER — TOLVAPTAN 15 MG PO TABS
30.0000 mg | ORAL_TABLET | Freq: Once | ORAL | Status: AC
  Administered 2023-01-23: 30 mg via ORAL
  Filled 2023-01-23: qty 2

## 2023-01-23 MED ORDER — ORAL CARE MOUTH RINSE: 15.0000 mL | OROMUCOSAL | Status: DC | PRN

## 2023-01-23 MED ORDER — TOLVAPTAN 15 MG PO TABS
15.0000 mg | ORAL_TABLET | Freq: Once | ORAL | Status: DC
  Filled 2023-01-23: qty 1

## 2023-01-23 NOTE — Progress Notes (Signed)
Patient fell while ambulating in the hallway with mobility. Patient landed on his right side and hit his head. Staff assisted patient up and to the recliner, no evidence of harm noted. Family at bedside and was notified immediately. HF paged and notified.    Sherilyn Banker, RN

## 2023-01-23 NOTE — Progress Notes (Signed)
Orthopedic Tech Progress Note Patient Details:  Keith Jenkins 07/12/1945 [id number]  Ortho Devices Type of Ortho Device: Radio broadcast assistant Ortho Device/Splint Location: BLE Ortho Device/Splint Interventions: Ordered, Application, Adjustment   Post Interventions Patient Tolerated: Well Instructions Provided: Care of device, Adjustment of device  [place] A Gerilyn Pilgrim 01/23/2023, 3:34 PM
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
## 2023-01-23 NOTE — Progress Notes (Addendum)
Advanced Heart Failure Rounding Note  PCP-Cardiologist: None   Subjective:   Admitted with volume overload and AKI.  01/18/23 Started on DBA 2.5 mcg. Diuretics held.   RHC 8/12 on DBA 2.5: RA 17, PA 58/28 (41), Fick CO/CI 5.9/2.7, TD CO/CI 4.6/2.2, PVR 3.1, PAPi 1.8. Biventricular HF with moderately depressed CO and elevated filling pressures despite DBA support   8/14 - Got 120 mg IV lasix. Negative > 1 liter.  8/15- Given Tolvaptan. IV lasix increased to 160 mg bid.  Sluggish diuresis. + 465  Remains on DBA 2.5. Co-ox 50%   SCr trending down 3.2 -> 2.6 -> 2.4-> 2.97->2.77->2.29->2.19-->2.4 ->2.43 . Sodium 120, today   Feels ok. Worried he's not getting better.   Objective:   Weight Range: 92.2 kg Body mass index is 26.82 kg/m.   Vital Signs:   Temp:  [97.8 F (36.6 C)-97.9 F (36.6 C)] 97.9 F (36.6 C) (08/16 0445) Pulse Rate:  [58] 58 (08/15 1645) Resp:  [17-20] 19 (08/16 0445) BP: (93-102)/(55-65) 97/55 (08/16 0445) SpO2:  [97 %] 97 % (08/15 1645) Weight:  [92.2 kg] 92.2 kg (08/16 0445) Last BM Date : 01/22/2023  Weight change: Filed Weights   01/21/23 0227 01/22/23 0514 01/23/23 0445  Weight: 89.8 kg 90.1 kg 92.2 kg    Intake/Output:   Intake/Output Summary (Last 24 hours) at 01/23/2023 0709 Last data filed at 01/23/2023 0446 Gross per 24 hour  Intake 1565.89 ml  Output 1100 ml  Net 465.89 ml    CVP 21-22 Physical Exam   General:  No resp difficulty HEENT: normal Neck: supple. no to jaw. Carotids 2+ bilat; no bruits. No lymphadenopathy or thryomegaly appreciated. RIJ Cor: PMI nondisplaced. Regular rate & rhythm. No rubs, gallops . RUSB 2/6 murmurs. Lungs: clear Abdomen: soft, nontender, nondistended. No hepatosplenomegaly. No bruits or masses. Good bowel sounds. Extremities: no cyanosis, clubbing, rash, R and LLE unna boots.  Neuro: alert & orientedx3, cranial nerves grossly intact. moves all 4 extremities w/o difficulty. Affect  pleasant  Telemetry  SB  EKG    No new EKG to review  Labs    CBC Recent Labs    01/22/23 0518 01/23/23 0400  WBC 6.3 6.9  HGB 9.4* 8.7*  HCT 26.5* 24.8*  MCV 90.1 89.9  PLT 94* 93*   Basic Metabolic Panel Recent Labs    16/10/96 0518 01/22/23 2015 01/23/23 0400  NA 122* 117* 120*  K 4.4  --  4.4  CL 89*  --  90*  CO2 21*  --  19*  GLUCOSE 104*  --  104*  BUN 73*  --  80*  CREATININE 2.41*  --  2.43*  CALCIUM 8.4*  --  8.1*  MG 2.8*  --  2.7*   Liver Function Tests No results for input(s): "AST", "ALT", "ALKPHOS", "BILITOT", "PROT", "ALBUMIN" in the last 72 hours.  No results for input(s): "LIPASE", "AMYLASE" in the last 72 hours. Cardiac Enzymes No results for input(s): "CKTOTAL", "CKMB", "CKMBINDEX", "TROPONINI" in the last 72 hours.  BNP: BNP (last 3 results) Recent Labs    01/01/23 1430 01/03/23 0910 01/07/23 1923  BNP 1,924.8* 1,458.7* 2,208.6*    ProBNP (last 3 results) Recent Labs    01/12/23 1036  PROBNP 9,272*   D-Dimer No results for input(s): "DDIMER" in the last 72 hours. Hemoglobin A1C No results for input(s): "HGBA1C" in the last 72 hours. Fasting Lipid Panel No results for input(s): "CHOL", "HDL", "LDLCALC", "TRIG", "CHOLHDL", "LDLDIRECT" in the last  72 hours. Thyroid Function Tests No results for input(s): "TSH", "T4TOTAL", "T3FREE", "THYROIDAB" in the last 72 hours.  Invalid input(s): "FREET3"  Other results:  Imaging  No results found.  Medications:   Scheduled Medications:  amiodarone  100 mg Oral Daily   aspirin EC  81 mg Oral Daily   atorvastatin  80 mg Oral Daily   Chlorhexidine Gluconate Cloth  6 each Topical Daily   escitalopram  10 mg Oral Daily   ezetimibe  5 mg Oral Daily   Fe Fum-Vit C-Vit B12-FA  1 capsule Oral QPC breakfast   fluticasone furoate-vilanterol  1 puff Inhalation Daily   And   umeclidinium bromide  1 puff Inhalation Daily   gabapentin  600 mg Oral QHS   latanoprost  1 drop Both Eyes QHS    melatonin  3 mg Oral QHS   midodrine  10 mg Oral TID   pantoprazole  40 mg Oral Daily    Infusions:  DOBUTamine 2.5 mcg/kg/min (01/22/23 1828)   furosemide 66 mL/hr at 01/22/23 1828   heparin 1,200 Units/hr (01/22/23 1828)    PRN Medications: acetaminophen, albuterol, nitroGLYCERIN, ondansetron (ZOFRAN) IV, phenylephrine Assessment/Plan  Acute on chronic systolic heart failure - Echo 04/17/13 showed EF 20-25%, severe LV dysfunction with global HK, + large Left pleural effusion, RV moderately down, moderate aortic stenosis, suspect component of low gradient AS. - RHC (10/23): Elevated filling pressures with moderately decreased cardiac output. RA 8, PA 48/16, PCWP 22, CO/CI (Fick) 5.7/2.7. - Echo 7/24: EF 20-25%, RV mildly reduced, RA/LA severely dilated, trivial MR, no AV regurgitation - RHC 8/12 on DBA 2.5: RA 17, PA 58/28 (41), Fick CO/CI 5.9/2.7, TD CO/CI 4.6/2.2, PVR 3.1, PAPi 1.8. Biventricular HF with moderately depressed CO and elevated filling pressures despite DBA support  - Felt not to be a suitable VAD candidate (Drs. Gala Romney & Bartle discussed). Patient declined VAD as well. -  NYHA IV. Volume overloaded on admission. -  MAPs remain borderline in setting of AS despite midodrine and DBA - Continue DBA 2.5, CO-OX 50%. Repeat CO-OX  -Marked volume overload. CVP 21-22. Continue IV lasix 160 mg twice a day.  - Sodium 120, give tolvaptan  - GDMT on hold with AKI and hypotension - Continue midodrine 10 mg tid. - Continue Unna boots - He has end-stage HF and severe AS now with volume overload and AKI. Admitted for diuresis and possible inotropic support. Worried that the main issue may be his cardiomyopathy and that TAVR might not be enough to improve his condition. Continue aggressive care for now -We discussed possible need for Palliative Care. Discussed again he is likely end stage.   Severe aortic stenosis, LVLG AS - Moderate on echo 10/23  - ?? Seen on 7/24 echo -  Admitted for TAVR eval, needs volume optimization first.  - LHC heart cath pending renal function improvement. RHC as above.  Structural Heart Team coming to see today    AKI on CKD stage 4 - SCr 3.2 peak , baseline 1.8-2.1 - SCr--> 2.6 -> 2.4- >3>2.7>2.29>2.19>>2.4>>2.4  today. Poor response to IV lasix.  - Avoid hypotension    CAD s/p late presentation inferior STEMI - LHC 05/23: 100% m RCA and 90% m LAD.  - RCA managed medically d/t completed infarct.  - Echo (5/23) EF 25%, RV moderately reduced, moderate to severe MR - Echo (10/23): EF 20-25%, RV moderately down. - s/p CABG x 2 (LIMA-LAD, SVG-Ramus)  (11/01/21) - No s/s angina - Continue ASA +  high intensity statin   Mitral regurgitation - Moderate to severe ischemic MR on TEE.  - S/p bioprosthetic MVR 11/02/21. - Warfarin on hold, cont heparin drip   Hyponatremia -  Given 15 mg tolvaptan . Got tolvaptan yesterday.  -Sodium 120.   -Give another dose of tolvaptan today.  - Limit free water.     H/O pericardial effusion/pericarditis - Moderate in size on past echo, w/o tamponade. - Acute pericarditis/ adhensions noted at time of CABG. - Resolved on echo 10/23 and not seen on echo 7/24   PAF - s/p MAZE w/ CABG  - Continue amiodarone 100 mg daily.  - Remains in NSR. Cont heparin drip   Palliative Care consulted.   Length of Stay: 7  Amy Clegg, NP  01/23/2023, 7:09 AM  Advanced Heart Failure Team Pager [phone] (M-F; 7a - 5p)  Please contact [facility] for night-coverage after hours (5p -7a ) and weekends on [email or web address]   Patient seen and examined with the above-signed Advanced Practice Provider and/or Housestaff. I personally reviewed laboratory data, imaging studies and relevant notes. I independently examined the patient and formulated the important aspects of the plan. I have edited the note to reflect any of my changes or salient points. I have personally discussed the plan with the patient and/or family.  He  continues to deteriorate with low co-ox and marked volume overload despite DBA support. More lethargic and tremulous.   General:  Ill-appearing. Jaundiced HEENT: normal Neck: supple. JVP to ear Cor:  Regular distant. + AS Lungs: clear Abdomen: soft, nontender, nondistended. No hepatosplenomegaly. No bruits or masses. Good bowel sounds. Extremities: no cyanosis, clubbing, rash, 3+ edema multiple ecchymoses Neuro: alert & oriented moves all 4 . Lethargic at times. Tremulous  He has end-stage HF with severe AS and multi-system organ failure. Now more uremic and likely also component of hepatic encephalopathy. HE has been see by TAVR team and Renal and is not candidate for further aggressive care.   I discussed this with Mr. Woodke and his family and they want to take him home with Hospice. We will arrange this as quickly as possible   On d/c would only use   Torsemide 80 bid with K 20 bid Midodrine 10 tid ASA 81  Would stop all other cardiac meds including warfarin, amio, atorvastatin, spiro, zetia and farxiga)  Code status changed to DNR.  Arvilla Meres, MD  9:22 PM

## 2023-01-23 NOTE — Progress Notes (Signed)
ANTICOAGULATION CONSULT NOTE - Follow Up Consult  Pharmacy Consult for heparin Indication: atrial fibrillation  No Known Allergies  Patient Measurements: Height: 6\' 1"  (185.4 cm) Weight: 92.2 kg (203 lb 4.2 oz) IBW/kg (Calculated) : 79.9 HEPARIN DW (KG): 84.8   Vital Signs: Temp: 97.8 F (36.6 C) (08/16 1039) Temp Source: Oral (08/16 1039) BP: 95/64 (08/16 1109) Pulse Rate: 60 (08/16 1109)  Labs: Recent Labs    01/21/23 0212 01/21/23 0940 01/22/23 0518 01/23/23 0400  HGB 9.8*  --  9.4* 8.7*  HCT 28.2*  --  26.5* 24.8*  PLT 103*  --  94* 93*  HEPARINUNFRC  --  0.13* 0.23* 0.50  CREATININE 2.19*  --  2.41* 2.20*  2.43*    Estimated Creatinine Clearance: 29.7 mL/min (A) (by C-G formula based on SCr of 2.43 mg/dL (H)).   Medical History: Past Medical History:  Diagnosis Date   Aortic atherosclerosis (HCC)    Arthritis    CHF (congestive heart failure) (HCC)    Esophageal stricture    Glaucoma    MI (myocardial infarction) (HCC)    Non-ST elevation (NSTEMI) myocardial infarction (HCC) 10/10/2021   NSTEMI (non-ST elevated myocardial infarction) (HCC) 10/10/2021   Osteoarthritis    Severe mitral regurgitation    Stricture and stenosis of esophagus     Medications:  Medications Prior to Admission  Medication Sig Dispense Refill Last Dose   acetaminophen (TYLENOL) 500 MG tablet Take 250 mg by mouth every 6 (six) hours as needed for moderate pain.   01/15/2023   albuterol (PROVENTIL) (2.5 MG/3ML) 0.083% nebulizer solution Inhale 3 mLs into the lungs every 6 (six) hours as needed for wheezing or shortness of breath. 90 mL 12 01/15/2023   albuterol (VENTOLIN HFA) 108 (90 Base) MCG/ACT inhaler Inhale 2 puffs into the lungs every 6 (six) hours as needed for wheezing or shortness of breath. 8 g 0 01/15/2023   amiodarone (PACERONE) 100 MG tablet TAKE ONE TABLET BY MOUTH ONCE DAILY 90 tablet 3 01/16/2023 at 0500   aspirin EC (ASPIRIN LOW DOSE) 81 MG tablet TAKE ONE TABLET BY  MOUTH ONCE DAILY 90 tablet 3 01/16/2023 at 0500   atorvastatin (LIPITOR) 80 MG tablet TAKE ONE TABLET BY MOUTH EVERYDAY AT BEDTIME (Patient taking differently: Take 80 mg by mouth daily.) 90 tablet 3 01/16/2023 at 0500   cyanocobalamin (VITAMIN B12) 1000 MCG/ML injection Inject 1 mL (1,000 mcg total) into the muscle every 30 (thirty) days. 1 mL 3 Past Month   dapagliflozin propanediol (FARXIGA) 10 MG TABS tablet Take 1 tablet (10 mg total) by mouth daily. 90 tablet 1 01/15/2023   esomeprazole (NEXIUM) 40 MG capsule Take 40 mg by mouth daily.   01/15/2023   ezetimibe (ZETIA) 10 MG tablet Take 0.5 tablets (5 mg total) by mouth daily. 90 tablet 1 01/15/2023   Furosemide (FUROSCIX [place]) Inject into the skin.   01/13/2023   Iron, Ferrous Sulfate, 325 (65 Fe) MG TABS Take 325 mg by mouth daily. 30 tablet 1 01/15/2023   latanoprost (XALATAN) 0.005 % ophthalmic solution Place 1 drop into both eyes at bedtime.   01/15/2023   metolazone (ZAROXOLYN) 2.5 MG tablet Take 1 tablet (2.5 mg total) by mouth daily as needed. 30 tablet 0 Past Month   midodrine (PROAMATINE) 10 MG tablet Take 1 tablet (10 mg total) by mouth 3 (three) times daily. 270 tablet 3 01/16/2023 at 0500   OVER THE COUNTER MEDICATION Apply 1 application  topically at bedtime. Medication: Mag Maxx Cream  Back and restless leg   Past Week   potassium chloride SA (KLOR-CON M) 20 MEQ tablet TAKE TWO TABLETS BY MOUTH ONCE DAILY 60 tablet 6 01/15/2023 at 0800   spironolactone (ALDACTONE) 25 MG tablet TAKE ONE TABLET BY MOUTH ONCE DAILY 90 tablet 3 01/15/2023   torsemide (DEMADEX) 20 MG tablet Take 4 tablets (80 mg total) by mouth daily. (Patient taking differently: Take 40-80 mg by mouth See admin instructions. Takes 80 mg in the morning and 40 mg at night for 3 days.) 120 tablet 5 01/15/2023   Fluticasone-Umeclidin-Vilant (TRELEGY ELLIPTA) 100-62.5-25 MCG/ACT AEPB Inhale 1 puff into the lungs daily. (Patient taking differently: Inhale 1 puff into the lungs daily as needed  (wheezing).) 1 each 11 More than a month   nitroGLYCERIN (NITROSTAT) 0.4 MG SL tablet Place 1 tablet (0.4 mg total) under the tongue every 5 (five) minutes as needed for chest pain. 25 tablet 3 More than a month   warfarin (COUMADIN) 1 MG tablet Take 2-3 tablets BY MOUTH daily AS DIRECTED by coumadin clinic (Patient taking differently: Take 3 mg by mouth daily.) 200 tablet 0 01/10/2023 at 0800   Scheduled:   amiodarone  100 mg Oral Daily   aspirin EC  81 mg Oral Daily   atorvastatin  80 mg Oral Daily   Chlorhexidine Gluconate Cloth  6 each Topical Daily   escitalopram  10 mg Oral Daily   ezetimibe  5 mg Oral Daily   Fe Fum-Vit C-Vit B12-FA  1 capsule Oral QPC breakfast   fluticasone furoate-vilanterol  1 puff Inhalation Daily   And   umeclidinium bromide  1 puff Inhalation Daily   gabapentin  600 mg Oral QHS   latanoprost  1 drop Both Eyes QHS   melatonin  3 mg Oral QHS   midodrine  10 mg Oral TID   pantoprazole  40 mg Oral Daily    Assessment: 77 yo male with acute chronic systolic HF on warfarin PTA. Oral anticoagulation to be on hold for cardiac cath. Pharmacy consulted to dose heparin.   INR previously 1.5 (on hold PTA for planned heart cath). Home warfarin regimen: 3mg /day (last dose taken 8/3).  S/p cath 8/12 Heparin restarted 8/13 at reduced rate as pt with some oozing from IV lines MD held heparin x 6 hours -restarted heparin 1100 uts/hr with heparin level low 0.23 oozing improving  -hgb stable 9 pltc low stable 90-100  Heparin drip increased yesterday 1200 uts/hr with big jump in heparin level 0.5 > will decrease for goal heparin leve closer to bottom end of range  - in SR Goal of Therapy:  Heparin level 0.3-0.7 units/ml Monitor platelets by anticoagulation protocol: Yes   Plan:  Decrease Heparin 1100 units/hr  Daily heparin level and cbc  F/u closely for further bleeding    Leota Sauers Pharm.D. CPP, BCPS Clinical Pharmacist [phone] 01/23/2023 12:44 PM

## 2023-01-23 NOTE — Progress Notes (Signed)
   This pt was referred to [place] for hospice care at home. I have been able to work up the referral and speak with both wife and daughter regarding comfort care at home. They are in agreement and report this is the pt's wishes as well. They will need equipment for comfort and safety at home. I have ordered hospital bed, Over bed table, W/C, and oxygen for use at home. This will be delivered STAT. Family has already left hospital to go home for delivery so that they will be prepared to bring him home tomorrow. He will need to go home by ambulance upon d/c. We will be able to accommodate pt for a visit after d/c home tomorrow. Norm Parcel RN [phone]

## 2023-01-23 NOTE — Progress Notes (Signed)
Orthopedic Tech Progress Note Patient Details:  Keith Jenkins [birth year] [id number]  Ortho Devices Type of Ortho Device: Radio broadcast assistant Ortho Device/Splint Location: BLE Ortho Device/Splint Interventions: Ordered, Application, Adjustment   Post Interventions Patient Tolerated: Well Instructions Provided: Care of device Patients family currently meeting with palliative care, will come back to apply unna boots as time allows. [name] 01/23/2023, 2:18 PM

## 2023-01-23 NOTE — Progress Notes (Addendum)
Mobility Specialist Progress Note:   01/23/23 1202  Mobility  Activity Ambulated with assistance in hallway  Level of Assistance Minimal assist, patient does 75% or more  Assistive Device Front wheel walker  Distance Ambulated (ft) 80 ft  Activity Response RN notified  Mobility Referral Yes  $Mobility charge 1 Mobility  Mobility Specialist Start Time (ACUTE ONLY) 1045  Mobility Specialist Stop Time (ACUTE ONLY) 1100  Mobility Specialist Time Calculation (min) (ACUTE ONLY) 15 min   Pre Mobility: 58 HR , 97/61 BP , 98% SpO2 During Mobility: 56 HR ,  94% SpO2 Post Mobility: 59 HR  Pt received in bed, agreeable to mobility. During ambulation pt c/o lightheadedness and SOB requring x2 standing rest breaks. Pursed lip breathing encouraged. When returning to room pt lost balance d/t leg weakness and dizziness requiring pt to be lowered to ground slowly on right side with gait belt. Pt softly bumped head on ground but appeared to have no injuries and denied any pain after fall. With the assist of nursing staff pt is safely transferred to chair with total assist and wheeled in room. Family notified. Pt left in room attended with nursing staff unharmed, alert and VSS. Family at bedside.   Leory Plowman  Mobility Specialist Please contact via [employer] office at [phone]

## 2023-01-23 NOTE — Consult Note (Signed)
   [facility] CM Inpatient Consult   01/23/2023  [name] 1945-11-26 [id number]  [facility] [[facility]]  Accountable Care Organization [ACO] Patient: Keith Jenkins Medicare  Primary Care Provider:  Blane Ohara, MD with [facility]   Patient is currently active with [facility] [[facility]] Care Management for chronic disease management services.  Patient has been engaged by a Tax inspector community based plan of care has focused on disease management and community resource support.  Patient with ongoing medical follow up.  Heart Failure team  Plan: Continue to follow for needs, will update RN CCM as needs are better known for disposition.  Of note, [facility] Care Management services does not replace or interfere with any services that are needed or arranged by inpatient [facility] care management team.   For additional questions or referrals please contact:  Charlesetta Shanks, RN BSN CCM [facility]  [phone] business mobile phone Toll free office [phone]  *Fax number: [phone] [email or web address]

## 2023-01-23 NOTE — TOC Progression Note (Addendum)
Transition of Care [facility]) - Progression Note    Patient Details  Name: Keith Jenkins MRN: [id number] Date of Birth: 1945/10/09  Transition of Care [facility]) CM/SW Contact  Elliot Cousin, RN Phone Number: [phone] 01/23/2023, 3:10 PM  Clinical Narrative:   Patient and family have decided on Home Hospice with [facility]. Contacted [facility] rep, Cheri with new referral. Dtr is requesting a hospital bed, table and wheelchair. Medicare list with rating placed on chart.   PTAR requested to transport home. Paperwork on chart for scheduled dc home tomorrow.     Expected Discharge Plan: Home w Hospice Care Barriers to Discharge: No Barriers Identified  Expected Discharge Plan and Services     Post Acute Care Choice: Hospice                               [facility] Agency: [facility] Date HH Agency Contacted: 01/23/23 Time HH Agency Contacted: 1509 Representative spoke with at [facility] Agency: Norm Parcel   Social Determinants of Health (SDOH) Interventions SDOH Screenings   Food Insecurity: No Food Insecurity (01/17/2023)  Housing: Low Risk  (01/17/2023)  Transportation Needs: Unmet Transportation Needs (01/17/2023)  Utilities: Not At Risk (01/17/2023)  Alcohol Screen: Low Risk  (04/17/2022)  Depression (PHQ2-9): Medium Risk (01/12/2023)  Financial Resource Strain: High Risk (08/28/2022)  Physical Activity: Sufficiently Active (04/14/2022)  Social Connections: Moderately Integrated (04/17/2022)  Stress: No Stress Concern Present (04/14/2022)  Tobacco Use: Medium Risk (01/17/2023)  Health Literacy: Adequate Health Literacy (01/12/2023)    Readmission Risk Interventions    03/14/2022   11:02 AM  Readmission Risk Prevention Plan  Transportation Screening Complete  Medication Review (RN Care Manager) Complete  PCP or Specialist appointment within 3-5 days of discharge Complete  HRI or Home Care Consult Complete

## 2023-01-23 NOTE — Progress Notes (Addendum)
Paged Dr Lendell Caprice. -Will recheck labs.  Latest Reference Range & Units 01/22/23 20:15  Sodium 135 - 145 mmol/L 117 (LL)   Recheck @4am  , Na 120.

## 2023-01-23 NOTE — Consult Note (Signed)
Consultation Note Date: 01/23/2023   Patient Name: Keith Jenkins  DOB: 03/25/1946  MRN: [id number]  Age / Sex: 77 y.o., male  PCP: Blane Ohara, MD Referring Physician: Dolores [name], MD  Reason for Consultation: Establishing goals of care, "End-stage HF, goals of care discussion."  HPI/Patient Profile: 77 y.o. male  with past medical history of  COPD, prediabetes, GERD/dysphagia, HLD, and CAD s/p CABG/MVR and systolic heart failure was admitted on 01/16/2023 with acute on chronic systolic heart failure, severe aortic stenosis, AKI on CKD 4, hyponatremia, mitral regurgitation, fatigue/weakness.   Clinical Assessment and Goals of Care: 12:30 PM I have reviewed medical records including EPIC notes, labs, and imaging.  Not a candidate for TAVR. Received report from primary RN -no acute concerns.  Per RN, patient had an assisted fall this morning.   Went to visit patient at bedside -he was sitting on edge of the bed with nursing staff assisting.  Spoke with son/[name] briefly outside of patient's room -he indicates that patient's daughter would like to be present for meeting today.  PMT will return after 2 PM.  2:10 PM Went to visit patient at bedside - son/[name], daughter/[name], son in law/[name], wife/[name] present. Patient was lying in bed awake, alert, oriented, and able to participate in conversation. No signs or non-verbal gestures of pain or discomfort noted. No respiratory distress, increased work of breathing, or secretions noted.  Patient is ill and frail appearing.  Met with patient and his family to discuss diagnosis, prognosis, GOC, EOL wishes, disposition, and options.  I introduced Palliative Medicine as specialized medical care for people living with serious illness. It focuses on providing relief from the symptoms and stress of a serious illness. The goal is to improve quality of life for both the  patient and the family.  We discussed a brief life review of the patient as well as functional and nutritional status.  Patient and his wife have been married 57 years -they have 1 son and 1 daughter.  He indicates they are "very blessed."  Prior to hospitalization, patient lives in a private residence with his wife.  His daughter lives next-door.  Therapeutic listening provided as he reflects on his increased symptom burden over the last several months.  We discussed patient's current illness and what it means in the larger context of patient's on-going co-morbidities.  Education provided that CHF and CKD are progressive, non-curable disease underlying the patient's current acute medical conditions. Cardiorenal syndrome education reviewed and the delicate balance, when it can be achieved, of managing one organ without harming the other. Discussed he is at end stages of chronic diseases as well as not a dialysis candidate. Patient tells me, "I don't know what to do, I've never been this sick before." Natural disease trajectory and expectations at EOL were discussed. I attempted to elicit values and goals of care important to the patient. The difference between aggressive medical intervention and comfort care was considered in light of the patient's goals of care.   Provided education and counseling at length on the philosophy and benefits of hospice care. Discussed that it offers a holistic approach to care in the setting of end-stage illness, and is about supporting the patient where they are allowing nature to take it's course. Discussed the hospice team includes RNs, physicians, social workers, and chaplains. They can provide personal care, support for the family, and help keep patient out of the hospital as well as assist with DME needs for home hospice. Education provided  on the difference between home vs residential hospice. Patient tells me, "I'd like that" when discussing hospice philosophy. Patient  confirms his goal is to go home with hospice support, requesting [facility] (other family had a positive experience). Family confirm they can provide 24/7 supervision/assistance for patient at home.  Discharge timing reviewed. Family indicate previous discussion with attending that discharge might be Monday. Patient asks, "can I go home sooner?" He confirms he is ready to go home as soon as possible. Reviewed discharge timing in context of goals. Family/patient have DME needs. Recommended discharge after DME has been delivered to the home for smooth transition - patient/family are agreeable; they want hospital bed in place prior to him going home. Patient expresses joy that he might get to go home tomorrow.   Goals while he remains in house are to continue medical optimization.   Encouraged patient to consider DNR/DNI status understanding evidenced based poor outcomes in similar hospitalized patient, as the cause of arrest is likely associated with advanced chronic/terminal illness rather than an easily reversible acute cardio-pulmonary event.  I shared that even if we pursued resuscitation we would not able to resolve the underlying factors. I explained that DNR/DNI does not change the medical plan and it only comes into effect after a person has arrested (died).  It is a protective measure to keep [place] from harming the patient in their last moments of life. Patient tells me, "I want to go in my sleep." He validates he wants a peaceful passing without aggressive interventions when it's his time.  Visit also consisted of discussions dealing with the complex and emotionally intense issues of symptom management and palliative care in the setting of serious and potentially life-threatening illness.  Discussed with patient/family the importance of continued conversation with each other and the medical providers regarding overall plan of care and treatment options, ensuring decisions are within the  context of the patient's values and GOCs.    Questions and concerns were addressed. The patient/family was encouraged to call with questions and/or concerns. PMT card was provided.   Primary Decision Maker: PATIENT    SUMMARY OF RECOMMENDATIONS   Continue to medically optimize Now DNR/DNI Goal is for discharge home with hospice as soon as possible after DME delivery. Likely discharge tomorrow 8/17 TOC and hospice liaison notified; [facility] consulted for: home hospice referral, requesting [facility] [place] Daughter/[name] primary contact for discharge/hospice coordination PMT will continue to follow and support holistically   Code Status/Advance Care Planning: DNR  Palliative Prophylaxis:  Aspiration, Frequent Pain Assessment, Oral Care, and Turn Reposition  Additional Recommendations (Limitations, Scope, Preferences): Avoid Hospitalization, Full Scope Treatment, No Artificial Feeding, and No Tracheostomy  Psycho-social/Spiritual:  Desire for further Chaplaincy support:no Created space and opportunity for patient and family to express thoughts and feelings regarding patient's current medical situation.  Emotional support and therapeutic listening provided.  Prognosis:  < 6 weeks  Discharge Planning: Home with Hospice      Primary Diagnoses: Present on Admission:  Acute on chronic systolic CHF (congestive heart failure) (HCC)  Stage 3b chronic kidney disease (CKD) (HCC)  Hypotension  Paroxysmal atrial fibrillation (HCC)  Hyponatremia   I have reviewed the medical record, interviewed the patient and family, and examined the patient. The following aspects are pertinent.  Past Medical History:  Diagnosis Date   Aortic atherosclerosis (HCC)    Arthritis    CHF (congestive heart failure) (HCC)    Esophageal stricture    Glaucoma  MI (myocardial infarction) (HCC)    Non-ST elevation (NSTEMI) myocardial infarction (HCC) 10/10/2021   NSTEMI (non-ST elevated  myocardial infarction) (HCC) 10/10/2021   Osteoarthritis    Severe mitral regurgitation    Stricture and stenosis of esophagus    Social History   Socioeconomic History   Marital status: Married    Spouse name: Not on file   Number of children: 2   Years of education: Not on file   Highest education level: Not on file  Occupational History   Occupation: Retired-truck driver  Tobacco Use   Smoking status: Former    Current packs/day: 0.00    Types: Cigarettes    Quit date: 1970    Years since quitting: 54.6   Smokeless tobacco: Never  Vaping Use   Vaping status: Never Used  Substance and Sexual Activity   Alcohol use: Not Currently   Drug use: Never   Sexual activity: Not Currently    Birth control/protection: None  Other Topics Concern   Not on file  Social History Narrative   Not on file   Social Determinants of Health   Financial Resource Strain: High Risk (08/28/2022)   Overall Financial Resource Strain (CARDIA)    Difficulty of Paying Living Expenses: Very hard  Food Insecurity: No Food Insecurity (01/17/2023)   Hunger Vital Sign    Worried About Running Out of Food in the Last Year: Never true    Ran Out of Food in the Last Year: Never true  Transportation Needs: Unmet Transportation Needs (01/17/2023)   PRAPARE - Transportation    Lack of Transportation (Medical): Yes    Lack of Transportation (Non-Medical): Yes  Physical Activity: Sufficiently Active (04/14/2022)   Exercise Vital Sign    Days of Exercise per Week: 5 days    Minutes of Exercise per Session: 30 min  Stress: No Stress Concern Present (04/14/2022)   [employer] of Occupational Health - Occupational Stress Questionnaire    Feeling of Stress : Not at all  Social Connections: Moderately Integrated (04/17/2022)   Social Connection and Isolation Panel [NHANES]    Frequency of Communication with Friends and Family: More than three times a week    Frequency of Social Gatherings with Friends and  Family: More than three times a week    Attends Religious Services: More than 4 times per year    Active Member of [employer] or Organizations: No    Attends Engineer, structural: Never    Marital Status: Married   Family History  Problem Relation Age of Onset   Heart attack Mother    Hypertension Mother    Heart attack Father    Hypertension Father    Stomach cancer Sister    Brain cancer Sister    Scheduled Meds:  amiodarone  100 mg Oral Daily   aspirin EC  81 mg Oral Daily   atorvastatin  80 mg Oral Daily   Chlorhexidine Gluconate Cloth  6 each Topical Daily   escitalopram  10 mg Oral Daily   ezetimibe  5 mg Oral Daily   Fe Fum-Vit C-Vit B12-FA  1 capsule Oral QPC breakfast   fluticasone furoate-vilanterol  1 puff Inhalation Daily   And   umeclidinium bromide  1 puff Inhalation Daily   gabapentin  600 mg Oral QHS   latanoprost  1 drop Both Eyes QHS   melatonin  3 mg Oral QHS   midodrine  10 mg Oral TID   pantoprazole  40 mg Oral Daily  Continuous Infusions:  DOBUTamine 2.5 mcg/kg/min (01/22/23 1828)   furosemide 160 mg (01/23/23 0907)   heparin 1,100 Units/hr (01/23/23 1302)   PRN Meds:.acetaminophen, albuterol, nitroGLYCERIN, ondansetron (ZOFRAN) IV, mouth rinse, phenylephrine Medications Prior to Admission:  Prior to Admission medications   Medication Sig Start Date End Date Taking? Authorizing Provider  acetaminophen (TYLENOL) 500 MG tablet Take 250 mg by mouth every 6 (six) hours as needed for moderate pain.   Yes [provider]  albuterol (PROVENTIL) (2.5 MG/3ML) 0.083% nebulizer solution Inhale 3 mLs into the lungs every 6 (six) hours as needed for wheezing or shortness of breath. 05/05/22  Yes [name], NP  albuterol (VENTOLIN HFA) 108 (90 Base) MCG/ACT inhaler Inhale 2 puffs into the lungs every 6 (six) hours as needed for wheezing or shortness of breath. 05/05/22  Yes [name], NP  amiodarone (PACERONE) 100 MG tablet TAKE ONE  TABLET BY MOUTH ONCE DAILY 12/18/22  Yes Bensimhon, Bevelyn Buckles, MD  aspirin EC (ASPIRIN LOW DOSE) 81 MG tablet TAKE ONE TABLET BY MOUTH ONCE DAILY 12/18/22  Yes Bensimhon, Bevelyn Buckles, MD  atorvastatin (LIPITOR) 80 MG tablet TAKE ONE TABLET BY MOUTH EVERYDAY AT BEDTIME Patient taking differently: Take 80 mg by mouth daily. 12/18/22  Yes Bensimhon, Bevelyn Buckles, MD  cyanocobalamin (VITAMIN B12) 1000 MCG/ML injection Inject 1 mL (1,000 mcg total) into the muscle every 30 (thirty) days. 10/14/22  Yes Cox, Kirsten, MD  dapagliflozin propanediol (FARXIGA) 10 MG TABS tablet Take 1 tablet (10 mg total) by mouth daily. 05/08/22  Yes Bensimhon, Bevelyn Buckles, MD  esomeprazole (NEXIUM) 40 MG capsule Take 40 mg by mouth daily.   Yes [provider]  ezetimibe (ZETIA) 10 MG tablet Take 0.5 tablets (5 mg total) by mouth daily. 06/20/22  Yes [name], NP  Furosemide (FUROSCIX [place]) Inject into the skin.   Yes [provider]  Iron, Ferrous Sulfate, 325 (65 Fe) MG TABS Take 325 mg by mouth daily. 05/15/22  Yes [name], NP  latanoprost (XALATAN) 0.005 % ophthalmic solution Place 1 drop into both eyes at bedtime. 06/10/21  Yes [provider]  metolazone (ZAROXOLYN) 2.5 MG tablet Take 1 tablet (2.5 mg total) by mouth daily as needed. 01/13/23  Yes Cox, Kirsten, MD  midodrine (PROAMATINE) 10 MG tablet Take 1 tablet (10 mg total) by mouth 3 (three) times daily. 11/28/22  Yes Bensimhon, Bevelyn Buckles, MD  OVER THE COUNTER MEDICATION Apply 1 application  topically at bedtime. Medication: Mag Maxx Cream Back and restless leg   Yes [provider]  potassium chloride SA (KLOR-CON M) 20 MEQ tablet TAKE TWO TABLETS BY MOUTH ONCE DAILY 01/14/23  Yes Bensimhon, Bevelyn Buckles, MD  spironolactone (ALDACTONE) 25 MG tablet TAKE ONE TABLET BY MOUTH ONCE DAILY 12/18/22  Yes Bensimhon, Bevelyn Buckles, MD  torsemide (DEMADEX) 20 MG tablet Take 4 tablets (80 mg total) by mouth daily. Patient taking differently: Take 40-80 mg  by mouth See admin instructions. Takes 80 mg in the morning and 40 mg at night for 3 days. 12/26/22  Yes Alen Bleacher, NP  escitalopram (LEXAPRO) 10 MG tablet Take 1 tablet (10 mg total) by mouth daily. 01/19/23   Cox, Fritzi Mandes, MD  Fluticasone-Umeclidin-Vilant (TRELEGY ELLIPTA) 100-62.5-25 MCG/ACT AEPB Inhale 1 puff into the lungs daily. Patient taking differently: Inhale 1 puff into the lungs daily as needed (wheezing). 05/14/22   [name], NP  gabapentin (NEURONTIN) 600 MG tablet Take 1 tablet (600 mg total)  by mouth at bedtime. 01/19/23   Cox, Fritzi Mandes, MD  nitroGLYCERIN (NITROSTAT) 0.4 MG SL tablet Place 1 tablet (0.4 mg total) under the tongue every 5 (five) minutes as needed for chest pain. 12/04/21 03/13/23  Jacklynn Ganong, FNP  warfarin (COUMADIN) 1 MG tablet Take 2-3 tablets BY MOUTH daily AS DIRECTED by coumadin clinic Patient taking differently: Take 3 mg by mouth daily. 08/28/22   Bensimhon, Bevelyn Buckles, MD   No Known Allergies Review of Systems  Constitutional:  Positive for activity change and fatigue.  Respiratory:  Negative for shortness of breath.   Gastrointestinal:  Negative for nausea and vomiting.  Neurological:  Positive for weakness.  All other systems reviewed and are negative.   Physical Exam Vitals and nursing note reviewed.  Constitutional:      General: He is not in acute distress.    Appearance: He is ill-appearing.  Pulmonary:     Effort: No respiratory distress.  Skin:    General: Skin is warm and dry.  Neurological:     Mental Status: He is alert and oriented to person, place, and time.     Motor: Weakness present.  Psychiatric:        Attention and Perception: Attention normal.        Behavior: Behavior is cooperative.        Cognition and Memory: Cognition and memory normal.     Vital Signs: BP 95/64 Comment: post fall vitals  Pulse 60   Temp 97.8 F (36.6 C) (Oral)   Resp 20   Ht 6\' 1"  (1.854 m)   Wt 92.2 kg   SpO2 93%   BMI 26.82  kg/m  Pain Scale: 0-10 POSS *See Group Information*: 1-Acceptable,Awake and alert Pain Score: 0-No pain   SpO2: SpO2: 93 % O2 Device:SpO2: 93 % O2 Flow Rate: .O2 Flow Rate (L/min): 3 L/min  IO: Intake/output summary:  Intake/Output Summary (Last 24 hours) at 01/23/2023 1502 Last data filed at 01/23/2023 1004 Gross per 24 hour  Intake 1229.49 ml  Output 1100 ml  Net 129.49 ml    LBM: Last BM Date : 01/18/2023 Baseline Weight: Weight: 84.8 kg Most recent weight: Weight: 92.2 kg     Palliative Assessment/Data: PPS 50%     Time In-Out: 1230-1300/1410-1510 Time Total: 90 minutes  Greater than 50%  of this time was spent counseling and coordinating care related to the above assessment and plan.  Signed by: Haskel Khan, NP   Please contact Palliative Medicine Team phone at [phone] for questions and concerns.  For individual provider: See Amion  *Portions of this note are a verbal dictation therefore any spelling and/or grammatical errors are due to the "Dragon Medical One" system interpretation.

## 2023-01-23 NOTE — Plan of Care (Signed)

## 2023-01-24 ENCOUNTER — Other Ambulatory Visit (HOSPITAL_COMMUNITY): Payer: Self-pay

## 2023-01-24 DIAGNOSIS — I480 Paroxysmal atrial fibrillation: Secondary | ICD-10-CM | POA: Diagnosis not present

## 2023-01-24 DIAGNOSIS — I5023 Acute on chronic systolic (congestive) heart failure: Secondary | ICD-10-CM | POA: Diagnosis not present

## 2023-01-24 DIAGNOSIS — R531 Weakness: Secondary | ICD-10-CM | POA: Diagnosis not present

## 2023-01-24 DIAGNOSIS — Z515 Encounter for palliative care: Secondary | ICD-10-CM | POA: Diagnosis not present

## 2023-01-24 DIAGNOSIS — Z7189 Other specified counseling: Secondary | ICD-10-CM | POA: Diagnosis not present

## 2023-01-24 DIAGNOSIS — R4182 Altered mental status, unspecified: Secondary | ICD-10-CM | POA: Diagnosis not present

## 2023-01-24 DIAGNOSIS — R609 Edema, unspecified: Secondary | ICD-10-CM | POA: Diagnosis not present

## 2023-01-24 DIAGNOSIS — Z7401 Bed confinement status: Secondary | ICD-10-CM | POA: Diagnosis not present

## 2023-01-24 LAB — COOXEMETRY PANEL
Carboxyhemoglobin: 1.7 % — ABNORMAL HIGH (ref 0.5–1.5)
Methemoglobin: <0.7 % (ref 0.0–1.5)
O2 Saturation: 48.6 %
Total hemoglobin: 8.6 g/dL — ABNORMAL LOW (ref 12.0–16.0)

## 2023-01-24 LAB — BASIC METABOLIC PANEL
Anion gap: 10 (ref 5–15)
BUN: 87 mg/dL — ABNORMAL HIGH (ref 8–23)
CO2: 21 mmol/L — ABNORMAL LOW (ref 22–32)
Calcium: 8.2 mg/dL — ABNORMAL LOW (ref 8.9–10.3)
Chloride: 89 mmol/L — ABNORMAL LOW (ref 98–111)
Creatinine, Ser: 2.58 mg/dL — ABNORMAL HIGH (ref 0.61–1.24)
GFR, Estimated: 25 mL/min — ABNORMAL LOW (ref >60)
Glucose, Bld: 106 mg/dL — ABNORMAL HIGH (ref 70–99)
Potassium: 4.2 mmol/L (ref 3.5–5.1)
Sodium: 120 mmol/L — ABNORMAL LOW (ref 135–145)

## 2023-01-24 LAB — CBC
HCT: 24 % — ABNORMAL LOW (ref 39.0–52.0)
Hemoglobin: 8.5 g/dL — ABNORMAL LOW (ref 13.0–17.0)
MCH: 31.3 pg (ref 26.0–34.0)
MCHC: 35.4 g/dL (ref 30.0–36.0)
MCV: 88.2 fL (ref 80.0–100.0)
Platelets: 96 10*3/uL — ABNORMAL LOW (ref 150–400)
RBC: 2.72 MIL/uL — ABNORMAL LOW (ref 4.22–5.81)
RDW: 16.9 % — ABNORMAL HIGH (ref 11.5–15.5)
WBC: 7.3 10*3/uL (ref 4.0–10.5)
nRBC: 0 % (ref 0.0–0.2)

## 2023-01-24 LAB — HEPARIN LEVEL (UNFRACTIONATED): Heparin Unfractionated: 0.26 [IU]/mL — ABNORMAL LOW (ref 0.30–0.70)

## 2023-01-24 LAB — MAGNESIUM: Magnesium: 2.8 mg/dL — ABNORMAL HIGH (ref 1.7–2.4)

## 2023-01-24 MED ORDER — HYDROMORPHONE HCL 2 MG PO TABS
2.0000 mg | ORAL_TABLET | ORAL | 0 refills | Status: DC | PRN
Start: 2023-01-24 — End: 2023-02-04
  Filled 2023-01-24: qty 30, 5d supply, fill #0

## 2023-01-24 MED ORDER — MORPHINE SULFATE (PF) 2 MG/ML IV SOLN
2.0000 mg | Freq: Once | INTRAVENOUS | Status: AC
  Administered 2023-01-24: 2 mg via INTRAVENOUS
  Filled 2023-01-24: qty 1

## 2023-01-24 MED ORDER — POTASSIUM CHLORIDE CRYS ER 20 MEQ PO TBCR
20.0000 meq | EXTENDED_RELEASE_TABLET | Freq: Two times a day (BID) | ORAL | 6 refills | Status: DC
  Filled 2023-01-24: qty 60, 30d supply, fill #0

## 2023-01-24 MED ORDER — TORSEMIDE 20 MG PO TABS
80.0000 mg | ORAL_TABLET | Freq: Two times a day (BID) | ORAL | 5 refills | Status: DC
Start: 2023-01-24 — End: 2023-02-04
  Filled 2023-01-24: qty 160, 20d supply, fill #0

## 2023-01-24 MED ORDER — LORAZEPAM 1 MG PO TABS
1.0000 mg | ORAL_TABLET | ORAL | 0 refills | Status: DC | PRN
Start: 2023-01-24 — End: 2023-02-04
  Filled 2023-01-24: qty 30, 6d supply, fill #0

## 2023-01-24 NOTE — Progress Notes (Signed)
Daily Progress Note   Patient Name: Keith Jenkins       Date: 01/24/2023 DOB: [birth year]  Age: 77 y.o. MRN#: [id number] Attending Physician: Dolores [name], MD Primary Care Physician: Blane Ohara, MD Admit Date: 01/16/2023  Reason for Consultation/Follow-up: Establishing goals of care, Non pain symptom management, Pain control, and Psychosocial/spiritual support  Subjective: I have reviewed medical records including EPIC notes, MAR, and labs.  Discussed case with medical team as well as hospice liaison.  Hospice RN is scheduled to see patient at 2 PM today if discharge can be coordinated in time.  Recommended lorazepam 1mg  tablet q4h PRN anxiety and dilaudid tablet 2-4 mg q2h PRN pain, shortness of breath for comfort medications at discharge.  Received report from primary RN -no acute concerns.  Met patient's son/[name] outside of room -emotional support provided.  Therapeutic listening provided as he reflects over the course of patient's illness and the context of own life.  Barbara Cower indicates that "I just want him to be happy."  Went to visit patient at bedside - son son/[name] followed to be present.  Patient was lying in bed awake, alert, oriented, and able to participate in conversation. No signs or non-verbal gestures of pain or discomfort noted. No respiratory distress, increased work of breathing, or secretions noted.  He denies pain or shortness of breath.  Emotional support provided to patient.  Provided updates to patient and Barbara Cower regarding discharge planning/timing and hospice RN admission at 2 PM.  Patient expresses joy and readiness to be discharged home as soon as possible.  Reviewed comfort medications that will be available to him per his request.  All questions and concerns  addressed. Encouraged to call with questions and/or concerns. PMT card previously provided.  Length of Stay: 8  Current Medications: Scheduled Meds:   amiodarone  100 mg Oral Daily   aspirin EC  81 mg Oral Daily   atorvastatin  80 mg Oral Daily   Chlorhexidine Gluconate Cloth  6 each Topical Daily   escitalopram  10 mg Oral Daily   ezetimibe  5 mg Oral Daily   Fe Fum-Vit C-Vit B12-FA  1 capsule Oral QPC breakfast   fluticasone furoate-vilanterol  1 puff Inhalation Daily   And   umeclidinium bromide  1 puff Inhalation Daily   gabapentin  600 mg Oral QHS  latanoprost  1 drop Both Eyes QHS   melatonin  3 mg Oral QHS   midodrine  10 mg Oral TID   pantoprazole  40 mg Oral Daily    Continuous Infusions:  DOBUTamine 2.5 mcg/kg/min (01/22/23 1828)   furosemide 160 mg (01/23/23 1622)   heparin Stopped (01/24/23 0648)    PRN Meds: acetaminophen, albuterol, nitroGLYCERIN, ondansetron (ZOFRAN) IV, mouth rinse, phenylephrine  Physical Exam Vitals and nursing note reviewed.  Constitutional:      General: He is not in acute distress.    Appearance: He is cachectic. He is ill-appearing.  Pulmonary:     Effort: No respiratory distress.  Skin:    General: Skin is warm and dry.  Neurological:     Mental Status: He is alert and oriented to person, place, and time.     Motor: Weakness present.  Psychiatric:        Attention and Perception: Attention normal.        Behavior: Behavior is cooperative.        Cognition and Memory: Cognition and memory normal.             Vital Signs: BP (!) 73/49 (BP Location: Left Arm)   Pulse 68   Temp 98.2 F (36.8 C) (Oral)   Resp 16   Ht 6\' 1"  (1.854 m)   Wt 92.2 kg   SpO2 97%   BMI 26.82 kg/m  SpO2: SpO2: 97 % O2 Device: O2 Device: Room Air O2 Flow Rate: O2 Flow Rate (L/min): 3 L/min  Intake/output summary:  Intake/Output Summary (Last 24 hours) at 01/24/2023 0935 Last data filed at 01/24/2023 0533 Gross per 24 hour  Intake 1226.34 ml   Output 400 ml  Net 826.34 ml   LBM: Last BM Date : 01/27/2023 Baseline Weight: Weight: 84.8 kg Most recent weight: Weight: 92.2 kg       Palliative Assessment/Data: PPS 50%      Patient Active Problem List   Diagnosis Date Noted   Severe aortic stenosis 01/23/2023   Acute on chronic systolic CHF (congestive heart failure) (HCC) 01/16/2023   Aortic valve stenosis 01/14/2023   Chronic kidney disease, stage IV (severe) (HCC) 01/13/2023   Bradycardia 01/13/2023   Abrasion of arm, left, initial encounter 01/13/2023   CHF (congestive heart failure) (HCC) 01/01/2023   Hyponatremia 01/01/2023   Fall at home, initial encounter 01/01/2023   Hypotension 09/14/2022   CAD (coronary artery disease) 09/14/2022   B12 deficiency 09/14/2022   Paroxysmal atrial fibrillation (HCC) 09/14/2022   Chronic systolic heart failure (HCC) 09/12/2022   Prediabetes 04/14/2022   Atherosclerosis of aorta (HCC) 04/14/2022   Abnormal esophagram    Stage 3b chronic kidney disease (CKD) (HCC) 02/24/2022   Mitral valve replaced 11/26/2021   Esophageal dysphagia    S/P mitral valve replacement 11/01/2021   Acute on chronic combined systolic (congestive) and diastolic (congestive) heart failure (HCC) 10/24/2021   Pericardial effusion    Hyperlipidemia 10/10/2021    Palliative Care Assessment & Plan   Patient Profile: 77 y.o. male  with past medical history of  COPD, prediabetes, GERD/dysphagia, HLD, and CAD s/p CABG/MVR and systolic heart failure was admitted on 01/16/2023 with acute on chronic systolic heart failure, severe aortic stenosis, AKI on CKD 4, hyponatremia, mitral regurgitation, fatigue/weakness.   Assessment: Principal Problem:   Acute on chronic systolic CHF (congestive heart failure) (HCC) Active Problems:   Stage 3b chronic kidney disease (CKD) (HCC)   Hypotension   Paroxysmal atrial fibrillation (HCC)  Hyponatremia   Aortic valve stenosis   Severe aortic stenosis   Concern about end  of life  Recommendations/Plan: Discharge home today with hospice  Continue DNR/DNI - durable DNR form completed and placed in shadow chart. Copy was made and will be scanned into Vynca/ACP tab Recommended comfort medications at discharge: lorazepam 1mg  tablet q4h PRN anxiety and dilaudid tablet 2-4 mg q2h PRN pain, shortness of breath   Goals of Care and Additional Recommendations: Limitations on Scope of Treatment: Avoid Hospitalization  Code Status:    Code Status Orders  (From admission, onward)           Start     Ordered   01/23/23 1509  Do not attempt resuscitation (DNR)  Continuous       Question Answer Comment  If patient has no pulse and is not breathing Do Not Attempt Resuscitation   If patient has a pulse and/or is breathing: Medical Treatment Goals LIMITED ADDITIONAL INTERVENTIONS: Use medication/IV fluids and cardiac monitoring as indicated; Do not use intubation or mechanical ventilation (DNI), also provide comfort medications.  Transfer to Progressive/Stepdown as indicated, avoid Intensive Care.   Consent: Discussion documented in EHR or advanced directives reviewed      01/23/23 1508           Code Status History     Date Active Date Inactive Code Status Order ID Comments User Context   01/16/2023 0912 01/23/2023 1508 Full Code [id number]  Smitty Knudsen Inpatient   01/01/2023 1914 01/03/2023 1823 Full Code [id number]  Rometta Emery, MD ED   03/12/2022 1806 03/18/2022 2137 Full Code [id number]  Leone Brand, NP Inpatient   11/01/2021 1528 11/18/2021 2028 Full Code [id number]  Parke Poisson Inpatient   10/24/2021 2145 11/01/2021 1528 Full Code [id number]  Little Ishikawa, MD Inpatient   10/10/2021 1411 10/16/2021 1835 Full Code [id number]  Corky Crafts, MD Inpatient      Advance Directive Documentation    Flowsheet Row Most Recent Value  Type of Advance Directive Healthcare Power of Attorney, Living will  Pre-existing out of  facility DNR order (yellow form or pink MOST form) --  "MOST" Form in Place? --       Prognosis:  < 6 weeks  Discharge Planning: Home with Hospice  Care plan was discussed with primary RN, Dr. Gala Romney, Robet Leu, PA, [facility], hospice liaison  Thank you for allowing the Palliative Medicine Team to assist in the care of this patient.   Total Time 50 minutes Prolonged Time Billed  no       Greater than 50%  of this time was spent counseling and coordinating care related to the above assessment and plan.  Haskel Khan, NP  Please contact Palliative Medicine Team phone at [phone] for questions and concerns.   *Portions of this note are a verbal dictation therefore any spelling and/or grammatical errors are due to the "Dragon Medical One" system interpretation.

## 2023-01-24 NOTE — Progress Notes (Addendum)
Pt refused standing WT. " I am unsteady" per Pt.   Noted that Pt Is now frequently coughing and spitting out clotted blood but not much. He  stated that he had this even before. HGB is 8.5. pt still on Heparin Qtt @11 . No further active bleeding noted. Care Ongoing.

## 2023-01-24 NOTE — Progress Notes (Signed)
Per nightshift nurse patient having hemoptysis. IV heparin on hold until MD order. Dr. Gala Romney aware and okay for iv heparin to be stopped. Charted on MAR.

## 2023-01-24 NOTE — TOC Transition Note (Signed)
Transition of Care [facility]) - CM/SW Discharge Note   Patient Details  Name: Keith Jenkins MRN: [id number] Date of Birth: [birth year]  Transition [facility]) CM/SW Contact:  Ronny Bacon, RN Phone Number: 01/24/2023, 10:36 AM   Clinical Narrative:  Patient is being discharged home with hospice today. Phone all to spouse to verify address on file. DNR form on chart with PTAR forms. PTAR notified of patient discharge.     Final next level of care: Home w Hospice Care Barriers to Discharge: No Barriers Identified   Patient Goals and CMS Choice CMS Medicare.gov Compare Post Acute Care list provided to:: Patient Represenative (must comment) (daughter-Danette) Choice offered to / list presented to : Patient  Discharge Placement                         Discharge Plan and Services Additional resources added to the After Visit Summary for       Post Acute Care Choice: Hospice                      [facility] Agency: [facility] Date [facility] Agency Contacted: 01/23/23 Time HH Agency Contacted: 1509 Representative spoke with at [facility] Agency: Norm Parcel  Social Determinants of Health (SDOH) Interventions SDOH Screenings   Food Insecurity: No Food Insecurity (01/17/2023)  Housing: Low Risk  (01/17/2023)  Transportation Needs: Unmet Transportation Needs (01/17/2023)  Utilities: Not At Risk (01/17/2023)  Alcohol Screen: Low Risk  (04/17/2022)  Depression (PHQ2-9): Medium Risk (01/12/2023)  Financial Resource Strain: High Risk (08/28/2022)  Physical Activity: Sufficiently Active (04/14/2022)  Social Connections: Moderately Integrated (04/17/2022)  Stress: No Stress Concern Present (04/14/2022)  Tobacco Use: Medium Risk (01/17/2023)  Health Literacy: Adequate Health Literacy (01/12/2023)     Readmission Risk Interventions    03/14/2022   11:02 AM  Readmission Risk Prevention Plan  Transportation Screening Complete  Medication Review (RN Care Manager) Complete  PCP or  Specialist appointment within 3-5 days of discharge Complete  HRI or Home Care Consult Complete

## 2023-01-26 ENCOUNTER — Other Ambulatory Visit: Payer: Medicare HMO

## 2023-01-26 ENCOUNTER — Other Ambulatory Visit: Payer: Self-pay

## 2023-01-26 NOTE — Transitions of Care (Post Inpatient/ED Visit) (Signed)
01/26/2023  Name: Keith Jenkins MRN: [id number] DOB: August 23, 1945  Today's TOC FU Call Status: Today's TOC FU Call Status:: Successful TOC FU Call Completed TOC FU Call Complete Date: 01/26/23  Transition Care Management Follow-up Telephone Call Date of Discharge: 01/24/23 Discharge Facility: [facility]) Type of Discharge: Inpatient Admission Primary Inpatient Discharge Diagnosis:: End stage CHF Reason for ED Visit: Cardiac Conditions Cardiac Conditions Diagnosis: Heart Failure (CHF,Heart Failure Diagnosis) How have you been since you were released from the hospital?: Same Any questions or concerns?: No  Items Reviewed: Did you receive and understand the discharge instructions provided?: Yes Medications obtained,verified, and reconciled?: No (patient has been transitioned to [facility] and is not taking all of his medications, just some) Medications Not Reviewed Reasons:: Other: (the patient has been transitioned over to [facility] and has been discontinued off of most of his medications) Any new allergies since your discharge?: No Dietary orders reviewed?: No Do you have support at home?: Yes People in Home: child(ren), adult, spouse Name of Support/Comfort Primary Source: Geoff Warley, wife; children and grandchildren  Medications Reviewed Today: Medications Reviewed Today     Reviewed by Marlowe Sax, RN (Case Manager) on 01/26/23 at 1016  Med List Status: <None>   Medication Order Taking? Sig Documenting Provider Last Dose Status Informant  acetaminophen (TYLENOL) 500 MG tablet [id number] No Take 250 mg by mouth every 6 (six) hours as needed for moderate pain. [provider] 01/15/2023 Active Child, Self, Spouse/Significant Other  albuterol (PROVENTIL) (2.5 MG/3ML) 0.083% nebulizer solution [id number] No Inhale 3 mLs into the lungs every 6 (six) hours as needed for wheezing or shortness of breath. [name], NP 01/15/2023 Active Child, Self, Spouse/Significant  Other  albuterol (VENTOLIN HFA) 108 (90 Base) MCG/ACT inhaler [id number] No Inhale 2 puffs into the lungs every 6 (six) hours as needed for wheezing or shortness of breath. [name], NP 01/15/2023 Active Child, Self, Spouse/Significant Other  aspirin EC (ASPIRIN LOW DOSE) 81 MG tablet [id number] No TAKE ONE TABLET BY MOUTH ONCE DAILY Bensimhon, Bevelyn Buckles, MD 01/16/2023 0500 Active Child, Self, Spouse/Significant Other  escitalopram (LEXAPRO) 10 MG tablet [id number]  Take 1 tablet (10 mg total) by mouth daily. Cox, Kirsten, MD  Active   Fluticasone-Umeclidin-Vilant (TRELEGY ELLIPTA) 100-62.5-25 MCG/ACT AEPB [id number] No Inhale 1 puff into the lungs daily.  Patient taking differently: Inhale 1 puff into the lungs daily as needed (wheezing).   [name], NP More than a month Active Child, Self, Spouse/Significant Other  HYDROmorphone (DILAUDID) 2 MG tablet [id number]  Take 1 tablet (2 mg total) by mouth every 2 (two) hours as needed for severe pain or moderate pain (shortness of breath). Jonita Albee, PA-C  Active   latanoprost (XALATAN) 0.005 % ophthalmic solution [id number] No Place 1 drop into both eyes at bedtime. [provider] 01/15/2023 Active Child, Self, Spouse/Significant Other  LORazepam (ATIVAN) 1 MG tablet [id number]  Take 1 tablet (1 mg total) by mouth every 4 (four) hours as needed for anxiety. Jonita Albee, PA-C  Active   midodrine (PROAMATINE) 10 MG tablet [id number] No Take 1 tablet (10 mg total) by mouth 3 (three) times daily. Bensimhon, Bevelyn Buckles, MD 01/16/2023 0500 Active Child, Self, Spouse/Significant Other  OVER THE COUNTER MEDICATION [id number] No Apply 1 application  topically at bedtime. Medication: Mag Maxx Cream Back and restless leg [provider] Past Week Active Child, Self, Spouse/Significant Other  potassium chloride SA (KLOR-CON M) 20 MEQ tablet [id number]  Take 1 tablet (20 mEq total) by mouth 2 (two) times daily. Jonita Albee, PA-C   Active   torsemide (DEMADEX) 20 MG tablet [id number]  Take 4 tablets (80 mg total) by mouth 2 (two) times daily. Jonita Albee, PA-C  Active             Home Care and Equipment/Supplies: Were Home Health Services Ordered?: Yes Name of Home Health Agency:: The patient is now under Hospice Care and they will be taking care of the patient in the home Has Agency set up a time to come to your home?: Yes First Home Health Visit Date: 01/24/23 Any new equipment or medical supplies ordered?: Yes Name of Medical supply agency?: Hospice is managing equipment and medication needs Were you able to get the equipment/medical supplies?: Yes  Functional Questionnaire: Do you need assistance with bathing/showering or dressing?: Yes (family assisting) Do you need assistance with meal preparation?: Yes (family assisting) Do you need assistance with eating?: No Do you have difficulty maintaining continence: No Do you need assistance with getting out of bed/getting out of a chair/moving?: Yes (family assisting) Do you have difficulty managing or taking your medications?: No  Follow up appointments reviewed: PCP Follow-up appointment confirmed?: NA (patient is under hospice care now, appointment for November cancelled, staff notified) Specialist Hospital Follow-up appointment confirmed?: NA (the patient is end-stage CHF, with Hospice Care) Do you need transportation to your follow-up appointment?: No (the patient under Hospice Care, no transportation needed) Do you understand care options if your condition(s) worsen?: Yes-patient verbalized understanding    Alto Denver RN, MSN, CCM RN Care Manager  [facility]  Ambulatory Care Management  Direct Number: [phone]

## 2023-01-26 NOTE — Patient Instructions (Signed)
Visit Information  Thank you for taking time to visit with me today. Please don't hesitate to contact me if I can be of assistance to you before our next scheduled telephone appointment.  Following are the goals we discussed today:  Today's TOC FU Call Status: Today's TOC FU Call Status:: Successful TOC FU Call Completed TOC FU Call Complete Date: 01/26/23  Transition Care Management Follow-up Telephone Call Date of Discharge: 01/24/23 [facility]) Type of Discharge: Inpatient Admission Primary Inpatient Discharge Diagnosis:: End stage CHF Reason for ED Visit: Cardiac Conditions Cardiac Conditions Diagnosis: Heart Failure (CHF,Heart Failure Diagnosis) How have you been since you were released from the hospital?: Same Any questions or concerns?: No  Items Reviewed: Did you receive and understand the discharge instructions provided?: Yes Medications obtained,verified, and reconciled?: No (patient has been transitioned to [facility] and is not taking all of his medications, just some) Medications Not Reviewed Reasons:: Other: (the patient has been transitioned over to [facility] and has been discontinued off of most of his medications) Any new allergies since your discharge?: No Dietary orders reviewed?: No Do you have support at home?: Yes People in Home: child(ren), adult, spouse Name of Support/Comfort Primary Source: Javyn Pollack, wife; children and grandchildren  Medications Reviewed Today: Medications Reviewed Today     Reviewed by Marlowe Sax, RN (Case Manager) on 01/26/23 at 1016  Med List Status: <None>   Medication Order Taking? Sig Documenting Provider Last Dose Status Informant  acetaminophen (TYLENOL) 500 MG tablet [id number] No Take 250 mg by mouth every 6 (six) hours as needed for moderate pain. [provider] 01/15/2023 Active Child, Self, Spouse/Significant Other  albuterol (PROVENTIL) (2.5 MG/3ML) 0.083% nebulizer solution  [id number] No Inhale 3 mLs into the lungs every 6 (six) hours as needed for wheezing or shortness of breath. [name], NP 01/15/2023 Active Child, Self, Spouse/Significant Other  albuterol (VENTOLIN HFA) 108 (90 Base) MCG/ACT inhaler [id number] No Inhale 2 puffs into the lungs every 6 (six) hours as needed for wheezing or shortness of breath. [name], NP 01/15/2023 Active Child, Self, Spouse/Significant Other  aspirin EC (ASPIRIN LOW DOSE) 81 MG tablet [id number] No TAKE ONE TABLET BY MOUTH ONCE DAILY Bensimhon, Bevelyn Buckles, MD 01/16/2023 0500 Active Child, Self, Spouse/Significant Other  escitalopram (LEXAPRO) 10 MG tablet [id number]  Take 1 tablet (10 mg total) by mouth daily. Cox, Kirsten, MD  Active   Fluticasone-Umeclidin-Vilant (TRELEGY ELLIPTA) 100-62.5-25 MCG/ACT AEPB [id number] No Inhale 1 puff into the lungs daily.  Patient taking differently: Inhale 1 puff into the lungs daily as needed (wheezing).   [name], NP More than a month Active Child, Self, Spouse/Significant Other  HYDROmorphone (DILAUDID) 2 MG tablet [id number]  Take 1 tablet (2 mg total) by mouth every 2 (two) hours as needed for severe pain or moderate pain (shortness of breath). Jonita Albee, PA-C  Active   latanoprost (XALATAN) 0.005 % ophthalmic solution [id number] No Place 1 drop into both eyes at bedtime. [provider] 01/15/2023 Active Child, Self, Spouse/Significant Other  LORazepam (ATIVAN) 1 MG tablet [id number]  Take 1 tablet (1 mg total) by mouth every 4 (four) hours as needed for anxiety. Jonita Albee, PA-C  Active   midodrine (PROAMATINE) 10 MG tablet [id number] No Take 1 tablet (10 mg total) by mouth 3 (three) times daily. Bensimhon, Bevelyn Buckles, MD 01/16/2023 0500 Active Child, Self, Spouse/Significant Other  OVER THE COUNTER MEDICATION [id number] No Apply 1 application  topically  at bedtime. Medication: Mag Maxx Cream Back and restless leg [provider] Past Week Active Child,  Self, Spouse/Significant Other  potassium chloride SA (KLOR-CON M) 20 MEQ tablet [id number]  Take 1 tablet (20 mEq total) by mouth 2 (two) times daily. Jonita Albee, PA-C  Active   torsemide (DEMADEX) 20 MG tablet [id number]  Take 4 tablets (80 mg total) by mouth 2 (two) times daily. Jonita Albee, PA-C  Active             Home Care and Equipment/Supplies: Were Home Health Services Ordered?: Yes Name of Home Health Agency:: The patient is now under Hospice Care and they will be taking care of the patient in the home Has Agency set up a time to come to your home?: Yes First Home Health Visit Date: 01/24/23 Any new equipment or medical supplies ordered?: Yes Name of Medical supply agency?: Hospice is managing equipment and medication needs Were you able to get the equipment/medical supplies?: Yes  Functional Questionnaire: Do you need assistance with bathing/showering or dressing?: Yes (family assisting) Do you need assistance with meal preparation?: Yes (family assisting) Do you need assistance with eating?: No Do you have difficulty maintaining continence: No Do you need assistance with getting out of bed/getting out of a chair/moving?: Yes (family assisting) Do you have difficulty managing or taking your medications?: No  Follow up appointments reviewed: PCP Follow-up appointment confirmed?: NA (patient is under hospice care now, appointment for November cancelled, staff notified) Specialist Hospital Follow-up appointment confirmed?: NA (the patient is end-stage CHF, with Hospice Care) Do you need transportation to your follow-up appointment?: No (the patient under Hospice Care, no transportation needed) Do you understand care options if your condition(s) worsen?: Yes-patient verbalized understanding    Alto Denver RN, MSN, CCM RN Care Manager  [facility] Health  Ambulatory Care Management  Direct Number: [phone]   If you are experiencing a Mental Health or Behavioral  Health Crisis or need someone to talk to, please call the Suicide and Crisis Lifeline: 988 call the [place] National Suicide Prevention Lifeline: 562-826-2984 or TTY: 228-477-5234 TTY (707)099-9586) to talk to a trained counselor call 1-800-273-TALK (toll free, 24 hour hotline) go to [facility] [address] [place] [phone])   Patient verbalizes understanding of instructions and care plan provided today and agrees to view in MyChart. Active MyChart status and patient understanding of how to access instructions and care plan via MyChart confirmed with patient.     No further follow up required: the patient has transitioned to [facility], the family has the [facility] information to call for changes and new needs as they arise.   Alto Denver RN, MSN, CCM RN Care Manager  [facility]  Ambulatory Care Management  Direct Number: [phone]

## 2023-01-27 ENCOUNTER — Ambulatory Visit: Payer: Medicare HMO

## 2023-01-27 ENCOUNTER — Telehealth: Payer: Self-pay | Admitting: Family Medicine

## 2023-01-27 NOTE — Telephone Encounter (Signed)
[facility] 01/26/23

## 2023-01-28 ENCOUNTER — Telehealth: Payer: Self-pay

## 2023-01-28 NOTE — Telephone Encounter (Signed)
poc & cti: [facility]

## 2023-02-02 ENCOUNTER — Telehealth: Payer: Self-pay

## 2023-02-02 NOTE — Telephone Encounter (Signed)
I spoke with Keith Jenkins and she wanted me to let you know that Mr. Cutrone passed away at 7:00 this morning (I think she stated this morning- it was hard to understand her).

## 2023-02-08 DEATH — deceased

## 2023-04-20 ENCOUNTER — Ambulatory Visit: Payer: Medicare HMO | Admitting: Family Medicine

## 2024-04-01 IMAGING — DX DG CHEST 1V PORT
1 series · 2 of 2 positions shown · non-contrast
Comparison: Portable exam 7089 hours compared to 10/09/2021

CLINICAL DATA: NSTEMI

EXAM:
PORTABLE CHEST 1 VIEW

[Series 1: chest · 0.14mm/px · 2 of 2 slices shown]
[im 1/2]
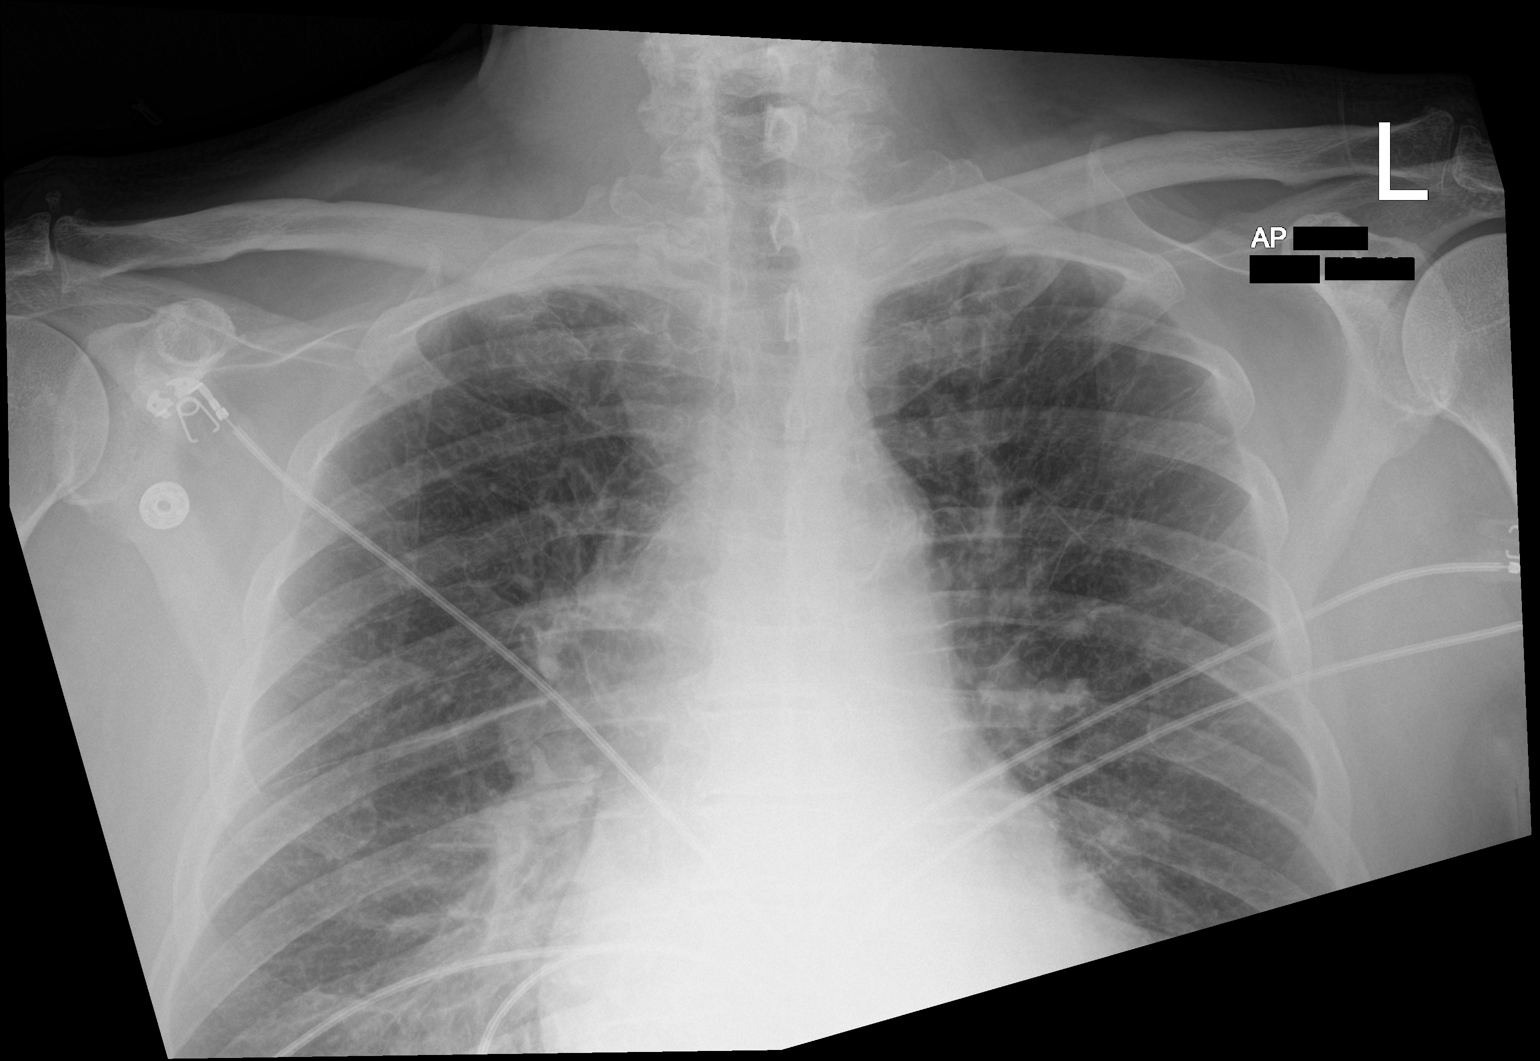
[im 2/2]
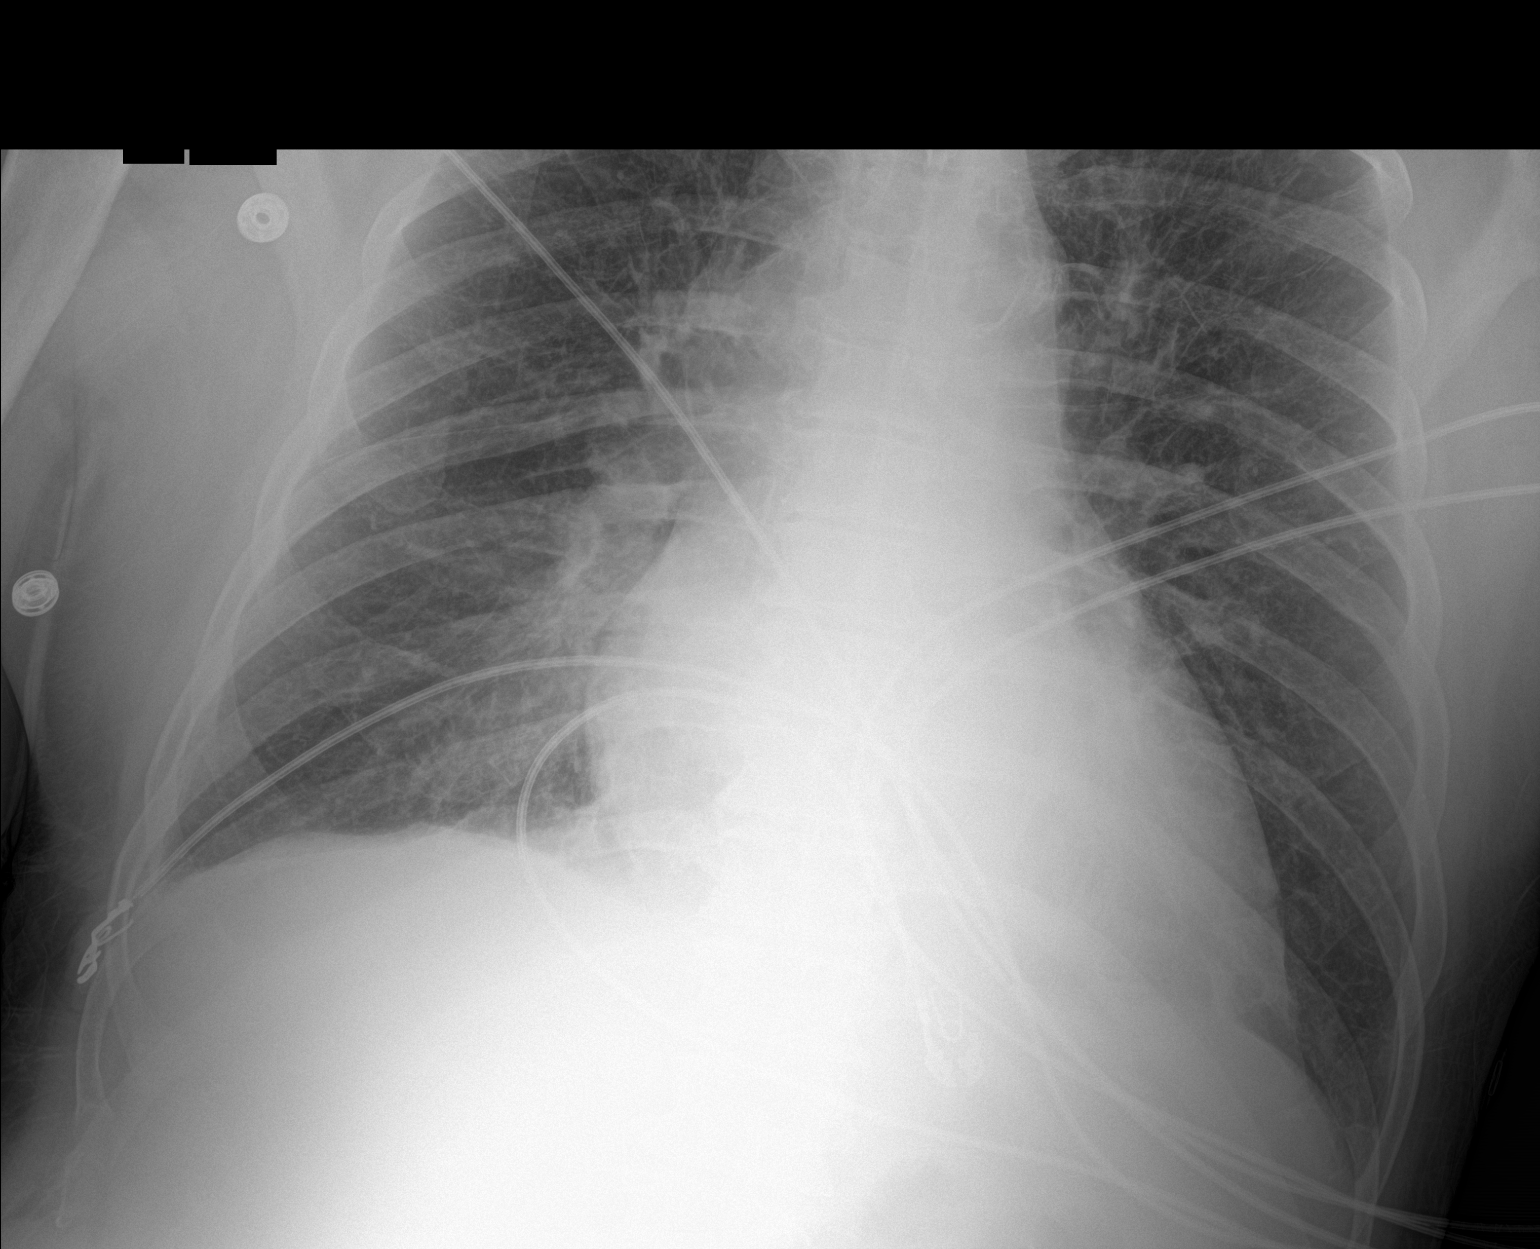

[2 of 2 positions shown; findings below may reference images not displayed]

FINDINGS: Upper normal heart size.

Mediastinal contours and pulmonary vascularity normal.

Atherosclerotic calcification aorta.

Question atelectasis versus infiltrate RIGHT lower lobe medially.

Remaining lungs clear.

No pleural effusion or pneumothorax.

No acute osseous findings.
IMPRESSION: Question atelectasis versus infiltrate in medial RIGHT lower lobe.

## 2024-04-15 IMAGING — DX DG CHEST 1V PORT
1 series · 1 of 1 positions shown · non-contrast
Comparison: 10/23/2021

CLINICAL DATA: Dyspnea

EXAM:
PORTABLE CHEST 1 VIEW

[chest ap]
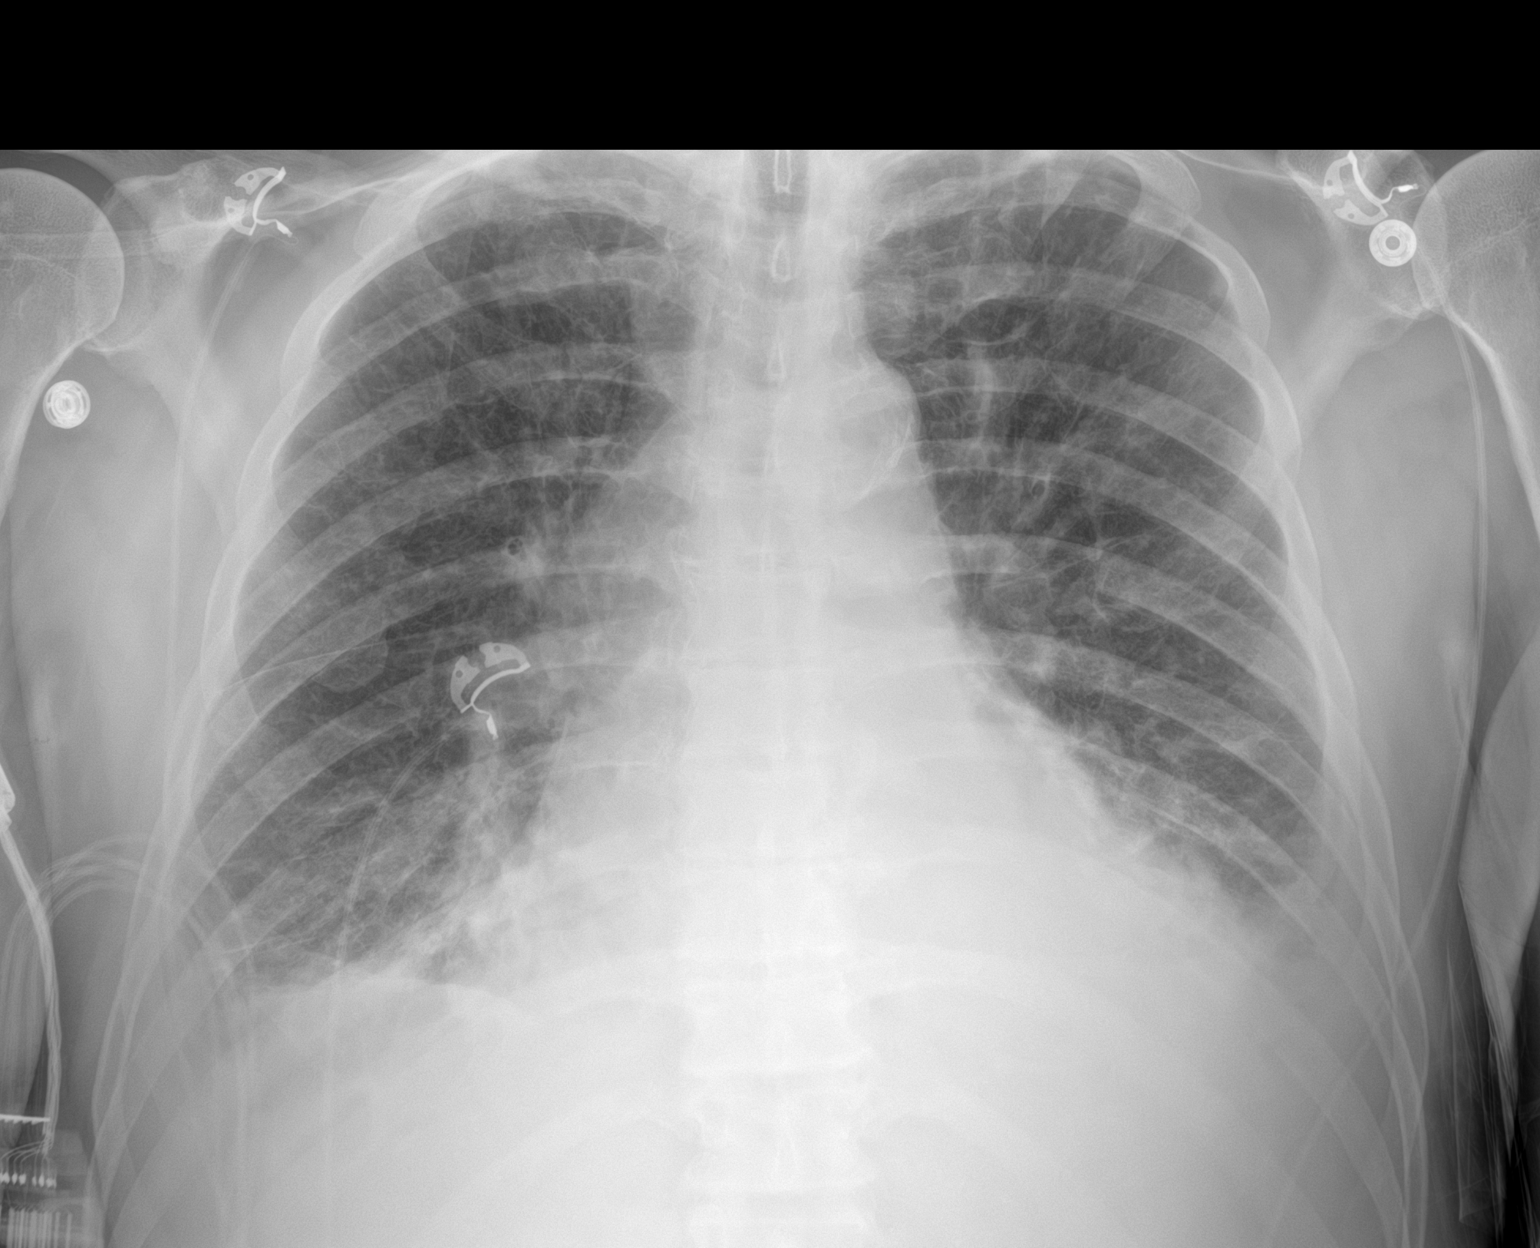

[1 of 1 positions shown; findings below may reference images not displayed]

FINDINGS: Mild enlargement of the cardiopericardial silhouette. Blunting of
both costophrenic angles, stable on the right and slightly more
striking on the left, with persistent retrocardiac opacity.

Atherosclerotic calcification of the aortic arch.
IMPRESSION: 1. Bilateral pleural effusions, perhaps minimally increased on the
left. Persistent left lower lobe retrocardiac airspace opacity.
2.  Aortic Atherosclerosis ([id number]).
3. Stable mild enlargement of the cardiopericardial silhouette.

## 2024-04-20 IMAGING — DX DG CHEST 1V PORT
1 series · 1 of 1 positions shown · non-contrast
Comparison: Chest x-ray dated October 24, 2021

CLINICAL DATA: Dyspnea

EXAM:
PORTABLE CHEST 1 VIEW

[chest ap]
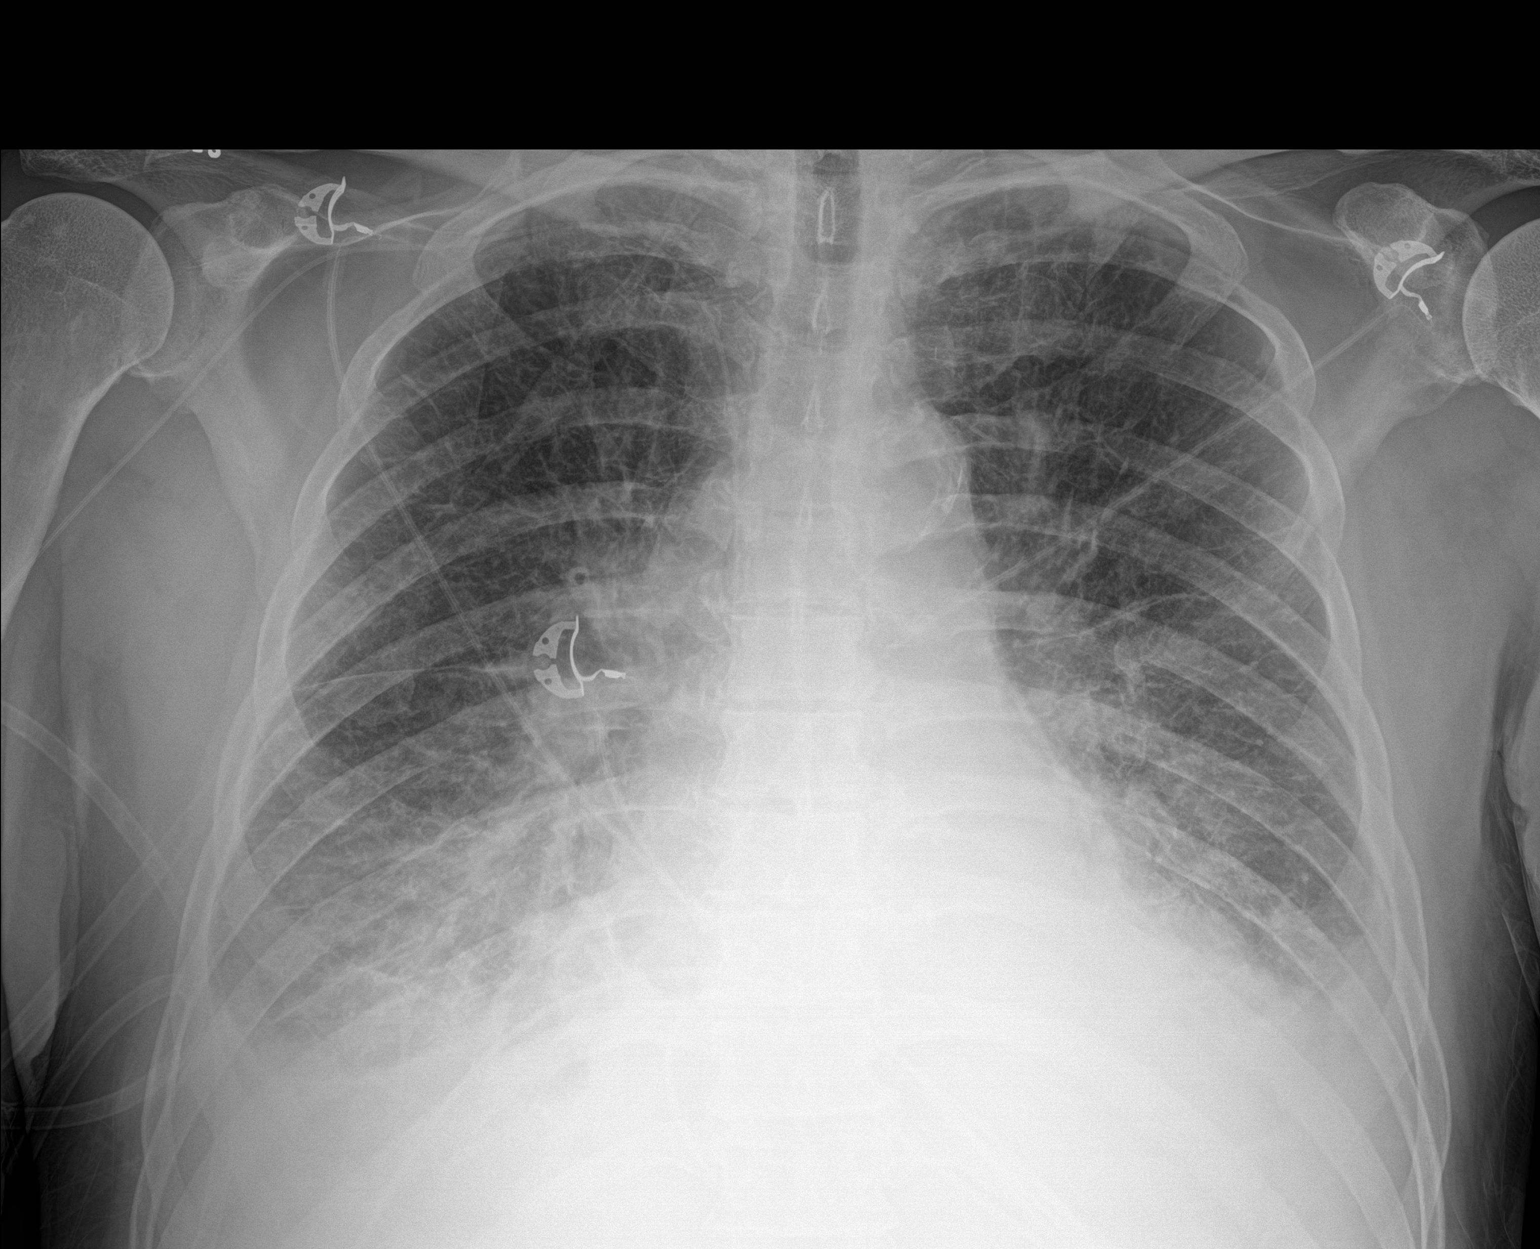

[1 of 1 positions shown; findings below may reference images not displayed]

FINDINGS: Cardiac and mediastinal contours are unchanged. Interval placement
of right arm PICC with tip positioned over the expected area of the
mid SVC. Small bilateral pleural effusions. Increased bilateral
interstitial opacities, likely due to worsening pulmonary edema. No
evidence of pneumothorax.
IMPRESSION: 1. Interval placement of right arm PICC with tip positioned at the
expected area of the mid SVC.
2. Similar small bilateral pleural effusions. Increased bilateral
interstitial opacities, likely due to worsening pulmonary edema.

## 2024-04-23 IMAGING — DX DG CHEST 1V PORT
1 series · 1 of 1 positions shown · non-contrast
Comparison: Previous studies including the examination of
10/29/2021

CLINICAL DATA: Status post mitral valve replacement

EXAM:
PORTABLE CHEST 1 VIEW

[chest ap]
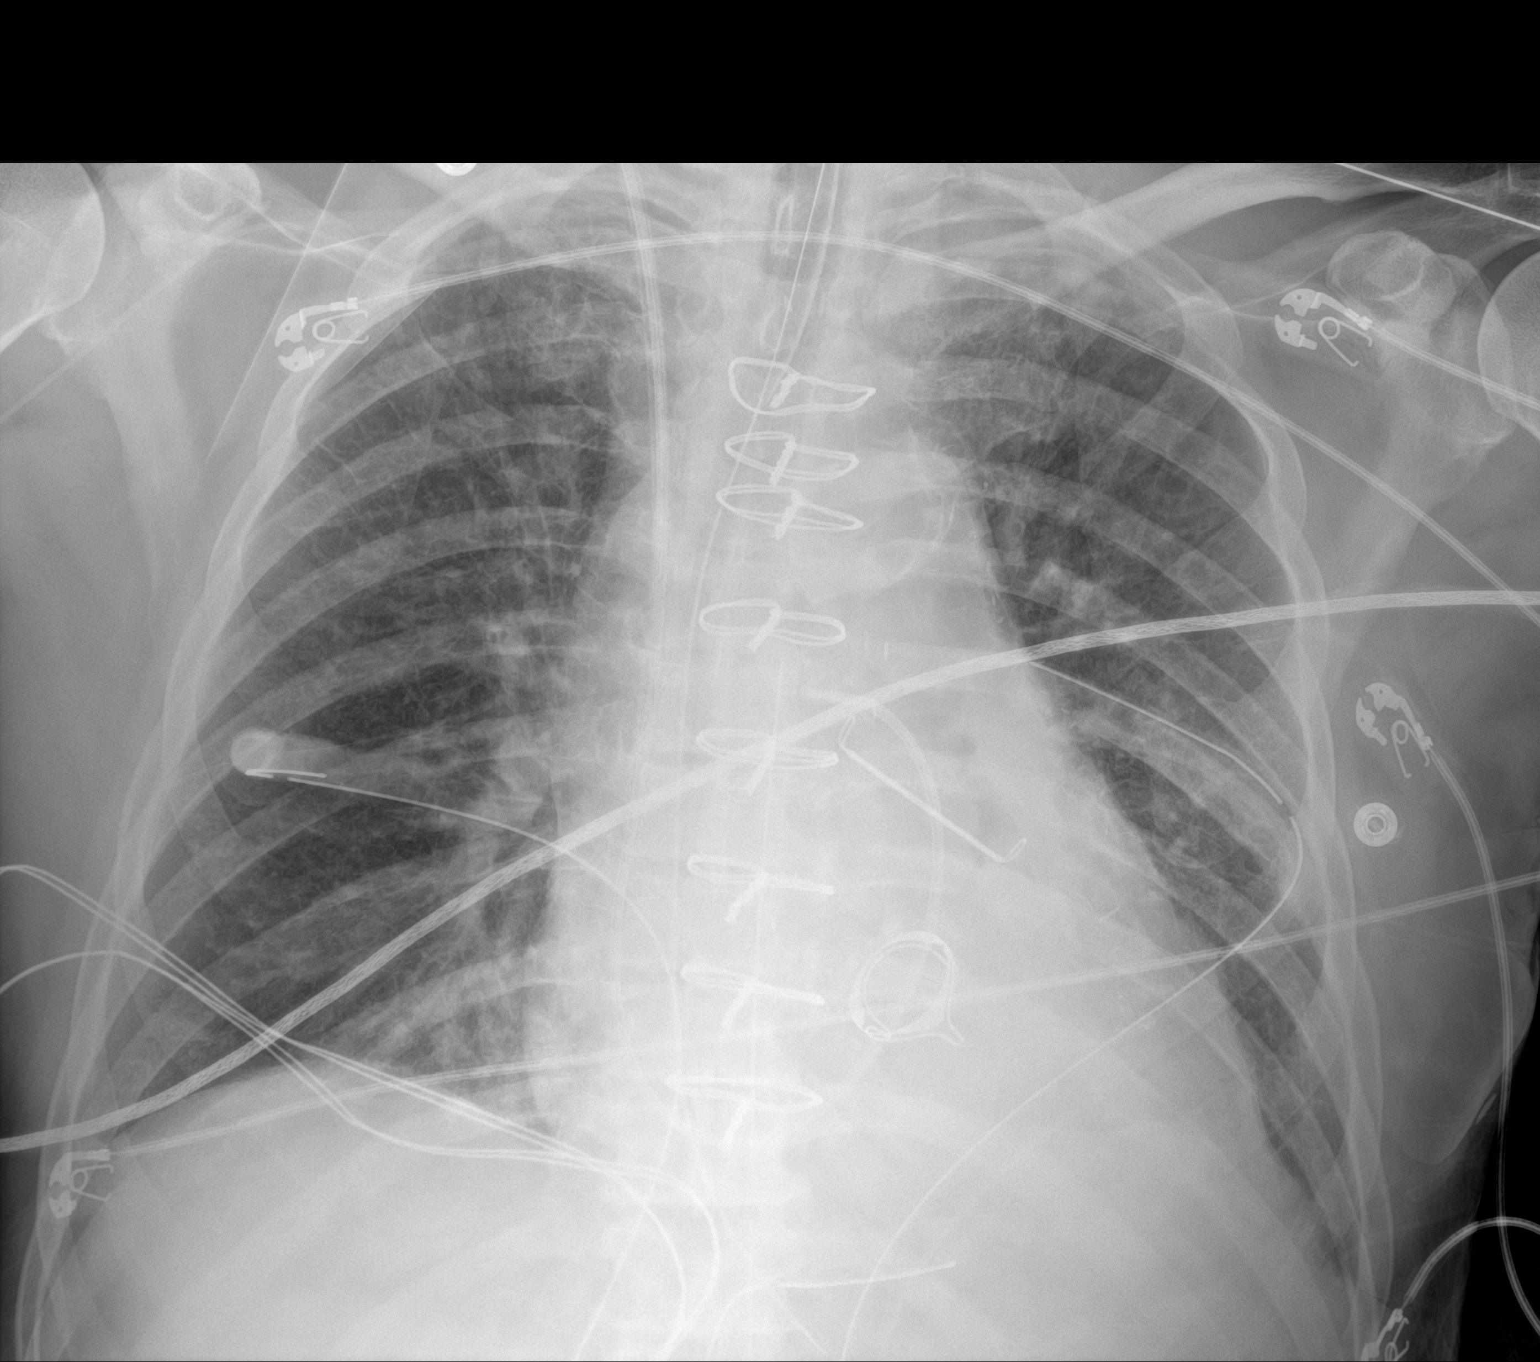

[1 of 1 positions shown; findings below may reference images not displayed]

FINDINGS: Transverse diameter of heart is increased. There is interval
decrease in pulmonary vascular congestion and pulmonary edema. There
is increased density in the left lower lung fields obscuring left
hemidiaphragm. There is interval placement of prosthetic mitral
valve. There is a metallic clamp in the region of left atrial
appendage. Tip endotracheal tube is proximally 4.2 cm above the
carina. Enteric tube is noted traversing the esophagus. Tip of
Swan-Ganz catheter is seen in the region of main pulmonary artery.
Mediastinal drain is noted. Bilateral chest tubes are seen. Distal
course of right PICC line is not distinctly visualized. There is
small left apical pneumothorax. There is no significant pleural
effusion.
IMPRESSION: Cardiomegaly. There is interval decrease in CHF. Increased density
in the medial left lower lung fields may suggest
atelectasis/pneumonia. Small left apical pneumothorax.

Other findings as described in the body of the report.

## 2024-04-26 IMAGING — DX DG CHEST 1V PORT
2 series · 2 of 2 positions shown · non-contrast
Comparison: Prior chest x-ray yesterday

CLINICAL DATA: CABG

EXAM:
PORTABLE CHEST 1 VIEW

[chest ap (1 of 2)]
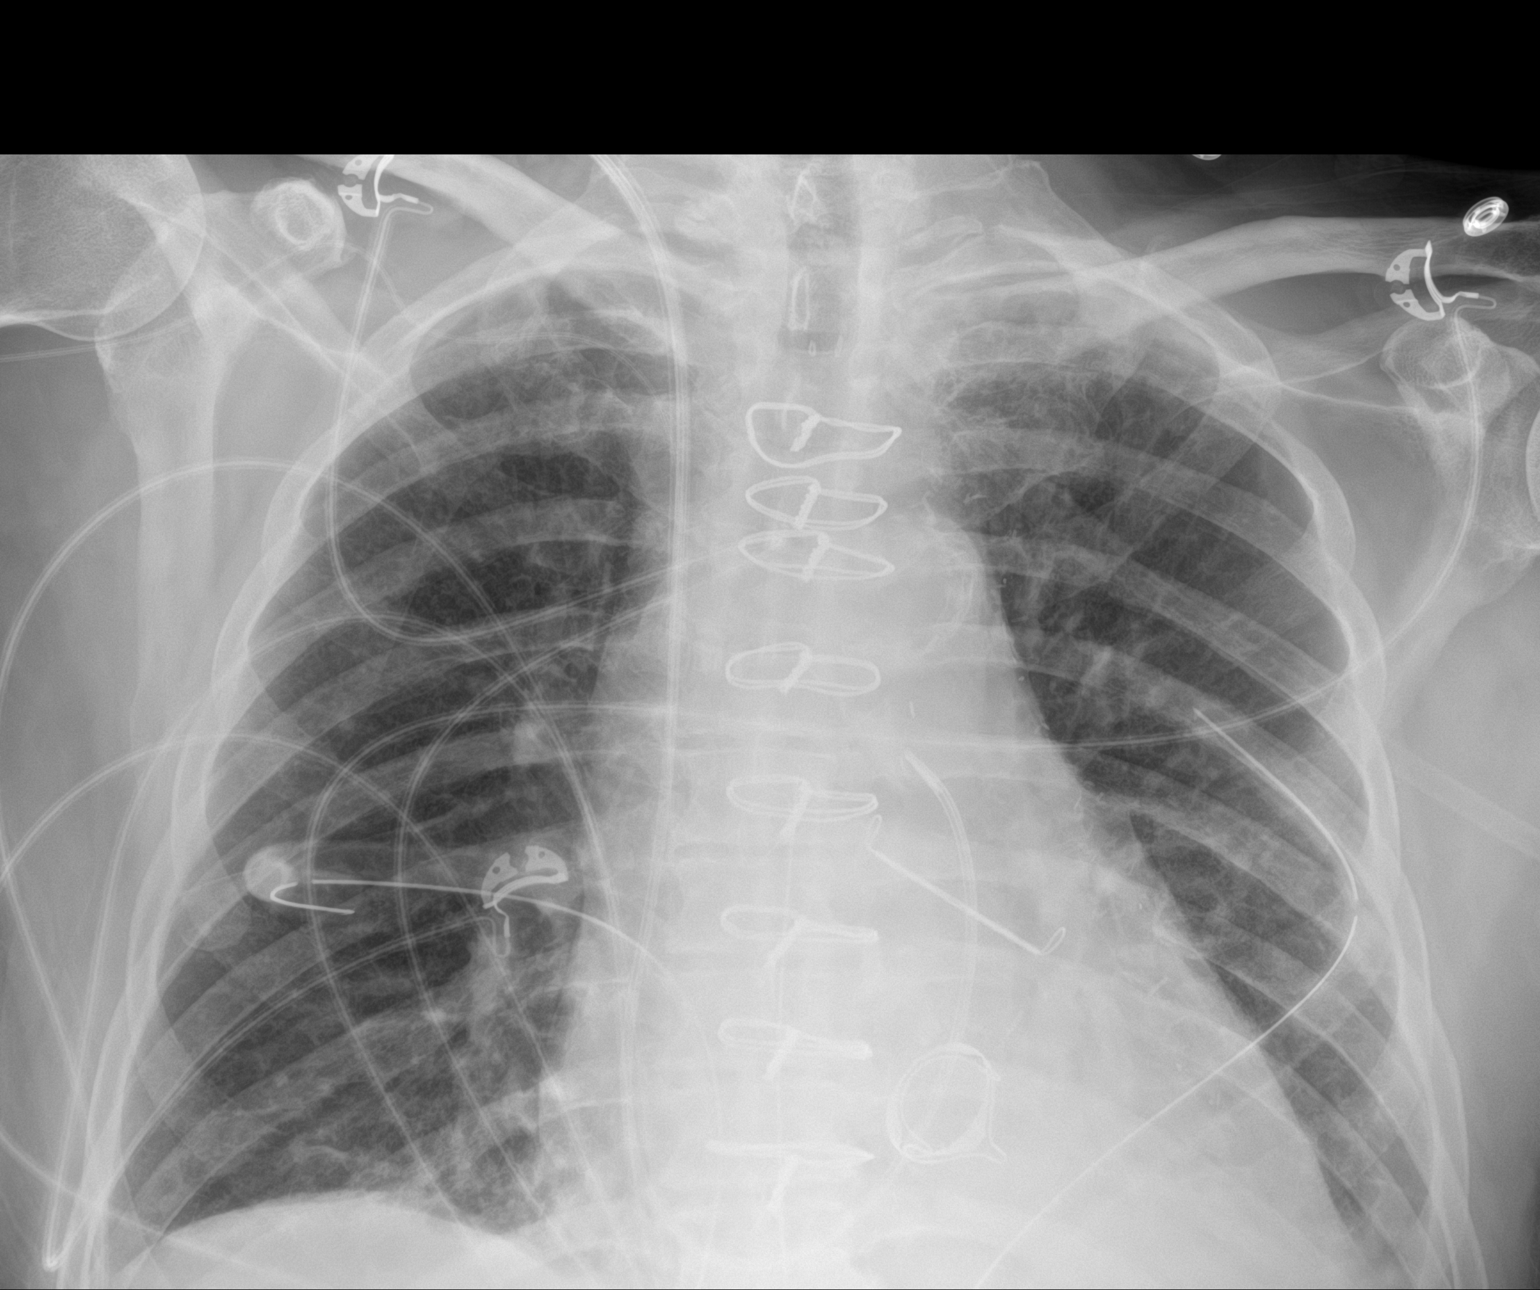

[chest ap (2 of 2)]
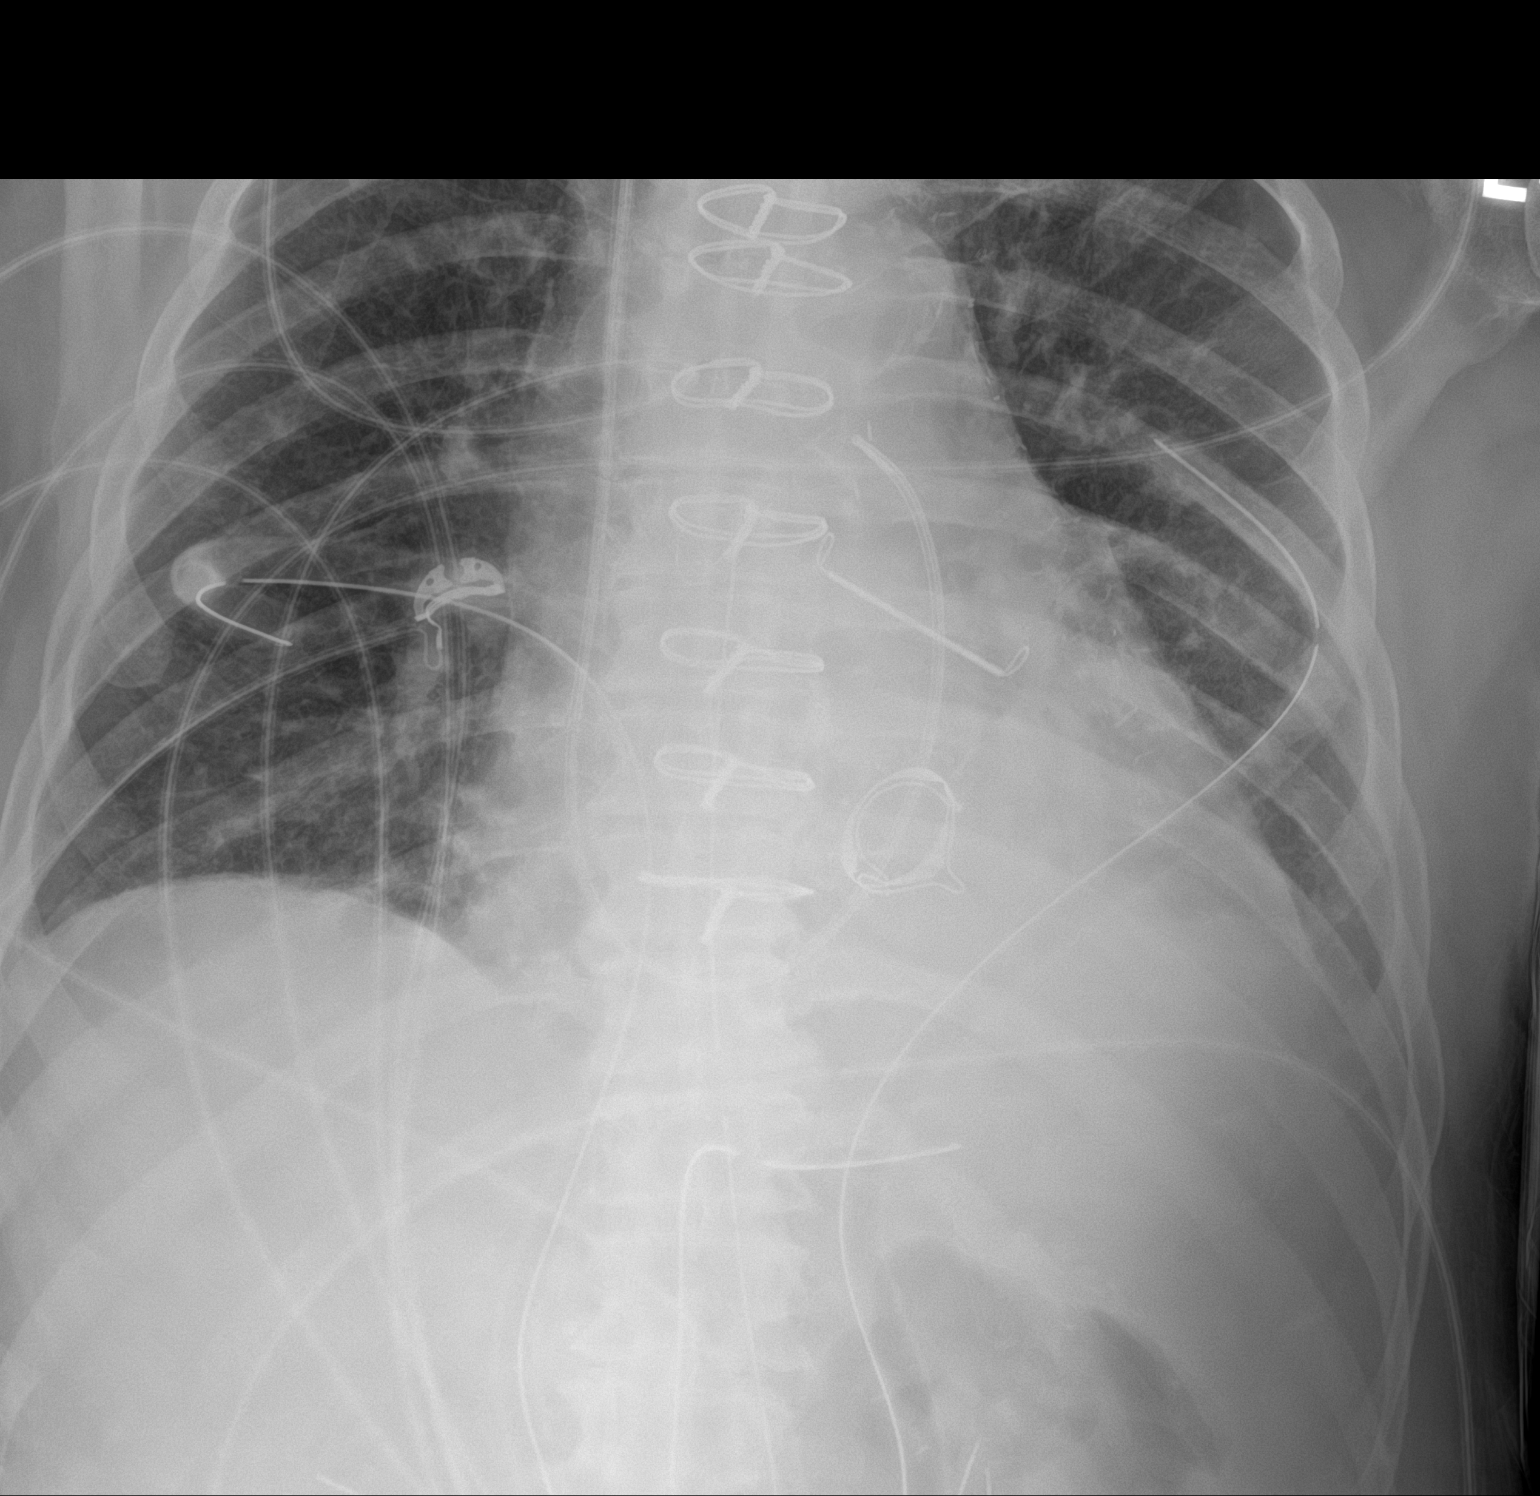

[2 of 2 positions shown; findings below may reference images not displayed]

FINDINGS: A right IJ vascular sheath conveys a Swan-Ganz catheter into the
heart. The tip of the Klever overlies the main pulmonary outflow
tract. Evidence of prior median sternotomy with aortic valve
replacement, multivessel CABG and left atrial ligation. Mediastinal
and bilateral chest tubes in place. No pneumothorax. No evidence of
pulmonary edema. Mild vascular congestion. Trace streaky bibasilar
airspace opacities likely reflecting atelectasis. Atherosclerotic
calcifications present in the transverse aorta.
IMPRESSION: 1. Stable and satisfactory support apparatus.
2. Stable cardiomegaly with vascular congestion but no edema and
mild bibasilar atelectasis.
